# Patient Record
Sex: Female | Born: 1935
Health system: Southern US, Community
[De-identification: ages and names within clinical notes are randomized; demographics above are authoritative.]

## PROBLEM LIST (undated history)

## (undated) DIAGNOSIS — J449 Chronic obstructive pulmonary disease, unspecified: Secondary | ICD-10-CM

## (undated) DIAGNOSIS — F32A Depression, unspecified: Secondary | ICD-10-CM

## (undated) DIAGNOSIS — F329 Major depressive disorder, single episode, unspecified: Secondary | ICD-10-CM

## (undated) DIAGNOSIS — I509 Heart failure, unspecified: Secondary | ICD-10-CM

## (undated) DIAGNOSIS — T4145XA Adverse effect of unspecified anesthetic, initial encounter: Secondary | ICD-10-CM

## (undated) DIAGNOSIS — K5730 Diverticulosis of large intestine without perforation or abscess without bleeding: Secondary | ICD-10-CM

## (undated) DIAGNOSIS — F0390 Unspecified dementia without behavioral disturbance: Secondary | ICD-10-CM

## (undated) DIAGNOSIS — T8859XA Other complications of anesthesia, initial encounter: Secondary | ICD-10-CM

## (undated) DIAGNOSIS — N189 Chronic kidney disease, unspecified: Secondary | ICD-10-CM

## (undated) DIAGNOSIS — I10 Essential (primary) hypertension: Secondary | ICD-10-CM

## (undated) DIAGNOSIS — E785 Hyperlipidemia, unspecified: Secondary | ICD-10-CM

## (undated) DIAGNOSIS — Z8489 Family history of other specified conditions: Secondary | ICD-10-CM

## (undated) DIAGNOSIS — H269 Unspecified cataract: Secondary | ICD-10-CM

## (undated) DIAGNOSIS — T7840XA Allergy, unspecified, initial encounter: Secondary | ICD-10-CM

## (undated) DIAGNOSIS — R55 Syncope and collapse: Secondary | ICD-10-CM

## (undated) DIAGNOSIS — M1990 Unspecified osteoarthritis, unspecified site: Secondary | ICD-10-CM

## (undated) HISTORY — DX: Diverticulosis of large intestine without perforation or abscess without bleeding: K57.30

## (undated) HISTORY — DX: Major depressive disorder, single episode, unspecified: F32.9

## (undated) HISTORY — DX: Essential (primary) hypertension: I10

## (undated) HISTORY — PX: EYE SURGERY: SHX253

## (undated) HISTORY — DX: Chronic kidney disease, unspecified: N18.9

## (undated) HISTORY — DX: Allergy, unspecified, initial encounter: T78.40XA

## (undated) HISTORY — DX: Syncope and collapse: R55

## (undated) HISTORY — DX: Chronic obstructive pulmonary disease, unspecified: J44.9

## (undated) HISTORY — DX: Hyperlipidemia, unspecified: E78.5

## (undated) HISTORY — DX: Heart failure, unspecified: I50.9

## (undated) HISTORY — DX: Depression, unspecified: F32.A

## (undated) HISTORY — DX: Unspecified cataract: H26.9

---

## 1975-08-23 HISTORY — PX: ABDOMINAL HYSTERECTOMY: SHX81

## 1996-02-20 ENCOUNTER — Encounter (INDEPENDENT_AMBULATORY_CARE_PROVIDER_SITE_OTHER): Payer: Self-pay | Admitting: *Deleted

## 1996-02-20 LAB — CONVERTED CEMR LAB

## 1998-01-28 ENCOUNTER — Encounter: Admission: RE | Admit: 1998-01-28 | Discharge: 1998-01-28 | Payer: Self-pay | Admitting: Family Medicine

## 1998-01-28 ENCOUNTER — Other Ambulatory Visit: Admission: RE | Admit: 1998-01-28 | Discharge: 1998-01-28 | Payer: Self-pay | Admitting: Family Medicine

## 1998-02-06 ENCOUNTER — Ambulatory Visit: Admission: RE | Admit: 1998-02-06 | Discharge: 1998-02-06 | Payer: Self-pay | Admitting: Family Medicine

## 1999-06-08 ENCOUNTER — Encounter: Admission: RE | Admit: 1999-06-08 | Discharge: 1999-06-08 | Payer: Self-pay | Admitting: Family Medicine

## 1999-07-14 ENCOUNTER — Encounter: Payer: Self-pay | Admitting: *Deleted

## 1999-07-14 ENCOUNTER — Encounter: Admission: RE | Admit: 1999-07-14 | Discharge: 1999-07-14 | Payer: Self-pay | Admitting: *Deleted

## 2001-05-17 ENCOUNTER — Encounter: Admission: RE | Admit: 2001-05-17 | Discharge: 2001-05-17 | Payer: Self-pay | Admitting: Sports Medicine

## 2001-05-23 ENCOUNTER — Encounter: Admission: RE | Admit: 2001-05-23 | Discharge: 2001-05-23 | Payer: Self-pay | Admitting: *Deleted

## 2001-05-23 ENCOUNTER — Encounter: Payer: Self-pay | Admitting: *Deleted

## 2001-05-31 ENCOUNTER — Encounter: Admission: RE | Admit: 2001-05-31 | Discharge: 2001-05-31 | Payer: Self-pay | Admitting: Family Medicine

## 2002-08-06 ENCOUNTER — Encounter: Admission: RE | Admit: 2002-08-06 | Discharge: 2002-08-06 | Payer: Self-pay | Admitting: Family Medicine

## 2002-08-07 ENCOUNTER — Encounter: Admission: RE | Admit: 2002-08-07 | Discharge: 2002-08-07 | Payer: Self-pay | Admitting: Family Medicine

## 2002-08-27 ENCOUNTER — Encounter: Admission: RE | Admit: 2002-08-27 | Discharge: 2002-08-27 | Payer: Self-pay | Admitting: *Deleted

## 2002-08-27 ENCOUNTER — Encounter: Payer: Self-pay | Admitting: *Deleted

## 2002-09-04 ENCOUNTER — Encounter: Admission: RE | Admit: 2002-09-04 | Discharge: 2002-09-04 | Payer: Self-pay | Admitting: Family Medicine

## 2002-09-10 ENCOUNTER — Encounter: Admission: RE | Admit: 2002-09-10 | Discharge: 2002-09-10 | Payer: Self-pay | Admitting: Family Medicine

## 2002-10-23 ENCOUNTER — Ambulatory Visit (HOSPITAL_COMMUNITY): Admission: RE | Admit: 2002-10-23 | Discharge: 2002-10-23 | Payer: Self-pay | Admitting: Gastroenterology

## 2002-10-23 ENCOUNTER — Encounter (INDEPENDENT_AMBULATORY_CARE_PROVIDER_SITE_OTHER): Payer: Self-pay | Admitting: *Deleted

## 2002-11-18 ENCOUNTER — Encounter: Admission: RE | Admit: 2002-11-18 | Discharge: 2002-11-18 | Payer: Self-pay | Admitting: Family Medicine

## 2003-03-07 ENCOUNTER — Encounter: Admission: RE | Admit: 2003-03-07 | Discharge: 2003-03-07 | Payer: Self-pay | Admitting: Family Medicine

## 2003-06-27 ENCOUNTER — Encounter: Admission: RE | Admit: 2003-06-27 | Discharge: 2003-06-27 | Payer: Self-pay | Admitting: Family Medicine

## 2003-11-28 ENCOUNTER — Encounter: Admission: RE | Admit: 2003-11-28 | Discharge: 2003-11-28 | Payer: Self-pay | Admitting: Family Medicine

## 2003-12-02 ENCOUNTER — Encounter: Admission: RE | Admit: 2003-12-02 | Discharge: 2003-12-02 | Payer: Self-pay | Admitting: Family Medicine

## 2003-12-08 ENCOUNTER — Encounter: Admission: RE | Admit: 2003-12-08 | Discharge: 2003-12-08 | Payer: Self-pay | Admitting: Sports Medicine

## 2003-12-25 ENCOUNTER — Encounter: Admission: RE | Admit: 2003-12-25 | Discharge: 2003-12-25 | Payer: Self-pay | Admitting: Family Medicine

## 2004-02-09 ENCOUNTER — Encounter: Admission: RE | Admit: 2004-02-09 | Discharge: 2004-02-09 | Payer: Self-pay | Admitting: Family Medicine

## 2004-02-20 ENCOUNTER — Encounter: Admission: RE | Admit: 2004-02-20 | Discharge: 2004-02-20 | Payer: Self-pay | Admitting: Family Medicine

## 2004-03-15 ENCOUNTER — Ambulatory Visit (HOSPITAL_COMMUNITY): Admission: RE | Admit: 2004-03-15 | Discharge: 2004-03-15 | Payer: Self-pay | Admitting: General Surgery

## 2004-03-15 ENCOUNTER — Ambulatory Visit (HOSPITAL_BASED_OUTPATIENT_CLINIC_OR_DEPARTMENT_OTHER): Admission: RE | Admit: 2004-03-15 | Discharge: 2004-03-15 | Payer: Self-pay | Admitting: General Surgery

## 2004-03-15 ENCOUNTER — Encounter (INDEPENDENT_AMBULATORY_CARE_PROVIDER_SITE_OTHER): Payer: Self-pay | Admitting: *Deleted

## 2004-08-11 ENCOUNTER — Ambulatory Visit: Payer: Self-pay | Admitting: Sports Medicine

## 2004-08-12 ENCOUNTER — Ambulatory Visit: Payer: Self-pay | Admitting: Sports Medicine

## 2004-08-22 DIAGNOSIS — R55 Syncope and collapse: Secondary | ICD-10-CM

## 2004-08-22 HISTORY — DX: Syncope and collapse: R55

## 2005-06-30 ENCOUNTER — Ambulatory Visit: Payer: Self-pay | Admitting: Family Medicine

## 2005-06-30 ENCOUNTER — Ambulatory Visit (HOSPITAL_COMMUNITY): Admission: RE | Admit: 2005-06-30 | Discharge: 2005-06-30 | Payer: Self-pay | Admitting: Family Medicine

## 2005-07-01 ENCOUNTER — Encounter: Admission: RE | Admit: 2005-07-01 | Discharge: 2005-07-01 | Payer: Self-pay | Admitting: Sports Medicine

## 2005-07-13 ENCOUNTER — Ambulatory Visit: Payer: Self-pay | Admitting: Family Medicine

## 2005-07-21 ENCOUNTER — Ambulatory Visit: Payer: Self-pay | Admitting: Sports Medicine

## 2006-08-16 ENCOUNTER — Encounter: Admission: RE | Admit: 2006-08-16 | Discharge: 2006-08-16 | Payer: Self-pay

## 2006-08-30 ENCOUNTER — Ambulatory Visit: Payer: Self-pay | Admitting: Family Medicine

## 2006-09-04 ENCOUNTER — Ambulatory Visit: Payer: Self-pay | Admitting: Family Medicine

## 2006-09-04 ENCOUNTER — Encounter (INDEPENDENT_AMBULATORY_CARE_PROVIDER_SITE_OTHER): Payer: Self-pay | Admitting: Family Medicine

## 2006-09-04 LAB — CONVERTED CEMR LAB
AST: 11 units/L (ref 0–37)
Albumin: 3.7 g/dL (ref 3.5–5.2)
Alkaline Phosphatase: 87 units/L (ref 39–117)
BUN: 22 mg/dL (ref 6–23)
Calcium: 9.2 mg/dL (ref 8.4–10.5)
Chloride: 104 meq/L (ref 96–112)
Glucose, Bld: 117 mg/dL — ABNORMAL HIGH (ref 70–99)
HDL: 57 mg/dL (ref >39)
LDL Cholesterol: 175 mg/dL — ABNORMAL HIGH (ref 0–99)
Potassium: 4.8 meq/L (ref 3.5–5.3)
Sodium: 141 meq/L (ref 135–145)
Total CHOL/HDL Ratio: 4.7
Total Protein: 6.5 g/dL (ref 6.0–8.3)
VLDL: 38 mg/dL (ref 0–40)

## 2006-09-29 ENCOUNTER — Ambulatory Visit: Payer: Self-pay | Admitting: Family Medicine

## 2006-10-19 DIAGNOSIS — D126 Benign neoplasm of colon, unspecified: Secondary | ICD-10-CM | POA: Insufficient documentation

## 2006-10-19 DIAGNOSIS — E1140 Type 2 diabetes mellitus with diabetic neuropathy, unspecified: Secondary | ICD-10-CM | POA: Insufficient documentation

## 2006-10-19 DIAGNOSIS — E1169 Type 2 diabetes mellitus with other specified complication: Secondary | ICD-10-CM | POA: Insufficient documentation

## 2006-10-19 DIAGNOSIS — E669 Obesity, unspecified: Secondary | ICD-10-CM

## 2006-10-19 DIAGNOSIS — E0843 Diabetes mellitus due to underlying condition with diabetic autonomic (poly)neuropathy: Secondary | ICD-10-CM

## 2006-10-19 DIAGNOSIS — L508 Other urticaria: Secondary | ICD-10-CM | POA: Insufficient documentation

## 2006-10-19 DIAGNOSIS — F339 Major depressive disorder, recurrent, unspecified: Secondary | ICD-10-CM

## 2006-10-19 DIAGNOSIS — I10 Essential (primary) hypertension: Secondary | ICD-10-CM

## 2006-10-19 DIAGNOSIS — E1159 Type 2 diabetes mellitus with other circulatory complications: Secondary | ICD-10-CM | POA: Insufficient documentation

## 2006-10-19 DIAGNOSIS — I152 Hypertension secondary to endocrine disorders: Secondary | ICD-10-CM | POA: Insufficient documentation

## 2006-10-19 DIAGNOSIS — E785 Hyperlipidemia, unspecified: Secondary | ICD-10-CM

## 2006-10-20 ENCOUNTER — Encounter (INDEPENDENT_AMBULATORY_CARE_PROVIDER_SITE_OTHER): Payer: Self-pay | Admitting: *Deleted

## 2006-12-04 ENCOUNTER — Ambulatory Visit: Payer: Self-pay | Admitting: Family Medicine

## 2007-03-19 ENCOUNTER — Telehealth (INDEPENDENT_AMBULATORY_CARE_PROVIDER_SITE_OTHER): Payer: Self-pay | Admitting: *Deleted

## 2007-04-16 ENCOUNTER — Ambulatory Visit: Payer: Self-pay | Admitting: Sports Medicine

## 2007-04-16 LAB — CONVERTED CEMR LAB: Hgb A1c MFr Bld: 6.4 %

## 2007-05-16 ENCOUNTER — Encounter: Admission: RE | Admit: 2007-05-16 | Discharge: 2007-05-16 | Payer: Self-pay | Admitting: Sports Medicine

## 2007-05-16 ENCOUNTER — Ambulatory Visit: Payer: Self-pay | Admitting: Family Medicine

## 2007-06-26 ENCOUNTER — Ambulatory Visit: Payer: Self-pay | Admitting: Family Medicine

## 2007-07-23 ENCOUNTER — Ambulatory Visit: Payer: Self-pay | Admitting: Family Medicine

## 2007-07-23 ENCOUNTER — Encounter (INDEPENDENT_AMBULATORY_CARE_PROVIDER_SITE_OTHER): Payer: Self-pay | Admitting: Family Medicine

## 2007-07-24 LAB — CONVERTED CEMR LAB
ALT: 10 units/L (ref 0–35)
AST: 13 units/L (ref 0–37)
Albumin: 3.9 g/dL (ref 3.5–5.2)
Alkaline Phosphatase: 67 units/L (ref 39–117)
BUN: 22 mg/dL (ref 6–23)
CO2: 22 meq/L (ref 19–32)
Calcium: 9.3 mg/dL (ref 8.4–10.5)
Chloride: 109 meq/L (ref 96–112)
Cholesterol: 195 mg/dL (ref 0–200)
Creatinine, Ser: 1.24 mg/dL — ABNORMAL HIGH (ref 0.40–1.20)
Glucose, Bld: 104 mg/dL — ABNORMAL HIGH (ref 70–99)
HDL: 46 mg/dL (ref >39)
LDL Cholesterol: 79 mg/dL (ref 0–99)
Potassium: 5 meq/L (ref 3.5–5.3)
Sodium: 139 meq/L (ref 135–145)
Total Bilirubin: 0.4 mg/dL (ref 0.3–1.2)
Total CHOL/HDL Ratio: 4.2
Total Protein: 6.9 g/dL (ref 6.0–8.3)
Triglycerides: 348 mg/dL — ABNORMAL HIGH (ref <150)
VLDL: 70 mg/dL — ABNORMAL HIGH (ref 0–40)

## 2007-07-27 ENCOUNTER — Ambulatory Visit: Payer: Self-pay | Admitting: Family Medicine

## 2007-08-23 HISTORY — PX: JOINT REPLACEMENT: SHX530

## 2007-10-29 ENCOUNTER — Encounter: Admission: RE | Admit: 2007-10-29 | Discharge: 2007-10-29 | Payer: Self-pay | Admitting: Family Medicine

## 2007-12-04 ENCOUNTER — Encounter (INDEPENDENT_AMBULATORY_CARE_PROVIDER_SITE_OTHER): Payer: Self-pay | Admitting: Family Medicine

## 2007-12-04 LAB — CONVERTED CEMR LAB
AST: 15 units/L
Albumin: 4.1 g/dL
Alkaline Phosphatase: 76 units/L
BUN: 25 mg/dL
CO2, serum: 21 mmol/L
Chloride, Serum: 108 mmol/L
Creatinine, Ser: 1.32 mg/dL
Glucose, Bld: 100 mg/dL
HCT: 39.6 %
MCV: 92.3 fL
Potassium, serum: 5.1 mmol/L
RDW: 13.7 %
Total Bilirubin: 0.3 mg/dL
Total Protein: 7.2 g/dL
WBC, blood: 10.6 10*3/uL
platelet count: 323 10*3/uL

## 2007-12-10 ENCOUNTER — Encounter (INDEPENDENT_AMBULATORY_CARE_PROVIDER_SITE_OTHER): Payer: Self-pay | Admitting: Family Medicine

## 2008-02-01 ENCOUNTER — Encounter (INDEPENDENT_AMBULATORY_CARE_PROVIDER_SITE_OTHER): Payer: Self-pay | Admitting: Family Medicine

## 2008-02-01 DIAGNOSIS — K5730 Diverticulosis of large intestine without perforation or abscess without bleeding: Secondary | ICD-10-CM | POA: Insufficient documentation

## 2008-02-11 ENCOUNTER — Ambulatory Visit: Payer: Self-pay | Admitting: Sports Medicine

## 2008-02-11 DIAGNOSIS — N184 Chronic kidney disease, stage 4 (severe): Secondary | ICD-10-CM

## 2008-02-11 LAB — CONVERTED CEMR LAB: Hgb A1c MFr Bld: 5.9 %

## 2008-02-12 ENCOUNTER — Ambulatory Visit: Payer: Self-pay | Admitting: Family Medicine

## 2008-02-12 ENCOUNTER — Encounter (INDEPENDENT_AMBULATORY_CARE_PROVIDER_SITE_OTHER): Payer: Self-pay | Admitting: Family Medicine

## 2008-02-13 ENCOUNTER — Encounter: Admission: RE | Admit: 2008-02-13 | Discharge: 2008-02-13 | Payer: Self-pay | Admitting: Family Medicine

## 2008-02-13 ENCOUNTER — Telehealth (INDEPENDENT_AMBULATORY_CARE_PROVIDER_SITE_OTHER): Payer: Self-pay | Admitting: Family Medicine

## 2008-02-13 ENCOUNTER — Ambulatory Visit: Payer: Self-pay | Admitting: Family Medicine

## 2008-02-13 ENCOUNTER — Ambulatory Visit (HOSPITAL_COMMUNITY): Admission: RE | Admit: 2008-02-13 | Discharge: 2008-02-13 | Payer: Self-pay | Admitting: Family Medicine

## 2008-02-13 ENCOUNTER — Encounter (INDEPENDENT_AMBULATORY_CARE_PROVIDER_SITE_OTHER): Payer: Self-pay | Admitting: *Deleted

## 2008-02-13 DIAGNOSIS — E875 Hyperkalemia: Secondary | ICD-10-CM

## 2008-02-14 LAB — CONVERTED CEMR LAB
BUN: 26 mg/dL — ABNORMAL HIGH (ref 6–23)
CO2: 20 meq/L (ref 19–32)
Calcium: 9.3 mg/dL (ref 8.4–10.5)
Chloride: 111 meq/L (ref 96–112)
Creatinine, Ser: 1.19 mg/dL (ref 0.40–1.20)
Potassium: 5.4 meq/L — ABNORMAL HIGH (ref 3.5–5.3)
Sodium: 141 meq/L (ref 135–145)

## 2008-02-18 LAB — CONVERTED CEMR LAB
ALT: <8 units/L (ref 0–35)
AST: 13 units/L (ref 0–37)
CO2: 19 meq/L (ref 19–32)
Calcium, Total (PTH): 9.1 mg/dL (ref 8.4–10.5)
Calcium: 9.1 mg/dL (ref 8.4–10.5)
Chloride: 114 meq/L — ABNORMAL HIGH (ref 96–112)
Cholesterol: 220 mg/dL — ABNORMAL HIGH (ref 0–200)
Magnesium: 1.6 mg/dL (ref 1.5–2.5)
Phosphorus: 3.6 mg/dL (ref 2.3–4.6)
Sodium: 141 meq/L (ref 135–145)
TSH: 1.929 microintl units/mL (ref 0.350–5.50)
Total Bilirubin: 0.3 mg/dL (ref 0.3–1.2)
Total Protein: 6.5 g/dL (ref 6.0–8.3)
VLDL: 42 mg/dL — ABNORMAL HIGH (ref 0–40)

## 2008-02-19 ENCOUNTER — Encounter (INDEPENDENT_AMBULATORY_CARE_PROVIDER_SITE_OTHER): Payer: Self-pay | Admitting: Family Medicine

## 2008-02-19 ENCOUNTER — Ambulatory Visit: Payer: Self-pay | Admitting: Family Medicine

## 2008-02-20 ENCOUNTER — Telehealth (INDEPENDENT_AMBULATORY_CARE_PROVIDER_SITE_OTHER): Payer: Self-pay | Admitting: *Deleted

## 2008-02-21 LAB — CONVERTED CEMR LAB
BUN: 21 mg/dL (ref 6–23)
CO2: 22 meq/L (ref 19–32)
Calcium: 9.5 mg/dL (ref 8.4–10.5)
Chloride: 110 meq/L (ref 96–112)
Creatinine, Ser: 1.24 mg/dL — ABNORMAL HIGH (ref 0.40–1.20)
Glucose, Bld: 131 mg/dL — ABNORMAL HIGH (ref 70–99)
Sodium: 141 meq/L (ref 135–145)
Vit D, 1,25-Dihydroxy: 19 — ABNORMAL LOW (ref 30–89)

## 2008-03-05 ENCOUNTER — Encounter (INDEPENDENT_AMBULATORY_CARE_PROVIDER_SITE_OTHER): Payer: Self-pay | Admitting: Family Medicine

## 2008-03-05 ENCOUNTER — Ambulatory Visit: Payer: Self-pay | Admitting: Family Medicine

## 2008-03-06 ENCOUNTER — Encounter (INDEPENDENT_AMBULATORY_CARE_PROVIDER_SITE_OTHER): Payer: Self-pay | Admitting: Family Medicine

## 2008-03-06 ENCOUNTER — Ambulatory Visit: Payer: Self-pay | Admitting: Family Medicine

## 2008-03-06 LAB — CONVERTED CEMR LAB
BUN: 16 mg/dL (ref 6–23)
CO2: 21 meq/L (ref 19–32)
Calcium: 9.4 mg/dL (ref 8.4–10.5)
Chloride: 108 meq/L (ref 96–112)
Creatinine, Ser: 1.06 mg/dL (ref 0.40–1.20)
Glucose, Bld: 91 mg/dL (ref 70–99)
Potassium: 5.7 meq/L — ABNORMAL HIGH (ref 3.5–5.3)
Sodium: 142 meq/L (ref 135–145)

## 2008-03-07 LAB — CONVERTED CEMR LAB
BUN: 16 mg/dL (ref 6–23)
CO2: 21 meq/L (ref 19–32)
Calcium: 9.4 mg/dL (ref 8.4–10.5)
Chloride: 103 meq/L (ref 96–112)
Creatinine, Ser: 1.09 mg/dL (ref 0.40–1.20)
Glucose, Bld: 100 mg/dL — ABNORMAL HIGH (ref 70–99)
Potassium: 5.2 meq/L (ref 3.5–5.3)
Sodium: 139 meq/L (ref 135–145)

## 2008-07-01 ENCOUNTER — Ambulatory Visit: Payer: Self-pay | Admitting: Family Medicine

## 2008-07-01 ENCOUNTER — Encounter (INDEPENDENT_AMBULATORY_CARE_PROVIDER_SITE_OTHER): Payer: Self-pay | Admitting: Family Medicine

## 2008-07-01 ENCOUNTER — Encounter: Payer: Self-pay | Admitting: Family Medicine

## 2008-07-01 ENCOUNTER — Encounter: Admission: RE | Admit: 2008-07-01 | Discharge: 2008-07-01 | Payer: Self-pay | Admitting: Family Medicine

## 2008-07-01 DIAGNOSIS — E559 Vitamin D deficiency, unspecified: Secondary | ICD-10-CM

## 2008-07-01 DIAGNOSIS — M25559 Pain in unspecified hip: Secondary | ICD-10-CM

## 2008-07-01 LAB — CONVERTED CEMR LAB: Hgb A1c MFr Bld: 6.1 %

## 2008-07-03 LAB — CONVERTED CEMR LAB
ALT: 8 units/L (ref 0–35)
AST: 14 units/L (ref 0–37)
Albumin: 4 g/dL (ref 3.5–5.2)
Alkaline Phosphatase: 79 units/L (ref 39–117)
BUN: 21 mg/dL (ref 6–23)
CO2: 19 meq/L (ref 19–32)
Calcium: 9.9 mg/dL (ref 8.4–10.5)
Chloride: 110 meq/L (ref 96–112)
Creatinine, Ser: 1.14 mg/dL (ref 0.40–1.20)
Glucose, Bld: 86 mg/dL (ref 70–99)
Potassium: 4.8 meq/L (ref 3.5–5.3)
Sodium: 140 meq/L (ref 135–145)
Total Bilirubin: 0.3 mg/dL (ref 0.3–1.2)
Total Protein: 7.4 g/dL (ref 6.0–8.3)

## 2008-07-04 ENCOUNTER — Telehealth (INDEPENDENT_AMBULATORY_CARE_PROVIDER_SITE_OTHER): Payer: Self-pay | Admitting: Family Medicine

## 2008-08-18 ENCOUNTER — Encounter (INDEPENDENT_AMBULATORY_CARE_PROVIDER_SITE_OTHER): Payer: Self-pay | Admitting: Family Medicine

## 2008-08-18 ENCOUNTER — Ambulatory Visit: Payer: Self-pay | Admitting: Family Medicine

## 2008-08-19 LAB — CONVERTED CEMR LAB: Direct LDL: 152 mg/dL — ABNORMAL HIGH

## 2008-10-23 ENCOUNTER — Ambulatory Visit (HOSPITAL_COMMUNITY): Admission: RE | Admit: 2008-10-23 | Discharge: 2008-10-23 | Payer: Self-pay | Admitting: Family Medicine

## 2008-10-23 ENCOUNTER — Encounter (INDEPENDENT_AMBULATORY_CARE_PROVIDER_SITE_OTHER): Payer: Self-pay | Admitting: Family Medicine

## 2008-10-23 ENCOUNTER — Ambulatory Visit: Payer: Self-pay | Admitting: Family Medicine

## 2008-10-23 DIAGNOSIS — R42 Dizziness and giddiness: Secondary | ICD-10-CM

## 2008-10-23 LAB — CONVERTED CEMR LAB
ALT: <8 units/L (ref 0–35)
AST: 12 units/L (ref 0–37)
Albumin: 4.3 g/dL (ref 3.5–5.2)
BUN: 25 mg/dL — ABNORMAL HIGH (ref 6–23)
CO2: 24 meq/L (ref 19–32)
Calcium: 9.9 mg/dL (ref 8.4–10.5)
Chloride: 105 meq/L (ref 96–112)
Creatinine, Ser: 1.35 mg/dL — ABNORMAL HIGH (ref 0.40–1.20)
Direct LDL: 140 mg/dL — ABNORMAL HIGH
Glucose, Bld: 96 mg/dL (ref 70–99)
Hgb A1c MFr Bld: 6 %
Potassium: 4.8 meq/L (ref 3.5–5.3)
Sodium: 142 meq/L (ref 135–145)
Total Bilirubin: 0.3 mg/dL (ref 0.3–1.2)
Total Protein: 7 g/dL (ref 6.0–8.3)

## 2008-10-24 ENCOUNTER — Encounter (INDEPENDENT_AMBULATORY_CARE_PROVIDER_SITE_OTHER): Payer: Self-pay | Admitting: Family Medicine

## 2008-10-27 ENCOUNTER — Ambulatory Visit: Payer: Self-pay | Admitting: Cardiology

## 2008-10-27 DIAGNOSIS — R55 Syncope and collapse: Secondary | ICD-10-CM

## 2008-10-27 DIAGNOSIS — R079 Chest pain, unspecified: Secondary | ICD-10-CM | POA: Insufficient documentation

## 2008-11-06 ENCOUNTER — Telehealth (INDEPENDENT_AMBULATORY_CARE_PROVIDER_SITE_OTHER): Payer: Self-pay

## 2008-11-10 ENCOUNTER — Ambulatory Visit: Payer: Self-pay

## 2008-11-10 ENCOUNTER — Ambulatory Visit: Payer: Self-pay | Admitting: Cardiology

## 2008-11-10 ENCOUNTER — Encounter: Payer: Self-pay | Admitting: Cardiology

## 2008-11-10 LAB — CONVERTED CEMR LAB
Cholesterol: 237 mg/dL — ABNORMAL HIGH (ref 0–200)
HDL: 60.2 mg/dL (ref >39.00)
TSH: 1.95 microintl units/mL (ref 0.35–5.50)
Total CHOL/HDL Ratio: 4
Triglycerides: 168 mg/dL — ABNORMAL HIGH (ref 0.0–149.0)

## 2008-11-18 ENCOUNTER — Encounter: Payer: Self-pay | Admitting: Cardiology

## 2008-11-18 ENCOUNTER — Ambulatory Visit: Payer: Self-pay | Admitting: Cardiology

## 2008-12-17 ENCOUNTER — Ambulatory Visit: Payer: Self-pay | Admitting: Family Medicine

## 2008-12-17 DIAGNOSIS — J309 Allergic rhinitis, unspecified: Secondary | ICD-10-CM | POA: Insufficient documentation

## 2009-01-01 ENCOUNTER — Telehealth: Payer: Self-pay | Admitting: Cardiology

## 2009-01-23 ENCOUNTER — Inpatient Hospital Stay (HOSPITAL_COMMUNITY): Admission: RE | Admit: 2009-01-23 | Discharge: 2009-01-26 | Payer: Self-pay | Admitting: Orthopedic Surgery

## 2009-06-05 ENCOUNTER — Encounter: Payer: Self-pay | Admitting: Family Medicine

## 2010-05-18 ENCOUNTER — Ambulatory Visit: Payer: Self-pay | Admitting: Family Medicine

## 2010-05-18 ENCOUNTER — Encounter: Payer: Self-pay | Admitting: Family Medicine

## 2010-05-18 DIAGNOSIS — R5383 Other fatigue: Secondary | ICD-10-CM

## 2010-05-18 DIAGNOSIS — R5381 Other malaise: Secondary | ICD-10-CM | POA: Insufficient documentation

## 2010-05-18 LAB — CONVERTED CEMR LAB: Hgb A1c MFr Bld: 6.4 %

## 2010-05-21 ENCOUNTER — Encounter: Payer: Self-pay | Admitting: Family Medicine

## 2010-05-21 LAB — CONVERTED CEMR LAB
ALT: 9 units/L (ref 0–35)
AST: 12 units/L (ref 0–37)
Albumin: 3.9 g/dL (ref 3.5–5.2)
BUN: 26 mg/dL — ABNORMAL HIGH (ref 6–23)
CO2: 26 meq/L (ref 19–32)
Calcium: 10 mg/dL (ref 8.4–10.5)
Chloride: 101 meq/L (ref 96–112)
Cholesterol: 289 mg/dL — ABNORMAL HIGH (ref 0–200)
Creatinine, Ser: 1.12 mg/dL (ref 0.40–1.20)
Glucose, Bld: 93 mg/dL (ref 70–99)
LDL Cholesterol: 171 mg/dL — ABNORMAL HIGH (ref 0–99)
MCHC: 32.7 g/dL (ref 30.0–36.0)
Platelets: 359 10*3/uL (ref 150–400)
RBC: 4.46 M/uL (ref 3.87–5.11)
Sodium: 140 meq/L (ref 135–145)
TSH: 2.563 microintl units/mL (ref 0.350–4.500)
Total Bilirubin: 0.2 mg/dL — ABNORMAL LOW (ref 0.3–1.2)
Total CHOL/HDL Ratio: 4.3
Total Protein: 6.4 g/dL (ref 6.0–8.3)
Triglycerides: 248 mg/dL — ABNORMAL HIGH (ref <150)
VLDL: 50 mg/dL — ABNORMAL HIGH (ref 0–40)
WBC: 10.4 10*3/uL (ref 4.0–10.5)

## 2010-05-31 ENCOUNTER — Telehealth: Payer: Self-pay | Admitting: Family Medicine

## 2010-06-07 ENCOUNTER — Encounter: Admission: RE | Admit: 2010-06-07 | Discharge: 2010-06-07 | Payer: Self-pay | Admitting: Orthopedic Surgery

## 2010-06-22 ENCOUNTER — Encounter (INDEPENDENT_AMBULATORY_CARE_PROVIDER_SITE_OTHER): Payer: Self-pay | Admitting: Pharmacist

## 2010-07-16 ENCOUNTER — Encounter: Admission: RE | Admit: 2010-07-16 | Discharge: 2010-07-16 | Payer: Self-pay | Admitting: Family Medicine

## 2010-07-26 ENCOUNTER — Encounter: Payer: Self-pay | Admitting: Family Medicine

## 2010-09-21 NOTE — Progress Notes (Signed)
  Medications Added CITALOPRAM HYDROBROMIDE 40 MG TABS (CITALOPRAM HYDROBROMIDE) 1 tablet by mouth daily       Phone Note Refill Request Call back at [phone] Message from:  Patient  Refills Requested: Medication #1:  CITALOPRAM HYDROBROMIDE 40 MG TABS 1 tablet by mouth daily - this is a new dose  Medication #2:  FENOFIBRATE 54 MG TABS 2 tablets by mouth with dinner daily - this is an increased dose  Medication #3:  METFORMIN HCL 1000 MG TABS Take 1 tablet by mouth twice a day  Medication #4:  TRAMADOL HCL 50 MG TABS 1/2 tablet by mouth at bedtime as needed for hip pain Please call to Pharmacy   Initial call taken by: Eusebio Friendly,  May 31, 2010 11:17 AM  Follow-up for Phone Call        will forward to MD. Follow-up by: Marcell Barlow RN,  May 31, 2010 11:51 AM    New/Updated Medications: CITALOPRAM HYDROBROMIDE 40 MG TABS (CITALOPRAM HYDROBROMIDE) 1 tablet by mouth daily Prescriptions: METFORMIN HCL 1000 MG TABS (METFORMIN HCL) Take 1 tablet by mouth twice a day  #180 x 4   Entered and Authorized by:   Suzanna Obey MD   Signed by:   Suzanna Obey MD on 06/01/2010   Method used:   Electronically to        [facility] (retail)       [place], [place]  [id number]       Ph: [phone]       Fax: [phone]   RxIDRF:[id number] CITALOPRAM HYDROBROMIDE 40 MG TABS (CITALOPRAM HYDROBROMIDE) 1 tablet by mouth daily  #90 x 4   Entered and Authorized by:   Suzanna Obey MD   Signed by:   Suzanna Obey MD on 06/01/2010   Method used:   Electronically to        [facility] (retail)       [place], [place]  [id number]       Ph: [phone]       Fax: [phone]   RxIDBP:[id number] TRAMADOL HCL 50 MG TABS (TRAMADOL HCL) 1/2 tablet by mouth at bedtime as needed for hip pain  #30 x 1   Entered and Authorized by:   Suzanna Obey MD   Signed by:   Suzanna Obey MD on  06/01/2010   Method used:   Electronically to        [facility] (retail)       [place], [place]  [id number]       Ph: [phone]       Fax: [phone]   RxIDMP:[id number] FENOFIBRATE 54 MG TABS (FENOFIBRATE) 2 tablets by mouth with dinner daily - this is an increased dose  #180 x 4   Entered and Authorized by:   Suzanna Obey MD   Signed by:   Suzanna Obey MD on 06/01/2010   Method used:   Electronically to        [facility] (retail)       [place], [place]  [id number]       Ph: [phone]       Fax: [phone]   RxIDPT:[id number]

## 2010-09-21 NOTE — Assessment & Plan Note (Signed)
Summary: f/u,tcb   Vital Signs:  Patient profile:   77 year old female Height:      63 inches Weight:      162.8 pounds Pulse rate:   88 / minute BP sitting:   139 / 74  (right arm) Cuff size:   regular  Vitals Entered By: Mauricia Area CMA, (May 18, 2010 9:02 AM) CC: f/up DM. PE and refill meds. Is Patient Diabetic? Yes Pain Assessment Patient in pain? yes     Location: right hip Intensity: 5   Primary Care Provider:  Suzanna Obey MD  CC:  f/up DM. PE and refill meds..  History of Present Illness: 77 yo here for f/u.  My first time meeting her.  Has not been to the office since 2010.  DIABETES Meds: Metformin 1000 BID Taking and tolerating? yes Blood sugars: highest 150 Hypoglycemic symptoms: no Visual problems: hx of detached retina 1 year ago and catarct removal. Monitoring feet: yes Numbness/Tingling:  no Last eye exam: regular eye care A1c: 6.4% Flu/PNA vaccines? Needs flu shot.  Depression:  feels it is at the "right level"  Has beenon the same dose for some time.    HYPERTENSION Meds: Taking and tolerating? yes Home BP's: no Chest Pain: no Dyspnea: no     Habits & Providers  Alcohol-Tobacco-Diet     Tobacco Status: quit > 6 months  Current Medications (verified): 1)  Bayer Childrens Aspirin 81 Mg Chew (Aspirin) .... Take 1 Tablet By Mouth Once A Day 2)  Citalopram Hydrobromide 40 Mg Tabs (Citalopram Hydrobromide) .Marland Kitchen.. 1 Tablet By Mouth Daily - This Is A New Dose 3)  Fenofibrate 54 Mg Tabs (Fenofibrate) .... 2 Tablets By Mouth With Dinner Daily - This Is An Increased Dose 4)  Metformin Hcl 1000 Mg Tabs (Metformin Hcl) .... Take 1 Tablet By Mouth Twice A Day 5)  Tramadol Hcl 50 Mg Tabs (Tramadol Hcl) .... 1/2 Tablet By Mouth At Bedtime As Needed For Hip Pain 6)  Vitamin D3 1000 Unit Caps (Cholecalciferol) .Marland Kitchen.. 1 Tablet By Mouth Daily 7)  One Touch Test Strips For Glucometer .... Use As Directed Two To Three Times A Day - Dispense 1  Box  Allergies: 1)  ! Mevacor 2)  ! Niacin 3)  * Statins PMH-FH-SH reviewed for relevance  Social History: Smoking Status:  quit > 6 months  Review of Systems      See HPI General:  Neg except as otherwise ntoed.Marland Kitchen  Physical Exam  General:  Well-developed, well-nourished, no acute distress.  Alert and oriented x 3.  Lungs:  Clear to auscultation bilaterally.  Normal work of breathing.  No wheezes, rales, or rhonchi.  Heart:  Regular rate and rhythm.  No murmur, rub, or gallop.  2+ Dorsalis Pedis pulses.  Abdomen:  Bowel sounds positive,abdomen soft and non-tender without masses, organomegaly or hernias noted. Extremities:  No clubbing, cyanosis, or edema.  Neurologic:  alert & oriented X3.     Impression & Recommendations:  Problem # 1:  DIABETES MELLITUS II, UNCOMPLICATED ([id number]) At goal.  Her updated medication list for this problem includes:    Bayer Childrens Aspirin 81 Mg Chew (Aspirin) .Marland Kitchen... Take 1 tablet by mouth once a day    Metformin Hcl 1000 Mg Tabs (Metformin hcl) .Marland Kitchen... Take 1 tablet by mouth twice a day  Orders: A1C-[facility] [id number]) [place]- Est  Level 4 [id number])  Problem # 2:  HYPERTENSION, BENIGN SYSTEMIC (ICD-401.1) Improved.  Minimally above goal.  Given patient info and  instruction to check ambulatroy BP and if continues to be above goal, to return to discuss  Orders: Comp Met-[facility] [phone]) CBC-[facility] [id number]) [place]- Est  Level 4 (99214)  BP today: 139/74 Prior BP: 161/83 (12/17/2008)  Prior 10 Yr Risk Heart Disease: 17 % (11/10/2008)  Labs Reviewed: K+: 4.8 (10/23/2008) Creat: : 1.35 (10/23/2008)   Chol: 237 (11/10/2008)   HDL: 60.20 (11/10/2008)   LDL: 126 (02/12/2008)   TG: 168.0 (11/10/2008)  Problem # 3:  VITAMIN D DEFICIENCY (ICD-268.9)  Will recheck  Orders: Vit D, 25 OH-[facility] [id number]) [place]- Est  Level 4 [id number])  Problem # 4:  HYPERLIPIDEMIA (ICD-272.4) statin intolerant.  Will rechekc lipids.  Her updated medication list for this  problem includes:    Fenofibrate 54 Mg Tabs (Fenofibrate) .Marland Kitchen... 2 tablets by mouth with dinner daily - this is an increased dose  Orders: Lipid-[facility] [id number]) [place]- Est  Level 4 [id number])  Complete Medication List: 1)  Bayer Childrens Aspirin 81 Mg Chew (Aspirin) .... Take 1 tablet by mouth once a day 2)  Citalopram Hydrobromide 40 Mg Tabs (Citalopram hydrobromide) .Marland Kitchen.. 1 tablet by mouth daily - this is a new dose 3)  Fenofibrate 54 Mg Tabs (Fenofibrate) .... 2 tablets by mouth with dinner daily - this is an increased dose 4)  Metformin Hcl 1000 Mg Tabs (Metformin hcl) .... Take 1 tablet by mouth twice a day 5)  Tramadol Hcl 50 Mg Tabs (Tramadol hcl) .... 1/2 tablet by mouth at bedtime as needed for hip pain 6)  Vitamin D3 1000 Unit Caps (Cholecalciferol) .Marland Kitchen.. 1 tablet by mouth daily 7)  One Touch Test Strips For Glucometer  .... Use as directed two to three times a day - dispense 1 box  Other Orders: TSH-[facility] [id number])  Patient Instructions: 1)  Your blood pressure goal is 130/80.  If you find your blood pressure is above this consistantly, please make follow-up to discuss. 2)  Remember to make your mammogram appt and flu shot. 3)  I will call you for unexpected results, or otherwise send you a letter in the mail.  if you do not hear anythign in 2 weeks, please give office a call. 4)  Follow-up in 1 year or sooner if needed.   Prevention & Chronic Care Immunizations   Influenza vaccine: Office supply out  (07/01/2008)   Influenza vaccine due: 07/01/2009    Tetanus booster: 02/20/2003: Done.   Tetanus booster due: 02/19/2013    Pneumococcal vaccine: Done.  (01/20/1997)   Pneumococcal vaccine due: None    H. zoster vaccine: 07/01/2008: Prescription Given  Colorectal Screening   Hemoccult: Done.  (08/22/2002)   Hemoccult due: Not Indicated    Colonoscopy: normal  (01/30/2008)   Colonoscopy due: 01/29/2018  Other Screening   Pap smear: Done.  (02/20/1996)   Pap smear  action/deferral: hysterectomy  (07/27/2007)   Pap smear due: Not Indicated    Mammogram: normal  (10/29/2007)   Mammogram due: 10/28/2008    DXA bone density scan: normal  (02/13/2008)   DXA scan due: None    Smoking status: quit > 6 months  (05/18/2010)  Diabetes Mellitus   HgbA1C: 6.4  (05/18/2010)   Hemoglobin A1C due: 01/23/2009    Eye exam: normal  (12/21/2007)   Eye exam due: 12/20/2008    Foot exam: yes  (07/01/2008)   High risk foot: Not documented   Foot care education: completed  (07/01/2008)   Foot exam due: 07/01/2009    Urine microalbumin/creatinine ratio: Not documented  Urine microalbumin/cr due: 02/10/2009    Diabetes flowsheet reviewed?: Yes   Progress toward A1C goal: At goal  Lipids   Total Cholesterol: 237  (11/10/2008)   LDL: 126  (02/12/2008)   LDL Direct: 137.6  (11/10/2008)   HDL: 60.20  (11/10/2008)   Triglycerides: 168.0  (11/10/2008)    SGOT (AST): 12  (10/23/2008)   SGPT (ALT): <8 U/L  (10/23/2008) CMP ordered    Alkaline phosphatase: 45  (10/23/2008)   Total bilirubin: 0.3  (10/23/2008)    Lipid flowsheet reviewed?: Yes   Progress toward LDL goal: Unchanged  Hypertension   Last Blood Pressure: 139 / 74  (05/18/2010)   Serum creatinine: 1.35  (10/23/2008)   Serum potassium 4.8  (10/23/2008) CMP ordered     Hypertension flowsheet reviewed?: Yes   Progress toward BP goal: Improved  Self-Management Support :   Personal Goals (by the next clinic visit) :     Personal A1C goal: 7  (05/18/2010)     Personal blood pressure goal: 130/80  (05/18/2010)   Patient will work on the following items until the next clinic visit to reach self-care goals:     Medications and monitoring: take my medicines every day  (05/18/2010)    Diabetes self-management support: Not documented    Hypertension self-management support: Education handout  (05/18/2010)   Hypertension education handout printed    Lipid self-management support: Education  handout  (05/18/2010)     Lipid education handout printed  Laboratory Results   Blood Tests   Date/Time Received: May 18, 2010 9:09 AM  Date/Time Reported: May 18, 2010 9:34 AM   HGBA1C: 6.4%   (Normal Range: Non-Diabetic - 3-6%   Control Diabetic - 6-8%)  Comments: ...........test performed by...........Marland KitchenHedy Camara, CMA

## 2010-09-21 NOTE — Miscellaneous (Signed)
Summary: Orders Update   Clinical Lists Changes  Problems: Added new problem of ENCOUNTER FOR LONG-TERM USE OF OTHER MEDICATIONS (ICD-V58.69) Orders: Added new Test order of B12-[facility] [id number]) - Signed Added new Test order of CBC-[facility] [id number]) - Signed  OK per Dr. Doreene Nest

## 2010-09-21 NOTE — Letter (Signed)
Summary: Lipid Letter  [facility]  [address]   [place], [place] [address]   Phone: [phone]  Fax: [phone]    05/21/2010  Jill [name] [address], [place]  [id number]  Dear Ms. Vonna Drafts:  We have carefully reviewed your last lipid profile from 05/18/2010 and the results are noted below with a summary of recommendations for lipid management.    Cholesterol:       289     Goal: <200   HDL "good" Cholesterol:   68     Goal: >50   LDL "bad" Cholesterol:   171     Goal: <130   Triglycerides:       248     Goal: <150    You are intolerant to many of the medicatiosn we use to treat cholesterol.  It is thus eve mroe important to take your fenofibrate and work on TLC as noted below.  I'd be glad to discuss your cholesterol management in further detail in the office at your convenience.    TLC Diet (Therapeutic Lifestyle Change): Saturated Fats & Transfatty acids should be kept < 7% of total calories ***Reduce Saturated Fats Polyunstaurated Fat can be up to 10% of total calories Monounsaturated Fat Fat can be up to 20% of total calories Total Fat should be no greater than 25-35% of total calories Carbohydrates should be 50-60% of total calories Protein should be approximately 15% of total calories Fiber should be at least 20-30 grams a day ***Increased fiber may help lower LDL Total Cholesterol should be < 200mg /day Consider adding plant stanol/sterols to diet (example: Benacol spread) ***A higher intake of unsaturated fat may reduce Triglycerides and Increase HDL    Adjunctive Measures (may lower LIPIDS and reduce risk of Heart Attack) include: Aerobic Exercise (20-30 minutes 3-4 times a week) Limit Alcohol Consumption Weight Reduction Aspirin 75-81 mg a day by mouth (if not allergic or contraindicated) Dietary Fiber 20-30 grams a day by mouth    Current Medications: 1)    Bayer Childrens Aspirin 81 Mg Chew (Aspirin) .... Take 1 tablet by  mouth once a day 2)    Citalopram Hydrobromide 40 Mg Tabs (Citalopram hydrobromide) .Marland Kitchen.. 1 tablet by mouth daily - this is a new dose 3)    Fenofibrate 54 Mg Tabs (Fenofibrate) .... 2 tablets by mouth with dinner daily - this is an increased dose 4)    Metformin Hcl 1000 Mg Tabs (Metformin hcl) .... Take 1 tablet by mouth twice a day 5)    Tramadol Hcl 50 Mg Tabs (Tramadol hcl) .... 1/2 tablet by mouth at bedtime as needed for hip pain 6)    Vitamin D3 1000 Unit Caps (Cholecalciferol) .Marland Kitchen.. 1 tablet by mouth daily 7)    One Touch Test Strips For Glucometer  .... Use as directed two to three times a day - dispense 1 box  If you have any questions, please call. We appreciate being able to work with you.   Sincerely,    [facility] Suzanna Obey MD  Appended Document: Lipid Letter mailed

## 2010-09-23 NOTE — Consult Note (Signed)
Summary: [facility]: no retinopathy, left cataract  [facility]   Imported By: Audie Clear 08/12/2010 16:31:06  _____________________________________________________________________  External Attachment:    Type:   Image     Comment:   External Document  Appended Document: [facility]: no retinopathy, left cataract     Clinical Lists Changes  Observations: Added new observation of PAST MED HX: - CKD stage 3 - iPTH normal, Phos, Mag normal, Vit D low 6/09 - Protein electrophoresis negative - 11/21/2003 - Depression major recurrent - HTN - Hyperlipidemia ( LDL 174 on 07/08!! , up to 220 in the past 2 to [place])  - DM - Dermatographism - hives (followed by [facility] 02/2003) - rhinitis allergic - Skin bx:  superficial and deep perivascular and interstitial dermatitis with flame fetures - 11/21/2003 ESR 35, ANA +, ANA titer 1:1280 , Anti-dsDNA neg, Smith ab neg, Sjogrens Ab neg - 11/21/2003 - Cardiolyte: low risk, EF 74% - 07/28/2005 - Colonoscopy: 3 sm sessile polyps snared - 3/1/200 -L side diverticulosis, int hem - 10/21/2002 - h/o plantar fasciitis, - hx of  nipple discharge sent to [facility] - hx of subareolar mass - Hx of Tobacco abuse (Quit smoking on 09/02)  - left cataract (08/16/2010 11:05) Added new observation of PRIMARY MD: Suzanna Obey MD (08/16/2010 11:05)       Prevention & Chronic Care Immunizations   Influenza vaccine: Office supply out  (07/01/2008)   Influenza vaccine due: 07/01/2009    Tetanus booster: 02/20/2003: Done.   Tetanus booster due: 02/19/2013    Pneumococcal vaccine: Done.  (01/20/1997)   Pneumococcal vaccine due: None    H. zoster vaccine: 07/01/2008: Prescription Given  Colorectal Screening   Hemoccult: Done.  (08/22/2002)   Hemoccult due: Not Indicated    Colonoscopy: normal  (01/30/2008)   Colonoscopy due: 01/29/2018  Other Screening   Pap smear: Done.  (02/20/1996)   Pap smear action/deferral:  hysterectomy  (07/27/2007)   Pap smear due: Not Indicated    Mammogram: ASSESSMENT: Negative - BI-RADS 1^MM DIGITAL SCREENING  (07/16/2010)   Mammogram due: 10/28/2008    DXA bone density scan: normal  (02/13/2008)   DXA scan due: None    Smoking status: quit > 6 months  (05/18/2010)  Diabetes Mellitus   HgbA1C: 6.4  (05/18/2010)   Hemoglobin A1C due: 01/23/2009    Eye exam: normal  (12/21/2007)   Eye exam due: 12/20/2008    Foot exam: yes  (07/01/2008)   High risk foot: Not documented   Foot care education: completed  (07/01/2008)   Foot exam due: 07/01/2009    Urine microalbumin/creatinine ratio: Not documented   Urine microalbumin/cr due: 02/10/2009  Lipids   Total Cholesterol: 289  (05/18/2010)   LDL: 171  (05/18/2010)   LDL Direct: 137.6  (11/10/2008)   HDL: 68  (05/18/2010)   Triglycerides: 248  (05/18/2010)    SGOT (AST): 12  (05/18/2010)   SGPT (ALT): 9  (05/18/2010)   Alkaline phosphatase: 54  (05/18/2010)   Total bilirubin: 0.2  (05/18/2010)  Hypertension   Last Blood Pressure: 139 / 74  (05/18/2010)   Serum creatinine: 1.12  (05/18/2010)   Serum potassium 4.6  (05/18/2010)  Self-Management Support :   Personal Goals (by the next clinic visit) :     Personal A1C goal: 7  (05/18/2010)     Personal blood pressure goal: 130/80  (05/18/2010)   Diabetes self-management support: Not documented    Hypertension self-management support: Education handout  (05/18/2010)  Lipid self-management support: Education handout  (05/18/2010)      Past History:  Past Medical History: - CKD stage 3 - iPTH normal, Phos, Mag normal, Vit D low 6/09 - Protein electrophoresis negative - 11/21/2003 - Depression major recurrent - HTN - Hyperlipidemia ( LDL 174 on 07/08!! , up to 220 in the past 2 to [place])  - DM - Dermatographism - hives (followed by [facility] 02/2003) - rhinitis allergic - Skin bx:  superficial and deep perivascular and interstitial  dermatitis with flame fetures - 11/21/2003 ESR 35, ANA +, ANA titer 1:1280 , Anti-dsDNA neg, Smith ab neg, Sjogrens Ab neg - 11/21/2003 - Cardiolyte: low risk, EF 74% - 07/28/2005 - Colonoscopy: 3 sm sessile polyps snared - 3/1/200 -L side diverticulosis, int hem - 10/21/2002 - h/o plantar fasciitis, - hx of  nipple discharge sent to [facility] - hx of subareolar mass - Hx of Tobacco abuse (Quit smoking on 09/02)  - left cataract

## 2010-11-29 LAB — URINE CULTURE
Colony Count: NO GROWTH
Culture: NO GROWTH

## 2010-11-29 LAB — GLUCOSE, CAPILLARY
Glucose-Capillary: 109 mg/dL — ABNORMAL HIGH (ref 70–99)
Glucose-Capillary: 117 mg/dL — ABNORMAL HIGH (ref 70–99)
Glucose-Capillary: 122 mg/dL — ABNORMAL HIGH (ref 70–99)
Glucose-Capillary: 122 mg/dL — ABNORMAL HIGH (ref 70–99)
Glucose-Capillary: 124 mg/dL — ABNORMAL HIGH (ref 70–99)
Glucose-Capillary: 131 mg/dL — ABNORMAL HIGH (ref 70–99)
Glucose-Capillary: 132 mg/dL — ABNORMAL HIGH (ref 70–99)
Glucose-Capillary: 133 mg/dL — ABNORMAL HIGH (ref 70–99)
Glucose-Capillary: 142 mg/dL — ABNORMAL HIGH (ref 70–99)
Glucose-Capillary: 144 mg/dL — ABNORMAL HIGH (ref 70–99)
Glucose-Capillary: 99 mg/dL (ref 70–99)

## 2010-11-29 LAB — CBC
HCT: 23.8 % — ABNORMAL LOW (ref 36.0–46.0)
HCT: 26.8 % — ABNORMAL LOW (ref 36.0–46.0)
Hemoglobin: 8.2 g/dL — ABNORMAL LOW (ref 12.0–15.0)
Hemoglobin: 8.7 g/dL — ABNORMAL LOW (ref 12.0–15.0)
MCHC: 33.7 g/dL (ref 30.0–36.0)
MCHC: 33.8 g/dL (ref 30.0–36.0)
MCHC: 34.2 g/dL (ref 30.0–36.0)
MCHC: 34.5 g/dL (ref 30.0–36.0)
MCV: 90.6 fL (ref 78.0–100.0)
Platelets: 206 10*3/uL (ref 150–400)
Platelets: 225 10*3/uL (ref 150–400)
Platelets: 344 10*3/uL (ref 150–400)
RBC: 2.61 MIL/uL — ABNORMAL LOW (ref 3.87–5.11)
RBC: 4.11 MIL/uL (ref 3.87–5.11)
RDW: 13 % (ref 11.5–15.5)
RDW: 13.5 % (ref 11.5–15.5)
RDW: 13.5 % (ref 11.5–15.5)
RDW: 13.6 % (ref 11.5–15.5)

## 2010-11-29 LAB — URINALYSIS, ROUTINE W REFLEX MICROSCOPIC
Hgb urine dipstick: NEGATIVE
Ketones, ur: 15 mg/dL — AB
Ketones, ur: NEGATIVE mg/dL
Protein, ur: NEGATIVE mg/dL
Protein, ur: NEGATIVE mg/dL
Urobilinogen, UA: 0.2 mg/dL (ref 0.0–1.0)
Urobilinogen, UA: 1 mg/dL (ref 0.0–1.0)

## 2010-11-29 LAB — BASIC METABOLIC PANEL
BUN: 17 mg/dL (ref 6–23)
BUN: 26 mg/dL — ABNORMAL HIGH (ref 6–23)
CO2: 26 mEq/L (ref 19–32)
CO2: 28 mEq/L (ref 19–32)
Chloride: 106 mEq/L (ref 96–112)
Creatinine, Ser: 1.45 mg/dL — ABNORMAL HIGH (ref 0.4–1.2)
GFR calc non Af Amer: 37 mL/min — ABNORMAL LOW (ref >60)
Glucose, Bld: 155 mg/dL — ABNORMAL HIGH (ref 70–99)
Glucose, Bld: 92 mg/dL (ref 70–99)
Potassium: 4.4 mEq/L (ref 3.5–5.1)

## 2010-11-29 LAB — PROTIME-INR
INR: 0.9 (ref 0.00–1.49)
INR: 1.7 — ABNORMAL HIGH (ref 0.00–1.49)
INR: 2.1 — ABNORMAL HIGH (ref 0.00–1.49)
Prothrombin Time: 12.7 seconds (ref 11.6–15.2)
Prothrombin Time: 21 seconds — ABNORMAL HIGH (ref 11.6–15.2)

## 2010-11-29 LAB — ABO/RH: ABO/RH(D): B POS

## 2010-11-29 LAB — DIFFERENTIAL
Basophils Absolute: 0.1 10*3/uL (ref 0.0–0.1)
Basophils Relative: 1 % (ref 0–1)
Eosinophils Absolute: 0.1 10*3/uL (ref 0.0–0.7)
Lymphs Abs: 3.6 10*3/uL (ref 0.7–4.0)
Neutrophils Relative %: 59 % (ref 43–77)

## 2010-11-29 LAB — TYPE AND SCREEN
ABO/RH(D): B POS
Antibody Screen: NEGATIVE

## 2011-01-04 NOTE — Op Note (Signed)
NAMETRECA, TALLMADGE NO.:  [id number]   MEDICAL RECORD NO.:  [id number]          PATIENT TYPE:  INP   LOCATION:  5031                         FACILITY:  [facility]   PHYSICIAN:  Kathalene Frames. Mayer Camel, M.D.   DATE OF BIRTH:  May 21, 1932   DATE OF PROCEDURE:  01/23/2009  DATE OF DISCHARGE:                               OPERATIVE REPORT   PREOPERATIVE DIAGNOSIS:  End-stage arthritis right hip.   POSTOPERATIVE DIAGNOSIS:  End-stage arthritis right hip.   PROCEDURE:  Right total hip arthroplasty using a 48-mm DePuy ASR cup NK  +0 43-mm ultimate head, 16 x 11 x 36 +6 offset stem, 16 D small cone.   SURGEON:  Kathalene Frames.  Mayer Camel, MD   FIRST ASSISTANT:  Leafy Kindle, PA-C   ANESTHETIC:  General endotracheal.   ESTIMATED BLOOD LOSS:  300 mL.   FLUID REPLACEMENT:  1500 mL of crystalloid.   DRAINS PLACED:  Foley catheter.   URINE OUTPUT:  300 mL.   INDICATIONS FOR PROCEDURE:  A 77 year old woman with end-stage arthritis  of the right hip, bone-on-bone arthritic changes by x-ray with  subchondral cystic changes to the acetabulum.  She has failed  conservative treatment, anti-inflammatory medicines, attempts at weight  loss, use of a cane and judicious use of narcotic.  She desires elective  right total hip arthroplasty to decrease pain and increase function.  Risks and benefits of surgery were discussed and questions answered.   DESCRIPTION OF PROCEDURE:  The patient was identified by armband and  taken to the holding area at [facility].  She received  preoperative IV antibiotics and then was taken to the operating room for  the appropriate anesthetic monitors were attached and general  endotracheal anesthesia induced with the patient in supine position.  Foley catheter was inserted.  She was rolled into a left lateral  decubitus position fixed there with a Stulberg Mark II pelvic clamp and  right lower extremity prepped and draped in usual sterile fashion from  the ankle to the hemipelvis.  The skin along the lateral hip and thigh  was infiltrated with 10 mL of 0.50% Marcaine and epinephrine solution  after performing a standard time-out procedure.  We then made a  posterolateral approach to the hip joint centered over the greater  trochanter incision about 15 cm in length through skin and subcutaneous  tissue down to the level of the IT band which was cut along with the  skin incision exposing the greater trochanter.  Cobra retractors were  placed between the gluteus minimus and the superior hip joint capsule  and quadratus femoris and inferior hip joint capsule isolating the  piriformis and short external rotators which were then cut off their  insertion on the intertrochanteric crest.  This exposed posterior aspect  of the hip joint capsule which was likewise developed into an acetabular  based flap going from posterior-superior off the acetabulum out over the  neck of the femur and then out posterior and inferior to the edge of the  acetabulum.  This flap was tagged to #2 Ethibond sutures as  well.  50%  of the rectus femoris was then released.  Hip was flexed and internally  rotated dislocating the femoral head and a standard neck cut performed  one fingerbreadth above the lesser trochanter.  The proximal femur was  then translated anteriorly levering off the anterior column with a  Hohmann retractor, a Cobra retractor was placed in the cotyloid notch  and a posterior inferior wing retractor placed at the junction of the  ischium and the acetabulum.  This allowed excision of the labrum with  the electrocautery and we then sequentially reamed the acetabulum up to  47-mm basket reamer and the edge with a 48-mm basket reamer obtaining  good coverage in all quadrants.  The acetabulum was then irrigated out  with normal saline solution and we hammered into place a 48-mm ASR cup  and 45 degrees of abduction and about 20 degrees of anteversion.   Excellent fit and fill was accomplished and the cup was firmly fixed.  It was then flexed internally, rotated exposing the proximal femur and  we sequentially reamed the proximal femur with axial reamers starting  out with an 8 and reaming up to a 11 where we obtained good chatter,  went to 11:05 and then part way down with the 12.  We then conically  reamed to a 16 D cone and milled the calcar to a 16 D small calcar  obtaining good fill of the cone.  At this point, the femur was irrigated  out with normal saline solution.  A trial 16 D small cone was hammered  into place.  We trimmed the neck of the femur to accommodate the edges  of the cone.  An 11 trial stem was then inserted with a 36 neck, +6  offset and an NK +0 trial head in the same version as the calcar which  was about 15 degrees of anteversion.  Hip was then reduced and taken  through range of motion.  It could flex to 90 with 60 of internal  rotation.  The hip could not be dislocated in extension and external  rotation and shuck test was negative.  The knee could be bent to 120  degrees with the hip in full extension.  At this point, the trial  components were removed and the wound irrigated out one more time with  normal saline solution.  We then selected a 16 D small ZTT1 cone which  was hammered into place followed by 16 x 11 x 36 with 6 offset stem, S-  ROM neck and this was hammered into place in the same version as the  calcar.  An NK +0 43-mm ultimate head was then hammered onto the stem  after drying the trunnion and the hip again reduced, stability checked  and found to be excellent.  The wound was then irrigated out with normal  saline solution.  The capsular flap and short external rotators were  repaired back to the intertrochanteric crest through drill holes.  The  IT band closed with running #1 Vicryl suture, the subcutaneous tissue  with 0 and 2-0 undyed Vicryl suture and the skin with running  interlocking 3-0  nylon suture.  A dressing of Xeroform and Mepilex was  then applied.  The patient was unclamped, rolled supine, awakened and  taken to recovery room without difficulty.      Kathalene Frames. Mayer Camel, M.D.  Electronically Signed     FJR/MEDQ  D:  01/23/2009  T:  01/24/2009  Job:  [id number] 

## 2011-01-07 NOTE — Op Note (Signed)
NAME:  Jill [name], Jill [name]                          ACCOUNT NO.:  [id number]   MEDICAL RECORD NO.:  [id number]                   PATIENT TYPE:  AMB   LOCATION:  ENDO                                 FACILITY:  [facility]   PHYSICIAN:  Nelwyn Salisbury, M.D.               DATE OF BIRTH:  October 05, 1931   DATE OF PROCEDURE:  10/23/2002  DATE OF DISCHARGE:                                 OPERATIVE REPORT   PROCEDURE:  Colonoscopy with snare polypectomy x3.   ENDOSCOPIST:  Nelwyn Salisbury, M.D.   INSTRUMENT USED:  Olympus video colonoscope changed to a pediatric  adjustable colonoscope.   INDICATIONS FOR PROCEDURE:  A 77 year old white female with a history of a  polyp at 10 cm seen on flexible sigmoidoscopy, rule out other colonic  polyps, polypectomy planned.   PREPROCEDURE PREPARATION:  Informed consent was procured from the patient.  The patient was fasted for eight hours prior to the procedure and prepped  with a bottle of MiraLax and Gatorade the night prior to the procedure.   PREPROCEDURE PHYSICAL:  VITAL SIGNS: Stable.  NECK:  Supple.  CHEST:  Clear to auscultation.  S1 and S2 regular.  ABDOMEN:  Soft with normal bowel sounds.   DESCRIPTION OF PROCEDURE:  The patient was placed in the left lateral  decubitus position and sedated with 100 mg of Demerol and 10 mg of Versed  intravenously.  Once the patient was adequately sedated and maintained on  low flow oxygen and continuous cardiac monitoring, the Olympus video  colonoscope was advanced from the rectum to about 20 cm.  Three sessile  polyps were removed from the rectum with snare polypectomy.  Small internal  hemorrhoids were seen on retroflexion in the rectum. The scope could not be  advanced beyond 20 cm and therefore, this was changed to an adjustable  pediatric scope which was advanced up to the cecum without difficulty.  The  patient had extensive left-sided diverticulosis of the transverse colon.  Right colon and cecum  appeared normal.   IMPRESSION:  1. Three small sessile polyps snared from the rectum.  2. Small nonbleeding internal hemorrhoids.  3. Extensive left-sided diverticulosis.  4. Normal appearing transverse colon, right colon, and cecum.  5. Some residual stool in the colon, very small lesions could have been     missed.    RECOMMENDATIONS:  Await pathology.  Avoid all nonsteroidals including  aspirin.  High fiber diet with liberal fluid intake.  Outpatient follow-up  in the next two weeks for further recommendations.                                               Nelwyn Salisbury, M.D.    JNM/MEDQ  D:  10/23/2002  T:  10/23/2002  Job:  [id number]   cc:   William A. Hensel, M.D.  [address] [place]  [place] [address]  Fax: [phone]

## 2011-01-07 NOTE — Discharge Summary (Signed)
Jill [name], Jill [name] NO.:  [id number]   MEDICAL RECORD NO.:  [id number]          [name] TYPE:  INP   LOCATION:  5031                         FACILITY:  [facility]   PHYSICIAN:  Kathalene Frames. Mayer Camel, M.D.   DATE OF BIRTH:  1931-11-24   DATE OF ADMISSION:  01/23/2009  DATE OF DISCHARGE:  01/26/2009                               DISCHARGE SUMMARY   CHIEF COMPLAINT:  Right hip pain.   HISTORY OF PRESENT ILLNESS:  This is a 77 year old lady who complains of  severe unremitting pain in her right hip despite conservative treatment  with narcotic, activity modifications, and weight loss.  She desires a  surgical intervention at this time.  All risks and benefits of surgery  were discussed with Jill [name].   PAST MEDICAL HISTORY:  Significant for hypertension, hyperlipidemia,  type 2 diabetes, and renal insufficiency.   PAST SURGICAL HISTORY:  Significant for hysterectomy.   ALLERGIES:  She as an allergy to CODEINE.   CURRENT MEDICATIONS:  1. Aspirin 81 mg 1 p.o. daily.  2. Citalopram 40 mg 1 p.o. daily.  3. Metformin 1000 mg 1 p.o. b.i.d.  4. Tramadol 50 mg 1 p.o. nightly.  5. Fenofibrate 15 mg 1 p.o. b.i.d.  6. Tylenol 650 mg 1 p.o. q.i.d.  7. Calcium 600 mg 1 p.o. daily.   SOCIAL HISTORY:  She quit smoking in 2002 and denies Jill use of alcohol.   FAMILY HISTORY:  Positive for diabetes, dementia, and pancreatic cancer.   PHYSICAL EXAMINATION:  Gross examination of Jill right hip demonstrates  Jill [name] has tenderness with internal rotation.  She is  neurovascularly intact.   X-rays demonstrate bone-on-bone degenerative joint disease of Jill right  hip.   PREOPERATIVE LABORATORY DATA:  White blood cells 10.6, red blood cells  4.11, hemoglobin 12.6, hematocrit 37.2, and platelet 344.  PT 12.7, INR  0.9, and PTT 26.  Sodium 140, potassium 5.3, chloride 106, glucose 92,  BUN 26, and creatinine 1.45.  Urinalysis was within normal limits.   HOSPITAL COURSE:  Jill [name] was admitted to [facility] on January 23, 2009, when she underwent right total hip arthroplasty.  Jill procedure  was performed by Dr. Frederik Pear and Jill [name] tolerated it well.  Perioperatively, a Foley catheter was placed.  Jill [name] was  transferred from Recovery to [facility] Orthopedic Floor and placed on Lovenox  and Coumadin for DVT prophylaxis.  On Jill first postoperative day, she  was awake and alert and tolerating p.o. intake well.  Her pain was well  controlled with her PCA.  Hemoglobin was 9.1, and her dressing was clean  and dry.  On Jill second postoperative day, Jill [name] reported minimal  pain in her hip at rest.  She was progressing well with physical  therapy.  Hemoglobin remained at 9.1, and her surgical dressing was  clean.  Her incision was benign.  On Jill third postoperative day, Jill  [name] was awake and alert.  She had passed all of her physical therapy  goals.  Hemoglobin was 8.2, but she denied any dizziness,  fatigue, or  shortness of breath and she was discharged home.   DISPOSITION:  Jill [name] was discharged home on January 26, 2009.  [facility] would manage her wound, Coumadin, and physical therapy.  She  would return to Jill clinic to see Dr. Mayer Camel in 7-10 days for x-rays and  suture removal.  She was weightbearing as tolerated.  Discharge  medicines were as per Jill HMR with Jill addition of Percocet 5 mg 1-2  tabs p.o. q.4 h. p.r.n. pain and Coumadin to take as directed with a  target INR of 1.522.   FINAL DIAGNOSIS:  End-stage degenerative joint disease of Jill right hip  with a secondary diagnosis of acute blood loss anemia.      Leafy Kindle, PA      Kathalene Frames. Mayer Camel, M.D.  Electronically Signed    JW/MEDQ  D:  03/04/2009  T:  03/05/2009  Job:  [id number]

## 2011-01-07 NOTE — Op Note (Signed)
NAME:  Jill [name], Jill [name]                          ACCOUNT NO.:  [id number]   MEDICAL RECORD NO.:  [id number]                   PATIENT TYPE:  AMB   LOCATION:  [place]                                  FACILITY:  [facility]   PHYSICIAN:  Merri Ray. Grandville Silos, M.D.             DATE OF BIRTH:  September 11, 1931   DATE OF PROCEDURE:  03/15/2004  DATE OF DISCHARGE:                                 OPERATIVE REPORT   PREOPERATIVE DIAGNOSES:  1. Left sub-areolar breast mass.  2. Left bloody nipple discharge.   POSTOPERATIVE DIAGNOSES:  1. Left sub-areolar breast mass.  2. Left bloody nipple discharge.   PROCEDURE:  Left breast biopsy.   SURGEON:  Merri Ray. Grandville Silos, M.D.   ANESTHESIA:  Local plus sedation.   INDICATIONS FOR PROCEDURE:  The patient is a 77 year old white female whom I  evaluated in the office for a left nipple discharge which was clear,  changing to bloody in character.  She had a vague mass in her sub-areolar  area on examination, and she now presents for a left breast biopsy.   DESCRIPTION OF PROCEDURE:  An informed consent was obtained.  The patient  received intravenous antibiotics. She was brought to the operating room.  Conscious sedation was administered.  Her left breast was prepped and draped  in a sterile fashion.  Lidocaine 1% plain mixed with 0.25% Marcaine with  epinephrine was injected for some local anesthetic.  A superior  circumareolar incision was made.  The subcutaneous tissues were dissected  down.  The nipple, skin and complex were dissected from the underlying  tissues with the Bovie.  There was a big mass palpable beneath the nipple.  This area was grasped with an Allis clamp and circumferentially dissected.  It appeared to be somewhat cystic in nature.  The mass was approximately 2.0  cm x 3.0 cm.  This was circumferentially dissected and excised in one piece  with the Bovie cautery.  Excellent hemostasis was obtained with the cautery  device.  The mass was sent to  pathology.  The wound was copiously irrigated.  Further hemostasis was obtained.  Once this was accomplished, the area was  again irrigated.  A small amount of additional local anesthetic was  injected.  The wound remained hemostatic.  The subcutaneous tissues were  then approximated with interrupted #3-0 Vicryl sutures.  The area was again  irrigated, and the skin was closed with a running #4-0 Monocryl subcuticular  stitch.  Benzoin, Steri-Strips and a sterile dressing were applied.  The  sponge, needle and instrument counts were correct.  The patient tolerated the procedure well without apparent complications.  She was taken to the recovery room in stable condition.  Merri Ray Grandville Silos, M.D.    BET/MEDQ  D:  03/15/2004  T:  03/15/2004  Job:  [id number]

## 2011-06-16 ENCOUNTER — Ambulatory Visit (INDEPENDENT_AMBULATORY_CARE_PROVIDER_SITE_OTHER): Payer: Medicare Other | Admitting: Family Medicine

## 2011-06-16 ENCOUNTER — Encounter: Payer: Self-pay | Admitting: Family Medicine

## 2011-06-16 VITALS — BP 131/78 | HR 88 | Temp 97.9°F | Ht 64.0 in | Wt 168.4 lb

## 2011-06-16 DIAGNOSIS — E785 Hyperlipidemia, unspecified: Secondary | ICD-10-CM

## 2011-06-16 DIAGNOSIS — F339 Major depressive disorder, recurrent, unspecified: Secondary | ICD-10-CM

## 2011-06-16 DIAGNOSIS — E119 Type 2 diabetes mellitus without complications: Secondary | ICD-10-CM

## 2011-06-16 LAB — POCT UA - MICROALBUMIN
Creatinine, POC: 50 mg/dL
Microalbumin Ur, POC: 30 mg/dL

## 2011-06-16 LAB — COMPREHENSIVE METABOLIC PANEL
Alkaline Phosphatase: 43 U/L (ref 39–117)
BUN: 21 mg/dL (ref 6–23)
CO2: 24 mEq/L (ref 19–32)
Creat: 1.47 mg/dL — ABNORMAL HIGH (ref 0.50–1.10)
Glucose, Bld: 91 mg/dL (ref 70–99)
Total Bilirubin: 0.3 mg/dL (ref 0.3–1.2)

## 2011-06-16 LAB — LIPID PANEL
Cholesterol: 231 mg/dL — ABNORMAL HIGH (ref 0–200)
Triglycerides: 141 mg/dL (ref <150)
VLDL: 28 mg/dL (ref 0–40)

## 2011-06-16 LAB — POCT GLYCOSYLATED HEMOGLOBIN (HGB A1C): Hemoglobin A1C: 6.6

## 2011-06-16 LAB — CBC
Hemoglobin: 12.3 g/dL (ref 12.0–15.0)
MCH: 28.5 pg (ref 26.0–34.0)
MCHC: 31.6 g/dL (ref 30.0–36.0)
MCV: 90.3 fL (ref 78.0–100.0)
RBC: 4.31 MIL/uL (ref 3.87–5.11)

## 2011-06-16 NOTE — Patient Instructions (Signed)
Nice to meet you! I will call you with results. Make an appointment for check up in 4 months. I will refill your medications today.  Diabetes and Exercise Regular exercise is important and can help:   Control blood glucose (sugar).   Decrease blood pressure.    Control blood lipids (cholesterol, triglycerides).   Improve overall health.  BENEFITS FROM EXERCISE  Improved fitness.   Improved flexibility.   Improved endurance.   Increased bone density.   Weight control.   Increased muscle strength.   Decreased body fat.   Improvement of the body's use of insulin, a hormone.   Increased insulin sensitivity.   Reduction of insulin needs.   Reduced stress and tension.   Helps you feel better.  People with diabetes who add exercise to their lifestyle gain additional benefits, including:  Weight loss.   Reduced appetite.   Improvement of the body's use of blood glucose.   Decreased risk factors for heart disease:   Lowering of cholesterol and triglycerides.   Raising the level of good cholesterol (high-density lipoproteins, HDL).   Lowering blood sugar.   Decreased blood pressure.  TYPE 1 DIABETES AND EXERCISE  Exercise will usually lower your blood glucose.   If blood glucose is greater than 240 mg/dl, check urine ketones. If ketones are present, do not exercise.   Location of the insulin injection sites may need to be adjusted with exercise. Avoid injecting insulin into areas of the body that will be exercised. For example, avoid injecting insulin into:   The arms when playing tennis.   The legs when jogging. For more information, discuss this with your caregiver.   Keep a record of:   Food intake.   Type and amount of exercise.   Expected peak times of insulin action.   Blood glucose levels.  Do this before, during, and after exercise. Review your records with your caregiver. This will help you to develop guidelines for adjusting food intake  and insulin amounts.  TYPE 2 DIABETES AND EXERCISE  Regular physical activity can help control blood glucose.   Exercise is important because it may:   Increase the body's sensitivity to insulin.   Improve blood glucose control.   Exercise reduces the risk of heart disease. It decreases serum cholesterol and triglycerides. It also lowers blood pressure.   Those who take insulin or oral hypoglycemic agents should watch for signs of hypoglycemia. These signs include dizziness, shaking, sweating, chills, and confusion.   Body water is lost during exercise. It must be replaced. This will help to avoid loss of body fluids (dehydration) or heat stroke.  Be sure to talk to your caregiver before starting an exercise program to make sure it is safe for you. Remember, any activity is better than none.  Document Released: 10/29/2003 Document Revised: 04/20/2011 Document Reviewed: 02/12/2009 [facility] Patient Information 2012 [place].

## 2011-06-17 ENCOUNTER — Encounter: Payer: Self-pay | Admitting: Family Medicine

## 2011-06-17 MED ORDER — LISINOPRIL 5 MG PO TABS: 2.5000 mg | ORAL_TABLET | Freq: Every day | ORAL | Status: DC

## 2011-06-17 MED ORDER — CITALOPRAM HYDROBROMIDE 40 MG PO TABS: 40.0000 mg | ORAL_TABLET | Freq: Every day | ORAL | Status: DC

## 2011-06-17 MED ORDER — METFORMIN HCL 1000 MG PO TABS: 1000.0000 mg | ORAL_TABLET | Freq: Two times a day (BID) | ORAL | Status: DC

## 2011-06-17 MED ORDER — FENOFIBRATE 54 MG PO TABS: 54.0000 mg | ORAL_TABLET | Freq: Every day | ORAL | Status: DC

## 2011-06-17 NOTE — Progress Notes (Signed)
  Subjective:    Patient ID: Jill [name], female    DOB: May 27, 1932, 77 y.o.   MRN: [id number]  HPI 1. DM. Feels like has been controlled relatively well. Only 2 values >200 in the past 2 weeks, but recently finished treatment for a UTI. Taking metformin as directed and no side effects. Denies symptoms of hyperglycemia, skin wounds, visual changes, neuropathy symptoms.  2. UTI. Finished 10 days treatment with cipro and now symptoms resolved. No longer having frequency and dysuria. Has some leg cramping and knee pain that she attributes to antibiotic, but now has improved.  3. HLD. Failed statin therapy due to side effects. Taking fibrate currently. Not fasting today for labs.  Review of Systems See HPI otherwise negative. Former smoker: Patient has 20 pack years smoking hx.     Objective:   Physical Exam  Vitals reviewed. Constitutional: She is oriented to person, place, and time. She appears well-developed and well-nourished. No distress.  HENT:  Head: Normocephalic and atraumatic.  Eyes: EOM are normal. Pupils are equal, round, and reactive to light.  Cardiovascular: Normal rate, regular rhythm, normal heart sounds and intact distal pulses.   No murmur heard. Pulmonary/Chest: Effort normal and breath sounds normal. No respiratory distress. She has no wheezes. She has no rales.  Musculoskeletal: She exhibits no edema and no tenderness.  Neurological: She is alert and oriented to person, place, and time. No cranial nerve deficit.       Gait mildly antalgic. Walks unassisted.   Skin: She is not diaphoretic.  Psychiatric: She has a normal mood and affect.          Assessment & Plan:

## 2011-06-17 NOTE — Assessment & Plan Note (Signed)
Failed statin therapy. Last LDL at goal, on fibrate therapy currently. Not fasting today, will continue current management.

## 2011-06-17 NOTE — Assessment & Plan Note (Signed)
Stable on celexa. Will continue to prevent relapse.

## 2011-06-17 NOTE — Assessment & Plan Note (Addendum)
Will check cr has ranged from 1.1-1.4 in recent past. Discuseds merit vs risk of ACEi pending results. BP and A1c at goal currently, but pt has family history of longevity and may benefit from ACEi renal protection in the long term. Would start low dose Lisinopril and f/u lab in one week.

## 2011-06-17 NOTE — Assessment & Plan Note (Signed)
A1c at goal on metformin. Will f/u labs and creatinine today. May benefit from low dose ACEi but blood pressure at goal and may also cause unacceptable side effects. Will check micro-albumin and discuss results and risks/benefits with patient.

## 2011-06-23 ENCOUNTER — Other Ambulatory Visit: Payer: Medicare Other

## 2011-06-23 DIAGNOSIS — N183 Chronic kidney disease, stage 3 unspecified: Secondary | ICD-10-CM

## 2011-06-23 LAB — BASIC METABOLIC PANEL
BUN: 27 mg/dL — ABNORMAL HIGH (ref 6–23)
Chloride: 104 mEq/L (ref 96–112)
Potassium: 4.3 mEq/L (ref 3.5–5.3)
Sodium: 140 mEq/L (ref 135–145)

## 2011-06-23 NOTE — Progress Notes (Signed)
BMP DONE TODAY Amazing Cowman 

## 2011-07-31 ENCOUNTER — Ambulatory Visit (INDEPENDENT_AMBULATORY_CARE_PROVIDER_SITE_OTHER): Payer: Medicare Other

## 2011-07-31 DIAGNOSIS — R300 Dysuria: Secondary | ICD-10-CM

## 2012-01-13 ENCOUNTER — Encounter: Payer: Self-pay | Admitting: Family Medicine

## 2012-01-13 ENCOUNTER — Ambulatory Visit (INDEPENDENT_AMBULATORY_CARE_PROVIDER_SITE_OTHER): Payer: Medicare Other | Admitting: Family Medicine

## 2012-01-13 VITALS — BP 138/81 | HR 89 | Temp 97.8°F | Ht 62.5 in | Wt 159.8 lb

## 2012-01-13 DIAGNOSIS — E785 Hyperlipidemia, unspecified: Secondary | ICD-10-CM

## 2012-01-13 DIAGNOSIS — F329 Major depressive disorder, single episode, unspecified: Secondary | ICD-10-CM

## 2012-01-13 DIAGNOSIS — E119 Type 2 diabetes mellitus without complications: Secondary | ICD-10-CM

## 2012-01-13 DIAGNOSIS — I10 Essential (primary) hypertension: Secondary | ICD-10-CM

## 2012-01-13 DIAGNOSIS — L508 Other urticaria: Secondary | ICD-10-CM

## 2012-01-13 DIAGNOSIS — F339 Major depressive disorder, recurrent, unspecified: Secondary | ICD-10-CM

## 2012-01-13 LAB — POCT GLYCOSYLATED HEMOGLOBIN (HGB A1C): Hemoglobin A1C: 6.4

## 2012-01-13 LAB — BASIC METABOLIC PANEL
CO2: 27 mEq/L (ref 19–32)
Calcium: 9.4 mg/dL (ref 8.4–10.5)
Chloride: 106 mEq/L (ref 96–112)
Potassium: 4.3 mEq/L (ref 3.5–5.3)
Sodium: 141 mEq/L (ref 135–145)

## 2012-01-13 LAB — CBC
HCT: 36.4 % (ref 36.0–46.0)
Hemoglobin: 12.1 g/dL (ref 12.0–15.0)
MCHC: 33.2 g/dL (ref 30.0–36.0)
MCV: 83.9 fL (ref 78.0–100.0)

## 2012-01-13 MED ORDER — METFORMIN HCL 1000 MG PO TABS: 1000.0000 mg | ORAL_TABLET | Freq: Two times a day (BID) | ORAL | Status: DC

## 2012-01-13 MED ORDER — FENOFIBRATE 54 MG PO TABS: 54.0000 mg | ORAL_TABLET | Freq: Every day | ORAL | Status: DC

## 2012-01-13 MED ORDER — CITALOPRAM HYDROBROMIDE 40 MG PO TABS: 40.0000 mg | ORAL_TABLET | Freq: Every day | ORAL | Status: DC

## 2012-01-13 MED ORDER — LISINOPRIL 5 MG PO TABS: 2.5000 mg | ORAL_TABLET | Freq: Every day | ORAL | Status: DC

## 2012-01-13 NOTE — Patient Instructions (Signed)
Nice to see you again. Your blood pressure and diabetes are great! Keep taking your medications as prescribed. I will call if any tests are abnormal.  Diabetes and Exercise Regular exercise is important and can help:   Control blood glucose (sugar).   Decrease blood pressure.    Control blood lipids (cholesterol, triglycerides).   Improve overall health.  BENEFITS FROM EXERCISE  Improved fitness.   Improved flexibility.   Improved endurance.   Increased bone density.   Weight control.   Increased muscle strength.   Decreased body fat.   Improvement of the body's use of insulin, a hormone.   Increased insulin sensitivity.   Reduction of insulin needs.   Reduced stress and tension.   Helps you feel better.  People with diabetes who add exercise to their lifestyle gain additional benefits, including:  Weight loss.   Reduced appetite.   Improvement of the body's use of blood glucose.   Decreased risk factors for heart disease:   Lowering of cholesterol and triglycerides.   Raising the level of good cholesterol (high-density lipoproteins, HDL).   Lowering blood sugar.   Decreased blood pressure.  TYPE 1 DIABETES AND EXERCISE  Exercise will usually lower your blood glucose.   If blood glucose is greater than 240 mg/dl, check urine ketones. If ketones are present, do not exercise.   Location of the insulin injection sites may need to be adjusted with exercise. Avoid injecting insulin into areas of the body that will be exercised. For example, avoid injecting insulin into:   The arms when playing tennis.   The legs when jogging. For more information, discuss this with your caregiver.   Keep a record of:   Food intake.   Type and amount of exercise.   Expected peak times of insulin action.   Blood glucose levels.  Do this before, during, and after exercise. Review your records with your caregiver. This will help you to develop guidelines for  adjusting food intake and insulin amounts.  TYPE 2 DIABETES AND EXERCISE  Regular physical activity can help control blood glucose.   Exercise is important because it may:   Increase the body's sensitivity to insulin.   Improve blood glucose control.   Exercise reduces the risk of heart disease. It decreases serum cholesterol and triglycerides. It also lowers blood pressure.   Those who take insulin or oral hypoglycemic agents should watch for signs of hypoglycemia. These signs include dizziness, shaking, sweating, chills, and confusion.   Body water is lost during exercise. It must be replaced. This will help to avoid loss of body fluids (dehydration) or heat stroke.  Be sure to talk to your caregiver before starting an exercise program to make sure it is safe for you. Remember, any activity is better than none.  Document Released: 10/29/2003 Document Revised: 07/28/2011 Document Reviewed: 02/12/2009 [facility] Patient Information 2012 [place].

## 2012-01-16 NOTE — Assessment & Plan Note (Signed)
At goal considering age and DM. Continue lisinopril 5 mg. Check labs today to assess renal function.

## 2012-01-16 NOTE — Assessment & Plan Note (Addendum)
Creatinine stable on low dose ACEi.

## 2012-01-16 NOTE — Progress Notes (Signed)
  Subjective:    Patient ID: Jill [name], female    DOB: 1931/10/08, 78 y.o.   MRN: [id number]  HPI  1. DM. Patient endorses good control. Fasting CBG was 117 this am. Has started walking daily with good weather. Works in garden also for physical activity. Denies neuropathy symptoms, polyuria, polydipsia.  2. Chronic hives. Being worked up by [employer] allergy specialist. Not exacerbated currently.  3. Depression. Under some amount of stress recently due to husband's diagnosis of Parkinson's disease. Is his primary caregiver. No medication side effects, has been stable on celexa and would rather not go off this medication.  4. HLD. Takes fibrate due to inability to tolerate statin-caused aches, malaise.   5. CKD. Started low dose ACEi at last visit.  No changes in urinary habits.   6. HTN. Taking meds daily. Denies side effects, presyncope, visual changes, edema, chest pain.  Review of Systems See HPI otherwise negative.  reports that she quit smoking about 12 years ago. She does not have any smokeless tobacco history on file.    Objective:   Physical Exam  Vitals reviewed. Constitutional: She is oriented to person, place, and time. She appears well-developed and well-nourished. No distress.  HENT:  Head: Normocephalic and atraumatic.  Eyes: EOM are normal. Pupils are equal, round, and reactive to light.  Neck: Neck supple.  Cardiovascular: Normal rate, regular rhythm and normal heart sounds.   No murmur heard. Pulmonary/Chest: Effort normal and breath sounds normal. No respiratory distress. She has no wheezes. She has no rales.  Musculoskeletal: She exhibits no edema and no tenderness.  Neurological: She is alert and oriented to person, place, and time. Coordination normal.  Psychiatric: She has a normal mood and affect.       Assessment & Plan:

## 2012-01-16 NOTE — Assessment & Plan Note (Signed)
Stable on celexa, patient not interested in taper or discontinuation.

## 2012-01-16 NOTE — Assessment & Plan Note (Signed)
LDL above goal, but patient is intolerant of statins. Continue fibrate and recheck at next visit.

## 2012-01-16 NOTE — Assessment & Plan Note (Signed)
Good control, continue metformin and exercise.

## 2012-04-16 ENCOUNTER — Other Ambulatory Visit: Payer: Self-pay | Admitting: Family Medicine

## 2012-07-25 ENCOUNTER — Ambulatory Visit (INDEPENDENT_AMBULATORY_CARE_PROVIDER_SITE_OTHER): Payer: Medicare Other | Admitting: Family Medicine

## 2012-07-25 ENCOUNTER — Encounter: Payer: Self-pay | Admitting: Family Medicine

## 2012-07-25 VITALS — BP 132/84 | HR 84 | Temp 98.4°F | Wt 165.1 lb

## 2012-07-25 DIAGNOSIS — E119 Type 2 diabetes mellitus without complications: Secondary | ICD-10-CM

## 2012-07-25 DIAGNOSIS — Z862 Personal history of diseases of the blood and blood-forming organs and certain disorders involving the immune mechanism: Secondary | ICD-10-CM

## 2012-07-25 DIAGNOSIS — F339 Major depressive disorder, recurrent, unspecified: Secondary | ICD-10-CM

## 2012-07-25 DIAGNOSIS — D539 Nutritional anemia, unspecified: Secondary | ICD-10-CM

## 2012-07-25 DIAGNOSIS — R5381 Other malaise: Secondary | ICD-10-CM

## 2012-07-25 DIAGNOSIS — F329 Major depressive disorder, single episode, unspecified: Secondary | ICD-10-CM

## 2012-07-25 DIAGNOSIS — R55 Syncope and collapse: Secondary | ICD-10-CM

## 2012-07-25 DIAGNOSIS — I10 Essential (primary) hypertension: Secondary | ICD-10-CM

## 2012-07-25 DIAGNOSIS — R5383 Other fatigue: Secondary | ICD-10-CM

## 2012-07-25 DIAGNOSIS — E559 Vitamin D deficiency, unspecified: Secondary | ICD-10-CM

## 2012-07-25 DIAGNOSIS — R079 Chest pain, unspecified: Secondary | ICD-10-CM

## 2012-07-25 DIAGNOSIS — D649 Anemia, unspecified: Secondary | ICD-10-CM

## 2012-07-25 DIAGNOSIS — M899 Disorder of bone, unspecified: Secondary | ICD-10-CM

## 2012-07-25 LAB — COMPREHENSIVE METABOLIC PANEL
BUN: 21 mg/dL (ref 6–23)
CO2: 26 mEq/L (ref 19–32)
Calcium: 9.2 mg/dL (ref 8.4–10.5)
Chloride: 105 mEq/L (ref 96–112)
Creat: 1.41 mg/dL — ABNORMAL HIGH (ref 0.50–1.10)

## 2012-07-25 LAB — CBC WITH DIFFERENTIAL/PLATELET
Eosinophils Relative: 2 % (ref 0–5)
HCT: 36.1 % (ref 36.0–46.0)
Lymphocytes Relative: 35 % (ref 12–46)
Lymphs Abs: 2.9 10*3/uL (ref 0.7–4.0)
MCH: 27.8 pg (ref 26.0–34.0)
MCV: 85.1 fL (ref 78.0–100.0)
Monocytes Absolute: 0.6 10*3/uL (ref 0.1–1.0)
RDW: 14.5 % (ref 11.5–15.5)
WBC: 8.3 10*3/uL (ref 4.0–10.5)

## 2012-07-25 LAB — POCT SEDIMENTATION RATE: POCT SED RATE: 20 mm/hr (ref 0–22)

## 2012-07-25 MED ORDER — CITALOPRAM HYDROBROMIDE 40 MG PO TABS: 20.0000 mg | ORAL_TABLET | Freq: Every day | ORAL | Status: DC

## 2012-07-25 MED ORDER — METFORMIN HCL 1000 MG PO TABS: 500.0000 mg | ORAL_TABLET | Freq: Two times a day (BID) | ORAL | Status: DC

## 2012-07-25 NOTE — Assessment & Plan Note (Signed)
Stable currently, prefers to continue celexa. Discussed trial to decrease dose to 20 mg to avoid side effects. If symptoms worsen, suggested she could restart 40 mg. F/u in 2-3 months or sooner if needed.

## 2012-07-25 NOTE — Assessment & Plan Note (Signed)
At goal for age and comorbidities. F/u in 2-3 months.

## 2012-07-25 NOTE — Patient Instructions (Signed)
Nice to see you. Lets check lab tests to make sure no reason for tiredness. Your A1c is too good today. Go down on your metformin to half tablet twice daily. Go down on your citalopram (celexa) at 20 mg daily. If you have any more symptoms of lightheadedness then come to the doctor.

## 2012-07-25 NOTE — Assessment & Plan Note (Signed)
Repeat vitamin D level

## 2012-07-25 NOTE — Progress Notes (Signed)
  Subjective:    Patient ID: Jill [name], female    DOB: 04-18-1932, 78 y.o.   MRN: [id number]  HPI  1. DM. Reports blood sugar range 68-198 recently. She is taking metformin only. Denies polyuria, polydipsia, fatigue, weight changes, foot problems/pain.  2. Fatigue. This is a chronic complaint, patient states maybe related to old age. She reports just chronic fatigue daily, but specifically an instance 3-4 weeks ago at grocery store she felt lightheaded and had to sit down while standing at [employer]. Denies LOC, fall, incontinence, chest pain, palpitations. Somehow the fire dept was called who checked her CBG, BP and reportedly was normal, she was not taken to the hospital. Patient thinks there is nothing wrong, that's why she didn't seek medical care. Denies dyspnea on exertion, chest pain, edema, syncope, blood in stool.   3. Depression. Has been stable on celexa. Once when she tried to go off medication, she was very tearful and prefers to stay on the medication. Denies worsened mood, behavior changes, confusion.  Review of Systems See HPI otherwise negative.  reports that she quit smoking about 13 years ago. She does not have any smokeless tobacco history on file.     Objective:   Physical Exam  Vitals reviewed. Constitutional: She is oriented to person, place, and time. She appears well-developed and well-nourished. No distress.  HENT:  Head: Normocephalic and atraumatic.  Right Ear: External ear normal.  Left Ear: External ear normal.  Nose: Nose normal.  Mouth/Throat: Oropharynx is clear and moist. No oropharyngeal exudate.  Eyes: EOM are normal. Pupils are equal, round, and reactive to light.  Neck: Normal range of motion. Neck supple.  Cardiovascular: Normal rate, regular rhythm and normal heart sounds.   No murmur heard. Pulmonary/Chest: Effort normal and breath sounds normal. No respiratory distress. She has no wheezes. She has no rales.  Abdominal: Soft. She exhibits  no distension. There is no tenderness. There is no rebound.  Musculoskeletal: She exhibits no edema and no tenderness.       Ascends to exam table unassisted, no orthostasis observed. Normal ambulation.  Lymphadenopathy:    She has no cervical adenopathy.  Neurological: She is alert and oriented to person, place, and time. No cranial nerve deficit. She exhibits normal muscle tone. Coordination normal.  Skin: No rash noted. She is not diaphoretic.       Feet wnl, no decreased sensation or lesion.  Psychiatric: She has a normal mood and affect. Her behavior is normal.       Assessment & Plan:

## 2012-07-25 NOTE — Assessment & Plan Note (Signed)
This is a chronic complaint, doesn't seem to bother the patient too much currently. No weakness or focal findings on exam. Will check labs for anemia, ESR, vitamin D, renal function to guide any further investigation.

## 2012-07-25 NOTE — Assessment & Plan Note (Signed)
A1c is well below goal of 7. Discussed decreasing metformin to 500 mg BID to avoid side effects of overtreatment. Foot exam wnl. UTD on flu vaccine. Plans to see her eye doctor this month. Continue lisinopril 2.5 mg daily.

## 2012-07-26 LAB — VITAMIN D 25 HYDROXY (VIT D DEFICIENCY, FRACTURES): Vit D, 25-Hydroxy: 17 ng/mL — ABNORMAL LOW (ref 30–89)

## 2012-07-26 LAB — FERRITIN: Ferritin: 13 ng/mL (ref 10–291)

## 2012-07-26 LAB — TSH: TSH: 2.768 u[IU]/mL (ref 0.350–4.500)

## 2012-07-27 ENCOUNTER — Telehealth: Payer: Self-pay | Admitting: Family Medicine

## 2012-07-27 NOTE — Telephone Encounter (Signed)
Patient informed that meds refilled to her pharmacy.  Nolene Ebbs, RN

## 2012-07-27 NOTE — Telephone Encounter (Signed)
Spoke with [facility] & they did not receive refills that were e-Rx's on 07/25/12.  Informed ok to refill Lisinopril, citalopram, fenofibrate, and metformin with one add'l refill.  Called patient to informed that meds were refilled but no answer at home phone number.   Nolene Ebbs, RN

## 2012-07-27 NOTE — Telephone Encounter (Signed)
The pharmacy did not receive the refills for Lisinopril, Citalopram, Fenofibrate and Metformin and she needs them to be resent.  She doesn't have any Metformin or Fenofibrate at this time so she would really appreciate that being done today.

## 2012-07-31 ENCOUNTER — Telehealth: Payer: Self-pay | Admitting: Family Medicine

## 2012-07-31 ENCOUNTER — Other Ambulatory Visit: Payer: Self-pay | Admitting: Family Medicine

## 2012-07-31 MED ORDER — ERGOCALCIFEROL 1.25 MG (50000 UT) PO CAPS: 50000.0000 [IU] | ORAL_CAPSULE | ORAL | Status: DC

## 2012-07-31 MED ORDER — FERROUS SULFATE 325 (65 FE) MG PO TBEC: 325.0000 mg | DELAYED_RELEASE_TABLET | Freq: Every day | ORAL | Status: DC

## 2012-07-31 NOTE — Telephone Encounter (Signed)
Lab results-low vit D and iron levels. i have sent rx to pharmacy. Advised f/u levels in 2 months and advised to make appt for recheck after holidays. She agrees.

## 2012-09-27 ENCOUNTER — Other Ambulatory Visit: Payer: Self-pay | Admitting: Family Medicine

## 2012-10-31 LAB — HM DIABETES EYE EXAM

## 2012-11-05 ENCOUNTER — Other Ambulatory Visit: Payer: Self-pay | Admitting: Family Medicine

## 2012-12-17 ENCOUNTER — Other Ambulatory Visit: Payer: Self-pay | Admitting: Family Medicine

## 2013-01-13 ENCOUNTER — Other Ambulatory Visit: Payer: Self-pay | Admitting: Family Medicine

## 2013-01-21 ENCOUNTER — Telehealth: Payer: Self-pay | Admitting: Family Medicine

## 2013-01-21 NOTE — Telephone Encounter (Signed)
Pt states her blood sugars are increasing to over 300. Today it was 175. Average is 100. She is taking 500 mg of metaformin per dr Verlee Rossetti.  Since this was recently reduced, should she go back to 1000 mg/ day? Please advise

## 2013-01-21 NOTE — Telephone Encounter (Signed)
Spoke with patient.  She had one reading of 300 last week. She reports no other complaints.  Last OV was 07/2012.  I instructed patient to call back and schedule appt with Dr. Verlee Rossetti and to continue her medications as prescribed.  Pt. Verbalized understanding.  Kameren Baade, [place]yn [place], [place]

## 2013-01-30 ENCOUNTER — Ambulatory Visit (INDEPENDENT_AMBULATORY_CARE_PROVIDER_SITE_OTHER): Payer: Medicare PPO | Admitting: Family Medicine

## 2013-01-30 ENCOUNTER — Encounter: Payer: Self-pay | Admitting: Family Medicine

## 2013-01-30 VITALS — BP 147/72 | HR 91 | Temp 97.7°F | Ht 62.5 in | Wt 166.6 lb

## 2013-01-30 DIAGNOSIS — E119 Type 2 diabetes mellitus without complications: Secondary | ICD-10-CM

## 2013-01-30 DIAGNOSIS — E559 Vitamin D deficiency, unspecified: Secondary | ICD-10-CM

## 2013-01-30 DIAGNOSIS — F329 Major depressive disorder, single episode, unspecified: Secondary | ICD-10-CM

## 2013-01-30 DIAGNOSIS — F339 Major depressive disorder, recurrent, unspecified: Secondary | ICD-10-CM

## 2013-01-30 DIAGNOSIS — F32A Depression, unspecified: Secondary | ICD-10-CM

## 2013-01-30 LAB — POCT URINALYSIS DIPSTICK
Bilirubin, UA: NEGATIVE
Nitrite, UA: NEGATIVE
Urobilinogen, UA: NEGATIVE
pH, UA: 5.5

## 2013-01-30 LAB — BASIC METABOLIC PANEL
BUN: 27 mg/dL — ABNORMAL HIGH (ref 6–23)
Calcium: 9.2 mg/dL (ref 8.4–10.5)
Potassium: 4.2 mEq/L (ref 3.5–5.3)
Sodium: 141 mEq/L (ref 135–145)

## 2013-01-30 MED ORDER — FENOFIBRATE 54 MG PO TABS: ORAL_TABLET | ORAL | Status: DC

## 2013-01-30 MED ORDER — METFORMIN HCL 1000 MG PO TABS
500.0000 mg | ORAL_TABLET | Freq: Two times a day (BID) | ORAL | Status: DC
Start: 2013-01-30 — End: 2013-10-31

## 2013-01-30 MED ORDER — CITALOPRAM HYDROBROMIDE 40 MG PO TABS: 40.0000 mg | ORAL_TABLET | Freq: Every day | ORAL | Status: DC

## 2013-01-30 MED ORDER — LISINOPRIL 5 MG PO TABS: ORAL_TABLET | ORAL | Status: DC

## 2013-01-30 NOTE — Addendum Note (Signed)
Addended by: Howard Pouch A on: 01/30/2013 03:22 PM   Modules accepted: Orders

## 2013-01-30 NOTE — Assessment & Plan Note (Signed)
Well controlled even on lower dose metformin. I discussed with patient she may not even need metformin at this point. She refers to continue the dose at 500 mg twice a day. Will check renal function today. Continue low-dose ACE inhibitor. Will followup in 4-6 months.

## 2013-01-30 NOTE — Progress Notes (Signed)
  Subjective:     Jill [name] is a 79 y.o. female and is here for a comprehensive physical exam. The patient reports problems -   1. DM2. she had noted one blood sugar value over 300 since she decreased her metformin dose. She denies any hypoglycemic events.   2. Patient also reports a left low back pain that is intermittent. She has not had consistent dysuria, fever, chills, abdominal pain. She does have frequent urination.  3. depression. Patient notes increased emotional lability, crying spells and stress related to her husband's decline with dementia. This also comes after we discussed decreasing her Celexa dose at last visit from 40 mg to 20 mg. She is interested in going back on the higher dose. Patient denies any hallucinations, SI.  History   Social History  . Marital Status: Married    Spouse Name: N/A    Number of Children: N/A  . Years of Education: N/A   Occupational History  . Not on file.   Social History Main Topics  . Smoking status: Former Smoker    Quit date: 06/16/1999  . Smokeless tobacco: Not on file  . Alcohol Use: No  . Drug Use: No  . Sexually Active: Not on file   Other Topics Concern  . 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 Urine Microalbumin  06/15/2012  . Tetanus/tdap  02/19/2013  . Influenza Vaccine  04/22/2013  . Colonoscopy  06/15/2013  . Foot Exam  07/25/2013  . Ophthalmology Exam  08/21/2013  . Pneumococcal Polysaccharide Vaccine Age 40 And Over  Addressed  . Zostavax  Addressed    The following portions of the patient's history were reviewed and updated as appropriate:  allergies, current medications, past family history, past medical history, past social history, past surgical history and problem list.  Review of Systems Pertinent items are noted in HPI.   Objective:    BP 147/72  Pulse 91  Temp(Src) 97.7 F (36.5 C) (Oral)  Ht 5' 2.5" (1.588 m)  Wt 166 lb 9.6 oz (75.569 kg)  BMI 29.97 kg/m2 General appearance: alert, cooperative, appears stated age and no distress Head: Normocephalic, without obvious abnormality, atraumatic Eyes: conjunctivae/corneas clear. PERRL, EOM's intact. Ears: normal TM's and external ear canals both ears Throat: lips, mucosa, and tongue normal; teeth and gums normal Neck: no adenopathy, supple, symmetrical, trachea midline and thyroid not enlarged, symmetric, no tenderness/mass/nodules Lungs: clear to auscultation bilaterally Heart: regular rate and rhythm, S1, S2 normal, no murmur, click, rub or gallop Abdomen: soft, non-tender; bowel sounds normal; no masses,  no organomegaly Extremities: extremities normal, atraumatic, no cyanosis or edema Pulses: 2+ and symmetric Skin: Skin color, texture, turgor normal. No rashes or lesions Neurologic: Alert and oriented X 3, normal strength and tone. Normal symmetric reflexes. Normal coordination and gait    Assessment:    Healthy female exam. Diabetes mellitus 2 well-controlled. Low back pain. Depression, worsened.       Plan:     See After Visit Summary for Counseling Recommendations

## 2013-01-30 NOTE — Assessment & Plan Note (Signed)
Patient did not do well on a reduced dose of Celexa. We discussed risk and benefits. She prefers to increase the dose back to 40 mg at this time. She agrees to notify me if any side effects or if the medication does not improve her symptoms.

## 2013-01-30 NOTE — Assessment & Plan Note (Signed)
Repeat vitamin D level. She is currently taking daily OTC supplement and finished the 50000 replacement course.

## 2013-01-30 NOTE — Patient Instructions (Addendum)
Your A1c is good.  You may need even less metformin.  You dont have to check blood sugars every day. Let me know if your citalopram causes side effects. I will send letter or call about results. Get your bone density and mammogram done again.

## 2013-01-31 ENCOUNTER — Telehealth: Payer: Self-pay | Admitting: Family Medicine

## 2013-01-31 LAB — VITAMIN D 25 HYDROXY (VIT D DEFICIENCY, FRACTURES): Vit D, 25-Hydroxy: 34 ng/mL (ref 30–89)

## 2013-01-31 MED ORDER — CEPHALEXIN 500 MG PO CAPS: 500.0000 mg | ORAL_CAPSULE | Freq: Three times a day (TID) | ORAL | Status: DC

## 2013-01-31 NOTE — Telephone Encounter (Signed)
No answer on home line. Tried to call patient with urine results showing a likely infection. Please advise her to pick up antibiotic keflex for 7 days and notify me if symptoms dont improve.

## 2013-01-31 NOTE — Telephone Encounter (Signed)
Pt notified.  [name] L, CMA  

## 2013-02-28 ENCOUNTER — Other Ambulatory Visit: Payer: Self-pay

## 2013-07-21 ENCOUNTER — Ambulatory Visit (INDEPENDENT_AMBULATORY_CARE_PROVIDER_SITE_OTHER): Payer: Medicare HMO | Admitting: Internal Medicine

## 2013-07-21 VITALS — BP 135/70 | HR 88 | Temp 98.2°F | Resp 24 | Ht 62.5 in | Wt 170.0 lb

## 2013-07-21 DIAGNOSIS — J019 Acute sinusitis, unspecified: Secondary | ICD-10-CM

## 2013-07-21 DIAGNOSIS — R05 Cough: Secondary | ICD-10-CM

## 2013-07-21 MED ORDER — HYDROCODONE-HOMATROPINE 5-1.5 MG/5ML PO SYRP: 5.0000 mL | ORAL_SOLUTION | Freq: Four times a day (QID) | ORAL | Status: DC | PRN

## 2013-07-21 MED ORDER — AMOXICILLIN 875 MG PO TABS: 875.0000 mg | ORAL_TABLET | Freq: Two times a day (BID) | ORAL | Status: DC

## 2013-07-21 NOTE — Progress Notes (Signed)
   Subjective:    Patient ID: Jill [name], female    DOB: [birth year], 79 y.o.   MRN: [id number]  HPI Nonprod hacking cough 2-3 weeks occas ST/Nose congestion Fatigue No fever Husband hosp w/ parkinson's compl--lots of caretaking    Patient Active Problem List   Diagnosis Date Noted  . FATIGUE 05/18/2010  . ALLERGIC RHINITIS 12/17/2008  . SYNCOPE 10/27/2008  . CHEST PAIN 10/27/2008  . VITAMIN D DEFICIENCY 07/01/2008  . HIP PAIN, RIGHT, CHRONIC 07/01/2008  . HYPERKALEMIA 02/13/2008  . CHRONIC KIDNEY DISEASE STAGE III (MODERATE) 02/11/2008  . DIVERTICULOSIS OF COLON 02/01/2008  . COLONIC POLYPS, ADENOMATOUS 10/19/2006  . DIABETES MELLITUS II, UNCOMPLICATED [id number]  . HYPERLIPIDEMIA 10/19/2006  . OBESITY, NOS 10/19/2006  . DEPRESSION, MAJOR, RECURRENT 10/19/2006  . HYPERTENSION, BENIGN SYSTEMIC 10/19/2006  . Recurrent Urticaria 10/19/2006  Current outpatient prescriptions:aspirin 81 MG chewable tablet, Chew 81 mg by mouth daily.  , Disp: , Rfl: ;  citalopram (CELEXA) 40 MG tablet, Take 1 tablet (40 mg total) by mouth daily., Disp: 90 tablet, Rfl: 1;  fenofibrate 54 MG tablet, take 2 tablets by mouth once daily, Disp: 180 tablet, Rfl: 1;  ferrous sulfate 325 (65 FE) MG EC tablet, Take 1 tablet (325 mg total) by mouth daily with breakfast., Disp: 90 tablet, Rfl: 1 glucose blood (BLOOD GLUCOSE TEST STRIPS) test strip, One Touch-Use as directed-2 to 3 times a day , Disp: , Rfl: ;  lisinopril (PRINIVIL,ZESTRIL) 5 MG tablet, take 1/2 tablet by mouth once daily, Disp: 90 tablet, Rfl: 1;  metFORMIN (GLUCOPHAGE) 1000 MG tablet, Take 0.5 tablets (500 mg total) by mouth 2 (two) times daily with a meal., Disp: 90 tablet, Rfl: 1 Cholecalciferol (VITAMIN D3) 1000 UNIT capsule, Take 1,000 Units by mouth daily.  , Disp: , Rfl: ;  ergocalciferol (VITAMIN D2) 50000 UNITS capsule, Take 1 capsule (50,000 Units total) by mouth once a week., Disp: 4 capsule, Rfl: 3    Review of Systems noncontr      Objective:   Physical Exam BP 135/70  Pulse 88  Temp(Src) 98.2 F (36.8 C) (Oral)  Resp 24  Ht 5' 2.5" (1.588 m)  Wt 170 lb (77.111 kg)  BMI 30.58 kg/m2  SpO2 91% Tms cl Nares pur d/c  Tender max sin thr cl No nodes  chest clear      Assessment & Plan:  Cough  Acute sinusitis, unspecified  Meds ordered this encounter  Medications  . amoxicillin (AMOXIL) 875 MG tablet    Sig: Take 1 tablet (875 mg total) by mouth 2 (two) times daily.    Dispense:  20 tablet    Refill:  0  . HYDROcodone-homatropine (HYCODAN) 5-1.5 MG/5ML syrup    Sig: Take 5 mLs by mouth every 6 (six) hours as needed for cough.    Dispense:  120 mL    Refill:  0   Reck 1 w

## 2013-10-17 ENCOUNTER — Ambulatory Visit: Payer: Medicare PPO | Admitting: Family Medicine

## 2013-10-31 ENCOUNTER — Ambulatory Visit (INDEPENDENT_AMBULATORY_CARE_PROVIDER_SITE_OTHER): Payer: Medicare PPO | Admitting: Family Medicine

## 2013-10-31 ENCOUNTER — Encounter: Payer: Self-pay | Admitting: Family Medicine

## 2013-10-31 VITALS — BP 120/72 | HR 80 | Temp 98.1°F | Ht 62.0 in | Wt 170.5 lb

## 2013-10-31 DIAGNOSIS — F3289 Other specified depressive episodes: Secondary | ICD-10-CM

## 2013-10-31 DIAGNOSIS — F32A Depression, unspecified: Secondary | ICD-10-CM

## 2013-10-31 DIAGNOSIS — I10 Essential (primary) hypertension: Secondary | ICD-10-CM

## 2013-10-31 DIAGNOSIS — F329 Major depressive disorder, single episode, unspecified: Secondary | ICD-10-CM

## 2013-10-31 DIAGNOSIS — E119 Type 2 diabetes mellitus without complications: Secondary | ICD-10-CM

## 2013-10-31 DIAGNOSIS — E785 Hyperlipidemia, unspecified: Secondary | ICD-10-CM

## 2013-10-31 LAB — HM DIABETES FOOT EXAM

## 2013-10-31 MED ORDER — CITALOPRAM HYDROBROMIDE 40 MG PO TABS: 40.0000 mg | ORAL_TABLET | Freq: Every day | ORAL | Status: DC

## 2013-10-31 MED ORDER — METFORMIN HCL 1000 MG PO TABS: 500.0000 mg | ORAL_TABLET | Freq: Two times a day (BID) | ORAL | Status: DC

## 2013-10-31 MED ORDER — FENOFIBRATE 54 MG PO TABS: ORAL_TABLET | ORAL | Status: DC

## 2013-10-31 MED ORDER — LISINOPRIL 5 MG PO TABS: ORAL_TABLET | ORAL | Status: DC

## 2013-10-31 NOTE — Patient Instructions (Signed)
Schedule medicare wellness in 01/2014 with fasting labs prior.

## 2013-10-31 NOTE — Assessment & Plan Note (Addendum)
Well controlled. Continue current medication. Encouraged exercise, weight loss, healthy eating habits.  

## 2013-10-31 NOTE — Progress Notes (Signed)
   Subjective:    Patient ID: Jill [name], female    DOB: 1931/09/07, 80 y.o.   MRN: [id number]  HPI  80 year old female presents to establish care. She has been managed by [facility]. Seen yearly.   Last seen in 01/2013 for DM.    Depression, stable on celexa. Tried last year to decrease to 20 mg but needed to increase back to 40 mg .   Diabetes:  Well controlled on  metformin. Lab Results  Component Value Date   HGBA1C 6.3 01/30/2013  Using medications without difficulties:None Hypoglycemic episodes:None Hyperglycemic episodes:None Feet problems:None Blood Sugars averaging: not checking regularly Eye exam within last year:due  He husband has Parkinson's and continues to decline. Very dependant on her.  Elevated Cholesterol: She is intolerant of  multiple statin. Not at goal on fenofibrate Lab Results  Component Value Date   CHOL 231* 06/16/2011   HDL 66 06/16/2011   LDLCALC 137* 06/16/2011   LDLDIRECT 137.6 11/10/2008   TRIG 141 06/16/2011   CHOLHDL 3.5 06/16/2011   HTN:  At goal on lisinopril BP Readings from Last 3 Encounters:  10/31/13 120/72  07/21/13 135/70  01/30/13 147/72   CKD stage 3 likely from HTN and DM. Cr baseline 1.28-1.62  Review of Systems  Constitutional: Negative for fever and fatigue.  HENT: Negative for ear pain.   Eyes: Negative for pain.  Respiratory: Negative for chest tightness and shortness of breath.   Cardiovascular: Negative for chest pain, palpitations and leg swelling.  Gastrointestinal: Negative for abdominal pain.  Genitourinary: Negative for dysuria.       Objective:   Physical Exam  Constitutional: Vital signs are normal. She appears well-developed and well-nourished. She is cooperative.  Non-toxic appearance. She does not appear ill. No distress.  HENT:  Head: Normocephalic.  Right Ear: Hearing, tympanic membrane, external ear and ear canal normal.  Left Ear: Hearing, tympanic membrane, external ear and  ear canal normal.  Nose: Nose normal.  Eyes: Conjunctivae, EOM and lids are normal. Pupils are equal, round, and reactive to light. Lids are everted and swept, no foreign bodies found.  Neck: Trachea normal and normal range of motion. Neck supple. Carotid bruit is not present. No mass and no thyromegaly present.  Cardiovascular: Normal rate, regular rhythm, S1 normal, S2 normal, normal heart sounds and intact distal pulses.  Exam reveals no gallop.   No murmur heard. Pulmonary/Chest: Effort normal and breath sounds normal. No respiratory distress. She has no wheezes. She has no rhonchi. She has no rales.  Abdominal: Soft. Normal appearance and bowel sounds are normal. She exhibits no distension, no fluid wave, no abdominal bruit and no mass. There is no hepatosplenomegaly. There is no tenderness. There is no rebound, no guarding and no CVA tenderness. No hernia.  Lymphadenopathy:    She has no cervical adenopathy.    She has no axillary adenopathy.  Neurological: She is alert. She has normal strength. No cranial nerve deficit or sensory deficit.  Skin: Skin is warm, dry and intact. No rash noted.  Psychiatric: Her speech is normal and behavior is normal. Judgment normal. Her mood appears not anxious. Cognition and memory are normal. She does not exhibit a depressed mood.  Diabetic foot exam: Normal inspection No skin breakdown No calluses  Normal DP pulses Normal sensation to light touch and monofilament Nails normal         Assessment & Plan:

## 2013-10-31 NOTE — Progress Notes (Signed)
Pre visit review using our clinic review tool, if applicable. No additional management support is needed unless otherwise documented below in the visit note. 

## 2013-10-31 NOTE — Assessment & Plan Note (Signed)
Not at goal at last check. Cannot tolerate statin.

## 2013-10-31 NOTE — Assessment & Plan Note (Signed)
Well controlled. Continue current medication.  

## 2013-11-06 ENCOUNTER — Telehealth: Payer: Self-pay

## 2013-11-06 NOTE — Telephone Encounter (Signed)
Relevant patient education mailed to patient.  

## 2014-01-27 LAB — HM DIABETES EYE EXAM

## 2014-02-05 ENCOUNTER — Encounter: Payer: Self-pay | Admitting: *Deleted

## 2014-02-05 ENCOUNTER — Encounter: Payer: Self-pay | Admitting: Family Medicine

## 2014-02-12 ENCOUNTER — Telehealth: Payer: Self-pay | Admitting: Family Medicine

## 2014-02-12 DIAGNOSIS — N183 Chronic kidney disease, stage 3 unspecified: Secondary | ICD-10-CM

## 2014-02-12 DIAGNOSIS — E875 Hyperkalemia: Secondary | ICD-10-CM

## 2014-02-12 DIAGNOSIS — E785 Hyperlipidemia, unspecified: Secondary | ICD-10-CM

## 2014-02-12 DIAGNOSIS — E119 Type 2 diabetes mellitus without complications: Secondary | ICD-10-CM

## 2014-02-12 DIAGNOSIS — E559 Vitamin D deficiency, unspecified: Secondary | ICD-10-CM

## 2014-02-12 NOTE — Telephone Encounter (Signed)
Message copied by Jinny Sanders on Wed Feb 12, 2014 10:18 PM ------      Message from: Ellamae Sia      Created: Tue Feb 04, 2014  3:48 PM      Regarding: Lab orders for Thursday, 6.25.15       Patient is scheduled for CPX labs, please order future labs, Thanks , Terri       ------

## 2014-02-13 ENCOUNTER — Other Ambulatory Visit (INDEPENDENT_AMBULATORY_CARE_PROVIDER_SITE_OTHER): Payer: Medicare PPO

## 2014-02-13 DIAGNOSIS — E559 Vitamin D deficiency, unspecified: Secondary | ICD-10-CM

## 2014-02-13 DIAGNOSIS — E119 Type 2 diabetes mellitus without complications: Secondary | ICD-10-CM

## 2014-02-13 DIAGNOSIS — N183 Chronic kidney disease, stage 3 unspecified: Secondary | ICD-10-CM

## 2014-02-13 DIAGNOSIS — E785 Hyperlipidemia, unspecified: Secondary | ICD-10-CM

## 2014-02-13 LAB — COMPREHENSIVE METABOLIC PANEL
ALT: 7 U/L (ref 0–35)
AST: 16 U/L (ref 0–37)
Albumin: 3.5 g/dL (ref 3.5–5.2)
Alkaline Phosphatase: 57 U/L (ref 39–117)
BUN: 31 mg/dL — ABNORMAL HIGH (ref 6–23)
CHLORIDE: 106 meq/L (ref 96–112)
CO2: 29 mEq/L (ref 19–32)
CREATININE: 1.7 mg/dL — AB (ref 0.4–1.2)
Calcium: 9.3 mg/dL (ref 8.4–10.5)
GFR: 31.37 mL/min — ABNORMAL LOW (ref >60.00)
Glucose, Bld: 117 mg/dL — ABNORMAL HIGH (ref 70–99)
Potassium: 4.3 mEq/L (ref 3.5–5.1)
Sodium: 141 mEq/L (ref 135–145)
Total Bilirubin: 0.4 mg/dL (ref 0.2–1.2)
Total Protein: 6.6 g/dL (ref 6.0–8.3)

## 2014-02-13 LAB — LIPID PANEL
CHOL/HDL RATIO: 3
Cholesterol: 209 mg/dL — ABNORMAL HIGH (ref 0–200)
HDL: 64.2 mg/dL (ref >39.00)
LDL Cholesterol: 123 mg/dL — ABNORMAL HIGH (ref 0–99)
NonHDL: 144.8
Triglycerides: 109 mg/dL (ref 0.0–149.0)
VLDL: 21.8 mg/dL (ref 0.0–40.0)

## 2014-02-13 LAB — HEMOGLOBIN A1C: Hgb A1c MFr Bld: 7.4 % — ABNORMAL HIGH (ref 4.6–6.5)

## 2014-02-13 LAB — VITAMIN D 25 HYDROXY (VIT D DEFICIENCY, FRACTURES): VITD: 35.18 ng/mL

## 2014-02-20 ENCOUNTER — Ambulatory Visit (INDEPENDENT_AMBULATORY_CARE_PROVIDER_SITE_OTHER): Payer: Medicare PPO | Admitting: Family Medicine

## 2014-02-20 ENCOUNTER — Encounter: Payer: Self-pay | Admitting: Family Medicine

## 2014-02-20 VITALS — BP 160/82 | HR 78 | Temp 98.4°F | Ht 62.0 in | Wt 175.5 lb

## 2014-02-20 DIAGNOSIS — Z23 Encounter for immunization: Secondary | ICD-10-CM

## 2014-02-20 DIAGNOSIS — N183 Chronic kidney disease, stage 3 unspecified: Secondary | ICD-10-CM

## 2014-02-20 DIAGNOSIS — E785 Hyperlipidemia, unspecified: Secondary | ICD-10-CM

## 2014-02-20 DIAGNOSIS — M76899 Other specified enthesopathies of unspecified lower limb, excluding foot: Secondary | ICD-10-CM

## 2014-02-20 DIAGNOSIS — E119 Type 2 diabetes mellitus without complications: Secondary | ICD-10-CM

## 2014-02-20 DIAGNOSIS — I10 Essential (primary) hypertension: Secondary | ICD-10-CM

## 2014-02-20 DIAGNOSIS — Z66 Do not resuscitate: Secondary | ICD-10-CM

## 2014-02-20 DIAGNOSIS — R350 Frequency of micturition: Secondary | ICD-10-CM

## 2014-02-20 DIAGNOSIS — F339 Major depressive disorder, recurrent, unspecified: Secondary | ICD-10-CM

## 2014-02-20 DIAGNOSIS — M7062 Trochanteric bursitis, left hip: Secondary | ICD-10-CM

## 2014-02-20 DIAGNOSIS — Z0000 Encounter for general adult medical examination without abnormal findings: Secondary | ICD-10-CM

## 2014-02-20 LAB — POCT URINALYSIS DIPSTICK
BILIRUBIN UA: NEGATIVE
GLUCOSE UA: NEGATIVE
KETONES UA: NEGATIVE
Nitrite, UA: NEGATIVE
PH UA: 6
Protein, UA: NEGATIVE
Urobilinogen, UA: 0.2

## 2014-02-20 LAB — POCT UA - MICROSCOPIC ONLY

## 2014-02-20 MED ORDER — LISINOPRIL 5 MG PO TABS: 2.5000 mg | ORAL_TABLET | Freq: Every day | ORAL | Status: DC

## 2014-02-20 NOTE — Assessment & Plan Note (Signed)
Return to Dr. Hal Morales for likely steroid injection in left hip.

## 2014-02-20 NOTE — Assessment & Plan Note (Signed)
Restart lisinopril 

## 2014-02-20 NOTE — Assessment & Plan Note (Signed)
INadequate control, will work on lifestyle changes.

## 2014-02-20 NOTE — Progress Notes (Signed)
I have personally reviewed the Medicare Annual Wellness questionnaire and have noted 1. The patient's medical and social history 2. Their use of alcohol, tobacco or illicit drugs 3. Their current medications and supplements 4. The patient's functional ability including ADL's, fall risks, home safety risks and hearing or visual             impairment. 5. Diet and physical activities 6. Evidence for depression or mood disorders The patients weight, height, BMI and visual acuity have been recorded in the chart I have made referrals, counseling and provided education to the patient based review of the above and I have provided the pt with a written personalized care plan for preventive services.  Depression, stable on celexa.  She is handling death of husband well. He died on parkinson's in 2014-01-09.  She has been having some pain in left hip.  Dr. Hal Morales was surgeon for right replacement.  She had 2 accidental falls.   Fell off commode in middle of night. She does have bedside commode but she did not use. No injury except minor bruises.   She did have episode this week after being outside in garden, got very hot, shaky not drinking much. Felt presyncopal, went inside ate and drank and felt better.  No proceeding palpitations, chest pain, no headache.  Urine frequency and urgency at night  noted in last few months. She does not drink much fluid. No fever, no abdominal pain, no dysuria  CKD stage 3 likely from HTN and DM. Cr baseline 1.28-1.62     Diabetes:previously well controlled on metformin.  In last few months she has been eating junk. Lab Results  Component Value Date   HGBA1C 7.4* 02/13/2014  Using medications without difficulties:None  Hypoglycemic episodes:None  Hyperglycemic episodes:None  Feet problems:None  Blood Sugars averaging: not checking regularly  Eye exam within last year:due    Elevated Cholesterol: She is intolerant of multiple statin. Not at goal on  fenofibrate.  Lab Results   Component  Value  Date    CHOL  231*  06/16/2011    HDL  66  06/16/2011    LDLCALC  137*  06/16/2011    LDLDIRECT  137.6  11/10/2008    TRIG  141  06/16/2011    CHOLHDL  3.5  06/16/2011    HTN: At goal previously, but has been out of  Lisinopril lately. BP Readings from Last 3 Encounters:  02/20/14 160/82  10/31/13 120/72  07/21/13 135/70     Review of Systems  Constitutional: Negative for fever and fatigue.  HENT: Negative for ear pain.  Eyes: Negative for pain.  Respiratory: Negative for chest tightness and shortness of breath.  Cardiovascular: Negative for chest pain, palpitations and leg swelling.  Gastrointestinal: Negative for abdominal pain.  Genitourinary: Negative for dysuria.  Objective:   Physical Exam  Constitutional: Vital signs are normal. She appears well-developed and well-nourished. She is cooperative. Non-toxic appearance. She does not appear ill. No distress.  HENT:  Head: Normocephalic.  Right Ear: Hearing, tympanic membrane, external ear and ear canal normal.  Left Ear: Hearing, tympanic membrane, external ear and ear canal normal.  Nose: Nose normal.  Eyes: Conjunctivae, EOM and lids are normal. Pupils are equal, round, and reactive to light. Lids are everted and swept, no foreign bodies found.  Neck: Trachea normal and normal range of motion. Neck supple. Carotid bruit is not present. No mass and no thyromegaly present.  Cardiovascular: Normal rate, regular rhythm, S1 normal, S2 normal,  normal heart sounds and intact distal pulses. Exam reveals no gallop.  No murmur heard.  Pulmonary/Chest: Effort normal and breath sounds normal. No respiratory distress. She has no wheezes. She has no rhonchi. She has no rales.  Abdominal: Soft. Normal appearance and bowel sounds are normal. She exhibits no distension, no fluid wave, no abdominal bruit and no mass. There is no hepatosplenomegaly. There is no tenderness. There is no rebound, no  guarding and no CVA tenderness. No hernia.  Lymphadenopathy:  She has no cervical adenopathy.  She has no axillary adenopathy.  Neurological: She is alert. She has normal strength. No cranial nerve deficit or sensory deficit.  Skin: Skin is warm, dry and intact. No rash noted.  Psychiatric: Her speech is normal and behavior is normal. Judgment normal. Her mood appears not anxious. Cognition and memory are normal. She does not exhibit a depressed mood.    MSK: ttp in left lateral hip over bursa  Diabetic foot exam:  Normal inspection  No skin breakdown  No calluses  Normal DP pulses  Normal sensation to light touch and monofilament  Nails normal    ASSESSMENT AND PLAN:  The patient's preventative maintenance and recommended screening tests for an annual wellness exam were reviewed in full today. Brought up to date unless services declined.  Counselled on the importance of diet, exercise, and its role in overall health and mortality. The patient's FH and SH was reviewed, including their home life, tobacco status, and drug and alcohol status.   Vaccines:Uptodate pneumovax, due for prevnar and TDAp PAP/DVE: not indicated  Mammo: not indicated  DEXA: last 2009, normal, she refuses repeat today  Colon: last one several years ago, nml per pt, Dr. Collene Mares. No family history of colon cancer

## 2014-02-20 NOTE — Assessment & Plan Note (Signed)
Gradually worsening over past year.

## 2014-02-20 NOTE — Assessment & Plan Note (Signed)
Worsened control, get back on track with diet.  HAs been poor since death of husband.

## 2014-02-20 NOTE — Patient Instructions (Addendum)
Get back on track with healthy eating ( low carb and low fat). Try to walk some for exercsie as tolerated. Increase water in take some.  Restart lisinopril.  Return in 1 week for lab only.  MAke appt with Dr. Hal Morales for likely trochanteric bursitis.

## 2014-02-20 NOTE — Assessment & Plan Note (Signed)
Stable

## 2014-02-22 LAB — URINE CULTURE: Colony Count: 25000

## 2014-02-24 ENCOUNTER — Telehealth: Payer: Self-pay | Admitting: Family Medicine

## 2014-02-24 NOTE — Telephone Encounter (Signed)
Relevant patient education mailed to patient.  

## 2014-02-27 ENCOUNTER — Other Ambulatory Visit (INDEPENDENT_AMBULATORY_CARE_PROVIDER_SITE_OTHER): Payer: Medicare PPO

## 2014-02-27 DIAGNOSIS — N183 Chronic kidney disease, stage 3 unspecified: Secondary | ICD-10-CM

## 2014-02-27 LAB — BASIC METABOLIC PANEL
BUN: 33 mg/dL — AB (ref 6–23)
CO2: 25 mEq/L (ref 19–32)
Calcium: 9.2 mg/dL (ref 8.4–10.5)
Chloride: 99 mEq/L (ref 96–112)
Creatinine, Ser: 1.9 mg/dL — ABNORMAL HIGH (ref 0.4–1.2)
GFR: 27.36 mL/min — AB (ref >60.00)
GLUCOSE: 275 mg/dL — AB (ref 70–99)
POTASSIUM: 4.8 meq/L (ref 3.5–5.1)
SODIUM: 134 meq/L — AB (ref 135–145)

## 2014-03-04 ENCOUNTER — Other Ambulatory Visit: Payer: Self-pay | Admitting: Family Medicine

## 2014-03-04 DIAGNOSIS — N183 Chronic kidney disease, stage 3 unspecified: Secondary | ICD-10-CM

## 2014-03-06 ENCOUNTER — Other Ambulatory Visit (INDEPENDENT_AMBULATORY_CARE_PROVIDER_SITE_OTHER): Payer: Medicare PPO

## 2014-03-06 DIAGNOSIS — N183 Chronic kidney disease, stage 3 unspecified: Secondary | ICD-10-CM

## 2014-03-06 LAB — BASIC METABOLIC PANEL
BUN: 28 mg/dL — ABNORMAL HIGH (ref 6–23)
CHLORIDE: 100 meq/L (ref 96–112)
CO2: 31 mEq/L (ref 19–32)
Calcium: 8.9 mg/dL (ref 8.4–10.5)
Creatinine, Ser: 1.8 mg/dL — ABNORMAL HIGH (ref 0.4–1.2)
GFR: 29.14 mL/min — ABNORMAL LOW (ref >60.00)
Glucose, Bld: 275 mg/dL — ABNORMAL HIGH (ref 70–99)
Potassium: 4.9 mEq/L (ref 3.5–5.1)
SODIUM: 135 meq/L (ref 135–145)

## 2014-04-11 ENCOUNTER — Other Ambulatory Visit: Payer: Self-pay | Admitting: Orthopedic Surgery

## 2014-04-14 ENCOUNTER — Encounter: Payer: Self-pay | Admitting: *Deleted

## 2014-04-15 ENCOUNTER — Encounter: Payer: Self-pay | Admitting: Cardiology

## 2014-04-15 ENCOUNTER — Ambulatory Visit (INDEPENDENT_AMBULATORY_CARE_PROVIDER_SITE_OTHER): Payer: Medicare PPO | Admitting: Cardiology

## 2014-04-15 VITALS — BP 124/76 | HR 84 | Ht 62.0 in | Wt 177.0 lb

## 2014-04-15 DIAGNOSIS — I10 Essential (primary) hypertension: Secondary | ICD-10-CM

## 2014-04-15 DIAGNOSIS — E785 Hyperlipidemia, unspecified: Secondary | ICD-10-CM

## 2014-04-15 DIAGNOSIS — Z01818 Encounter for other preprocedural examination: Secondary | ICD-10-CM | POA: Insufficient documentation

## 2014-04-15 DIAGNOSIS — N183 Chronic kidney disease, stage 3 unspecified: Secondary | ICD-10-CM

## 2014-04-15 DIAGNOSIS — E119 Type 2 diabetes mellitus without complications: Secondary | ICD-10-CM

## 2014-04-15 DIAGNOSIS — R079 Chest pain, unspecified: Secondary | ICD-10-CM

## 2014-04-15 DIAGNOSIS — R55 Syncope and collapse: Secondary | ICD-10-CM

## 2014-04-15 NOTE — Patient Instructions (Signed)
Your physician recommends that you continue on your current medications as directed. Please refer to the Current Medication list given to you today.  YOUR CARDIOLOGIST HAS CLEARED YOU TO GET YOUR HIP REPLACEMENT SURGERY   FOLLOW UP WITH DR Meda Coffee ON A AS NEEDED BASIS

## 2014-04-15 NOTE — Progress Notes (Signed)
Patient ID: ALYX MCGUIRK, female   DOB: Feb 14, 1932, 80 y.o.   MRN: [id number]    Patient Name: Jill [name] Date of Encounter: 04/15/2014  Primary Care Provider:  Eliezer Lofts, MD Primary Cardiologist:  Dorothy Spark  Problem List   Past Medical History  Diagnosis Date  . Diabetes mellitus   . Hyperlipidemia   . Chronic kidney disease   . Depression   . Allergy   . Hypertension   . Vasovagal syncope 2006    Negative cardiac workup-myoview, echo  . Diverticula, colon    Past Surgical History  Procedure Laterality Date  . Abdominal hysterectomy  1977    fibroid  . Joint replacement  2009    rt hip    Allergies  Allergies  Allergen Reactions  . Lovastatin     REACTION: leg pain  . Niacin   . Statins     REACTION: leg cramps, weakness  . Codeine Rash    HPI  A very pleasant 80 year old female with prior medical history of well controlled hypertension and non-insulin-dependent diabetes mellitus who is coming for preoperative consultation prior to her hip replacement. The patient has no history of coronary artery disease or heart failure. She was seen by Dr. Olevia Perches in 2010 prior to her other hip replacement. The patient is severe limited in her activities of daily secondary to hip pain however she is still very independent, able to get her own grocery shopping and taking care of her 48-year-old. She denies any shortness of breath, chest pain, palpitations or syncope. She admits to occasional dizziness especially on a hot day when she is dehydrated. She has no lower extremity edema, orthopnea or personal nocturnal dyspnea.  Home Medications  Prior to Admission medications   Medication Sig Start Date End Date Taking? Authorizing Provider  aspirin 81 MG chewable tablet Chew 81 mg by mouth daily.      Historical Provider, MD  citalopram (CELEXA) 40 MG tablet Take 1 tablet (40 mg total) by mouth daily. 10/31/13   Amy Cletis [place], MD  fenofibrate 54 MG tablet take 2 tablets  by mouth once daily 10/31/13   Amy E Diona Browner, MD  glucose blood (BLOOD GLUCOSE TEST STRIPS) test strip One Touch-Use as directed-2 to 3 times a day     Historical Provider, MD  metFORMIN (GLUCOPHAGE) 500 MG tablet Take 250 mg by mouth 2 (two) times daily with a meal.    Historical Provider, MD    Family History  Family History  Problem Relation Age of Onset  . COPD Brother   . Heart disease Brother   . Diabetes Brother   . Lymphoma Brother   . Alzheimer's disease Brother   . Heart disease Mother   . Pancreatic cancer Sister   . Alzheimer's disease Sister   . Breast cancer Sister   . Leukemia Other     Social History  History   Social History  . Marital Status: Married    Spouse Name: N/A    Number of Children: N/A  . Years of Education: N/A   Occupational History  . Not on file.   Social History Main Topics  . Smoking status: Former Smoker -- 1.00 packs/day for 45 years    Types: Cigarettes    Quit date: 06/16/1999  . Smokeless tobacco: Never Used  . Alcohol Use: No  . Drug Use: No  . Sexual Activity: Not Currently   Other Topics Concern  . 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 Diet: healthy diet      Son Antony Haste Lucas) [place] , has living will and DNR ( reviewed 2015)                 Review of Systems, as per HPI, otherwise negative General:  No chills, fever, night sweats or weight changes.  Cardiovascular:  No chest pain, dyspnea on exertion, edema, orthopnea, palpitations, paroxysmal nocturnal dyspnea. Dermatological: No rash, lesions/masses Respiratory: No cough, dyspnea Urologic: No hematuria, dysuria Abdominal:   No nausea, vomiting, diarrhea, bright red blood per rectum, melena, or  hematemesis Neurologic:  No visual changes, wkns, changes in mental status. All other systems reviewed and are otherwise negative except as noted above.  Physical Exam  Blood pressure 124/76, pulse 84, height _0  (1.575 m), weight 177 lb (80.287 kg).  General: Pleasant, NAD Psych: Normal affect. Neuro: Alert and oriented X 3. Moves all extremities spontaneously. HEENT: Normal  Neck: Supple without bruits or JVD. Lungs:  Resp regular and unlabored, CTA. Heart: RRR no s3, s4, or murmurs. Abdomen: Soft, non-tender, non-distended, BS + x 4.  Extremities: No clubbing, cyanosis or edema. DP/PT/Radials 2+ and equal bilaterally.  Labs:  No results found for this basename: CKTOTAL, CKMB, TROPONINI,  in the last 72 hours Lab Results  Component Value Date   WBC 8.3 07/25/2012   HGB 11.8* 07/25/2012   HCT 36.1 07/25/2012   MCV 85.1 07/25/2012   PLT 371 07/25/2012    No results found for this basename: DDIMER   No components found with this basename: POCBNP,     Component Value Date/Time   NA 135 03/06/2014 0907   K 4.9 03/06/2014 0907   CL 100 03/06/2014 0907   CO2 31 03/06/2014 0907   GLUCOSE 275* 03/06/2014 0907   BUN 28* 03/06/2014 0907   CREATININE 1.8* 03/06/2014 0907   CREATININE 1.62* 01/30/2013 0928   CALCIUM 8.9 03/06/2014 0907   CALCIUM 9.1 02/12/2008 2351   PROT 6.6 02/13/2014 0739   ALBUMIN 3.5 02/13/2014 0739   AST 16 02/13/2014 0739   ALT 7 02/13/2014 0739   ALKPHOS 57 02/13/2014 0739   BILITOT 0.4 02/13/2014 0739   GFRNONAA 37* 01/24/2009 0403   GFRAA  Value: 45        The eGFR has been calculated using the MDRD equation. This calculation has not been validated in all clinical situations. eGFR's persistently <60 mL/min signify possible Chronic Kidney Disease.* 01/24/2009 0403   Lab Results  Component Value Date   CHOL 209* 02/13/2014   HDL 64.20 02/13/2014   LDLCALC 123* 02/13/2014   TRIG 109.0 02/13/2014   Accessory Clinical Findings  Echocardiogram - 2010 LEFT VENTRICLE: -  Left ventricular size was normal. - Overall left ventricular systolic function was normal. - Left ventricular ejection fraction was estimated to be 60 %. - There was no diagnostic evidence of left ventricular regional wall motion abnormalities. - Left ventricular wall thickness was normal. AORTIC VALVE: - The aortic valve was grossly normal. AORTA: - The aortic root was normal in size. MITRAL VALVE: - The mitral valve was grossly normal. LEFT ATRIUM: - Left atrial size was normal. RIGHT VENTRICLE: - Right ventricular size was normal. -  Right ventricular systolic function was normal. PULMONIC VALVE: - The pulmonic valve was grossly normal. TRICUSPID VALVE: - The tricuspid valve was grossly normal. RIGHT ATRIUM: - Right atrial size was normal. PERICARDIUM: - There was no pericardial effusion.  ECG - sinus rhythm, 76 beats per minute normal EKG.    Assessment & Plan  A very pleasant 80 year old female with prior medical history of well controlled hypertension and borderline diabetes mellitus who is coming for preoperative risk assessment. Patient has no history of coronary artery disease and currently has no angina or shortness of breath. She also has no signs of congestive heart failure. She is able to achieve at least 4 METS.  Her EKG today is completely normal. She is considered a low risk for intermediate risk surgery. There is currently no contraindication from cardiac standpoint to proceed with schedule orthopedic surgery. Her blood pressure is well controlled there is no need to change her medication.  Follow up as needed.  Dorothy Spark, MD, [facility] 04/15/2014, 3:45 PM

## 2014-05-06 ENCOUNTER — Encounter (HOSPITAL_COMMUNITY): Payer: Self-pay | Admitting: Pharmacy Technician

## 2014-05-08 ENCOUNTER — Ambulatory Visit (HOSPITAL_COMMUNITY)
Admission: RE | Admit: 2014-05-08 | Discharge: 2014-05-08 | Disposition: A | Discharge status: Home / Self Care | Payer: Medicare PPO | Source: Ambulatory Visit | Attending: Orthopedic Surgery | Admitting: Orthopedic Surgery

## 2014-05-08 ENCOUNTER — Encounter (HOSPITAL_COMMUNITY)
Admission: RE | Admit: 2014-05-08 | Discharge: 2014-05-08 | Disposition: A | Discharge status: Home / Self Care | Payer: Medicare PPO | Source: Ambulatory Visit | Attending: Orthopedic Surgery | Admitting: Orthopedic Surgery

## 2014-05-08 ENCOUNTER — Encounter (HOSPITAL_COMMUNITY): Payer: Self-pay

## 2014-05-08 DIAGNOSIS — R0602 Shortness of breath: Secondary | ICD-10-CM | POA: Insufficient documentation

## 2014-05-08 LAB — CBC WITH DIFFERENTIAL/PLATELET
BASOS ABS: 0.1 10*3/uL (ref 0.0–0.1)
Basophils Relative: 1 % (ref 0–1)
Eosinophils Absolute: 0.2 10*3/uL (ref 0.0–0.7)
Eosinophils Relative: 2 % (ref 0–5)
HCT: 37 % (ref 36.0–46.0)
Hemoglobin: 11.8 g/dL — ABNORMAL LOW (ref 12.0–15.0)
LYMPHS ABS: 3.1 10*3/uL (ref 0.7–4.0)
LYMPHS PCT: 38 % (ref 12–46)
MCH: 28.5 pg (ref 26.0–34.0)
MCHC: 31.9 g/dL (ref 30.0–36.0)
MCV: 89.4 fL (ref 78.0–100.0)
Monocytes Absolute: 0.5 10*3/uL (ref 0.1–1.0)
Monocytes Relative: 6 % (ref 3–12)
NEUTROS PCT: 53 % (ref 43–77)
Neutro Abs: 4.3 10*3/uL (ref 1.7–7.7)
PLATELETS: 351 10*3/uL (ref 150–400)
RBC: 4.14 MIL/uL (ref 3.87–5.11)
RDW: 13.7 % (ref 11.5–15.5)
WBC: 8.2 10*3/uL (ref 4.0–10.5)

## 2014-05-08 LAB — PROTIME-INR
INR: 1.09 (ref 0.00–1.49)
PROTHROMBIN TIME: 14.1 s (ref 11.6–15.2)

## 2014-05-08 LAB — URINALYSIS, ROUTINE W REFLEX MICROSCOPIC
BILIRUBIN URINE: NEGATIVE
Glucose, UA: NEGATIVE mg/dL
KETONES UR: NEGATIVE mg/dL
NITRITE: NEGATIVE
Protein, ur: NEGATIVE mg/dL
SPECIFIC GRAVITY, URINE: 1.012 (ref 1.005–1.030)
Urobilinogen, UA: 0.2 mg/dL (ref 0.0–1.0)
pH: 5.5 (ref 5.0–8.0)

## 2014-05-08 LAB — BASIC METABOLIC PANEL
ANION GAP: 10 (ref 5–15)
BUN: 29 mg/dL — ABNORMAL HIGH (ref 6–23)
CO2: 28 mEq/L (ref 19–32)
Calcium: 9.2 mg/dL (ref 8.4–10.5)
Chloride: 101 mEq/L (ref 96–112)
Creatinine, Ser: 1.79 mg/dL — ABNORMAL HIGH (ref 0.50–1.10)
GFR, EST AFRICAN AMERICAN: 30 mL/min — AB (ref >90)
GFR, EST NON AFRICAN AMERICAN: 26 mL/min — AB (ref >90)
Glucose, Bld: 141 mg/dL — ABNORMAL HIGH (ref 70–99)
POTASSIUM: 5.1 meq/L (ref 3.7–5.3)
Sodium: 139 mEq/L (ref 137–147)

## 2014-05-08 LAB — SURGICAL PCR SCREEN
MRSA, PCR: NEGATIVE
STAPHYLOCOCCUS AUREUS: NEGATIVE

## 2014-05-08 LAB — URINE MICROSCOPIC-ADD ON

## 2014-05-08 LAB — APTT: APTT: 26 s (ref 24–37)

## 2014-05-08 NOTE — Pre-Procedure Instructions (Signed)
Jill [name]  05/08/2014   Your procedure is scheduled on:  Monday, September 21.  Report to [facility] Admitting at 8:10 AM.  Call this number if you have problems the morning of surgery: [phone]   Remember:   Do not eat food or drink liquids after midnight Sunday, September 20.   Take these medicines the morning of surgery with A SIP OF WATER: citalopram (CELEXA).              Do not take metFORMIN (GLUCOPHAGE) the morning of surgery.                 Stop taking Aspirin and Vitamin B 12.                    Do not wear jewelry, make-up or nail polish.  Do not wear lotions, powders, or perfumes.   Do not shave 48 hours prior to surgery.  Do not bring valuables to the hospital.              [facility] is not responsible for any belongings or valuables.               Contacts, dentures or bridgework may not be worn into surgery.  Leave suitcase in the car. After surgery it may be brought to your room.  For patients admitted to the hospital, discharge time is determined by your  treatment team.                Special Instructions: Review  [place] - Preparing For Surgery.   Please read over the following fact sheets that you were given: Pain Booklet, Coughing and Deep Breathing, Blood Transfusion, Surgical Site Infection and Incective Spirometry.

## 2014-05-08 NOTE — Pre-Procedure Instructions (Signed)
Jill [name]  05/08/2014   Your procedure is scheduled on:  04/11/14  Report to [facility] Admitting at 815 AM.  Call this number if you have problems the morning of surgery: [phone]   Remember:   Do not eat food or drink liquids after midnight.   Take these medicines the morning of surgery with A SIP OF WATER: celexa   Do not wear jewelry, make-up or nail polish.  Do not wear lotions, powders, or perfumes. You may wear deodorant.  Do not shave 48 hours prior to surgery. Men may shave face and neck.  Do not bring valuables to the hospital.  [facility] is not responsible                  for any belongings or valuables.               Contacts, dentures or bridgework may not be worn into surgery.  Leave suitcase in the car. After surgery it may be brought to your room.  For patients admitted to the hospital, discharge time is determined by your                treatment team.               Patients discharged the day of surgery will not be allowed to drive  home.  Name and phone number of your driver: family  Special Instructions: Shower using CHG 2 nights before surgery and the night before surgery.  If you shower the day of surgery use CHG.  Use special wash - you have one bottle of CHG for all showers.  You should use approximately 1/3 of the bottle for each shower.   Please read over the following fact sheets that you were given: Pain Booklet, Coughing and Deep Breathing, Blood Transfusion Information, MRSA Information and Surgical Site Infection Prevention

## 2014-05-08 NOTE — H&P (Signed)
TOTAL HIP ADMISSION H&P  Patient is admitted for left total hip arthroplasty.  Subjective:  Chief Complaint: left hip pain  HPI: Jill [name], 80 y.o. female, has a history of pain and functional disability in the left hip(s) due to arthritis and patient has failed non-surgical conservative treatments for greater than 12 weeks to include NSAID's and/or analgesics, flexibility and strengthening excercises, supervised PT with diminished ADL's post treatment, use of assistive devices and activity modification.  Onset of symptoms was gradual starting several years ago with gradually worsening course since that time.The patient noted no past surgery on the left hip(s).  Patient currently rates pain in the left hip at 10 out of 10 with activity. Patient has night pain, worsening of pain with activity and weight bearing, pain that interfers with activities of daily living and pain with passive range of motion. Patient has evidence of joint space narrowing and flattening of the femoral head by imaging studies. This condition presents safety issues increasing the risk of falls.  There is no current active infection.  Patient Active Problem List   Diagnosis Date Noted  . Preoperative evaluation of a medical condition to rule out surgical contraindications (TAR required) 04/15/2014  . DNR (do not resuscitate) 02/20/2014  . Trochanteric bursitis of left hip 02/20/2014  . ALLERGIC RHINITIS 12/17/2008  . SYNCOPE 10/27/2008  . CHEST PAIN 10/27/2008  . VITAMIN D DEFICIENCY 07/01/2008  . CHRONIC KIDNEY DISEASE STAGE III (MODERATE) 02/11/2008  . DIVERTICULOSIS OF COLON 02/01/2008  . COLONIC POLYPS, ADENOMATOUS 10/19/2006  . DIABETES MELLITUS II, UNCOMPLICATED [id number]  . HYPERLIPIDEMIA 10/19/2006  . OBESITY, NOS 10/19/2006  . DEPRESSION, MAJOR, RECURRENT 10/19/2006  . HYPERTENSION, BENIGN SYSTEMIC 10/19/2006  . Recurrent Urticaria 10/19/2006   Past Medical History  Diagnosis Date  . Diabetes  mellitus   . Hyperlipidemia   . Chronic kidney disease   . Depression   . Allergy   . Hypertension   . Vasovagal syncope 2006    Negative cardiac workup-myoview, echo  . Diverticula, colon     Past Surgical History  Procedure Laterality Date  . Abdominal hysterectomy  1977    fibroid  . Joint replacement  2009    rt hip    No prescriptions prior to admission   Allergies  Allergen Reactions  . Lovastatin     REACTION: leg pain  . Niacin   . Statins     REACTION: leg cramps, weakness  . Codeine Rash    History  Substance Use Topics  . Smoking status: Former Smoker -- 1.00 packs/day for 45 years    Types: Cigarettes    Quit date: 06/16/1999  . Smokeless tobacco: Never Used  . Alcohol Use: No    Family History  Problem Relation Age of Onset  . COPD Brother   . Heart disease Brother   . Diabetes Brother   . Lymphoma Brother   . Alzheimer's disease Brother   . Heart disease Mother   . Pancreatic cancer Sister   . Alzheimer's disease Sister   . Breast cancer Sister   . Leukemia Other      Review of Systems  Constitutional:       Fainting spells  HENT: Negative.   Eyes: Negative.   Respiratory: Positive for shortness of breath.   Cardiovascular: Negative.   Gastrointestinal: Negative.   Genitourinary: Positive for frequency.  Musculoskeletal: Positive for joint pain.  Skin: Negative.   Neurological:       Poor balance  Endo/Heme/Allergies:  Bruises/bleeds easily.  Psychiatric/Behavioral: Positive for depression.    Objective:  Physical Exam  Constitutional: She is oriented to person, place, and time. She appears well-developed and well-nourished.  HENT:  Head: Normocephalic and atraumatic.  Eyes: Pupils are equal, round, and reactive to light.  Neck: Normal range of motion. Neck supple.  Cardiovascular: Intact distal pulses.   Respiratory: Effort normal.  Musculoskeletal: She exhibits tenderness.  The ASR hip internally next try rotates 40 each  foot tap is negative surgical scar is well-healed.  The left hip internally rotates to 5 with severe pain external rotation elicits moderate pain.  Foot tap is one plus positive.  She is neurovascularly intact distally.  Neurological: She is alert and oriented to person, place, and time.  Skin: Skin is warm and dry.  Psychiatric: She has a normal mood and affect. Her behavior is normal. Judgment and thought content normal.    Vital signs in last 24 hours:    Labs:   Estimated body mass index is 32.09 kg/(m^2) as calculated from the following:   Height as of 02/20/14: 5\' 2"  (1.575 m).   Weight as of 02/20/14: 79.606 kg (175 lb 8 oz).   Imaging Review Plain radiographs demonstrate AP pelvis and crosstable lateral of the hips shows well-placed well fixed DePuy ASR on S-ROM hip on the right on the left she has bone-on-bone arthritis with early flattening of the femoral head from bony erosion.  Assessment/Plan:  End stage arthritis, left hip(s)  The patient history, physical examination, clinical judgement of the provider and imaging studies are consistent with end stage degenerative joint disease of the left hip(s) and total hip arthroplasty is deemed medically necessary. The treatment options including medical management, injection therapy, arthroscopy and arthroplasty were discussed at length. The risks and benefits of total hip arthroplasty were presented and reviewed. The risks due to aseptic loosening, infection, stiffness, dislocation/subluxation,  thromboembolic complications and other imponderables were discussed.  The patient acknowledged the explanation, agreed to proceed with the plan and consent was signed. Patient is being admitted for inpatient treatment for surgery, pain control, PT, OT, prophylactic antibiotics, VTE prophylaxis, progressive ambulation and ADL's and discharge planning.The patient is planning to be discharged to skilled nursing facility

## 2014-05-09 LAB — TYPE AND SCREEN
ABO/RH(D): B POS
Antibody Screen: NEGATIVE

## 2014-05-11 DIAGNOSIS — M1612 Unilateral primary osteoarthritis, left hip: Secondary | ICD-10-CM | POA: Diagnosis present

## 2014-05-11 MED ORDER — CEFAZOLIN SODIUM-DEXTROSE 2-3 GM-% IV SOLR
2.0000 g | INTRAVENOUS | Status: AC
Start: 2014-05-12 — End: 2014-05-12
  Administered 2014-05-12: 2 g via INTRAVENOUS
  Filled 2014-05-11: qty 50

## 2014-05-12 ENCOUNTER — Encounter (HOSPITAL_COMMUNITY): Payer: Medicare PPO | Admitting: Anesthesiology

## 2014-05-12 ENCOUNTER — Inpatient Hospital Stay (HOSPITAL_COMMUNITY): Payer: Medicare PPO

## 2014-05-12 ENCOUNTER — Encounter (HOSPITAL_COMMUNITY)
Admission: RE | Disposition: A | Discharge status: Home Health | Payer: Self-pay | Source: Ambulatory Visit | Attending: Orthopedic Surgery

## 2014-05-12 ENCOUNTER — Inpatient Hospital Stay (HOSPITAL_COMMUNITY): Payer: Medicare PPO | Admitting: Anesthesiology

## 2014-05-12 ENCOUNTER — Encounter (HOSPITAL_COMMUNITY): Payer: Self-pay | Admitting: Anesthesiology

## 2014-05-12 ENCOUNTER — Inpatient Hospital Stay (HOSPITAL_COMMUNITY)
Admission: RE | Admit: 2014-05-12 | Discharge: 2014-05-15 | DRG: 470 | Disposition: A | Discharge status: Home Health | Payer: Medicare PPO | Source: Ambulatory Visit | Attending: Orthopedic Surgery | Admitting: Orthopedic Surgery

## 2014-05-12 DIAGNOSIS — Z860100 Personal history of colon polyps, unspecified: Secondary | ICD-10-CM

## 2014-05-12 DIAGNOSIS — K5730 Diverticulosis of large intestine without perforation or abscess without bleeding: Secondary | ICD-10-CM | POA: Diagnosis present

## 2014-05-12 DIAGNOSIS — Z836 Family history of other diseases of the respiratory system: Secondary | ICD-10-CM

## 2014-05-12 DIAGNOSIS — Z7982 Long term (current) use of aspirin: Secondary | ICD-10-CM

## 2014-05-12 DIAGNOSIS — Z6832 Body mass index (BMI) 32.0-32.9, adult: Secondary | ICD-10-CM

## 2014-05-12 DIAGNOSIS — G8918 Other acute postprocedural pain: Secondary | ICD-10-CM | POA: Diagnosis not present

## 2014-05-12 DIAGNOSIS — Z8249 Family history of ischemic heart disease and other diseases of the circulatory system: Secondary | ICD-10-CM | POA: Diagnosis not present

## 2014-05-12 DIAGNOSIS — Z820 Family history of epilepsy and other diseases of the nervous system: Secondary | ICD-10-CM

## 2014-05-12 DIAGNOSIS — Z888 Allergy status to other drugs, medicaments and biological substances status: Secondary | ICD-10-CM

## 2014-05-12 DIAGNOSIS — F339 Major depressive disorder, recurrent, unspecified: Secondary | ICD-10-CM | POA: Diagnosis present

## 2014-05-12 DIAGNOSIS — E785 Hyperlipidemia, unspecified: Secondary | ICD-10-CM | POA: Diagnosis present

## 2014-05-12 DIAGNOSIS — M25559 Pain in unspecified hip: Secondary | ICD-10-CM | POA: Diagnosis present

## 2014-05-12 DIAGNOSIS — E119 Type 2 diabetes mellitus without complications: Secondary | ICD-10-CM | POA: Diagnosis present

## 2014-05-12 DIAGNOSIS — N183 Chronic kidney disease, stage 3 unspecified: Secondary | ICD-10-CM | POA: Diagnosis present

## 2014-05-12 DIAGNOSIS — Z833 Family history of diabetes mellitus: Secondary | ICD-10-CM

## 2014-05-12 DIAGNOSIS — Z800 Family history of malignant neoplasm of digestive organs: Secondary | ICD-10-CM | POA: Diagnosis not present

## 2014-05-12 DIAGNOSIS — Z87891 Personal history of nicotine dependence: Secondary | ICD-10-CM | POA: Diagnosis not present

## 2014-05-12 DIAGNOSIS — M1612 Unilateral primary osteoarthritis, left hip: Secondary | ICD-10-CM

## 2014-05-12 DIAGNOSIS — E669 Obesity, unspecified: Secondary | ICD-10-CM | POA: Diagnosis not present

## 2014-05-12 DIAGNOSIS — M1610 Unilateral primary osteoarthritis, unspecified hip: Secondary | ICD-10-CM | POA: Diagnosis not present

## 2014-05-12 DIAGNOSIS — Z96649 Presence of unspecified artificial hip joint: Secondary | ICD-10-CM

## 2014-05-12 DIAGNOSIS — D62 Acute posthemorrhagic anemia: Secondary | ICD-10-CM | POA: Diagnosis not present

## 2014-05-12 DIAGNOSIS — Z66 Do not resuscitate: Secondary | ICD-10-CM | POA: Diagnosis present

## 2014-05-12 DIAGNOSIS — Z803 Family history of malignant neoplasm of breast: Secondary | ICD-10-CM | POA: Diagnosis not present

## 2014-05-12 DIAGNOSIS — Z79899 Other long term (current) drug therapy: Secondary | ICD-10-CM | POA: Diagnosis not present

## 2014-05-12 DIAGNOSIS — M169 Osteoarthritis of hip, unspecified: Secondary | ICD-10-CM | POA: Diagnosis present

## 2014-05-12 DIAGNOSIS — Z807 Family history of other malignant neoplasms of lymphoid, hematopoietic and related tissues: Secondary | ICD-10-CM

## 2014-05-12 DIAGNOSIS — Z806 Family history of leukemia: Secondary | ICD-10-CM

## 2014-05-12 HISTORY — PX: TOTAL HIP ARTHROPLASTY: SHX124

## 2014-05-12 HISTORY — DX: Other complications of anesthesia, initial encounter: T88.59XA

## 2014-05-12 HISTORY — DX: Unspecified osteoarthritis, unspecified site: M19.90

## 2014-05-12 HISTORY — DX: Family history of other specified conditions: Z84.89

## 2014-05-12 HISTORY — DX: Adverse effect of unspecified anesthetic, initial encounter: T41.45XA

## 2014-05-12 LAB — GLUCOSE, CAPILLARY
GLUCOSE-CAPILLARY: 128 mg/dL — AB (ref 70–99)
GLUCOSE-CAPILLARY: 174 mg/dL — AB (ref 70–99)
Glucose-Capillary: 89 mg/dL (ref 70–99)
Glucose-Capillary: 103 mg/dL — ABNORMAL HIGH (ref 70–99)

## 2014-05-12 SURGERY — ARTHROPLASTY, HIP, TOTAL,POSTERIOR APPROACH
Anesthesia: General | Laterality: Left

## 2014-05-12 MED ORDER — TRANEXAMIC ACID 100 MG/ML IV SOLN: 1000.0000 mg | INTRAVENOUS | Status: DC

## 2014-05-12 MED ORDER — DIPHENHYDRAMINE HCL 12.5 MG/5ML PO ELIX
12.5000 mg | ORAL_SOLUTION | ORAL | Status: DC | PRN
  Administered 2014-05-12: 25 mg via ORAL
  Filled 2014-05-12: qty 10

## 2014-05-12 MED ORDER — HYDROMORPHONE HCL 1 MG/ML IJ SOLN
INTRAMUSCULAR | Status: AC
  Filled 2014-05-12: qty 1

## 2014-05-12 MED ORDER — FLEET ENEMA 7-19 GM/118ML RE ENEM: 1.0000 | ENEMA | Freq: Once | RECTAL | Status: AC | PRN

## 2014-05-12 MED ORDER — METOCLOPRAMIDE HCL 10 MG PO TABS: 5.0000 mg | ORAL_TABLET | Freq: Three times a day (TID) | ORAL | Status: DC | PRN

## 2014-05-12 MED ORDER — ACETAMINOPHEN 325 MG PO TABS
650.0000 mg | ORAL_TABLET | Freq: Four times a day (QID) | ORAL | Status: DC | PRN
  Filled 2014-05-12: qty 2

## 2014-05-12 MED ORDER — TRAMADOL HCL 50 MG PO TABS
50.0000 mg | ORAL_TABLET | Freq: Two times a day (BID) | ORAL | Status: DC
  Administered 2014-05-13 – 2014-05-15 (×6): 50 mg via ORAL
  Filled 2014-05-12 (×6): qty 1

## 2014-05-12 MED ORDER — EPHEDRINE SULFATE 50 MG/ML IJ SOLN
INTRAMUSCULAR | Status: DC | PRN
  Administered 2014-05-12: 5 mg via INTRAVENOUS
  Administered 2014-05-12 (×2): 10 mg via INTRAVENOUS

## 2014-05-12 MED ORDER — CITALOPRAM HYDROBROMIDE 40 MG PO TABS
40.0000 mg | ORAL_TABLET | Freq: Every day | ORAL | Status: DC
Start: 2014-05-12 — End: 2014-05-15
  Administered 2014-05-13 – 2014-05-14 (×2): 40 mg via ORAL
  Filled 2014-05-12 (×4): qty 1

## 2014-05-12 MED ORDER — BUPIVACAINE-EPINEPHRINE (PF) 0.5% -1:200000 IJ SOLN
INTRAMUSCULAR | Status: AC
  Filled 2014-05-12: qty 30

## 2014-05-12 MED ORDER — MENTHOL 3 MG MT LOZG: 1.0000 | LOZENGE | OROMUCOSAL | Status: DC | PRN

## 2014-05-12 MED ORDER — BISACODYL 5 MG PO TBEC: 5.0000 mg | DELAYED_RELEASE_TABLET | Freq: Every day | ORAL | Status: DC | PRN

## 2014-05-12 MED ORDER — ALUMINUM HYDROXIDE GEL 320 MG/5ML PO SUSP
15.0000 mL | ORAL | Status: DC | PRN
  Filled 2014-05-12: qty 30

## 2014-05-12 MED ORDER — OXYCODONE HCL 5 MG PO TABS
5.0000 mg | ORAL_TABLET | ORAL | Status: DC | PRN
  Administered 2014-05-13 – 2014-05-14 (×6): 10 mg via ORAL
  Administered 2014-05-14: 5 mg via ORAL
  Administered 2014-05-15 (×2): 10 mg via ORAL
  Filled 2014-05-12: qty 1
  Filled 2014-05-12 (×9): qty 2

## 2014-05-12 MED ORDER — CEFUROXIME SODIUM 1.5 G IJ SOLR
INTRAMUSCULAR | Status: DC | PRN
  Administered 2014-05-12: 1.5 g

## 2014-05-12 MED ORDER — METOCLOPRAMIDE HCL 5 MG/ML IJ SOLN
5.0000 mg | Freq: Three times a day (TID) | INTRAMUSCULAR | Status: DC | PRN
  Administered 2014-05-12: 10 mg via INTRAVENOUS

## 2014-05-12 MED ORDER — DOCUSATE SODIUM 100 MG PO CAPS
100.0000 mg | ORAL_CAPSULE | Freq: Two times a day (BID) | ORAL | Status: DC
  Administered 2014-05-13 – 2014-05-15 (×5): 100 mg via ORAL
  Filled 2014-05-12 (×7): qty 1

## 2014-05-12 MED ORDER — METFORMIN HCL 500 MG PO TABS
500.0000 mg | ORAL_TABLET | Freq: Every day | ORAL | Status: DC
  Administered 2014-05-13: 500 mg via ORAL

## 2014-05-12 MED ORDER — METHOCARBAMOL 1000 MG/10ML IJ SOLN
500.0000 mg | Freq: Four times a day (QID) | INTRAVENOUS | Status: DC | PRN
  Filled 2014-05-12: qty 5

## 2014-05-12 MED ORDER — MIDAZOLAM HCL 2 MG/2ML IJ SOLN
INTRAMUSCULAR | Status: AC
  Filled 2014-05-12: qty 2

## 2014-05-12 MED ORDER — GLYCOPYRROLATE 0.2 MG/ML IJ SOLN
INTRAMUSCULAR | Status: DC | PRN
  Administered 2014-05-12: 0.4 mg via INTRAVENOUS

## 2014-05-12 MED ORDER — INSULIN ASPART 100 UNIT/ML ~~LOC~~ SOLN
0.0000 [IU] | Freq: Three times a day (TID) | SUBCUTANEOUS | Status: DC
  Administered 2014-05-13 – 2014-05-14 (×3): 3 [IU] via SUBCUTANEOUS
  Administered 2014-05-14: 2 [IU] via SUBCUTANEOUS

## 2014-05-12 MED ORDER — GLYCOPYRROLATE 0.2 MG/ML IJ SOLN
INTRAMUSCULAR | Status: AC
  Filled 2014-05-12: qty 2

## 2014-05-12 MED ORDER — NEOSTIGMINE METHYLSULFATE 10 MG/10ML IV SOLN
INTRAVENOUS | Status: DC | PRN
  Administered 2014-05-12: 3 mg via INTRAVENOUS

## 2014-05-12 MED ORDER — PROPOFOL 10 MG/ML IV BOLUS
INTRAVENOUS | Status: DC | PRN
  Administered 2014-05-12 (×3): 50 mg via INTRAVENOUS
  Administered 2014-05-12: 150 mg via INTRAVENOUS

## 2014-05-12 MED ORDER — BUPIVACAINE-EPINEPHRINE 0.5% -1:200000 IJ SOLN
INTRAMUSCULAR | Status: DC | PRN
  Administered 2014-05-12: 20 mL

## 2014-05-12 MED ORDER — LIDOCAINE HCL (CARDIAC) 20 MG/ML IV SOLN
INTRAVENOUS | Status: DC | PRN
  Administered 2014-05-12: 60 mg via INTRAVENOUS

## 2014-05-12 MED ORDER — FENTANYL CITRATE 0.05 MG/ML IJ SOLN
INTRAMUSCULAR | Status: AC
  Filled 2014-05-12: qty 5

## 2014-05-12 MED ORDER — ASPIRIN EC 325 MG PO TBEC: 325.0000 mg | DELAYED_RELEASE_TABLET | Freq: Two times a day (BID) | ORAL | Status: DC

## 2014-05-12 MED ORDER — SODIUM CHLORIDE 0.9 % IJ SOLN
INTRAMUSCULAR | Status: AC
  Filled 2014-05-12: qty 20

## 2014-05-12 MED ORDER — LIDOCAINE HCL (CARDIAC) 20 MG/ML IV SOLN
INTRAVENOUS | Status: AC
  Filled 2014-05-12: qty 5

## 2014-05-12 MED ORDER — ONDANSETRON HCL 4 MG/2ML IJ SOLN
4.0000 mg | Freq: Four times a day (QID) | INTRAMUSCULAR | Status: DC | PRN
  Administered 2014-05-12: 4 mg via INTRAVENOUS
  Filled 2014-05-12: qty 2

## 2014-05-12 MED ORDER — KCL IN DEXTROSE-NACL 20-5-0.45 MEQ/L-%-% IV SOLN
INTRAVENOUS | Status: DC
  Administered 2014-05-12: 19:00:00 via INTRAVENOUS
  Filled 2014-05-12 (×11): qty 1000

## 2014-05-12 MED ORDER — ONDANSETRON HCL 4 MG/2ML IJ SOLN
INTRAMUSCULAR | Status: AC
  Filled 2014-05-12: qty 2

## 2014-05-12 MED ORDER — FENOFIBRATE 54 MG PO TABS
108.0000 mg | ORAL_TABLET | Freq: Every day | ORAL | Status: DC
  Administered 2014-05-13 – 2014-05-15 (×3): 108 mg via ORAL
  Filled 2014-05-12 (×4): qty 2

## 2014-05-12 MED ORDER — ONDANSETRON HCL 4 MG/2ML IJ SOLN: 4.0000 mg | Freq: Once | INTRAMUSCULAR | Status: DC | PRN

## 2014-05-12 MED ORDER — EPHEDRINE SULFATE 50 MG/ML IJ SOLN
INTRAMUSCULAR | Status: AC
  Filled 2014-05-12: qty 1

## 2014-05-12 MED ORDER — FENTANYL CITRATE 0.05 MG/ML IJ SOLN
INTRAMUSCULAR | Status: DC | PRN
  Administered 2014-05-12: 100 ug via INTRAVENOUS
  Administered 2014-05-12 (×3): 50 ug via INTRAVENOUS
  Administered 2014-05-12: 100 ug via INTRAVENOUS

## 2014-05-12 MED ORDER — ROCURONIUM BROMIDE 50 MG/5ML IV SOLN
INTRAVENOUS | Status: AC
  Filled 2014-05-12: qty 1

## 2014-05-12 MED ORDER — PROPOFOL 10 MG/ML IV BOLUS
INTRAVENOUS | Status: AC
  Filled 2014-05-12: qty 20

## 2014-05-12 MED ORDER — PHENOL 1.4 % MT LIQD: 1.0000 | OROMUCOSAL | Status: DC | PRN

## 2014-05-12 MED ORDER — ROCURONIUM BROMIDE 100 MG/10ML IV SOLN
INTRAVENOUS | Status: DC | PRN
  Administered 2014-05-12: 30 mg via INTRAVENOUS

## 2014-05-12 MED ORDER — CEFUROXIME SODIUM 1.5 G IJ SOLR
INTRAMUSCULAR | Status: AC
  Filled 2014-05-12: qty 1.5

## 2014-05-12 MED ORDER — LABETALOL HCL 5 MG/ML IV SOLN
INTRAVENOUS | Status: AC
  Filled 2014-05-12: qty 4

## 2014-05-12 MED ORDER — OXYCODONE-ACETAMINOPHEN 5-325 MG PO TABS: 1.0000 | ORAL_TABLET | ORAL | Status: DC | PRN

## 2014-05-12 MED ORDER — SENNOSIDES-DOCUSATE SODIUM 8.6-50 MG PO TABS: 1.0000 | ORAL_TABLET | Freq: Every evening | ORAL | Status: DC | PRN

## 2014-05-12 MED ORDER — TRANEXAMIC ACID 100 MG/ML IV SOLN
1000.0000 mg | INTRAVENOUS | Status: AC
  Administered 2014-05-12: 1000 mg via INTRAVENOUS
  Filled 2014-05-12: qty 10

## 2014-05-12 MED ORDER — HYDROMORPHONE HCL 1 MG/ML IJ SOLN: 0.2500 mg | INTRAMUSCULAR | Status: DC | PRN

## 2014-05-12 MED ORDER — HYDROMORPHONE HCL 1 MG/ML IJ SOLN
INTRAMUSCULAR | Status: AC
  Administered 2014-05-12: 0.25 mg via INTRAVENOUS
  Filled 2014-05-12: qty 1

## 2014-05-12 MED ORDER — HYDROMORPHONE HCL 1 MG/ML IJ SOLN
0.2500 mg | INTRAMUSCULAR | Status: DC | PRN
  Administered 2014-05-12 (×2): 0.25 mg via INTRAVENOUS
  Administered 2014-05-12 (×2): 0.5 mg via INTRAVENOUS

## 2014-05-12 MED ORDER — LACTATED RINGERS IV SOLN
INTRAVENOUS | Status: DC
  Administered 2014-05-12: 09:00:00 via INTRAVENOUS

## 2014-05-12 MED ORDER — 0.9 % SODIUM CHLORIDE (POUR BTL) OPTIME
TOPICAL | Status: DC | PRN
  Administered 2014-05-12: 1000 mL

## 2014-05-12 MED ORDER — LACTATED RINGERS IV SOLN
INTRAVENOUS | Status: DC | PRN
  Administered 2014-05-12 (×2): via INTRAVENOUS

## 2014-05-12 MED ORDER — METHOCARBAMOL 500 MG PO TABS
500.0000 mg | ORAL_TABLET | Freq: Four times a day (QID) | ORAL | Status: DC | PRN
  Administered 2014-05-13 – 2014-05-14 (×2): 500 mg via ORAL
  Filled 2014-05-12 (×2): qty 1

## 2014-05-12 MED ORDER — ONDANSETRON HCL 4 MG PO TABS: 4.0000 mg | ORAL_TABLET | Freq: Four times a day (QID) | ORAL | Status: DC | PRN

## 2014-05-12 MED ORDER — ASPIRIN EC 325 MG PO TBEC
325.0000 mg | DELAYED_RELEASE_TABLET | Freq: Every day | ORAL | Status: DC
  Administered 2014-05-13 – 2014-05-15 (×3): 325 mg via ORAL
  Filled 2014-05-12 (×4): qty 1

## 2014-05-12 MED ORDER — HYDROMORPHONE HCL 1 MG/ML IJ SOLN
0.5000 mg | INTRAMUSCULAR | Status: DC | PRN
  Administered 2014-05-12 – 2014-05-13 (×3): 0.5 mg via INTRAVENOUS
  Filled 2014-05-12 (×3): qty 1

## 2014-05-12 MED ORDER — LABETALOL HCL 5 MG/ML IV SOLN
INTRAVENOUS | Status: DC | PRN
  Administered 2014-05-12: 5 mg via INTRAVENOUS

## 2014-05-12 MED ORDER — ACETAMINOPHEN 650 MG RE SUPP
650.0000 mg | Freq: Four times a day (QID) | RECTAL | Status: DC | PRN
Start: 2014-05-12 — End: 2014-05-15

## 2014-05-12 MED ORDER — TIZANIDINE HCL 2 MG PO CAPS: 2.0000 mg | ORAL_CAPSULE | Freq: Three times a day (TID) | ORAL | Status: DC

## 2014-05-12 SURGICAL SUPPLY — 55 items
BLADE SAW SGTL 18X1.27X75 (BLADE) ×2 IMPLANT
BLADE SAW SGTL 18X1.27X75MM (BLADE) ×1
BRUSH FEMORAL CANAL (MISCELLANEOUS) ×2 IMPLANT
CAPT HIP MOP CEMENTED ×2 IMPLANT
CEMENT BONE DEPUY (Cement) ×4 IMPLANT
CEMENT RESTRICTOR DEPUY SZ 3 (Cement) ×2 IMPLANT
COVER BACK TABLE 24X17X13 BIG (DRAPES) ×2 IMPLANT
COVER SURGICAL LIGHT HANDLE (MISCELLANEOUS) ×4 IMPLANT
DRAPE ORTHO SPLIT 77X108 STRL (DRAPES) ×3
DRAPE PROXIMA HALF (DRAPES) ×3 IMPLANT
DRAPE SURG ORHT 6 SPLT 77X108 (DRAPES) ×1 IMPLANT
DRAPE U-SHAPE 47X51 STRL (DRAPES) ×3 IMPLANT
DRILL BIT 7/64X5 (BIT) ×3 IMPLANT
DRSG AQUACEL AG ADV 3.5X10 (GAUZE/BANDAGES/DRESSINGS) ×3 IMPLANT
DURAPREP 26ML APPLICATOR (WOUND CARE) ×3 IMPLANT
ELECT BLADE 4.0 EZ CLEAN MEGAD (MISCELLANEOUS) ×3
ELECT REM PT RETURN 9FT ADLT (ELECTROSURGICAL) ×3
ELECTRODE BLDE 4.0 EZ CLN MEGD (MISCELLANEOUS) IMPLANT
ELECTRODE REM PT RTRN 9FT ADLT (ELECTROSURGICAL) ×1 IMPLANT
GAUZE XEROFORM 1X8 LF (GAUZE/BANDAGES/DRESSINGS) ×1 IMPLANT
GLOVE BIO SURGEON STRL SZ7.5 (GLOVE) ×7 IMPLANT
GLOVE BIO SURGEON STRL SZ8.5 (GLOVE) ×6 IMPLANT
GLOVE BIOGEL PI IND STRL 8 (GLOVE) ×2 IMPLANT
GLOVE BIOGEL PI IND STRL 9 (GLOVE) ×1 IMPLANT
GLOVE BIOGEL PI INDICATOR 8 (GLOVE) ×4
GLOVE BIOGEL PI INDICATOR 9 (GLOVE) ×2
GOWN STRL REUS W/ TWL LRG LVL3 (GOWN DISPOSABLE) ×2 IMPLANT
GOWN STRL REUS W/ TWL XL LVL3 (GOWN DISPOSABLE) ×3 IMPLANT
GOWN STRL REUS W/TWL LRG LVL3 (GOWN DISPOSABLE) ×6
GOWN STRL REUS W/TWL XL LVL3 (GOWN DISPOSABLE) ×6
HANDPIECE INTERPULSE COAX TIP (DISPOSABLE) ×3
HOOD PEEL AWAY FACE SHEILD DIS (HOOD) ×6 IMPLANT
KIT BASIN OR (CUSTOM PROCEDURE TRAY) ×3 IMPLANT
KIT ROOM TURNOVER OR (KITS) ×3 IMPLANT
MANIFOLD NEPTUNE II (INSTRUMENTS) ×3 IMPLANT
NEEDLE 22X1 1/2 (OR ONLY) (NEEDLE) ×3 IMPLANT
NS IRRIG 1000ML POUR BTL (IV SOLUTION) ×3 IMPLANT
PACK TOTAL JOINT (CUSTOM PROCEDURE TRAY) ×3 IMPLANT
PAD ARMBOARD 7.5X6 YLW CONV (MISCELLANEOUS) ×8 IMPLANT
PASSER SUT SWANSON 36MM LOOP (INSTRUMENTS) ×3 IMPLANT
PRESSURIZER FEMORAL UNIV (MISCELLANEOUS) ×2 IMPLANT
SET HNDPC FAN SPRY TIP SCT (DISPOSABLE) IMPLANT
SUT ETHIBOND 2 V 37 (SUTURE) ×3 IMPLANT
SUT VIC AB 0 CTB1 27 (SUTURE) ×3 IMPLANT
SUT VIC AB 1 CTX 36 (SUTURE) ×3
SUT VIC AB 1 CTX36XBRD ANBCTR (SUTURE) ×1 IMPLANT
SUT VIC AB 2-0 CTB1 (SUTURE) ×3 IMPLANT
SUT VIC AB 3-0 SH 27 (SUTURE) ×3
SUT VIC AB 3-0 SH 27X BRD (SUTURE) ×1 IMPLANT
SYR CONTROL 10ML LL (SYRINGE) ×3 IMPLANT
TOWEL OR 17X24 6PK STRL BLUE (TOWEL DISPOSABLE) ×3 IMPLANT
TOWEL OR 17X26 10 PK STRL BLUE (TOWEL DISPOSABLE) ×3 IMPLANT
TOWER CARTRIDGE SMART MIX (DISPOSABLE) ×2 IMPLANT
TRAY FOLEY CATH 14FR (SET/KITS/TRAYS/PACK) IMPLANT
WATER STERILE IRR 1000ML POUR (IV SOLUTION) ×4 IMPLANT

## 2014-05-12 NOTE — Interval H&P Note (Signed)
History and Physical Interval Note:  05/12/2014 9:34 AM  Jill [name]  has presented today for surgery, with the diagnosis of OSTEOARTHRITIS LEFT HIP  The various methods of treatment have been discussed with the patient and family. After consideration of risks, benefits and other options for treatment, the patient has consented to  Procedure(s): TOTAL HIP ARTHROPLASTY (Left) as a surgical intervention .  The patient's history has been reviewed, patient examined, no change in status, stable for surgery.  I have reviewed the patient's chart and labs.  Questions were answered to the patient's satisfaction.     Kerin Salen

## 2014-05-12 NOTE — Progress Notes (Signed)
Utilization review completed.  

## 2014-05-12 NOTE — Op Note (Signed)
PATIENT ID:      SHAUNTAY ADI  MRN:     [id number] DOB/AGE:    [birth year] / 78 y.o.       OPERATIVE REPORT    DATE OF PROCEDURE:  05/12/2014       PREOPERATIVE DIAGNOSIS:  OSTEOARTHRITIS LEFT HIP                                                       Estimated body mass index is 32.91 kg/(m^2) as calculated from the following:   Height as of this encounter: 5\' 2"  (1.575 m).   Weight as of this encounter: 81.647 kg (180 lb).     POSTOPERATIVE DIAGNOSIS:  OSTEOARTHRITIS LEFT HIP                                                           PROCEDURE:  L total hip arthroplasty using a 52 mm DePuy Pinnacle  Cup, [employer], 10-degree polyethylene liner index superior  and posterior, a +1.5 36 mm metal head, a #3 Summit basic stem, cemented double batch of DePuy HV cement with 1500 mg of Zinacef, 11 mm centralizer and #3 central canal occluder   SURGEON: Shavonna Corella J    ASSISTANT:   Eric K. Barton Dubois  (present throughout entire procedure and necessary for timely completion of the procedure)  ANESTHESIA:  General  BLOOD LOSS: * No blood loss amount entered *  FLUID REPLACEMENT: 1500 crystalloid    INDICATIONS FOR PROCEDURE:Patient with end-stage arthritis of the L hip.  X-rays show bone-on-bone arthritic changes. Despite conservative measures with observation, anti-inflammatory medicine, narcotics, use of a cane, has severe unremitting pain and can ambulate only 1 blocks before resting.  Patient desires elective right total hip arthroplasty to decrease pain and increase function. The risks, benefits, and alternatives were discussed at length including but not limited to the risks of infection, bleeding, nerve injury, stiffness, blood clots, the need for revision surgery, cardiopulmonary complications, among others, and they were willing to proceed.Benefits have been discussed. Questions answered.     PROCEDURE IN DETAIL: The patient was identified by armband,  received  preoperative IV antibiotics in the holding area at [facility], taken to the operating room , appropriate anesthetic monitors  were attached and general endotracheal anesthesia induced. Foley catheter was inserted. Patient was rolled into the R lateral decubitus position and fixed there with a Stulberg Mark II pelvic clamp and the L lower extremity was then prepped and draped  in the usual sterile fashion from the ankle to the hemipelvis. A time-out  procedure was performed. The skin along the lateral hip and thigh  infiltrated with 10 mL of 0.5% Marcaine and epinephrine solution. We  then made a posterolateral approach to the hip. With a #10 blade, 15 cm  incision through skin and subcutaneous tissue down to the level of the  IT band. Small bleeders were identified and cauterized. IT band cut in  line with skin incision exposing the greater trochanter. A Cobra retractor was placed between the gluteus minimus and the superior hip joint capsule, and a spiked Cobra between the  quadratus femoris and the inferior hip joint capsule. This isolated the short  external rotators and piriformis tendons. These were tagged with a #2 Ethibond  suture and cut off their insertion on the intertrochanteric crest. The posterior  capsule was then developed into an acetabular-based flap from Posterior Superior off of the acetabulum out over the femoral neck and back posterior inferior to the acetabular rim. This flap was tagged with two #2 Ethibond sutures and retracted protecting the sciatic nerve. This exposed the arthritic femoral head and osteophytes. The hip was then flexed and internally rotated, dislocating the femoral head and a standard neck cut performed 1 fingerbreadth above the lesser trochanter.  A spiked Cobra was placed in the cotyloid notch and a Hohmann retractor was then used to lever the femur anteriorly off of the anterior pelvic column. A posterior-inferior wing retractor was placed at the  junction of the acetabulum and the ischium completing the acetabular exposure.We then removed the peripheral osteophytes and labrum from the acetabulum. We then reamed the acetabulum up to 51 mm with basket reamers obtaining good coverage in all quadrants, irrigated out with normal  saline solution and hammered into place a 52 mm pinnacle cup in 45  degrees of abduction and about 20 degrees of anteversion. More  peripheral osteophytes removed and a trial 10-degree liner placed with the  index superior-posterior. The hip was then flexed and internally rotated exposing the  proximal femur, which was entered with the initiating reamer followed by  the tapered reamers up to a 4-5 tapered reamer and broaching up to a #3 broach, which had good fit and fill. A trial reduction was then  performed with a +0  36-mm ball on the standard neck and  excellent stability was noted with at 90 of flexion with 75 of  internal rotation and then full extension withexternal rotation. The hip  could not be dislocated in full extension. The knee could easily flex  to about 140 degrees. We also stretched the abductors at this point,  because of the preexisting adductor contractures. All trial components  were then removed. The acetabulum was irrigated out with normal saline  solution. A titanium [facility] was then screwed into place  followed by a 10-degree polyethylene liner index superior-posterior. On  the femoral side, we then sized for a #3 cement restrictor and irrigated  out the femoral canal with normal saline solution and dried with suction  and sponges. The cement restrictor was placed to the appropriate depth. A  double batch of DePuy 1 cement with 1500 mg of Zinacef was mixed and  injected into the canal under pressure followed by #3 Summit basic stem  in 20 degrees of anteversion and as this was held in place, excess cement  was removed. At this point, a +0 36-mm metal head was  hammered on  the stem. The hip was reduced. We checked our stability  one more time and found to be excellent. The wound was once again  thoroughly irrigated out with normal saline solution pulse lavage. The  capsular flap and short external rotators were repaired back to the  intertrochanteric crest through drill holes with a #2 Ethibond suture.  The IT band was closed with running 1 Vicryl suture. The subcutaneous  tissue with 0 and 2-0 undyed Vicryl suture and the skin with running  interlocking 3-0 nylon suture. Dressing of Xeroform and Mepilex was  then applied. The patient was then unclamped, rolled supine, awaken extubated and taken  to recovery room without difficulty in stable condition.   Janathan Bribiesca J 05/12/2014, 11:26 AM

## 2014-05-12 NOTE — Anesthesia Postprocedure Evaluation (Signed)
  Anesthesia Post-op Note  Patient: Jill [name]  Procedure(s) Performed: Procedure(s): TOTAL HIP ARTHROPLASTY (Left)  Patient Location: PACU  Anesthesia Type:General  Level of Consciousness: awake, oriented, sedated and patient cooperative  Airway and Oxygen Therapy: Patient Spontanous Breathing  Post-op Pain: moderate  Post-op Assessment: Post-op Vital signs reviewed, Patient's Cardiovascular Status Stable, Respiratory Function Stable, Patent Airway and No signs of Nausea or vomiting  Post-op Vital Signs: stable  Last Vitals:  Filed Vitals:   05/12/14 1200  BP: 113/52  Pulse: 75  Temp:   Resp: 10    Complications: No apparent anesthesia complications

## 2014-05-12 NOTE — OR Nursing (Signed)
05-12-2014 1213  During induction/intubation, the patient's upper lip was cut by the laryngoscope blade.  EMT Student was looking at anatomy involved in intubation.  Anesthesiologist and Nurse Anesthetist were present during occurrence.  Slight amount of bleeding noted and stopped with pressure.

## 2014-05-12 NOTE — Anesthesia Preprocedure Evaluation (Signed)
Anesthesia Evaluation  Patient identified by MRN, date of birth, ID band Patient awake    Reviewed: Allergy & Precautions, H&P , NPO status , Patient's Chart, lab work & pertinent test results  Airway       Dental   Pulmonary former smoker,          Cardiovascular hypertension,     Neuro/Psych    GI/Hepatic   Endo/Other  diabetes, Type 2, Oral Hypoglycemic Agents  Renal/GU Renal InsufficiencyRenal disease     Musculoskeletal  (+) Arthritis -,   Abdominal   Peds  Hematology   Anesthesia Other Findings   Reproductive/Obstetrics                           Anesthesia Physical Anesthesia Plan  ASA: III  Anesthesia Plan: General   Post-op Pain Management:    Induction: Intravenous  Airway Management Planned: Oral ETT  Additional Equipment:   Intra-op Plan:   Post-operative Plan: Extubation in OR  Informed Consent: I have reviewed the patients History and Physical, chart, labs and discussed the procedure including the risks, benefits and alternatives for the proposed anesthesia with the patient or authorized representative who has indicated his/her understanding and acceptance.     Plan Discussed with: CRNA, Anesthesiologist and Surgeon  Anesthesia Plan Comments:         Anesthesia Quick Evaluation

## 2014-05-12 NOTE — Plan of Care (Signed)
Problem: Consults Goal: Diagnosis- Total Joint Replacement Primary Total Hip Left     

## 2014-05-12 NOTE — Transfer of Care (Signed)
Immediate Anesthesia Transfer of Care Note  Patient: Jill [name]  Procedure(s) Performed: Procedure(s): TOTAL HIP ARTHROPLASTY (Left)  Patient Location: PACU  Anesthesia Type:General  Level of Consciousness: awake  Airway & Oxygen Therapy: Patient Spontanous Breathing and Patient connected to nasal cannula oxygen  Post-op Assessment: Report given to PACU RN and Post -op Vital signs reviewed and stable  Post vital signs: Reviewed and stable  Complications: No apparent anesthesia complications

## 2014-05-13 ENCOUNTER — Encounter (HOSPITAL_COMMUNITY): Payer: Self-pay | Admitting: General Practice

## 2014-05-13 LAB — BASIC METABOLIC PANEL
ANION GAP: 10 (ref 5–15)
BUN: 25 mg/dL — ABNORMAL HIGH (ref 6–23)
CALCIUM: 7.9 mg/dL — AB (ref 8.4–10.5)
CO2: 24 mEq/L (ref 19–32)
Chloride: 101 mEq/L (ref 96–112)
Creatinine, Ser: 1.69 mg/dL — ABNORMAL HIGH (ref 0.50–1.10)
GFR, EST AFRICAN AMERICAN: 32 mL/min — AB (ref >90)
GFR, EST NON AFRICAN AMERICAN: 27 mL/min — AB (ref >90)
GLUCOSE: 150 mg/dL — AB (ref 70–99)
POTASSIUM: 4.7 meq/L (ref 3.7–5.3)
SODIUM: 135 meq/L — AB (ref 137–147)

## 2014-05-13 LAB — HEMOGLOBIN A1C
Hgb A1c MFr Bld: 7.7 % — ABNORMAL HIGH (ref <5.7)
Mean Plasma Glucose: 174 mg/dL — ABNORMAL HIGH (ref <117)

## 2014-05-13 LAB — CBC
HCT: 28.4 % — ABNORMAL LOW (ref 36.0–46.0)
HEMOGLOBIN: 9.2 g/dL — AB (ref 12.0–15.0)
MCH: 28.7 pg (ref 26.0–34.0)
MCHC: 32.4 g/dL (ref 30.0–36.0)
MCV: 88.5 fL (ref 78.0–100.0)
Platelets: 265 10*3/uL (ref 150–400)
RBC: 3.21 MIL/uL — ABNORMAL LOW (ref 3.87–5.11)
RDW: 13.9 % (ref 11.5–15.5)
WBC: 10.8 10*3/uL — ABNORMAL HIGH (ref 4.0–10.5)

## 2014-05-13 LAB — GLUCOSE, CAPILLARY
GLUCOSE-CAPILLARY: 162 mg/dL — AB (ref 70–99)
GLUCOSE-CAPILLARY: 132 mg/dL — AB (ref 70–99)
Glucose-Capillary: 156 mg/dL — ABNORMAL HIGH (ref 70–99)
Glucose-Capillary: 152 mg/dL — ABNORMAL HIGH (ref 70–99)

## 2014-05-13 MED ORDER — INFLUENZA VAC SPLIT QUAD 0.5 ML IM SUSY
0.5000 mL | PREFILLED_SYRINGE | INTRAMUSCULAR | Status: AC
  Administered 2014-05-14: 0.5 mL via INTRAMUSCULAR
  Filled 2014-05-13: qty 0.5

## 2014-05-13 NOTE — Progress Notes (Signed)
PT Cancellation Note  Patient Details Name: AIJALON GREENHILL MRN: [id number] DOB: Jun 17, 1932   Cancelled Treatment:    Reason Eval/Treat Not Completed: Other (comment) (RN asks to hold therapy) Per RN, pt was unable to sleep last night and is in considerable pain this AM. Requests PT evaluation be held until a later time. Will follow up as time allows.  [place], [place]   Ellouise Newer 05/13/2014, 9:23 AM

## 2014-05-13 NOTE — Evaluation (Signed)
Physical Therapy Evaluation Patient Details Name: Jill [name] MRN: [id number] DOB: 07-17-1932 Today's Date: 05/13/2014   History of Present Illness  80 y.o. female s/p left total hip arthroplasty. Hx of syncope, HTN, chest pain, DM and chronic kidney disease.  Clinical Impression  Pt is s/p left THA resulting in the deficits listed below (see PT Problem List). Ambulatory distance limited by rapid fatigue. Tolerated therapeutic exercises well and participated with transfer training from recliner. Pt required min guard to min assist today with mobility training. Pt will benefit from skilled PT to increase their independence and safety with mobility to allow discharge to the venue listed below.       Follow Up Recommendations Home health PT;Supervision for mobility/OOB    Equipment Recommendations  None recommended by PT    Recommendations for Other Services OT consult     Precautions / Restrictions Precautions Precautions: Posterior Hip Precaution Booklet Issued: Yes (comment) Precaution Comments: Reviewed posterior hip precautions Restrictions Weight Bearing Restrictions: Yes LLE Weight Bearing: Weight bearing as tolerated      Mobility  Bed Mobility                  Transfers Overall transfer level: Needs assistance Equipment used: Rolling walker (2 wheeled) Transfers: Sit to/from Stand Sit to Stand: Min assist         General transfer comment: Min assist for boost to stand from reclining chair. VC for hand placement. Poor control with descent into chair; unsafely rushes due to fatigue. Requires assist for guidance into chair.  Ambulation/Gait Ambulation/Gait assistance: Min guard Ambulation Distance (Feet): 8 Feet Assistive device: Rolling walker (2 wheeled) Gait Pattern/deviations: Step-to pattern;Decreased step length - right;Decreased stance time - left;Antalgic   Gait velocity interpretation: Below normal speed for age/gender General Gait Details:  Educated on safe DME use with VC for sequencing and walker placement. Close guard for safety. Fatigues rapidly and was unable to ambulate further distance.   Stairs            Wheelchair Mobility    Modified Rankin (Stroke Patients Only)       Balance Overall balance assessment: Needs assistance Sitting-balance support: No upper extremity supported;Feet supported Sitting balance-Leahy Scale: Fair     Standing balance support: Bilateral upper extremity supported Standing balance-Leahy Scale: Poor                               Pertinent Vitals/Pain Pain Assessment: 0-10 Pain Score: 0-No pain Pain Intervention(s): Monitored during session    Home Living Family/patient expects to be discharged to:: Private residence Living Arrangements: Children ("daughter-in-law" and son) Available Help at Discharge: Family;Available 24 hours/day Type of Home: House Home Access: Ramped entrance     Home Layout: One level Home Equipment: Walker - 2 wheels;Bedside commode;Shower seat;Other (comment) (3 in 1)      Prior Function Level of Independence: Independent               Hand Dominance   Dominant Hand: Right    Extremity/Trunk Assessment   Upper Extremity Assessment: Defer to OT evaluation           Lower Extremity Assessment: LLE deficits/detail   LLE Deficits / Details: decreased strength and ROM as expected post op     Communication   Communication: No difficulties  Cognition Arousal/Alertness: Lethargic;Suspect due to medications Behavior During Therapy: [facility] for tasks assessed/performed Overall Cognitive Status: Within Functional Limits for  tasks assessed                      General Comments      Exercises Total Joint Exercises Ankle Circles/Pumps: AROM;Both;10 reps;Seated Quad Sets: Strengthening;Both;10 reps;Seated Gluteal Sets: Strengthening;Both;5 reps;Seated      Assessment/Plan    PT Assessment Patient needs  continued PT services  PT Diagnosis Difficulty walking;Abnormality of gait;Acute pain   PT Problem List Decreased strength;Decreased range of motion;Decreased activity tolerance;Decreased balance;Decreased mobility;Decreased knowledge of use of DME;Decreased knowledge of precautions;Pain  PT Treatment Interventions DME instruction;Gait training;Functional mobility training;Therapeutic activities;Therapeutic exercise;Neuromuscular re-education;Balance training;Patient/family education;Modalities   PT Goals (Current goals can be found in the Care Plan section) Acute Rehab PT Goals Patient Stated Goal: Walk 2 miles PT Goal Formulation: With patient Time For Goal Achievement: 05/20/14 Potential to Achieve Goals: Good    Frequency 7X/week   Barriers to discharge        Co-evaluation               End of Session   Activity Tolerance: Patient limited by fatigue Patient left: in chair;with call bell/phone within reach;with family/visitor present Nurse Communication: Mobility status         Time: [id number] PT Time Calculation (min): 28 min   Charges:   PT Evaluation $Initial PT Evaluation Tier I: 1 Procedure PT Treatments $Therapeutic Activity: 8-22 mins   PT G Codes:         Elayne Snare, [place]   Ellouise Newer 05/13/2014, 1:15 PM

## 2014-05-13 NOTE — Progress Notes (Signed)
PHARMACIST - PHYSICIAN COMMUNICATION DR:  Mayer Camel CONCERNING:  METFORMIN SAFE ADMINISTRATION POLICY  RECOMMENDATION: Metformin has been placed on DISCONTINUE (rejected order) STATUS and should be reordered only after any of the conditions below are ruled out.  Current safety recommendations include avoiding metformin for a minimum of 48 hours after the patient's exposure to intravenous contrast media.  DESCRIPTION:  The Pharmacy Committee has adopted a policy that restricts the use of metformin in hospitalized patients until all the contraindications to administration have been ruled out. Specific contraindications are: []  Serum creatinine ? 1.5 for males [x]  Serum creatinine ? 1.4 for females []  Shock, acute MI, sepsis, hypoxemia, dehydration []  Planned administration of intravenous iodinated contrast media []  Heart Failure patients with low EF []  Acute or chronic metabolic acidosis (including DKA)     Salome Arnt, PharmD, BCPS Pager # [phone] 05/13/2014 11:29 AM

## 2014-05-13 NOTE — Progress Notes (Signed)
OT Cancellation Note  Patient Details Name: BELISA KASSAM MRN: [id number] DOB: [birth year]   Cancelled Treatment:    Reason Eval/Treat Not Completed: Pain limiting ability to participate;Fatigue/lethargy limiting ability to participate;Other (comment) Per RN pt is in pain and fatigued from lack of sleep. Request OT eval be held until later. Will reattempt OT eval.  Hortencia Pilar 05/13/2014, 9:42 AM

## 2014-05-13 NOTE — Progress Notes (Signed)
Physical Therapy Treatment Patient Details Name: Jill [name] MRN: [id number] DOB: [birth year] Today's Date: 05/13/2014    History of Present Illness 80 y.o. female s/p left total hip arthroplasty. Hx of syncope, HTN, chest pain, DM and chronic kidney disease.    PT Comments    Patient progressing slowly this afternoon. Patient appears very fatigued and tired. Will continue with POC tomorrow and work on progressive ambulation.   Follow Up Recommendations  Home health PT;Supervision for mobility/OOB     Equipment Recommendations  None recommended by PT    Recommendations for Other Services OT consult     Precautions / Restrictions Precautions Precautions: Posterior Hip Precaution Booklet Issued: Yes (comment) Precaution Comments: Reviewed posterior hip precautions Restrictions Weight Bearing Restrictions: Yes LLE Weight Bearing: Weight bearing as tolerated    Mobility  Bed Mobility               General bed mobility comments: Patient up in recliner before and after session  Transfers Overall transfer level: Needs assistance Equipment used: Rolling walker (2 wheeled) Transfers: Sit to/from Stand Sit to Stand: Mod assist         General transfer comment: Mod A from surface without armrest. Cues for hand placement and correct technique  Ambulation/Gait Ambulation/Gait assistance: Min guard Ambulation Distance (Feet): 15 Feet Assistive device: Rolling walker (2 wheeled) Gait Pattern/deviations: Decreased step length - right;Decreased stance time - left;Step-through pattern   Gait velocity interpretation: Below normal speed for age/gender General Gait Details: Educated on safe DME use with VC for sequencing and walker placement. Continues to fatigue quickly and requred seated rest break   Stairs            Wheelchair Mobility    Modified Rankin (Stroke Patients Only)       Balance Overall balance assessment: Needs assistance Sitting-balance  support: No upper extremity supported;Feet supported Sitting balance-Leahy Scale: Fair     Standing balance support: Bilateral upper extremity supported Standing balance-Leahy Scale: Poor                      Cognition Arousal/Alertness: Awake/alert Behavior During Therapy: WFL for tasks assessed/performed Overall Cognitive Status: Within Functional Limits for tasks assessed                      Exercises Total Joint Exercises Ankle Circles/Pumps: AROM;Both;10 reps;Seated Quad Sets: Strengthening;Both;10 reps;Seated Gluteal Sets: Strengthening;Both;5 reps;Seated    General Comments        Pertinent Vitals/Pain Pain Assessment: No/denies pain Pain Score: 0-No pain Pain Intervention(s): Monitored during session    Home Living Family/patient expects to be discharged to:: Private residence Living Arrangements: Alone Available Help at Discharge: Family;Available 24 hours/day Type of Home: House Home Access: Ramped entrance   Home Layout: One level Home Equipment: Walker - 2 wheels;Bedside commode;Shower seat;Other (comment) (3 in 1)      Prior Function Level of Independence: Independent          PT Goals (current goals can now be found in the care plan section) Acute Rehab PT Goals Patient Stated Goal: Walk 2 miles PT Goal Formulation: With patient Time For Goal Achievement: 05/20/14 Potential to Achieve Goals: Good Progress towards PT goals: Progressing toward goals    Frequency  7X/week    PT Plan Current plan remains appropriate    Co-evaluation             End of Session Equipment Utilized During Treatment: Gait belt Activity  Tolerance: Patient limited by fatigue Patient left: in chair;with call bell/phone within reach;with family/visitor present     Time: 1445-1501 PT Time Calculation (min): 16 min  Charges:  $Gait Training: 8-22 mins $Therapeutic Activity: 8-22 mins                    G Codes:      Jacqualyn Posey 05/13/2014, 3:24 PM  05/13/2014 Jacqualyn Posey PTA [phone] pager [phone] office

## 2014-05-13 NOTE — Progress Notes (Signed)
Patient ID: Jill [name], female   DOB: 06-20-1932, 80 y.o.   MRN: [id number] PATIENT ID: Jill [name]  MRN: [id number]  DOB/AGE:  [birth year] / 80 y.o.  1 Day Post-Op Procedure(s) (LRB): TOTAL HIP ARTHROPLASTY (Left)    PROGRESS NOTE Subjective: Patient is alert, oriented, no Nausea, no Vomiting, yes passing gas, no Bowel Movement. Taking PO well. Denies SOB, Chest or Calf Pain. Using Incentive Spirometer, PAS in place. Ambulate WBAT Patient reports pain as 3 on 0-10 scale  .    Objective: Vital signs in last 24 hours: Filed Vitals:   05/13/14 0000 05/13/14 0400 05/13/14 0540 05/13/14 0730  BP:   134/50   Pulse:   94 95  Temp:   99.2 F (37.3 C) 98.3 F (36.8 C)  TempSrc:   Oral Oral  Resp: 16 16 16 18   Height:      Weight:      SpO2: 93% 93% 99% 95%      Intake/Output from previous day: I/O last 3 completed shifts: In: 1240 [P.O.:240; I.V.:1000] Out: 400 [Blood:400]   Intake/Output this shift: Total I/O In: 120 [P.O.:120] Out: -    LABORATORY DATA:  Recent Labs  05/12/14 1728 05/12/14 2228 05/13/14 0537 05/13/14 0646  WBC  --   --  10.8*  --   HGB  --   --  9.2*  --   HCT  --   --  28.4*  --   PLT  --   --  265  --   NA  --   --  135*  --   K  --   --  4.7  --   CL  --   --  101  --   CO2  --   --  24  --   BUN  --   --  25*  --   CREATININE  --   --  1.69*  --   GLUCOSE  --   --  150*  --   GLUCAP 128* 174*  --  156*  CALCIUM  --   --  7.9*  --     Examination: Neurologically intact ABD soft Neurovascular intact Sensation intact distally Intact pulses distally Dorsiflexion/Plantar flexion intact Incision: dressing C/D/I No cellulitis present Compartment soft} XR AP&Lat of hip shows well placed\fixed THA  Assessment:   1 Day Post-Op Procedure(s) (LRB): TOTAL HIP ARTHROPLASTY (Left) ADDITIONAL DIAGNOSIS:  Expected Acute Blood Loss Anemia, Diabetes, Hypertension and Renal Insufficiency Chronic  Plan: PT/OT WBAT, THA  posterior  precautions  DVT Prophylaxis: SCDx72 hrs, ASA 325 mg BID x 2 weeks  DISCHARGE PLAN: Home  DISCHARGE NEEDS: HHPT, HHRN, CPM, Walker and 3-in-1 comode seat

## 2014-05-14 DIAGNOSIS — M1610 Unilateral primary osteoarthritis, unspecified hip: Secondary | ICD-10-CM | POA: Diagnosis not present

## 2014-05-14 DIAGNOSIS — M169 Osteoarthritis of hip, unspecified: Secondary | ICD-10-CM | POA: Diagnosis not present

## 2014-05-14 LAB — CBC
HEMATOCRIT: 28.8 % — AB (ref 36.0–46.0)
HEMOGLOBIN: 9.3 g/dL — AB (ref 12.0–15.0)
MCH: 29.3 pg (ref 26.0–34.0)
MCHC: 32.3 g/dL (ref 30.0–36.0)
MCV: 90.9 fL (ref 78.0–100.0)
Platelets: 266 10*3/uL (ref 150–400)
RBC: 3.17 MIL/uL — ABNORMAL LOW (ref 3.87–5.11)
RDW: 14 % (ref 11.5–15.5)
WBC: 11.7 10*3/uL — AB (ref 4.0–10.5)

## 2014-05-14 LAB — GLUCOSE, CAPILLARY
GLUCOSE-CAPILLARY: 153 mg/dL — AB (ref 70–99)
Glucose-Capillary: 148 mg/dL — ABNORMAL HIGH (ref 70–99)
Glucose-Capillary: 109 mg/dL — ABNORMAL HIGH (ref 70–99)
Glucose-Capillary: 153 mg/dL — ABNORMAL HIGH (ref 70–99)

## 2014-05-14 NOTE — Progress Notes (Signed)
PATIENT ID: Jill [name]  MRN: [id number]  DOB/AGE:  12/14/1931 / 80 y.o.  2 Days Post-Op Procedure(s) (LRB): TOTAL HIP ARTHROPLASTY (Left)    PROGRESS NOTE Subjective: Patient is alert, oriented, no Nausea, no Vomiting, yes passing gas, no Bowel Movement. Taking PO well. Denies SOB, Chest or Calf Pain. Using Incentive Spirometer, PAS in place. Ambulate WBAT with pt up with therapy yesterday. Patient reports pain as 0 on 0-10 scale and 5 on 0-10 scale  .    Objective: Vital signs in last 24 hours: Filed Vitals:   05/13/14 2200 05/14/14 0000 05/14/14 0400 05/14/14 0547  BP: 109/40   120/49  Pulse: 95   102  Temp: 98.2 F (36.8 C)   98.4 F (36.9 C)  TempSrc:      Resp: 18 18 18 18   Height:      Weight:      SpO2: 95% 94% 94% 94%      Intake/Output from previous day: I/O last 3 completed shifts: In: 720 [P.O.:720] Out: 50 [Urine:50]   Intake/Output this shift:     LABORATORY DATA:  Recent Labs  05/13/14 0537  05/13/14 1656 05/13/14 2127 05/14/14 0639  WBC 10.8*  --   --   --   --   HGB 9.2*  --   --   --   --   HCT 28.4*  --   --   --   --   PLT 265  --   --   --   --   NA 135*  --   --   --   --   K 4.7  --   --   --   --   CL 101  --   --   --   --   CO2 24  --   --   --   --   BUN 25*  --   --   --   --   CREATININE 1.69*  --   --   --   --   GLUCOSE 150*  --   --   --   --   GLUCAP  --   < > 162* 132* 153*  CALCIUM 7.9*  --   --   --   --   < > = values in this interval not displayed.  Examination: Neurologically intact Neurovascular intact Sensation intact distally Intact pulses distally Dorsiflexion/Plantar flexion intact Incision: dressing C/D/I No cellulitis present Compartment soft} XR AP&Lat of hip shows well placed\fixed THA  Assessment:   2 Days Post-Op Procedure(s) (LRB): TOTAL HIP ARTHROPLASTY (Left) ADDITIONAL DIAGNOSIS:  Expected Acute Blood Loss Anemia, Diabetes, Hypertension and Renal Insufficiency Chronic  Plan: PT/OT WBAT,  THA  posterior precautions  DVT Prophylaxis: SCDx72 hrs, ASA 325 mg BID x 2 weeks  DISCHARGE PLAN: Home, later today once she passes therapy.  DISCHARGE NEEDS: HHPT, HHRN, Walker and 3-in-1 comode seat

## 2014-05-14 NOTE — Discharge Summary (Signed)
Patient ID: AYNSLEE VENUS MRN: [id number] DOB/AGE: [birth year] 80 y.o.  Admit date: 05/12/2014 Discharge date: 05/14/2014  Admission Diagnoses:  Principal Problem:   Arthritis of left hip Active Problems:   Arthritis, hip   Discharge Diagnoses:  Same  Past Medical History  Diagnosis Date  . Diabetes mellitus   . Hyperlipidemia   . Chronic kidney disease   . Depression   . Allergy   . Hypertension   . Vasovagal syncope 2006    Negative cardiac workup-myoview, echo  . Diverticula, colon   . Family history of anesthesia complication     Son is difficult intubation  . Complication of anesthesia     difficult waking   . Arthritis     osteoarthritis     Surgeries: Procedure(s): TOTAL HIP ARTHROPLASTY on 05/12/2014   Consultants:    Discharged Condition: Improved  Hospital Course: CAMRYNN MACBRIDE is an 80 y.o. female who was admitted 05/12/2014 for operative treatment ofArthritis of left hip. Patient has severe unremitting pain that affects sleep, daily activities, and work/hobbies. After pre-op clearance the patient was taken to the operating room on 05/12/2014 and underwent  Procedure(s): TOTAL HIP ARTHROPLASTY.    Patient was given perioperative antibiotics: Anti-infectives   Start     Dose/Rate Route Frequency Ordered Stop   05/12/14 1041  cefUROXime (ZINACEF) injection  Status:  Discontinued       As needed 05/12/14 1041 05/12/14 1158   05/12/14 0600  ceFAZolin (ANCEF) IVPB 2 g/50 mL premix     2 g 100 mL/hr over 30 Minutes Intravenous On call to O.R. 05/11/14 1354 05/12/14 1021       Patient was given sequential compression devices, early ambulation, and chemoprophylaxis to prevent DVT.  Patient benefited maximally from hospital stay and there were no complications.    Recent vital signs: Patient Vitals for the past 24 hrs:  BP Temp Temp src Pulse Resp SpO2  05/14/14 0547 120/49 mmHg 98.4 F (36.9 C) - 102 18 94 %  05/14/14 0400 - - - - 18 94 %  05/14/14  0000 - - - - 18 94 %  05/13/14 2200 109/40 mmHg 98.2 F (36.8 C) - 95 18 95 %  05/13/14 2000 - - - - 18 95 %  05/13/14 1600 - - - - 18 -  05/13/14 1417 108/46 mmHg 98.9 F (37.2 C) Oral 91 18 95 %  05/13/14 1345 101/48 mmHg 98.2 F (36.8 C) - 92 17 95 %  05/13/14 1200 - - - - 18 -  05/13/14 0900 106/47 mmHg - - - - -     Recent laboratory studies:  Recent Labs  05/13/14 0537  WBC 10.8*  HGB 9.2*  HCT 28.4*  PLT 265  NA 135*  K 4.7  CL 101  CO2 24  BUN 25*  CREATININE 1.69*  GLUCOSE 150*  CALCIUM 7.9*     Discharge Medications:     Medication List    STOP taking these medications       aspirin 81 MG chewable tablet  Replaced by:  aspirin EC 325 MG tablet      TAKE these medications       aspirin EC 325 MG tablet  Take 1 tablet (325 mg total) by mouth 2 (two) times daily.     citalopram 40 MG tablet  Commonly known as:  CELEXA  Take 1 tablet (40 mg total) by mouth daily.     fenofibrate 54 MG tablet  Take  108 mg by mouth daily.     glucose blood test strip  One Touch-Use as directed-2 to 3 times a day     metFORMIN 500 MG tablet  Commonly known as:  GLUCOPHAGE  Take 500 mg by mouth daily.     oxyCODONE-acetaminophen 5-325 MG per tablet  Commonly known as:  ROXICET  Take 1 tablet by mouth every 4 (four) hours as needed.     tizanidine 2 MG capsule  Commonly known as:  ZANAFLEX  Take 1 capsule (2 mg total) by mouth 3 (three) times daily.     VITAMIN B 12 PO  Take 1 tablet by mouth daily.        Diagnostic Studies: Dg Chest 2 View  05/08/2014   CLINICAL DATA:  Short of breath  EXAM: CHEST  2 VIEW  COMPARISON:  01/20/2009  FINDINGS: Heart size appears normal. No pleural effusion or edema. No airspace consolidation identified. Mild peribronchial thickening is again noted and appears similar to previous exam.  IMPRESSION: 1. No acute findings. 2. Chronic bronchitic changes noted   Electronically Signed   By: Kerby Moors M.D.   On: 05/08/2014  15:57   Dg Pelvis Portable  05/12/2014   CLINICAL DATA:  Postop left hip replacement  EXAM: PORTABLE PELVIS 1-2 VIEWS  COMPARISON:  Radiograph 01/23/2009  FINDINGS: Interval left total hip arthroplasty. The femoral component and acetabular component well seated. No evidence fracture. Right hip arthroplasty again noted.  IMPRESSION: No complication following left hip arthroplasty.   Electronically Signed   By: Suzy Bouchard M.D.   On: 05/12/2014 12:57    Disposition:       Discharge Instructions   Call MD / Call 911    Complete by:  As directed   If you experience chest pain or shortness of breath, CALL 911 and be transported to the hospital emergency room.  If you develope a fever above 101 F, pus (white drainage) or increased drainage or redness at the wound, or calf pain, call your surgeon's office.     Change dressing    Complete by:  As directed   You may change your dressing on day 5, then change the dressing daily with sterile 4 x 4 inch gauze dressing and paper tape.  You may clean the incision with alcohol prior to redressing     Constipation Prevention    Complete by:  As directed   Drink plenty of fluids.  Prune juice may be helpful.  You may use a stool softener, such as Colace (over the counter) 100 mg twice a day.  Use MiraLax (over the counter) for constipation as needed.     Diet - low sodium heart healthy    Complete by:  As directed      Discharge instructions    Complete by:  As directed   Plant to D/C home later today once pt passes therapy goals.     Driving restrictions    Complete by:  As directed   No driving for 2 weeks     Follow the hip precautions as taught in Physical Therapy    Complete by:  As directed      Increase activity slowly as tolerated    Complete by:  As directed      Patient may shower    Complete by:  As directed   You may shower without a dressing once there is no drainage.  Do not wash over the wound.  If drainage  remains, cover wound  with plastic wrap and then shower.           Follow-up Information   Follow up with Kerin Salen, MD In 2 weeks.   Specialty:  Orthopedic Surgery   Contact information:   [place] [id number] [phone]        Signed: Hardin Negus Jahrel Borthwick R 05/14/2014, 8:05 AM

## 2014-05-14 NOTE — Evaluation (Signed)
Occupational Therapy Evaluation Patient Details Name: Jill [name] MRN: [id number] DOB: 20-Aug-1932 Today's Date: 05/14/2014    History of Present Illness 80 y.o. female s/p left total hip arthroplasty. Hx of syncope, HTN, chest pain, DM and chronic kidney disease.   Clinical Impression   Pt admitted with the above diagnoses and presents with below problem list. Pt will benefit from continued acute OT to address the below listed deficits and maximize independence with basic ADLs prior to d/c home with 24 hour assistance from family. PTA pt was independent with ADLs. Pt currently at min A level for LB ADLs. Discussed with pt and daughter type of assistance needed at home and educated both parties on techniques and AE for safe ADL completion including toilet/shower transfers. Pt and daughter indicated understanding and agreement with home d/c plan and assistance that family will need to provide. Acute OT to continue to follow.      Follow Up Recommendations  Supervision/Assistance - 24 hour;No OT follow up    Equipment Recommendations  None recommended by OT;Other (comment) (Pt has recommended equipment)    Recommendations for Other Services       Precautions / Restrictions Precautions Precautions: Posterior Hip Precaution Comments: pt able to state 2/3 precautions; Reviewed posterior hip precautions Restrictions Weight Bearing Restrictions: Yes LLE Weight Bearing: Weight bearing as tolerated      Mobility Bed Mobility               General bed mobility comments: pt in recliner   Transfers Overall transfer level: Needs assistance Equipment used: Rolling walker (2 wheeled) Transfers: Sit to/from Stand Sit to Stand: Min assist         General transfer comment: min A for balance, cues for hand placement, slightly increased speed during descent into recliner    Balance Overall balance assessment: Needs assistance Sitting-balance support: No upper extremity  supported;Feet supported Sitting balance-Leahy Scale: Fair     Standing balance support: Bilateral upper extremity supported;During functional activity Standing balance-Leahy Scale: Poor                              ADL Overall ADL's : Needs assistance/impaired Eating/Feeding: Set up;Sitting   Grooming: Set up;Sitting   Upper Body Bathing: Set up;Sitting   Lower Body Bathing: Minimal assistance;With adaptive equipment;Sit to/from stand Lower Body Bathing Details (indicate cue type and reason): min A for balance with use of AE and technique Upper Body Dressing : Set up;Sitting   Lower Body Dressing: Minimal assistance;Sit to/from stand;With adaptive equipment   Toilet Transfer: Min guard;Ambulation;RW;BSC   Toileting- Clothing Manipulation and Hygiene: Minimal assistance;Sit to/from stand Toileting - Clothing Manipulation Details (indicate cue type and reason): min A for balance Tub/ Shower Transfer: Min guard;Ambulation;3 in 1;Rolling walker   Functional mobility during ADLs: Min guard;Rolling walker ( in-room distance this session) General ADL Comments: Pt at min A level for LB ADLs due to balance in standing. Discussed with pt and daughter type of assistance needed at home and educated both parties on techniques and AE for ADL completion. Pt and daughter indicated understanding and agreement with home d/c plan and assistance that family will need to provide.     Vision                     Perception     Praxis      Pertinent Vitals/Pain Pain Assessment: 0-10 Pain Score: 5  Pain Location:  L hip Pain Descriptors / Indicators: Aching Pain Intervention(s): Limited activity within patient's tolerance;Monitored during session;Repositioned;Utilized relaxation techniques (breathing techniques, cued to not hold breath)     Hand Dominance Right   Extremity/Trunk Assessment Upper Extremity Assessment Upper Extremity Assessment: Overall WFL for tasks  assessed   Lower Extremity Assessment Lower Extremity Assessment: Defer to PT evaluation       Communication Communication Communication: No difficulties   Cognition Arousal/Alertness: Awake/alert Behavior During Therapy: WFL for tasks assessed/performed Overall Cognitive Status: Within Functional Limits for tasks assessed                     General Comments       Exercises       Shoulder Instructions      Home Living Family/patient expects to be discharged to:: Private residence Living Arrangements: Alone Available Help at Discharge: Family;Available 24 hours/day;Other (Comment) (daughter-in-law will provide 24 hour care) Type of Home: House Home Access: Ramped entrance     [place]: One level     Bathroom Shower/Tub: Teacher, early years/pre: Standard Bathroom Accessibility: Yes How Accessible: Accessible via walker Home Equipment: [place] - 2 wheels;Bedside commode;Shower seat;Other (comment);Adaptive equipment Adaptive Equipment: Reacher;Sock aid;Long-handled shoe horn Additional Comments: educated on AE       Prior Functioning/Environment Level of Independence: Independent             OT Diagnosis: Acute pain   OT Problem List: Decreased activity tolerance;Impaired balance (sitting and/or standing);Decreased knowledge of use of DME or AE;Decreased knowledge of precautions;Pain   OT Treatment/Interventions: Self-care/ADL training;Therapeutic exercise;Energy conservation;DME and/or AE instruction;Therapeutic activities;Patient/family education;Balance training    OT Goals(Current goals can be found in the care plan section) Acute Rehab OT Goals Patient Stated Goal: not stated OT Goal Formulation: With patient/family Time For Goal Achievement: 05/21/14 Potential to Achieve Goals: Good ADL Goals Pt Will Perform Lower Body Bathing: with supervision;with adaptive equipment;sit to/from stand Pt Will Perform Lower Body Dressing: with  supervision;with adaptive equipment;sit to/from stand Pt Will Transfer to Toilet: with supervision;ambulating (3n1 over toilet) Pt Will Perform Toileting - Clothing Manipulation and hygiene: with supervision;sit to/from stand;with adaptive equipment Pt Will Perform Tub/Shower Transfer: with supervision;ambulating;3 in 1;rolling walker  OT Frequency: Min 3X/week   Barriers to D/C:            Co-evaluation              End of Session Equipment Utilized During Treatment: Gait belt;Rolling walker  Activity Tolerance: Patient tolerated treatment well;Patient limited by pain;Patient limited by fatigue Patient left: in chair;with call bell/phone within reach;with family/visitor present   Time: 1127-1201 OT Time Calculation (min): 34 min Charges:  OT General Charges $OT Visit: 1 Procedure OT Evaluation $Initial OT Evaluation Tier I: 1 Procedure OT Treatments $Self Care/Home Management : 8-22 mins G-Codes:    Hortencia Pilar 06/02/14, 1:11 PM

## 2014-05-14 NOTE — Progress Notes (Signed)
Physical Therapy Treatment Patient Details Name: Jill [name] MRN: [id number] DOB: September 26, 1931 Today's Date: 05/14/2014    History of Present Illness 80 y.o. female s/p left total hip arthroplasty. Hx of syncope, HTN, chest pain, DM and chronic kidney disease.    PT Comments    Patient progressing with ambulation and therex this session. Unsure if patient is safe to DC today. Will have to see how she progresses this afternoon. Patient daughter in law will be assisting at home. Patient has ramp to enter house and has a WC if she did need to use it.   Follow Up Recommendations  Home health PT;Supervision for mobility/OOB     Equipment Recommendations  None recommended by PT    Recommendations for Other Services       Precautions / Restrictions Precautions Precautions: Posterior Hip Precaution Comments: Reviewed posterior hip precautions Restrictions Weight Bearing Restrictions: Yes LLE Weight Bearing: Weight bearing as tolerated    Mobility  Bed Mobility               General bed mobility comments: Patient up in recliner before and after session  Transfers Overall transfer level: Needs assistance Equipment used: Rolling walker (2 wheeled) Transfers: Sit to/from Stand Sit to Stand: Min assist            Ambulation/Gait Ambulation/Gait assistance: Min assist Ambulation Distance (Feet): 60 Feet Assistive device: Rolling walker (2 wheeled) Gait Pattern/deviations: Step-to pattern;Decreased stance time - left;Decreased step length - right   Gait velocity interpretation: Below normal speed for age/gender General Gait Details: A for managment of RW. Cued for sequency and posture. One seated rest break required   Stairs            Wheelchair Mobility    Modified Rankin (Stroke Patients Only)       Balance                                    Cognition Arousal/Alertness: Awake/alert Behavior During Therapy: WFL for tasks  assessed/performed Overall Cognitive Status: Within Functional Limits for tasks assessed                      Exercises Total Joint Exercises Quad Sets: Strengthening;Both;10 reps;Seated Gluteal Sets: AROM;Both;10 reps Short Arc QuadSinclair Ship;Left;10 reps Heel Slides: AAROM;Left;10 reps Hip ABduction/ADduction: AAROM;Left;10 reps    General Comments        Pertinent Vitals/Pain Pain Assessment: No/denies pain Pain Score: 0-No pain    Home Living                      Prior Function            PT Goals (current goals can now be found in the care plan section) Progress towards PT goals: Progressing toward goals    Frequency  7X/week    PT Plan Current plan remains appropriate    Co-evaluation             End of Session Equipment Utilized During Treatment: Gait belt Activity Tolerance: Patient tolerated treatment well Patient left: in chair;with call bell/phone within reach;with family/visitor present     Time: [id number] PT Time Calculation (min): 38 min  Charges:  $Gait Training: 23-37 mins $Therapeutic Exercise: 8-22 mins                    G Codes:  Jacqualyn Posey 05/14/2014, 10:27 AM 05/14/2014 Jacqualyn Posey PTA [phone] pager [phone] office

## 2014-05-14 NOTE — Progress Notes (Signed)
Physical Therapy Treatment Patient Details Name: Jill [name] MRN: [id number] DOB: Apr 03, 1932 Today's Date: 05/14/2014    History of Present Illness 80 y.o. female s/p left total hip arthroplasty. Hx of syncope, HTN, chest pain, DM and chronic kidney disease.    PT Comments    Patient continues to make progress but did fatigue quickly and fall asleep with exercising. Patient would benefit from another session of PT in AM to ensure safety and efficiency with mobility prior to return home. Family in agreement as well as OT  Follow Up Recommendations  Home health PT;Supervision for mobility/OOB     Equipment Recommendations  None recommended by PT    Recommendations for Other Services       Precautions / Restrictions Precautions Precautions: Posterior Hip Precaution Comments: Patient and family given handout for precautions Restrictions Weight Bearing Restrictions: Yes LLE Weight Bearing: Weight bearing as tolerated    Mobility  Bed Mobility               General bed mobility comments: pt in recliner   Transfers Overall transfer level: Needs assistance Equipment used: Rolling walker (2 wheeled) Transfers: Sit to/from Stand Sit to Stand: Min assist         General transfer comment: min A for balance, cues for hand placement  Ambulation/Gait Ambulation/Gait assistance: Min assist Ambulation Distance (Feet): 80 Feet Assistive device: Rolling walker (2 wheeled) Gait Pattern/deviations: Step-to pattern;Decreased stance time - left;Decreased step length - right   Gait velocity interpretation: Below normal speed for age/gender General Gait Details: A for managment of RW. Cued for sequency and posture. no rest break required this session   Stairs            Wheelchair Mobility    Modified Rankin (Stroke Patients Only)       Balance Overall balance assessment: Needs assistance Sitting-balance support: No upper extremity supported;Feet  supported Sitting balance-Leahy Scale: Fair     Standing balance support: Bilateral upper extremity supported;During functional activity Standing balance-Leahy Scale: Poor                      Cognition Arousal/Alertness: Awake/alert Behavior During Therapy: WFL for tasks assessed/performed Overall Cognitive Status: Within Functional Limits for tasks assessed                      Exercises Total Joint Exercises Ankle Circles/Pumps: AROM;Both;10 reps;Seated Quad Sets: Strengthening;Both;10 reps;Seated Gluteal Sets: AROM;Both;10 reps Heel Slides: AAROM;Left;10 reps Hip ABduction/ADduction: AAROM;Left;10 reps Long Arc Quad: AROM;Left;10 reps    General Comments        Pertinent Vitals/Pain Pain Assessment: 0-10 Pain Score: 5  Pain Location: L hip Pain Descriptors / Indicators: Aching;Sore Pain Intervention(s): Monitored during session    Home Living Family/patient expects to be discharged to:: Private residence Living Arrangements: Alone Available Help at Discharge: Family;Available 24 hours/day;Other (Comment) (daughter-in-law will provide 24 hour care) Type of Home: House Home Access: Ramped entrance   Home Layout: One level Home Equipment: [place] - 2 wheels;Bedside commode;Shower seat;Other (comment);Adaptive equipment Additional Comments: educated on AE     Prior Function Level of Independence: Independent          PT Goals (current goals can now be found in the care plan section) Acute Rehab PT Goals Patient Stated Goal: not stated Progress towards PT goals: Progressing toward goals    Frequency  7X/week    PT Plan Current plan remains appropriate    Co-evaluation  End of Session Equipment Utilized During Treatment: Gait belt Activity Tolerance: Patient tolerated treatment well Patient left: in chair;with call bell/phone within reach;with family/visitor present     Time: [id number] PT Time Calculation (min): 29  min  Charges:  $Gait Training: 8-22 mins $Therapeutic Exercise: 8-22 mins                    G Codes:      Jacqualyn Posey 05/14/2014, 2:29 PM 05/14/2014 Jacqualyn Posey PTA [phone] pager [phone] office

## 2014-05-14 NOTE — Care Management Note (Signed)
CARE MANAGEMENT NOTE 05/14/2014  Patient:  JADZIA, KREISEL   Account Number:  [id number]  Date Initiated:  05/14/2014  Documentation initiated by:  Ricki Miller  Subjective/Objective Assessment:   80 yr old female admitted with osteoarthritis of left hip, s/p left total hip arthroplasty.     Action/Plan:   Case manager spoke with patient and daughter concerning home health and DME needs. Choice offered. Patient states she wants [place] called to [facility].Has family support at D/C.   Anticipated DC Date:  05/15/2014   Anticipated DC Plan:  [place]  CM consult      PAC Choice  [place]   Choice offered to / List presented to:  C-1 Patient   DME arranged  [place]      DME agency  [employer]     [place] arranged  HH-2 PT      [place]   Status of service:  Completed, signed off Medicare Important Message given?  NA - LOS <3 / Initial given by admissions (If response is "NO", the following Medicare IM given date fields will be blank) Date Medicare IM given:   Medicare IM given by:   Date Additional Medicare IM given:   Additional Medicare IM given by:    Discharge Disposition:  [place]  Per UR Regulation:  Reviewed for med. necessity/level of care/duration of stay

## 2014-05-15 ENCOUNTER — Emergency Department (HOSPITAL_COMMUNITY): Payer: Medicare PPO

## 2014-05-15 ENCOUNTER — Encounter (HOSPITAL_COMMUNITY): Payer: Self-pay | Admitting: Emergency Medicine

## 2014-05-15 ENCOUNTER — Inpatient Hospital Stay (HOSPITAL_COMMUNITY)
Admission: EM | Admit: 2014-05-15 | Discharge: 2014-05-19 | DRG: 069 | Disposition: A | Discharge status: Home Health | Payer: Medicare PPO | Attending: Internal Medicine | Admitting: Internal Medicine

## 2014-05-15 DIAGNOSIS — Z8249 Family history of ischemic heart disease and other diseases of the circulatory system: Secondary | ICD-10-CM

## 2014-05-15 DIAGNOSIS — E1169 Type 2 diabetes mellitus with other specified complication: Secondary | ICD-10-CM | POA: Diagnosis present

## 2014-05-15 DIAGNOSIS — E669 Obesity, unspecified: Secondary | ICD-10-CM | POA: Diagnosis present

## 2014-05-15 DIAGNOSIS — G9340 Encephalopathy, unspecified: Secondary | ICD-10-CM | POA: Diagnosis present

## 2014-05-15 DIAGNOSIS — I739 Peripheral vascular disease, unspecified: Secondary | ICD-10-CM

## 2014-05-15 DIAGNOSIS — G459 Transient cerebral ischemic attack, unspecified: Secondary | ICD-10-CM | POA: Diagnosis not present

## 2014-05-15 DIAGNOSIS — R0902 Hypoxemia: Secondary | ICD-10-CM

## 2014-05-15 DIAGNOSIS — F339 Major depressive disorder, recurrent, unspecified: Secondary | ICD-10-CM | POA: Diagnosis present

## 2014-05-15 DIAGNOSIS — E860 Dehydration: Secondary | ICD-10-CM | POA: Diagnosis present

## 2014-05-15 DIAGNOSIS — N3000 Acute cystitis without hematuria: Secondary | ICD-10-CM

## 2014-05-15 DIAGNOSIS — N183 Chronic kidney disease, stage 3 unspecified: Secondary | ICD-10-CM | POA: Diagnosis present

## 2014-05-15 DIAGNOSIS — R4182 Altered mental status, unspecified: Secondary | ICD-10-CM

## 2014-05-15 DIAGNOSIS — E0843 Diabetes mellitus due to underlying condition with diabetic autonomic (poly)neuropathy: Secondary | ICD-10-CM | POA: Diagnosis present

## 2014-05-15 DIAGNOSIS — N289 Disorder of kidney and ureter, unspecified: Secondary | ICD-10-CM

## 2014-05-15 DIAGNOSIS — Z7982 Long term (current) use of aspirin: Secondary | ICD-10-CM

## 2014-05-15 DIAGNOSIS — I10 Essential (primary) hypertension: Secondary | ICD-10-CM | POA: Diagnosis present

## 2014-05-15 DIAGNOSIS — Z820 Family history of epilepsy and other diseases of the nervous system: Secondary | ICD-10-CM

## 2014-05-15 DIAGNOSIS — F1190 Opioid use, unspecified, uncomplicated: Secondary | ICD-10-CM

## 2014-05-15 DIAGNOSIS — N390 Urinary tract infection, site not specified: Secondary | ICD-10-CM | POA: Diagnosis present

## 2014-05-15 DIAGNOSIS — R4789 Other speech disturbances: Secondary | ICD-10-CM | POA: Diagnosis present

## 2014-05-15 DIAGNOSIS — E785 Hyperlipidemia, unspecified: Secondary | ICD-10-CM | POA: Diagnosis present

## 2014-05-15 DIAGNOSIS — M1990 Unspecified osteoarthritis, unspecified site: Secondary | ICD-10-CM | POA: Diagnosis present

## 2014-05-15 DIAGNOSIS — E119 Type 2 diabetes mellitus without complications: Secondary | ICD-10-CM | POA: Diagnosis present

## 2014-05-15 DIAGNOSIS — Z803 Family history of malignant neoplasm of breast: Secondary | ICD-10-CM

## 2014-05-15 DIAGNOSIS — Z807 Family history of other malignant neoplasms of lymphoid, hematopoietic and related tissues: Secondary | ICD-10-CM

## 2014-05-15 DIAGNOSIS — Z6834 Body mass index (BMI) 34.0-34.9, adult: Secondary | ICD-10-CM

## 2014-05-15 DIAGNOSIS — Z833 Family history of diabetes mellitus: Secondary | ICD-10-CM

## 2014-05-15 DIAGNOSIS — N184 Chronic kidney disease, stage 4 (severe): Secondary | ICD-10-CM | POA: Diagnosis present

## 2014-05-15 DIAGNOSIS — E875 Hyperkalemia: Secondary | ICD-10-CM | POA: Diagnosis present

## 2014-05-15 DIAGNOSIS — Z806 Family history of leukemia: Secondary | ICD-10-CM

## 2014-05-15 DIAGNOSIS — R079 Chest pain, unspecified: Secondary | ICD-10-CM

## 2014-05-15 DIAGNOSIS — Z96649 Presence of unspecified artificial hip joint: Secondary | ICD-10-CM

## 2014-05-15 DIAGNOSIS — Z800 Family history of malignant neoplasm of digestive organs: Secondary | ICD-10-CM

## 2014-05-15 DIAGNOSIS — E1159 Type 2 diabetes mellitus with other circulatory complications: Secondary | ICD-10-CM | POA: Diagnosis present

## 2014-05-15 DIAGNOSIS — D62 Acute posthemorrhagic anemia: Secondary | ICD-10-CM | POA: Diagnosis not present

## 2014-05-15 DIAGNOSIS — N179 Acute kidney failure, unspecified: Secondary | ICD-10-CM | POA: Diagnosis present

## 2014-05-15 DIAGNOSIS — Z87891 Personal history of nicotine dependence: Secondary | ICD-10-CM

## 2014-05-15 DIAGNOSIS — R29810 Facial weakness: Secondary | ICD-10-CM | POA: Diagnosis present

## 2014-05-15 DIAGNOSIS — D649 Anemia, unspecified: Secondary | ICD-10-CM

## 2014-05-15 DIAGNOSIS — I129 Hypertensive chronic kidney disease with stage 1 through stage 4 chronic kidney disease, or unspecified chronic kidney disease: Secondary | ICD-10-CM | POA: Diagnosis present

## 2014-05-15 DIAGNOSIS — I779 Disorder of arteries and arterioles, unspecified: Secondary | ICD-10-CM

## 2014-05-15 DIAGNOSIS — E1140 Type 2 diabetes mellitus with diabetic neuropathy, unspecified: Secondary | ICD-10-CM | POA: Diagnosis present

## 2014-05-15 DIAGNOSIS — Z888 Allergy status to other drugs, medicaments and biological substances status: Secondary | ICD-10-CM

## 2014-05-15 DIAGNOSIS — Z885 Allergy status to narcotic agent status: Secondary | ICD-10-CM

## 2014-05-15 LAB — CBC
HCT: 27.4 % — ABNORMAL LOW (ref 36.0–46.0)
HEMATOCRIT: 25.1 % — AB (ref 36.0–46.0)
Hemoglobin: 8.1 g/dL — ABNORMAL LOW (ref 12.0–15.0)
Hemoglobin: 9 g/dL — ABNORMAL LOW (ref 12.0–15.0)
MCH: 28.2 pg (ref 26.0–34.0)
MCH: 28.6 pg (ref 26.0–34.0)
MCHC: 32.3 g/dL (ref 30.0–36.0)
MCHC: 32.8 g/dL (ref 30.0–36.0)
MCV: 87.5 fL (ref 78.0–100.0)
MCV: 87 fL (ref 78.0–100.0)
PLATELETS: 308 10*3/uL (ref 150–400)
Platelets: 266 10*3/uL (ref 150–400)
RBC: 2.87 MIL/uL — ABNORMAL LOW (ref 3.87–5.11)
RBC: 3.15 MIL/uL — AB (ref 3.87–5.11)
RDW: 13.8 % (ref 11.5–15.5)
RDW: 13.8 % (ref 11.5–15.5)
WBC: 8.9 10*3/uL (ref 4.0–10.5)
WBC: 8.5 10*3/uL (ref 4.0–10.5)

## 2014-05-15 LAB — BASIC METABOLIC PANEL
ANION GAP: 13 (ref 5–15)
BUN: 40 mg/dL — ABNORMAL HIGH (ref 6–23)
CHLORIDE: 95 meq/L — AB (ref 96–112)
CO2: 24 mEq/L (ref 19–32)
Calcium: 8.6 mg/dL (ref 8.4–10.5)
Creatinine, Ser: 2.38 mg/dL — ABNORMAL HIGH (ref 0.50–1.10)
GFR calc Af Amer: 21 mL/min — ABNORMAL LOW (ref >90)
GFR calc non Af Amer: 18 mL/min — ABNORMAL LOW (ref >90)
Glucose, Bld: 196 mg/dL — ABNORMAL HIGH (ref 70–99)
Potassium: 5.5 mEq/L — ABNORMAL HIGH (ref 3.7–5.3)
SODIUM: 132 meq/L — AB (ref 137–147)

## 2014-05-15 LAB — I-STAT TROPONIN, ED: Troponin i, poc: 0.02 ng/mL (ref 0.00–0.08)

## 2014-05-15 LAB — URINALYSIS, ROUTINE W REFLEX MICROSCOPIC
Bilirubin Urine: NEGATIVE
Glucose, UA: NEGATIVE mg/dL
Hgb urine dipstick: NEGATIVE
Ketones, ur: NEGATIVE mg/dL
NITRITE: NEGATIVE
PH: 5 (ref 5.0–8.0)
Protein, ur: NEGATIVE mg/dL
SPECIFIC GRAVITY, URINE: 1.014 (ref 1.005–1.030)
Urobilinogen, UA: 0.2 mg/dL (ref 0.0–1.0)

## 2014-05-15 LAB — URINE MICROSCOPIC-ADD ON

## 2014-05-15 LAB — GLUCOSE, CAPILLARY: Glucose-Capillary: 103 mg/dL — ABNORMAL HIGH (ref 70–99)

## 2014-05-15 MED ORDER — CITALOPRAM HYDROBROMIDE 20 MG PO TABS
20.0000 mg | ORAL_TABLET | Freq: Every day | ORAL | Status: DC
  Administered 2014-05-15: 20 mg via ORAL
  Filled 2014-05-15 (×2): qty 1

## 2014-05-15 NOTE — ED Provider Notes (Signed)
CSN: [id number]     Arrival date & time 05/15/14  2123 History   First MD Initiated Contact with Patient 05/15/14 2138     Chief Complaint  Patient presents with  . Altered Mental Status     (Consider location/radiation/quality/duration/timing/severity/associated sxs/prior Treatment) HPI A LEVEL 5 CAVEAT PERTAINS DUE TO ALTERED MENTAL STATUS Pt presenting with c/o altered mental status.  Daughter states that she was discharged from hospital today after hip replacement 3 days ago.  Daughter states that while she was in the hospital she had fatigue and lethargy but she assumed this was due to the anesthesia and the pain medications that she was on.  Daughter describes her as seeming groggy and fatigued.  Her last dose of oxycodone was in the hospital earlier today prior to discharge, after going home her mental status did not improve. As the day went on, faamily decided to call 911- on their arrival her O2 sats were 90%- after oxygen her O2 sat increased to 98%.  Daughter states that she seems improved since starting on the oxygen.  tmax at home was 99.  No vomiting, no chest pain, no leg swelling.  No dysuria.  She has had decreased po intake since the surgery.  Daughter denies focal weakness, no changes in speech.  States she has seemed more tired than her usual ever since the surgery earlier in the week.    Past Medical History  Diagnosis Date  . Diabetes mellitus   . Hyperlipidemia   . Chronic kidney disease   . Depression   . Allergy   . Hypertension   . Vasovagal syncope 2006    Negative cardiac workup-myoview, echo  . Diverticula, colon   . Family history of anesthesia complication     Son is difficult intubation  . Complication of anesthesia     difficult waking   . Arthritis     osteoarthritis    Past Surgical History  Procedure Laterality Date  . Abdominal hysterectomy  1977    fibroid  . Joint replacement  2009    rt hip  . Total hip arthroplasty Left 05/12/2014    dr  Mayer Camel  . Total hip arthroplasty Left 05/12/2014    Procedure: TOTAL HIP ARTHROPLASTY;  Surgeon: Kerin Salen, MD;  Location: [facility];  Service: Orthopedics;  Laterality: Left;   Family History  Problem Relation Age of Onset  . COPD Brother   . Heart disease Brother   . Diabetes Brother   . Lymphoma Brother   . Alzheimer's disease Brother   . Heart disease Mother   . Pancreatic cancer Sister   . Alzheimer's disease Sister   . Breast cancer Sister   . Leukemia Other    History  Substance Use Topics  . Smoking status: Former Smoker -- 1.00 packs/day for 45 years    Types: Cigarettes    Quit date: 06/16/1999  . Smokeless tobacco: Never Used  . Alcohol Use: No   OB History   Grav Para Term Preterm Abortions TAB SAB Ect Mult Living                 Review of Systems ROS reviewed and all otherwise negative except for mentioned in HPI    Allergies  Lovastatin; Niacin; Statins; and Codeine  Home Medications   Prior to Admission medications   Medication Sig Start Date End Date Taking? Authorizing Provider  aspirin EC 325 MG tablet Take 1 tablet (325 mg total) by mouth 2 (two)  times daily. 05/12/14   Leighton Parody, PA-C  citalopram (CELEXA) 40 MG tablet Take 1 tablet (40 mg total) by mouth daily. 10/31/13   Amy Cletis [place], MD  Cyanocobalamin (VITAMIN B 12 PO) Take 1 tablet by mouth daily.    Historical Provider, MD  fenofibrate 54 MG tablet Take 108 mg by mouth daily.    Historical Provider, MD  glucose blood (BLOOD GLUCOSE TEST STRIPS) test strip One Touch-Use as directed-2 to 3 times a day     Historical Provider, MD  metFORMIN (GLUCOPHAGE) 500 MG tablet Take 500 mg by mouth daily.     Historical Provider, MD  oxyCODONE-acetaminophen (ROXICET) 5-325 MG per tablet Take 1 tablet by mouth every 4 (four) hours as needed. 05/12/14   Leighton Parody, PA-C  tizanidine (ZANAFLEX) 2 MG capsule Take 1 capsule (2 mg total) by mouth 3 (three) times daily. 05/12/14   Leighton Parody, PA-C   BP  121/61  Pulse 93  Temp(Src) 99.1 F (37.3 C) (Oral)  Resp 20  SpO2 100% Vitals reviewed Physical Exam Physical Examination: General appearance - alert, tired appearing, and in no distress Mental status - alert, oriented to person, place, not to time Eyes - pupils equal and reactive, extraocular eye movements intact  Mouth - mucous membranes moist, pharynx normal without lesions Chest - clear to auscultation, no wheezes, rales or rhonchi, symmetric air entry Heart - normal rate, regular rhythm, normal S1, S2, no murmurs, rubs, clicks or gallops Abdomen - soft, nontender, nondistended, no masses or organomegaly Musculoskeletal - ttp over left hip and some pain with ROM, comfortable while lying still, otherwise no joint tenderness, deformity or swelling Neuro- awake, alert, oriented x 2, strength 5/5 in extremities x 4, sensation intact, no cranial nerve deficit Extremities - peripheral pulses normal, no pedal edema, no clubbing or cyanosis Skin - normal coloration and turgor, no rashes  ED Course  Procedures (including critical care time)  12:46 AM d/w Dr. Blaine Hamper, triad hospitalist, pt to be admitted to stepdown bed.  Will start on heparin drip while awaiting VQ scan.  D/w radiologist as well for VQ scan to be performed.    CRITICAL CARE Performed by: Threasa Beards Total critical care time: 35 Critical care time was exclusive of separately billable procedures and treating other patients. Critical care was necessary to treat or prevent imminent or life-threatening deterioration. Critical care was time spent personally by me on the following activities: development of treatment plan with patient and/or surrogate as well as nursing, discussions with consultants, evaluation of patient's response to treatment, examination of patient, obtaining history from patient or surrogate, ordering and performing treatments and interventions, ordering and review of laboratory studies, ordering and review  of radiographic studies, pulse oximetry and re-evaluation of patient's condition. Labs Review Labs Reviewed  CBC - Abnormal; Notable for the following:    RBC 3.15 (*)    Hemoglobin 9.0 (*)    HCT 27.4 (*)    All other components within normal limits  BASIC METABOLIC PANEL - Abnormal; Notable for the following:    Sodium 132 (*)    Potassium 5.5 (*)    Chloride 95 (*)    Glucose, Bld 196 (*)    BUN 40 (*)    Creatinine, Ser 2.38 (*)    GFR calc non Af Amer 18 (*)    GFR calc Af Amer 21 (*)    All other components within normal limits  URINALYSIS, ROUTINE W REFLEX MICROSCOPIC - Abnormal;  Notable for the following:    Leukocytes, UA SMALL (*)    All other components within normal limits  URINE MICROSCOPIC-ADD ON - Abnormal; Notable for the following:    Casts HYALINE CASTS (*)    All other components within normal limits  I-STAT TROPOININ, ED    Imaging Review Dg Chest 2 View  05/15/2014   CLINICAL DATA:  Altered mental status and fever for 1 day  EXAM: CHEST  2 VIEW  COMPARISON:  05/08/2014  FINDINGS: The heart size and mediastinal contours are within normal limits. Both lungs are clear. The visualized skeletal structures are unremarkable.  IMPRESSION: No active cardiopulmonary disease.   Electronically Signed   By: Skipper Cliche M.D.   On: 05/15/2014 23:17     EKG Interpretation None      Date: 05/15/2014  Rate: 95  Rhythm: normal sinus rhythm  QRS Axis: normal  Intervals: normal  ST/T Wave abnormalities: normal  Conduction Disutrbances: none  Narrative Interpretation: unremarkable     MDM   Final diagnoses:  Altered mental status, unspecified altered mental status type  Renal insufficiency  Hypoxia  Hyperkalemia  Anemia, unspecified anemia type   Pt presenting with altered mental status, she is pod #3 after hip replacement.  Hypoxic per EMS now stable on 2L O2.  No signs of pneumonia on CXR.  Xray images reviewed and interpreted by me as well.  No UTI.   Will start on heparin and obtain VQ scan. Her renal insufficiency is mildly increased from prior, mild hyperkalemia- treated with kayexalate.  Pt admitted to triad, discussed VQ scan with radiologist so that tech will be called in.       Threasa Beards, MD 05/16/14 [phone]

## 2014-05-15 NOTE — Progress Notes (Signed)
Occupational Therapy Treatment and Discharge Patient Details Name: Jill [name] MRN: [id number] DOB: 12-09-1931 Today's Date: 05/15/2014    History of present illness 80 y.o. female s/p left total hip arthroplasty. Hx of syncope, HTN, chest pain, DM and chronic kidney disease.   OT comments  All acute OT education completed, have changed recommendation to [place] (v. No follow up which was originally recommended) due to slow progression (CM made aware). Acute OT will sign off.  Follow Up Recommendations  Home health OT;Supervision/Assistance - 24 hour    Equipment Recommendations  None recommended by OT       Precautions / Restrictions Precautions Precautions: Posterior Hip;Fall Restrictions Weight Bearing Restrictions: No LLE Weight Bearing: Weight bearing as tolerated       Mobility Bed Mobility               General bed mobility comments: Pt up in recliner upon my arrival  Transfers Overall transfer level: Needs assistance Equipment used: Rolling walker (2 wheeled) Transfers: Sit to/from Stand Sit to Stand: Min guard (due to a tendency for posterior lean intermittently)              Balance Overall balance assessment: Needs assistance Sitting-balance support: No upper extremity supported;Feet supported Sitting balance-Leahy Scale: Fair   Postural control: Posterior lean Standing balance support: Single extremity supported Standing balance-Leahy Scale: Poor                     ADL Overall ADL's : Needs assistance/impaired                 Upper Body Dressing : Minimal assistance;Sitting (to clasp bra)   Lower Body Dressing: Min guard;With adaptive equipment;Sit to/from stand (cuing for hip precautions)   Toilet Transfer: Minimal assistance;Ambulation;RW;BSC (over [employer])   Toileting- Water quality scientist and Hygiene: Min guard;Sit to/from stand                          Cognition   Behavior During Therapy: [facility] for tasks  assessed/performed Overall Cognitive Status: Within Functional Limits for tasks assessed                                    Pertinent Vitals/ Pain       Pain Assessment: 0-10 Pain Score: 3  Pain Location: left hip Pain Descriptors / Indicators: Sore Pain Intervention(s): Monitored during session         Frequency Min 3X/week     Progress Toward Goals  OT Goals(current goals can now be found in the care plan section)  Progress towards OT goals:  (All acute OT education completed, follow up [place] recommended)     Plan Discharge plan needs to be updated       End of Session Equipment Utilized During Treatment: Gait belt;Rolling walker (AE)   Activity Tolerance Patient limited by fatigue (I am worn out)   Patient Left in chair;with call bell/phone within reach;with family/visitor present   Nurse Communication  (Pt ready to go from therapy standpoint)        Time: [id number] OT Time Calculation (min): 31 min  Charges: OT General Charges $OT Visit: 1 Procedure OT Treatments $Self Care/Home Management : 23-37 mins  Almon Register [id number] 05/15/2014, 12:23 PM

## 2014-05-15 NOTE — Progress Notes (Signed)
Physical Therapy Treatment Patient Details Name: Jill [name] MRN: [id number] DOB: 1932-03-18 Today's Date: 05/15/2014    History of Present Illness 80 y.o. female s/p left total hip arthroplasty. Hx of syncope, HTN, chest pain, DM and chronic kidney disease.    PT Comments    Patient progressing well and very eager to DC home. Patient safe to D/C from a mobility standpoint based on progression towards goals set on PT eval.    Follow Up Recommendations  Home health PT;Supervision for mobility/OOB     Equipment Recommendations  None recommended by PT    Recommendations for Other Services       Precautions / Restrictions Precautions Precautions: Posterior Hip;Fall Restrictions Weight Bearing Restrictions: No LLE Weight Bearing: Weight bearing as tolerated    Mobility  Bed Mobility               General bed mobility comments: Pt up in recliner upon my arrival  Transfers Overall transfer level: Needs assistance Equipment used: Rolling walker (2 wheeled) Transfers: Sit to/from Stand Sit to Stand: Min guard;Min assist         General transfer comment: min A for balance, cues for hand placement  Ambulation/Gait Ambulation/Gait assistance: Min guard Ambulation Distance (Feet): 50 Feet Assistive device: Rolling walker (2 wheeled) Gait Pattern/deviations: Decreased step length - right;Decreased stance time - left;Step-to pattern     General Gait Details:  Cued for sequency and posture. no rest break required this session   Stairs            Wheelchair Mobility    Modified Rankin (Stroke Patients Only)       Balance Overall balance assessment: Needs assistance Sitting-balance support: No upper extremity supported;Feet supported Sitting balance-Leahy Scale: Fair   Postural control: Posterior lean Standing balance support: Single extremity supported Standing balance-Leahy Scale: Poor                      Cognition Arousal/Alertness:  Awake/alert Behavior During Therapy: WFL for tasks assessed/performed Overall Cognitive Status: Within Functional Limits for tasks assessed                      Exercises Total Joint Exercises Quad Sets: Strengthening;Both;10 reps;Seated Gluteal Sets: AROM;Both;10 reps Heel Slides: AAROM;Left;10 reps Hip ABduction/ADduction: AAROM;Left;10 reps Long Arc Quad: AROM;Left;10 reps    General Comments        Pertinent Vitals/Pain Pain Assessment: 0-10 Pain Score: 3  Pain Location: left hip Pain Descriptors / Indicators: Sore Pain Intervention(s): Monitored during session    Home Living                      Prior Function            PT Goals (current goals can now be found in the care plan section) Progress towards PT goals: Progressing toward goals    Frequency  7X/week    PT Plan Current plan remains appropriate    Co-evaluation             End of Session Equipment Utilized During Treatment: Gait belt Activity Tolerance: Patient tolerated treatment well Patient left: in chair;with call bell/phone within reach;with family/visitor present     Time: 1000-1027 PT Time Calculation (min): 27 min  Charges:  $Gait Training: 8-22 mins $Therapeutic Exercise: 8-22 mins                    G Codes:  Jacqualyn Posey 05/15/2014, 12:44 PM 05/15/2014 Jacqualyn Posey PTA [phone] pager [phone] office

## 2014-05-15 NOTE — ED Notes (Signed)
[facility] presents with a 80 yo female from home with Altered Mental Status just discharged from this facility today from hip surgery on Monday.  O2 Saturation was at 90% upon fire department arrival; pt was given 4 liters of O2 [place] and rose to 98%.  Lungs sounds clear.  CBG 252.  Pt was SOB but got better with oxygen administration via [place]. NSR on monitor.

## 2014-05-15 NOTE — Progress Notes (Signed)
Patient ID: Jill [name], female   DOB: [birth year], 80 y.o.   MRN: [id number] PATIENT ID: Jill [name]  MRN: [id number]  DOB/AGE:  23-Nov-1931 / 80 y.o.  3 Days Post-Op Procedure(s) (LRB): TOTAL HIP ARTHROPLASTY (Left)    PROGRESS NOTE Subjective: Patient is alert, oriented, no Nausea, no Vomiting, yes passing gas, 1 Bowel Movement. Taking PO well. Denies SOB, Chest or Calf Pain. Using Incentive Spirometer, PAS in place. Ambulate 80 feet Patient reports pain as 3 on 0-10 scale  .    Objective: Vital signs in last 24 hours: Filed Vitals:   05/14/14 0800 05/14/14 1350 05/14/14 2154 05/15/14 0556  BP: 106/44 112/58 112/61 121/46  Pulse: 88 94 89 90  Temp: 98.5 F (36.9 C) 98.2 F (36.8 C) 98.4 F (36.9 C) 98 F (36.7 C)  TempSrc:      Resp: 16 19 18 18   Height:      Weight:      SpO2: 91% 99% 98% 92%      Intake/Output from previous day: I/O last 3 completed shifts: In: 1160 [P.O.:1160] Out: -    Intake/Output this shift:     LABORATORY DATA:  Recent Labs  05/13/14 0537  05/14/14 0645  05/14/14 1708 05/14/14 2130 05/15/14 0616 05/15/14 0625  WBC 10.8*  --  11.7*  --   --   --   --  8.9  HGB 9.2*  --  9.3*  --   --   --   --  8.1*  HCT 28.4*  --  28.8*  --   --   --   --  25.1*  PLT 265  --  266  --   --   --   --  266  NA 135*  --   --   --   --   --   --   --   K 4.7  --   --   --   --   --   --   --   CL 101  --   --   --   --   --   --   --   CO2 24  --   --   --   --   --   --   --   BUN 25*  --   --   --   --   --   --   --   CREATININE 1.69*  --   --   --   --   --   --   --   GLUCOSE 150*  --   --   --   --   --   --   --   GLUCAP  --   < >  --   < > 109* 153* 103*  --   CALCIUM 7.9*  --   --   --   --   --   --   --   < > = values in this interval not displayed.  Examination: Neurologically intact ABD soft Neurovascular intact Sensation intact distally Intact pulses distally Dorsiflexion/Plantar flexion intact Incision: dressing C/D/I No  cellulitis present Compartment soft} XR AP&Lat of hip shows well placed\fixed THA  Assessment:   3 Days Post-Op Procedure(s) (LRB): TOTAL HIP ARTHROPLASTY (Left) ADDITIONAL DIAGNOSIS:  Expected Acute Blood Loss Anemia, Diabetes, Hypertension and depression, chronic stage III, kidney disease  Plan: PT/OT WBAT, THA  posterior precautions  DVT Prophylaxis: SCDx72 hrs, ASA  325 mg BID x 2 weeks  DISCHARGE PLAN: Home, After physical therapy today  DISCHARGE NEEDS: HHPT, HHRN, CPM, [name] and 3-in-1 comode seat

## 2014-05-16 ENCOUNTER — Inpatient Hospital Stay (HOSPITAL_COMMUNITY): Payer: Medicare PPO

## 2014-05-16 DIAGNOSIS — Z803 Family history of malignant neoplasm of breast: Secondary | ICD-10-CM | POA: Diagnosis not present

## 2014-05-16 DIAGNOSIS — Z96649 Presence of unspecified artificial hip joint: Secondary | ICD-10-CM | POA: Diagnosis not present

## 2014-05-16 DIAGNOSIS — N179 Acute kidney failure, unspecified: Secondary | ICD-10-CM | POA: Diagnosis present

## 2014-05-16 DIAGNOSIS — N390 Urinary tract infection, site not specified: Secondary | ICD-10-CM | POA: Diagnosis present

## 2014-05-16 DIAGNOSIS — E875 Hyperkalemia: Secondary | ICD-10-CM | POA: Diagnosis present

## 2014-05-16 DIAGNOSIS — G459 Transient cerebral ischemic attack, unspecified: Secondary | ICD-10-CM | POA: Diagnosis present

## 2014-05-16 DIAGNOSIS — E860 Dehydration: Secondary | ICD-10-CM | POA: Diagnosis present

## 2014-05-16 DIAGNOSIS — I129 Hypertensive chronic kidney disease with stage 1 through stage 4 chronic kidney disease, or unspecified chronic kidney disease: Secondary | ICD-10-CM | POA: Diagnosis present

## 2014-05-16 DIAGNOSIS — Z833 Family history of diabetes mellitus: Secondary | ICD-10-CM | POA: Diagnosis not present

## 2014-05-16 DIAGNOSIS — R29810 Facial weakness: Secondary | ICD-10-CM | POA: Diagnosis present

## 2014-05-16 DIAGNOSIS — Z806 Family history of leukemia: Secondary | ICD-10-CM | POA: Diagnosis not present

## 2014-05-16 DIAGNOSIS — D62 Acute posthemorrhagic anemia: Secondary | ICD-10-CM | POA: Diagnosis not present

## 2014-05-16 DIAGNOSIS — Z888 Allergy status to other drugs, medicaments and biological substances status: Secondary | ICD-10-CM | POA: Diagnosis not present

## 2014-05-16 DIAGNOSIS — N183 Chronic kidney disease, stage 3 unspecified: Secondary | ICD-10-CM | POA: Diagnosis present

## 2014-05-16 DIAGNOSIS — G9340 Encephalopathy, unspecified: Secondary | ICD-10-CM | POA: Diagnosis present

## 2014-05-16 DIAGNOSIS — Z807 Family history of other malignant neoplasms of lymphoid, hematopoietic and related tissues: Secondary | ICD-10-CM | POA: Diagnosis not present

## 2014-05-16 DIAGNOSIS — Z7982 Long term (current) use of aspirin: Secondary | ICD-10-CM | POA: Diagnosis not present

## 2014-05-16 DIAGNOSIS — Z885 Allergy status to narcotic agent status: Secondary | ICD-10-CM | POA: Diagnosis not present

## 2014-05-16 DIAGNOSIS — E785 Hyperlipidemia, unspecified: Secondary | ICD-10-CM | POA: Diagnosis present

## 2014-05-16 DIAGNOSIS — Z820 Family history of epilepsy and other diseases of the nervous system: Secondary | ICD-10-CM | POA: Diagnosis not present

## 2014-05-16 DIAGNOSIS — Z87891 Personal history of nicotine dependence: Secondary | ICD-10-CM | POA: Diagnosis not present

## 2014-05-16 DIAGNOSIS — E669 Obesity, unspecified: Secondary | ICD-10-CM | POA: Diagnosis present

## 2014-05-16 DIAGNOSIS — R4182 Altered mental status, unspecified: Secondary | ICD-10-CM | POA: Insufficient documentation

## 2014-05-16 DIAGNOSIS — Z800 Family history of malignant neoplasm of digestive organs: Secondary | ICD-10-CM | POA: Diagnosis not present

## 2014-05-16 DIAGNOSIS — E119 Type 2 diabetes mellitus without complications: Secondary | ICD-10-CM | POA: Diagnosis present

## 2014-05-16 DIAGNOSIS — F339 Major depressive disorder, recurrent, unspecified: Secondary | ICD-10-CM | POA: Diagnosis present

## 2014-05-16 DIAGNOSIS — M1990 Unspecified osteoarthritis, unspecified site: Secondary | ICD-10-CM | POA: Diagnosis present

## 2014-05-16 DIAGNOSIS — Z8249 Family history of ischemic heart disease and other diseases of the circulatory system: Secondary | ICD-10-CM | POA: Diagnosis not present

## 2014-05-16 DIAGNOSIS — Z6834 Body mass index (BMI) 34.0-34.9, adult: Secondary | ICD-10-CM | POA: Diagnosis not present

## 2014-05-16 DIAGNOSIS — R4789 Other speech disturbances: Secondary | ICD-10-CM | POA: Diagnosis present

## 2014-05-16 LAB — CLOSTRIDIUM DIFFICILE BY PCR: Toxigenic C. Difficile by PCR: NEGATIVE

## 2014-05-16 LAB — LIPID PANEL
CHOLESTEROL: 137 mg/dL (ref 0–200)
HDL: 41 mg/dL (ref >39)
LDL Cholesterol: 72 mg/dL (ref 0–99)
Total CHOL/HDL Ratio: 3.3 RATIO
Triglycerides: 120 mg/dL (ref <150)
VLDL: 24 mg/dL (ref 0–40)

## 2014-05-16 LAB — TROPONIN I
Troponin I: <0.3 ng/mL (ref <0.30)
Troponin I: <0.3 ng/mL (ref <0.30)

## 2014-05-16 LAB — GLUCOSE, CAPILLARY
GLUCOSE-CAPILLARY: 113 mg/dL — AB (ref 70–99)
GLUCOSE-CAPILLARY: 147 mg/dL — AB (ref 70–99)
Glucose-Capillary: 113 mg/dL — ABNORMAL HIGH (ref 70–99)
Glucose-Capillary: 120 mg/dL — ABNORMAL HIGH (ref 70–99)

## 2014-05-16 MED ORDER — HEPARIN (PORCINE) IN NACL 100-0.45 UNIT/ML-% IJ SOLN
1000.0000 [IU]/h | INTRAMUSCULAR | Status: DC
  Administered 2014-05-16: 1000 [IU]/h via INTRAVENOUS
  Filled 2014-05-16: qty 250

## 2014-05-16 MED ORDER — SODIUM POLYSTYRENE SULFONATE 15 GM/60ML PO SUSP
30.0000 g | Freq: Once | ORAL | Status: AC
  Administered 2014-05-16: 30 g via RECTAL
  Filled 2014-05-16: qty 120

## 2014-05-16 MED ORDER — FENOFIBRATE 54 MG PO TABS
108.0000 mg | ORAL_TABLET | Freq: Every day | ORAL | Status: DC
  Filled 2014-05-16: qty 2

## 2014-05-16 MED ORDER — SODIUM CHLORIDE 0.9 % IV BOLUS (SEPSIS)
500.0000 mL | Freq: Once | INTRAVENOUS | Status: AC
  Administered 2014-05-16: 500 mL via INTRAVENOUS

## 2014-05-16 MED ORDER — ASPIRIN 325 MG PO TABS
325.0000 mg | ORAL_TABLET | Freq: Once | ORAL | Status: AC
  Administered 2014-05-16: 325 mg via ORAL
  Filled 2014-05-16: qty 1

## 2014-05-16 MED ORDER — HEPARIN SODIUM (PORCINE) 5000 UNIT/ML IJ SOLN
5000.0000 [IU] | Freq: Three times a day (TID) | INTRAMUSCULAR | Status: DC
  Administered 2014-05-16 – 2014-05-19 (×9): 5000 [IU] via SUBCUTANEOUS
  Filled 2014-05-16 (×13): qty 1

## 2014-05-16 MED ORDER — ASPIRIN 300 MG RE SUPP
300.0000 mg | Freq: Once | RECTAL | Status: DC
  Filled 2014-05-16: qty 1

## 2014-05-16 MED ORDER — SODIUM CHLORIDE 0.9 % IV SOLN
INTRAVENOUS | Status: DC
  Administered 2014-05-16: 05:00:00 via INTRAVENOUS

## 2014-05-16 MED ORDER — INSULIN ASPART 100 UNIT/ML ~~LOC~~ SOLN
0.0000 [IU] | Freq: Three times a day (TID) | SUBCUTANEOUS | Status: DC
  Administered 2014-05-17: 2 [IU] via SUBCUTANEOUS
  Administered 2014-05-18 (×2): 1 [IU] via SUBCUTANEOUS
  Administered 2014-05-19: 2 [IU] via SUBCUTANEOUS
  Administered 2014-05-19: 1 [IU] via SUBCUTANEOUS

## 2014-05-16 MED ORDER — CITALOPRAM HYDROBROMIDE 40 MG PO TABS
40.0000 mg | ORAL_TABLET | Freq: Every day | ORAL | Status: DC
  Administered 2014-05-16 – 2014-05-19 (×4): 40 mg via ORAL
  Filled 2014-05-16 (×4): qty 1

## 2014-05-16 MED ORDER — HEPARIN BOLUS VIA INFUSION
4000.0000 [IU] | Freq: Once | INTRAVENOUS | Status: AC
  Administered 2014-05-16: 4000 [IU] via INTRAVENOUS
  Filled 2014-05-16: qty 4000

## 2014-05-16 MED ORDER — SENNOSIDES-DOCUSATE SODIUM 8.6-50 MG PO TABS: 1.0000 | ORAL_TABLET | Freq: Every evening | ORAL | Status: DC | PRN

## 2014-05-16 MED ORDER — STROKE: EARLY STAGES OF RECOVERY BOOK
Freq: Once | Status: AC
  Administered 2014-05-16: 05:00:00
  Filled 2014-05-16: qty 1

## 2014-05-16 MED ORDER — TECHNETIUM TO 99M ALBUMIN AGGREGATED
6.0000 | Freq: Once | INTRAVENOUS | Status: AC | PRN
  Administered 2014-05-16: 6 via INTRAVENOUS

## 2014-05-16 MED ORDER — TECHNETIUM TC 99M DIETHYLENETRIAME-PENTAACETIC ACID: 40.0000 | Freq: Once | INTRAVENOUS | Status: AC | PRN

## 2014-05-16 MED ORDER — DIPHENHYDRAMINE HCL 25 MG PO CAPS: 25.0000 mg | ORAL_CAPSULE | Freq: Four times a day (QID) | ORAL | Status: DC | PRN

## 2014-05-16 MED ORDER — ASPIRIN 325 MG PO TABS
325.0000 mg | ORAL_TABLET | Freq: Every day | ORAL | Status: DC
  Administered 2014-05-17 – 2014-05-18 (×2): 325 mg via ORAL
  Filled 2014-05-16 (×3): qty 1

## 2014-05-16 MED ORDER — ASPIRIN 325 MG PO TABS
325.0000 mg | ORAL_TABLET | Freq: Every day | ORAL | Status: DC
  Filled 2014-05-16: qty 1

## 2014-05-16 NOTE — ED Notes (Signed)
Admitting MD in to assess pt at this time 

## 2014-05-16 NOTE — Progress Notes (Signed)
cdiff stool collected and sent to th lab.

## 2014-05-16 NOTE — Consult Note (Signed)
Referring Physician: Dr. Blaine Hamper    Chief Complaint: transient confusion, language impairment and left face weakness  HPI:                                                                                                                                         Jill [name] is an 80 y.o. female, right handed, with a past medical history significant for HTN, hyperlipidemia, DM type II, CKD, hip replacement 4 days ago, admitted to [facility] for further evaluation and management of the above stated symptoms. Patient is still somewhat confused and said that she has not recollection of experiencing trouble with her language/speech or having face weakness. As per chart review, " she was noticed to be very lethargic, and looked very tired. Her daughter assumed this was possibly due to the anesthesia and the pain medications".Per her daughter-in-law, " patient was noticed to have left facial droop at about 7:00 PM. She also had difficulty in expressing herself. She had difficulty finding the right words to communicate with her daughter-in-law. The patient did not have weakness, numbness or tingling sensations in her extremities. Patient did not have vision or hearing change". Symptoms had resolved at this time. Denies HA, vertigo, double vision, focal weakness or numbness, visual disturbances, CP, SOB, or palpitations. CT brain revealed no acute abnormality. Takes aspirin 81 mg daily. Date last known well: 05/15/14 Time last known well: 7 pm tPA Given: no, symptoms resolved    Past Medical History  Diagnosis Date  . Diabetes mellitus   . Hyperlipidemia   . Chronic kidney disease   . Depression   . Allergy   . Hypertension   . Vasovagal syncope 2006    Negative cardiac workup-myoview, echo  . Diverticula, colon   . Family history of anesthesia complication     Son is difficult intubation  . Complication of anesthesia     difficult waking   . Arthritis     osteoarthritis     Past Surgical History   Procedure Laterality Date  . Abdominal hysterectomy  1977    fibroid  . Joint replacement  2009    rt hip  . Total hip arthroplasty Left 05/12/2014    dr Mayer Camel  . Total hip arthroplasty Left 05/12/2014    Procedure: TOTAL HIP ARTHROPLASTY;  Surgeon: Kerin Salen, MD;  Location: [facility];  Service: Orthopedics;  Laterality: Left;    Family History  Problem Relation Age of Onset  . COPD Brother   . Heart disease Brother   . Diabetes Brother   . Lymphoma Brother   . Alzheimer's disease Brother   . Heart disease Mother   . Pancreatic cancer Sister   . Alzheimer's disease Sister   . Breast cancer Sister   . Leukemia Other    Social History:  reports that she quit smoking about 14 years ago. Her smoking use included Cigarettes.  She has a 45 pack-year smoking history. She has never used smokeless tobacco. She reports that she does not drink alcohol or use illicit drugs.  Allergies:  Allergies  Allergen Reactions  . Lovastatin     REACTION: leg pain  . Niacin   . Statins     REACTION: leg cramps, weakness  . Codeine Rash    Medications:                                                                                                                           I have reviewed the patient's current medications.  ROS:                                                                                                                                       History obtained from chart review and patient  General ROS: negative for - chills, fatigue, fever, night sweats, weight gain or weight loss Psychological ROS: negative for - behavioral disorder, hallucinations, memory difficulties, mood swings or suicidal ideation Ophthalmic ROS: negative for - blurry vision, double vision, eye pain or loss of vision ENT ROS: negative for - epistaxis, nasal discharge, oral lesions, sore throat, tinnitus or vertigo Allergy and Immunology ROS: negative for - hives or itchy/watery eyes Hematological and  Lymphatic ROS: negative for - bleeding problems, bruising or swollen lymph nodes Endocrine ROS: negative for - galactorrhea, hair pattern changes, polydipsia/polyuria or temperature intolerance Respiratory ROS: negative for - cough, hemoptysis, shortness of breath or wheezing Cardiovascular ROS: negative for - chest pain, dyspnea on exertion, edema or irregular heartbeat Gastrointestinal ROS: negative for - abdominal pain, diarrhea, hematemesis, nausea/vomiting or stool incontinence Genito-Urinary ROS: negative for - dysuria, hematuria, incontinence or urinary frequency/urgency Musculoskeletal ROS: negative for - joint swelling or muscular weakness Neurological ROS: as noted in HPI Dermatological ROS: negative for rash and skin lesion changes   Physical exam: pleasant female in no apparent distress.Blood pressure 112/72, pulse 86, temperature 98 F (36.7 C), temperature source Oral, resp. rate 18, height _0  (1.575 m), weight 86.7 kg (191 lb 2.2 oz), SpO2 100.00%. Head: normocephalic. Neck: supple, no bruits, no JVD. Cardiac: no murmurs. Lungs: clear. Abdomen: soft, no tender, no mass. Extremities: no edema. Neurologic Examination:  General: Mental Status: Alert, oriented only to person. Speech fluent without evidence of aphasia.  Able to follow 3 step commands without difficulty. Cranial Nerves: II: Discs flat bilaterally; Visual fields grossly normal, pupils equal, round, reactive to light and accommodation III,IV, VI: ptosis not present, extra-ocular motions intact bilaterally V,VII: smile symmetric, facial light touch sensation normal bilaterally VIII: hearing normal bilaterally IX,X: gag reflex present XI: bilateral shoulder shrug XII: midline tongue extension without atrophy or fasciculations Motor: Moves all extremities symmetrically Tone and bulk:normal tone throughout;  no atrophy noted Sensory: Pinprick and light touch intact throughout, bilaterally Deep Tendon Reflexes:  1+ all over Plantars: Right: downgoing   Left: downgoing Cerebellar: Normal finger to nose. HKS was not tested. Gait:  No tested    Results for orders placed during the hospital encounter of 05/15/14 (from the past 48 hour(s))  CBC     Status: Abnormal   Collection Time    05/15/14 10:40 PM      Result Value Ref Range   WBC 8.5  4.0 - 10.5 K/uL   RBC 3.15 (*) 3.87 - 5.11 MIL/uL   Hemoglobin 9.0 (*) 12.0 - 15.0 g/dL   HCT 27.4 (*) 36.0 - 46.0 %   MCV 87.0  78.0 - 100.0 fL   MCH 28.6  26.0 - 34.0 pg   MCHC 32.8  30.0 - 36.0 g/dL   RDW 13.8  11.5 - 15.5 %   Platelets 308  150 - 400 K/uL  BASIC METABOLIC PANEL     Status: Abnormal   Collection Time    05/15/14 10:40 PM      Result Value Ref Range   Sodium 132 (*) 137 - 147 mEq/L   Potassium 5.5 (*) 3.7 - 5.3 mEq/L   Chloride 95 (*) 96 - 112 mEq/L   CO2 24  19 - 32 mEq/L   Glucose, Bld 196 (*) 70 - 99 mg/dL   BUN 40 (*) 6 - 23 mg/dL   Creatinine, Ser 2.38 (*) 0.50 - 1.10 mg/dL   Calcium 8.6  8.4 - 10.5 mg/dL   GFR calc non Af Amer 18 (*) >90 mL/min   GFR calc Af Amer 21 (*) >90 mL/min   Comment: (NOTE)     The eGFR has been calculated using the CKD EPI equation.     This calculation has not been validated in all clinical situations.     eGFR's persistently <90 mL/min signify possible Chronic Kidney     Disease.   Anion gap 13  5 - 15  I-STAT TROPOININ, ED     Status: None   Collection Time    05/15/14 10:43 PM      Result Value Ref Range   Troponin i, poc 0.02  0.00 - 0.08 ng/mL   Comment 3            Comment: Due to the release kinetics of cTnI,     a negative result within the first hours     of the onset of symptoms does not rule out     myocardial infarction with certainty.     If myocardial infarction is still suspected,     repeat the test at appropriate intervals.  URINALYSIS, ROUTINE W REFLEX  MICROSCOPIC     Status: Abnormal   Collection Time    05/15/14 11:40 PM      Result Value Ref Range   Color, Urine YELLOW  YELLOW   APPearance CLEAR  CLEAR   Specific Gravity,  Urine 1.014  1.005 - 1.030   pH 5.0  5.0 - 8.0   Glucose, UA NEGATIVE  NEGATIVE mg/dL   Hgb urine dipstick NEGATIVE  NEGATIVE   Bilirubin Urine NEGATIVE  NEGATIVE   Ketones, ur NEGATIVE  NEGATIVE mg/dL   Protein, ur NEGATIVE  NEGATIVE mg/dL   Urobilinogen, UA 0.2  0.0 - 1.0 mg/dL   Nitrite NEGATIVE  NEGATIVE   Leukocytes, UA SMALL (*) NEGATIVE  URINE MICROSCOPIC-ADD ON     Status: Abnormal   Collection Time    05/15/14 11:40 PM      Result Value Ref Range   Squamous Epithelial / LPF RARE  RARE   WBC, UA 11-20  <3 WBC/hpf   RBC / HPF 0-2  <3 RBC/hpf   Bacteria, UA RARE  RARE   Casts HYALINE CASTS (*) NEGATIVE   Dg Chest 2 View  05/15/2014   CLINICAL DATA:  Altered mental status and fever for 1 day  EXAM: CHEST  2 VIEW  COMPARISON:  05/08/2014  FINDINGS: The heart size and mediastinal contours are within normal limits. Both lungs are clear. The visualized skeletal structures are unremarkable.  IMPRESSION: No active cardiopulmonary disease.   Electronically Signed   By: Skipper Cliche M.D.   On: 05/15/2014 23:17   Ct Head Wo Contrast  05/16/2014   CLINICAL DATA:  Altered mental status with expressive a aphasia and left facial droop, reportedly resolved.  EXAM: CT HEAD WITHOUT CONTRAST  TECHNIQUE: Contiguous axial images were obtained from the base of the skull through the vertex without intravenous contrast.  COMPARISON:  None.  FINDINGS: Skull and Sinuses:No fracture or destructive process. Left sphenoid sinus effusion which may reflect retained secretions as there is no associated mucosal thickening.  Orbits: Bilateral cataract resection.  Brain: No evidence of acute abnormality, such as acute infarction, hemorrhage, hydrocephalus, or mass lesion/mass effect. There is brain atrophy which has a bifrontal  predominance. Moderate chronic small-vessel disease with patchy ischemic gliosis throughout the bilateral deep cerebral white matter.  IMPRESSION: 1. No acute intracranial findings. 2. Moderate atrophy and chronic small vessel disease. 3. Left sphenoid sinus effusion.   Electronically Signed   By: Jorje Guild M.D.   On: 05/16/2014 03:34   Nm Pulmonary Perf And Vent  05/16/2014   CLINICAL DATA:  Hypoxia 3 days after hip replacement  EXAM: NUCLEAR MEDICINE VENTILATION - PERFUSION LUNG SCAN  TECHNIQUE: Ventilation images were obtained in multiple projections using inhaled aerosol technetium 99 M DTPA. Perfusion images were obtained in multiple projections after intravenous injection of Tc-85mMAA.  RADIOPHARMACEUTICALS:  Forty mCi Tc-926mTPA aerosol and 6 mCi Tc-9966mA  COMPARISON:  None.  FINDINGS: Abnormal lung perfusion with a wedge-shaped area of decreased flow, segmental in shape, at the right peripheral base. Even when accounting for poor lung ventilation due to turbulent flow, the defect is matched in both the anterior and LAO projections. Small area of photopenia at the right base is likely related to the diaphragm (mildly elevated on the right). An apparent posterior right lung photopenic area on posterior profusion is likely hilar. No lung opacity on recent chest x-ray. Single large matched defect is low probability (10-19%) by PIOPED II.  IMPRESSION: 1. Low probability (10-19%) for pulmonary embolism, as above. 2. Turbulent air flow, as seen with COPD.   Electronically Signed   By: JonJorje GuildD.   On: 05/16/2014 03:01     Assessment: [age]o. female with probable right brain TIA. Complete TIA  work up. Aspirin. Stroke team will follow up in am.  Dorian Pod, MD [facility] [phone]  05/16/2014, 5:15 AM

## 2014-05-16 NOTE — Progress Notes (Signed)
05/16/2014 2:52 PM  Pt transfer from [facility] via bed.  Pt fully alert and oriented upon assessment, answers questions appropriately.  Heels reddened but blancheable, skin intact.  Full assessment to EPIC.  No c/o pain.  Pt placed on tele #25.  Vitals stable.  Pt oriented to room/unit, and was instructed on how to utilize the call bell, to which she verbalized understanding.  Will continue to monitor patient. Princella Pellegrini

## 2014-05-16 NOTE — Progress Notes (Signed)
PATIENT ID: Jill [name]  MRN: [id number]  DOB/AGE:  1932-06-29 / 80 y.o.     PROGRESS NOTE Subjective: Pt was admitted last night after an abrupt change in constitutional symptoms including lethargy, confusion, slurred speech and left facial drooping.  Recall, this pt is status post left total hip arthroplasty on 05/12/14 with Dr. Mayer Camel.  Who had progressed through therapy and passed her therapy goals and was discharged home yesterday morning.  Through out the day, she became less responsive and was eventually taken to the ER and admitted for possible TIA.  Unfortunately, there was no family at the bedside this morning.  Patient is confused and not sure why she is back in the hospital, no Nausea, no Vomiting. Denies SOB, Chest or Calf Pain. Using Incentive Spirometer, PAS in place. Ambulate WBAT with therapy assist. Patient reports pain as mild.    Objective: Vital signs in last 24 hours: Filed Vitals:   05/16/14 0145 05/16/14 0335 05/16/14 0630 05/16/14 0742  BP: 135/47 112/72 125/49 133/30  Pulse: 92 86 89 89  Temp:  98 F (36.7 C)  97.6 F (36.4 C)  TempSrc:  Oral  Oral  Resp: 19 18 16 16   Height:  5\' 2"  (1.575 m)    Weight:  86.7 kg (191 lb 2.2 oz)    SpO2: 98% 100% 94% 100%      Intake/Output from previous day: I/O last 3 completed shifts: In: 175 [I.V.:175] Out: -    Intake/Output this shift: Total I/O In: 75 [I.V.:75] Out: -    LABORATORY DATA:  Recent Labs  05/14/14 1708 05/14/14 2130 05/15/14 0616 05/15/14 0625 05/15/14 2240  WBC  --   --   --  8.9 8.5  HGB  --   --   --  8.1* 9.0*  HCT  --   --   --  25.1* 27.4*  PLT  --   --   --  266 308  NA  --   --   --   --  132*  K  --   --   --   --  5.5*  CL  --   --   --   --  95*  CO2  --   --   --   --  24  BUN  --   --   --   --  40*  CREATININE  --   --   --   --  2.38*  GLUCOSE  --   --   --   --  196*  GLUCAP 109* 153* 103*  --   --   CALCIUM  --   --   --   --  8.6    Examination: Neurovascular  intact Sensation intact distally Intact pulses distally Dorsiflexion/Plantar flexion intact Incision: dressing C/D/I No cellulitis present Compartment soft} XR AP&Lat of hip shows well placed\fixed THA  Assessment:   S/P left THA on 05/12/14 New onset TIA - followed by stroke team  ADDITIONAL DIAGNOSIS:  Expected Acute Blood Loss Anemia, Diabetes, Hypertension and Chronic stage III kidney disease.  New onset of symptoms consistent with TIA  Plan: PT/OT WBAT, THA  posterior precautions  DVT Prophylaxis: Continue with recommendations of primary team which is heparin.  Pt is to follow up in clinic in 2 weeks for wound check.  The pt does not need suture or stable removal as the sutures are buried.

## 2014-05-16 NOTE — H&P (Addendum)
[facility] History and Physical  Jill [name] [id number] DOB: [birth year] DOA: 05/15/2014  Referring physician: ED physician PCP: Eliezer Lofts, MD  Specialists:   Chief Complaint: confusion, slurred speech and left facial droopy, SOB.   HPI: Jill [name] is a 80 y.o. female with PMH of hypertension, hyperlipidemia, diabetes type 2, CKD-III, and recent L hip replacement 4 days ago, who present with confusion, slurred speech and left facial droopy.   Patient is confused. The history was obtained from her daughter and daughter-in-law. Her daughter states that she was discharged from hospital 05/14/14 after L hip replacement. After she went home, she was noticed to be very lethargic, and looked very tired. Her daughter assumed this was posttibially due to the anesthesia and the pain medications. Her last dose of oxycodone was in the hospital earlier on 9/23 prior to discharge. Patient also had a mild shortness of breath, but no chest pain, cough, fever or chills. Family called EMS. On their arrival to hospital, her O2 sats were 90% which increased to 98% after giving oxygen. Patient was also noticed to have mild left leg edema, but no pain over the calf areas bilaterally. Of note, patient did not have anticoagulation for DVT prophylaxis at home. Based on this history, Dr. Canary Brim in ED thought pulmonary embolism is the most important differential diagnoses. Because patient has CKD-III, stat CTA cannot be down. VQ scan cannot be done quickly. Therefore, empiric treatment with heparin drip was started in the ED.   When I came to ED to evaluate the patient, her daughter in-law came to the examination room. She told me a different story. Per her daughter-in-law, patient was noticed to have left facial droop at about 7:00 PM. She also had difficulty in expressing herself. She had difficulty finding the right words to communicate with her daughter-in-law. The patient did not have weakness, numbness or  tingling sensations in her extremities. Patient did not have vision or hearing change. The symptoms have completely resolved when I evaluated patient in ED at 2:00 AM (7 hours since her symptoms started). No focal neurological findings from my examination.  Based on this history and my examination on patient, I discontinued heparin after discussion with ED on call. Stat CT scan was ordered. Patient is admitted to step down for further evaluation and treatment. The patient has potassium of 5.5, but without EKG T wave peaking.   Review of Systems: As presented in the history of presenting illness, rest negative.  Where does patient live?  Lives alone before recent admission Can patient participate in ADLs? barely  Allergy:  Allergies  Allergen Reactions  . Lovastatin     REACTION: leg pain  . Niacin   . Statins     REACTION: leg cramps, weakness  . Codeine Rash    Past Medical History  Diagnosis Date  . Diabetes mellitus   . Hyperlipidemia   . Chronic kidney disease   . Depression   . Allergy   . Hypertension   . Vasovagal syncope 2006    Negative cardiac workup-myoview, echo  . Diverticula, colon   . Family history of anesthesia complication     Son is difficult intubation  . Complication of anesthesia     difficult waking   . Arthritis     osteoarthritis     Past Surgical History  Procedure Laterality Date  . Abdominal hysterectomy  1977    fibroid  . Joint replacement  2009    rt hip  .  Total hip arthroplasty Left 05/12/2014    dr Mayer Camel  . Total hip arthroplasty Left 05/12/2014    Procedure: TOTAL HIP ARTHROPLASTY;  Surgeon: Kerin Salen, MD;  Location: [place];  Service: Orthopedics;  Laterality: Left;    Social History:  reports that she quit smoking about 14 years ago. Her smoking use included Cigarettes. She has a 45 pack-year smoking history. She has never used smokeless tobacco. She reports that she does not drink alcohol or use illicit drugs.  Family History:   Family History  Problem Relation Age of Onset  . COPD Brother   . Heart disease Brother   . Diabetes Brother   . Lymphoma Brother   . Alzheimer's disease Brother   . Heart disease Mother   . Pancreatic cancer Sister   . Alzheimer's disease Sister   . Breast cancer Sister   . Leukemia Other      Prior to Admission medications   Medication Sig Start Date End Date Taking? Authorizing Provider  aspirin EC 325 MG tablet Take 1 tablet (325 mg total) by mouth 2 (two) times daily. 05/12/14   Leighton Parody, PA-C  citalopram (CELEXA) 40 MG tablet Take 1 tablet (40 mg total) by mouth daily. 10/31/13   Amy Cletis Athens, MD  Cyanocobalamin (VITAMIN B 12 PO) Take 1 tablet by mouth daily.    Historical Provider, MD  fenofibrate 54 MG tablet Take 108 mg by mouth daily.    Historical Provider, MD  glucose blood (BLOOD GLUCOSE TEST STRIPS) test strip One Touch-Use as directed-2 to 3 times a day     Historical Provider, MD  metFORMIN (GLUCOPHAGE) 500 MG tablet Take 500 mg by mouth daily.     Historical Provider, MD  oxyCODONE-acetaminophen (ROXICET) 5-325 MG per tablet Take 1 tablet by mouth every 4 (four) hours as needed. 05/12/14   Leighton Parody, PA-C  tizanidine (ZANAFLEX) 2 MG capsule Take 1 capsule (2 mg total) by mouth 3 (three) times daily. 05/12/14   Leighton Parody, PA-C    Physical Exam: Filed Vitals:   05/16/14 0100 05/16/14 0115 05/16/14 0130 05/16/14 0145  BP: 121/40 127/50 122/48 135/47  Pulse: 86 89 89 92  Temp:      TempSrc:      Resp:  18 20 19   SpO2: 100% 100% 99% 98%   General: Not in acute distress. Confused and drowsy.  HEENT:       Eyes: PERRL, EOMI, no scleral icterus       ENT: No discharge from the ears and nose, no pharynx injection, no tonsillar enlargement.        Neck: No JVD, no bruit, no mass felt. Cardiac: S1/S2, RRR, No murmurs, gallops or rubs Pulm: Good air movement bilaterally. Clear to auscultation bilaterally. No rales, wheezing, rhonchi or rubs. Abd:  Soft, nondistended, nontender, no rebound pain, no organomegaly, BS present Ext: trace amount of nonpitting edema in left lower leg. 2+DP/PT pulse bilaterally Musculoskeletal: No joint deformities, erythema, or stiffness, ROM full Skin: No rashes.  Neuro: confused, oriented only to person, cranial nerves II-XII grossly intact, muscle strength 5/5 in all extremeties, sensation to light touch intact. Brachial reflex 1+ bilaterally. Knee reflex 1+ bilaterally. Negative Babinski's sign.  finger to nose test could not be performed.  Psych: Patient is not psychotic, no suicidal or hemocidal ideation.  Labs on Admission:  Basic Metabolic Panel:  Recent Labs Lab 05/13/14 0537 05/15/14 2240  NA 135* 132*  K 4.7 5.5*  CL 101 95*  CO2 24 24  GLUCOSE 150* 196*  BUN 25* 40*  CREATININE 1.69* 2.38*  CALCIUM 7.9* 8.6   Liver Function Tests: No results found for this basename: AST, ALT, ALKPHOS, BILITOT, PROT, ALBUMIN,  in the last 168 hours No results found for this basename: LIPASE, AMYLASE,  in the last 168 hours No results found for this basename: AMMONIA,  in the last 168 hours CBC:  Recent Labs Lab 05/13/14 0537 05/14/14 0645 05/15/14 0625 05/15/14 2240  WBC 10.8* 11.7* 8.9 8.5  HGB 9.2* 9.3* 8.1* 9.0*  HCT 28.4* 28.8* 25.1* 27.4*  MCV 88.5 90.9 87.5 87.0  PLT 265 266 266 308   Cardiac Enzymes: No results found for this basename: CKTOTAL, CKMB, CKMBINDEX, TROPONINI,  in the last 168 hours  BNP (last 3 results) No results found for this basename: PROBNP,  in the last 8760 hours CBG:  Recent Labs Lab 05/14/14 0639 05/14/14 1208 05/14/14 1708 05/14/14 2130 05/15/14 0616  GLUCAP 153* 148* 109* 153* 103*    Radiological Exams on Admission: Dg Chest 2 View  05/15/2014   CLINICAL DATA:  Altered mental status and fever for 1 day  EXAM: CHEST  2 VIEW  COMPARISON:  05/08/2014  FINDINGS: The heart size and mediastinal contours are within normal limits. Both lungs are clear. The  visualized skeletal structures are unremarkable.  IMPRESSION: No active cardiopulmonary disease.   Electronically Signed   By: Skipper Cliche M.D.   On: 05/15/2014 23:17    EKG: Independently reviewed.   Assessment/Plan Principal Problem:   TIA (transient ischemic attack) Active Problems:   DIABETES MELLITUS II, UNCOMPLICATED   HYPERLIPIDEMIA   DEPRESSION, MAJOR, RECURRENT   HYPERTENSION, BENIGN SYSTEMIC   CHRONIC KIDNEY DISEASE STAGE III (MODERATE)   Acute encephalopathy   S/P hip replacement   Hyperkalemia  1. TIA: Patient's symptoms are consistent with TIA. Her risk factors including diabetes type 2, hypertension, CKD-III. She does not have signs of infection. No leukocytosis. Temperature is normal. It is out of the window for tPA treatment.  - will admit to SDU  - stat CT-head. If no intracranial bleeding, will give an aspirin 325 mg  - Risk factor modification: fasting lipid panel. A1C was recently done on 05/12/14 which was 7.7 - PT consult, OT consult, Speech consult  - 2 d Echocardiogram  - MRA/MRI-brain - Ekg  - Carotid dopplers  - No statin since she is allergic to it - Neurology was consulted, will see in AM since patient's symptoms resolved completely, and also she is out of the window for tPA treatment.   2. DM-II: A1c was 7.7 on 05/12/14. On metformin at home  -Hold metformin -SSI   3. CKD-III: Baseline creatinine is 1.2-1.9. Her creatinine is 2.38 on admission. It is most likely due to dehydration. - IVF: ns 75cc/h - follow BMP  4. hyperkalemia: Potassium 5.5 on admission. No T wave peaking on EKG. - one dose of Kayexalate 30 g per rectum - follow up BMP in AM  5. HTN: Not on medications. Blood pressure is 121/61 -Monitor blood pressure closely. -Allow permissive hypertension in the setting of possible TIA.  6. L hip replacement: 4 days post the surgery. No acute issue - heparin for DVT ppx - hold pain meds and muscle relaxer in the setting of acute  encephalopathy. Her pain seems to be tolerable.  7.  acute encephalopathy: It is most likely due to #1. Patient does not have chest pain or tachycardia.  Oxygen saturation is normal on 2 L of oxygen. When I evaluated the patient, she did not have shortness of breath. It is very unlikely for patient to have pulmonary embolism. Her left lower leg edema is likely secondary to recent L hip replacement surgery, will need to r/o DVT - will cancel V/Q scan  - LE doppler to r/o DVT  Addendum: At time I was about to cancel the VQ scan, it was already started on the half way, so not cancelled. The results showed Low probability (10-19%) for pulmonary embolism.   DVT ppx: SQ Heparin    Code Status: Full code Family Communication:  Yes, patient's daughter and a daughter-in-law at bed side Disposition Plan: Admit to inpatient  Ivor Costa [facility] Pager [phone]  If 7PM-7AM, please contact night-coverage [email or web address] Password [email or web address] 05/16/2014, 2:42 AM

## 2014-05-16 NOTE — Progress Notes (Signed)
STROKE TEAM PROGRESS NOTE   HISTORY Jill [name] is an 80 y.o. female, right handed, with a past medical history significant for HTN, hyperlipidemia, DM type II, CKD, hip replacement 4 days ago, admitted to [facility] for further evaluation and management of transient confusion, language impairment and left face weakness. Patient is still somewhat confused and said that she has not recollection of experiencing trouble with her language/speech or having face weakness. As per chart review, " she was noticed to be very lethargic, and looked very tired. Her daughter assumed this was possibly due to the anesthesia and the pain medications".Per her daughter-in-law, " patient was noticed to have left facial droop at about 7:00 PM on 05/15/2014. She also had difficulty in expressing herself. She had difficulty finding the right words to communicate with her daughter-in-law. The patient did not have weakness, numbness or tingling sensations in her extremities. Patient did not have vision or hearing change". Symptoms had resolved at this time. Denies HA, vertigo, double vision, focal weakness or numbness, visual disturbances, CP, SOB, or palpitations. CT brain revealed no acute abnormality. Takes aspirin 81 mg daily. Patient was not administered TPA secondary to symptoms resolved. She was admitted to step down for further evaluation and treatment.   SUBJECTIVE (INTERVAL HISTORY) Her  Family  Is not  at the bedside.  Overall she feels her condition is completely resolved.  She denies any prior history of strokes or TIAs.   OBJECTIVE Temp:  [97.6 F (36.4 C)-99.1 F (37.3 C)] 97.6 F (36.4 C) (09/25 0742) Pulse Rate:  [86-93] 89 (09/25 0742) Cardiac Rhythm:  [-] Normal sinus rhythm (09/25 0400) Resp:  [14-20] 16 (09/25 0742) BP: (111-135)/(30-72) 133/30 mmHg (09/25 0742) SpO2:  [89 %-100 %] 100 % (09/25 0742) Weight:  [86.7 kg (191 lb 2.2 oz)] 86.7 kg (191 lb 2.2 oz) (09/25 0335)   Recent Labs Lab  05/14/14 0639 05/14/14 1208 05/14/14 1708 05/14/14 2130 05/15/14 0616  GLUCAP 153* 148* 109* 153* 103*    Recent Labs Lab 05/13/14 0537 05/15/14 2240  NA 135* 132*  K 4.7 5.5*  CL 101 95*  CO2 24 24  GLUCOSE 150* 196*  BUN 25* 40*  CREATININE 1.69* 2.38*  CALCIUM 7.9* 8.6   No results found for this basename: AST, ALT, ALKPHOS, BILITOT, PROT, ALBUMIN,  in the last 168 hours  Recent Labs Lab 05/13/14 0537 05/14/14 0645 05/15/14 0625 05/15/14 2240  WBC 10.8* 11.7* 8.9 8.5  HGB 9.2* 9.3* 8.1* 9.0*  HCT 28.4* 28.8* 25.1* 27.4*  MCV 88.5 90.9 87.5 87.0  PLT 265 266 266 308    Recent Labs Lab 05/16/14 0430  TROPONINI <0.30   No results found for this basename: LABPROT, INR,  in the last 72 hours  Recent Labs  05/15/14 2340  COLORURINE YELLOW  LABSPEC 1.014  PHURINE 5.0  GLUCOSEU NEGATIVE  HGBUR NEGATIVE  BILIRUBINUR NEGATIVE  KETONESUR NEGATIVE  PROTEINUR NEGATIVE  UROBILINOGEN 0.2  NITRITE NEGATIVE  LEUKOCYTESUR SMALL*       Component Value Date/Time   CHOL 137 05/16/2014 0430   TRIG 120 05/16/2014 0430   HDL 41 05/16/2014 0430   CHOLHDL 3.3 05/16/2014 0430   VLDL 24 05/16/2014 0430   LDLCALC 72 05/16/2014 0430   Lab Results  Component Value Date   HGBA1C 7.7* 05/12/2014   No results found for this basename: labopia, cocainscrnur, labbenz, amphetmu, thcu, labbarb    No results found for this basename: ETH,  in the last 168 hours  Dg Chest 2 View  05/15/2014   CLINICAL DATA:  Altered mental status and fever for 1 day  EXAM: CHEST  2 VIEW  COMPARISON:  05/08/2014  FINDINGS: The heart size and mediastinal contours are within normal limits. Both lungs are clear. The visualized skeletal structures are unremarkable.  IMPRESSION: No active cardiopulmonary disease.   Electronically Signed   By: Skipper Cliche M.D.   On: 05/15/2014 23:17   Ct Head Wo Contrast  05/16/2014   CLINICAL DATA:  Altered mental status with expressive a aphasia and left facial  droop, reportedly resolved.  EXAM: CT HEAD WITHOUT CONTRAST  TECHNIQUE: Contiguous axial images were obtained from the base of the skull through the vertex without intravenous contrast.  COMPARISON:  None.  FINDINGS: Skull and Sinuses:No fracture or destructive process. Left sphenoid sinus effusion which may reflect retained secretions as there is no associated mucosal thickening.  Orbits: Bilateral cataract resection.  Brain: No evidence of acute abnormality, such as acute infarction, hemorrhage, hydrocephalus, or mass lesion/mass effect. There is brain atrophy which has a bifrontal predominance. Moderate chronic small-vessel disease with patchy ischemic gliosis throughout the bilateral deep cerebral white matter.  IMPRESSION: 1. No acute intracranial findings. 2. Moderate atrophy and chronic small vessel disease. 3. Left sphenoid sinus effusion.   Electronically Signed   By: Jorje Guild M.D.   On: 05/16/2014 03:34   Nm Pulmonary Perf And Vent  05/16/2014   CLINICAL DATA:  Hypoxia 3 days after hip replacement  EXAM: NUCLEAR MEDICINE VENTILATION - PERFUSION LUNG SCAN  TECHNIQUE: Ventilation images were obtained in multiple projections using inhaled aerosol technetium 99 M DTPA. Perfusion images were obtained in multiple projections after intravenous injection of Tc-15m MAA.  RADIOPHARMACEUTICALS:  Forty mCi Tc-76m DTPA aerosol and 6 mCi Tc-66m MAA  COMPARISON:  None.  FINDINGS: Abnormal lung perfusion with a wedge-shaped area of decreased flow, segmental in shape, at the right peripheral base. Even when accounting for poor lung ventilation due to turbulent flow, the defect is matched in both the anterior and LAO projections. Small area of photopenia at the right base is likely related to the diaphragm (mildly elevated on the right). An apparent posterior right lung photopenic area on posterior profusion is likely hilar. No lung opacity on recent chest x-ray. Single large matched defect is low probability  (10-19%) by PIOPED II.  IMPRESSION: 1. Low probability (10-19%) for pulmonary embolism, as above. 2. Turbulent air flow, as seen with COPD.   Electronically Signed   By: Jorje Guild M.D.   On: 05/16/2014 03:01    PHYSICAL EXAM Pleasant elderly lady not in distress.Awake alert. Afebrile. Head is nontraumatic. Neck is supple without bruit. Hearing is normal. Cardiac exam no murmur or gallop. Lungs are clear to auscultation. Distal pulses are well felt. Neurological Exam :  Awake  Alert oriented x 3. Normal speech and language.eye movements full without nystagmus.fundi were not visualized. Vision acuity and fields appear normal. Hearing is normal. Palatal movements are normal. Face symmetric. Tongue midline. Normal strength, tone, reflexes and coordination. Normal sensation. Gait deferred.  ASSESSMENT/PLAN  Jill [name] is a 80 y.o. female with history of HTN, hyperlipidemia, DM type II, CKD, hip replacement 4 days prior to admission presenting with transient confusion, language impairment and left face weakness. She did not receive IV t-PA due to symptom resolution. Initial CT imaging unremarkable infarct.   Stroke vs TIA:  right brain       aspirin 325 mg orally every day  prior to admission, now on aspirin 300 mg suppository  MRI  pending  MRA  pending  Carotid Dopppes pending   LLE venous duplex - pending  Heparin 5000 units sq tid for VTE prophylaxis  NPO    Bedrest  No resultant neuro symptoms  Therapy recommendations:  HHPT, HHOT  Ongoing aggressive risk factor management  Risk factor education  Patient counseled to be compliant with her antithrombotic medications  Disposition:  pending  Hypertension   Permissive hypertension <220/120 for 24-48 hours and then gradually normalize within 5-7 days  BP goal long term normotensive  Not on home meds BP 111-135/30-47 past 24h (05/16/2014 @ 8:30 AM)  Stable  Patient counseled to be compliant with her blood  pressure medications  Hyperlipidemia  Home meds:  Fenofibrate 108 mg daily. H/o Statin allergies.   LDL 72   LDL goal < 100 (<70 for diabetics)   continue statin at discharge  Diabetes  HgbA1c 7.7, Goal < 7.0  Uncontrolled  Home metformin on hold  Other Stroke Risk Factors Advanced age Former smoker    Obesity, Body mass index is 34.95 kg/(m^2).   Other Active Problems  CKD stage III, baseline creatinint 1.2-1.9. Elevated on admission due to dehydration.  Left total hip arthroscopy, WBAT, HH PT, RN, CPM, walker & 3N1  Hyperkalemia 5.5 on admission, got kayexalate  Other Pertinent History     Hospital day # 1   I have personally examined this patient, reviewed notes, independently viewed imaging studies, participated in medical decision making and plan of care. I have made any additions or clarifications directly to the above note. Agree with note above.    Antony Contras, MD Medical Director [facility] Pager: [phone] 05/16/2014 5:21 PM     To contact Stroke Continuity provider, please refer to [email or web address] After hours, contact General Neurology

## 2014-05-16 NOTE — Progress Notes (Signed)
Speech Language Pathology   Patient Details Name: Jill [name] MRN: [id number] DOB: 04-17-1932 Today's Date: 05/16/2014 Time:  -      MD, orders are for speech-language-cognitive assessment only dated tomorrow.  Pt. Is NPO and should have had RN swallow screen due to diagnosis.  Does pt. Need RN screen initially and ST if needed for swallow? Please advise. Thanks   Orbie Pyo Amardeep Beckers M.Ed [employer] [phone]

## 2014-05-16 NOTE — Progress Notes (Signed)
Patient admitted after midnight. Chart reviewed. Patient examined. Discussed with Dr. Leonie Man. Neurologically, back to baseline. RN swallow screen pending. Workup underway. Transfer out of SDU.  Continue PT, OT. Home if workup negative.  Doree Barthel, MD [facility] [phone]

## 2014-05-16 NOTE — Progress Notes (Signed)
Utilization review completed. Arlicia Paquette, RN, BSN. 

## 2014-05-17 DIAGNOSIS — N183 Chronic kidney disease, stage 3 unspecified: Secondary | ICD-10-CM

## 2014-05-17 DIAGNOSIS — I059 Rheumatic mitral valve disease, unspecified: Secondary | ICD-10-CM

## 2014-05-17 DIAGNOSIS — D649 Anemia, unspecified: Secondary | ICD-10-CM

## 2014-05-17 LAB — GLUCOSE, CAPILLARY
GLUCOSE-CAPILLARY: 109 mg/dL — AB (ref 70–99)
GLUCOSE-CAPILLARY: 97 mg/dL (ref 70–99)
GLUCOSE-CAPILLARY: 189 mg/dL — AB (ref 70–99)

## 2014-05-17 LAB — BLOOD GAS, ARTERIAL
Acid-base deficit: 0.1 mmol/L (ref 0.0–2.0)
BICARBONATE: 23.9 meq/L (ref 20.0–24.0)
Drawn by: 225631
FIO2: 0.21 %
O2 Saturation: 94.7 %
PH ART: 7.417 (ref 7.350–7.450)
PO2 ART: 73.4 mmHg — AB (ref 80.0–100.0)
Patient temperature: 98.6
TCO2: 25.1 mmol/L (ref 0–100)
pCO2 arterial: 37.8 mmHg (ref 35.0–45.0)

## 2014-05-17 LAB — CBC
HCT: 24.7 % — ABNORMAL LOW (ref 36.0–46.0)
Hemoglobin: 8.2 g/dL — ABNORMAL LOW (ref 12.0–15.0)
MCH: 28.7 pg (ref 26.0–34.0)
MCHC: 33.2 g/dL (ref 30.0–36.0)
MCV: 86.4 fL (ref 78.0–100.0)
PLATELETS: 334 10*3/uL (ref 150–400)
RBC: 2.86 MIL/uL — AB (ref 3.87–5.11)
RDW: 13.6 % (ref 11.5–15.5)
WBC: 5.3 10*3/uL (ref 4.0–10.5)

## 2014-05-17 LAB — COMPREHENSIVE METABOLIC PANEL
ALBUMIN: 2.3 g/dL — AB (ref 3.5–5.2)
ALT: 10 U/L (ref 0–35)
AST: 28 U/L (ref 0–37)
Alkaline Phosphatase: 48 U/L (ref 39–117)
Anion gap: 13 (ref 5–15)
BUN: 27 mg/dL — ABNORMAL HIGH (ref 6–23)
CALCIUM: 8.6 mg/dL (ref 8.4–10.5)
CO2: 24 meq/L (ref 19–32)
CREATININE: 1.36 mg/dL — AB (ref 0.50–1.10)
Chloride: 103 mEq/L (ref 96–112)
GFR calc Af Amer: 41 mL/min — ABNORMAL LOW (ref >90)
GFR, EST NON AFRICAN AMERICAN: 36 mL/min — AB (ref >90)
Glucose, Bld: 96 mg/dL (ref 70–99)
Potassium: 4 mEq/L (ref 3.7–5.3)
SODIUM: 140 meq/L (ref 137–147)
Total Bilirubin: 0.3 mg/dL (ref 0.3–1.2)
Total Protein: 5.8 g/dL — ABNORMAL LOW (ref 6.0–8.3)

## 2014-05-17 LAB — VITAMIN B12: Vitamin B-12: 1507 pg/mL — ABNORMAL HIGH (ref 211–911)

## 2014-05-17 LAB — TSH: TSH: 1.13 u[IU]/mL (ref 0.350–4.500)

## 2014-05-17 MED ORDER — DEXTROSE 5 % IV SOLN
1.0000 g | INTRAVENOUS | Status: DC
  Administered 2014-05-17 – 2014-05-19 (×3): 1 g via INTRAVENOUS
  Filled 2014-05-17 (×4): qty 10

## 2014-05-17 MED ORDER — ACETAMINOPHEN 325 MG PO TABS
650.0000 mg | ORAL_TABLET | Freq: Once | ORAL | Status: AC
  Administered 2014-05-17: 650 mg via ORAL
  Filled 2014-05-17: qty 2

## 2014-05-17 NOTE — Progress Notes (Signed)
  Echocardiogram 2D Echocardiogram has been performed.  Jill [name] 05/17/2014, 10:12 AM

## 2014-05-17 NOTE — Progress Notes (Signed)
[facility] PROGRESS NOTE  Jill [name] [id number] DOB: 1932-03-31 DOA: 05/15/2014 PCP: Eliezer Lofts, MD  Assessment/Plan: Principal Problem:   TIA (transient ischemic attack) Active Problems:   DIABETES MELLITUS II, UNCOMPLICATED   HYPERLIPIDEMIA   DEPRESSION, MAJOR, RECURRENT   HYPERTENSION, BENIGN SYSTEMIC   CHRONIC KIDNEY DISEASE STAGE III (MODERATE)   Acute encephalopathy   S/P hip replacement   Hyperkalemia     Possible TIA MRI of the brain is negative Moderate chronic small vessel ischemic disease and cerebral  atrophy 2-D echo done results pending PT/OT-will need home health Carotid Doppler pending ABG within normal limits Mild urinary tract infection Chest x-ray negative-ruled out for infection   Status post TOTAL HIP ARTHROPLASTY (Left ASA 325 mg BID x 2 weeks HHPT, HHRN, CPM, Walker and 3-in-1 comode seat  Pt is to follow up in clinic in 2 weeks for wound check. The pt does not need suture or stable removal as the sutures are buried   acute blood loss anemia Will recheck hemoglobin  UTI Start the patient on Rocephin  Diabetes Holding metformin, start the patient on SSI  Dyslipidemia Patient on fenofibrate  Code Status: full Family Communication: family updated about patient's clinical progress Disposition Plan:  As above    Brief narrative: Jill [name] is an 80 y.o. female, right handed, with a past medical history significant for HTN, hyperlipidemia, DM type II, CKD, hip replacement 4 days ago, admitted to [facility] for further evaluation and management of the above stated symptoms.  Patient is still somewhat confused and said that she has not recollection of experiencing trouble with her language/speech or having face weakness.  As per chart review, " she was noticed to be very lethargic, and looked very tired. Her daughter assumed this was possibly due to the anesthesia and the pain medications".Per her daughter-in-law, " patient was  noticed to have left facial droop at about 7:00 PM. She also had difficulty in expressing herself. She had difficulty finding the right words to communicate with her daughter-in-law. The patient did not have weakness, numbness or tingling sensations in her extremities. Patient did not have vision or hearing change".  Symptoms had resolved at this time.  Denies HA, vertigo, double vision, focal weakness or numbness, visual disturbances, CP, SOB, or palpitations.  CT brain revealed no acute abnormality. . Recall, this pt is status post left total hip arthroplasty on 05/12/14 with Dr. Mayer Camel. Who had progressed through therapy and passed her therapy goals and was discharged home yesterday morning. Through out the day, she became less responsive and was eventually taken to the ER and admitted for possible TIA. Unfortunately, there was no family at the bedside this morning.      Consultants:  NEUROLOGY  Procedures:  NONE   Antibiotics:  STARTED ROCPHINE   HPI/Subjective: ALERT answering questions appropriately, family concerned she is not being appropriate   Objective: Filed Vitals:   05/16/14 1754 05/16/14 2103 05/17/14 0444 05/17/14 0950  BP:  125/58 133/55 153/70  Pulse:  95 93 98  Temp: 98.8 F (37.1 C) 98.7 F (37.1 C) 98.3 F (36.8 C) 98.2 F (36.8 C)  TempSrc:  Oral Oral Oral  Resp:  18 18 18   Height:      Weight:  86.699 kg (191 lb 2.2 oz)    SpO2:  95% 95% 96%   No intake or output data in the 24 hours ending 05/17/14 1202  Exam:  General: alert & oriented x 3 In NAD  Cardiovascular: RRR, nl S1 s2  Respiratory: Decreased breath sounds at the bases, scattered rhonchi, no crackles  Abdomen: soft +BS NT/ND, no masses palpable  Extremities: No cyanosis and no edema      Data Reviewed: Basic Metabolic Panel:  Recent Labs Lab 05/13/14 0537 05/15/14 2240  NA 135* 132*  K 4.7 5.5*  CL 101 95*  CO2 24 24  GLUCOSE 150* 196*  BUN 25* 40*  CREATININE 1.69*  2.38*  CALCIUM 7.9* 8.6    Liver Function Tests: No results found for this basename: AST, ALT, ALKPHOS, BILITOT, PROT, ALBUMIN,  in the last 168 hours No results found for this basename: LIPASE, AMYLASE,  in the last 168 hours No results found for this basename: AMMONIA,  in the last 168 hours  CBC:  Recent Labs Lab 05/13/14 0537 05/14/14 0645 05/15/14 0625 05/15/14 2240  WBC 10.8* 11.7* 8.9 8.5  HGB 9.2* 9.3* 8.1* 9.0*  HCT 28.4* 28.8* 25.1* 27.4*  MCV 88.5 90.9 87.5 87.0  PLT 265 266 266 308    Cardiac Enzymes:  Recent Labs Lab 05/16/14 0430 05/16/14 0950  TROPONINI <0.30 <0.30   BNP (last 3 results) No results found for this basename: PROBNP,  in the last 8760 hours   CBG:  Recent Labs Lab 05/16/14 0739 05/16/14 1241 05/16/14 1751 05/16/14 2108 05/17/14 0810  GLUCAP 113* 113* 120* 147* 109*    Recent Results (from the past 240 hour(s))  SURGICAL PCR SCREEN     Status: None   Collection Time    05/08/14  2:18 PM      Result Value Ref Range Status   MRSA, PCR NEGATIVE  NEGATIVE Final   Staphylococcus aureus NEGATIVE  NEGATIVE Final   Comment:            The Xpert SA Assay (FDA     approved for NASAL specimens     in patients over 79 years of age),     is one component of     a comprehensive surveillance     program.  Test performance has     been validated by [employer] for patients greater     than or equal to 55 year old.     It is not intended     to diagnose infection nor to     guide or monitor treatment.  CLOSTRIDIUM DIFFICILE BY PCR     Status: None   Collection Time    05/16/14  1:46 PM      Result Value Ref Range Status   C difficile by pcr NEGATIVE  NEGATIVE Final     Studies: Dg Chest 2 View  05/15/2014   CLINICAL DATA:  Altered mental status and fever for 1 day  EXAM: CHEST  2 VIEW  COMPARISON:  05/08/2014  FINDINGS: The heart size and mediastinal contours are within normal limits. Both lungs are clear. The visualized  skeletal structures are unremarkable.  IMPRESSION: No active cardiopulmonary disease.   Electronically Signed   By: Skipper Cliche M.D.   On: 05/15/2014 23:17   Dg Chest 2 View  05/08/2014   CLINICAL DATA:  Short of breath  EXAM: CHEST  2 VIEW  COMPARISON:  01/20/2009  FINDINGS: Heart size appears normal. No pleural effusion or edema. No airspace consolidation identified. Mild peribronchial thickening is again noted and appears similar to previous exam.  IMPRESSION: 1. No acute findings. 2. Chronic bronchitic changes noted   Electronically Signed  By: Kerby Moors M.D.   On: 05/08/2014 15:57   Ct Head Wo Contrast  05/16/2014   CLINICAL DATA:  Altered mental status with expressive a aphasia and left facial droop, reportedly resolved.  EXAM: CT HEAD WITHOUT CONTRAST  TECHNIQUE: Contiguous axial images were obtained from the base of the skull through the vertex without intravenous contrast.  COMPARISON:  None.  FINDINGS: Skull and Sinuses:No fracture or destructive process. Left sphenoid sinus effusion which may reflect retained secretions as there is no associated mucosal thickening.  Orbits: Bilateral cataract resection.  Brain: No evidence of acute abnormality, such as acute infarction, hemorrhage, hydrocephalus, or mass lesion/mass effect. There is brain atrophy which has a bifrontal predominance. Moderate chronic small-vessel disease with patchy ischemic gliosis throughout the bilateral deep cerebral white matter.  IMPRESSION: 1. No acute intracranial findings. 2. Moderate atrophy and chronic small vessel disease. 3. Left sphenoid sinus effusion.   Electronically Signed   By: Jorje Guild M.D.   On: 05/16/2014 03:34   Mr Jodene Nam Head Wo Contrast  05/16/2014   CLINICAL DATA:  Confusion, slurred speech, and left facial droop. TIA.  EXAM: MRI HEAD WITHOUT CONTRAST  MRA HEAD WITHOUT CONTRAST  TECHNIQUE: Multiplanar, multiecho pulse sequences of the brain and surrounding structures were obtained without  intravenous contrast. Angiographic images of the head were obtained using MRA technique without contrast.  COMPARISON:  Head CT 05/16/2014  FINDINGS: MRI HEAD FINDINGS  Images are mildly degraded by motion artifact. There is no evidence of acute infarct, intracranial hemorrhage, mass, midline shift, or extra-axial fluid collection. There is moderate cerebral atrophy with mild frontal lobe predominant. Patchy and confluent T2 hyperintensities in the subcortical and deep cerebral white matter are nonspecific but compatible with moderate chronic small vessel ischemic disease.  Prior bilateral cataract extraction is noted. Mastoid air cells are clear. Small amount of fluid is again seen in the left sphenoid sinus. Major intracranial vascular flow voids are preserved.  MRA HEAD FINDINGS  Images are mildly degraded by motion artifact. Visualized distal vertebral arteries are patent with the left being mildly dominant. PICA origins are patent. SCA origins are grossly patent but are small in caliber and not well evaluated. Basilar artery is patent without stenosis. Posterior communicating arteries are not identified. P1 segments are patent without stenosis. Mild right and moderate left PCA branch vessel irregular narrowing is present.  Internal carotid arteries are patent from skullbase to carotid termini without stenosis. ACAs are unremarkable. Proximal MCAs are unremarkable without stenosis. There is mild-to-moderate bilateral MCA branch vessel irregularity. No intracranial aneurysm is identified.  IMPRESSION: 1. No acute intracranial abnormality. 2. Moderate chronic small vessel ischemic disease and cerebral atrophy. 3. No major intracranial arterial occlusion or significant proximal stenosis. Mild-to-moderate branch vessel irregularity involving the anterior and posterior circulations likely reflecting atherosclerosis and accentuated by motion artifact.   Electronically Signed   By: Logan Bores   On: 05/16/2014 15:21    Mr Brain Wo Contrast  05/16/2014   CLINICAL DATA:  Confusion, slurred speech, and left facial droop. TIA.  EXAM: MRI HEAD WITHOUT CONTRAST  MRA HEAD WITHOUT CONTRAST  TECHNIQUE: Multiplanar, multiecho pulse sequences of the brain and surrounding structures were obtained without intravenous contrast. Angiographic images of the head were obtained using MRA technique without contrast.  COMPARISON:  Head CT 05/16/2014  FINDINGS: MRI HEAD FINDINGS  Images are mildly degraded by motion artifact. There is no evidence of acute infarct, intracranial hemorrhage, mass, midline shift, or extra-axial fluid collection. There  is moderate cerebral atrophy with mild frontal lobe predominant. Patchy and confluent T2 hyperintensities in the subcortical and deep cerebral white matter are nonspecific but compatible with moderate chronic small vessel ischemic disease.  Prior bilateral cataract extraction is noted. Mastoid air cells are clear. Small amount of fluid is again seen in the left sphenoid sinus. Major intracranial vascular flow voids are preserved.  MRA HEAD FINDINGS  Images are mildly degraded by motion artifact. Visualized distal vertebral arteries are patent with the left being mildly dominant. PICA origins are patent. SCA origins are grossly patent but are small in caliber and not well evaluated. Basilar artery is patent without stenosis. Posterior communicating arteries are not identified. P1 segments are patent without stenosis. Mild right and moderate left PCA branch vessel irregular narrowing is present.  Internal carotid arteries are patent from skullbase to carotid termini without stenosis. ACAs are unremarkable. Proximal MCAs are unremarkable without stenosis. There is mild-to-moderate bilateral MCA branch vessel irregularity. No intracranial aneurysm is identified.  IMPRESSION: 1. No acute intracranial abnormality. 2. Moderate chronic small vessel ischemic disease and cerebral atrophy. 3. No major intracranial  arterial occlusion or significant proximal stenosis. Mild-to-moderate branch vessel irregularity involving the anterior and posterior circulations likely reflecting atherosclerosis and accentuated by motion artifact.   Electronically Signed   By: Logan Bores   On: 05/16/2014 15:21   Dg Pelvis Portable  05/12/2014   CLINICAL DATA:  Postop left hip replacement  EXAM: PORTABLE PELVIS 1-2 VIEWS  COMPARISON:  Radiograph 01/23/2009  FINDINGS: Interval left total hip arthroplasty. The femoral component and acetabular component well seated. No evidence fracture. Right hip arthroplasty again noted.  IMPRESSION: No complication following left hip arthroplasty.   Electronically Signed   By: Suzy Bouchard M.D.   On: 05/12/2014 12:57   Nm Pulmonary Perf And Vent  05/16/2014   CLINICAL DATA:  Hypoxia 3 days after hip replacement  EXAM: NUCLEAR MEDICINE VENTILATION - PERFUSION LUNG SCAN  TECHNIQUE: Ventilation images were obtained in multiple projections using inhaled aerosol technetium 99 M DTPA. Perfusion images were obtained in multiple projections after intravenous injection of Tc-14m MAA.  RADIOPHARMACEUTICALS:  Forty mCi Tc-74m DTPA aerosol and 6 mCi Tc-61m MAA  COMPARISON:  None.  FINDINGS: Abnormal lung perfusion with a wedge-shaped area of decreased flow, segmental in shape, at the right peripheral base. Even when accounting for poor lung ventilation due to turbulent flow, the defect is matched in both the anterior and LAO projections. Small area of photopenia at the right base is likely related to the diaphragm (mildly elevated on the right). An apparent posterior right lung photopenic area on posterior profusion is likely hilar. No lung opacity on recent chest x-ray. Single large matched defect is low probability (10-19%) by PIOPED II.  IMPRESSION: 1. Low probability (10-19%) for pulmonary embolism, as above. 2. Turbulent air flow, as seen with COPD.   Electronically Signed   By: Jorje Guild M.D.   On:  05/16/2014 03:01    Scheduled Meds: . aspirin  300 mg Rectal Once  . aspirin  325 mg Oral Daily  . citalopram  40 mg Oral Daily  . heparin  5,000 Units Subcutaneous 3 times per day  . insulin aspart  0-9 Units Subcutaneous TID WC   Continuous Infusions: . sodium chloride 75 mL/hr at 05/16/14 0440    Principal Problem:   TIA (transient ischemic attack) Active Problems:   DIABETES MELLITUS II, UNCOMPLICATED   HYPERLIPIDEMIA   DEPRESSION, MAJOR, RECURRENT   HYPERTENSION,  BENIGN SYSTEMIC   CHRONIC KIDNEY DISEASE STAGE III (MODERATE)   Acute encephalopathy   S/P hip replacement   Hyperkalemia    Time spent: 40 minutes   [facility] Pager [phone]. If 7PM-7AM, please contact night-coverage at [email or web address], password [facility] 05/17/2014, 12:02 PM  LOS: 2 days

## 2014-05-17 NOTE — Progress Notes (Signed)
STROKE TEAM PROGRESS NOTE   HISTORY Jill [name] is an 80 y.o. female, right handed, with a past medical history significant for HTN, hyperlipidemia, DM type II, CKD, hip replacement 4 days ago, admitted to [facility] for further evaluation and management of transient confusion, language impairment and left face weakness. Patient is still somewhat confused and said that she has not recollection of experiencing trouble with her language/speech or having face weakness. As per chart review, " she was noticed to be very lethargic, and looked very tired. Her daughter assumed this was possibly due to the anesthesia and the pain medications".Per her daughter-in-law, " patient was noticed to have left facial droop at about 7:00 PM on 05/15/2014. She also had difficulty in expressing herself. She had difficulty finding the right words to communicate with her daughter-in-law. The patient did not have weakness, numbness or tingling sensations in her extremities. Patient did not have vision or hearing change". Symptoms had resolved at this time. Denies HA, vertigo, double vision, focal weakness or numbness, visual disturbances, CP, SOB, or palpitations. CT brain revealed no acute abnormality. Takes aspirin 81 mg daily. Patient was not administered TPA secondary to symptoms resolved. She was admitted to step down for further evaluation and treatment.   SUBJECTIVE (INTERVAL HISTORY) Episodic confusion last night and this AM. Better this afternoon. Family at bedside. Dr Merlene Laughter lengthy discussion with family.    OBJECTIVE Temp:  [98.2 F (36.8 C)-98.8 F (37.1 C)] 98.2 F (36.8 C) (09/26 0950) Pulse Rate:  [89-98] 98 (09/26 0950) Cardiac Rhythm:  [-] Normal sinus rhythm (09/25 2030) Resp:  [16-18] 18 (09/26 0950) BP: (125-153)/(44-70) 153/70 mmHg (09/26 0950) SpO2:  [95 %-96 %] 96 % (09/26 0950) Weight:  [191 lb 2.2 oz (86.699 kg)] 191 lb 2.2 oz (86.699 kg) (09/25 2103)   Recent Labs Lab 05/16/14 1241  05/16/14 1751 05/16/14 2108 05/17/14 0810 05/17/14 1147  GLUCAP 113* 120* 147* 109* 97    Recent Labs Lab 05/13/14 0537 05/15/14 2240 05/17/14 1220  NA 135* 132* 140  K 4.7 5.5* 4.0  CL 101 95* 103  CO2 24 24 24   GLUCOSE 150* 196* 96  BUN 25* 40* 27*  CREATININE 1.69* 2.38* 1.36*  CALCIUM 7.9* 8.6 8.6    Recent Labs Lab 05/17/14 1220  AST 28  ALT 10  ALKPHOS 48  BILITOT 0.3  PROT 5.8*  ALBUMIN 2.3*    Recent Labs Lab 05/13/14 0537 05/14/14 0645 05/15/14 0625 05/15/14 2240 05/17/14 1220  WBC 10.8* 11.7* 8.9 8.5 5.3  HGB 9.2* 9.3* 8.1* 9.0* 8.2*  HCT 28.4* 28.8* 25.1* 27.4* 24.7*  MCV 88.5 90.9 87.5 87.0 86.4  PLT 265 266 266 308 334    Recent Labs Lab 05/16/14 0430 05/16/14 0950  TROPONINI <0.30 <0.30   No results found for this basename: LABPROT, INR,  in the last 72 hours  Recent Labs  05/15/14 2340  COLORURINE YELLOW  LABSPEC 1.014  PHURINE 5.0  GLUCOSEU NEGATIVE  HGBUR NEGATIVE  BILIRUBINUR NEGATIVE  KETONESUR NEGATIVE  PROTEINUR NEGATIVE  UROBILINOGEN 0.2  NITRITE NEGATIVE  LEUKOCYTESUR SMALL*       Component Value Date/Time   CHOL 137 05/16/2014 0430   TRIG 120 05/16/2014 0430   HDL 41 05/16/2014 0430   CHOLHDL 3.3 05/16/2014 0430   VLDL 24 05/16/2014 0430   LDLCALC 72 05/16/2014 0430   Lab Results  Component Value Date   HGBA1C 7.7* 05/12/2014   No results found for this basename: labopia,  cocainscrnur,  labbenz,  amphetmu,  thcu,  labbarb    No results found for this basename: ETH,  in the last 168 hours  Dg Chest 2 View 05/15/2014    No active cardiopulmonary disease.     Ct Head Wo Contrast   05/16/2014    1. No acute intracranial findings.  2. Moderate atrophy and chronic small vessel disease.  3. Left sphenoid sinus effusion.      MRI / Mra Head Wo Contrast 05/16/2014    1. No acute intracranial abnormality.  2. Moderate chronic small vessel ischemic disease and cerebral atrophy.  3. No major intracranial  arterial occlusion or significant proximal stenosis. Mild-to-moderate branch vessel irregularity involving the anterior and posterior circulations likely reflecting atherosclerosis and accentuated by motion artifact.       Nm Pulmonary Perf And Vent 05/16/2014    1. Low probability (10-19%) for pulmonary embolism, as above.  2. Turbulent air flow, as seen with COPD.       PHYSICAL EXAM Pleasant elderly lady not in distress.Awake alert. Afebrile. Head is nontraumatic. Neck is supple without bruit. Hearing is normal. Cardiac exam no murmur or gallop. Lungs are clear to auscultation. Distal pulses are well felt. Neurological Exam :  Awake  Alert oriented x 3. Normal speech and language.eye movements full without nystagmus. Vision fields normal. Hearing is normal. Palatal movements are normal. Face symmetric. Tongue midline. Normal strength, tone, reflexes and coordination. Normal sensation. Gait deferred.  ASSESSMENT/PLAN  [name] is a 80 y.o. female with history of HTN, hyperlipidemia, DM type II, CKD, hip replacement 4 days prior to admission presenting with transient confusion, language impairment and left face weakness. She did not receive IV t-PA due to symptom resolution. Initial CT imaging unremarkable infarct.   TIA:  right brain       aspirin 325 mg orally every day prior to admission, now on aspirin 300 mg suppository  MRI  as above  MRA  as above  Carotid Dopppes - pending   LLE venous duplex - pending  2-D echo - pending  Heparin 5000 units sq tid for VTE prophylaxis  Cardiac    Bedrest  No resultant neuro symptoms  Therapy recommendations:  HHPT, HHOT  Ongoing aggressive risk factor management  Risk factor education  Patient counseled to be compliant with her antithrombotic medications  Disposition:  pending  Hypertension   Permissive hypertension <220/120 for 24-48 hours and then gradually normalize within 5-7 days  BP goal long term  normotensive  Not on home meds BP 111-135/30-47 past 24h (05/17/2014 @ 2:20 PM)  Stable  Patient counseled to be compliant with her blood pressure medications  Hyperlipidemia  Home meds:  Fenofibrate 108 mg daily. H/o Statin allergies.   LDL 72   LDL goal < 100 (<70 for diabetics)  Continue fenofibrate at discharge  Diabetes  HgbA1c 7.7, Goal < 7.0  Uncontrolled  Home metformin on hold  Other Stroke Risk Factors Advanced age Former smoker    Obesity, Body mass index is 34.95 kg/(m^2).   Other Active Problems  CKD stage III, baseline creatinint 1.2-1.9. Elevated on admission due to dehydration.  Left total hip arthroscopy, WBAT, HH PT, RN, CPM, walker & 3N1  Hyperkalemia 5.5 on admission, got kayexalate  Other Pertinent History Episodic confusion S/P surgery medication effect versus TIA versus SZ.????-- get EEG    Hospital day # [place] PA-C [facility] Pager [phone] 05/17/2014, 2:20 PM  To contact Stroke Continuity provider, please refer to [email or web address] After hours, contact General Neurology

## 2014-05-17 NOTE — Evaluation (Signed)
Physical Therapy Evaluation Patient Details Name: Jill [name] MRN: [id number] DOB: 07/03/1932 Today's Date: 05/17/2014   History of Present Illness  80 y.o. female s/p left total hip arthroplasty on 05/12/14. Upon return home family noted stroke-like symptoms and EMS was called. Upon arrival at ED symptoms had resolved. PMH significant for syncope, HTN, chest pain, DM and chronic kidney disease.  Clinical Impression  Pt admitted with the above. Pt currently with functional limitations due to the deficits listed below (see PT Problem List). At the time of PT eval pt was able to perform transfers and ambulation with min guard. Pt will benefit from skilled PT to increase their independence and safety with mobility to allow discharge to the venue listed below.      Follow Up Recommendations Home health PT;Supervision for mobility/OOB    Equipment Recommendations  None recommended by PT    Recommendations for Other Services       Precautions / Restrictions Precautions Precautions: Posterior Hip;Fall Precaution Comments: Reviewed precautions Restrictions Weight Bearing Restrictions: Yes LLE Weight Bearing: Weight bearing as tolerated      Mobility  Bed Mobility Overal bed mobility: Needs Assistance Bed Mobility: Supine to Sit     Supine to sit: Supervision     General bed mobility comments: VC's for sequencing and technique to maintain hip precautions.   Transfers Overall transfer level: Needs assistance Equipment used: Rolling walker (2 wheeled) Transfers: Sit to/from Stand Sit to Stand: Min guard         General transfer comment: VC's for hand placement on seated surface for safety.   Ambulation/Gait Ambulation/Gait assistance: Min guard Ambulation Distance (Feet): 75 Feet Assistive device: Rolling walker (2 wheeled) Gait Pattern/deviations: Step-through pattern;Decreased stride length;Narrow base of support Gait velocity: Decreased Gait velocity interpretation:  Below normal speed for age/gender General Gait Details: VC's for improved posture, and technique during turns to maintain hip precautions.   Stairs            Wheelchair Mobility    Modified Rankin (Stroke Patients Only)       Balance Overall balance assessment: Needs assistance Sitting-balance support: Feet supported;No upper extremity supported Sitting balance-Leahy Scale: Good     Standing balance support: Bilateral upper extremity supported;During functional activity Standing balance-Leahy Scale: Poor Standing balance comment: Feel pt requires UE support to maintain standing balance at this time.                              Pertinent Vitals/Pain Pain Assessment: No/denies pain    Home Living Family/patient expects to be discharged to:: Private residence Living Arrangements: Alone Available Help at Discharge: Family;Available 24 hours/day Type of Home: House Home Access: Ramped entrance     Home Layout: One level Home Equipment: Walker - 2 wheels;Bedside commode;Grab bars - toilet;Grab bars - tub/shower;Shower seat;Wheelchair - manual      Prior Function Level of Independence: Needs assistance   Gait / Transfers Assistance Needed: Required assist from family members as well as with the walker for ambulation.   ADL's / Homemaking Assistance Needed: Requires assist for bathing/dressing        Hand Dominance   Dominant Hand: Right    Extremity/Trunk Assessment   Upper Extremity Assessment: Defer to OT evaluation           Lower Extremity Assessment: LLE deficits/detail   LLE Deficits / Details: L THA on 05/12/14  Cervical / Trunk Assessment: Normal  Communication  Communication: HOH  Cognition Arousal/Alertness: Awake/alert Behavior During Therapy: WFL for tasks assessed/performed Overall Cognitive Status: Within Functional Limits for tasks assessed                      General Comments      Exercises Total Joint  Exercises Ankle Circles/Pumps: 20 reps Quad Sets: 10 reps Hip ABduction/ADduction: 10 reps Long Arc Quad: 10 reps      Assessment/Plan    PT Assessment Patient needs continued PT services  PT Diagnosis Difficulty walking;Abnormality of gait   PT Problem List Decreased strength;Decreased range of motion;Decreased activity tolerance;Decreased balance;Decreased mobility;Decreased knowledge of use of DME;Decreased safety awareness;Decreased knowledge of precautions  PT Treatment Interventions DME instruction;Gait training;Therapeutic activities;Therapeutic exercise;Neuromuscular re-education;Patient/family education;Stair training;Functional mobility training   PT Goals (Current goals can be found in the Care Plan section) Acute Rehab PT Goals Patient Stated Goal: not stated PT Goal Formulation: With patient Time For Goal Achievement: 05/24/14 Potential to Achieve Goals: Good    Frequency Min 3X/week   Barriers to discharge        Co-evaluation               End of Session Equipment Utilized During Treatment: Gait belt Activity Tolerance: Patient tolerated treatment well Patient left: in chair;with call bell/phone within reach;with family/visitor present Nurse Communication: Mobility status         Time: [id number] PT Time Calculation (min): 28 min   Charges:   PT Evaluation $Initial PT Evaluation Tier I: 1 Procedure PT Treatments $Gait Training: 8-22 mins $Therapeutic Exercise: 8-22 mins   PT G Codes:          Jolyn Lent 05/17/2014, 5:53 PM  Jolyn Lent, PT, DPT Acute Rehabilitation Services Pager: [phone]

## 2014-05-18 DIAGNOSIS — M7989 Other specified soft tissue disorders: Secondary | ICD-10-CM

## 2014-05-18 DIAGNOSIS — M79609 Pain in unspecified limb: Secondary | ICD-10-CM

## 2014-05-18 LAB — COMPREHENSIVE METABOLIC PANEL
ALT: 12 U/L (ref 0–35)
AST: 28 U/L (ref 0–37)
Albumin: 2.3 g/dL — ABNORMAL LOW (ref 3.5–5.2)
Alkaline Phosphatase: 47 U/L (ref 39–117)
Anion gap: 12 (ref 5–15)
BUN: 25 mg/dL — ABNORMAL HIGH (ref 6–23)
CALCIUM: 8.5 mg/dL (ref 8.4–10.5)
CO2: 27 mEq/L (ref 19–32)
Chloride: 103 mEq/L (ref 96–112)
Creatinine, Ser: 1.28 mg/dL — ABNORMAL HIGH (ref 0.50–1.10)
GFR, EST AFRICAN AMERICAN: 45 mL/min — AB (ref >90)
GFR, EST NON AFRICAN AMERICAN: 38 mL/min — AB (ref >90)
Glucose, Bld: 117 mg/dL — ABNORMAL HIGH (ref 70–99)
Potassium: 3.8 mEq/L (ref 3.7–5.3)
SODIUM: 142 meq/L (ref 137–147)
TOTAL PROTEIN: 5.6 g/dL — AB (ref 6.0–8.3)
Total Bilirubin: 0.3 mg/dL (ref 0.3–1.2)

## 2014-05-18 LAB — CBC
HCT: 26 % — ABNORMAL LOW (ref 36.0–46.0)
Hemoglobin: 8.7 g/dL — ABNORMAL LOW (ref 12.0–15.0)
MCH: 28.9 pg (ref 26.0–34.0)
MCHC: 33.5 g/dL (ref 30.0–36.0)
MCV: 86.4 fL (ref 78.0–100.0)
PLATELETS: 364 10*3/uL (ref 150–400)
RBC: 3.01 MIL/uL — ABNORMAL LOW (ref 3.87–5.11)
RDW: 13.8 % (ref 11.5–15.5)
WBC: 6.5 10*3/uL (ref 4.0–10.5)

## 2014-05-18 LAB — GLUCOSE, CAPILLARY
GLUCOSE-CAPILLARY: 53 mg/dL — AB (ref 70–99)
GLUCOSE-CAPILLARY: 123 mg/dL — AB (ref 70–99)
Glucose-Capillary: 192 mg/dL — ABNORMAL HIGH (ref 70–99)
Glucose-Capillary: 70 mg/dL (ref 70–99)
Glucose-Capillary: 138 mg/dL — ABNORMAL HIGH (ref 70–99)

## 2014-05-18 MED ORDER — ACETAMINOPHEN 325 MG PO TABS
650.0000 mg | ORAL_TABLET | Freq: Once | ORAL | Status: AC
  Administered 2014-05-18: 650 mg via ORAL
  Filled 2014-05-18: qty 2

## 2014-05-18 NOTE — Progress Notes (Addendum)
[facility] PROGRESS NOTE  Jill [name] [id number] DOB: Feb 12, 1932 DOA: 05/15/2014 PCP: Eliezer Lofts, MD  Assessment/Plan: Principal Problem:   TIA (transient ischemic attack) Active Problems:   DIABETES MELLITUS II, UNCOMPLICATED   HYPERLIPIDEMIA   DEPRESSION, MAJOR, RECURRENT   HYPERTENSION, BENIGN SYSTEMIC   CHRONIC KIDNEY DISEASE STAGE III (MODERATE)   Acute encephalopathy   S/P hip replacement   Hyperkalemia   Possible TIA  MRI of the brain is negative  Moderate chronic small vessel ischemic disease and cerebral  atrophy  2-D echo done r EF of 55-60%, mild mitral regurgitation PT/OT-will need home health  Carotid Doppler pending  ABG within normal limits  Mild urinary tract infection started treatment 9/26 Chest x-ray negative-ruled out for infection  EEG pending Venous Doppler negative Carotid Doppler negative Continue aspirin  CKD stage III, baseline creatinint 1.2-1.9. Elevated on admission due to dehydration, improved  Status post TOTAL HIP ARTHROPLASTY (Left  ASA 325 mg BID x 2 weeks  HHPT, HHRN, CPM, Walker and 3-in-1 comode seat  Pt is to follow up in clinic in 2 weeks for wound check. The pt does not need suture or stable removal as the sutures are buried   acute blood loss anemia  Hemoglobin stable  UTI  Start the patient on Rocephin   Diabetes  Holding metformin, start the patient on SSI   Dyslipidemia  Patient on fenofibrate   Hyperkalemia 5.5 on admission, got kayexalate, resolved  Code Status: full  Family Communication: family updated about patient's clinical progress  Disposition Plan: Possible discharge tomorrow with home health physical therapy  Brief narrative:  Jill [name] is an 80 y.o. female, right handed, with a past medical history significant for HTN, hyperlipidemia, DM type II, CKD, hip replacement 4 days ago, admitted to [facility] for further evaluation and management of the above stated symptoms.  Patient is still  somewhat confused and said that she has not recollection of experiencing trouble with her language/speech or having face weakness.  As per chart review, " she was noticed to be very lethargic, and looked very tired. Her daughter assumed this was possibly due to the anesthesia and the pain medications".Per her daughter-in-law, " patient was noticed to have left facial droop at about 7:00 PM. She also had difficulty in expressing herself. She had difficulty finding the right words to communicate with her daughter-in-law. The patient did not have weakness, numbness or tingling sensations in her extremities. Patient did not have vision or hearing change".  Symptoms had resolved at this time.  Denies HA, vertigo, double vision, focal weakness or numbness, visual disturbances, CP, SOB, or palpitations.  CT brain revealed no acute abnormality.  . Recall, this pt is status post left total hip arthroplasty on 05/12/14 with Dr. Mayer Camel. Who had progressed through therapy and passed her therapy goals and was discharged home yesterday morning. Through out the day, she became less responsive and was eventually taken to the ER and admitted for possible TIA. Unfortunately, there was no family at the bedside this morning.    Consultants:  NEUROLOGY Procedures:  NONE  Antibiotics:  STARTED ROCPHINE  HPI/Subjective:  ALERT answering questions appropriately, family concerned she is not being appropriate   Objective: Filed Vitals:   05/17/14 1827 05/17/14 2047 05/18/14 0555 05/18/14 0954  BP: 142/49 128/82 134/63 130/80  Pulse: 86 83 84 79  Temp: 98.4 F (36.9 C) 97.9 F (36.6 C) 98 F (36.7 C) 98 F (36.7 C)  TempSrc: Oral Oral Oral Oral  Resp: 18 18 18 18   Height:  5\' 2"  (1.575 m)    Weight:  84.324 kg (185 lb 14.4 oz)    SpO2: 96% 97% 97% 100%    Intake/Output Summary (Last 24 hours) at 05/18/14 1045 Last data filed at 05/18/14 0900  Gross per 24 hour  Intake    480 ml  Output      0 ml  Net     480 ml    Exam:  General: alert & oriented x 3 In NAD  Cardiovascular: RRR, nl S1 s2  Respiratory: Decreased breath sounds at the bases, scattered rhonchi, no crackles  Abdomen: soft +BS NT/ND, no masses palpable  Extremities: No cyanosis and no edema      Data Reviewed: Basic Metabolic Panel:  Recent Labs Lab 05/13/14 0537 05/15/14 2240 05/17/14 1220 05/18/14 0600  NA 135* 132* 140 142  K 4.7 5.5* 4.0 3.8  CL 101 95* 103 103  CO2 24 24 24 27   GLUCOSE 150* 196* 96 117*  BUN 25* 40* 27* 25*  CREATININE 1.69* 2.38* 1.36* 1.28*  CALCIUM 7.9* 8.6 8.6 8.5    Liver Function Tests:  Recent Labs Lab 05/17/14 1220 05/18/14 0600  AST 28 28  ALT 10 12  ALKPHOS 48 47  BILITOT 0.3 0.3  PROT 5.8* 5.6*  ALBUMIN 2.3* 2.3*   No results found for this basename: LIPASE, AMYLASE,  in the last 168 hours No results found for this basename: AMMONIA,  in the last 168 hours  CBC:  Recent Labs Lab 05/14/14 0645 05/15/14 0625 05/15/14 2240 05/17/14 1220 05/18/14 0600  WBC 11.7* 8.9 8.5 5.3 6.5  HGB 9.3* 8.1* 9.0* 8.2* 8.7*  HCT 28.8* 25.1* 27.4* 24.7* 26.0*  MCV 90.9 87.5 87.0 86.4 86.4  PLT 266 266 308 334 364    Cardiac Enzymes:  Recent Labs Lab 05/16/14 0430 05/16/14 0950  TROPONINI <0.30 <0.30   BNP (last 3 results) No results found for this basename: PROBNP,  in the last 8760 hours   CBG:  Recent Labs Lab 05/17/14 1634 05/17/14 2043 05/17/14 2101 05/18/14 0015 05/18/14 0730  GLUCAP 189* 53* 70 192* 123*    Recent Results (from the past 240 hour(s))  SURGICAL PCR SCREEN     Status: None   Collection Time    05/08/14  2:18 PM      Result Value Ref Range Status   MRSA, PCR NEGATIVE  NEGATIVE Final   Staphylococcus aureus NEGATIVE  NEGATIVE Final   Comment:            The Xpert SA Assay (FDA     approved for NASAL specimens     in patients over 78 years of age),     is one component of     a comprehensive surveillance     program.  Test  performance has     been validated by [employer] for patients greater     than or equal to 58 year old.     It is not intended     to diagnose infection nor to     guide or monitor treatment.  CLOSTRIDIUM DIFFICILE BY PCR     Status: None   Collection Time    05/16/14  1:46 PM      Result Value Ref Range Status   C difficile by pcr NEGATIVE  NEGATIVE Final     Studies: Dg Chest 2 View  05/15/2014   CLINICAL DATA:  Altered mental status and fever for 1 day  EXAM: CHEST  2 VIEW  COMPARISON:  05/08/2014  FINDINGS: The heart size and mediastinal contours are within normal limits. Both lungs are clear. The visualized skeletal structures are unremarkable.  IMPRESSION: No active cardiopulmonary disease.   Electronically Signed   By: Skipper Cliche M.D.   On: 05/15/2014 23:17   Dg Chest 2 View  05/08/2014   CLINICAL DATA:  Short of breath  EXAM: CHEST  2 VIEW  COMPARISON:  01/20/2009  FINDINGS: Heart size appears normal. No pleural effusion or edema. No airspace consolidation identified. Mild peribronchial thickening is again noted and appears similar to previous exam.  IMPRESSION: 1. No acute findings. 2. Chronic bronchitic changes noted   Electronically Signed   By: Kerby Moors M.D.   On: 05/08/2014 15:57   Ct Head Wo Contrast  05/16/2014   CLINICAL DATA:  Altered mental status with expressive a aphasia and left facial droop, reportedly resolved.  EXAM: CT HEAD WITHOUT CONTRAST  TECHNIQUE: Contiguous axial images were obtained from the base of the skull through the vertex without intravenous contrast.  COMPARISON:  None.  FINDINGS: Skull and Sinuses:No fracture or destructive process. Left sphenoid sinus effusion which may reflect retained secretions as there is no associated mucosal thickening.  Orbits: Bilateral cataract resection.  Brain: No evidence of acute abnormality, such as acute infarction, hemorrhage, hydrocephalus, or mass lesion/mass effect. There is brain atrophy which has a  bifrontal predominance. Moderate chronic small-vessel disease with patchy ischemic gliosis throughout the bilateral deep cerebral white matter.  IMPRESSION: 1. No acute intracranial findings. 2. Moderate atrophy and chronic small vessel disease. 3. Left sphenoid sinus effusion.   Electronically Signed   By: Jorje Guild M.D.   On: 05/16/2014 03:34   Mr Jodene Nam Head Wo Contrast  05/16/2014   CLINICAL DATA:  Confusion, slurred speech, and left facial droop. TIA.  EXAM: MRI HEAD WITHOUT CONTRAST  MRA HEAD WITHOUT CONTRAST  TECHNIQUE: Multiplanar, multiecho pulse sequences of the brain and surrounding structures were obtained without intravenous contrast. Angiographic images of the head were obtained using MRA technique without contrast.  COMPARISON:  Head CT 05/16/2014  FINDINGS: MRI HEAD FINDINGS  Images are mildly degraded by motion artifact. There is no evidence of acute infarct, intracranial hemorrhage, mass, midline shift, or extra-axial fluid collection. There is moderate cerebral atrophy with mild frontal lobe predominant. Patchy and confluent T2 hyperintensities in the subcortical and deep cerebral white matter are nonspecific but compatible with moderate chronic small vessel ischemic disease.  Prior bilateral cataract extraction is noted. Mastoid air cells are clear. Small amount of fluid is again seen in the left sphenoid sinus. Major intracranial vascular flow voids are preserved.  MRA HEAD FINDINGS  Images are mildly degraded by motion artifact. Visualized distal vertebral arteries are patent with the left being mildly dominant. PICA origins are patent. SCA origins are grossly patent but are small in caliber and not well evaluated. Basilar artery is patent without stenosis. Posterior communicating arteries are not identified. P1 segments are patent without stenosis. Mild right and moderate left PCA branch vessel irregular narrowing is present.  Internal carotid arteries are patent from skullbase to  carotid termini without stenosis. ACAs are unremarkable. Proximal MCAs are unremarkable without stenosis. There is mild-to-moderate bilateral MCA branch vessel irregularity. No intracranial aneurysm is identified.  IMPRESSION: 1. No acute intracranial abnormality. 2. Moderate chronic small vessel ischemic disease and cerebral atrophy. 3. No major intracranial arterial occlusion or significant  proximal stenosis. Mild-to-moderate branch vessel irregularity involving the anterior and posterior circulations likely reflecting atherosclerosis and accentuated by motion artifact.   Electronically Signed   By: Logan Bores   On: 05/16/2014 15:21   Mr Brain Wo Contrast  05/16/2014   CLINICAL DATA:  Confusion, slurred speech, and left facial droop. TIA.  EXAM: MRI HEAD WITHOUT CONTRAST  MRA HEAD WITHOUT CONTRAST  TECHNIQUE: Multiplanar, multiecho pulse sequences of the brain and surrounding structures were obtained without intravenous contrast. Angiographic images of the head were obtained using MRA technique without contrast.  COMPARISON:  Head CT 05/16/2014  FINDINGS: MRI HEAD FINDINGS  Images are mildly degraded by motion artifact. There is no evidence of acute infarct, intracranial hemorrhage, mass, midline shift, or extra-axial fluid collection. There is moderate cerebral atrophy with mild frontal lobe predominant. Patchy and confluent T2 hyperintensities in the subcortical and deep cerebral white matter are nonspecific but compatible with moderate chronic small vessel ischemic disease.  Prior bilateral cataract extraction is noted. Mastoid air cells are clear. Small amount of fluid is again seen in the left sphenoid sinus. Major intracranial vascular flow voids are preserved.  MRA HEAD FINDINGS  Images are mildly degraded by motion artifact. Visualized distal vertebral arteries are patent with the left being mildly dominant. PICA origins are patent. SCA origins are grossly patent but are small in caliber and not well  evaluated. Basilar artery is patent without stenosis. Posterior communicating arteries are not identified. P1 segments are patent without stenosis. Mild right and moderate left PCA branch vessel irregular narrowing is present.  Internal carotid arteries are patent from skullbase to carotid termini without stenosis. ACAs are unremarkable. Proximal MCAs are unremarkable without stenosis. There is mild-to-moderate bilateral MCA branch vessel irregularity. No intracranial aneurysm is identified.  IMPRESSION: 1. No acute intracranial abnormality. 2. Moderate chronic small vessel ischemic disease and cerebral atrophy. 3. No major intracranial arterial occlusion or significant proximal stenosis. Mild-to-moderate branch vessel irregularity involving the anterior and posterior circulations likely reflecting atherosclerosis and accentuated by motion artifact.   Electronically Signed   By: Logan Bores   On: 05/16/2014 15:21   Dg Pelvis Portable  05/12/2014   CLINICAL DATA:  Postop left hip replacement  EXAM: PORTABLE PELVIS 1-2 VIEWS  COMPARISON:  Radiograph 01/23/2009  FINDINGS: Interval left total hip arthroplasty. The femoral component and acetabular component well seated. No evidence fracture. Right hip arthroplasty again noted.  IMPRESSION: No complication following left hip arthroplasty.   Electronically Signed   By: Suzy Bouchard M.D.   On: 05/12/2014 12:57   Nm Pulmonary Perf And Vent  05/16/2014   CLINICAL DATA:  Hypoxia 3 days after hip replacement  EXAM: NUCLEAR MEDICINE VENTILATION - PERFUSION LUNG SCAN  TECHNIQUE: Ventilation images were obtained in multiple projections using inhaled aerosol technetium 99 M DTPA. Perfusion images were obtained in multiple projections after intravenous injection of Tc-53m MAA.  RADIOPHARMACEUTICALS:  Forty mCi Tc-63m DTPA aerosol and 6 mCi Tc-29m MAA  COMPARISON:  None.  FINDINGS: Abnormal lung perfusion with a wedge-shaped area of decreased flow, segmental in shape, at  the right peripheral base. Even when accounting for poor lung ventilation due to turbulent flow, the defect is matched in both the anterior and LAO projections. Small area of photopenia at the right base is likely related to the diaphragm (mildly elevated on the right). An apparent posterior right lung photopenic area on posterior profusion is likely hilar. No lung opacity on recent chest x-ray. Single large matched defect is  low probability (10-19%) by PIOPED II.  IMPRESSION: 1. Low probability (10-19%) for pulmonary embolism, as above. 2. Turbulent air flow, as seen with COPD.   Electronically Signed   By: Jorje Guild M.D.   On: 05/16/2014 03:01    Scheduled Meds: . aspirin  300 mg Rectal Once  . aspirin  325 mg Oral Daily  . cefTRIAXone (ROCEPHIN)  IV  1 g Intravenous Q24H  . citalopram  40 mg Oral Daily  . heparin  5,000 Units Subcutaneous 3 times per day  . insulin aspart  0-9 Units Subcutaneous TID WC   Continuous Infusions: . sodium chloride 75 mL/hr at 05/16/14 0440    Principal Problem:   TIA (transient ischemic attack) Active Problems:   DIABETES MELLITUS II, UNCOMPLICATED   HYPERLIPIDEMIA   DEPRESSION, MAJOR, RECURRENT   HYPERTENSION, BENIGN SYSTEMIC   CHRONIC KIDNEY DISEASE STAGE III (MODERATE)   Acute encephalopathy   S/P hip replacement   Hyperkalemia    Time spent: 40 minutes   [facility] Pager [phone]. If 7PM-7AM, please contact night-coverage at [email or web address], password [facility] 05/18/2014, 10:45 AM  LOS: 3 days

## 2014-05-18 NOTE — Progress Notes (Signed)
STROKE TEAM PROGRESS NOTE   HISTORY Jill [name] is an 80 y.o. female, right handed, with a past medical history significant for HTN, hyperlipidemia, DM type II, CKD, hip replacement 4 days ago, admitted to [facility] for further evaluation and management of transient confusion, language impairment and left face weakness. Patient is still somewhat confused and said that she has not recollection of experiencing trouble with her language/speech or having face weakness. As per chart review, " she was noticed to be very lethargic, and looked very tired. Her daughter assumed this was possibly due to the anesthesia and the pain medications".Per her daughter-in-law, " patient was noticed to have left facial droop at about 7:00 PM on 05/15/2014. She also had difficulty in expressing herself. She had difficulty finding the right words to communicate with her daughter-in-law. The patient did not have weakness, numbness or tingling sensations in her extremities. Patient did not have vision or hearing change". Symptoms had resolved at this time. Denies HA, vertigo, double vision, focal weakness or numbness, visual disturbances, CP, SOB, or palpitations. CT brain revealed no acute abnormality. Takes aspirin 81 mg daily. Patient was not administered TPA secondary to symptoms resolved. She was admitted to step down for further evaluation and treatment.   SUBJECTIVE (INTERVAL HISTORY) Doing well. No complaints. Family at bedside. Dr Merlene Laughter updated family.    OBJECTIVE Temp:  [97.9 F (36.6 C)-98.4 F (36.9 C)] 98 F (36.7 C) (09/27 0555) Pulse Rate:  [83-98] 84 (09/27 0555) Cardiac Rhythm:  [-] Normal sinus rhythm (09/26 2116) Resp:  [18] 18 (09/27 0555) BP: (128-153)/(49-82) 134/63 mmHg (09/27 0555) SpO2:  [96 %-97 %] 97 % (09/27 0555) Weight:  [185 lb 14.4 oz (84.324 kg)] 185 lb 14.4 oz (84.324 kg) (09/26 2047)   Recent Labs Lab 05/17/14 1147 05/17/14 1634 05/17/14 2043 05/17/14 2101 05/18/14 0015   GLUCAP 97 189* 53* 70 192*    Recent Labs Lab 05/13/14 0537 05/15/14 2240 05/17/14 1220 05/18/14 0600  NA 135* 132* 140 142  K 4.7 5.5* 4.0 3.8  CL 101 95* 103 103  CO2 24 24 24 27   GLUCOSE 150* 196* 96 117*  BUN 25* 40* 27* 25*  CREATININE 1.69* 2.38* 1.36* 1.28*  CALCIUM 7.9* 8.6 8.6 8.5    Recent Labs Lab 05/17/14 1220 05/18/14 0600  AST 28 28  ALT 10 12  ALKPHOS 48 47  BILITOT 0.3 0.3  PROT 5.8* 5.6*  ALBUMIN 2.3* 2.3*    Recent Labs Lab 05/14/14 0645 05/15/14 0625 05/15/14 2240 05/17/14 1220 05/18/14 0600  WBC 11.7* 8.9 8.5 5.3 6.5  HGB 9.3* 8.1* 9.0* 8.2* 8.7*  HCT 28.8* 25.1* 27.4* 24.7* 26.0*  MCV 90.9 87.5 87.0 86.4 86.4  PLT 266 266 308 334 364    Recent Labs Lab 05/16/14 0430 05/16/14 0950  TROPONINI <0.30 <0.30   No results found for this basename: LABPROT, INR,  in the last 72 hours  Recent Labs  05/15/14 2340  COLORURINE YELLOW  LABSPEC 1.014  PHURINE 5.0  GLUCOSEU NEGATIVE  HGBUR NEGATIVE  BILIRUBINUR NEGATIVE  KETONESUR NEGATIVE  PROTEINUR NEGATIVE  UROBILINOGEN 0.2  NITRITE NEGATIVE  LEUKOCYTESUR SMALL*       Component Value Date/Time   CHOL 137 05/16/2014 0430   TRIG 120 05/16/2014 0430   HDL 41 05/16/2014 0430   CHOLHDL 3.3 05/16/2014 0430   VLDL 24 05/16/2014 0430   LDLCALC 72 05/16/2014 0430   Lab Results  Component Value Date   HGBA1C 7.7* 05/12/2014  No results found for this basename: labopia,  cocainscrnur,  labbenz,  amphetmu,  thcu,  labbarb    No results found for this basename: ETH,  in the last 168 hours  Dg Chest 2 View 05/15/2014    No active cardiopulmonary disease.     Ct Head Wo Contrast   05/16/2014    1. No acute intracranial findings.  2. Moderate atrophy and chronic small vessel disease.  3. Left sphenoid sinus effusion.      MRI / Mra Head Wo Contrast 05/16/2014    1. No acute intracranial abnormality.  2. Moderate chronic small vessel ischemic disease and cerebral atrophy.  3. No  major intracranial arterial occlusion or significant proximal stenosis. Mild-to-moderate branch vessel irregularity involving the anterior and posterior circulations likely reflecting atherosclerosis and accentuated by motion artifact.       Nm Pulmonary Perf And Vent 05/16/2014    1. Low probability (10-19%) for pulmonary embolism, as above.  2. Turbulent air flow, as seen with COPD.      EEG - Pending    PHYSICAL EXAM Pleasant elderly lady not in distress.Awake alert. Afebrile. Head is nontraumatic. Neck is supple without bruit. Hearing is normal. Cardiac exam no murmur or gallop. Lungs are clear to auscultation. Distal pulses are well felt. Neurological Exam :  Awake  Alert oriented x 3. Normal speech and language.eye movements full without nystagmus. Vision fields normal. Hearing is normal. Palatal movements are normal. Face symmetric. Tongue midline. Normal strength, tone, reflexes and coordination. Normal sensation. Gait deferred.  ASSESSMENT/PLAN  Jill [name] is a 80 y.o. female with history of HTN, hyperlipidemia, DM type II, CKD, hip replacement 4 days prior to admission presenting with transient confusion, language impairment and left face weakness. She did not receive IV t-PA due to symptom resolution. Initial CT imaging unremarkable infarct.   TIA:  right brain       aspirin 325 mg orally every day prior to admission, now on aspirin 300 mg suppository  MRI - No acute intracranial abnormality.   MRA - No major intracranial arterial occlusion   Carotid Dopppes - Preliminary report: 1-39% ICA stenosis. Vertebral artery flow is antegrade.   LLE venous duplex - Preliminary report: There is no DVT or SVT noted in the left lower extremity.  2-D echo - ejection fraction 55-60%. No cardiac source of emboli identified.  Heparin 5000 units sq tid for VTE prophylaxis  Activity - up with assistance - WBAT  No resultant neuro symptoms  Therapy recommendations:  HHPT,  HHOT  Ongoing aggressive risk factor management  Risk factor education  Patient counseled to be compliant with her antithrombotic medications  Disposition:  pending  Hypertension   Permissive hypertension <220/120 for 24-48 hours and then gradually normalize within 5-7 days  BP goal long term normotensive  Not on home meds BP 111-135/30-47 past 24h (05/18/2014 @ 8:15 AM)  Stable  Patient counseled to be compliant with her blood pressure medications  Hyperlipidemia  Home meds:  Fenofibrate 108 mg daily. H/o Statin allergies.   LDL 72   LDL goal < 100 (<70 for diabetics)  Continue fenofibrate at discharge  Diabetes  HgbA1c 7.7, Goal < 7.0  Uncontrolled  Home metformin on hold  Other Stroke Risk Factors Advanced age Former smoker    Obesity, Body mass index is 33.99 kg/(m^2).   Other Active Problems  CKD stage III, baseline creatinint 1.2-1.9. Elevated on admission due to dehydration.  Left total hip arthroscopy, 05/12/2014 WBAT,  HH PT, RN, CPM, walker & 3N1  Hyperkalemia 5.5 on admission, got kayexalate - 3.8 on 05/18/2014   Anemia - H/H 8.7 / 26  Hypoxia - VQ scan 05/16/2014 low probability for pulmonary embolism  UTI - IV Rocephin day number 2  Other Pertinent History  Episodic confusion S/P surgery medication effect versus TIA versus SZ.????-- get EEG    Hospital day # [place] PA-C [facility] Pager [phone] 05/18/2014, 8:15 AM       To contact Stroke Continuity provider, please refer to [email or web address] After hours, contact General Neurology

## 2014-05-18 NOTE — Progress Notes (Signed)
VASCULAR LAB PRELIMINARY  PRELIMINARY  PRELIMINARY  PRELIMINARY  Left lower extremity venous Dopplers completed.    Preliminary report:  There is no DVT or SVT noted in the left lower extremity.  Jazilyn Siegenthaler, RVT 05/18/2014, 9:50 AM

## 2014-05-18 NOTE — Progress Notes (Signed)
SLP Cancellation Note  Patient Details Name: LENNI HNAT MRN: [id number] DOB: [birth year]   Cancelled treatment:       Reason Eval/Treat Not Completed: SLP screened, no needs identified, will sign off   Juan Quam Laurice 05/18/2014, 9:41 AM

## 2014-05-18 NOTE — Evaluation (Addendum)
Occupational Therapy Evaluation Patient Details Name: Jill [name] MRN: [id number] DOB: 1931-08-29 Today's Date: 05/18/2014    History of Present Illness 80 y.o. female s/p left total hip arthroplasty on 05/12/14. Upon return home family noted stroke-like symptoms and EMS was called. Upon arrival at ED symptoms had resolved. PMH significant for syncope, HTN, chest pain, DM and chronic kidney disease.   Clinical Impression   Pt s/p above. Pt independent with ADLs, PTA. Feel pt will benefit from acute OT to increase independence prior to d/c.     Follow Up Recommendations  No OT follow up;Supervision/Assistance - 24 hour    Equipment Recommendations  None recommended by OT    Recommendations for Other Services       Precautions / Restrictions Precautions Precautions: Posterior Hip;Fall Precaution Comments: Reviewed precautions Restrictions Weight Bearing Restrictions: Yes LLE Weight Bearing: Weight bearing as tolerated      Mobility Bed Mobility Overal bed mobility: Needs Assistance Bed Mobility: Supine to Sit     Supine to sit: Min assist     General bed mobility comments: Min A to sit upright. Cues for precautions.  Transfers Overall transfer level: Needs assistance Equipment used: Rolling walker (2 wheeled) Transfers: Sit to/from Stand Sit to Stand: Min guard         General transfer comment: cues for technique.    Balance                                            ADL Overall ADL's : Needs assistance/impaired                     Lower Body Dressing: Minimal assistance;With adaptive equipment;Sit to/from stand   Toilet Transfer: Min guard;Ambulation;RW (chair)           Functional mobility during ADLs: Min guard;Rolling walker General ADL Comments: Educated on AE for LB ADLs and pt practiced. Educated on dressing technique. Educated on safety tips for home-safe shoewear, use of bag on walker, rugs.      Vision                     Perception     Praxis      Pertinent Vitals/Pain Pain Assessment: 0-10 Pain Score: 2  (No pain at end of session) Pain Location: LLE Pain Descriptors / Indicators: Aching Pain Intervention(s): Repositioned     Hand Dominance Right   Extremity/Trunk Assessment Upper Extremity Assessment Upper Extremity Assessment: Overall WFL for tasks assessed   Lower Extremity Assessment Lower Extremity Assessment: Defer to PT evaluation       Communication Communication Communication: HOH   Cognition Arousal/Alertness: Awake/alert Behavior During Therapy: WFL for tasks assessed/performed Overall Cognitive Status: Within Functional Limits for tasks assessed (decreased memory)                     General Comments       Exercises       Shoulder Instructions      Home Living Family/patient expects to be discharged to:: Private residence Living Arrangements: Alone Available Help at Discharge: Family;Available 24 hours/day Type of Home: House Home Access: Ramped entrance     Home Layout: One level     Bathroom Shower/Tub: Teacher, early years/pre: Standard   How Accessible: Accessible via walker Home Equipment: [place] - 2 wheels;Bedside commode;Grab  bars - toilet;Grab bars - tub/shower;Tub bench;Wheelchair - Oncologist (thinks she has sockaid)-unsure where AE is at         Prior Functioning/Environment Level of Independence: Needs assistance  Gait / Transfers Assistance Needed: Required assist from family members as well as with the walker for ambulation.  ADL's / Homemaking Assistance Needed: Requires assist for bathing/dressing        OT Diagnosis: Acute pain   OT Problem List: Decreased activity tolerance;Decreased knowledge of use of DME or AE;Decreased knowledge of precautions;Pain;Decreased strength   OT Treatment/Interventions: Self-care/ADL training;DME and/or AE  instruction;Therapeutic activities;Patient/family education;Balance training    OT Goals(Current goals can be found in the care plan section) Acute Rehab OT Goals Patient Stated Goal: not stated OT Goal Formulation: With patient Time For Goal Achievement: 05/25/14 Potential to Achieve Goals: Good ADL Goals Pt Will Perform Lower Body Dressing: with modified independence;sit to/from stand;with adaptive equipment Pt Will Transfer to Toilet: with modified independence;ambulating (3 in 1 over commode)  OT Frequency: Min 2X/week   Barriers to D/C:            Co-evaluation              End of Session Equipment Utilized During Treatment: Gait belt;Rolling walker  Activity Tolerance: Patient tolerated treatment well Patient left: in chair;with call bell/phone within reach;with family/visitor present   Time: 1207-1232 OT Time Calculation (min): 25 min Charges:  OT General Charges $OT Visit: 1 Procedure OT Evaluation $Initial OT Evaluation Tier I: 1 Procedure OT Treatments $Self Care/Home Management : 8-22 mins G-CodesBenito Mccreedy OTR/L [id number] 05/18/2014, 12:56 PM

## 2014-05-18 NOTE — Progress Notes (Signed)
VASCULAR LAB PRELIMINARY  PRELIMINARY  PRELIMINARY  PRELIMINARY  Carotid Dopplers completed.    Preliminary report:  1-39% ICA stenosis.  Vertebral artery flow is antegrade.   Jill [name], RVT 05/18/2014, 9:50 AM

## 2014-05-19 ENCOUNTER — Inpatient Hospital Stay (HOSPITAL_COMMUNITY): Payer: Medicare PPO

## 2014-05-19 DIAGNOSIS — F339 Major depressive disorder, recurrent, unspecified: Secondary | ICD-10-CM

## 2014-05-19 DIAGNOSIS — I779 Disorder of arteries and arterioles, unspecified: Secondary | ICD-10-CM

## 2014-05-19 DIAGNOSIS — G9340 Encephalopathy, unspecified: Secondary | ICD-10-CM

## 2014-05-19 DIAGNOSIS — G459 Transient cerebral ischemic attack, unspecified: Principal | ICD-10-CM

## 2014-05-19 DIAGNOSIS — N3000 Acute cystitis without hematuria: Secondary | ICD-10-CM

## 2014-05-19 DIAGNOSIS — E119 Type 2 diabetes mellitus without complications: Secondary | ICD-10-CM

## 2014-05-19 DIAGNOSIS — N179 Acute kidney failure, unspecified: Secondary | ICD-10-CM

## 2014-05-19 DIAGNOSIS — I10 Essential (primary) hypertension: Secondary | ICD-10-CM

## 2014-05-19 DIAGNOSIS — F1110 Opioid abuse, uncomplicated: Secondary | ICD-10-CM

## 2014-05-19 DIAGNOSIS — N289 Disorder of kidney and ureter, unspecified: Secondary | ICD-10-CM

## 2014-05-19 LAB — GLUCOSE, CAPILLARY
Glucose-Capillary: 74 mg/dL (ref 70–99)
Glucose-Capillary: 185 mg/dL — ABNORMAL HIGH (ref 70–99)
Glucose-Capillary: 144 mg/dL — ABNORMAL HIGH (ref 70–99)
Glucose-Capillary: 157 mg/dL — ABNORMAL HIGH (ref 70–99)

## 2014-05-19 MED ORDER — TIZANIDINE HCL 2 MG PO CAPS: 2.0000 mg | ORAL_CAPSULE | Freq: Three times a day (TID) | ORAL | Status: DC | PRN

## 2014-05-19 MED ORDER — OMEGA-3-ACID ETHYL ESTERS 1 G PO CAPS: 1.0000 g | ORAL_CAPSULE | Freq: Two times a day (BID) | ORAL | Status: DC

## 2014-05-19 MED ORDER — ASPIRIN EC 325 MG PO TBEC
325.0000 mg | DELAYED_RELEASE_TABLET | Freq: Two times a day (BID) | ORAL | Status: DC
  Administered 2014-05-19: 325 mg via ORAL
  Filled 2014-05-19 (×3): qty 1

## 2014-05-19 MED ORDER — CEFUROXIME AXETIL 250 MG PO TABS: 250.0000 mg | ORAL_TABLET | Freq: Two times a day (BID) | ORAL | Status: DC

## 2014-05-19 MED ORDER — OXYCODONE-ACETAMINOPHEN 5-325 MG PO TABS: 1.0000 | ORAL_TABLET | Freq: Four times a day (QID) | ORAL | Status: DC | PRN

## 2014-05-19 NOTE — Progress Notes (Signed)
STROKE TEAM PROGRESS NOTE   HISTORY Jill [name] is an 80 y.o. female, right handed, with a past medical history significant for HTN, hyperlipidemia, DM type II, CKD, hip replacement 4 days ago, admitted to [facility] for further evaluation and management of transient confusion, language impairment and left face weakness. Patient is still somewhat confused and said that she has not recollection of experiencing trouble with her language/speech or having face weakness. As per chart review, " she was noticed to be very lethargic, and looked very tired. Her daughter assumed this was possibly due to the anesthesia and the pain medications".Per her daughter-in-law, " patient was noticed to have left facial droop at about 7:00 PM on 05/15/2014. She also had difficulty in expressing herself. She had difficulty finding the right words to communicate with her daughter-in-law. The patient did not have weakness, numbness or tingling sensations in her extremities. Patient did not have vision or hearing change". Symptoms had resolved at this time. Denies HA, vertigo, double vision, focal weakness or numbness, visual disturbances, CP, SOB, or palpitations. CT brain revealed no acute abnormality. Takes aspirin 81 mg daily. Patient was not administered TPA secondary to symptoms resolved. She was admitted to step down for further evaluation and treatment.   SUBJECTIVE (INTERVAL HISTORY) Her daughter is at the bedside. Shared history of event with Dr. Erlinda Hong. Patient has no memory of events on Thurs. Patient anxious to go home.   OBJECTIVE Temp:  [97.6 F (36.4 C)-98.1 F (36.7 C)] 97.6 F (36.4 C) (09/28 0859) Pulse Rate:  [81-95] 84 (09/28 0859) Cardiac Rhythm:  [-] Normal sinus rhythm (09/28 0851) Resp:  [18] 18 (09/28 0859) BP: (136-165)/(62-68) 136/68 mmHg (09/28 0859) SpO2:  [95 %-98 %] 96 % (09/28 0859) Weight:  [85.1 kg (187 lb 9.8 oz)] 85.1 kg (187 lb 9.8 oz) (09/27 2100)   Recent Labs Lab 05/18/14 0730  05/18/14 1118 05/18/14 1652 05/18/14 2149 05/19/14 0740  GLUCAP 123* 138* 74 185* 144*    Recent Labs Lab 05/13/14 0537 05/15/14 2240 05/17/14 1220 05/18/14 0600  NA 135* 132* 140 142  K 4.7 5.5* 4.0 3.8  CL 101 95* 103 103  CO2 24 24 24 27   GLUCOSE 150* 196* 96 117*  BUN 25* 40* 27* 25*  CREATININE 1.69* 2.38* 1.36* 1.28*  CALCIUM 7.9* 8.6 8.6 8.5    Recent Labs Lab 05/17/14 1220 05/18/14 0600  AST 28 28  ALT 10 12  ALKPHOS 48 47  BILITOT 0.3 0.3  PROT 5.8* 5.6*  ALBUMIN 2.3* 2.3*    Recent Labs Lab 05/14/14 0645 05/15/14 0625 05/15/14 2240 05/17/14 1220 05/18/14 0600  WBC 11.7* 8.9 8.5 5.3 6.5  HGB 9.3* 8.1* 9.0* 8.2* 8.7*  HCT 28.8* 25.1* 27.4* 24.7* 26.0*  MCV 90.9 87.5 87.0 86.4 86.4  PLT 266 266 308 334 364    Recent Labs Lab 05/16/14 0430 05/16/14 0950  TROPONINI <0.30 <0.30   No results found for this basename: LABPROT, INR,  in the last 72 hours No results found for this basename: COLORURINE, APPERANCEUR, LABSPEC, PHURINE, GLUCOSEU, HGBUR, BILIRUBINUR, KETONESUR, PROTEINUR, UROBILINOGEN, NITRITE, LEUKOCYTESUR,  in the last 72 hours     Component Value Date/Time   CHOL 137 05/16/2014 0430   TRIG 120 05/16/2014 0430   HDL 41 05/16/2014 0430   CHOLHDL 3.3 05/16/2014 0430   VLDL 24 05/16/2014 0430   LDLCALC 72 05/16/2014 0430   Lab Results  Component Value Date   HGBA1C 7.7* 05/12/2014   No results found  for this basename: labopia,  cocainscrnur,  labbenz,  amphetmu,  thcu,  labbarb    No results found for this basename: ETH,  in the last 168 hours  Dg Chest 2 View 05/15/2014    No active cardiopulmonary disease.     Ct Head Wo Contrast   05/16/2014    1. No acute intracranial findings.  2. Moderate atrophy and chronic small vessel disease.  3. Left sphenoid sinus effusion.     MRI / MRA Head Wo Contrast 05/16/2014    1. No acute intracranial abnormality.  2. Moderate chronic small vessel ischemic disease and cerebral atrophy.  3. No  major intracranial arterial occlusion or significant proximal stenosis. Mild-to-moderate branch vessel irregularity involving the anterior and posterior circulations likely reflecting atherosclerosis and accentuated by motion artifact.     Nm Pulmonary Perf And Vent 05/16/2014    1. Low probability (10-19%) for pulmonary embolism, as above.  2. Turbulent air flow, as seen with COPD.     Carotid Doppler  No evidence of hemodynamically significant internal carotid artery stenosis. Vertebral artery flow is antegrade.   2D Echocardiogram  EF 55-60% with no source of embolus. Mild basal septal hypertrophy with LVEF 0000000, normal diastolic function. Mild mitral regurgitation. Trivial tricuspid regurgitation with RV-RA gradient 27 mmHg, could not estimate CVP.   LLE venous duplex - There is no DVT or SVT noted in the left lower extremity.  EEG This awake and asleep EEG is within normal limits for age.   PHYSICAL EXAM Pleasant elderly lady not in distress.Awake alert. Afebrile. Head is nontraumatic. Neck is supple without bruit. Hearing is normal. Cardiac exam no murmur or gallop. Lungs are clear to auscultation. Distal pulses are well felt. Neurological Exam :  Awake  Alert oriented x 3. Normal speech and language.eye movements full without nystagmus. Vision fields normal. Hearing is normal. Palatal movements are normal. Face symmetric. Tongue midline. Normal strength, tone, reflexes and coordination. Normal sensation. Gait deferred.  ASSESSMENT/PLAN [name] is a 81 y.o. female with history of HTN, hyperlipidemia, DM type II, CKD, hip replacement 4 days prior to admission presenting with transient confusion, language impairment and left face weakness. She did not receive IV t-PA due to symptom resolution and recent surgery. Initial CT imaging negative for infarct.   Confusion, most likely secondary to narcotics/anesthetics in lady with CKD and UTI.  TIA less likely  aspirin 325 mg  orally every day prior to admission, now on aspirin 325 mg orally every day  MRI - No acute intracranial abnormality.   MRA - No major intracranial arterial occlusion   Carotid Dopppes - no significant stenosis  LLE venous duplex - no VTE or SVT noted   2-D echo - No cardiac source of emboli identified.  Heparin 5000 units sq tid for VTE prophylaxis  Activity - up with assistance - WBAT  No resultant neuro symptoms  Therapy recommendations:  HHPT, HHOT  Ongoing aggressive risk factor management  Risk factor education  Patient counseled to be compliant with her antithrombotic medications  Disposition:  Home with HH PT and OT No further stroke workup indicated. Stroke Service will sign off. Please call should any needs arise. No stroke Follow up indicated  Hypertension   BP goal long term normotensive  Not on home meds  Stable  Hyperlipidemia  Home meds:  Fenofibrate 108 mg daily. No statin d/t H/o Statin allergies.   LDL 72   LDL goal <70 for diabetics  Continue fenofibrate  at discharge  Diabetes  HgbA1c 7.7, Goal < 7.0  Uncontrolled  Resume Home metformin at discharge  Other Stroke Risk Factors Advanced age Former smoker    Obesity, Body mass index is 34.31 kg/(m^2).   Other Active Problems  CKD stage III, baseline creatinint 1.2-1.9. Elevated on admission due to dehydration.  Left total hip arthroscopy, 05/12/2014 WBAT, HH PT, RN, CPM, walker & 3N1  Hyperkalemia 5.5 on admission, got kayexalate, resolved - 3.8 on 05/18/2014   Anemia - H/H 8.7 / 26  Hypoxia - VQ scan 05/16/2014 low probability for pulmonary embolism  UTI - IV Rocephin day number 3  Hospital day # 4  SHARON BIBY, MSN, RN, ANVP-BC, ANP-BC, [facility] Pager: [phone] 05/19/2014 2:21 PM   I, the attending vascular neurologist, have personally obtained a history, examined the patient, evaluated laboratory data, individually viewed imaging studies,  and formulated the assessment and plan of care.  I have made any additions or clarifications directly to the above note and agree with the findings and plan as currently documented.   Rosalin Hawking, MD PhD Stroke Neurology 05/20/2014 10:01 PM  To contact Stroke Continuity provider, please refer to [email or web address] After hours, contact General Neurology

## 2014-05-19 NOTE — Progress Notes (Signed)
EEG Completed; Results Pending  

## 2014-05-19 NOTE — Progress Notes (Signed)
Patient Discharge: Disposition: Patient discharged home with the family members.   Education: Patient educated about the Transient ischemic stroke and handed out a easy to read copy. IV: Removed peripheral IV before discharge. Telemetry: Removed Telemetry before discharge and informed CCMD. Follow-up appointments:  Informed about the follow-up appointment. Prescriptions: Reinforced prescriptions and medications, patient and family understood and acknowledged. Transportation: Patient was transported via w/c with the staff member accompanying till car. Belongings: Patient took all her belongings with her.

## 2014-05-19 NOTE — Progress Notes (Signed)
CARE MANAGEMENT NOTE 05/19/2014  Patient:  Jill [name], Jill [name]   Account Number:  [id number]  Date Initiated:  05/19/2014  Documentation initiated by:  Lizabeth Leyden  Subjective/Objective Assessment:   admitted with AMS, facial droop  medical hx: 9/23 recent left hip replacement     Action/Plan:   progression of care and discharge planning   Anticipated DC Date:  05/21/2014   Anticipated DC Plan:  [facility]  In-house referral  Clinical Social Worker         Choice offered to / List presented to:             Status of service:  In process, will continue to follow Medicare Important Message given?  YES (If response is "NO", the following Medicare IM given date fields will be blank) Date Medicare IM given:  05/19/2014 Medicare IM given by:  Lizabeth Leyden Date Additional Medicare IM given:   Additional Medicare IM given by:    Discharge Disposition:    Per UR Regulation:    If discussed at Long Length of Stay Meetings, dates discussed:    Comments:

## 2014-05-19 NOTE — Procedures (Signed)
ELECTROENCEPHALOGRAM REPORT  Date of Study: 05/19/2014  Patient's Name: Jill [name] MRN: [id number] Date of Birth: 02/17/1932  Referring Provider: Mikey Bussing  Clinical History:  This is an 80 year old woman with transient confusion, language impairment and left face weakness.  Medications: aspirin EC tablet 325 mg  aspirin suppository 300 mg  cefTRIAXone (ROCEPHIN) 1 g in dextrose 5 % 50 mL IVPB  citalopram (CELEXA) tablet 40 mg  diphenhydrAMINE (BENADRYL) capsule 25 mg   Technical Summary: A multichannel digital EEG recording measured by the international 10-20 system with electrodes applied with paste and impedances below 5000 ohms performed in our laboratory with EKG monitoring in an awake and asleep patient.  Hyperventilation and photic stimulation were not performed.  The digital EEG was referentially recorded, reformatted, and digitally filtered in a variety of bipolar and referential montages for optimal display.    Description: The patient is awake and asleep during the recording.  During maximal wakefulness, there is a symmetric, medium voltage 9 Hz posterior dominant rhythm that attenuates with eye opening.  The record is symmetric.  During drowsiness and sleep, there is an increase in theta slowing of the background, with shifting asymmetry seen over the bilateral temporal regions, right greater than left.  Vertex waves and symmetric sleep spindles were seen. There were no epileptiform discharges or electrographic seizures seen.    EKG lead was unremarkable.  Impression: This awake and asleep EEG is within normal limits for age.  Clinical Correlation: A normal EEG does not exclude a clinical diagnosis of epilepsy.  Clinical correlation is advised.   Ellouise Newer, M.D.

## 2014-05-19 NOTE — Progress Notes (Signed)
Physical Therapy Treatment Patient Details Name: Jill [name] MRN: [id number] DOB: March 24, 1932 Today's Date: 05/19/2014    History of Present Illness 80 y.o. female s/p left total hip arthroplasty on 05/12/14. Upon return home family noted stroke-like symptoms and EMS was called. Upon arrival at ED symptoms had resolved. PMH significant for syncope, HTN, chest pain, DM and chronic kidney disease.    PT Comments    Session focused on assessment to discern safety for manageing at daughter's home with HHPT follow-up; Pt progressing well; walking household distances, and will have adequate assist at home  OK for dc home from PT standpoint    Follow Up Recommendations  Home health PT;Supervision for mobility/OOB     Equipment Recommendations  None recommended by PT    Recommendations for Other Services       Precautions / Restrictions Precautions Precautions: Posterior Hip;Fall Precaution Comments: Reviewed precautions Restrictions LLE Weight Bearing: Weight bearing as tolerated    Mobility  Bed Mobility                  Transfers Overall transfer level: Needs assistance Equipment used: Rolling walker (2 wheeled) Transfers: Sit to/from Stand Sit to Stand: Min guard         General transfer comment: cues for technique.Good control of descent to chair  Ambulation/Gait Ambulation/Gait assistance: Min guard Ambulation Distance (Feet): 110 Feet Assistive device: Rolling walker (2 wheeled) Gait Pattern/deviations: Step-to pattern;Trunk flexed Gait velocity: Decreased   General Gait Details: Cues for posture, gait sequence; minguard with and without phsyical contact for safety and steadiness   Stairs            Wheelchair Mobility    Modified Rankin (Stroke Patients Only)       Balance           Standing balance support: Bilateral upper extremity supported Standing balance-Leahy Scale: Poor Standing balance comment: Uses RW for support and  balance in standing                    Cognition Arousal/Alertness: Awake/alert Behavior During Therapy: WFL for tasks assessed/performed Overall Cognitive Status: Within Functional Limits for tasks assessed                      Exercises      General Comments        Pertinent Vitals/Pain Pain Assessment: No/denies pain    Home Living                      Prior Function            PT Goals (current goals can now be found in the care plan section) Acute Rehab PT Goals Patient Stated Goal: REALLY wanting [place] home PT Goal Formulation: With patient Time For Goal Achievement: 05/24/14 Potential to Achieve Goals: Good Progress towards PT goals: Progressing toward goals    Frequency  Min 3X/week    PT Plan Current plan remains appropriate    Co-evaluation             End of Session Equipment Utilized During Treatment: Gait belt Activity Tolerance: Patient tolerated treatment well Patient left: in chair;with call bell/phone within reach;with family/visitor present     Time: 1227-1246 PT Time Calculation (min): 19 min  Charges:  $Gait Training: 8-22 mins                    G Codes:  Roney Marion Hamff 05/19/2014, 1:33 PM  Roney Marion, [place]  Acute Rehabilitation Services Pager [phone] Office [phone]

## 2014-05-19 NOTE — Discharge Summary (Signed)
Physician Discharge Summary  Jill [name] [id number] DOB: 12/24/1931 DOA: 05/15/2014  PCP: Jill Lofts, MD  Admit date: 05/15/2014 Discharge date: 05/19/2014  Time spent: >30 minutes  Recommendations for Outpatient Follow-up:  1. Follow resolution of UTI 2. Check BMET to follow electrolytes and renal function 3. Please consider changing celexa to 20mg  daily if appropriate due to risk of side effects given patient age  Discharge Diagnoses:  TIA (transient ischemic attack) Acute encephalopathy due to UTI UTI DIABETES MELLITUS II, UNCOMPLICATED HYPERLIPIDEMIA DEPRESSION, MAJOR, RECURRENT HYPERTENSION, BENIGN SYSTEMIC Acute on CHRONIC KIDNEY DISEASE STAGE III (MODERATE) S/P hip replacement Hyperkalemia   Discharge Condition: stable and improved. Will discharge home with family care and [facility] services.  Diet recommendation: heart healthy and low fat diet  Filed Weights   05/16/14 2103 05/17/14 2047 05/18/14 2100  Weight: 86.699 kg (191 lb 2.2 oz) 84.324 kg (185 lb 14.4 oz) 85.1 kg (187 lb 9.8 oz)    History of present illness:  80 y.o. female with PMH of hypertension, hyperlipidemia, diabetes type 2, CKD-III, and recent L hip replacement 4 days ago, who present with confusion, slurred speech and left facial droop.  Patient confused on admission and unable to apport much on HPI. The history was obtained from her daughter and daughter-in-law. Her daughter states that she was discharged from hospital 05/14/14 after L hip replacement. After she went home, she was noticed to be very lethargic, and looked very tired. Her daughter assumed this was posttibially due to the anesthesia and the pain medications. Her last dose of oxycodone was in the hospital earlier on 9/23 prior to discharge. Patient also had a left facial droop and slurred speech. They has noticed increase frequency, but no complaints of dysuria, CP, SOB, nausea, vomiting or fever/chills.    Hospital Course:  1-presumed TIA  vs encephalopathy from UTI -patient symptoms completely resolved and has been asymptomatic since -neurologic work up negative for stroke -no signs of seizure activities on abnormalities on EEG -will discharge on ASA on daily basis and patient advise to follow low fat diet -see below for treatment of UTI  2-UTI: no specific microorganism isolated as cx taken after patient was on antibiotics -after treatment initiated no further urgency or dysuria reported -patient will complete a total of 7 days of antibiotics (will discharge on ceftin BID) -patient advise to maintain good hydration -no fever or WBC's at discharge  3-acute on chronic renal failure: stage 3 at baseline -due to dehydration and UTI most likely -improved and back to baseline after IVF's and antibiotics given -close monitoring in outpatient setting  4-bilateral mild ICA stenosis: 1-39% -daily ASA and lovaza -patient intolerant to statins  5-HLD: started on Lovaza -advise to follow low fat diet -continue fenofibrate   6-Status post TOTAL HIP ARTHROPLASTY (Left ) -ASA 325 mg BID x 2 weeks, and then continue full dose ASA daily -HHPT, HHRN, CPM, Walker and 3-in-1 comode seat  -Pt is to follow up in clinic in 2 weeks for wound check. The pt does not need suture or stable removal as the sutures are buried   7-Diabetes: continue metformin  8-hyperkalemia: resolved. -patient received one dose of kayexalate   9-depression: continue celexa  Procedures:  2-D Echo: 05/17/14 - Left ventricle: The cavity size was normal. There was mild focal basal hypertrophy of the septum. Systolic function was normal. The estimated ejection fraction was in the range of 55% to 60%. Although no diagnostic regional wall motion abnormality was identified, this possibility cannot  be completely excluded on the basis of this study. Left ventricular diastolic function parameters were normal. - Mitral valve: There was mild regurgitation. -  Tricuspid valve: There was trivial regurgitation. Peak RV-RA gradient (S): 27 mm Hg. - Inferior vena cava: Unable to estimate CVP. - Pericardium, extracardiac: There was no pericardial effusion.  Impressions: - Mild basal septal hypertrophy with LVEF 0000000, normal diastolic function. Mild mitral regurgitation. Trivial tricuspid regurgitation with RV-RA gradient 27 mmHg, could not estimate CVP.   Carotid dopplers: mild ICA stenosis bilaterally 1-39%   Lower extremity Duplex: negative for DVT, SVT or baker's cyst   EEG: WNL.   V/Q Scan: negative for PE    Consultations:  Neurology   Discharge Exam: Filed Vitals:   05/19/14 0859  BP: 136/68  Pulse: 84  Temp: 97.6 F (36.4 C)  Resp: 18    General: AAOX3, no acute distress, afebrile. Wants to go home. Denies CP, SOB and dysuria. Cardiovascular: S1 and S2, no rubs or gallops Respiratory: CTA bilaterally Abdomen: soft, NT, ND, positive BS Extremities: no cyanosis or clubbing   Discharge Instructions You were cared for by a hospitalist during your hospital stay. If you have any questions about your discharge medications or the care you received while you were in the hospital after you are discharged, you can call the unit and asked to speak with the hospitalist on call if the hospitalist that took care of you is not available. Once you are discharged, your primary care physician will handle any further medical issues. Please note that NO REFILLS for any discharge medications will be authorized once you are discharged, as it is imperative that you return to your primary care physician (or establish a relationship with a primary care physician if you do not have one) for your aftercare needs so that they can reassess your need for medications and monitor your lab values.  Discharge Instructions   Diet - low sodium heart healthy    Complete by:  As directed      Discharge instructions    Complete by:  As directed   Maintain  yourself well hydrated Take medications as prescribed Arrange follow up with PCP in 10 days          Current Discharge Medication List    START taking these medications   Details  cefUROXime (CEFTIN) 250 MG tablet Take 1 tablet (250 mg total) by mouth 2 (two) times daily with a meal. Qty: 10 tablet, Refills: 0    omega-3 acid ethyl esters (LOVAZA) 1 G capsule Take 1 capsule (1 g total) by mouth 2 (two) times daily. Qty: 60 capsule, Refills: 1      CONTINUE these medications which have CHANGED   Details  oxyCODONE-acetaminophen (ROXICET) 5-325 MG per tablet Take 1 tablet by mouth every 6 (six) hours as needed.    tizanidine (ZANAFLEX) 2 MG capsule Take 1 capsule (2 mg total) by mouth 3 (three) times daily as needed for muscle spasms.      CONTINUE these medications which have NOT CHANGED   Details  aspirin EC 325 MG tablet Take 1 tablet (325 mg total) by mouth 2 (two) times daily. Qty: 30 tablet, Refills: 0    citalopram (CELEXA) 40 MG tablet Take 1 tablet (40 mg total) by mouth daily. Qty: 90 tablet, Refills: 3   Associated Diagnoses: Depression    Cyanocobalamin (VITAMIN B 12 PO) Take 1 tablet by mouth daily.    fenofibrate 54 MG tablet Take 108 mg  by mouth daily.    glucose blood (BLOOD GLUCOSE TEST STRIPS) test strip One Touch-Use as directed-2 to 3 times a day     metFORMIN (GLUCOPHAGE) 500 MG tablet Take 500 mg by mouth daily.        Allergies  Allergen Reactions  . Lovastatin     REACTION: leg pain  . Niacin   . Statins     REACTION: leg cramps, weakness  . Codeine Rash   Follow-up Information   Follow up with Jill Lofts, MD. Schedule an appointment as soon as possible for a visit in 1 week.   Specialty:  Family Medicine   Contact information:   [place] [address] [phone]       The results of significant diagnostics from this hospitalization (including imaging, microbiology, ancillary and  laboratory) are listed below for reference.    Significant Diagnostic Studies: Dg Chest 2 View  05/15/2014   CLINICAL DATA:  Altered mental status and fever for 1 day  EXAM: CHEST  2 VIEW  COMPARISON:  05/08/2014  FINDINGS: The heart size and mediastinal contours are within normal limits. Both lungs are clear. The visualized skeletal structures are unremarkable.  IMPRESSION: No active cardiopulmonary disease.   Electronically Signed   By: Skipper Cliche M.D.   On: 05/15/2014 23:17   Dg Chest 2 View  05/08/2014   CLINICAL DATA:  Short of breath  EXAM: CHEST  2 VIEW  COMPARISON:  01/20/2009  FINDINGS: Heart size appears normal. No pleural effusion or edema. No airspace consolidation identified. Mild peribronchial thickening is again noted and appears similar to previous exam.  IMPRESSION: 1. No acute findings. 2. Chronic bronchitic changes noted   Electronically Signed   By: Kerby Moors M.D.   On: 05/08/2014 15:57   Ct Head Wo Contrast  05/16/2014   CLINICAL DATA:  Altered mental status with expressive a aphasia and left facial droop, reportedly resolved.  EXAM: CT HEAD WITHOUT CONTRAST  TECHNIQUE: Contiguous axial images were obtained from the base of the skull through the vertex without intravenous contrast.  COMPARISON:  None.  FINDINGS: Skull and Sinuses:No fracture or destructive process. Left sphenoid sinus effusion which may reflect retained secretions as there is no associated mucosal thickening.  Orbits: Bilateral cataract resection.  Brain: No evidence of acute abnormality, such as acute infarction, hemorrhage, hydrocephalus, or mass lesion/mass effect. There is brain atrophy which has a bifrontal predominance. Moderate chronic small-vessel disease with patchy ischemic gliosis throughout the bilateral deep cerebral white matter.  IMPRESSION: 1. No acute intracranial findings. 2. Moderate atrophy and chronic small vessel disease. 3. Left sphenoid sinus effusion.   Electronically Signed   By:  Jorje Guild M.D.   On: 05/16/2014 03:34   Mr Jodene Nam Head Wo Contrast  05/16/2014   CLINICAL DATA:  Confusion, slurred speech, and left facial droop. TIA.  EXAM: MRI HEAD WITHOUT CONTRAST  MRA HEAD WITHOUT CONTRAST  TECHNIQUE: Multiplanar, multiecho pulse sequences of the brain and surrounding structures were obtained without intravenous contrast. Angiographic images of the head were obtained using MRA technique without contrast.  COMPARISON:  Head CT 05/16/2014  FINDINGS: MRI HEAD FINDINGS  Images are mildly degraded by motion artifact. There is no evidence of acute infarct, intracranial hemorrhage, mass, midline shift, or extra-axial fluid collection. There is moderate cerebral atrophy with mild frontal lobe predominant. Patchy and confluent T2 hyperintensities in the subcortical and deep cerebral white matter are nonspecific but  compatible with moderate chronic small vessel ischemic disease.  Prior bilateral cataract extraction is noted. Mastoid air cells are clear. Small amount of fluid is again seen in the left sphenoid sinus. Major intracranial vascular flow voids are preserved.  MRA HEAD FINDINGS  Images are mildly degraded by motion artifact. Visualized distal vertebral arteries are patent with the left being mildly dominant. PICA origins are patent. SCA origins are grossly patent but are small in caliber and not well evaluated. Basilar artery is patent without stenosis. Posterior communicating arteries are not identified. P1 segments are patent without stenosis. Mild right and moderate left PCA branch vessel irregular narrowing is present.  Internal carotid arteries are patent from skullbase to carotid termini without stenosis. ACAs are unremarkable. Proximal MCAs are unremarkable without stenosis. There is mild-to-moderate bilateral MCA branch vessel irregularity. No intracranial aneurysm is identified.  IMPRESSION: 1. No acute intracranial abnormality. 2. Moderate chronic small vessel ischemic disease  and cerebral atrophy. 3. No major intracranial arterial occlusion or significant proximal stenosis. Mild-to-moderate branch vessel irregularity involving the anterior and posterior circulations likely reflecting atherosclerosis and accentuated by motion artifact.   Electronically Signed   By: Logan Bores   On: 05/16/2014 15:21   Mr Brain Wo Contrast  05/16/2014   CLINICAL DATA:  Confusion, slurred speech, and left facial droop. TIA.  EXAM: MRI HEAD WITHOUT CONTRAST  MRA HEAD WITHOUT CONTRAST  TECHNIQUE: Multiplanar, multiecho pulse sequences of the brain and surrounding structures were obtained without intravenous contrast. Angiographic images of the head were obtained using MRA technique without contrast.  COMPARISON:  Head CT 05/16/2014  FINDINGS: MRI HEAD FINDINGS  Images are mildly degraded by motion artifact. There is no evidence of acute infarct, intracranial hemorrhage, mass, midline shift, or extra-axial fluid collection. There is moderate cerebral atrophy with mild frontal lobe predominant. Patchy and confluent T2 hyperintensities in the subcortical and deep cerebral white matter are nonspecific but compatible with moderate chronic small vessel ischemic disease.  Prior bilateral cataract extraction is noted. Mastoid air cells are clear. Small amount of fluid is again seen in the left sphenoid sinus. Major intracranial vascular flow voids are preserved.  MRA HEAD FINDINGS  Images are mildly degraded by motion artifact. Visualized distal vertebral arteries are patent with the left being mildly dominant. PICA origins are patent. SCA origins are grossly patent but are small in caliber and not well evaluated. Basilar artery is patent without stenosis. Posterior communicating arteries are not identified. P1 segments are patent without stenosis. Mild right and moderate left PCA branch vessel irregular narrowing is present.  Internal carotid arteries are patent from skullbase to carotid termini without  stenosis. ACAs are unremarkable. Proximal MCAs are unremarkable without stenosis. There is mild-to-moderate bilateral MCA branch vessel irregularity. No intracranial aneurysm is identified.  IMPRESSION: 1. No acute intracranial abnormality. 2. Moderate chronic small vessel ischemic disease and cerebral atrophy. 3. No major intracranial arterial occlusion or significant proximal stenosis. Mild-to-moderate branch vessel irregularity involving the anterior and posterior circulations likely reflecting atherosclerosis and accentuated by motion artifact.   Electronically Signed   By: Logan Bores   On: 05/16/2014 15:21   Dg Pelvis Portable  05/12/2014   CLINICAL DATA:  Postop left hip replacement  EXAM: PORTABLE PELVIS 1-2 VIEWS  COMPARISON:  Radiograph 01/23/2009  FINDINGS: Interval left total hip arthroplasty. The femoral component and acetabular component well seated. No evidence fracture. Right hip arthroplasty again noted.  IMPRESSION: No complication following left hip arthroplasty.   Electronically Signed  By: Suzy Bouchard M.D.   On: 05/12/2014 12:57   Nm Pulmonary Perf And Vent  05/16/2014   CLINICAL DATA:  Hypoxia 3 days after hip replacement  EXAM: NUCLEAR MEDICINE VENTILATION - PERFUSION LUNG SCAN  TECHNIQUE: Ventilation images were obtained in multiple projections using inhaled aerosol technetium 99 M DTPA. Perfusion images were obtained in multiple projections after intravenous injection of Tc-1m MAA.  RADIOPHARMACEUTICALS:  Forty mCi Tc-48m DTPA aerosol and 6 mCi Tc-71m MAA  COMPARISON:  None.  FINDINGS: Abnormal lung perfusion with a wedge-shaped area of decreased flow, segmental in shape, at the right peripheral base. Even when accounting for poor lung ventilation due to turbulent flow, the defect is matched in both the anterior and LAO projections. Small area of photopenia at the right base is likely related to the diaphragm (mildly elevated on the right). An apparent posterior right lung  photopenic area on posterior profusion is likely hilar. No lung opacity on recent chest x-ray. Single large matched defect is low probability (10-19%) by PIOPED II.  IMPRESSION: 1. Low probability (10-19%) for pulmonary embolism, as above. 2. Turbulent air flow, as seen with COPD.   Electronically Signed   By: Jorje Guild M.D.   On: 05/16/2014 03:01    Microbiology: Recent Results (from the past 240 hour(s))  CLOSTRIDIUM DIFFICILE BY PCR     Status: None   Collection Time    05/16/14  1:46 PM      Result Value Ref Range Status   C difficile by pcr NEGATIVE  NEGATIVE Final     Labs: Basic Metabolic Panel:  Recent Labs Lab 05/13/14 0537 05/15/14 2240 05/17/14 1220 05/18/14 0600  NA 135* 132* 140 142  K 4.7 5.5* 4.0 3.8  CL 101 95* 103 103  CO2 24 24 24 27   GLUCOSE 150* 196* 96 117*  BUN 25* 40* 27* 25*  CREATININE 1.69* 2.38* 1.36* 1.28*  CALCIUM 7.9* 8.6 8.6 8.5   Liver Function Tests:  Recent Labs Lab 05/17/14 1220 05/18/14 0600  AST 28 28  ALT 10 12  ALKPHOS 48 47  BILITOT 0.3 0.3  PROT 5.8* 5.6*  ALBUMIN 2.3* 2.3*   CBC:  Recent Labs Lab 05/14/14 0645 05/15/14 0625 05/15/14 2240 05/17/14 1220 05/18/14 0600  WBC 11.7* 8.9 8.5 5.3 6.5  HGB 9.3* 8.1* 9.0* 8.2* 8.7*  HCT 28.8* 25.1* 27.4* 24.7* 26.0*  MCV 90.9 87.5 87.0 86.4 86.4  PLT 266 266 308 334 364   Cardiac Enzymes:  Recent Labs Lab 05/16/14 0430 05/16/14 0950  TROPONINI <0.30 <0.30   CBG:  Recent Labs Lab 05/18/14 0730 05/18/14 1118 05/18/14 1652 05/18/14 2149 05/19/14 0740  GLUCAP 123* 138* 74 185* 144*    Signed:  Barton Dubois  [facility] 05/19/2014, 2:16 PM

## 2014-05-19 NOTE — Clinical Social Work Note (Signed)
CSW received consult for SNF placement. Visit made to room earlier today and talked with patient and son and was informed that patient will d/c to son's home and will have 24 hour care.   Novie Maggio Givens, MSW, LCSW [phone]

## 2014-05-19 NOTE — Clinical Documentation Improvement (Signed)
"  Stage 3 CKD"  And "Dehydration" documented in current medical record.    CHL Review Component     Latest Ref Rng 05/15/2014 05/17/2014 05/18/2014  BUN     6 - 23 mg/dL 40 (H) 27 (H) 25 (H)  Creatinine     0.50 - 1.10 mg/dL 2.38 (H) 1.36 (H) 1.28 (H)  GFR calc non Af Amer     >90 mL/min 18 (L) 36 (L) 38 (L)    Please document if a condition below provides greater sepcificity regarding the patient's renal function this admission:   - AKI on S3CKD 2/2 Dehydration   - Other Condition   - Unable to Clinically Determine  Thank you, Vilinda Flake RN [facility] CCDS [phone] [facility] Clinical Documentation Improvement

## 2014-05-23 ENCOUNTER — Ambulatory Visit (INDEPENDENT_AMBULATORY_CARE_PROVIDER_SITE_OTHER): Payer: Medicare PPO | Admitting: Family Medicine

## 2014-05-23 ENCOUNTER — Encounter: Payer: Self-pay | Admitting: Family Medicine

## 2014-05-23 VITALS — BP 163/77 | HR 89 | Temp 98.1°F | Ht 62.0 in | Wt 179.8 lb

## 2014-05-23 DIAGNOSIS — R410 Disorientation, unspecified: Secondary | ICD-10-CM

## 2014-05-23 DIAGNOSIS — N390 Urinary tract infection, site not specified: Secondary | ICD-10-CM | POA: Insufficient documentation

## 2014-05-23 DIAGNOSIS — E875 Hyperkalemia: Secondary | ICD-10-CM

## 2014-05-23 DIAGNOSIS — E119 Type 2 diabetes mellitus without complications: Secondary | ICD-10-CM

## 2014-05-23 DIAGNOSIS — G9340 Encephalopathy, unspecified: Secondary | ICD-10-CM

## 2014-05-23 DIAGNOSIS — I10 Essential (primary) hypertension: Secondary | ICD-10-CM

## 2014-05-23 DIAGNOSIS — F331 Major depressive disorder, recurrent, moderate: Secondary | ICD-10-CM

## 2014-05-23 DIAGNOSIS — N183 Chronic kidney disease, stage 3 unspecified: Secondary | ICD-10-CM

## 2014-05-23 LAB — BASIC METABOLIC PANEL
BUN: 19 mg/dL (ref 6–23)
CALCIUM: 9.4 mg/dL (ref 8.4–10.5)
CO2: 25 meq/L (ref 19–32)
Chloride: 104 mEq/L (ref 96–112)
Creatinine, Ser: 1.5 mg/dL — ABNORMAL HIGH (ref 0.4–1.2)
GFR: 36.04 mL/min — ABNORMAL LOW (ref >60.00)
GLUCOSE: 101 mg/dL — AB (ref 70–99)
Potassium: 5.2 mEq/L — ABNORMAL HIGH (ref 3.5–5.1)
SODIUM: 139 meq/L (ref 135–145)

## 2014-05-23 NOTE — Assessment & Plan Note (Signed)
Elevated today  And last 2 days, well controlled prior and in hospital. Follow BP at home. If continued high off lisinopril consider restarting this versus other meds ( not clearly cause of elevated Cr in past).

## 2014-05-23 NOTE — Assessment & Plan Note (Signed)
Due for re-eval. 

## 2014-05-23 NOTE — Assessment & Plan Note (Signed)
Stable control. 

## 2014-05-23 NOTE — Assessment & Plan Note (Signed)
Complete antibiotics. Symptoms resolved.

## 2014-05-23 NOTE — Progress Notes (Signed)
Pre visit review using our clinic review tool, if applicable. No additional management support is needed unless otherwise documented below in the visit note. 

## 2014-05-23 NOTE — Progress Notes (Signed)
Subjective:    Patient ID: Jill [name], female    DOB: 12-27-1931, 80 y.o.   MRN: [id number]  HPI  80 year old female presents for hospital follow up.    Admit date: 05/15/2014  Discharge date: 05/19/2014   Presented to ER with confusion, slurred speech and left facial droop.  Patient confused on admission and unable to report much on HPI. The history was obtained from her daughter and daughter-in-law. Her daughter states that she was discharged from hospital 05/14/14 after L hip replacement. After she went home, she was noticed to be very lethargic, and looked very tired. Her daughter assumed this was due to the anesthesia and the pain medications. Her last dose of oxycodone was in the hospital earlier on 9/23 prior to discharge. Patient also had a left facial droop and slurred speech. They has noticed increase frequency, but no complaints of dysuria, CP, SOB, nausea, vomiting or fever/chills.   Hospital Course:  1-presumed TIA vs encephalopathy from UTI  -neurologic work up negative for stroke  -no signs of seizure activities on abnormalities on EEG  -will discharge on increase  ASA  and patient advise to follow low fat diet   2-UTI: no specific microorganism isolated as cx taken after patient was on antibiotics  -after treatment initiated no further urgency or dysuria reported  -patient will complete a total of 7 days of antibiotics (will discharge on ceftin BID)  -no fever or WBC's at discharge   3-acute on chronic renal failure: stage 3 at baseline  -due to dehydration and UTI most likely  -improved and back to baseline after IVF's and antibiotics given   4-bilateral mild ICA stenosis: 1-39%  -daily ASA and lovaza  -patient intolerant to statins   5-HLD: started on Lovaza  -advise to follow low fat diet  -continue fenofibrate   6-Status post TOTAL HIP ARTHROPLASTY (Left )  -ASA 325 mg BID x 2 weeks, and then continue full dose ASA daily  -HHPT, HHRN, CPM, Walker and  3-in-1 comode seat  -Pt is to follow up with ortho in 2 weeks for wound check. The pt does not need suture or stable removal as the sutures are buried   7-Diabetes: continue metformin   8-hyperkalemia: resolved.  -patient received one dose of kayexalate   9-depression: continue celexa   Procedures:  2-D Echo: 05/17/14 - Mild basal septal hypertrophy with LVEF 0000000, normal diastolic function. Mild mitral regurgitation. Trivial tricuspid regurgitation with RV-RA gradient 27 mmHg, could not estimate CVP. Carotid dopplers: mild ICA stenosis bilaterally 1-39% Lower extremity Duplex: negative for DVT, SVT or baker's cyst EEG: WNL. V/Q Scan: negative for PE    Today she reports she feels well. No dysuria, no urinary frequency. She is back at baseline. No fever starting yesterday, prior to that 99.0.  Little pain from hip surgery. Has HH coming for PT, OT. She is staying with her son's family.  She is not interested in decreasing dose of celexa.  HTN, elevated today and yesterday. Prior has been well controlled off lisinopril.  She is drinking a lot of fluids now.  Review of Systems  Constitutional: Negative for fever and fatigue.  HENT: Negative for ear pain.   Eyes: Negative for pain.  Respiratory: Negative for chest tightness and shortness of breath.   Cardiovascular: Negative for chest pain, palpitations and leg swelling.  Gastrointestinal: Negative for abdominal pain.  Genitourinary: Negative for dysuria.       Objective:   Physical Exam  Constitutional: She is oriented to person, place, and time. Vital signs are normal. She appears well-developed and well-nourished. She is cooperative.  Non-toxic appearance. She does not appear ill. No distress.  In wheelchair  HENT:  Head: Normocephalic.  Right Ear: Hearing, tympanic membrane, external ear and ear canal normal. Tympanic membrane is not erythematous, not retracted and not bulging.  Left Ear: Hearing, tympanic membrane,  external ear and ear canal normal. Tympanic membrane is not erythematous, not retracted and not bulging.  Nose: No mucosal edema or rhinorrhea. Right sinus exhibits no maxillary sinus tenderness and no frontal sinus tenderness. Left sinus exhibits no maxillary sinus tenderness and no frontal sinus tenderness.  Mouth/Throat: Uvula is midline, oropharynx is clear and moist and mucous membranes are normal.  Eyes: Conjunctivae, EOM and lids are normal. Pupils are equal, round, and reactive to light. Lids are everted and swept, no foreign bodies found.  Neck: Trachea normal and normal range of motion. Neck supple. Carotid bruit is not present. No mass and no thyromegaly present.  Cardiovascular: Normal rate, regular rhythm, S1 normal, S2 normal, normal heart sounds, intact distal pulses and normal pulses.  Exam reveals no gallop and no friction rub.   No murmur heard. Pulmonary/Chest: Effort normal and breath sounds normal. Not tachypneic. No respiratory distress. She has no decreased breath sounds. She has no wheezes. She has no rhonchi. She has no rales.  Abdominal: Soft. Normal appearance and bowel sounds are normal. There is no tenderness.  Neurological: She is alert and oriented to person, place, and time. No cranial nerve deficit.  Skin: Skin is warm, dry and intact. No rash noted.  Psychiatric: She has a normal mood and affect. Her speech is normal and behavior is normal. Judgment and thought content normal. Her mood appears not anxious. Cognition and memory are normal. She does not exhibit a depressed mood.          Assessment & Plan:

## 2014-05-23 NOTE — Assessment & Plan Note (Signed)
Stable on celexa 40 mg daily, pt does not wish to decrease dose.

## 2014-05-23 NOTE — Patient Instructions (Addendum)
Stop at lab on way out. Follow BP at home. MyChart or call the BP in 1-2 weeks, call sooner if BP consistently high to consider restarting BP meds.

## 2014-05-23 NOTE — Assessment & Plan Note (Signed)
Resolved off pain med and after treatment of UTI.

## 2014-05-24 ENCOUNTER — Encounter: Payer: Self-pay | Admitting: Family Medicine

## 2014-05-26 ENCOUNTER — Other Ambulatory Visit (HOSPITAL_COMMUNITY): Payer: Medicare PPO

## 2014-05-26 ENCOUNTER — Telehealth: Payer: Self-pay | Admitting: Family Medicine

## 2014-05-26 NOTE — Telephone Encounter (Signed)
emmi emailed °

## 2014-05-27 ENCOUNTER — Encounter: Payer: Self-pay | Admitting: Family Medicine

## 2014-05-27 ENCOUNTER — Telehealth: Payer: Self-pay

## 2014-05-27 NOTE — Telephone Encounter (Signed)
Lab orders faxed to [facility].  AttnArman Bogus.

## 2014-05-27 NOTE — Telephone Encounter (Signed)
Jill [name] with [facility] notified no need to repeat UA per Dr. Diona Browner.

## 2014-05-27 NOTE — Telephone Encounter (Signed)
Jill [name] with [facility] left v/m; pt has a nurse visit with [place] scheduled at end of week and Jill [name] wants to know if Dr Diona Browner wants repeat urine on 05/29/14 or 05/30/14.Jill [name] request cb.

## 2014-05-27 NOTE — Telephone Encounter (Signed)
No repeat needed

## 2014-05-29 ENCOUNTER — Other Ambulatory Visit: Payer: Medicare PPO

## 2014-05-30 ENCOUNTER — Encounter: Payer: Self-pay | Admitting: Family Medicine

## 2014-06-03 ENCOUNTER — Encounter: Payer: Self-pay | Admitting: Family Medicine

## 2014-06-09 ENCOUNTER — Emergency Department (HOSPITAL_COMMUNITY): Payer: Medicare PPO

## 2014-06-09 ENCOUNTER — Encounter (HOSPITAL_COMMUNITY): Payer: Self-pay | Admitting: Emergency Medicine

## 2014-06-09 ENCOUNTER — Emergency Department (HOSPITAL_COMMUNITY)
Admission: EM | Admit: 2014-06-09 | Discharge: 2014-06-09 | Disposition: A | Discharge status: Home / Self Care | Payer: Medicare PPO | Attending: Emergency Medicine | Admitting: Emergency Medicine

## 2014-06-09 DIAGNOSIS — I129 Hypertensive chronic kidney disease with stage 1 through stage 4 chronic kidney disease, or unspecified chronic kidney disease: Secondary | ICD-10-CM | POA: Insufficient documentation

## 2014-06-09 DIAGNOSIS — Z7982 Long term (current) use of aspirin: Secondary | ICD-10-CM | POA: Diagnosis not present

## 2014-06-09 DIAGNOSIS — Z8719 Personal history of other diseases of the digestive system: Secondary | ICD-10-CM | POA: Diagnosis not present

## 2014-06-09 DIAGNOSIS — E785 Hyperlipidemia, unspecified: Secondary | ICD-10-CM | POA: Diagnosis not present

## 2014-06-09 DIAGNOSIS — F329 Major depressive disorder, single episode, unspecified: Secondary | ICD-10-CM | POA: Diagnosis not present

## 2014-06-09 DIAGNOSIS — Z79899 Other long term (current) drug therapy: Secondary | ICD-10-CM | POA: Insufficient documentation

## 2014-06-09 DIAGNOSIS — X58XXXA Exposure to other specified factors, initial encounter: Secondary | ICD-10-CM | POA: Diagnosis not present

## 2014-06-09 DIAGNOSIS — Z96642 Presence of left artificial hip joint: Secondary | ICD-10-CM | POA: Insufficient documentation

## 2014-06-09 DIAGNOSIS — Z87891 Personal history of nicotine dependence: Secondary | ICD-10-CM | POA: Diagnosis not present

## 2014-06-09 DIAGNOSIS — S79912A Unspecified injury of left hip, initial encounter: Secondary | ICD-10-CM | POA: Insufficient documentation

## 2014-06-09 DIAGNOSIS — N189 Chronic kidney disease, unspecified: Secondary | ICD-10-CM | POA: Insufficient documentation

## 2014-06-09 DIAGNOSIS — E119 Type 2 diabetes mellitus without complications: Secondary | ICD-10-CM | POA: Diagnosis not present

## 2014-06-09 DIAGNOSIS — Y9289 Other specified places as the place of occurrence of the external cause: Secondary | ICD-10-CM | POA: Insufficient documentation

## 2014-06-09 DIAGNOSIS — Y9389 Activity, other specified: Secondary | ICD-10-CM | POA: Insufficient documentation

## 2014-06-09 DIAGNOSIS — M25552 Pain in left hip: Secondary | ICD-10-CM

## 2014-06-09 DIAGNOSIS — M1990 Unspecified osteoarthritis, unspecified site: Secondary | ICD-10-CM | POA: Insufficient documentation

## 2014-06-09 MED ORDER — HYDROCODONE-ACETAMINOPHEN 5-325 MG PO TABS
2.0000 | ORAL_TABLET | Freq: Once | ORAL | Status: AC
  Administered 2014-06-09: 2 via ORAL
  Filled 2014-06-09: qty 2

## 2014-06-09 MED ORDER — HYDROCODONE-ACETAMINOPHEN 5-325 MG PO TABS: 1.0000 | ORAL_TABLET | ORAL | Status: DC | PRN

## 2014-06-09 NOTE — ED Notes (Addendum)
Pt in c/o pain to left hip, pt had a left hip replacement on 9/21, today she bent over to pick something up and heard a pop, since that time has been unable to bear weight on that leg without extreme pain, this pain is new and acute since this incident. CMS intact. Pt only c/o pain with movement.

## 2014-06-09 NOTE — ED Notes (Signed)
Pt placed in gown and in bed. Pt monitored by pulse ox and 5-lead. MD in room talking to family.

## 2014-06-09 NOTE — ED Notes (Signed)
Family in room helping pt dress.

## 2014-06-09 NOTE — ED Provider Notes (Signed)
CSN: [id number]     Arrival date & time 06/09/14  1454 History   First MD Initiated Contact with Patient 06/09/14 1831     Chief Complaint  Patient presents with  . Hip Pain     (Consider location/radiation/quality/duration/timing/severity/associated sxs/prior Treatment) HPI 80 year old female with bilateral prior hip replacements with her left hip replaced about one month ago and was doing well taking no medicines and walking with a cane not having to use her walker anymore and today bent over and felt a pop and pain in her left hip which is now improving but she is now pain-free yet. She was not able to bear weight on her left hip when she felt a pop and came to the ED for evaluation. Her pain is improving she is able to move her left hip actively and passively now. She did not fall and she had no syncope no lightheadedness no head or neck injury no neck pain no chest pain no shortness of breath no abdominal pain no weakness no numbness no other concerns. She is no pain to her back or her left knee ankle or foot. She is isolated moderately severe left hip pain only worse with attempted weightbearing without radiation or associated symptoms. Her pain is constant but gradually improving since starting just prior to arrival. Past Medical History  Diagnosis Date  . Diabetes mellitus   . Hyperlipidemia   . Chronic kidney disease   . Depression   . Allergy   . Hypertension   . Vasovagal syncope 2006    Negative cardiac workup-myoview, echo  . Diverticula, colon   . Family history of anesthesia complication     Son is difficult intubation  . Complication of anesthesia     difficult waking   . Arthritis     osteoarthritis    Past Surgical History  Procedure Laterality Date  . Abdominal hysterectomy  1977    fibroid  . Joint replacement  2009    rt hip  . Total hip arthroplasty Left 05/12/2014    dr Mayer Camel  . Total hip arthroplasty Left 05/12/2014    Procedure: TOTAL HIP ARTHROPLASTY;   Surgeon: Kerin Salen, MD;  Location: [facility];  Service: Orthopedics;  Laterality: Left;   Family History  Problem Relation Age of Onset  . COPD Brother   . Heart disease Brother   . Diabetes Brother   . Lymphoma Brother   . Alzheimer's disease Brother   . Heart disease Mother   . Pancreatic cancer Sister   . Alzheimer's disease Sister   . Breast cancer Sister   . Leukemia Other    History  Substance Use Topics  . Smoking status: Former Smoker -- 1.00 packs/day for 45 years    Types: Cigarettes    Quit date: 06/16/1999  . Smokeless tobacco: Never Used  . Alcohol Use: No   OB History    No data available     Review of Systems 10 Systems reviewed and are negative for acute change except as noted in the HPI.   Allergies  Lovastatin; Niacin; Statins; and Codeine  Home Medications   Prior to Admission medications   Medication Sig Start Date End Date Taking? Authorizing Provider  aspirin EC 325 MG tablet Take 1 tablet (325 mg total) by mouth 2 (two) times daily. 05/12/14   Leighton Parody, PA-C  cefUROXime (CEFTIN) 250 MG tablet Take 1 tablet (250 mg total) by mouth 2 (two) times daily with a meal. 05/19/14  Barton Dubois, MD  citalopram (CELEXA) 40 MG tablet Take 1 tablet (40 mg total) by mouth daily. 10/31/13   Amy Cletis Athens, MD  Cyanocobalamin (VITAMIN B 12 PO) Take 1 tablet by mouth daily.    Historical Provider, MD  fenofibrate 54 MG tablet Take 108 mg by mouth daily.    Historical Provider, MD  glucose blood (BLOOD GLUCOSE TEST STRIPS) test strip One Touch-Use as directed-2 to 3 times a day     Historical Provider, MD  HYDROcodone-acetaminophen (NORCO) 5-325 MG per tablet Take 1 tablet by mouth every 4 (four) hours as needed for severe pain. 06/09/14   Babette Relic, MD  metFORMIN (GLUCOPHAGE) 500 MG tablet Take 500 mg by mouth daily.     Historical Provider, MD  omega-3 acid ethyl esters (LOVAZA) 1 G capsule Take 1 capsule (1 g total) by mouth 2 (two) times daily. 05/19/14    Barton Dubois, MD   BP 123/47 mmHg  Pulse 83  Temp(Src) 98.1 F (36.7 C) (Oral)  Resp 20  SpO2 96% Physical Exam  Nursing note and vitals reviewed. Constitutional:  Awake, alert, nontoxic appearance.  HENT:  Head: Atraumatic.  Eyes: Right eye exhibits no discharge. Left eye exhibits no discharge.  Neck: Neck supple.  Cervical spine nontender  Cardiovascular: Normal rate and regular rhythm.   No murmur heard. Pulmonary/Chest: Effort normal and breath sounds normal. No respiratory distress. She has no wheezes. She has no rales. She exhibits no tenderness.  Pulse oximetry normal on room air 98%  Abdominal: Soft. There is no tenderness. There is no rebound.  Musculoskeletal: She exhibits tenderness.  Baseline ROM, no obvious new focal weakness. Left leg nontender at the foot ankle and knee with dorsalis pedis pulse intact normal light touch to her foot with capillary refill less than 2 seconds good movement to her foot ankle and knee. Her left hip is mildly tender laterally with mildly decreased range of motion limited by pain. Her left thigh and left calf are nontender without edema.  Neurological: She is alert.  Mental status and motor strength appears baseline for patient and situation.  Skin: No rash noted.  Psychiatric: She has a normal mood and affect.    ED Course  Procedures (including critical care time) Labs Review Labs Reviewed - No data to display  Imaging Review No results found. Dg Hip Complete Left  06/09/2014   CLINICAL DATA:  Acute left hip pain.  EXAM: LEFT HIP - COMPLETE 2+ VIEW  COMPARISON:  None.  FINDINGS: Status post bilateral total hip arthroplasties. No fracture or dislocation is noted.  IMPRESSION: Status post bilateral total hip arthroplasties. No fracture or dislocation is noted.   Electronically Signed   By: Sabino Dick M.D.   On: 06/09/2014 18:08    EKG Interpretation None      MDM   Final diagnoses:  Left hip pain    Patient / Family /  Caregiver informed of clinical course, understand medical decision-making process, and agree with plan. I doubt any other EMC precluding discharge at this time including, but not necessarily limited to the following:hip dislocation.    Babette Relic, MD 06/22/14 2019

## 2014-06-09 NOTE — ED Notes (Signed)
MD at bedside. 

## 2014-06-09 NOTE — Discharge Instructions (Signed)
Use your walker for safety and ambulate with assistance from your family with weightbearing as tolerated on left leg.  Return sooner to the emergency department if you develop uncontrolled pain, unable to bear any weight, new weakness or numbness, new swelling or color change to your leg, or other concerns.

## 2014-09-05 ENCOUNTER — Other Ambulatory Visit (HOSPITAL_COMMUNITY): Payer: Self-pay | Admitting: Orthopedic Surgery

## 2014-09-05 DIAGNOSIS — M25551 Pain in right hip: Secondary | ICD-10-CM

## 2014-09-24 ENCOUNTER — Other Ambulatory Visit (HOSPITAL_COMMUNITY): Payer: Self-pay | Admitting: Orthopedic Surgery

## 2014-09-24 ENCOUNTER — Ambulatory Visit (HOSPITAL_COMMUNITY)
Admission: RE | Admit: 2014-09-24 | Discharge: 2014-09-24 | Disposition: A | Discharge status: Home / Self Care | Payer: Medicare PPO | Source: Ambulatory Visit | Attending: Orthopedic Surgery | Admitting: Orthopedic Surgery

## 2014-09-24 DIAGNOSIS — Z96641 Presence of right artificial hip joint: Secondary | ICD-10-CM | POA: Diagnosis not present

## 2014-09-24 DIAGNOSIS — R531 Weakness: Secondary | ICD-10-CM | POA: Diagnosis not present

## 2014-09-24 DIAGNOSIS — M25551 Pain in right hip: Secondary | ICD-10-CM | POA: Diagnosis present

## 2014-12-08 ENCOUNTER — Other Ambulatory Visit: Payer: Self-pay | Admitting: Family Medicine

## 2015-01-05 ENCOUNTER — Ambulatory Visit: Payer: Medicare PPO | Admitting: Family Medicine

## 2015-01-17 ENCOUNTER — Ambulatory Visit (INDEPENDENT_AMBULATORY_CARE_PROVIDER_SITE_OTHER): Payer: Medicare HMO | Admitting: Emergency Medicine

## 2015-01-17 VITALS — BP 140/60 | HR 96 | Temp 98.3°F | Ht 62.5 in | Wt 170.0 lb

## 2015-01-17 DIAGNOSIS — L501 Idiopathic urticaria: Secondary | ICD-10-CM | POA: Diagnosis not present

## 2015-01-17 MED ORDER — PREDNISONE 10 MG (21) PO TBPK: ORAL_TABLET | ORAL | Status: DC

## 2015-01-17 NOTE — Progress Notes (Signed)
Subjective:  Patient ID: Jill [name], female    DOB: October 15, 1931  Age: 81 y.o. MRN: [id number]  CC: Urticaria   HPI CANDASE FRUM presents for evaluation treatment of her chronic idiopathic urticaria. She has been in remission for a good period time and now today has noted that she has hives on her hands and legs and lower abdomen. She has no new allergy exposure No fever chills cough coryza or other complaints. She denies any new personal care products or medication.  She's obtained no improvement with over-the-counter medication. Past she's been treated with prednisone. She experienced good relief with. History Verenise has a past medical history of Diabetes mellitus; Hyperlipidemia; Chronic kidney disease; Depression; Allergy; Hypertension; Vasovagal syncope (2006); Diverticula, colon; Family history of anesthesia complication; Complication of anesthesia; and Arthritis.   She has past surgical history that includes Abdominal hysterectomy (1977); Joint replacement (2009); Total hip arthroplasty (Left, 05/12/2014); and Total hip arthroplasty (Left, 05/12/2014).   Her  family history includes Alzheimer's disease in her brother and sister; Breast cancer in her sister; COPD in her brother; Diabetes in her brother; Heart disease in her brother and mother; Leukemia in her other; Lymphoma in her brother; Pancreatic cancer in her sister.  She   reports that she quit smoking about 15 years ago. Her smoking use included Cigarettes. She has a 45 pack-year smoking history. She has never used smokeless tobacco. She reports that she does not drink alcohol or use illicit drugs.  Outpatient Prescriptions Prior to Visit  Medication Sig Dispense Refill  . aspirin EC 325 MG tablet Take 1 tablet (325 mg total) by mouth 2 (two) times daily. 30 tablet 0  . citalopram (CELEXA) 40 MG tablet Take 1 tablet (40 mg total) by mouth daily. 90 tablet 3  . fenofibrate 54 MG tablet TAKE TWO (2) TABLETS BY MOUTH DAILY  180 tablet 1  . glucose blood (BLOOD GLUCOSE TEST STRIPS) test strip One Touch-Use as directed-2 to 3 times a day     . metFORMIN (GLUCOPHAGE) 500 MG tablet Take 500 mg by mouth daily.     . cefUROXime (CEFTIN) 250 MG tablet Take 1 tablet (250 mg total) by mouth 2 (two) times daily with a meal. (Patient not taking: Reported on 01/17/2015) 10 tablet 0  . Cyanocobalamin (VITAMIN B 12 PO) Take 1 tablet by mouth daily.    Marland Kitchen HYDROcodone-acetaminophen (NORCO) 5-325 MG per tablet Take 1 tablet by mouth every 4 (four) hours as needed for severe pain. (Patient not taking: Reported on 01/17/2015) 12 tablet 0  . omega-3 acid ethyl esters (LOVAZA) 1 G capsule Take 1 capsule (1 g total) by mouth 2 (two) times daily. (Patient not taking: Reported on 01/17/2015) 60 capsule 1   No facility-administered medications prior to visit.    History   Social History  . Marital Status: Widowed    Spouse Name: N/A  . Number of Children: N/A  . Years of Education: N/A   Social History Main Topics  . Smoking status: Former Smoker -- 1.00 packs/day for 45 years    Types: Cigarettes    Quit date: 06/16/1999  . Smokeless tobacco: Never Used  . Alcohol Use: No  . Drug Use: No  . Sexual Activity: Not Currently   Other Topics Concern  . 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 Diet: healthy diet      Son Antony H[place]) [place] , has living will and DNR ( reviewed 2015)                 Review of Systems  Constitutional: Negative for fever, chills and appetite change.  HENT: Negative for congestion, drooling, ear pain, postnasal drip, rhinorrhea, sinus pressure, sore throat, trouble swallowing and voice change.   Eyes: Negative for pain and redness.    Respiratory: Negative for cough, chest tightness, shortness of breath and wheezing.   Cardiovascular: Negative for leg swelling.  Gastrointestinal: Negative for nausea, vomiting, abdominal pain, diarrhea, constipation and blood in stool.  Endocrine: Negative for polyuria.  Genitourinary: Negative for dysuria, urgency, frequency and flank pain.  Musculoskeletal: Negative for gait problem.  Skin: Positive for rash.  Neurological: Negative for weakness and headaches.  Psychiatric/Behavioral: Negative for confusion and decreased concentration. The patient is not nervous/anxious.     Objective:  BP 140/60 mmHg  Pulse 96  Temp(Src) 98.3 F (36.8 C) (Oral)  Ht 5' 2.5" (1.588 m)  Wt 170 lb (77.111 kg)  BMI 30.58 kg/m2  SpO2 95%  Physical Exam  Constitutional: She is oriented to person, place, and time. She appears well-developed and well-nourished. No distress.  HENT:  Head: Normocephalic and atraumatic.  Right Ear: External ear normal.  Left Ear: External ear normal.  Nose: Nose normal.  Eyes: Conjunctivae and EOM are normal. Pupils are equal, round, and reactive to light. No scleral icterus.  Neck: Normal range of motion. Neck supple. No tracheal deviation present.  Cardiovascular: Normal rate, regular rhythm and normal heart sounds.   Pulmonary/Chest: Effort normal. No respiratory distress. She has no wheezes. She has no rales.  Abdominal: She exhibits no mass. There is no tenderness. There is no rebound and no guarding.  Musculoskeletal: She exhibits no edema.  Lymphadenopathy:    She has no cervical adenopathy.  Neurological: She is alert and oriented to person, place, and time. Coordination normal.  Skin: Skin is warm and dry. Rash noted.  Psychiatric: She has a normal mood and affect. Her behavior is normal.      Assessment & Plan:   Dellia was seen today for urticaria.  Diagnoses and all orders for this visit:  Chronic idiopathic urticaria  Other orders -      predniSONE (STERAPRED UNI-PAK 21 TAB) 10 MG (21) TBPK tablet; As directed on package   I am having Ms. Foulkes start on predniSONE. I am also having her maintain her glucose blood, citalopram, metFORMIN, Cyanocobalamin (VITAMIN B 12 PO), aspirin EC, cefUROXime, omega-3 acid ethyl esters, HYDROcodone-acetaminophen, and fenofibrate.  Meds ordered this encounter  Medications  . predniSONE (STERAPRED UNI-PAK 21 TAB) 10 MG (21) TBPK tablet    Sig: As directed on package    Dispense:  21 tablet    Refill:  0   She was advised to pay careful attention to her blood sugar while taking the prednisone. Her dermatologist.  Follow-up: Return if symptoms worsen or fail to improve.  Roselee Culver, MD

## 2015-01-17 NOTE — Patient Instructions (Signed)
Hives Hives are itchy, red, swollen areas of the skin. They can vary in size and location on your body. Hives can come and go for hours or several days (acute hives) or for several weeks (chronic hives). Hives do not spread from person to person (noncontagious). They may get worse with scratching, exercise, and emotional stress. CAUSES   Allergic reaction to food, additives, or drugs.  Infections, including the common cold.  Illness, such as vasculitis, lupus, or thyroid disease.  Exposure to sunlight, heat, or cold.  Exercise.  Stress.  Contact with chemicals. SYMPTOMS   Red or white swollen patches on the skin. The patches may change size, shape, and location quickly and repeatedly.  Itching.  Swelling of the hands, feet, and face. This may occur if hives develop deeper in the skin. DIAGNOSIS  Your caregiver can usually tell what is wrong by performing a physical exam. Skin or blood tests may also be done to determine the cause of your hives. In some cases, the cause cannot be determined. TREATMENT  Mild cases usually get better with medicines such as antihistamines. Severe cases may require an emergency epinephrine injection. If the cause of your hives is known, treatment includes avoiding that trigger.  HOME CARE INSTRUCTIONS   Avoid causes that trigger your hives.  Take antihistamines as directed by your caregiver to reduce the severity of your hives. Non-sedating or low-sedating antihistamines are usually recommended. Do not drive while taking an antihistamine.  Take any other medicines prescribed for itching as directed by your caregiver.  Wear loose-fitting clothing.  Keep all follow-up appointments as directed by your caregiver. SEEK MEDICAL CARE IF:   You have persistent or severe itching that is not relieved with medicine.  You have painful or swollen joints. SEEK IMMEDIATE MEDICAL CARE IF:   You have a fever.  Your tongue or lips are swollen.  You have  trouble breathing or swallowing.  You feel tightness in the throat or chest.  You have abdominal pain. These problems may be the first sign of a life-threatening allergic reaction. Call your local emergency services (911 in [place] MAKE SURE YOU:   Understand these instructions.  Will watch your condition.  Will get help right away if you are not doing well or get worse. Document Released: 08/08/2005 Document Revised: 08/13/2013 Document Reviewed: 11/01/2011 Exit[employer] Patient Information 2015 [employer]. This information is not intended to replace advice given to you by your health care provider. Make sure you discuss any questions you have with your health care provider.  

## 2015-01-20 ENCOUNTER — Encounter: Payer: Self-pay | Admitting: Family Medicine

## 2015-01-22 ENCOUNTER — Encounter: Payer: Self-pay | Admitting: Family Medicine

## 2015-01-22 ENCOUNTER — Ambulatory Visit (INDEPENDENT_AMBULATORY_CARE_PROVIDER_SITE_OTHER): Payer: Medicare PPO | Admitting: Family Medicine

## 2015-01-22 VITALS — BP 128/80 | HR 77 | Temp 98.4°F | Ht 62.5 in | Wt 164.5 lb

## 2015-01-22 DIAGNOSIS — F331 Major depressive disorder, recurrent, moderate: Secondary | ICD-10-CM

## 2015-01-22 DIAGNOSIS — R5382 Chronic fatigue, unspecified: Secondary | ICD-10-CM | POA: Insufficient documentation

## 2015-01-22 DIAGNOSIS — R5383 Other fatigue: Secondary | ICD-10-CM | POA: Diagnosis not present

## 2015-01-22 DIAGNOSIS — L508 Other urticaria: Secondary | ICD-10-CM | POA: Diagnosis not present

## 2015-01-22 DIAGNOSIS — F32A Depression, unspecified: Secondary | ICD-10-CM

## 2015-01-22 DIAGNOSIS — F329 Major depressive disorder, single episode, unspecified: Secondary | ICD-10-CM | POA: Diagnosis not present

## 2015-01-22 MED ORDER — ALPRAZOLAM 0.25 MG PO TABS: 0.2500 mg | ORAL_TABLET | Freq: Two times a day (BID) | ORAL | Status: DC | PRN

## 2015-01-22 MED ORDER — ESCITALOPRAM OXALATE 20 MG PO TABS: 20.0000 mg | ORAL_TABLET | Freq: Every day | ORAL | Status: DC

## 2015-01-22 NOTE — Assessment & Plan Note (Signed)
She has had extensive work up in past. Seems to be related to heat and possibly anxiety. Improved with steroid as well as alprazolam low dose.  Will treat mood, if not improving consider repeat course of prednisone longer and slower.

## 2015-01-22 NOTE — Patient Instructions (Addendum)
Stop celexa. Start escitalopram 20 mg  at bedtime. Can use very low does alprazolam for hive/anxiety every few days as needed.

## 2015-01-22 NOTE — Progress Notes (Signed)
Pre visit review using our clinic review tool, if applicable. No additional management support is needed unless otherwise documented below in the visit note. 

## 2015-01-22 NOTE — Progress Notes (Signed)
Subjective:    Patient ID: Jill [name], female    DOB: 1932-02-04, 81 y.o.   MRN: [id number]  HPI  81 year old female with history of depression, recurrent chronic idiopathic urticaria, DM, hx of   Hospitalization for TIA vs encephalopathy from UTI in 04/2014 after hip surgerypresents with  Several concerns.   1.Fatigue/weakness for several months, associated with good and hives. Improved with prednisone. No vertigo. No blood in stool, in urine.  2. Urticaria:  Seen on 01/17/2015 at urgent care for 1-2  months of hives. She was treated with  Prednisone taper. Previously seen by [facility], last OV several years a go.. Tried several antihistamines, 3 at one time.. No benefit.  Only new exposure was going to gym, getting hot a sweaty. Also seems to be related to stress.   She has had recurrence on hand on right inner thigh once off prednisone.  Daughter also gave her a 1/2 dose of alprazolam.. Seems to make them go away.  3. Depression, recurrent major worsening given loss of several family members including spouse in last year. April is the anniversary. On celexa for years. Was very helpful at start. She has been more anxious and depressed in last few months. She endorses anhedonia and decreased motivation. Trouble falling asleep.  Review of Systems  Constitutional: Positive for fatigue. Negative for fever.  HENT: Negative for ear pain.   Eyes: Negative for pain.  Respiratory: Negative for cough and shortness of breath.   Cardiovascular: Positive for leg swelling. Negative for chest pain and palpitations.  Gastrointestinal: Negative for abdominal pain.  Genitourinary: Negative for dysuria.  Psychiatric/Behavioral: Negative for suicidal ideas and sleep disturbance. The patient is nervous/anxious.        Objective:   Physical Exam  Constitutional: Vital signs are normal. She appears well-developed and well-nourished. She is cooperative.  Non-toxic appearance. She does not appear ill.  No distress.  HENT:  Head: Normocephalic.  Right Ear: Hearing, tympanic membrane, external ear and ear canal normal. Tympanic membrane is not erythematous, not retracted and not bulging.  Left Ear: Hearing, tympanic membrane, external ear and ear canal normal. Tympanic membrane is not erythematous, not retracted and not bulging.  Nose: No mucosal edema or rhinorrhea. Right sinus exhibits no maxillary sinus tenderness and no frontal sinus tenderness. Left sinus exhibits no maxillary sinus tenderness and no frontal sinus tenderness.  Mouth/Throat: Uvula is midline, oropharynx is clear and moist and mucous membranes are normal.  Eyes: Conjunctivae, EOM and lids are normal. Pupils are equal, round, and reactive to light. Lids are everted and swept, no foreign bodies found.  Neck: Trachea normal and normal range of motion. Neck supple. Carotid bruit is not present. No thyroid mass and no thyromegaly present.  Cardiovascular: Normal rate, regular rhythm, S1 normal, S2 normal, normal heart sounds, intact distal pulses and normal pulses.  Exam reveals no gallop and no friction rub.   No murmur heard. Pulmonary/Chest: Effort normal and breath sounds normal. No tachypnea. No respiratory distress. She has no decreased breath sounds. She has no wheezes. She has no rhonchi. She has no rales.  Abdominal: Soft. Normal appearance and bowel sounds are normal. There is no tenderness.  Neurological: She is alert.  Skin: Skin is warm, dry and intact. No rash noted.  Wheals red on left palm and right inner thigh.  Psychiatric: Her speech is normal and behavior is normal. Judgment and thought content normal. Her mood appears not anxious. Cognition and memory are  normal. She does not exhibit a depressed mood.          Assessment & Plan:

## 2015-01-22 NOTE — Assessment & Plan Note (Signed)
If not improving with treatment of hives and depression/anxiety, consider lab eval.

## 2015-01-22 NOTE — Assessment & Plan Note (Signed)
Poor control. Change clexa to lexapro at bedtime. Close follow up in 1 month.  Will temporarily use occ low dose alprazolam for anxiety.

## 2015-01-26 ENCOUNTER — Encounter (HOSPITAL_COMMUNITY): Payer: Self-pay | Admitting: Emergency Medicine

## 2015-01-26 ENCOUNTER — Inpatient Hospital Stay (HOSPITAL_COMMUNITY)
Admission: EM | Admit: 2015-01-26 | Discharge: 2015-02-01 | DRG: 871 | Disposition: A | Discharge status: Home / Self Care | Payer: Medicare PPO | Attending: Internal Medicine | Admitting: Internal Medicine

## 2015-01-26 DIAGNOSIS — Z882 Allergy status to sulfonamides status: Secondary | ICD-10-CM

## 2015-01-26 DIAGNOSIS — I129 Hypertensive chronic kidney disease with stage 1 through stage 4 chronic kidney disease, or unspecified chronic kidney disease: Secondary | ICD-10-CM | POA: Diagnosis present

## 2015-01-26 DIAGNOSIS — N390 Urinary tract infection, site not specified: Secondary | ICD-10-CM | POA: Diagnosis present

## 2015-01-26 DIAGNOSIS — K449 Diaphragmatic hernia without obstruction or gangrene: Secondary | ICD-10-CM | POA: Diagnosis not present

## 2015-01-26 DIAGNOSIS — K5730 Diverticulosis of large intestine without perforation or abscess without bleeding: Secondary | ICD-10-CM | POA: Diagnosis present

## 2015-01-26 DIAGNOSIS — Z87891 Personal history of nicotine dependence: Secondary | ICD-10-CM | POA: Diagnosis not present

## 2015-01-26 DIAGNOSIS — N12 Tubulo-interstitial nephritis, not specified as acute or chronic: Secondary | ICD-10-CM | POA: Diagnosis not present

## 2015-01-26 DIAGNOSIS — Z90710 Acquired absence of both cervix and uterus: Secondary | ICD-10-CM | POA: Diagnosis not present

## 2015-01-26 DIAGNOSIS — E785 Hyperlipidemia, unspecified: Secondary | ICD-10-CM | POA: Diagnosis present

## 2015-01-26 DIAGNOSIS — N179 Acute kidney failure, unspecified: Secondary | ICD-10-CM | POA: Diagnosis present

## 2015-01-26 DIAGNOSIS — N151 Renal and perinephric abscess: Secondary | ICD-10-CM | POA: Diagnosis present

## 2015-01-26 DIAGNOSIS — A4151 Sepsis due to Escherichia coli [E. coli]: Principal | ICD-10-CM | POA: Diagnosis present

## 2015-01-26 DIAGNOSIS — Z8249 Family history of ischemic heart disease and other diseases of the circulatory system: Secondary | ICD-10-CM

## 2015-01-26 DIAGNOSIS — E119 Type 2 diabetes mellitus without complications: Secondary | ICD-10-CM | POA: Diagnosis not present

## 2015-01-26 DIAGNOSIS — Z885 Allergy status to narcotic agent status: Secondary | ICD-10-CM | POA: Diagnosis not present

## 2015-01-26 DIAGNOSIS — Z96643 Presence of artificial hip joint, bilateral: Secondary | ICD-10-CM | POA: Diagnosis present

## 2015-01-26 DIAGNOSIS — N2889 Other specified disorders of kidney and ureter: Secondary | ICD-10-CM | POA: Diagnosis not present

## 2015-01-26 DIAGNOSIS — Z7982 Long term (current) use of aspirin: Secondary | ICD-10-CM | POA: Diagnosis not present

## 2015-01-26 DIAGNOSIS — E1140 Type 2 diabetes mellitus with diabetic neuropathy, unspecified: Secondary | ICD-10-CM

## 2015-01-26 DIAGNOSIS — L501 Idiopathic urticaria: Secondary | ICD-10-CM | POA: Diagnosis present

## 2015-01-26 DIAGNOSIS — R109 Unspecified abdominal pain: Secondary | ICD-10-CM

## 2015-01-26 DIAGNOSIS — E0843 Diabetes mellitus due to underlying condition with diabetic autonomic (poly)neuropathy: Secondary | ICD-10-CM

## 2015-01-26 DIAGNOSIS — N16 Renal tubulo-interstitial disorders in diseases classified elsewhere: Secondary | ICD-10-CM | POA: Diagnosis present

## 2015-01-26 DIAGNOSIS — Z888 Allergy status to other drugs, medicaments and biological substances status: Secondary | ICD-10-CM

## 2015-01-26 DIAGNOSIS — Z79899 Other long term (current) drug therapy: Secondary | ICD-10-CM

## 2015-01-26 DIAGNOSIS — N189 Chronic kidney disease, unspecified: Secondary | ICD-10-CM

## 2015-01-26 DIAGNOSIS — Z833 Family history of diabetes mellitus: Secondary | ICD-10-CM | POA: Diagnosis not present

## 2015-01-26 DIAGNOSIS — M1990 Unspecified osteoarthritis, unspecified site: Secondary | ICD-10-CM | POA: Diagnosis present

## 2015-01-26 DIAGNOSIS — F329 Major depressive disorder, single episode, unspecified: Secondary | ICD-10-CM | POA: Diagnosis present

## 2015-01-26 DIAGNOSIS — N289 Disorder of kidney and ureter, unspecified: Secondary | ICD-10-CM

## 2015-01-26 DIAGNOSIS — N183 Chronic kidney disease, stage 3 (moderate): Secondary | ICD-10-CM | POA: Diagnosis present

## 2015-01-26 DIAGNOSIS — E1122 Type 2 diabetes mellitus with diabetic chronic kidney disease: Secondary | ICD-10-CM | POA: Diagnosis present

## 2015-01-26 LAB — I-STAT CHEM 8, ED
BUN: 33 mg/dL — AB (ref 6–20)
CALCIUM ION: 1.14 mmol/L (ref 1.13–1.30)
Chloride: 104 mmol/L (ref 101–111)
Creatinine, Ser: 1.9 mg/dL — ABNORMAL HIGH (ref 0.44–1.00)
Glucose, Bld: 208 mg/dL — ABNORMAL HIGH (ref 65–99)
HCT: 35 % — ABNORMAL LOW (ref 36.0–46.0)
Hemoglobin: 11.9 g/dL — ABNORMAL LOW (ref 12.0–15.0)
POTASSIUM: 4.1 mmol/L (ref 3.5–5.1)
Sodium: 137 mmol/L (ref 135–145)
TCO2: 20 mmol/L (ref 0–100)

## 2015-01-26 LAB — CBC WITH DIFFERENTIAL/PLATELET
Basophils Absolute: 0 10*3/uL (ref 0.0–0.1)
Basophils Relative: 0 % (ref 0–1)
EOS ABS: 0.1 10*3/uL (ref 0.0–0.7)
Eosinophils Relative: 1 % (ref 0–5)
HEMATOCRIT: 34.2 % — AB (ref 36.0–46.0)
Hemoglobin: 10.8 g/dL — ABNORMAL LOW (ref 12.0–15.0)
LYMPHS PCT: 16 % (ref 12–46)
Lymphs Abs: 3 10*3/uL (ref 0.7–4.0)
MCH: 27.5 pg (ref 26.0–34.0)
MCHC: 31.6 g/dL (ref 30.0–36.0)
MCV: 87 fL (ref 78.0–100.0)
MONOS PCT: 7 % (ref 3–12)
Monocytes Absolute: 1.2 10*3/uL — ABNORMAL HIGH (ref 0.1–1.0)
NEUTROS PCT: 76 % (ref 43–77)
Neutro Abs: 14.1 10*3/uL — ABNORMAL HIGH (ref 1.7–7.7)
Platelets: 400 10*3/uL (ref 150–400)
RBC: 3.93 MIL/uL (ref 3.87–5.11)
RDW: 14.6 % (ref 11.5–15.5)
WBC: 18.5 10*3/uL — ABNORMAL HIGH (ref 4.0–10.5)

## 2015-01-26 MED ORDER — ACETAMINOPHEN 325 MG PO TABS
650.0000 mg | ORAL_TABLET | Freq: Once | ORAL | Status: AC
  Administered 2015-01-26: 650 mg via ORAL
  Filled 2015-01-26: qty 2

## 2015-01-26 NOTE — ED Notes (Signed)
Pt to ED with c/o vomiting and elevated temp,  Pt denies any pain, c/o feeling weak

## 2015-01-26 NOTE — ED Provider Notes (Signed)
CSN: [id number]     Arrival date & time 01/26/15  2147 History  This chart was scribed for Jill Hacker, MD by Rayfield Citizen, ED Scribe. This patient was seen in room A12C/A12C and the patient's care was started at 11:43 PM.    Chief Complaint  Patient presents with  . Emesis   The history is provided by the patient. No language interpreter was used.     HPI Comments: Jill [name] is a 81 y.o. female with past medical history of DM, HLD, HTN, chronic kidney disease, diverticula who presents to the Emergency Department complaining of sudden onset fever (TMAX PTA 104), chills, and vomiting beginning this evening around 20:30. Patient's family members report coughing beginning tonight; they also note two weeks of generalized weakness and fatigue. Patient denies abdominal pain, diarrhea, dysuria, hematuria. She reports that her symptoms have improved after tylenol in the ED. She denies any history of heart failure.  Family states medications were changed from citalopram to levapro recently. They also note removing a tick from the patient's right buttock several weeks ago. Patient recently had generalized hives; she states she has chronic hives and denies any recent new or unusual rashes.   Past Medical History  Diagnosis Date  . Diabetes mellitus   . Hyperlipidemia   . Chronic kidney disease   . Depression   . Allergy   . Hypertension   . Vasovagal syncope 2006    Negative cardiac workup-myoview, echo  . Diverticula, colon   . Family history of anesthesia complication     Son is difficult intubation  . Complication of anesthesia     difficult waking   . Arthritis     osteoarthritis    Past Surgical History  Procedure Laterality Date  . Abdominal hysterectomy  1977    fibroid  . Joint replacement  2009    rt hip  . Total hip arthroplasty Left 05/12/2014    dr Mayer Camel  . Total hip arthroplasty Left 05/12/2014    Procedure: TOTAL HIP ARTHROPLASTY;  Surgeon: Kerin Salen, MD;   Location: [place];  Service: Orthopedics;  Laterality: Left;   Family History  Problem Relation Age of Onset  . COPD Brother   . Heart disease Brother   . Diabetes Brother   . Lymphoma Brother   . Alzheimer's disease Brother   . Heart disease Mother   . Pancreatic cancer Sister   . Alzheimer's disease Sister   . Breast cancer Sister   . Leukemia Other    History  Substance Use Topics  . Smoking status: Former Smoker -- 1.00 packs/day for 45 years    Types: Cigarettes    Quit date: 06/16/1999  . Smokeless tobacco: Never Used  . Alcohol Use: No   OB History    No data available     Review of Systems  Constitutional: Positive for fever and chills.  Respiratory: Positive for cough. Negative for chest tightness and shortness of breath.   Cardiovascular: Negative for chest pain.  Gastrointestinal: Positive for nausea and vomiting. Negative for abdominal pain and diarrhea.  Genitourinary: Negative for dysuria.  Musculoskeletal: Negative for back pain.  Skin: Negative for rash and wound.  Neurological: Negative for headaches.  Psychiatric/Behavioral: Negative for confusion.  All other systems reviewed and are negative.     Allergies  Lovastatin; Niacin; Statins; and Codeine  Home Medications   Prior to Admission medications   Medication Sig Start Date End Date Taking? Authorizing Provider  ALPRAZolam (  XANAX) 0.25 MG tablet Take 1 tablet (0.25 mg total) by mouth 2 (two) times daily as needed for anxiety. 01/22/15   Amy Cletis Athens, MD  aspirin EC 325 MG tablet Take 1 tablet (325 mg total) by mouth 2 (two) times daily. 05/12/14   Leighton Parody, PA-C  Cyanocobalamin (VITAMIN B 12 PO) Take 1 tablet by mouth daily.    Historical Provider, MD  escitalopram (LEXAPRO) 20 MG tablet Take 1 tablet (20 mg total) by mouth daily. 01/22/15   Amy Cletis Athens, MD  fenofibrate 54 MG tablet TAKE TWO (2) TABLETS BY MOUTH DAILY 12/09/14   Amy Cletis Athens, MD  metFORMIN (GLUCOPHAGE) 500 MG tablet Take  500 mg by mouth daily.     Historical Provider, MD   BP 135/51 mmHg  Pulse 83  Temp(Src) 101.4 F (38.6 C) (Oral)  Resp 20  SpO2 96% Physical Exam  Constitutional: She is oriented to person, place, and time. No distress.  HENT:  Head: Normocephalic and atraumatic.  Eyes: Pupils are equal, round, and reactive to light.  Neck: Neck supple.  Cardiovascular: Normal rate, regular rhythm and normal heart sounds.   No murmur heard. Pulmonary/Chest: Effort normal and breath sounds normal. No respiratory distress. She has no wheezes.  Abdominal: Soft. Bowel sounds are normal. There is no tenderness. There is no rebound.  Neurological: She is alert and oriented to person, place, and time.  Skin: Skin is warm and dry. No rash noted.  Psychiatric: She has a normal mood and affect.  Nursing note and vitals reviewed.   ED Course  Procedures   DIAGNOSTIC STUDIES: Oxygen Saturation is 94% on RA, low by my interpretation.    COORDINATION OF CARE: 11:48 PM Discussed treatment plan with pt at bedside and pt agreed to plan.   Labs Review Labs Reviewed  CBC WITH DIFFERENTIAL/PLATELET - Abnormal; Notable for the following:    WBC 18.5 (*)    Hemoglobin 10.8 (*)    HCT 34.2 (*)    Neutro Abs 14.1 (*)    Monocytes Absolute 1.2 (*)    All other components within normal limits  URINALYSIS, ROUTINE W REFLEX MICROSCOPIC (NOT AT [facility]) - Abnormal; Notable for the following:    Nitrite POSITIVE (*)    Leukocytes, UA MODERATE (*)    All other components within normal limits  URINE MICROSCOPIC-ADD ON - Abnormal; Notable for the following:    Bacteria, UA MANY (*)    All other components within normal limits  I-STAT CHEM 8, ED - Abnormal; Notable for the following:    BUN 33 (*)    Creatinine, Ser 1.90 (*)    Glucose, Bld 208 (*)    Hemoglobin 11.9 (*)    HCT 35.0 (*)    All other components within normal limits  URINE CULTURE  CULTURE, BLOOD (ROUTINE X 2)  CULTURE, BLOOD (ROUTINE X 2)   I-STAT CG4 LACTIC ACID, ED  I-STAT CG4 LACTIC ACID, ED    Imaging Review Dg Abd Acute W/chest  01/27/2015   CLINICAL DATA:  Emesis today.  Fever of 104.  EXAM: DG ABDOMEN ACUTE W/ 1V CHEST  COMPARISON:  None.  FINDINGS: There is no evidence of dilated bowel loops or free intraperitoneal air. Mildly prominent mid abdominal small bowel loops are nondistended. No radiopaque calculi or other significant radiographic abnormality is seen. Heart size and mediastinal contours are within normal limits. Both lungs are clear. Calcified tortuous aorta. Moderate stool burden. BILATERAL hip replacements. Lumbar spine degenerative  disc disease.  IMPRESSION: Mildly prominent mid abdominal small bowel loops without obstruction. No visible free air.  No active cardiopulmonary disease.   Electronically Signed   By: Rolla Flatten M.D.   On: 01/27/2015 01:15     EKG Interpretation None      MDM   Final diagnoses:  UTI (lower urinary tract infection)   Patient presents with chills, fever, and emesis. Reports that she feels better after receiving Tylenol in the waiting room. Vital signs notable for tachycardia and fever to 101.4. Physical exam is largely unremarkable. Workup notable for nitrite-positive urine with 3-6 white cells and many bacteria. Patient given IV Rocephin. Urine and blood cultures pending.  Patient given normal saline bolus. White blood count of 18.5.  Acute abdominal spirits series largely reassuring with only mild prominent small bowel loops. Patient does not have any diarrhea. Suspect vomiting likely secondary to acute urinary tract infection. Given age and evidence of early urosepsis, will admit for IV antibiotics.   I personally performed the services described in this documentation, which was scribed in my presence. The recorded information has been reviewed and is accurate.      Jill Hacker, MD 01/27/15 [phone]

## 2015-01-27 ENCOUNTER — Encounter (HOSPITAL_COMMUNITY): Payer: Self-pay

## 2015-01-27 ENCOUNTER — Emergency Department (HOSPITAL_COMMUNITY): Payer: Medicare PPO

## 2015-01-27 ENCOUNTER — Inpatient Hospital Stay (HOSPITAL_COMMUNITY): Payer: Medicare PPO

## 2015-01-27 DIAGNOSIS — N189 Chronic kidney disease, unspecified: Secondary | ICD-10-CM

## 2015-01-27 DIAGNOSIS — Z7982 Long term (current) use of aspirin: Secondary | ICD-10-CM | POA: Diagnosis not present

## 2015-01-27 DIAGNOSIS — A4151 Sepsis due to Escherichia coli [E. coli]: Secondary | ICD-10-CM | POA: Diagnosis present

## 2015-01-27 DIAGNOSIS — N12 Tubulo-interstitial nephritis, not specified as acute or chronic: Secondary | ICD-10-CM | POA: Diagnosis not present

## 2015-01-27 DIAGNOSIS — Z87891 Personal history of nicotine dependence: Secondary | ICD-10-CM | POA: Diagnosis not present

## 2015-01-27 DIAGNOSIS — E785 Hyperlipidemia, unspecified: Secondary | ICD-10-CM | POA: Diagnosis present

## 2015-01-27 DIAGNOSIS — Z96643 Presence of artificial hip joint, bilateral: Secondary | ICD-10-CM | POA: Diagnosis present

## 2015-01-27 DIAGNOSIS — Z90710 Acquired absence of both cervix and uterus: Secondary | ICD-10-CM | POA: Diagnosis not present

## 2015-01-27 DIAGNOSIS — Z833 Family history of diabetes mellitus: Secondary | ICD-10-CM | POA: Diagnosis not present

## 2015-01-27 DIAGNOSIS — F329 Major depressive disorder, single episode, unspecified: Secondary | ICD-10-CM | POA: Diagnosis present

## 2015-01-27 DIAGNOSIS — K5730 Diverticulosis of large intestine without perforation or abscess without bleeding: Secondary | ICD-10-CM | POA: Diagnosis present

## 2015-01-27 DIAGNOSIS — Z885 Allergy status to narcotic agent status: Secondary | ICD-10-CM | POA: Diagnosis not present

## 2015-01-27 DIAGNOSIS — M1990 Unspecified osteoarthritis, unspecified site: Secondary | ICD-10-CM | POA: Diagnosis present

## 2015-01-27 DIAGNOSIS — Z79899 Other long term (current) drug therapy: Secondary | ICD-10-CM | POA: Diagnosis not present

## 2015-01-27 DIAGNOSIS — L501 Idiopathic urticaria: Secondary | ICD-10-CM | POA: Diagnosis present

## 2015-01-27 DIAGNOSIS — Z882 Allergy status to sulfonamides status: Secondary | ICD-10-CM | POA: Diagnosis not present

## 2015-01-27 DIAGNOSIS — N179 Acute kidney failure, unspecified: Secondary | ICD-10-CM | POA: Diagnosis present

## 2015-01-27 DIAGNOSIS — N390 Urinary tract infection, site not specified: Secondary | ICD-10-CM

## 2015-01-27 DIAGNOSIS — Z8249 Family history of ischemic heart disease and other diseases of the circulatory system: Secondary | ICD-10-CM | POA: Diagnosis not present

## 2015-01-27 DIAGNOSIS — I129 Hypertensive chronic kidney disease with stage 1 through stage 4 chronic kidney disease, or unspecified chronic kidney disease: Secondary | ICD-10-CM | POA: Diagnosis present

## 2015-01-27 DIAGNOSIS — N16 Renal tubulo-interstitial disorders in diseases classified elsewhere: Secondary | ICD-10-CM | POA: Diagnosis present

## 2015-01-27 DIAGNOSIS — N151 Renal and perinephric abscess: Secondary | ICD-10-CM | POA: Diagnosis present

## 2015-01-27 DIAGNOSIS — E119 Type 2 diabetes mellitus without complications: Secondary | ICD-10-CM | POA: Diagnosis not present

## 2015-01-27 DIAGNOSIS — Z888 Allergy status to other drugs, medicaments and biological substances status: Secondary | ICD-10-CM | POA: Diagnosis not present

## 2015-01-27 DIAGNOSIS — N183 Chronic kidney disease, stage 3 (moderate): Secondary | ICD-10-CM | POA: Diagnosis present

## 2015-01-27 DIAGNOSIS — E1122 Type 2 diabetes mellitus with diabetic chronic kidney disease: Secondary | ICD-10-CM | POA: Diagnosis present

## 2015-01-27 LAB — CBC
HCT: 29.6 % — ABNORMAL LOW (ref 36.0–46.0)
Hemoglobin: 9.5 g/dL — ABNORMAL LOW (ref 12.0–15.0)
MCH: 27.8 pg (ref 26.0–34.0)
MCHC: 32.1 g/dL (ref 30.0–36.0)
MCV: 86.5 fL (ref 78.0–100.0)
Platelets: 329 10*3/uL (ref 150–400)
RBC: 3.42 MIL/uL — AB (ref 3.87–5.11)
RDW: 14.8 % (ref 11.5–15.5)
WBC: 15.2 10*3/uL — ABNORMAL HIGH (ref 4.0–10.5)

## 2015-01-27 LAB — COMPREHENSIVE METABOLIC PANEL
ALT: 10 U/L — AB (ref 14–54)
AST: 15 U/L (ref 15–41)
Albumin: 2.7 g/dL — ABNORMAL LOW (ref 3.5–5.0)
Alkaline Phosphatase: 37 U/L — ABNORMAL LOW (ref 38–126)
Anion gap: 8 (ref 5–15)
BUN: 28 mg/dL — ABNORMAL HIGH (ref 6–20)
CALCIUM: 8.2 mg/dL — AB (ref 8.9–10.3)
CO2: 25 mmol/L (ref 22–32)
CREATININE: 1.97 mg/dL — AB (ref 0.44–1.00)
Chloride: 106 mmol/L (ref 101–111)
GFR calc Af Amer: 26 mL/min — ABNORMAL LOW (ref >60)
GFR calc non Af Amer: 23 mL/min — ABNORMAL LOW (ref >60)
Glucose, Bld: 147 mg/dL — ABNORMAL HIGH (ref 65–99)
Potassium: 4.6 mmol/L (ref 3.5–5.1)
Sodium: 139 mmol/L (ref 135–145)
Total Bilirubin: 0.5 mg/dL (ref 0.3–1.2)
Total Protein: 5.4 g/dL — ABNORMAL LOW (ref 6.5–8.1)

## 2015-01-27 LAB — URINALYSIS, ROUTINE W REFLEX MICROSCOPIC
Bilirubin Urine: NEGATIVE
Glucose, UA: NEGATIVE mg/dL
Hgb urine dipstick: NEGATIVE
KETONES UR: NEGATIVE mg/dL
Nitrite: POSITIVE — AB
PH: 7 (ref 5.0–8.0)
Protein, ur: NEGATIVE mg/dL
Specific Gravity, Urine: 1.013 (ref 1.005–1.030)
Urobilinogen, UA: 1 mg/dL (ref 0.0–1.0)

## 2015-01-27 LAB — GLUCOSE, CAPILLARY
GLUCOSE-CAPILLARY: 143 mg/dL — AB (ref 65–99)
Glucose-Capillary: 132 mg/dL — ABNORMAL HIGH (ref 65–99)
Glucose-Capillary: 100 mg/dL — ABNORMAL HIGH (ref 65–99)
Glucose-Capillary: 89 mg/dL (ref 65–99)

## 2015-01-27 LAB — URINE MICROSCOPIC-ADD ON

## 2015-01-27 LAB — I-STAT CG4 LACTIC ACID, ED
LACTIC ACID, VENOUS: 0.59 mmol/L (ref 0.5–2.0)
Lactic Acid, Venous: 1.53 mmol/L (ref 0.5–2.0)

## 2015-01-27 MED ORDER — DEXTROSE 5 % IV SOLN
1.0000 g | Freq: Once | INTRAVENOUS | Status: AC
  Administered 2015-01-27: 1 g via INTRAVENOUS
  Filled 2015-01-27: qty 10

## 2015-01-27 MED ORDER — MORPHINE SULFATE 2 MG/ML IJ SOLN
1.0000 mg | INTRAMUSCULAR | Status: DC | PRN
  Administered 2015-01-27: 1 mg via INTRAVENOUS
  Filled 2015-01-27: qty 1

## 2015-01-27 MED ORDER — INSULIN ASPART 100 UNIT/ML ~~LOC~~ SOLN
0.0000 [IU] | Freq: Three times a day (TID) | SUBCUTANEOUS | Status: DC
  Administered 2015-01-29: 2 [IU] via SUBCUTANEOUS

## 2015-01-27 MED ORDER — SODIUM CHLORIDE 0.9 % IV SOLN
INTRAVENOUS | Status: DC
  Administered 2015-01-27 – 2015-01-28 (×4): via INTRAVENOUS

## 2015-01-27 MED ORDER — ACETAMINOPHEN 325 MG PO TABS
650.0000 mg | ORAL_TABLET | Freq: Four times a day (QID) | ORAL | Status: DC | PRN
  Administered 2015-01-27 – 2015-01-28 (×2): 650 mg via ORAL
  Filled 2015-01-27 (×2): qty 2

## 2015-01-27 MED ORDER — HEPARIN SODIUM (PORCINE) 5000 UNIT/ML IJ SOLN
5000.0000 [IU] | Freq: Three times a day (TID) | INTRAMUSCULAR | Status: DC
  Administered 2015-01-27: 5000 [IU] via SUBCUTANEOUS
  Filled 2015-01-27: qty 1

## 2015-01-27 MED ORDER — FENTANYL CITRATE (PF) 100 MCG/2ML IJ SOLN
25.0000 ug | INTRAMUSCULAR | Status: DC | PRN
  Administered 2015-01-27: 50 ug via INTRAVENOUS
  Filled 2015-01-27 (×2): qty 2

## 2015-01-27 MED ORDER — OXYCODONE-ACETAMINOPHEN 5-325 MG PO TABS: 1.0000 | ORAL_TABLET | ORAL | Status: DC | PRN

## 2015-01-27 MED ORDER — CEFTRIAXONE SODIUM IN DEXTROSE 20 MG/ML IV SOLN: 1.0000 g | INTRAVENOUS | Status: DC

## 2015-01-27 MED ORDER — CEFTRIAXONE SODIUM IN DEXTROSE 20 MG/ML IV SOLN
1.0000 g | INTRAVENOUS | Status: DC
  Administered 2015-01-28: 1 g via INTRAVENOUS
  Filled 2015-01-27 (×3): qty 50

## 2015-01-27 MED ORDER — INSULIN ASPART 100 UNIT/ML ~~LOC~~ SOLN: 0.0000 [IU] | Freq: Every day | SUBCUTANEOUS | Status: DC

## 2015-01-27 MED ORDER — SODIUM CHLORIDE 0.9 % IV BOLUS (SEPSIS)
1000.0000 mL | Freq: Once | INTRAVENOUS | Status: AC
  Administered 2015-01-27: 1000 mL via INTRAVENOUS

## 2015-01-27 MED ORDER — ACETAMINOPHEN 650 MG RE SUPP: 650.0000 mg | Freq: Four times a day (QID) | RECTAL | Status: DC | PRN

## 2015-01-27 MED ORDER — ONDANSETRON HCL 4 MG PO TABS: 4.0000 mg | ORAL_TABLET | Freq: Four times a day (QID) | ORAL | Status: DC | PRN

## 2015-01-27 MED ORDER — ONDANSETRON HCL 4 MG/2ML IJ SOLN
4.0000 mg | Freq: Four times a day (QID) | INTRAMUSCULAR | Status: DC | PRN
  Administered 2015-01-27: 4 mg via INTRAVENOUS
  Filled 2015-01-27: qty 2

## 2015-01-27 MED ORDER — DIPHENHYDRAMINE HCL 25 MG PO CAPS
25.0000 mg | ORAL_CAPSULE | Freq: Four times a day (QID) | ORAL | Status: DC | PRN
  Administered 2015-01-27 – 2015-01-28 (×2): 25 mg via ORAL
  Filled 2015-01-27 (×2): qty 1

## 2015-01-27 NOTE — Progress Notes (Addendum)
81yo female c/o weakness, N/V, fever, UA abnormal, to begin IV ABX.  Will start Rocephin 1g IV Q24H and monitor CBC and Cx.  Wynona Neat, PharmD, BCPS 01/27/2015 3:54 AM    ===============   Addendum: - pharmacy will sign off as dosage adjustment is unnecessary.  Thank you for the consult!   Abdelaziz Westenberger D. Mina Marble, PharmD, BCPS Pager:  [phone] 01/27/2015, 7:08 AM

## 2015-01-27 NOTE — Progress Notes (Signed)
Please note that patient has been admitted after midnight, see earlier admission note by Dr. Posey Pronto. This is the addendum to the admission note.  Patient is 81 year old female with diabetes type 2 on metformin, chronic kidney disease stage II - III, HTN, presented with sudden onset of nausea and nonbloody vomiting, subjective fevers and chills, right flank pain that has been constant and 10/10 in severity, radiating to lower right abdominal quadrant, no specific alleviating factors, worse with eating and with movement.  CT abdomen and pelvis without contrast notable for right renal collection 2 cm in size, moderate adjacent perinephric stranding, question  subcapsular hematoma versus ruptured cyst. Recommendation was to proceed with contrast enhanced exam or MRI however, acute on chronic renal failure precludes use of contrast material at this time.  Please note patient meets criteria for sepsis with T101.95F, HR 114, WBC 18.5 K, source likely urinary as suggested by urinalysis. Patient started on Rocephin. Continue same medical regimen for now, also continue IV fluids, repeat CBC and BMP in the morning.  Faye Ramsay, MD  [facility] Pager [phone]  If 7PM-7AM, please contact night-coverage [email or web address] Password [email or web address]

## 2015-01-27 NOTE — ED Notes (Addendum)
phlebotomy at bedside.  

## 2015-01-27 NOTE — H&P (Signed)
[facility] History and Physical  Patient: Jill [name]  MRN: [id number]  DOB: 1932/01/04  DOS: the patient was seen and examined on 01/27/2015 PCP: Eliezer Lofts, MD  Referring physician: Dr. Dina Rich Chief Complaint: Nausea vomiting chills and fever  HPI: Jill [name] is a 81 y.o. female with Past medical history of diabetes mellitus, chronic kidney disease, hypertension. The patient is presenting with complaints of nausea vomiting started tonight associated with fever and chills. She also has some cough which was nonproductive. The patient denies any complaints of back pain burning urination but she feels she has been urinating more than her usual. She denies any diarrhea or constipation denies any chest pain, or abdominal pain or flank pain. She denies any similar symptoms in the past. Recently she was placed on Lexapro instead of citalopram.  The patient is coming from home. And at her baseline independent for most of her ADL.  Review of Systems: as mentioned in the history of present illness.  A comprehensive review of the other systems is negative.  Past Medical History  Diagnosis Date  . Diabetes mellitus   . Hyperlipidemia   . Chronic kidney disease   . Depression   . Allergy   . Hypertension   . Vasovagal syncope 2006    Negative cardiac workup-myoview, echo  . Diverticula, colon   . Family history of anesthesia complication     Son is difficult intubation  . Complication of anesthesia     difficult waking   . Arthritis     osteoarthritis    Past Surgical History  Procedure Laterality Date  . Abdominal hysterectomy  1977    fibroid  . Joint replacement  2009    rt hip  . Total hip arthroplasty Left 05/12/2014    dr Mayer Camel  . Total hip arthroplasty Left 05/12/2014    Procedure: TOTAL HIP ARTHROPLASTY;  Surgeon: Kerin Salen, MD;  Location: [place];  Service: Orthopedics;  Laterality: Left;   Social History:  reports that she quit smoking about 15 years  ago. Her smoking use included Cigarettes. She has a 45 pack-year smoking history. She has never used smokeless tobacco. She reports that she does not drink alcohol or use illicit drugs.  Allergies  Allergen Reactions  . Lovastatin     REACTION: leg pain  . Niacin   . Statins     REACTION: leg cramps, weakness  . Sulfa Antibiotics Hives and Itching  . Codeine Rash    Family History  Problem Relation Age of Onset  . COPD Brother   . Heart disease Brother   . Diabetes Brother   . Lymphoma Brother   . Alzheimer's disease Brother   . Heart disease Mother   . Pancreatic cancer Sister   . Alzheimer's disease Sister   . Breast cancer Sister   . Leukemia Other     Prior to Admission medications   Medication Sig Start Date End Date Taking? Authorizing Provider  ALPRAZolam (XANAX) 0.25 MG tablet Take 1 tablet (0.25 mg total) by mouth 2 (two) times daily as needed for anxiety. 01/22/15  Yes Amy Cletis Athens, MD  aspirin EC 325 MG tablet Take 1 tablet (325 mg total) by mouth 2 (two) times daily. Patient taking differently: Take 325 mg by mouth daily.  05/12/14  Yes Leighton Parody, PA-C  escitalopram (LEXAPRO) 20 MG tablet Take 1 tablet (20 mg total) by mouth daily. 01/22/15  Yes Amy Cletis Athens, MD  fenofibrate 54 MG  tablet TAKE TWO (2) TABLETS BY MOUTH DAILY 12/09/14  Yes Amy Cletis Athens, MD  metFORMIN (GLUCOPHAGE) 500 MG tablet Take 500 mg by mouth 2 (two) times daily with a meal.    Yes Historical Provider, MD    Physical Exam: Filed Vitals:   01/27/15 0245 01/27/15 0300 01/27/15 0330 01/27/15 0550  BP: 126/50 128/56 138/48 122/90  Pulse: 80 79 77 69  Temp:   98.2 F (36.8 C) 98.1 F (36.7 C)  TempSrc:   Oral Oral  Resp: 20 19 18 18   Height:   5\' 2"  (1.575 m)   Weight:   78.744 kg (173 lb 9.6 oz)   SpO2: 96% 95% 95% 96%    General: Alert, Awake and Oriented to Time, Place and Person. Appear in mild distress Eyes: PERRL ENT: Oral Mucosa clear moist. Neck: no JVD Cardiovascular: S1  and S2 Present, no Murmur, Peripheral Pulses Present Respiratory: Bilateral Air entry equal and Decreased,  Clear to Auscultation, no Crackles, no wheezes Abdomen: Bowel Sound present, Soft and non tender Skin: no Rash Extremities: no Pedal edema, no calf tenderness Neurologic: Grossly no focal neuro deficit.  Labs on Admission:  CBC:  Recent Labs Lab 01/26/15 2219 01/26/15 2232  WBC 18.5*  --   NEUTROABS 14.1*  --   HGB 10.8* 11.9*  HCT 34.2* 35.0*  MCV 87.0  --   PLT 400  --     CMP     Component Value Date/Time   NA 137 01/26/2015 2232   K 4.1 01/26/2015 2232   CL 104 01/26/2015 2232   CO2 25 05/23/2014 1044   GLUCOSE 208* 01/26/2015 2232   BUN 33* 01/26/2015 2232   CREATININE 1.90* 01/26/2015 2232   CREATININE 1.62* 01/30/2013 0928   CALCIUM 9.4 05/23/2014 1044   CALCIUM 9.1 02/12/2008 2351   PROT 5.6* 05/18/2014 0600   ALBUMIN 2.3* 05/18/2014 0600   AST 28 05/18/2014 0600   ALT 12 05/18/2014 0600   ALKPHOS 47 05/18/2014 0600   BILITOT 0.3 05/18/2014 0600   GFRNONAA 38* 05/18/2014 0600   GFRAA 45* 05/18/2014 0600    No results for input(s): LIPASE, AMYLASE in the last 168 hours.  No results for input(s): CKTOTAL, CKMB, CKMBINDEX, TROPONINI in the last 168 hours. BNP (last 3 results) No results for input(s): BNP in the last 8760 hours.  ProBNP (last 3 results) No results for input(s): PROBNP in the last 8760 hours.   Radiological Exams on Admission: Dg Abd Acute W/chest  01/27/2015   CLINICAL DATA:  Emesis today.  Fever of 104.  EXAM: DG ABDOMEN ACUTE W/ 1V CHEST  COMPARISON:  None.  FINDINGS: There is no evidence of dilated bowel loops or free intraperitoneal air. Mildly prominent mid abdominal small bowel loops are nondistended. No radiopaque calculi or other significant radiographic abnormality is seen. Heart size and mediastinal contours are within normal limits. Both lungs are clear. Calcified tortuous aorta. Moderate stool burden. BILATERAL hip  replacements. Lumbar spine degenerative disc disease.  IMPRESSION: Mildly prominent mid abdominal small bowel loops without obstruction. No visible free air.  No active cardiopulmonary disease.   Electronically Signed   By: Rolla Flatten M.D.   On: 01/27/2015 01:15   Assessment/Plan Principal Problem:   UTI (lower urinary tract infection) Active Problems:   Diabetes mellitus without complication   Chronic idiopathic urticaria   Acute-on-chronic kidney injury   1. UTI (lower urinary tract infection) The patient is presenting with complaints of nausea vomiting fevers or chills.  Her x-ray abdomen and chest does not show any acute abnormality. Urine shows evidence of pyuria. She has significant leukocytosis but this remained nauseated therefore she will be admitted in the hospital. Continue with IV ceftriaxone IV fluids. At present she does not have any CVA tenderness. Monitor closely.  2. diabetes mellitus. Holding metformin and placing her on sliding scale.  3. acute on chronic kidney disease. Serum creatinine mildly elevated. Continue IV hydration and recheck BMP.  4. recurrent urticaria. Patient mentions her symptoms are progressively worsening in the last 1 week. At present does not appear to be having significant skin lesions. Continue close monitoring.  Advance goals of care discussion: DNR/DNI as per my discussion with patient   DVT Prophylaxis: subcutaneous Heparin Nutrition: Regular diet  Family Communication: family was present at bedside, opportunity was given to ask question and all questions were answered satisfactorily at the time of interview. Disposition: Admitted as inpatient, telemetry unit.  Author: Berle Mull, MD [facility] Pager: [phone] 01/27/2015  If 7PM-7AM, please contact night-coverage [email or web address] Password [email or web address]

## 2015-01-28 DIAGNOSIS — N151 Renal and perinephric abscess: Secondary | ICD-10-CM

## 2015-01-28 DIAGNOSIS — N179 Acute kidney failure, unspecified: Secondary | ICD-10-CM

## 2015-01-28 DIAGNOSIS — N189 Chronic kidney disease, unspecified: Secondary | ICD-10-CM

## 2015-01-28 DIAGNOSIS — N12 Tubulo-interstitial nephritis, not specified as acute or chronic: Secondary | ICD-10-CM

## 2015-01-28 DIAGNOSIS — N289 Disorder of kidney and ureter, unspecified: Secondary | ICD-10-CM

## 2015-01-28 DIAGNOSIS — E119 Type 2 diabetes mellitus without complications: Secondary | ICD-10-CM

## 2015-01-28 LAB — CBC
HCT: 27.4 % — ABNORMAL LOW (ref 36.0–46.0)
HEMOGLOBIN: 8.6 g/dL — AB (ref 12.0–15.0)
MCH: 27.4 pg (ref 26.0–34.0)
MCHC: 31.4 g/dL (ref 30.0–36.0)
MCV: 87.3 fL (ref 78.0–100.0)
PLATELETS: 312 10*3/uL (ref 150–400)
RBC: 3.14 MIL/uL — ABNORMAL LOW (ref 3.87–5.11)
RDW: 15 % (ref 11.5–15.5)
WBC: 18.4 10*3/uL — ABNORMAL HIGH (ref 4.0–10.5)

## 2015-01-28 LAB — BASIC METABOLIC PANEL
Anion gap: 11 (ref 5–15)
BUN: 28 mg/dL — ABNORMAL HIGH (ref 6–20)
CALCIUM: 7.5 mg/dL — AB (ref 8.9–10.3)
CO2: 19 mmol/L — ABNORMAL LOW (ref 22–32)
Chloride: 104 mmol/L (ref 101–111)
Creatinine, Ser: 2.75 mg/dL — ABNORMAL HIGH (ref 0.44–1.00)
GFR calc non Af Amer: 15 mL/min — ABNORMAL LOW (ref >60)
GFR, EST AFRICAN AMERICAN: 18 mL/min — AB (ref >60)
Glucose, Bld: 101 mg/dL — ABNORMAL HIGH (ref 65–99)
Potassium: 4.2 mmol/L (ref 3.5–5.1)
SODIUM: 134 mmol/L — AB (ref 135–145)

## 2015-01-28 LAB — GLUCOSE, CAPILLARY
GLUCOSE-CAPILLARY: 70 mg/dL (ref 65–99)
Glucose-Capillary: 105 mg/dL — ABNORMAL HIGH (ref 65–99)
Glucose-Capillary: 62 mg/dL — ABNORMAL LOW (ref 65–99)
Glucose-Capillary: 72 mg/dL (ref 65–99)
Glucose-Capillary: 101 mg/dL — ABNORMAL HIGH (ref 65–99)

## 2015-01-28 MED ORDER — CEFTAZIDIME 1 G IJ SOLR
1.0000 g | Freq: Three times a day (TID) | INTRAMUSCULAR | Status: DC
  Administered 2015-01-28 – 2015-01-30 (×6): 1 g via INTRAVENOUS
  Filled 2015-01-28 (×8): qty 1

## 2015-01-28 MED ORDER — HYDROXYZINE HCL 10 MG/5ML PO SYRP
10.0000 mg | ORAL_SOLUTION | Freq: Three times a day (TID) | ORAL | Status: DC | PRN
  Administered 2015-01-28: 10 mg via ORAL
  Filled 2015-01-28 (×2): qty 5

## 2015-01-28 NOTE — Progress Notes (Addendum)
[facility] PROGRESS NOTE  Assessment/Plan: Pyelonephritis/nre renal abscess: - started on empiric rocephin on 6.7.2016. - U.C. pending, has defervesce, worsening leukocytosis. - changed ABx to fortaz.  Acute-on-chronic kidney injury - [place] due to Infectious etiology. - cont IV fluid recheck b-met in am.    Diabetes mellitus without complication - d/c metformin start SSI. - resume diet.    Code Status: full Family Communication: none  Disposition Plan: inpatient > 4 days   Consultants:  none  Procedures:  CT abd  Antibiotics:  Fortaz  HPI/Subjective: Relates her pain is better.  Objective: Filed Vitals:   01/27/15 1342 01/27/15 2130 01/28/15 0616 01/28/15 1255  BP: 142/58 121/91 137/54 133/54  Pulse: 82 91 90 88  Temp: 98.2 F (36.8 C) 98.5 F (36.9 C) 99.2 F (37.3 C) 98.9 F (37.2 C)  TempSrc: Oral Oral Oral Oral  Resp: 18 18 18 18   Height:      Weight:   80.6 kg (177 lb 11.1 oz)   SpO2: 97% 94% 94% 94%    Intake/Output Summary (Last 24 hours) at 01/28/15 1525 Last data filed at 01/28/15 1458  Gross per 24 hour  Intake     60 ml  Output    300 ml  Net   -240 ml   Filed Weights   01/27/15 0330 01/28/15 0616  Weight: 78.744 kg (173 lb 9.6 oz) 80.6 kg (177 lb 11.1 oz)    Exam:  General: Alert, awake, oriented x3, in no acute distress.  HEENT: No bruits, no goiter.  Heart: Regular rate and rhythm, without murmurs, rubs, gallops.  Lungs: Good air movement, bilateral air movement.  Abdomen: Soft, flank tenderness. Neuro: Grossly intact, nonfocal.   Data Reviewed: Basic Metabolic Panel:  Recent Labs Lab 01/26/15 2232 01/27/15 0850 01/28/15 0540  NA 137 139 134*  K 4.1 4.6 4.2  CL 104 106 104  CO2  --  25 19*  GLUCOSE 208* 147* 101*  BUN 33* 28* 28*  CREATININE 1.90* 1.97* 2.75*  CALCIUM  --  8.2* 7.5*   Liver Function Tests:  Recent Labs Lab 01/27/15 0850  AST 15  ALT 10*  ALKPHOS 37*  BILITOT 0.5  PROT  5.4*  ALBUMIN 2.7*   No results for input(s): LIPASE, AMYLASE in the last 168 hours. No results for input(s): AMMONIA in the last 168 hours. CBC:  Recent Labs Lab 01/26/15 2219 01/26/15 2232 01/27/15 0850 01/28/15 0540  WBC 18.5*  --  15.2* 18.4*  NEUTROABS 14.1*  --   --   --   HGB 10.8* 11.9* 9.5* 8.6*  HCT 34.2* 35.0* 29.6* 27.4*  MCV 87.0  --  86.5 87.3  PLT 400  --  329 312   Cardiac Enzymes: No results for input(s): CKTOTAL, CKMB, CKMBINDEX, TROPONINI in the last 168 hours. BNP (last 3 results) No results for input(s): BNP in the last 8760 hours.  ProBNP (last 3 results) No results for input(s): PROBNP in the last 8760 hours.  CBG:  Recent Labs Lab 01/27/15 1202 01/27/15 1717 01/27/15 2126 01/28/15 0741 01/28/15 1152  GLUCAP 132* 100* 89 105* 70    Recent Results (from the past 240 hour(s))  Blood culture (routine x 2)     Status: None (Preliminary result)   Collection Time: 01/27/15 12:08 AM  Result Value Ref Range Status   Specimen Description BLOOD RIGHT ANTECUBITAL  Final   Special Requests BOTTLES DRAWN AEROBIC AND ANAEROBIC 10CC EA  Final   Culture  Final           BLOOD CULTURE RECEIVED NO GROWTH TO DATE CULTURE WILL BE HELD FOR 5 DAYS BEFORE ISSUING A FINAL NEGATIVE REPORT Performed at [facility]    Report Status PENDING  Incomplete  Blood culture (routine x 2)     Status: None (Preliminary result)   Collection Time: 01/27/15 12:18 AM  Result Value Ref Range Status   Specimen Description BLOOD LEFT ANTECUBITAL  Final   Special Requests BOTTLES DRAWN AEROBIC AND ANAEROBIC 5CC EA  Final   Culture   Final           BLOOD CULTURE RECEIVED NO GROWTH TO DATE CULTURE WILL BE HELD FOR 5 DAYS BEFORE ISSUING A FINAL NEGATIVE REPORT Performed at [facility]    Report Status PENDING  Incomplete  Urine culture     Status: None (Preliminary result)   Collection Time: 01/27/15 12:45 AM  Result Value Ref Range Status   Specimen  Description URINE, CLEAN CATCH  Final   Special Requests NONE  Final   Colony Count   Final    >=100,000 COLONIES/ML Performed at [facility]    Culture   Final    ESCHERICHIA COLI Performed at [facility]    Report Status PENDING  Incomplete     Studies: Dg Abd Acute W/chest  01/27/2015   CLINICAL DATA:  Emesis today.  Fever of 104.  EXAM: DG ABDOMEN ACUTE W/ 1V CHEST  COMPARISON:  None.  FINDINGS: There is no evidence of dilated bowel loops or free intraperitoneal air. Mildly prominent mid abdominal small bowel loops are nondistended. No radiopaque calculi or other significant radiographic abnormality is seen. Heart size and mediastinal contours are within normal limits. Both lungs are clear. Calcified tortuous aorta. Moderate stool burden. BILATERAL hip replacements. Lumbar spine degenerative disc disease.  IMPRESSION: Mildly prominent mid abdominal small bowel loops without obstruction. No visible free air.  No active cardiopulmonary disease.   Electronically Signed   By: Rolla Flatten M.D.   On: 01/27/2015 01:15   Ct Renal Stone Study  01/27/2015   CLINICAL DATA:  Sudden onset of right flank pain.  EXAM: CT ABDOMEN AND PELVIS WITHOUT CONTRAST  TECHNIQUE: Multidetector CT imaging of the abdomen and pelvis was performed following the standard protocol without IV contrast.  COMPARISON:  None.  FINDINGS: There is a 3 mm subpleural nodule in the right lower lobe. Minimal subpleural fibrosis in the right middle lobe and lingula.  There is moderate perinephric stranding about the right kidney. Subcapsular renal collection measuring 2.3 cm causing compression of the renal parenchyma. The right renal collecting system is poorly defined. There is a 1.3 cm hypodensity in the lower right kidney. No definite stone. The right ureter were visible is decompressed without stone. There is thinning of the left renal parenchyma without hydronephrosis or stone evaluation of the distal ureters is  obscured by streak artifact from hip prostheses.  Evaluation of the remaining solid and hollow viscera is limited given lack of contrast. The unenhanced liver, gallbladder, spleen, and left adrenal gland are normal. Pancreas is atrophic without ductal dilatation. Right adrenal gland is obscured.  Small hiatal hernia. Stomach is decompressed. There are no dilated or thickened bowel loops. Diverticulosis of the distal colon without diverticulitis. The appendix is normal. No colonic wall thickening.  No retroperitoneal adenopathy. Abdominal aorta is normal in caliber. Moderate to dense atherosclerosis of the abdominal aorta without aneurysm.  Pelvic not any is  obscured by streak artifact and hip prosthesis. The uterus appears surgically absent.  Diffuse degenerative change throughout the spine with degenerative disc disease and facet arthropathy.  IMPRESSION: 1. Heterogeneous of capsule right renal collection measuring up to 2.3 cm causing compression of the renal parenchyma and distorting normal corticomedullary anatomy. There is moderate adjacent perinephric stranding. Findings may reflect a subcapsular hematoma, however additional subcapsular collections such as ruptured cyst or infected fluid collection are not excluded. Contrast-enhanced exam or MRI recommended if patient is able tolerate. There is no urolithiasis. 2. Incidental findings of the small hiatal hernia. Diverticulosis without diverticulitis.  These results were called by telephone at the time of interpretation on 01/27/2015 at 6:51 am to Dr. Berle Mull , who verbally acknowledged these results.   Electronically Signed   By: Jeb Levering M.D.   On: 01/27/2015 06:53    Scheduled Meds: . cefTRIAXone (ROCEPHIN)  IV  1 g Intravenous Q24H  . insulin aspart  0-9 Units Subcutaneous TID WC   Continuous Infusions: . sodium chloride 75 mL/hr at 01/28/15 0826    Time Spent: 25 min   Charlynne Cousins  [facility] Pager [phone]. If  7PM-7AM, please contact night-coverage at [email or web address], password [facility] 01/28/2015, 3:25 PM  LOS: 1 day

## 2015-01-29 LAB — BASIC METABOLIC PANEL
Anion gap: 11 (ref 5–15)
BUN: 29 mg/dL — AB (ref 6–20)
CALCIUM: 7.6 mg/dL — AB (ref 8.9–10.3)
CO2: 19 mmol/L — AB (ref 22–32)
Chloride: 105 mmol/L (ref 101–111)
Creatinine, Ser: 2.82 mg/dL — ABNORMAL HIGH (ref 0.44–1.00)
GFR calc Af Amer: 17 mL/min — ABNORMAL LOW (ref >60)
GFR calc non Af Amer: 15 mL/min — ABNORMAL LOW (ref >60)
GLUCOSE: 90 mg/dL (ref 65–99)
Potassium: 4.6 mmol/L (ref 3.5–5.1)
Sodium: 135 mmol/L (ref 135–145)

## 2015-01-29 LAB — GLUCOSE, CAPILLARY
GLUCOSE-CAPILLARY: 170 mg/dL — AB (ref 65–99)
Glucose-Capillary: 84 mg/dL (ref 65–99)
Glucose-Capillary: 132 mg/dL — ABNORMAL HIGH (ref 65–99)
Glucose-Capillary: 84 mg/dL (ref 65–99)

## 2015-01-29 LAB — CBC
HCT: 27.8 % — ABNORMAL LOW (ref 36.0–46.0)
Hemoglobin: 8.8 g/dL — ABNORMAL LOW (ref 12.0–15.0)
MCH: 27.2 pg (ref 26.0–34.0)
MCHC: 31.7 g/dL (ref 30.0–36.0)
MCV: 85.8 fL (ref 78.0–100.0)
Platelets: 288 10*3/uL (ref 150–400)
RBC: 3.24 MIL/uL — AB (ref 3.87–5.11)
RDW: 14.8 % (ref 11.5–15.5)
WBC: 17.6 10*3/uL — ABNORMAL HIGH (ref 4.0–10.5)

## 2015-01-29 LAB — URINE CULTURE: Colony Count: >=100000

## 2015-01-29 LAB — HEMOGLOBIN A1C
Hgb A1c MFr Bld: 7.3 % — ABNORMAL HIGH (ref 4.8–5.6)
Mean Plasma Glucose: 163 mg/dL

## 2015-01-29 MED ORDER — SODIUM BICARBONATE 8.4 % IV SOLN
INTRAVENOUS | Status: DC
Start: 2015-01-29 — End: 2015-01-30
  Administered 2015-01-29 – 2015-01-30 (×2): via INTRAVENOUS
  Filled 2015-01-29 (×4): qty 1000

## 2015-01-29 MED ORDER — INSULIN ASPART 100 UNIT/ML ~~LOC~~ SOLN
3.0000 [IU] | Freq: Three times a day (TID) | SUBCUTANEOUS | Status: DC
  Administered 2015-01-29 – 2015-02-01 (×6): 3 [IU] via SUBCUTANEOUS

## 2015-01-29 MED ORDER — INSULIN ASPART 100 UNIT/ML ~~LOC~~ SOLN: 0.0000 [IU] | Freq: Every day | SUBCUTANEOUS | Status: DC

## 2015-01-29 MED ORDER — INSULIN ASPART 100 UNIT/ML ~~LOC~~ SOLN
0.0000 [IU] | Freq: Three times a day (TID) | SUBCUTANEOUS | Status: DC
  Administered 2015-01-29 – 2015-01-30 (×2): 1 [IU] via SUBCUTANEOUS

## 2015-01-29 NOTE — Progress Notes (Signed)
[facility] PROGRESS NOTE  Assessment/Plan: Pyelonephritis/nre renal abscess: - started on empiric rocephin on 6.7.2016 transition to [place] on 6.8.2016. - U.C. Grew e.coli pan sensitive. has defervesce, leukocytosis is improving. - cont IV fortaz.  Acute-on-chronic kidney injury - has stabilize at 2.7 due to Infectious etiology. - Change IV D5w with HCO.    Diabetes mellitus without complication - d/c metformin start SSI. - resume diet.    Code Status: full Family Communication: none  Disposition Plan: inpatient > 4 days   Consultants:  none  Procedures:  CT abd  Antibiotics:  Fortaz  HPI/Subjective: Relates her pain is resolved.  Objective: Filed Vitals:   01/28/15 0616 01/28/15 1255 01/28/15 2132 01/29/15 0556  BP: 137/54 133/54 137/53 149/56  Pulse: 90 88 85 89  Temp: 99.2 F (37.3 C) 98.9 F (37.2 C) 98.5 F (36.9 C) 98.7 F (37.1 C)  TempSrc: Oral Oral  Oral  Resp: 18 18 18 18   Height:      Weight: 80.6 kg (177 lb 11.1 oz)   81.4 kg (179 lb 7.3 oz)  SpO2: 94% 94% 96% 95%    Intake/Output Summary (Last 24 hours) at 01/29/15 1212 Last data filed at 01/29/15 0906  Gross per 24 hour  Intake   2995 ml  Output      0 ml  Net   2995 ml   Filed Weights   01/27/15 0330 01/28/15 0616 01/29/15 0556  Weight: 78.744 kg (173 lb 9.6 oz) 80.6 kg (177 lb 11.1 oz) 81.4 kg (179 lb 7.3 oz)    Exam:  General: Alert, awake, oriented x3, in no acute distress.  HEENT: No bruits, no goiter.  Heart: Regular rate and rhythm, without murmurs, rubs, gallops.  Lungs: Good air movement, bilateral air movement.  Abdomen: Soft, flank tenderness. Neuro: Grossly intact, nonfocal.   Data Reviewed: Basic Metabolic Panel:  Recent Labs Lab 01/26/15 2232 01/27/15 0850 01/28/15 0540 01/29/15 0350  NA 137 139 134* 135  K 4.1 4.6 4.2 4.6  CL 104 106 104 105  CO2  --  25 19* 19*  GLUCOSE 208* 147* 101* 90  BUN 33* 28* 28* 29*  CREATININE 1.90* 1.97*  2.75* 2.82*  CALCIUM  --  8.2* 7.5* 7.6*   Liver Function Tests:  Recent Labs Lab 01/27/15 0850  AST 15  ALT 10*  ALKPHOS 37*  BILITOT 0.5  PROT 5.4*  ALBUMIN 2.7*   No results for input(s): LIPASE, AMYLASE in the last 168 hours. No results for input(s): AMMONIA in the last 168 hours. CBC:  Recent Labs Lab 01/26/15 2219 01/26/15 2232 01/27/15 0850 01/28/15 0540 01/29/15 0350  WBC 18.5*  --  15.2* 18.4* 17.6*  NEUTROABS 14.1*  --   --   --   --   HGB 10.8* 11.9* 9.5* 8.6* 8.8*  HCT 34.2* 35.0* 29.6* 27.4* 27.8*  MCV 87.0  --  86.5 87.3 85.8  PLT 400  --  329 312 288   Cardiac Enzymes: No results for input(s): CKTOTAL, CKMB, CKMBINDEX, TROPONINI in the last 168 hours. BNP (last 3 results) No results for input(s): BNP in the last 8760 hours.  ProBNP (last 3 results) No results for input(s): PROBNP in the last 8760 hours.  CBG:  Recent Labs Lab 01/28/15 1733 01/28/15 1734 01/28/15 2127 01/29/15 0709 01/29/15 1140  GLUCAP 62* 72 101* 84 170*    Recent Results (from the past 240 hour(s))  Blood culture (routine x 2)     Status: None (  Preliminary result)   Collection Time: 01/27/15 12:08 AM  Result Value Ref Range Status   Specimen Description BLOOD RIGHT ANTECUBITAL  Final   Special Requests BOTTLES DRAWN AEROBIC AND ANAEROBIC 10CC EA  Final   Culture   Final           BLOOD CULTURE RECEIVED NO GROWTH TO DATE CULTURE WILL BE HELD FOR 5 DAYS BEFORE ISSUING A FINAL NEGATIVE REPORT Performed at [facility]    Report Status PENDING  Incomplete  Blood culture (routine x 2)     Status: None (Preliminary result)   Collection Time: 01/27/15 12:18 AM  Result Value Ref Range Status   Specimen Description BLOOD LEFT ANTECUBITAL  Final   Special Requests BOTTLES DRAWN AEROBIC AND ANAEROBIC 5CC EA  Final   Culture   Final           BLOOD CULTURE RECEIVED NO GROWTH TO DATE CULTURE WILL BE HELD FOR 5 DAYS BEFORE ISSUING A FINAL NEGATIVE REPORT Performed at  [facility]    Report Status PENDING  Incomplete  Urine culture     Status: None   Collection Time: 01/27/15 12:45 AM  Result Value Ref Range Status   Specimen Description URINE, CLEAN CATCH  Final   Special Requests NONE  Final   Colony Count   Final    >=100,000 COLONIES/ML Performed at [facility]    Culture   Final    ESCHERICHIA COLI Performed at [facility]    Report Status 01/29/2015 FINAL  Final   Organism ID, Bacteria ESCHERICHIA COLI  Final      Susceptibility   Escherichia coli - MIC*    AMPICILLIN 8 SENSITIVE Sensitive     CEFAZOLIN <=4 SENSITIVE Sensitive     CEFTRIAXONE <=1 SENSITIVE Sensitive     CIPROFLOXACIN <=0.25 SENSITIVE Sensitive     GENTAMICIN <=1 SENSITIVE Sensitive     LEVOFLOXACIN <=0.12 SENSITIVE Sensitive     NITROFURANTOIN <=16 SENSITIVE Sensitive     TOBRAMYCIN <=1 SENSITIVE Sensitive     TRIMETH/SULFA <=20 SENSITIVE Sensitive     PIP/TAZO <=4 SENSITIVE Sensitive     * ESCHERICHIA COLI     Studies: No results found.  Scheduled Meds: . cefTAZidime (FORTAZ)  IV  1 g Intravenous Q8H  . insulin aspart  0-9 Units Subcutaneous TID WC   Continuous Infusions: . dextrose 5 % 1,000 mL with sodium bicarbonate 150 mEq infusion      Time Spent: 25 min   Charlynne Cousins  [facility] Pager [phone]. If 7PM-7AM, please contact night-coverage at [email or web address], password [facility] 01/29/2015, 12:12 PM  LOS: 2 days

## 2015-01-30 LAB — CBC
HEMATOCRIT: 25.3 % — AB (ref 36.0–46.0)
HEMOGLOBIN: 8.2 g/dL — AB (ref 12.0–15.0)
MCH: 27.2 pg (ref 26.0–34.0)
MCHC: 32.4 g/dL (ref 30.0–36.0)
MCV: 83.8 fL (ref 78.0–100.0)
PLATELETS: 283 10*3/uL (ref 150–400)
RBC: 3.02 MIL/uL — AB (ref 3.87–5.11)
RDW: 14.6 % (ref 11.5–15.5)
WBC: 12.8 10*3/uL — AB (ref 4.0–10.5)

## 2015-01-30 LAB — GLUCOSE, CAPILLARY
GLUCOSE-CAPILLARY: 126 mg/dL — AB (ref 65–99)
GLUCOSE-CAPILLARY: 74 mg/dL (ref 65–99)
Glucose-Capillary: 104 mg/dL — ABNORMAL HIGH (ref 65–99)
Glucose-Capillary: 69 mg/dL (ref 65–99)
Glucose-Capillary: 125 mg/dL — ABNORMAL HIGH (ref 65–99)

## 2015-01-30 LAB — BASIC METABOLIC PANEL
Anion gap: 7 (ref 5–15)
BUN: 23 mg/dL — AB (ref 6–20)
CO2: 25 mmol/L (ref 22–32)
CREATININE: 2.58 mg/dL — AB (ref 0.44–1.00)
Calcium: 8 mg/dL — ABNORMAL LOW (ref 8.9–10.3)
Chloride: 107 mmol/L (ref 101–111)
GFR, EST AFRICAN AMERICAN: 19 mL/min — AB (ref >60)
GFR, EST NON AFRICAN AMERICAN: 16 mL/min — AB (ref >60)
Glucose, Bld: 119 mg/dL — ABNORMAL HIGH (ref 65–99)
POTASSIUM: 4.3 mmol/L (ref 3.5–5.1)
SODIUM: 139 mmol/L (ref 135–145)

## 2015-01-30 MED ORDER — DEXTROSE 5 % IV SOLN
1.0000 g | INTRAVENOUS | Status: DC
  Administered 2015-01-31: 1 g via INTRAVENOUS
  Filled 2015-01-30: qty 1

## 2015-01-30 MED ORDER — SODIUM CHLORIDE 0.9 % IV SOLN
INTRAVENOUS | Status: AC
  Administered 2015-01-30: 75 mL/h via INTRAVENOUS

## 2015-01-30 NOTE — Progress Notes (Signed)
[facility] PROGRESS NOTE  Assessment/Plan: Pyelonephritis/nre renal abscess: - started on empiric rocephin on 6.7.2016 transition to [place] on 6.8.2016. - U.C. Grew e.coli pan sensitive. has defervesce, leukocytosis is improving. - cont IV fortaz.  Acute-on-chronic kidney injury - Cr has improved. - Change IV D5w with HCO.    Diabetes mellitus without complication - d/c metformin start SSI. - resume diet.    Code Status: full Family Communication: none  Disposition Plan: inpatient > 4 days   Consultants:  none  Procedures:  CT abd  Antibiotics:  Fortaz  HPI/Subjective: No complains  Objective: Filed Vitals:   01/29/15 0556 01/29/15 1323 01/29/15 2048 01/30/15 0636  BP: 149/56 161/64 171/68 160/64  Pulse: 89 90 91 85  Temp: 98.7 F (37.1 C) 98.3 F (36.8 C) 98.6 F (37 C) 98.4 F (36.9 C)  TempSrc: Oral Oral Oral Oral  Resp: 18 18 18 18   Height:      Weight: 81.4 kg (179 lb 7.3 oz)   81.2 kg (179 lb 0.2 oz)  SpO2: 95% 98% 96% 97%    Intake/Output Summary (Last 24 hours) at 01/30/15 1300 Last data filed at 01/30/15 0900  Gross per 24 hour  Intake 2214.83 ml  Output      0 ml  Net 2214.83 ml   Filed Weights   01/28/15 0616 01/29/15 0556 01/30/15 0636  Weight: 80.6 kg (177 lb 11.1 oz) 81.4 kg (179 lb 7.3 oz) 81.2 kg (179 lb 0.2 oz)    Exam:  General: Alert, awake, oriented x3, in no acute distress.  HEENT: No bruits, no goiter.  Heart: Regular rate and rhythm. Lungs: Good air movement, clear Abdomen: Soft, no tenderness. Neuro: Grossly intact, nonfocal.   Data Reviewed: Basic Metabolic Panel:  Recent Labs Lab 01/26/15 2232 01/27/15 0850 01/28/15 0540 01/29/15 0350 01/30/15 0502  NA 137 139 134* 135 139  K 4.1 4.6 4.2 4.6 4.3  CL 104 106 104 105 107  CO2  --  25 19* 19* 25  GLUCOSE 208* 147* 101* 90 119*  BUN 33* 28* 28* 29* 23*  CREATININE 1.90* 1.97* 2.75* 2.82* 2.58*  CALCIUM  --  8.2* 7.5* 7.6* 8.0*   Liver  Function Tests:  Recent Labs Lab 01/27/15 0850  AST 15  ALT 10*  ALKPHOS 37*  BILITOT 0.5  PROT 5.4*  ALBUMIN 2.7*   No results for input(s): LIPASE, AMYLASE in the last 168 hours. No results for input(s): AMMONIA in the last 168 hours. CBC:  Recent Labs Lab 01/26/15 2219 01/26/15 2232 01/27/15 0850 01/28/15 0540 01/29/15 0350 01/30/15 0502  WBC 18.5*  --  15.2* 18.4* 17.6* 12.8*  NEUTROABS 14.1*  --   --   --   --   --   HGB 10.8* 11.9* 9.5* 8.6* 8.8* 8.2*  HCT 34.2* 35.0* 29.6* 27.4* 27.8* 25.3*  MCV 87.0  --  86.5 87.3 85.8 83.8  PLT 400  --  329 312 288 283   Cardiac Enzymes: No results for input(s): CKTOTAL, CKMB, CKMBINDEX, TROPONINI in the last 168 hours. BNP (last 3 results) No results for input(s): BNP in the last 8760 hours.  ProBNP (last 3 results) No results for input(s): PROBNP in the last 8760 hours.  CBG:  Recent Labs Lab 01/29/15 0709 01/29/15 1140 01/29/15 1729 01/29/15 2046 01/30/15 0752  GLUCAP 84 170* 132* 84 104*    Recent Results (from the past 240 hour(s))  Blood culture (routine x 2)     Status: None (  Preliminary result)   Collection Time: 01/27/15 12:08 AM  Result Value Ref Range Status   Specimen Description BLOOD RIGHT ANTECUBITAL  Final   Special Requests BOTTLES DRAWN AEROBIC AND ANAEROBIC 10CC EA  Final   Culture   Final           BLOOD CULTURE RECEIVED NO GROWTH TO DATE CULTURE WILL BE HELD FOR 5 DAYS BEFORE ISSUING A FINAL NEGATIVE REPORT Performed at [facility]    Report Status PENDING  Incomplete  Blood culture (routine x 2)     Status: None (Preliminary result)   Collection Time: 01/27/15 12:18 AM  Result Value Ref Range Status   Specimen Description BLOOD LEFT ANTECUBITAL  Final   Special Requests BOTTLES DRAWN AEROBIC AND ANAEROBIC 5CC EA  Final   Culture   Final           BLOOD CULTURE RECEIVED NO GROWTH TO DATE CULTURE WILL BE HELD FOR 5 DAYS BEFORE ISSUING A FINAL NEGATIVE REPORT Performed at  [facility]    Report Status PENDING  Incomplete  Urine culture     Status: None   Collection Time: 01/27/15 12:45 AM  Result Value Ref Range Status   Specimen Description URINE, CLEAN CATCH  Final   Special Requests NONE  Final   Colony Count   Final    >=100,000 COLONIES/ML Performed at [facility]    Culture   Final    ESCHERICHIA COLI Performed at [facility]    Report Status 01/29/2015 FINAL  Final   Organism ID, Bacteria ESCHERICHIA COLI  Final      Susceptibility   Escherichia coli - MIC*    AMPICILLIN 8 SENSITIVE Sensitive     CEFAZOLIN <=4 SENSITIVE Sensitive     CEFTRIAXONE <=1 SENSITIVE Sensitive     CIPROFLOXACIN <=0.25 SENSITIVE Sensitive     GENTAMICIN <=1 SENSITIVE Sensitive     LEVOFLOXACIN <=0.12 SENSITIVE Sensitive     NITROFURANTOIN <=16 SENSITIVE Sensitive     TOBRAMYCIN <=1 SENSITIVE Sensitive     TRIMETH/SULFA <=20 SENSITIVE Sensitive     PIP/TAZO <=4 SENSITIVE Sensitive     * ESCHERICHIA COLI     Studies: No results found.  Scheduled Meds: . [START ON 01/31/2015] cefTAZidime (FORTAZ)  IV  1 g Intravenous Q24H  . insulin aspart  0-5 Units Subcutaneous QHS  . insulin aspart  0-9 Units Subcutaneous TID WC  . insulin aspart  3 Units Subcutaneous TID WC   Continuous Infusions:    Time Spent: 25 min   Charlynne Cousins  [facility] Pager [phone]. If 7PM-7AM, please contact night-coverage at [email or web address], password [facility] 01/30/2015, 1:00 PM  LOS: 3 days

## 2015-01-31 ENCOUNTER — Inpatient Hospital Stay (HOSPITAL_COMMUNITY): Payer: Medicare PPO

## 2015-01-31 LAB — CBC
HEMATOCRIT: 26.4 % — AB (ref 36.0–46.0)
Hemoglobin: 8.3 g/dL — ABNORMAL LOW (ref 12.0–15.0)
MCH: 26.5 pg (ref 26.0–34.0)
MCHC: 31.4 g/dL (ref 30.0–36.0)
MCV: 84.3 fL (ref 78.0–100.0)
Platelets: 330 10*3/uL (ref 150–400)
RBC: 3.13 MIL/uL — ABNORMAL LOW (ref 3.87–5.11)
RDW: 14.8 % (ref 11.5–15.5)
WBC: 9.7 10*3/uL (ref 4.0–10.5)

## 2015-01-31 LAB — GLUCOSE, CAPILLARY
GLUCOSE-CAPILLARY: 97 mg/dL (ref 65–99)
GLUCOSE-CAPILLARY: 106 mg/dL — AB (ref 65–99)
Glucose-Capillary: 112 mg/dL — ABNORMAL HIGH (ref 65–99)
Glucose-Capillary: 119 mg/dL — ABNORMAL HIGH (ref 65–99)

## 2015-01-31 LAB — BASIC METABOLIC PANEL
Anion gap: 7 (ref 5–15)
BUN: 21 mg/dL — AB (ref 6–20)
CHLORIDE: 105 mmol/L (ref 101–111)
CO2: 29 mmol/L (ref 22–32)
Calcium: 7.9 mg/dL — ABNORMAL LOW (ref 8.9–10.3)
Creatinine, Ser: 2.17 mg/dL — ABNORMAL HIGH (ref 0.44–1.00)
GFR calc non Af Amer: 20 mL/min — ABNORMAL LOW (ref >60)
GFR, EST AFRICAN AMERICAN: 24 mL/min — AB (ref >60)
Glucose, Bld: 117 mg/dL — ABNORMAL HIGH (ref 65–99)
Potassium: 4 mmol/L (ref 3.5–5.1)
SODIUM: 141 mmol/L (ref 135–145)

## 2015-01-31 MED ORDER — CIPROFLOXACIN HCL 500 MG PO TABS
500.0000 mg | ORAL_TABLET | Freq: Two times a day (BID) | ORAL | Status: DC
  Administered 2015-01-31 – 2015-02-01 (×3): 500 mg via ORAL
  Filled 2015-01-31 (×3): qty 1

## 2015-01-31 MED ORDER — FUROSEMIDE 10 MG/ML IJ SOLN
20.0000 mg | Freq: Once | INTRAMUSCULAR | Status: AC
  Administered 2015-01-31: 20 mg via INTRAVENOUS
  Filled 2015-01-31: qty 2

## 2015-01-31 NOTE — Progress Notes (Signed)
[facility] PROGRESS NOTE  Assessment/Plan: Pyelonephritis/nre renal abscess: - started on empiric rocephin on 6.7.2016 transition to [place] on 6.8.2016. - U.C. Grew e.coli pan sensitive. De-esc late antibiotics to cipro.  Acute-on-chronic kidney injury - Cr has improved. - KVO IV fluids    Diabetes mellitus without complication - d/c metformin start SSI. - resume diet.    Code Status: full Family Communication: none  Disposition Plan: inpatient home in am.   Consultants:  none  Procedures:  CT abd  Antibiotics:  Fortaz  HPI/Subjective: No complains  Objective: Filed Vitals:   01/30/15 0636 01/30/15 1412 01/30/15 2209 01/31/15 0547  BP: 160/64 163/68 165/67 172/68  Pulse: 85 92 85 90  Temp: 98.4 F (36.9 C) 98.8 F (37.1 C) 98.8 F (37.1 C) 98.3 F (36.8 C)  TempSrc: Oral Oral Oral Oral  Resp: 18 18 17 18   Height:      Weight: 81.2 kg (179 lb 0.2 oz)   84.732 kg (186 lb 12.8 oz)  SpO2: 97% 98% 99% 98%    Intake/Output Summary (Last 24 hours) at 01/31/15 1009 Last data filed at 01/31/15 0549  Gross per 24 hour  Intake    660 ml  Output      0 ml  Net    660 ml   Filed Weights   01/29/15 0556 01/30/15 0636 01/31/15 0547  Weight: 81.4 kg (179 lb 7.3 oz) 81.2 kg (179 lb 0.2 oz) 84.732 kg (186 lb 12.8 oz)    Exam:  General: Alert, awake, oriented x3, in no acute distress.  HEENT: No bruits, no goiter.  Heart: Regular rate and rhythm. Lungs: Good air movement, clear Abdomen: Soft, no tenderness. Neuro: Grossly intact, nonfocal.   Data Reviewed: Basic Metabolic Panel:  Recent Labs Lab 01/27/15 0850 01/28/15 0540 01/29/15 0350 01/30/15 0502 01/31/15 0443  NA 139 134* 135 139 141  K 4.6 4.2 4.6 4.3 4.0  CL 106 104 105 107 105  CO2 25 19* 19* 25 29  GLUCOSE 147* 101* 90 119* 117*  BUN 28* 28* 29* 23* 21*  CREATININE 1.97* 2.75* 2.82* 2.58* 2.17*  CALCIUM 8.2* 7.5* 7.6* 8.0* 7.9*   Liver Function Tests:  Recent Labs Lab  01/27/15 0850  AST 15  ALT 10*  ALKPHOS 37*  BILITOT 0.5  PROT 5.4*  ALBUMIN 2.7*   No results for input(s): LIPASE, AMYLASE in the last 168 hours. No results for input(s): AMMONIA in the last 168 hours. CBC:  Recent Labs Lab 01/26/15 2219 01/26/15 2232 01/27/15 0850 01/28/15 0540 01/29/15 0350 01/30/15 0502  WBC 18.5*  --  15.2* 18.4* 17.6* 12.8*  NEUTROABS 14.1*  --   --   --   --   --   HGB 10.8* 11.9* 9.5* 8.6* 8.8* 8.2*  HCT 34.2* 35.0* 29.6* 27.4* 27.8* 25.3*  MCV 87.0  --  86.5 87.3 85.8 83.8  PLT 400  --  329 312 288 283   Cardiac Enzymes: No results for input(s): CKTOTAL, CKMB, CKMBINDEX, TROPONINI in the last 168 hours. BNP (last 3 results) No results for input(s): BNP in the last 8760 hours.  ProBNP (last 3 results) No results for input(s): PROBNP in the last 8760 hours.  CBG:  Recent Labs Lab 01/30/15 1321 01/30/15 1648 01/30/15 1705 01/30/15 2207 01/31/15 0752  GLUCAP 126* 69 74 125* 112*    Recent Results (from the past 240 hour(s))  Blood culture (routine x 2)     Status: None (Preliminary result)  Collection Time: 01/27/15 12:08 AM  Result Value Ref Range Status   Specimen Description BLOOD RIGHT ANTECUBITAL  Final   Special Requests BOTTLES DRAWN AEROBIC AND ANAEROBIC 10CC EA  Final   Culture   Final           BLOOD CULTURE RECEIVED NO GROWTH TO DATE CULTURE WILL BE HELD FOR 5 DAYS BEFORE ISSUING A FINAL NEGATIVE REPORT Performed at [facility]    Report Status PENDING  Incomplete  Blood culture (routine x 2)     Status: None (Preliminary result)   Collection Time: 01/27/15 12:18 AM  Result Value Ref Range Status   Specimen Description BLOOD LEFT ANTECUBITAL  Final   Special Requests BOTTLES DRAWN AEROBIC AND ANAEROBIC 5CC EA  Final   Culture   Final           BLOOD CULTURE RECEIVED NO GROWTH TO DATE CULTURE WILL BE HELD FOR 5 DAYS BEFORE ISSUING A FINAL NEGATIVE REPORT Performed at [facility]    Report Status  PENDING  Incomplete  Urine culture     Status: None   Collection Time: 01/27/15 12:45 AM  Result Value Ref Range Status   Specimen Description URINE, CLEAN CATCH  Final   Special Requests NONE  Final   Colony Count   Final    >=100,000 COLONIES/ML Performed at [facility]    Culture   Final    ESCHERICHIA COLI Performed at [facility]    Report Status 01/29/2015 FINAL  Final   Organism ID, Bacteria ESCHERICHIA COLI  Final      Susceptibility   Escherichia coli - MIC*    AMPICILLIN 8 SENSITIVE Sensitive     CEFAZOLIN <=4 SENSITIVE Sensitive     CEFTRIAXONE <=1 SENSITIVE Sensitive     CIPROFLOXACIN <=0.25 SENSITIVE Sensitive     GENTAMICIN <=1 SENSITIVE Sensitive     LEVOFLOXACIN <=0.12 SENSITIVE Sensitive     NITROFURANTOIN <=16 SENSITIVE Sensitive     TOBRAMYCIN <=1 SENSITIVE Sensitive     TRIMETH/SULFA <=20 SENSITIVE Sensitive     PIP/TAZO <=4 SENSITIVE Sensitive     * ESCHERICHIA COLI     Studies: US Renal  01/31/2015   CLINICAL DATA:  Renal abscess.  EXAM: RENAL / URINARY TRACT ULTRASOUND COMPLETE  COMPARISON:  CT abdomen and pelvis 01/27/2015  FINDINGS: Right Kidney:  Length: 11.2 cm. No hydronephrosis. A subcapsular collection is again seen, resulting in distortion of the renal parenchyma. The central portion of the collection appears to represent hypo to anechoic fluid measuring 4.8 x 0.9 x 4.2 cm. Surrounding this central hypoechoic fluid is thick hyperechoic material, with the short axis of the collection measuring at least 2.3 cm at the level of the upper pole to interpolar region, similar to that measured on the prior CT, and likely measuring closer to 3.5 cm at the level of the lower pole. The overall size and configuration is similar to that on the prior CT allowing for differences in modality.  Left Kidney:  Length: 10.2 cm. Echogenic parenchyma a suggestive of medical renal disease. No hydronephrosis. 1.6 cm hypoechoic lesion in the interpolar kidney  corresponds to a low-density lesion on CT, most likely a cyst.  Bladder:  Appears normal for degree of bladder distention.  A right pleural effusion is partially visualized.  IMPRESSION: 1. Subcapsular right renal collection, overall grossly similar in size to recent CT. 2. Increased echogenicity of the left kidney suggesting medical renal disease. 3. Right pleural effusion.  Electronically Signed   By: Logan Bores   On: 01/31/2015 07:40    Scheduled Meds: . ciprofloxacin  500 mg Oral BID  . insulin aspart  0-5 Units Subcutaneous QHS  . insulin aspart  0-9 Units Subcutaneous TID WC  . insulin aspart  3 Units Subcutaneous TID WC   Continuous Infusions:    Time Spent: 25 min   Charlynne Cousins  [facility] Pager [phone]. If 7PM-7AM, please contact night-coverage at [email or web address], password [facility] 01/31/2015, 10:09 AM  LOS: 4 days

## 2015-02-01 LAB — BASIC METABOLIC PANEL
Anion gap: 7 (ref 5–15)
BUN: 20 mg/dL (ref 6–20)
CHLORIDE: 106 mmol/L (ref 101–111)
CO2: 31 mmol/L (ref 22–32)
Calcium: 8.5 mg/dL — ABNORMAL LOW (ref 8.9–10.3)
Creatinine, Ser: 2.12 mg/dL — ABNORMAL HIGH (ref 0.44–1.00)
GFR, EST AFRICAN AMERICAN: 24 mL/min — AB (ref >60)
GFR, EST NON AFRICAN AMERICAN: 21 mL/min — AB (ref >60)
Glucose, Bld: 103 mg/dL — ABNORMAL HIGH (ref 65–99)
Potassium: 5.3 mmol/L — ABNORMAL HIGH (ref 3.5–5.1)
Sodium: 144 mmol/L (ref 135–145)

## 2015-02-01 LAB — GLUCOSE, CAPILLARY: GLUCOSE-CAPILLARY: 99 mg/dL (ref 65–99)

## 2015-02-01 MED ORDER — CIPROFLOXACIN HCL 500 MG PO TABS: 500.0000 mg | ORAL_TABLET | Freq: Two times a day (BID) | ORAL | Status: DC

## 2015-02-01 MED ORDER — SODIUM POLYSTYRENE SULFONATE 15 GM/60ML PO SUSP
15.0000 g | Freq: Once | ORAL | Status: AC
  Administered 2015-02-01: 15 g via ORAL
  Filled 2015-02-01: qty 60

## 2015-02-01 MED ORDER — SODIUM CHLORIDE 0.9 % IV BOLUS (SEPSIS)
500.0000 mL | Freq: Once | INTRAVENOUS | Status: AC
  Administered 2015-02-01: 500 mL via INTRAVENOUS

## 2015-02-01 MED ORDER — METFORMIN HCL 500 MG PO TABS: 500.0000 mg | ORAL_TABLET | Freq: Two times a day (BID) | ORAL | Status: DC

## 2015-02-01 NOTE — Discharge Summary (Signed)
Physician Discharge Summary  Jill [name] QHU:[id number] DOB: 09-27-1931 DOA: 01/26/2015  PCP: Eliezer Lofts, MD  Admit date: 01/26/2015 Discharge date: 02/01/2015  Time spent: 35  minutes  Recommendations for Outpatient Follow-up:  1. Follow up with PCP in 1 week check a b-met to monitor Cr and K.   Discharge Diagnoses:  Principal Problem:   Pyelonephritis Active Problems:   Diabetes mellitus without complication   Chronic idiopathic urticaria   Acute-on-chronic kidney injury   Renal abscess   Discharge Condition: stable  Diet recommendation: ADA  Filed Weights   01/30/15 0636 01/31/15 0547 02/01/15 0620  Weight: 81.2 kg (179 lb 0.2 oz) 84.732 kg (186 lb 12.8 oz) 82.056 kg (180 lb 14.4 oz)    History of present illness:  81 y.o. female with Past medical history of diabetes mellitus, chronic kidney disease, hypertension. The patient is presenting with complaints of nausea vomiting started tonight associated with fever and chills. She also has some cough which was nonproductive. The patient denies any complaints of back pain burning urination but she feels she has been urinating more than her usual. She denies any diarrhea or constipation denies any chest pain, or abdominal pain or flank pain. She denies any similar symptoms in the past. Recently she was placed on Lexapro instead of citalopram.  Hospital Course:  Pyelonephritis/nre renal abscess: - started on empiric rocephin on 6.7.2016 transition to [place] on 6.8.2016. - U.C. Grew e.coli pan sensitive. has defervesce, leukocytosis is improving. - transition to oral cipro monitor overnight with no fever.  Acute-on-chronic kidney injury - Cr has improved. - Due to infectious etiology - b-met PCP in 1 week.   Diabetes mellitus without complication - d/c metformin start SSI. - resume diet. - restart metformin once cr return to baseline.  Procedures:  CT renal: showed abscess  Consultations:  none  Discharge  Exam: Filed Vitals:   02/01/15 0620  BP: 167/65  Pulse: 85  Temp: 98.7 F (37.1 C)  Resp: 16    General: A&O x3 Cardiovascular: RRR Respiratory: good air movement CTA B/L  Discharge Instructions   Discharge Instructions    Diet - low sodium heart healthy    Complete by:  As directed      Increase activity slowly    Complete by:  As directed           Current Discharge Medication List    START taking these medications   Details  ciprofloxacin (CIPRO) 500 MG tablet Take 1 tablet (500 mg total) by mouth 2 (two) times daily. Qty: 18 tablet, Refills: 0      CONTINUE these medications which have CHANGED   Details  metFORMIN (GLUCOPHAGE) 500 MG tablet Take 1 tablet (500 mg total) by mouth 2 (two) times daily with a meal.      CONTINUE these medications which have NOT CHANGED   Details  ALPRAZolam (XANAX) 0.25 MG tablet Take 1 tablet (0.25 mg total) by mouth 2 (two) times daily as needed for anxiety. Qty: 20 tablet, Refills: 0    aspirin EC 325 MG tablet Take 1 tablet (325 mg total) by mouth 2 (two) times daily. Qty: 30 tablet, Refills: 0    escitalopram (LEXAPRO) 20 MG tablet Take 1 tablet (20 mg total) by mouth daily. Qty: 30 tablet, Refills: 11    fenofibrate 54 MG tablet TAKE TWO (2) TABLETS BY MOUTH DAILY Qty: 180 tablet, Refills: 1       Allergies  Allergen Reactions  . Lovastatin  REACTION: leg pain  . Niacin   . Statins     REACTION: leg cramps, weakness  . Sulfa Antibiotics Hives and Itching  . Codeine Rash   Follow-up Information    Follow up with Eliezer Lofts, MD In 1 week.   Specialty:  Family Medicine   Why:  hospital follow up check b-met   Contact information:   [place] [address] [phone]        The results of significant diagnostics from this hospitalization (including imaging, microbiology, ancillary and laboratory) are listed below for reference.    Significant Diagnostic Studies: US  Renal  01/31/2015   CLINICAL DATA:  Renal abscess.  EXAM: RENAL / URINARY TRACT ULTRASOUND COMPLETE  COMPARISON:  CT abdomen and pelvis 01/27/2015  FINDINGS: Right Kidney:  Length: 11.2 cm. No hydronephrosis. A subcapsular collection is again seen, resulting in distortion of the renal parenchyma. The central portion of the collection appears to represent hypo to anechoic fluid measuring 4.8 x 0.9 x 4.2 cm. Surrounding this central hypoechoic fluid is thick hyperechoic material, with the short axis of the collection measuring at least 2.3 cm at the level of the upper pole to interpolar region, similar to that measured on the prior CT, and likely measuring closer to 3.5 cm at the level of the lower pole. The overall size and configuration is similar to that on the prior CT allowing for differences in modality.  Left Kidney:  Length: 10.2 cm. Echogenic parenchyma a suggestive of medical renal disease. No hydronephrosis. 1.6 cm hypoechoic lesion in the interpolar kidney corresponds to a low-density lesion on CT, most likely a cyst.  Bladder:  Appears normal for degree of bladder distention.  A right pleural effusion is partially visualized.  IMPRESSION: 1. Subcapsular right renal collection, overall grossly similar in size to recent CT. 2. Increased echogenicity of the left kidney suggesting medical renal disease. 3. Right pleural effusion.   Electronically Signed   By: Logan Bores   On: 01/31/2015 07:40   Dg Abd Acute W/chest  01/27/2015   CLINICAL DATA:  Emesis today.  Fever of 104.  EXAM: DG ABDOMEN ACUTE W/ 1V CHEST  COMPARISON:  None.  FINDINGS: There is no evidence of dilated bowel loops or free intraperitoneal air. Mildly prominent mid abdominal small bowel loops are nondistended. No radiopaque calculi or other significant radiographic abnormality is seen. Heart size and mediastinal contours are within normal limits. Both lungs are clear. Calcified tortuous aorta. Moderate stool burden. BILATERAL hip  replacements. Lumbar spine degenerative disc disease.  IMPRESSION: Mildly prominent mid abdominal small bowel loops without obstruction. No visible free air.  No active cardiopulmonary disease.   Electronically Signed   By: Rolla Flatten M.D.   On: 01/27/2015 01:15   Ct Renal Stone Study  01/27/2015   CLINICAL DATA:  Sudden onset of right flank pain.  EXAM: CT ABDOMEN AND PELVIS WITHOUT CONTRAST  TECHNIQUE: Multidetector CT imaging of the abdomen and pelvis was performed following the standard protocol without IV contrast.  COMPARISON:  None.  FINDINGS: There is a 3 mm subpleural nodule in the right lower lobe. Minimal subpleural fibrosis in the right middle lobe and lingula.  There is moderate perinephric stranding about the right kidney. Subcapsular renal collection measuring 2.3 cm causing compression of the renal parenchyma. The right renal collecting system is poorly defined. There is a 1.3 cm hypodensity in the lower right kidney. No definite stone. The right ureter were visible is  decompressed without stone. There is thinning of the left renal parenchyma without hydronephrosis or stone evaluation of the distal ureters is obscured by streak artifact from hip prostheses.  Evaluation of the remaining solid and hollow viscera is limited given lack of contrast. The unenhanced liver, gallbladder, spleen, and left adrenal gland are normal. Pancreas is atrophic without ductal dilatation. Right adrenal gland is obscured.  Small hiatal hernia. Stomach is decompressed. There are no dilated or thickened bowel loops. Diverticulosis of the distal colon without diverticulitis. The appendix is normal. No colonic wall thickening.  No retroperitoneal adenopathy. Abdominal aorta is normal in caliber. Moderate to dense atherosclerosis of the abdominal aorta without aneurysm.  Pelvic not any is obscured by streak artifact and hip prosthesis. The uterus appears surgically absent.  Diffuse degenerative change throughout the spine  with degenerative disc disease and facet arthropathy.  IMPRESSION: 1. Heterogeneous of capsule right renal collection measuring up to 2.3 cm causing compression of the renal parenchyma and distorting normal corticomedullary anatomy. There is moderate adjacent perinephric stranding. Findings may reflect a subcapsular hematoma, however additional subcapsular collections such as ruptured cyst or infected fluid collection are not excluded. Contrast-enhanced exam or MRI recommended if patient is able tolerate. There is no urolithiasis. 2. Incidental findings of the small hiatal hernia. Diverticulosis without diverticulitis.  These results were called by telephone at the time of interpretation on 01/27/2015 at 6:51 am to Dr. Berle Mull , who verbally acknowledged these results.   Electronically Signed   By: Jeb Levering M.D.   On: 01/27/2015 06:53    Microbiology: Recent Results (from the past 240 hour(s))  Blood culture (routine x 2)     Status: None (Preliminary result)   Collection Time: 01/27/15 12:08 AM  Result Value Ref Range Status   Specimen Description BLOOD RIGHT ANTECUBITAL  Final   Special Requests BOTTLES DRAWN AEROBIC AND ANAEROBIC 10CC EA  Final   Culture   Final           BLOOD CULTURE RECEIVED NO GROWTH TO DATE CULTURE WILL BE HELD FOR 5 DAYS BEFORE ISSUING A FINAL NEGATIVE REPORT Performed at [facility]    Report Status PENDING  Incomplete  Blood culture (routine x 2)     Status: None (Preliminary result)   Collection Time: 01/27/15 12:18 AM  Result Value Ref Range Status   Specimen Description BLOOD LEFT ANTECUBITAL  Final   Special Requests BOTTLES DRAWN AEROBIC AND ANAEROBIC 5CC EA  Final   Culture   Final           BLOOD CULTURE RECEIVED NO GROWTH TO DATE CULTURE WILL BE HELD FOR 5 DAYS BEFORE ISSUING A FINAL NEGATIVE REPORT Performed at [facility]    Report Status PENDING  Incomplete  Urine culture     Status: None   Collection Time: 01/27/15 12:45  AM  Result Value Ref Range Status   Specimen Description URINE, CLEAN CATCH  Final   Special Requests NONE  Final   Colony Count   Final    >=100,000 COLONIES/ML Performed at [facility]    Culture   Final    ESCHERICHIA COLI Performed at [facility]    Report Status 01/29/2015 FINAL  Final   Organism ID, Bacteria ESCHERICHIA COLI  Final      Susceptibility   Escherichia coli - MIC*    AMPICILLIN 8 SENSITIVE Sensitive     CEFAZOLIN <=4 SENSITIVE Sensitive     CEFTRIAXONE <=1 SENSITIVE Sensitive  CIPROFLOXACIN <=0.25 SENSITIVE Sensitive     GENTAMICIN <=1 SENSITIVE Sensitive     LEVOFLOXACIN <=0.12 SENSITIVE Sensitive     NITROFURANTOIN <=16 SENSITIVE Sensitive     TOBRAMYCIN <=1 SENSITIVE Sensitive     TRIMETH/SULFA <=20 SENSITIVE Sensitive     PIP/TAZO <=4 SENSITIVE Sensitive     * ESCHERICHIA COLI     Labs: Basic Metabolic Panel:  Recent Labs Lab 01/28/15 0540 01/29/15 0350 01/30/15 0502 01/31/15 0443 02/01/15 0425  NA 134* 135 139 141 144  K 4.2 4.6 4.3 4.0 5.3*  CL 104 105 107 105 106  CO2 19* 19* _0 GLUCOSE 101* 90 119* 117* 103*  BUN 28* 29* 23* 21* 20  CREATININE 2.75* 2.82* 2.58* 2.17* 2.12*  CALCIUM 7.5* 7.6* 8.0* 7.9* 8.5*   Liver Function Tests:  Recent Labs Lab 01/27/15 0850  AST 15  ALT 10*  ALKPHOS 37*  BILITOT 0.5  PROT 5.4*  ALBUMIN 2.7*   No results for input(s): LIPASE, AMYLASE in the last 168 hours. No results for input(s): AMMONIA in the last 168 hours. CBC:  Recent Labs Lab 01/26/15 2219  01/27/15 0850 01/28/15 0540 01/29/15 0350 01/30/15 0502 01/31/15 1158  WBC 18.5*  --  15.2* 18.4* 17.6* 12.8* 9.7  NEUTROABS 14.1*  --   --   --   --   --   --   HGB 10.8*  < > 9.5* 8.6* 8.8* 8.2* 8.3*  HCT 34.2*  < > 29.6* 27.4* 27.8* 25.3* 26.4*  MCV 87.0  --  86.5 87.3 85.8 83.8 84.3  PLT 400  --  329 312 288 283 330  < > = values in this interval not displayed. Cardiac Enzymes: No results for  input(s): CKTOTAL, CKMB, CKMBINDEX, TROPONINI in the last 168 hours. BNP: BNP (last 3 results) No results for input(s): BNP in the last 8760 hours.  ProBNP (last 3 results) No results for input(s): PROBNP in the last 8760 hours.  CBG:  Recent Labs Lab 01/30/15 2207 01/31/15 0752 01/31/15 1147 01/31/15 1659 01/31/15 2223  GLUCAP 125* 112* 97 106* 119*       Signed:  FELIZ ORTIZ, ABRAHAM  [facility] 02/01/2015, 7:01 AM

## 2015-02-02 LAB — CULTURE, BLOOD (ROUTINE X 2)
CULTURE: NO GROWTH
CULTURE: NO GROWTH

## 2015-02-03 ENCOUNTER — Emergency Department (HOSPITAL_COMMUNITY): Payer: Medicare PPO

## 2015-02-03 ENCOUNTER — Encounter (HOSPITAL_COMMUNITY): Payer: Self-pay | Admitting: Emergency Medicine

## 2015-02-03 ENCOUNTER — Emergency Department (HOSPITAL_COMMUNITY)
Admission: EM | Admit: 2015-02-03 | Discharge: 2015-02-03 | Disposition: A | Discharge status: Home / Self Care | Payer: Medicare PPO | Attending: Emergency Medicine | Admitting: Emergency Medicine

## 2015-02-03 ENCOUNTER — Telehealth: Payer: Self-pay | Admitting: Family Medicine

## 2015-02-03 DIAGNOSIS — Z87440 Personal history of urinary (tract) infections: Secondary | ICD-10-CM | POA: Diagnosis not present

## 2015-02-03 DIAGNOSIS — N189 Chronic kidney disease, unspecified: Secondary | ICD-10-CM | POA: Insufficient documentation

## 2015-02-03 DIAGNOSIS — R600 Localized edema: Secondary | ICD-10-CM | POA: Insufficient documentation

## 2015-02-03 DIAGNOSIS — I129 Hypertensive chronic kidney disease with stage 1 through stage 4 chronic kidney disease, or unspecified chronic kidney disease: Secondary | ICD-10-CM | POA: Diagnosis not present

## 2015-02-03 DIAGNOSIS — E119 Type 2 diabetes mellitus without complications: Secondary | ICD-10-CM | POA: Insufficient documentation

## 2015-02-03 DIAGNOSIS — R0602 Shortness of breath: Secondary | ICD-10-CM | POA: Diagnosis not present

## 2015-02-03 DIAGNOSIS — Z7982 Long term (current) use of aspirin: Secondary | ICD-10-CM | POA: Insufficient documentation

## 2015-02-03 DIAGNOSIS — Z87891 Personal history of nicotine dependence: Secondary | ICD-10-CM | POA: Insufficient documentation

## 2015-02-03 DIAGNOSIS — M1990 Unspecified osteoarthritis, unspecified site: Secondary | ICD-10-CM | POA: Insufficient documentation

## 2015-02-03 DIAGNOSIS — Z8719 Personal history of other diseases of the digestive system: Secondary | ICD-10-CM | POA: Diagnosis not present

## 2015-02-03 DIAGNOSIS — F329 Major depressive disorder, single episode, unspecified: Secondary | ICD-10-CM | POA: Diagnosis not present

## 2015-02-03 LAB — URINALYSIS, ROUTINE W REFLEX MICROSCOPIC
BILIRUBIN URINE: NEGATIVE
Glucose, UA: NEGATIVE mg/dL
Ketones, ur: NEGATIVE mg/dL
Leukocytes, UA: NEGATIVE
NITRITE: NEGATIVE
PROTEIN: 100 mg/dL — AB
Specific Gravity, Urine: 1.012 (ref 1.005–1.030)
Urobilinogen, UA: 0.2 mg/dL (ref 0.0–1.0)
pH: 7 (ref 5.0–8.0)

## 2015-02-03 LAB — BASIC METABOLIC PANEL
Anion gap: 8 (ref 5–15)
BUN: 18 mg/dL (ref 6–20)
CHLORIDE: 105 mmol/L (ref 101–111)
CO2: 28 mmol/L (ref 22–32)
CREATININE: 1.97 mg/dL — AB (ref 0.44–1.00)
Calcium: 8.8 mg/dL — ABNORMAL LOW (ref 8.9–10.3)
GFR calc Af Amer: 26 mL/min — ABNORMAL LOW (ref >60)
GFR calc non Af Amer: 23 mL/min — ABNORMAL LOW (ref >60)
Glucose, Bld: 130 mg/dL — ABNORMAL HIGH (ref 65–99)
Potassium: 4.3 mmol/L (ref 3.5–5.1)
Sodium: 141 mmol/L (ref 135–145)

## 2015-02-03 LAB — CBC WITH DIFFERENTIAL/PLATELET
BASOS PCT: 1 % (ref 0–1)
Basophils Absolute: 0 10*3/uL (ref 0.0–0.1)
EOS PCT: 2 % (ref 0–5)
Eosinophils Absolute: 0.2 10*3/uL (ref 0.0–0.7)
HCT: 26.9 % — ABNORMAL LOW (ref 36.0–46.0)
Hemoglobin: 8.3 g/dL — ABNORMAL LOW (ref 12.0–15.0)
Lymphocytes Relative: 24 % (ref 12–46)
Lymphs Abs: 1.8 10*3/uL (ref 0.7–4.0)
MCH: 26.6 pg (ref 26.0–34.0)
MCHC: 30.9 g/dL (ref 30.0–36.0)
MCV: 86.2 fL (ref 78.0–100.0)
MONO ABS: 0.7 10*3/uL (ref 0.1–1.0)
Monocytes Relative: 9 % (ref 3–12)
NEUTROS PCT: 64 % (ref 43–77)
Neutro Abs: 4.7 10*3/uL (ref 1.7–7.7)
PLATELETS: 356 10*3/uL (ref 150–400)
RBC: 3.12 MIL/uL — ABNORMAL LOW (ref 3.87–5.11)
RDW: 14.9 % (ref 11.5–15.5)
WBC: 7.4 10*3/uL (ref 4.0–10.5)

## 2015-02-03 LAB — I-STAT TROPONIN, ED: Troponin i, poc: 0.03 ng/mL (ref 0.00–0.08)

## 2015-02-03 LAB — TROPONIN I: TROPONIN I: 0.06 ng/mL — AB (ref <0.031)

## 2015-02-03 LAB — D-DIMER, QUANTITATIVE (NOT AT ARMC): D DIMER QUANT: 5.8 ug{FEU}/mL — AB (ref 0.00–0.48)

## 2015-02-03 LAB — URINE MICROSCOPIC-ADD ON

## 2015-02-03 LAB — BRAIN NATRIURETIC PEPTIDE: B Natriuretic Peptide: 575.9 pg/mL — ABNORMAL HIGH (ref 0.0–100.0)

## 2015-02-03 MED ORDER — FUROSEMIDE 20 MG PO TABS: 20.0000 mg | ORAL_TABLET | Freq: Every day | ORAL | Status: DC

## 2015-02-03 MED ORDER — TECHNETIUM TC 99M DIETHYLENETRIAME-PENTAACETIC ACID: 40.0000 | Freq: Once | INTRAVENOUS | Status: DC | PRN

## 2015-02-03 MED ORDER — PREDNISONE 20 MG PO TABS
60.0000 mg | ORAL_TABLET | Freq: Once | ORAL | Status: AC
  Administered 2015-02-03: 60 mg via ORAL
  Filled 2015-02-03: qty 3

## 2015-02-03 MED ORDER — IPRATROPIUM-ALBUTEROL 0.5-2.5 (3) MG/3ML IN SOLN
3.0000 mL | Freq: Once | RESPIRATORY_TRACT | Status: AC
  Administered 2015-02-03: 3 mL via RESPIRATORY_TRACT
  Filled 2015-02-03: qty 3

## 2015-02-03 MED ORDER — TECHNETIUM TO 99M ALBUMIN AGGREGATED
6.0000 | Freq: Once | INTRAVENOUS | Status: AC | PRN
  Administered 2015-02-03: 6 via INTRAVENOUS

## 2015-02-03 NOTE — Telephone Encounter (Signed)
Please call pt at home ASAP get more info about shortness of breath. Any new swelling in legs, cough, fever chest pain?

## 2015-02-03 NOTE — ED Notes (Signed)
Ambulated patient in hallway with the O2 reading between 90-92 Pulse rate was  Between 109-121.

## 2015-02-03 NOTE — Discharge Instructions (Signed)
Take lasix 20 mg daily x 3 days.   See your doctor this week.   Return to ER if you have worse shortness of breath, chest pain, fever.

## 2015-02-03 NOTE — Telephone Encounter (Signed)
PLEASE NOTE: All timestamps contained within this report are represented as [place] Standard Time. CONFIDENTIALTY NOTICE: This fax transmission is intended only for the addressee. It contains information that is legally privileged, confidential or otherwise protected from use or disclosure. If you are not the intended recipient, you are strictly prohibited from reviewing, disclosing, copying using or disseminating any of this information or taking any action in reliance on or regarding this information. If you have received this fax in error, please notify us immediately by telephone so that we can arrange for its return to [place]. Phone: [phone], Toll-Free: [phone], Fax: [phone] Page: 1 of 2 Call Id: [id number] [place] Patient Name: Jill [name] Gender: Female DOB: 1932/03/17 Age: 81 Y 11 M 27 D Return Phone Number: [phone] (Primary) Address: City/State/Zip: [place] Client [place] [facility] - Client Client Site [facility] - Day Physician Diona Browner, [place] Contact Type Call Call Type Triage / Clinical Caller Name Brayanna Riddle Relationship To Patient Daughter Appointment Disposition EMR Appointment Not Necessary Info pasted into Epic Yes Return Phone Number [phone] (Primary) Chief Complaint BREATHING - shortness of breath or sounds breathless Initial Comment caller states her mother just came home from the hospital - had a infection - today is having a problem breathing PreDisposition Did not know what to do Nurse Assessment Nurse: Markus Daft, RN, Sherre Poot Date/Time (Eastern Time): 02/03/2015 9:36:53 AM Confirm and document reason for call. If symptomatic, describe symptoms. ---Caller states her mother just came home from the hospital Sunday afternoon d/t E.Coli infection in her kidneys and an abscess in her kidney also. They believe it  started out as a UTI. Currently taking Cipro BID. C/O dyspnea on exertion and weakness requiring help to ambulate. A little worse today than yesterday. -- Labs during hospitalization: Hgb 8.0 at hospital. U/S does not show improvement like they were told there was. Has the patient traveled out of the country within the last 30 days? ---Not Applicable Does the patient require triage? ---Yes Related visit to physician within the last 2 weeks? ---Yes Does the PT have any chronic conditions? (i.e. diabetes, asthma, etc.) ---Yes List chronic conditions. ---NIDDM - off the Metformin currently, Stage 3 renal failure, HTN Guidelines Guideline Title Affirmed Question Affirmed Notes Nurse Date/Time (Eastern Time) Urinary Tract Infection on Antibiotic Follow-up Call - Female Patient sounds very sick or weak to the triager short of breath on exertion, and takes 10 - 15 minutes to resolve at rest; a little improved now, but still some. Markus Daft, RN, Sherre Poot 02/03/2015 9:39:55 AM PLEASE NOTE: All timestamps contained within this report are represented as [place] Standard Time. CONFIDENTIALTY NOTICE: This fax transmission is intended only for the addressee. It contains information that is legally privileged, confidential or otherwise protected from use or disclosure. If you are not the intended recipient, you are strictly prohibited from reviewing, disclosing, copying using or disseminating any of this information or taking any action in reliance on or regarding this information. If you have received this fax in error, please notify us immediately by telephone so that we can arrange for its return to [place]. Phone: [phone], Toll-Free: [phone], Fax: [phone] Page: 2 of 2 Call Id: [id number] Guidelines Guideline Title Affirmed Question Affirmed Notes Nurse Date/Time [name] Time) Weakness is all over. No CP/Pressure. Disp. Time [name] Time) Disposition Final User 02/03/2015 9:32:56 AM  Send to Urgent Queue Byrd Hesselbach 02/03/2015 9:44:42 AM Go  to ED Now (or PCP triage) Yes Markus Daft, RN, Kenton Kingfisher Understands: Yes Disagree/Comply: Comply Care Advice Given Per Guideline GO TO ED NOW (OR PCP TRIAGE): * IF NO PCP TRIAGE: You need to be seen. Go to the [facility] at _____________ Hospital within the next hour. Leave as soon as you can. BRING MEDICINES: * Please bring a list of your current medicines when you go to the Emergency Department (ER). CARE ADVICE given per Urinary Tract Infection Follow-Up Call, Female (Adult) guideline. After Care Instructions Given Call Event Type User Date / Time Description Referrals REFERRED TO PCP OFFICE [facility] - ED

## 2015-02-03 NOTE — ED Notes (Signed)
Pt. Stated, I've been having SOB more than the usual a lot this morning. Granddaughter was with her and she was really SOB this morning.

## 2015-02-03 NOTE — ED Provider Notes (Signed)
CSN: [id number]     Arrival date & time 02/03/15  1043 History   First MD Initiated Contact with Patient 02/03/15 1101     Chief Complaint  Patient presents with  . Shortness of Breath  . Urinary Tract Infection     (Consider location/radiation/quality/duration/timing/severity/associated sxs/prior Treatment) HPI   Jill [name] is a 81 y.o. female who is here for evaluation of shortness of breath, which is progressive since hospital discharge, and worsened in the last 3 days. She has a cough which is nonproductive. She is having increasing swelling in her legs. She has never had congestive heart failure, but does have renal insufficiency. She is being treated for a abscess in her kidney. She is not currently have fever, chills, nausea or vomiting. No chest pain or back pain. She is taking her usual medications as directed. There are no other known modifying factors.   Past Medical History  Diagnosis Date  . Diabetes mellitus   . Hyperlipidemia   . Chronic kidney disease   . Depression   . Allergy   . Hypertension   . Vasovagal syncope 2006    Negative cardiac workup-myoview, echo  . Diverticula, colon   . Family history of anesthesia complication     Son is difficult intubation  . Complication of anesthesia     difficult waking   . Arthritis     osteoarthritis    Past Surgical History  Procedure Laterality Date  . Abdominal hysterectomy  1977    fibroid  . Joint replacement  2009    rt hip  . Total hip arthroplasty Left 05/12/2014    dr Mayer Camel  . Total hip arthroplasty Left 05/12/2014    Procedure: TOTAL HIP ARTHROPLASTY;  Surgeon: Kerin Salen, MD;  Location: [facility];  Service: Orthopedics;  Laterality: Left;   Family History  Problem Relation Age of Onset  . COPD Brother   . Heart disease Brother   . Diabetes Brother   . Lymphoma Brother   . Alzheimer's disease Brother   . Heart disease Mother   . Pancreatic cancer Sister   . Alzheimer's disease Sister   .  Breast cancer Sister   . Leukemia Other    History  Substance Use Topics  . Smoking status: Former Smoker -- 1.00 packs/day for 45 years    Types: Cigarettes    Quit date: 06/16/1999  . Smokeless tobacco: Never Used  . Alcohol Use: No   OB History    No data available     Review of Systems  All other systems reviewed and are negative.     Allergies  Lovastatin; Statins; Sulfa antibiotics; Codeine; and Niacin  Home Medications   Prior to Admission medications   Medication Sig Start Date End Date Taking? Authorizing Provider  ALPRAZolam (XANAX) 0.25 MG tablet Take 1 tablet (0.25 mg total) by mouth 2 (two) times daily as needed for anxiety. 01/22/15  Yes Amy Cletis Athens, MD  aspirin EC 325 MG tablet Take 1 tablet (325 mg total) by mouth 2 (two) times daily. Patient taking differently: Take 325 mg by mouth daily.  05/12/14  Yes Leighton Parody, PA-C  Cholecalciferol 1000 UNITS capsule Take 1,000 Units by mouth daily.   Yes Historical Provider, MD  ciprofloxacin (CIPRO) 500 MG tablet Take 1 tablet (500 mg total) by mouth 2 (two) times daily. 02/01/15  Yes Charlynne Cousins, MD  escitalopram (LEXAPRO) 20 MG tablet Take 1 tablet (20 mg total) by mouth daily.  01/22/15  Yes Amy Cletis Athens, MD  fenofibrate 54 MG tablet TAKE TWO (2) TABLETS BY MOUTH DAILY Patient taking differently: TAKE TWO (2) TABLETS BY MOUTH DAILY  AT BEDTIME 12/09/14  Yes Amy E Bedsole, MD  furosemide (LASIX) 20 MG tablet Take 1 tablet (20 mg total) by mouth daily. 02/03/15   Wandra Arthurs, MD  metFORMIN (GLUCOPHAGE) 500 MG tablet Take 1 tablet (500 mg total) by mouth 2 (two) times daily with a meal. Patient not taking: Reported on 02/03/2015 02/09/15   Charlynne Cousins, MD   BP 156/77 mmHg  Pulse 87  Temp(Src) 98.1 F (36.7 C) (Oral)  Resp 16  Ht 5' 2.5" (1.588 m)  Wt 178 lb 11.2 oz (81.058 kg)  BMI 32.14 kg/m2  SpO2 96% Physical Exam  Constitutional: She is oriented to person, place, and time. She appears  well-developed and well-nourished.  HENT:  Head: Normocephalic and atraumatic.  Right Ear: External ear normal.  Left Ear: External ear normal.  Eyes: Conjunctivae and EOM are normal. Pupils are equal, round, and reactive to light.  Neck: Normal range of motion and phonation normal. Neck supple.  Cardiovascular: Normal rate, regular rhythm and normal heart sounds.   Pulmonary/Chest: Effort normal and breath sounds normal. No respiratory distress. She exhibits no bony tenderness.  Decreased air movement with bilateral Rales at the bases. No rhonchi or wheezing.  Abdominal: Soft. She exhibits no distension. There is no tenderness. There is no guarding.  Musculoskeletal: Normal range of motion. She exhibits edema (2+ bilateral lower extremity).  Neurological: She is alert and oriented to person, place, and time. No cranial nerve deficit or sensory deficit. She exhibits normal muscle tone. Coordination normal.  Skin: Skin is warm, dry and intact.  Psychiatric: She has a normal mood and affect. Her behavior is normal. Judgment and thought content normal.  Nursing note and vitals reviewed.   ED Course  Procedures (including critical care time)  Medications  ipratropium-albuterol (DUONEB) 0.5-2.5 (3) MG/3ML nebulizer solution 3 mL (3 mLs Nebulization Given 02/03/15 1546)  predniSONE (DELTASONE) tablet 60 mg (60 mg Oral Given 02/03/15 1546)  technetium albumin aggregated (MAA) injection solution 6 milli Curie (6 milli Curies Intravenous Contrast Given 02/03/15 1614)    Patient Vitals for the past 24 hrs:  BP Temp Temp src Pulse Resp SpO2 Height Weight  02/03/15 1754 156/77 mmHg - - 87 16 96 % - -  02/03/15 1702 156/77 mmHg - - 94 24 96 % - -  02/03/15 1701 156/77 mmHg - - 92 20 95 % - -  02/03/15 1600 165/79 mmHg - - 83 21 100 % - -  02/03/15 1546 182/78 mmHg - - 84 (!) 29 97 % - -  02/03/15 1500 182/78 mmHg - - 82 10 98 % - -  02/03/15 1427 194/91 mmHg - - 99 25 97 % - -  02/03/15 1415  179/68 mmHg - - 79 18 95 % - -  02/03/15 1400 175/76 mmHg - - - 18 - - -  02/03/15 1345 182/81 mmHg - - 82 22 94 % - -  02/03/15 1330 186/81 mmHg - - 78 16 97 % - -  02/03/15 1315 185/71 mmHg - - 78 21 96 % - -  02/03/15 1300 174/69 mmHg - - 76 14 96 % - -  02/03/15 1253 187/77 mmHg - - 83 18 96 % - -  02/03/15 1200 182/80 mmHg - - 79 12 99 % - -  02/03/15  1145 178/83 mmHg - - 80 22 96 % - -  02/03/15 1130 175/79 mmHg - - 80 18 97 % - -  02/03/15 1121 181/73 mmHg - - 84 17 97 % - -  02/03/15 1109 - - - - - - - 178 lb 11.2 oz (81.058 kg)  02/03/15 1058 165/77 mmHg 98.1 F (36.7 C) Oral 92 20 94 % 5' 2.5" (1.588 m) -    3:30 PM Reevaluation with update and discussion. After initial assessment and treatment, an updated evaluation reveals no change in clinical status. Lungs continue to have poor air movement at the bases bilaterally. VQ scan ordered to evaluate for PE, since d-dimer is elevated.Daleen Bo L        Labs Review Labs Reviewed  URINALYSIS, ROUTINE W REFLEX MICROSCOPIC (NOT AT [facility]) - Abnormal; Notable for the following:    Hgb urine dipstick TRACE (*)    Protein, ur 100 (*)    All other components within normal limits  CBC WITH DIFFERENTIAL/PLATELET - Abnormal; Notable for the following:    RBC 3.12 (*)    Hemoglobin 8.3 (*)    HCT 26.9 (*)    All other components within normal limits  BASIC METABOLIC PANEL - Abnormal; Notable for the following:    Glucose, Bld 130 (*)    Creatinine, Ser 1.97 (*)    Calcium 8.8 (*)    GFR calc non Af Amer 23 (*)    GFR calc Af Amer 26 (*)    All other components within normal limits  TROPONIN I - Abnormal; Notable for the following:    Troponin I 0.06 (*)    All other components within normal limits  BRAIN NATRIURETIC PEPTIDE - Abnormal; Notable for the following:    B Natriuretic Peptide 575.9 (*)    All other components within normal limits  D-DIMER, QUANTITATIVE (NOT AT [facility]) - Abnormal; Notable for the following:     D-Dimer, Quant 5.80 (*)    All other components within normal limits  URINE MICROSCOPIC-ADD ON - Abnormal; Notable for the following:    Squamous Epithelial / LPF FEW (*)    Casts HYALINE CASTS (*)    All other components within normal limits  I-STAT TROPOININ, ED    Imaging Review Dg Chest 2 View  02/03/2015   CLINICAL DATA:  Shortness of breath, urinary tract infection for 2 days  EXAM: CHEST  2 VIEW  COMPARISON:  01/27/2015  FINDINGS: Cardiomediastinal silhouette is stable. No acute infiltrate or pleural effusion. No pulmonary edema. Mild thoracic spine osteopenia.  IMPRESSION: No active cardiopulmonary disease.   Electronically Signed   By: Lahoma Crocker M.D.   On: 02/03/2015 12:20   Nm Pulmonary Perf And Vent  02/03/2015   CLINICAL DATA:  Shortness of breath.  EXAM: NUCLEAR MEDICINE VENTILATION - PERFUSION LUNG SCAN  TECHNIQUE: Ventilation images were obtained in multiple projections using inhaled aerosol Tc-36m DTPA. Perfusion images were obtained in multiple projections after intravenous injection of Tc-55m MAA.  RADIOPHARMACEUTICALS:  40 mCi Technetium-53m DTPA aerosol inhalation and 6 mCi Technetium-54m MAA IV  COMPARISON:  05/16/2014  FINDINGS: There are no unmatched segment perfusion defects or lower lobe triple matched defects. Small patchy matched defects present in the right lower lobe. The largest apparent defect in the right lower lobe (superior segment) on oblique imaging is likely the hilum, more centrally seen on the previous study due to differences in positioning.  Heterogeneous ventilation compatible with COPD.  IMPRESSION: Low probability for pulmonary  embolism by PIOPED II.   Electronically Signed   By: Monte Fantasia M.D.   On: 02/03/2015 17:09     EKG Interpretation   Date/Time:  Tuesday February 03 2015 10:48:16 EDT Ventricular Rate:  93 PR Interval:  116 QRS Duration: 68 QT Interval:  332 QTC Calculation: 412 R Axis:   66 Text Interpretation:  Normal sinus rhythm  Nonspecific T wave abnormality  Abnormal ECG since last tracing no significant change Confirmed by Salvador Coupe   MD, Mike Berntsen [id number]) on 02/03/2015 11:02:23 AM      MDM   Final diagnoses:  SOB (shortness of breath)  Pedal edema    Nonspecific shortness of breath with peripheral edema. Mildly elevated BNP, without baseline lab for comparison. Markedly elevated d-dimer in the absence of other obvious reason, for SOB, will require screening for PE.  Nursing Notes Reviewed/ Care Coordinated, and agree without changes. Applicable Imaging Reviewed.  Interpretation of Laboratory Data incorporated into ED treatment  Plan- D/C home with treatment for Bronchitis if VQ scan is negative.   Daleen Bo, MD 02/04/15 [phone]

## 2015-02-03 NOTE — ED Provider Notes (Signed)
Physical Exam  BP 156/77 mmHg  Pulse 87  Temp(Src) 98.1 F (36.7 C) (Oral)  Resp 16  Ht 5' 2.5" (1.588 m)  Wt 178 lb 11.2 oz (81.058 kg)  BMI 32.14 kg/m2  SpO2 96%  Physical Exam  ED Course  Procedures  MDM Care assumed at sign out. Patient recently admitted for UTI/renal abscess. Dc 2 days ago. Today, has worsening shortness of breath. Dr. Eulis Foster saw patient and obtained D-dimer that was elevated. Since Cr 1.9 (baseline), unable to get CT. Sign out pending VQ scan. VQ scan showed low probability for PE. Patient was never hypoxic. Able to ambulate with O2 90-91%. Lungs clear. Some pedal edema. Received IVF in the hospital. BNP 500 no baseline. Clinically, slightly volume overloaded. Had echo last year that showed nl EF and has no hx of CHF. Will give lasix 20 mg daily x 3 doses. Also initial troponin mildly positive but has no chest pain. Repeat troponin about 6 hr later was neg. Stable for dc.   Results for orders placed or performed during the hospital encounter of 02/03/15  Urinalysis, Routine w reflex microscopic (not at [facility])  Result Value Ref Range   Color, Urine YELLOW YELLOW   APPearance CLEAR CLEAR   Specific Gravity, Urine 1.012 1.005 - 1.030   pH 7.0 5.0 - 8.0   Glucose, UA NEGATIVE NEGATIVE mg/dL   Hgb urine dipstick TRACE (A) NEGATIVE   Bilirubin Urine NEGATIVE NEGATIVE   Ketones, ur NEGATIVE NEGATIVE mg/dL   Protein, ur 100 (A) NEGATIVE mg/dL   Urobilinogen, UA 0.2 0.0 - 1.0 mg/dL   Nitrite NEGATIVE NEGATIVE   Leukocytes, UA NEGATIVE NEGATIVE  CBC with Differential  Result Value Ref Range   WBC 7.4 4.0 - 10.5 K/uL   RBC 3.12 (L) 3.87 - 5.11 MIL/uL   Hemoglobin 8.3 (L) 12.0 - 15.0 g/dL   HCT 26.9 (L) 36.0 - 46.0 %   MCV 86.2 78.0 - 100.0 fL   MCH 26.6 26.0 - 34.0 pg   MCHC 30.9 30.0 - 36.0 g/dL   RDW 14.9 11.5 - 15.5 %   Platelets 356 150 - 400 K/uL   Neutrophils Relative % 64 43 - 77 %   Neutro Abs 4.7 1.7 - 7.7 K/uL   Lymphocytes Relative 24 12 - 46 %    Lymphs Abs 1.8 0.7 - 4.0 K/uL   Monocytes Relative 9 3 - 12 %   Monocytes Absolute 0.7 0.1 - 1.0 K/uL   Eosinophils Relative 2 0 - 5 %   Eosinophils Absolute 0.2 0.0 - 0.7 K/uL   Basophils Relative 1 0 - 1 %   Basophils Absolute 0.0 0.0 - 0.1 K/uL  Basic metabolic panel  Result Value Ref Range   Sodium 141 135 - 145 mmol/L   Potassium 4.3 3.5 - 5.1 mmol/L   Chloride 105 101 - 111 mmol/L   CO2 28 22 - 32 mmol/L   Glucose, Bld 130 (H) 65 - 99 mg/dL   BUN 18 6 - 20 mg/dL   Creatinine, Ser 1.97 (H) 0.44 - 1.00 mg/dL   Calcium 8.8 (L) 8.9 - 10.3 mg/dL   GFR calc non Af Amer 23 (L) >60 mL/min   GFR calc Af Amer 26 (L) >60 mL/min   Anion gap 8 5 - 15  Troponin I  Result Value Ref Range   Troponin I 0.06 (H) <0.031 ng/mL  Brain natriuretic peptide  Result Value Ref Range   B Natriuretic Peptide 575.9 (H) 0.0 -  100.0 pg/mL  D-dimer, quantitative (not at [facility])  Result Value Ref Range   D-Dimer, Quant 5.80 (H) 0.00 - 0.48 ug/mL-FEU  Urine microscopic-add on  Result Value Ref Range   Squamous Epithelial / LPF FEW (A) RARE   WBC, UA 0-2 <3 WBC/hpf   RBC / HPF 3-6 <3 RBC/hpf   Casts HYALINE CASTS (A) NEGATIVE   Urine-Other MUCOUS PRESENT   I-stat troponin, ED  Result Value Ref Range   Troponin i, poc 0.03 0.00 - 0.08 ng/mL   Comment 3           Dg Chest 2 View  02/03/2015   CLINICAL DATA:  Shortness of breath, urinary tract infection for 2 days  EXAM: CHEST  2 VIEW  COMPARISON:  01/27/2015  FINDINGS: Cardiomediastinal silhouette is stable. No acute infiltrate or pleural effusion. No pulmonary edema. Mild thoracic spine osteopenia.  IMPRESSION: No active cardiopulmonary disease.   Electronically Signed   By: Lahoma Crocker M.D.   On: 02/03/2015 12:20   US Renal  01/31/2015   CLINICAL DATA:  Renal abscess.  EXAM: RENAL / URINARY TRACT ULTRASOUND COMPLETE  COMPARISON:  CT abdomen and pelvis 01/27/2015  FINDINGS: Right Kidney:  Length: 11.2 cm. No hydronephrosis. A subcapsular collection is  again seen, resulting in distortion of the renal parenchyma. The central portion of the collection appears to represent hypo to anechoic fluid measuring 4.8 x 0.9 x 4.2 cm. Surrounding this central hypoechoic fluid is thick hyperechoic material, with the short axis of the collection measuring at least 2.3 cm at the level of the upper pole to interpolar region, similar to that measured on the prior CT, and likely measuring closer to 3.5 cm at the level of the lower pole. The overall size and configuration is similar to that on the prior CT allowing for differences in modality.  Left Kidney:  Length: 10.2 cm. Echogenic parenchyma a suggestive of medical renal disease. No hydronephrosis. 1.6 cm hypoechoic lesion in the interpolar kidney corresponds to a low-density lesion on CT, most likely a cyst.  Bladder:  Appears normal for degree of bladder distention.  A right pleural effusion is partially visualized.  IMPRESSION: 1. Subcapsular right renal collection, overall grossly similar in size to recent CT. 2. Increased echogenicity of the left kidney suggesting medical renal disease. 3. Right pleural effusion.   Electronically Signed   By: Logan Bores   On: 01/31/2015 07:40   Nm Pulmonary Perf And Vent  02/03/2015   CLINICAL DATA:  Shortness of breath.  EXAM: NUCLEAR MEDICINE VENTILATION - PERFUSION LUNG SCAN  TECHNIQUE: Ventilation images were obtained in multiple projections using inhaled aerosol Tc-47m DTPA. Perfusion images were obtained in multiple projections after intravenous injection of Tc-82m MAA.  RADIOPHARMACEUTICALS:  40 mCi Technetium-39m DTPA aerosol inhalation and 6 mCi Technetium-24m MAA IV  COMPARISON:  05/16/2014  FINDINGS: There are no unmatched segment perfusion defects or lower lobe triple matched defects. Small patchy matched defects present in the right lower lobe. The largest apparent defect in the right lower lobe (superior segment) on oblique imaging is likely the hilum, more centrally seen  on the previous study due to differences in positioning.  Heterogeneous ventilation compatible with COPD.  IMPRESSION: Low probability for pulmonary embolism by PIOPED II.   Electronically Signed   By: Monte Fantasia M.D.   On: 02/03/2015 17:09   Dg Abd Acute W/chest  01/27/2015   CLINICAL DATA:  Emesis today.  Fever of 104.  EXAM: DG ABDOMEN ACUTE  W/ 1V CHEST  COMPARISON:  None.  FINDINGS: There is no evidence of dilated bowel loops or free intraperitoneal air. Mildly prominent mid abdominal small bowel loops are nondistended. No radiopaque calculi or other significant radiographic abnormality is seen. Heart size and mediastinal contours are within normal limits. Both lungs are clear. Calcified tortuous aorta. Moderate stool burden. BILATERAL hip replacements. Lumbar spine degenerative disc disease.  IMPRESSION: Mildly prominent mid abdominal small bowel loops without obstruction. No visible free air.  No active cardiopulmonary disease.   Electronically Signed   By: Rolla Flatten M.D.   On: 01/27/2015 01:15   Ct Renal Stone Study  01/27/2015   CLINICAL DATA:  Sudden onset of right flank pain.  EXAM: CT ABDOMEN AND PELVIS WITHOUT CONTRAST  TECHNIQUE: Multidetector CT imaging of the abdomen and pelvis was performed following the standard protocol without IV contrast.  COMPARISON:  None.  FINDINGS: There is a 3 mm subpleural nodule in the right lower lobe. Minimal subpleural fibrosis in the right middle lobe and lingula.  There is moderate perinephric stranding about the right kidney. Subcapsular renal collection measuring 2.3 cm causing compression of the renal parenchyma. The right renal collecting system is poorly defined. There is a 1.3 cm hypodensity in the lower right kidney. No definite stone. The right ureter were visible is decompressed without stone. There is thinning of the left renal parenchyma without hydronephrosis or stone evaluation of the distal ureters is obscured by streak artifact from hip  prostheses.  Evaluation of the remaining solid and hollow viscera is limited given lack of contrast. The unenhanced liver, gallbladder, spleen, and left adrenal gland are normal. Pancreas is atrophic without ductal dilatation. Right adrenal gland is obscured.  Small hiatal hernia. Stomach is decompressed. There are no dilated or thickened bowel loops. Diverticulosis of the distal colon without diverticulitis. The appendix is normal. No colonic wall thickening.  No retroperitoneal adenopathy. Abdominal aorta is normal in caliber. Moderate to dense atherosclerosis of the abdominal aorta without aneurysm.  Pelvic not any is obscured by streak artifact and hip prosthesis. The uterus appears surgically absent.  Diffuse degenerative change throughout the spine with degenerative disc disease and facet arthropathy.  IMPRESSION: 1. Heterogeneous of capsule right renal collection measuring up to 2.3 cm causing compression of the renal parenchyma and distorting normal corticomedullary anatomy. There is moderate adjacent perinephric stranding. Findings may reflect a subcapsular hematoma, however additional subcapsular collections such as ruptured cyst or infected fluid collection are not excluded. Contrast-enhanced exam or MRI recommended if patient is able tolerate. There is no urolithiasis. 2. Incidental findings of the small hiatal hernia. Diverticulosis without diverticulitis.  These results were called by telephone at the time of interpretation on 01/27/2015 at 6:51 am to Dr. Berle Mull , who verbally acknowledged these results.   Electronically Signed   By: Jeb Levering M.D.   On: 01/27/2015 06:53      Wandra Arthurs, MD 02/03/15 [phone]

## 2015-02-03 NOTE — Telephone Encounter (Signed)
[facility] Patient Name: Jill [name] DOB: Oct 06, 1931 Initial Comment caller states her mother just came home from the hospital - had a infection - today is having a problem breathing Nurse Assessment Nurse: Markus Daft, RN, Sherre Poot Date/Time (Eastern Time): 02/03/2015 9:36:53 AM Confirm and document reason for call. If symptomatic, describe symptoms. ---Caller states her mother just came home from the hospital Sunday afternoon d/t E.Coli infection in her kidneys and an abscess in her kidney also. They believe it started out as a UTI. Currently taking Cipro BID. C/O dyspnea on exertion and weakness requiring help to ambulate. A little worse today than yesterday. -- Labs during hospitalization: Hgb 8.0 at hospital. U/S does not show improvement like they were told there was. Has the patient traveled out of the country within the last 30 days? ---Not Applicable Does the patient require triage? ---Yes Related visit to physician within the last 2 weeks? ---Yes Does the PT have any chronic conditions? (i.e. diabetes, asthma, etc.) ---Yes List chronic conditions. ---NIDDM - off the Metformin currently, Stage 3 renal failure, HTN Guidelines Guideline Title Affirmed Question Affirmed Notes Urinary Tract Infection on Antibiotic Follow-up Call - Female Patient sounds very sick or weak to the triager -->short of breath on exertion, and takes 10 - 15 minutes to resolve at rest; a little improved now, but still some. Weakness is all over. No CP/Pressure. Final Disposition User Go to ED Now (or PCP triage) Markus Daft, RN, Sherre Poot -Returning to [facility].

## 2015-02-03 NOTE — Telephone Encounter (Signed)
Noted  

## 2015-02-03 NOTE — Telephone Encounter (Signed)
Called and spoke to [place] (patient's daughter) and was advised that they are at [facility] ER now. Was advised by Estill Bamberg that her mom is having difficulty catching her breathe, extremely weak and legs are swollen.

## 2015-02-05 ENCOUNTER — Ambulatory Visit (INDEPENDENT_AMBULATORY_CARE_PROVIDER_SITE_OTHER): Payer: Medicare PPO | Admitting: Family Medicine

## 2015-02-05 ENCOUNTER — Encounter: Payer: Self-pay | Admitting: Family Medicine

## 2015-02-05 VITALS — BP 128/70 | HR 94 | Temp 98.4°F | Ht 62.5 in | Wt 177.5 lb

## 2015-02-05 DIAGNOSIS — R0609 Other forms of dyspnea: Secondary | ICD-10-CM

## 2015-02-05 DIAGNOSIS — N183 Chronic kidney disease, stage 3 unspecified: Secondary | ICD-10-CM

## 2015-02-05 DIAGNOSIS — N179 Acute kidney failure, unspecified: Secondary | ICD-10-CM

## 2015-02-05 DIAGNOSIS — R799 Abnormal finding of blood chemistry, unspecified: Secondary | ICD-10-CM

## 2015-02-05 DIAGNOSIS — N189 Chronic kidney disease, unspecified: Principal | ICD-10-CM

## 2015-02-05 DIAGNOSIS — R609 Edema, unspecified: Secondary | ICD-10-CM | POA: Diagnosis not present

## 2015-02-05 DIAGNOSIS — N10 Acute tubulo-interstitial nephritis: Secondary | ICD-10-CM

## 2015-02-05 DIAGNOSIS — D649 Anemia, unspecified: Secondary | ICD-10-CM | POA: Diagnosis not present

## 2015-02-05 DIAGNOSIS — R7989 Other specified abnormal findings of blood chemistry: Secondary | ICD-10-CM | POA: Insufficient documentation

## 2015-02-05 DIAGNOSIS — N289 Disorder of kidney and ureter, unspecified: Secondary | ICD-10-CM | POA: Insufficient documentation

## 2015-02-05 NOTE — Assessment & Plan Note (Addendum)
Improving at hospital discharge and improved at ER visit on 6/14. Will check BMET today to re-eval as she has now been on lasix 20 mg x 2 days.

## 2015-02-05 NOTE — Assessment & Plan Note (Addendum)
Felt  by hospital MD to be a renal abscess but this is not definitively stated with reading of CT/ [place].  No clear improvement/ no change in size with antibiotics in hospital or outpt. Pt remains feeling poorly, but no fever on antibiotics. Continue 10 day course of cipro.  Will refer to nephrology urgently for further recommendations on further work up, ? Renal MRI, to characterize lesion better. Also if felt to be abscess may need to consider drainage even though < 5 cm if continuing to not decrease in size as expected with antibiotics   Close follow up.

## 2015-02-05 NOTE — Assessment & Plan Note (Signed)
On cipro 10 day course.

## 2015-02-05 NOTE — Progress Notes (Signed)
Subjective:    Patient ID: Jill [name], female    DOB: Sep 14, 1931, 81 y.o.   MRN: [id number]  HPI   81 year old previously healthy female with history of  CKD stage 3 ( baseline Cr 1.2 to 1.6), diabetes,  HTN  presents for 2 recent hospital ad ER visits.   She was admitted to  [facility]  from 01/26/2015 to 02/01/2015 for acute pyelonephritis and new renal lesion, possible abcess. She was diagnosed after having symptoms of N/V/ fever and fatigue.  Hospital Course:  Pyelonephritis/ renal abscess: - started on empiric rocephin on 6.7.2016 transition to [place] on 6.8.2016. - U.C. Grew e.coli pan sensitive.  - transitioned  to oral cipro 500 mg twice daily, plans 10 days.  Acute-on-chronic kidney injury, initial Cr on admission 2.75 - Cr  Improved at discharge. - Due to infectious etiology -Needs  B-met 1 week after discharge   Diabetes mellitus without complication - d/c metformin start SSI. - resume diet. - restart metformin once cr return to baseline.  Procedures: 01/27/2015: CT renal    Findings may reflect a subcapsular hematoma, however additional subcapsular collections such as ruptured cyst or infected fluid collection are not excluded. Contrast-enhanced exam or MRI recommended if patient is able tolerate.  01/31/2015: US renal   Subcapsular right renal collection, overall grossly similar in size to recent CT.  THEN: 3 days following discharge she began having progressive  SOB, nonproductive cough. She had also noted swelling in her legs bilaterally.  She returned to the ER on  02/03/2015  CXR: clear on 6/14 D dimer was elevated. VQ Scan was neg for PE. Cr on recheck was 1.97, improved. Glucose 130 off metformin.  BNP 575 , but this was not felt to be elevated. Started on lasix 20 mg daily.   Hemoglobin on first admission  10.8, now stable at 8.3 Trace of blood in urine. No other bleeding.  Initially hemoglobin may be due to falsely elevated HG with dehydraion.  Today she  remains weak, but some improvement in swelling, but no  large UOP with the lasix  20 mg daily. She has lost 3 lbs since lasix.  Wt Readings from Last 3 Encounters:  02/05/15 177 lb 8 oz (80.513 kg)  02/03/15 178 lb 11.2 oz (81.058 kg)  02/01/15 180 lb 14.4 oz (82.056 kg)  Not eating much so, blood sugar remaining well controlled off metformin.  Social History /Family History/Past Medical History reviewed and updated if needed.   Review of Systems  Constitutional: Positive for fatigue. Negative for fever.  HENT: Positive for congestion.   Eyes: Negative for pain.  Respiratory: Positive for cough and shortness of breath. Negative for wheezing.   Cardiovascular: Positive for leg swelling. Negative for chest pain and palpitations.  Gastrointestinal: Negative for abdominal pain.  Genitourinary: Positive for hematuria. Negative for dysuria and urgency.  Musculoskeletal: Negative for back pain.  Neurological: Positive for weakness.  Psychiatric/Behavioral: Negative for behavioral problems.       Objective:   Physical Exam  Constitutional: She is oriented to person, place, and time. Vital signs are normal. She appears well-developed and well-nourished. She is cooperative.  Non-toxic appearance. She does not appear ill. No distress.  Weak appearing female in NAD  HENT:  Head: Normocephalic.  Right Ear: Hearing, tympanic membrane, external ear and ear canal normal. Tympanic membrane is not erythematous, not retracted and not bulging.  Left Ear: Hearing, tympanic membrane, external ear and ear canal normal. Tympanic membrane is  not erythematous, not retracted and not bulging.  Nose: No mucosal edema or rhinorrhea. Right sinus exhibits no maxillary sinus tenderness and no frontal sinus tenderness. Left sinus exhibits no maxillary sinus tenderness and no frontal sinus tenderness.  Mouth/Throat: Uvula is midline, oropharynx is clear and moist and mucous membranes are normal.  Eyes: Conjunctivae,  EOM and lids are normal. Pupils are equal, round, and reactive to light. Lids are everted and swept, no foreign bodies found.  Neck: Trachea normal and normal range of motion. Neck supple. Carotid bruit is not present. No thyroid mass and no thyromegaly present.  Cardiovascular: Regular rhythm, S1 normal, S2 normal, normal heart sounds, intact distal pulses and normal pulses.  Tachycardia present.  Exam reveals no gallop and no friction rub.   No murmur heard. mild increase in HR   < 1 plus peripheral edema  Pulmonary/Chest: Effort normal and breath sounds normal. No tachypnea. No respiratory distress. She has no decreased breath sounds. She has no wheezes. She has no rhonchi. She has no rales.  Abdominal: Soft. Normal appearance and bowel sounds are normal. There is no tenderness.  Neurological: She is alert and oriented to person, place, and time.  Skin: Skin is warm, dry and intact. No rash noted.  Psychiatric: Her speech is normal and behavior is normal. Judgment and thought content normal. Her mood appears not anxious. Cognition and memory are normal. She does not exhibit a depressed mood.          Assessment & Plan:  Total visit time 40 minutes, > 50% spent  Reviewing hospital chart, labs, imaging, answering questions of family members,counseling and cordinating patients care.

## 2015-02-05 NOTE — Patient Instructions (Signed)
Stop at lab on way out.  Stop at front desk for referral to nephrologist and for ECHO.  Schedule follow up 30 min OV with Dr. Jacinto Reap next Tuesday.  Call if fever returns or cannot keep down antibiotics.

## 2015-02-05 NOTE — Assessment & Plan Note (Signed)
Unclear if due to recent volume overload from acute injury to kidneys.  BNP was elevated in ER.  Previous ECHO in 2015 showed no CHF. Nml lung exam today.   Will re-eval BNP today for rise and will eval heart with repeat ECHO.

## 2015-02-05 NOTE — Assessment & Plan Note (Signed)
Unclear origin. Initial Hg likely high due to dehydration. Small amount of blood in urine with infection per pt but minimal.  no other bleeding.  ? If fluid accumulation/lesion in right kidney is blood. Pt does have chronic renal disease that is at least in part contributing to anemia.  Will eval further with repeat cbc, iron panel and erythropoetin.

## 2015-02-05 NOTE — Progress Notes (Signed)
Pre visit review using our clinic review tool, if applicable. No additional management support is needed unless otherwise documented below in the visit note. 

## 2015-02-06 ENCOUNTER — Other Ambulatory Visit: Payer: Self-pay | Admitting: *Deleted

## 2015-02-06 LAB — CBC WITH DIFFERENTIAL/PLATELET
Basophils Absolute: 0.3 10*3/uL — ABNORMAL HIGH (ref 0.0–0.1)
Basophils Relative: 1.9 % (ref 0.0–3.0)
EOS ABS: 0.3 10*3/uL (ref 0.0–0.7)
Eosinophils Relative: 2.4 % (ref 0.0–5.0)
HEMATOCRIT: 28.4 % — AB (ref 36.0–46.0)
HEMOGLOBIN: 9.1 g/dL — AB (ref 12.0–15.0)
LYMPHS ABS: 4.6 10*3/uL — AB (ref 0.7–4.0)
Lymphocytes Relative: 34.3 % (ref 12.0–46.0)
MCHC: 31.9 g/dL (ref 30.0–36.0)
MCV: 85.4 fl (ref 78.0–100.0)
Monocytes Absolute: 0.9 10*3/uL (ref 0.1–1.0)
Monocytes Relative: 6.7 % (ref 3.0–12.0)
NEUTROS ABS: 7.4 10*3/uL (ref 1.4–7.7)
Neutrophils Relative %: 54.7 % (ref 43.0–77.0)
Platelets: 423 10*3/uL — ABNORMAL HIGH (ref 150.0–400.0)
RBC: 3.32 Mil/uL — ABNORMAL LOW (ref 3.87–5.11)
RDW: 15.4 % (ref 11.5–15.5)
WBC: 13.5 10*3/uL — ABNORMAL HIGH (ref 4.0–10.5)

## 2015-02-06 LAB — IBC PANEL
Iron: 54 ug/dL (ref 42–145)
Saturation Ratios: 13.2 % — ABNORMAL LOW (ref 20.0–50.0)
TRANSFERRIN: 292 mg/dL (ref 212.0–360.0)

## 2015-02-06 LAB — BASIC METABOLIC PANEL
BUN: 30 mg/dL — AB (ref 6–23)
CHLORIDE: 101 meq/L (ref 96–112)
CO2: 29 meq/L (ref 19–32)
CREATININE: 2.13 mg/dL — AB (ref 0.40–1.20)
Calcium: 9.4 mg/dL (ref 8.4–10.5)
GFR: 23.63 mL/min — ABNORMAL LOW (ref >60.00)
GLUCOSE: 89 mg/dL (ref 70–99)
POTASSIUM: 5 meq/L (ref 3.5–5.1)
Sodium: 138 mEq/L (ref 135–145)

## 2015-02-06 LAB — BRAIN NATRIURETIC PEPTIDE: PRO B NATRI PEPTIDE: 599 pg/mL — AB (ref 0.0–100.0)

## 2015-02-06 LAB — FERRITIN: Ferritin: 77.4 ng/mL (ref 10.0–291.0)

## 2015-02-06 MED ORDER — FUROSEMIDE 20 MG PO TABS: 20.0000 mg | ORAL_TABLET | Freq: Every day | ORAL | Status: DC

## 2015-02-07 ENCOUNTER — Other Ambulatory Visit: Payer: Self-pay | Admitting: Family Medicine

## 2015-02-09 DIAGNOSIS — N184 Chronic kidney disease, stage 4 (severe): Secondary | ICD-10-CM | POA: Diagnosis not present

## 2015-02-09 DIAGNOSIS — N390 Urinary tract infection, site not specified: Secondary | ICD-10-CM | POA: Diagnosis not present

## 2015-02-09 DIAGNOSIS — N179 Acute kidney failure, unspecified: Secondary | ICD-10-CM | POA: Diagnosis not present

## 2015-02-09 LAB — ERYTHROPOIETIN: Erythropoietin: 37.6 m[IU]/mL — ABNORMAL HIGH (ref 2.6–18.5)

## 2015-02-09 NOTE — Telephone Encounter (Signed)
Received refill request electronically from pharmacy See last office note and lab results. Last refill on Lasix #3, is it okay to refill?

## 2015-02-10 ENCOUNTER — Ambulatory Visit: Payer: Medicare PPO | Admitting: Family Medicine

## 2015-02-13 ENCOUNTER — Other Ambulatory Visit: Payer: Self-pay | Admitting: Nephrology

## 2015-02-13 DIAGNOSIS — N179 Acute kidney failure, unspecified: Secondary | ICD-10-CM | POA: Diagnosis not present

## 2015-02-13 DIAGNOSIS — N390 Urinary tract infection, site not specified: Secondary | ICD-10-CM | POA: Diagnosis not present

## 2015-02-13 DIAGNOSIS — R0609 Other forms of dyspnea: Secondary | ICD-10-CM | POA: Diagnosis not present

## 2015-02-13 DIAGNOSIS — N184 Chronic kidney disease, stage 4 (severe): Secondary | ICD-10-CM | POA: Diagnosis not present

## 2015-02-13 DIAGNOSIS — N151 Renal and perinephric abscess: Secondary | ICD-10-CM

## 2015-02-16 ENCOUNTER — Emergency Department
Admission: EM | Admit: 2015-02-16 | Discharge: 2015-02-16 | Disposition: A | Discharge status: Home / Self Care | Payer: Medicare PPO | Attending: Emergency Medicine | Admitting: Emergency Medicine

## 2015-02-16 ENCOUNTER — Encounter: Payer: Self-pay | Admitting: General Practice

## 2015-02-16 ENCOUNTER — Other Ambulatory Visit: Payer: Self-pay

## 2015-02-16 ENCOUNTER — Ambulatory Visit
Admission: RE | Admit: 2015-02-16 | Discharge: 2015-02-16 | Disposition: A | Discharge status: Home / Self Care | Payer: Medicare PPO | Source: Ambulatory Visit | Attending: Nephrology | Admitting: Nephrology

## 2015-02-16 ENCOUNTER — Ambulatory Visit (INDEPENDENT_AMBULATORY_CARE_PROVIDER_SITE_OTHER): Payer: Medicare PPO

## 2015-02-16 DIAGNOSIS — R0609 Other forms of dyspnea: Secondary | ICD-10-CM | POA: Diagnosis not present

## 2015-02-16 DIAGNOSIS — I129 Hypertensive chronic kidney disease with stage 1 through stage 4 chronic kidney disease, or unspecified chronic kidney disease: Secondary | ICD-10-CM | POA: Insufficient documentation

## 2015-02-16 DIAGNOSIS — R339 Retention of urine, unspecified: Secondary | ICD-10-CM | POA: Diagnosis not present

## 2015-02-16 DIAGNOSIS — Z87891 Personal history of nicotine dependence: Secondary | ICD-10-CM | POA: Insufficient documentation

## 2015-02-16 DIAGNOSIS — G8929 Other chronic pain: Secondary | ICD-10-CM | POA: Insufficient documentation

## 2015-02-16 DIAGNOSIS — N151 Renal and perinephric abscess: Secondary | ICD-10-CM | POA: Insufficient documentation

## 2015-02-16 DIAGNOSIS — E119 Type 2 diabetes mellitus without complications: Secondary | ICD-10-CM | POA: Diagnosis not present

## 2015-02-16 DIAGNOSIS — M549 Dorsalgia, unspecified: Secondary | ICD-10-CM | POA: Diagnosis not present

## 2015-02-16 DIAGNOSIS — R799 Abnormal finding of blood chemistry, unspecified: Secondary | ICD-10-CM | POA: Diagnosis not present

## 2015-02-16 DIAGNOSIS — N183 Chronic kidney disease, stage 3 (moderate): Secondary | ICD-10-CM | POA: Diagnosis not present

## 2015-02-16 DIAGNOSIS — R7989 Other specified abnormal findings of blood chemistry: Secondary | ICD-10-CM

## 2015-02-16 LAB — CBC
HCT: 29.1 % — ABNORMAL LOW (ref 35.0–47.0)
Hemoglobin: 9.4 g/dL — ABNORMAL LOW (ref 12.0–16.0)
MCH: 27.3 pg (ref 26.0–34.0)
MCHC: 32.2 g/dL (ref 32.0–36.0)
MCV: 84.9 fL (ref 80.0–100.0)
Platelets: 528 10*3/uL — ABNORMAL HIGH (ref 150–440)
RBC: 3.43 MIL/uL — ABNORMAL LOW (ref 3.80–5.20)
RDW: 15.3 % — ABNORMAL HIGH (ref 11.5–14.5)
WBC: 10.5 10*3/uL (ref 3.6–11.0)

## 2015-02-16 LAB — COMPREHENSIVE METABOLIC PANEL
ALK PHOS: 53 U/L (ref 38–126)
ALT: 8 U/L — ABNORMAL LOW (ref 14–54)
ANION GAP: 9 (ref 5–15)
AST: 18 U/L (ref 15–41)
Albumin: 3.2 g/dL — ABNORMAL LOW (ref 3.5–5.0)
BILIRUBIN TOTAL: 0.4 mg/dL (ref 0.3–1.2)
BUN: 34 mg/dL — AB (ref 6–20)
CHLORIDE: 102 mmol/L (ref 101–111)
CO2: 24 mmol/L (ref 22–32)
Calcium: 8.5 mg/dL — ABNORMAL LOW (ref 8.9–10.3)
Creatinine, Ser: 2.33 mg/dL — ABNORMAL HIGH (ref 0.44–1.00)
GFR calc non Af Amer: 18 mL/min — ABNORMAL LOW (ref >60)
GFR, EST AFRICAN AMERICAN: 21 mL/min — AB (ref >60)
GLUCOSE: 185 mg/dL — AB (ref 65–99)
Potassium: 4.1 mmol/L (ref 3.5–5.1)
Sodium: 135 mmol/L (ref 135–145)
TOTAL PROTEIN: 6.4 g/dL — AB (ref 6.5–8.1)

## 2015-02-16 LAB — URINALYSIS COMPLETE WITH MICROSCOPIC (ARMC ONLY)
BACTERIA UA: NONE SEEN
Bilirubin Urine: NEGATIVE
GLUCOSE, UA: NEGATIVE mg/dL
HGB URINE DIPSTICK: NEGATIVE
KETONES UR: NEGATIVE mg/dL
Leukocytes, UA: NEGATIVE
Nitrite: NEGATIVE
Protein, ur: NEGATIVE mg/dL
SPECIFIC GRAVITY, URINE: 1.01 (ref 1.005–1.030)
Squamous Epithelial / LPF: NONE SEEN
pH: 5 (ref 5.0–8.0)

## 2015-02-16 NOTE — ED Notes (Signed)
241 mL noted during bladder scan.

## 2015-02-16 NOTE — ED Provider Notes (Signed)
[facility] Emergency Department Provider Note     Time seen: ----------------------------------------- 5:36 PM on 02/16/2015 -----------------------------------------    I have reviewed the triage vital signs and the nursing notes.   HISTORY  Chief Complaint Urinary Retention    HPI Jill [name] is a 81 y.o. female who presents to ER with reports of urinary retention since around lunchtime. Patient states she was out to eat and has not urinated since that period of time. Daughter in law reports patient said increased tearing and her decreased urine output was sent by*Dr. Candiss Norse for possible Foley catheter. Patient recently diagnosed the hospital with pyelonephritis or perinephric abscess. She did have renal percent performed today.She denies fevers chills chest pain or shortness of breath. Patient states she has chronic back pain but nothing new. She states she can't urinate   Past Medical History  Diagnosis Date  . Diabetes mellitus   . Hyperlipidemia   . Chronic kidney disease   . Depression   . Allergy   . Hypertension   . Vasovagal syncope 2006    Negative cardiac workup-myoview, echo  . Diverticula, colon   . Family history of anesthesia complication     Son is difficult intubation  . Complication of anesthesia     difficult waking   . Arthritis     osteoarthritis     Patient Active Problem List   Diagnosis Date Noted  . Peripheral edema 02/05/2015  . DOE (dyspnea on exertion) 02/05/2015  . Elevated brain natriuretic peptide (BNP) level 02/05/2015  . Renal lesion 02/05/2015  . Anemia 02/05/2015  . Acute pyelonephritis 02/05/2015  . Renal abscess 01/28/2015  . Acute-on-chronic kidney injury 01/27/2015  . Fatigue due to depression 01/22/2015  . Chronic idiopathic urticaria 01/17/2015  . S/P hip replacement 05/16/2014  . Hyperkalemia 05/16/2014  . Arthritis, hip 05/12/2014  . Arthritis of left hip 05/11/2014  . DNR (do not  resuscitate) 02/20/2014  . ALLERGIC RHINITIS 12/17/2008  . SYNCOPE 10/27/2008  . CHEST PAIN 10/27/2008  . VITAMIN D DEFICIENCY 07/01/2008  . CHRONIC KIDNEY DISEASE STAGE III (MODERATE) 02/11/2008  . DIVERTICULOSIS OF COLON 02/01/2008  . COLONIC POLYPS, ADENOMATOUS 10/19/2006  . Diabetes mellitus without complication [id number]  . HYPERLIPIDEMIA 10/19/2006  . OBESITY, NOS 10/19/2006  . Major depression, recurrent 10/19/2006  . HYPERTENSION, BENIGN SYSTEMIC 10/19/2006  . Recurrent urticaria 10/19/2006    Past Surgical History  Procedure Laterality Date  . Abdominal hysterectomy  1977    fibroid  . Joint replacement  2009    rt hip  . Total hip arthroplasty Left 05/12/2014    dr Mayer Camel  . Total hip arthroplasty Left 05/12/2014    Procedure: TOTAL HIP ARTHROPLASTY;  Surgeon: Kerin Salen, MD;  Location: [place];  Service: Orthopedics;  Laterality: Left;    Allergies Lovastatin; Statins; Sulfa antibiotics; Codeine; and Niacin  Social History History  Substance Use Topics  . Smoking status: Former Smoker -- 1.00 packs/day for 45 years    Types: Cigarettes    Quit date: 06/16/1999  . Smokeless tobacco: Never Used  . Alcohol Use: No    Review of Systems Constitutional: Negative for fever. Eyes: Negative for visual changes. ENT: Negative for sore throat. Cardiovascular: Negative for chest pain. Respiratory: Negative for shortness of breath. Gastrointestinal: Negative for abdominal pain, vomiting and diarrhea. Genitourinary: Positive for inability to urinate Musculoskeletal: Positive for chronic back pain Skin: Negative for rash. Neurological: Negative for headaches, focal weakness or numbness.  10-point ROS otherwise  negative.  ____________________________________________   PHYSICAL EXAM:  VITAL SIGNS: ED Triage Vitals  Enc Vitals Group     BP 02/16/15 1628 137/57 mmHg     Pulse Rate 02/16/15 1628 83     Resp 02/16/15 1628 18     Temp 02/16/15 1628 98.3 F (36.8  C)     Temp Source 02/16/15 1628 Oral     SpO2 02/16/15 1628 96 %     Weight 02/16/15 1628 170 lb 4.8 oz (77.248 kg)     Height 02/16/15 1628 5\' 2"  (1.575 m)     Head Cir --      Peak Flow --      Pain Score 02/16/15 1629 0     Pain Loc --      Pain Edu? --      Excl. in [place]? --     Constitutional: Alert and oriented. Well appearing and in no distress. Eyes: Conjunctivae are normal. PERRL. Normal extraocular movements. ENT   Head: Normocephalic and atraumatic.   Nose: No congestion/rhinnorhea.   Mouth/Throat: Mucous membranes are moist.   Neck: No stridor. Hematological/Lymphatic/Immunilogical: No cervical lymphadenopathy. Cardiovascular: Normal rate, regular rhythm. Normal and symmetric distal pulses are present in all extremities. No murmurs, rubs, or gallops. Respiratory: Normal respiratory effort without tachypnea nor retractions. Breath sounds are clear and equal bilaterally. No wheezes/rales/rhonchi. Gastrointestinal: Soft and nontender. No distention. No abdominal bruits. There is no CVA tenderness. Musculoskeletal: Nontender with normal range of motion in all extremities. No joint effusions.  No lower extremity tenderness nor edema. Neurologic:  Normal speech and language. No gross focal neurologic deficits are appreciated. Speech is normal. No gait instability. Skin:  Skin is warm, dry and intact. No rash noted. Psychiatric: Mood and affect are normal. Speech and behavior are normal. Patient exhibits appropriate insight and judgment. ____________________________________________  ED COURSE:  Pertinent labs & imaging results that were available during my care of the patient were reviewed by me and considered in my medical decision making (see chart for details). Patient will need basic labs, bladder scan and reevaluation ____________________________________________    LABS (pertinent positives/negatives)  Labs Reviewed  URINALYSIS COMPLETEWITH MICROSCOPIC  ([facility] ONLY) - Abnormal; Notable for the following:    Color, Urine YELLOW (*)    APPearance CLEAR (*)    All other components within normal limits  CBC - Abnormal; Notable for the following:    RBC 3.43 (*)    Hemoglobin 9.4 (*)    HCT 29.1 (*)    RDW 15.3 (*)    Platelets 528 (*)    All other components within normal limits  COMPREHENSIVE METABOLIC PANEL - Abnormal; Notable for the following:    Glucose, Bld 185 (*)    BUN 34 (*)    Creatinine, Ser 2.33 (*)    Calcium 8.5 (*)    Total Protein 6.4 (*)    Albumin 3.2 (*)    ALT 8 (*)    GFR calc non Af Amer 18 (*)    GFR calc Af Amer 21 (*)    All other components within normal limits    RADIOLOGY Images were viewed by me  Renal ultrasound IMPRESSION: Enlarging hypoechoic complex fluid collection adjacent to the right Kidney. This could represent enlarging perinephric hematoma or abscess.  Increased echotexture within the kidneys bilaterally.  No hydronephrosis. ____________________________________________  FINAL ASSESSMENT AND PLAN  Urinary retention  Plan: Patient with labs and ultrasound findings as above. Discussed this case with Dr. Candiss Norse nephrology who  states he will follow up with her as an outpatient. Her urine her labs are unremarkable. She'll continue home with a Foley catheter and leg bag. She will also be referred to urology for follow-up.   Earleen Newport, MD   Earleen Newport, MD 02/16/15 1928

## 2015-02-16 NOTE — ED Notes (Signed)
Pt. Arrived to ed from home with reports of urine retention.  Pt. And pt's daughter-in-law reports that pt has experienced decrease urine output and was sent by Dr. Candiss Norse for a possible foley catheter. Pt. Was recently hospitlized in June for pyelonephritis. PT received a renal ultrasound today.  Pt alert and oriented. Pt denies Pain./

## 2015-02-16 NOTE — Discharge Instructions (Signed)
Acute Urinary Retention °Acute urinary retention is the temporary inability to urinate. This is an uncommon problem in women. It can be caused by: °· Infection. °· A side effect of a medicine. °· A problem in a nearby organ that presses or squeezes on the bladder or the urethra (the tube that drains the bladder). °· Psychological problems. °·  Surgery on your bladder, urethra, or pelvic organs that causes obstruction to the outflow of urine from your bladder. °HOME CARE INSTRUCTIONS  °If you are sent home with a Foley catheter and a drainage system, you will need to discuss the best course of action with your health care provider. While the catheter is in, maintain a good intake of fluids. Keep the drainage bag emptied and lower than your catheter. This is so that contaminated urine will not flow back into your bladder, which could lead to a urinary tract infection. °There are two main types of drainage bags. One is a large bag that usually is used at night. It has a good capacity that will allow you to sleep through the night without having to empty it. The second type is called a leg bag. It has a smaller capacity so it needs to be emptied more frequently. However, the main advantage is that it can be attached by a leg strap and goes underneath your clothing, allowing you the freedom to move about or leave your home. °Only take over-the-counter or prescription medicines for pain, discomfort, or fever as directed by your health care provider.  °SEEK MEDICAL CARE IF: °· You develop a low-grade fever. °· You experience spasms or leakage of urine with the spasms. °SEEK IMMEDIATE MEDICAL CARE IF:  °· You develop chills or fever. °· Your catheter stops draining urine. °· Your catheter falls out. °· You start to develop increased bleeding that does not respond to rest and increased fluid intake. °MAKE SURE YOU: °· Understand these instructions. °· Will watch your condition. °· Will get help right away if you are not  doing well or get worse. °Document Released: 08/07/2006 Document Revised: 05/29/2013 Document Reviewed: 01/17/2013 °Exit[employer]® Patient Information ©2015 [employer]. This information is not intended to replace advice given to you by your health care provider. Make sure you discuss any questions you have with your health care provider. ° °

## 2015-02-16 NOTE — ED Notes (Signed)
Pt's drainage bag switched out for leg bag, pt tolerated well. Verbalized and demonstrated correct usage and drainage.

## 2015-02-19 ENCOUNTER — Encounter: Payer: Self-pay | Admitting: Family Medicine

## 2015-02-19 ENCOUNTER — Ambulatory Visit (INDEPENDENT_AMBULATORY_CARE_PROVIDER_SITE_OTHER): Payer: Medicare PPO | Admitting: Family Medicine

## 2015-02-19 VITALS — BP 122/52 | HR 80 | Temp 98.1°F | Ht 62.5 in | Wt 167.8 lb

## 2015-02-19 DIAGNOSIS — N151 Renal and perinephric abscess: Secondary | ICD-10-CM

## 2015-02-19 DIAGNOSIS — I10 Essential (primary) hypertension: Secondary | ICD-10-CM | POA: Diagnosis not present

## 2015-02-19 DIAGNOSIS — N183 Chronic kidney disease, stage 3 unspecified: Secondary | ICD-10-CM

## 2015-02-19 DIAGNOSIS — E119 Type 2 diabetes mellitus without complications: Secondary | ICD-10-CM | POA: Diagnosis not present

## 2015-02-19 DIAGNOSIS — R339 Retention of urine, unspecified: Secondary | ICD-10-CM

## 2015-02-19 LAB — HM DIABETES FOOT EXAM

## 2015-02-19 MED ORDER — SITAGLIPTIN PHOSPHATE 25 MG PO TABS: 25.0000 mg | ORAL_TABLET | Freq: Every day | ORAL | Status: DC

## 2015-02-19 NOTE — Progress Notes (Signed)
Pre visit review using our clinic review tool, if applicable. No additional management support is needed unless otherwise documented below in the visit note. 

## 2015-02-19 NOTE — Patient Instructions (Addendum)
Start januvia low dose. Decrease carbohydrates in diet.  As able work on increasing exercise, every day do a little more. Cancel medicare wellness and labs in July and move back to 3 months from now.  Follow CBGs at home, fasting.. Call in in a few weeks with blood sugars.

## 2015-02-19 NOTE — Progress Notes (Signed)
Subjective:    Patient ID: Jill [name], female    DOB: [birth year], 81 y.o.   MRN: [id number]  HPI   81 year old previously healthy female with history of CKD stage 3 ( baseline Cr 1.2 to 1.6), diabetes, HTN presents for follow up.  She was admitted to [facility] from 01/26/2015 to 02/01/2015 for acute pyelonephritis and new renal lesion, possible abcess. She was diagnosed after having symptoms of N/V/ fever and fatigue.  She has seen nephrologist, Dr. Candiss Norse on 02/13/2015 Recommended following renal US.  Renal US on 6/27 showed: IMPRESSION: Enlarging hypoechoic complex fluid collection adjacent to the right Kidney. This could represent enlarging perinephric hematoma or abscess. Increased echotexture within the kidneys bilaterally. No hydronephrosis.   She had a return to ER on 6/27 for urinary retension.  Foley catheter placed.  Pt to follow up with Dr. Candiss Norse.  CKD;Cr 1.97 to 2.13 now at 2.33  Off antibiotics. She has also stopped metformin and lasix.  Also referred to urology.Marland Kitchen appt next Wed.  She reports she feels well otherwise. Still weak, eating well. Getting stronger but not able to walk long distance. No fever.  She has had improved shortness of breath.  No edema in ankles.  ECHO: nml.   Dm poor control on A1C at hospitalization, no checking regally. Lab Results  Component Value Date   HGBA1C 7.3* 01/28/2015     Wt Readings from Last 3 Encounters:  02/19/15 167 lb 12.8 oz (76.114 kg)  02/16/15 170 lb 4.8 oz (77.248 kg)  02/05/15 177 lb 8 oz (80.513 kg)   DM: not checking CBGs at home. CBG 185 02/16/2015 Now off metformin.  Anemia improving slightly, nml iron nml last check.  Erythropoietin nml. Looks like anemia of chronic disease.      Review of Systems  Constitutional: Negative for fever and fatigue.  HENT: Negative for ear pain.   Eyes: Negative for pain.  Respiratory: Negative for chest tightness and shortness of breath.   Cardiovascular:  Negative for chest pain, palpitations and leg swelling.  Gastrointestinal: Negative for abdominal pain.  Genitourinary: Negative for dysuria.       Objective:   Physical Exam  Constitutional: Vital signs are normal. She appears well-developed and well-nourished. She is cooperative.  Non-toxic appearance. She does not appear ill. No distress.  Elderly female in NAD  HENT:  Head: Normocephalic.  Right Ear: Hearing, tympanic membrane, external ear and ear canal normal. Tympanic membrane is not erythematous, not retracted and not bulging.  Left Ear: Hearing, tympanic membrane, external ear and ear canal normal. Tympanic membrane is not erythematous, not retracted and not bulging.  Nose: No mucosal edema or rhinorrhea. Right sinus exhibits no maxillary sinus tenderness and no frontal sinus tenderness. Left sinus exhibits no maxillary sinus tenderness and no frontal sinus tenderness.  Mouth/Throat: Uvula is midline, oropharynx is clear and moist and mucous membranes are normal.  Eyes: Conjunctivae, EOM and lids are normal. Pupils are equal, round, and reactive to light. Lids are everted and swept, no foreign bodies found.  Neck: Trachea normal and normal range of motion. Neck supple. Carotid bruit is not present. No thyroid mass and no thyromegaly present.  Cardiovascular: Normal rate, regular rhythm, S1 normal, S2 normal, normal heart sounds, intact distal pulses and normal pulses.  Exam reveals no gallop and no friction rub.   No murmur heard. Pulmonary/Chest: Effort normal and breath sounds normal. No tachypnea. No respiratory distress. She has no decreased breath sounds. She has  no wheezes. She has no rhonchi. She has no rales.  Abdominal: Soft. Normal appearance and bowel sounds are normal. There is no tenderness.  Neurological: She is alert.  Skin: Skin is warm, dry and intact. No rash noted.  Psychiatric: Her speech is normal and behavior is normal. Judgment and thought content normal. Her  mood appears not anxious. Cognition and memory are normal. She does not exhibit a depressed mood.  Foley cath in place.  Diabetic foot exam: Normal inspection No skin breakdown No calluses  Normal DP pulses Normal sensation to light touch and monofilament Nails normal       Assessment & Plan:

## 2015-02-25 ENCOUNTER — Ambulatory Visit (INDEPENDENT_AMBULATORY_CARE_PROVIDER_SITE_OTHER): Payer: Self-pay | Admitting: Urology

## 2015-02-25 ENCOUNTER — Telehealth: Payer: Self-pay | Admitting: Urology

## 2015-02-25 ENCOUNTER — Encounter: Payer: Self-pay | Admitting: Urology

## 2015-02-25 VITALS — BP 125/74 | HR 109 | Ht 62.0 in | Wt 166.5 lb

## 2015-02-25 DIAGNOSIS — N151 Renal and perinephric abscess: Secondary | ICD-10-CM

## 2015-02-25 DIAGNOSIS — S37011A Minor contusion of right kidney, initial encounter: Secondary | ICD-10-CM | POA: Insufficient documentation

## 2015-02-25 DIAGNOSIS — R339 Retention of urine, unspecified: Secondary | ICD-10-CM | POA: Diagnosis not present

## 2015-02-25 LAB — URINALYSIS, COMPLETE
Bilirubin, UA: NEGATIVE
GLUCOSE, UA: NEGATIVE
KETONES UA: NEGATIVE
Nitrite, UA: NEGATIVE
Protein, UA: NEGATIVE
SPEC GRAV UA: 1.015 (ref 1.005–1.030)
Urobilinogen, Ur: 0.2 mg/dL (ref 0.2–1.0)
pH, UA: 6 (ref 5.0–7.5)

## 2015-02-25 LAB — MICROSCOPIC EXAMINATION

## 2015-02-25 LAB — BLADDER SCAN AMB NON-IMAGING: SCAN RESULT: 0

## 2015-02-25 NOTE — Progress Notes (Signed)
02/25/2015 8:28 AM   Jill [name] [birth year] [id number]  Referring provider: Jinny Sanders, MD [address] [place], [place] [address]  Chief Complaint  Patient presents with  . Urinary Retention    Follow up from er    HPI: Jill [name] is an 81 year old white female who is referred to [place] from her PCP, Dr. Diona Browner, for urinary retention and a renal abscess.    Patient presented to the ED on 01/26/2015 c/o fevers, chills, nausea, vomiting and right flank pain.  Urine dip was nitrate positive with 36 white blood cells and many bacteria. She was given IV Rocephin in the emergency room. A noncontrast CT was performed and findings were suspicious for renal abscess  in the right kidney.  She was then admitted to the hospital and IV Rocephin was  continued and then transitioned to [place]. Her urine culture grew out pansensitive Escherichia coli and she was discharged on oral Cipro.  She then returned back to the emergency room on June 14 with a complaint of shortness of breath.  She was treated for bronchitis and discharged to home.    She then reported back to the emergency room on 02/16/2015 with a complaint of urinary retention. Patient's daughter-in-law stated that she was only dribbling urine 2 days prior to her visit to the emergency room. The daughter-in-law also stated the patient did not consume a lot of liquids in the days prior to the emergency room visit. The patient did not have suprapubic pain or discomfort with her urine dribbling. She did have a Foley catheter placement 241 mL of urine returned.  Today, she is not complaining of fevers, chills, nausea, vomiting, gross hematuria or flank pain. She does admit that she does not drink a lot of fluids. She is not currently taking antibiotics. Most recent renal ultrasound completed on 02/16/2015 notes the abscess has increased in size.   PMH: Past Medical History  Diagnosis Date  . Diabetes mellitus   . Hyperlipidemia     . Chronic kidney disease   . Depression   . Allergy   . Hypertension   . Vasovagal syncope 2006    Negative cardiac workup-myoview, echo  . Diverticula, colon   . Family history of anesthesia complication     Son is difficult intubation  . Complication of anesthesia     difficult waking   . Arthritis     osteoarthritis     Surgical History: Past Surgical History  Procedure Laterality Date  . Abdominal hysterectomy  1977    fibroid  . Joint replacement  2009    rt hip  . Total hip arthroplasty Left 05/12/2014    dr Mayer Camel  . Total hip arthroplasty Left 05/12/2014    Procedure: TOTAL HIP ARTHROPLASTY;  Surgeon: Kerin Salen, MD;  Location: [facility];  Service: Orthopedics;  Laterality: Left;    Home Medications:    Medication List       This list is accurate as of: 02/25/15  8:28 AM.  Always use your most recent med list.               ALPRAZolam 0.25 MG tablet  Commonly known as:  XANAX  Take 1 tablet (0.25 mg total) by mouth 2 (two) times daily as needed for anxiety.     aspirin EC 325 MG tablet  Take 1 tablet (325 mg total) by mouth 2 (two) times daily.     Cholecalciferol 1000 UNITS capsule  Take 1,000 Units by mouth daily.     escitalopram 20 MG tablet  Commonly known as:  LEXAPRO  Take 1 tablet (20 mg total) by mouth daily.     fenofibrate 54 MG tablet  TAKE TWO (2) TABLETS BY MOUTH DAILY     sitaGLIPtin 25 MG tablet  Commonly known as:  JANUVIA  Take 1 tablet (25 mg total) by mouth daily.        Allergies:  Allergies  Allergen Reactions  . Lovastatin Other (See Comments)    REACTION: leg pain  . Statins Other (See Comments)    REACTION: leg cramps, weakness  . Sulfa Antibiotics Hives and Itching  . Codeine Rash  . Niacin Rash    Flushing also    Family History: Family History  Problem Relation Age of Onset  . COPD Brother   . Heart disease Brother   . Diabetes Brother   . Lymphoma Brother   . Alzheimer's disease Brother   . Heart  disease Mother   . Pancreatic cancer Sister   . Alzheimer's disease Sister   . Breast cancer Sister   . Leukemia Other     Social History:  reports that she quit smoking about 15 years ago. Her smoking use included Cigarettes. She has a 45 pack-year smoking history. She has never used smokeless tobacco. She reports that she does not drink alcohol or use illicit drugs.  ROS: Urological Symptom Review  Patient is experiencing the following symptoms: Burning/pain with urination Trouble starting stream   Review of Systems  Gastrointestinal (upper)  : Nausea Vomiting  Gastrointestinal (lower) : Negative for lower GI symptoms  Constitutional : Fever Fatigue  Skin: Skin rash/lesion Itching  Eyes: Negative for eye symptoms  Ear/Nose/Throat : Sinus problems  Hematologic/Lymphatic: Easy bruising  Cardiovascular : Negative for cardiovascular symptoms  Respiratory : Shortness of breath  Endocrine: Negative for endocrine symptoms  Musculoskeletal: Back pain  Neurological: Negative for neurological symptoms  Psychologic: Depression   Physical Exam: There were no vitals taken for this visit.  Constitutional:  Alert and oriented, No acute distress. HEENT: [place] AT, moist mucus membranes.  Trachea midline, no masses. Cardiovascular: No clubbing, cyanosis, or edema. Respiratory: Normal respiratory effort, no increased work of breathing. GI: Abdomen is soft, nontender, nondistended, no abdominal masses GU: No CVA tenderness. Atrophic external genitalia.  Urethral caruncle. Foley catheter in place.  No urethral masses and/or tenderness. No bladder fullness or masses. No vaginal lesions or discharge. Normal rectal tone, no masses. Normal anus and perineum.  Skin: No rashes, bruises or suspicious lesions. Lymph: No cervical or inguinal adenopathy. Neurologic: Grossly intact, no focal deficits, moving all 4 extremities. Psychiatric: Normal mood and affect.  Laboratory  Data: Results for orders placed or performed in visit on 02/25/15  Microscopic Examination  Result Value Ref Range   WBC, UA 11-30 (A) 0 -  5 /hpf   RBC, UA 3-10 (A) 0 -  2 /hpf   Epithelial Cells (non renal) 0-10 0 - 10 /hpf   Renal Epithel, UA 0-10 (A) None seen /hpf   Bacteria, UA Moderate (A) None seen/Few  Urinalysis, Complete  Result Value Ref Range   Specific Gravity, UA 1.015 1.005 - 1.030   pH, UA 6.0 5.0 - 7.5   Color, UA Yellow Yellow   Appearance Ur Clear Clear   Leukocytes, UA 3+ (A) Negative   Protein, UA Negative Negative/Trace   Glucose, UA Negative Negative   Ketones, UA Negative Negative  RBC, UA 1+ (A) Negative   Bilirubin, UA Negative Negative   Urobilinogen, Ur 0.2 0.2 - 1.0 mg/dL   Nitrite, UA Negative Negative   Microscopic Examination See below:   BLADDER SCAN AMB NON-IMAGING  Result Value Ref Range   Scan Result 0    Lab Results  Component Value Date   WBC 10.5 02/16/2015   HGB 9.4* 02/16/2015   HCT 29.1* 02/16/2015   MCV 84.9 02/16/2015   PLT 528* 02/16/2015    Lab Results  Component Value Date   CREATININE 2.33* 02/16/2015    No results found for: PSA  No results found for: TESTOSTERONE  Lab Results  Component Value Date   HGBA1C 7.3* 01/28/2015    Urinalysis    Component Value Date/Time   COLORURINE YELLOW* 02/16/2015 1807   APPEARANCEUR CLEAR* 02/16/2015 1807   LABSPEC 1.010 02/16/2015 1807   PHURINE 5.0 02/16/2015 1807   GLUCOSEU NEGATIVE 02/16/2015 1807   HGBUR NEGATIVE 02/16/2015 1807   BILIRUBINUR NEGATIVE 02/16/2015 1807   BILIRUBINUR negative 02/20/2014 1129   KETONESUR NEGATIVE 02/16/2015 1807   PROTEINUR NEGATIVE 02/16/2015 1807   PROTEINUR negative 02/20/2014 1129   UROBILINOGEN 0.2 02/03/2015 1427   UROBILINOGEN 0.2 02/20/2014 1129   NITRITE NEGATIVE 02/16/2015 1807   NITRITE negative 02/20/2014 1129   LEUKOCYTESUR NEGATIVE 02/16/2015 1807    Pertinent Imaging:  CLINICAL DATA: Sudden onset of right flank  pain.  EXAM: CT ABDOMEN AND PELVIS WITHOUT CONTRAST  TECHNIQUE: Multidetector CT imaging of the abdomen and pelvis was performed following the standard protocol without IV contrast.  COMPARISON: None.  FINDINGS: There is a 3 mm subpleural nodule in the right lower lobe. Minimal subpleural fibrosis in the right middle lobe and lingula.  There is moderate perinephric stranding about the right kidney. Subcapsular renal collection measuring 2.3 cm causing compression of the renal parenchyma. The right renal collecting system is poorly defined. There is a 1.3 cm hypodensity in the lower right kidney. No definite stone. The right ureter were visible is decompressed without stone. There is thinning of the left renal parenchyma without hydronephrosis or stone evaluation of the distal ureters is obscured by streak artifact from hip prostheses.  Evaluation of the remaining solid and hollow viscera is limited given lack of contrast. The unenhanced liver, gallbladder, spleen, and left adrenal gland are normal. Pancreas is atrophic without ductal dilatation. Right adrenal gland is obscured.  Small hiatal hernia. Stomach is decompressed. There are no dilated or thickened bowel loops. Diverticulosis of the distal colon without diverticulitis. The appendix is normal. No colonic wall thickening.  No retroperitoneal adenopathy. Abdominal aorta is normal in caliber. Moderate to dense atherosclerosis of the abdominal aorta without aneurysm.  Pelvic not any is obscured by streak artifact and hip prosthesis. The uterus appears surgically absent.  Diffuse degenerative change throughout the spine with degenerative disc disease and facet arthropathy.  IMPRESSION: 1. Heterogeneous of capsule right renal collection measuring up to 2.3 cm causing compression of the renal parenchyma and distorting normal corticomedullary anatomy. There is moderate adjacent perinephric stranding.  Findings may reflect a subcapsular hematoma, however additional subcapsular collections such as ruptured cyst or infected fluid collection are not excluded. Contrast-enhanced exam or MRI recommended if patient is able tolerate. There is no urolithiasis. 2. Incidental findings of the small hiatal hernia. Diverticulosis without diverticulitis.  These results were called by telephone at the time of interpretation on 01/27/2015 at 6:51 am to Dr. Berle Mull , who verbally acknowledged these results.  Electronically Signed  By: Jeb Levering M.D.  On: 01/27/2015 06:53      CLINICAL DATA: Renal abscess.  EXAM: RENAL / URINARY TRACT ULTRASOUND COMPLETE  COMPARISON: CT abdomen and pelvis 01/27/2015  FINDINGS: Right Kidney:  Length: 11.2 cm. No hydronephrosis. A subcapsular collection is again seen, resulting in distortion of the renal parenchyma. The central portion of the collection appears to represent hypo to anechoic fluid measuring 4.8 x 0.9 x 4.2 cm. Surrounding this central hypoechoic fluid is thick hyperechoic material, with the short axis of the collection measuring at least 2.3 cm at the level of the upper pole to interpolar region, similar to that measured on the prior CT, and likely measuring closer to 3.5 cm at the level of the lower pole. The overall size and configuration is similar to that on the prior CT allowing for differences in modality.  Left Kidney:  Length: 10.2 cm. Echogenic parenchyma a suggestive of medical renal disease. No hydronephrosis. 1.6 cm hypoechoic lesion in the interpolar kidney corresponds to a low-density lesion on CT, most likely a cyst.  Bladder:  Appears normal for degree of bladder distention.  A right pleural effusion is partially visualized.  IMPRESSION: 1. Subcapsular right renal collection, overall grossly similar in size to recent CT. 2. Increased echogenicity of the left kidney suggesting  medical renal disease. 3. Right pleural effusion.   Electronically Signed  By: Logan Bores  On: 01/31/2015 07:40         CLINICAL DATA: Right renal abscess  EXAM: RENAL / URINARY TRACT ULTRASOUND COMPLETE  COMPARISON: CT 01/27/2015 and ultrasound 01/31/2015  FINDINGS: Right Kidney:  Length: 9.4 cm. Small cyst in the lower pole measuring 1 cm. Hypoechoic perinephric fluid collection measures 7.8 x 3.1 x. This appears larger than prior ultrasound 4.6 cm. No hydronephrosis. Mildly increased echotexture within the right kidney.  Left Kidney:  Length: 9.5 cm. No hydronephrosis. Mildly increased echotexture with cortical thinning.  Bladder:  Appears normal for degree of bladder distention.  IMPRESSION: Enlarging hypoechoic complex fluid collection adjacent to the right Kidney. This could represent enlarging perinephric hematoma or abscess.  Increased echotexture within the kidneys bilaterally.  No hydronephrosis.   Electronically Signed  By: Rolm Baptise M.D.  On: 02/16/2015 16:06  Assessment & Plan:    1. Right renal abscess/hematoma:   I have reviewed the images with Dr. Erlene Quan.  The area in the right kidney seems to favor a hematoma vs an abscess.  There may also be an underlying RCC.  She is clinically stable at today's visit.  We will obtain a BMP, CBC, UA and UCx.  If labs are stable, we will monitor the area with RUS in 2 weeks.    2. Urinary retention:   Patient admits to limited fluid intake. Her daughter-in-law states that she was only dribbling urine 2 days prior to her emergency room visit. The patient had no suprapubic pain. The return of urine was 241 mL which is somewhat low after not being able to void significantly for 2 days. I believe her urinary symptoms are more related to her lack of fluid intake versus a true urinary retention. Patient is encouraged to increase her fluid intake.  She returned this afternoon after giving  an urine sample and her PVR was 0 mL.   There are no diagnoses linked to this encounter.  No Follow-up on file.  Zara Council, [facility] [address], [place] [address] [phone]

## 2015-02-25 NOTE — Progress Notes (Signed)
Catheter Removal  Patient is present today for a catheter removal.  55ml of water was drained from the balloon. A 16FR foley cath was removed from the bladder(am guessing at size could not read cath size it was rubbed off and I looked back at ER note from placement of cath and size was not mentioned) no complications were noted . Patient tolerated well.  Preformed by: Lyndee Hensen CMA

## 2015-02-25 NOTE — Telephone Encounter (Signed)
Please call the patient and have her come back for the office for a CBC, BMP, UA and UCx.

## 2015-02-26 ENCOUNTER — Other Ambulatory Visit: Payer: Self-pay | Admitting: Urology

## 2015-02-26 DIAGNOSIS — N184 Chronic kidney disease, stage 4 (severe): Secondary | ICD-10-CM | POA: Diagnosis not present

## 2015-02-26 DIAGNOSIS — N2889 Other specified disorders of kidney and ureter: Secondary | ICD-10-CM

## 2015-02-26 LAB — CBC WITH DIFFERENTIAL/PLATELET
BASOS: 0 %
Basophils Absolute: 0 10*3/uL (ref 0.0–0.2)
EOS (ABSOLUTE): 0.1 10*3/uL (ref 0.0–0.4)
EOS: 1 %
HEMATOCRIT: 31.2 % — AB (ref 34.0–46.6)
HEMOGLOBIN: 9.9 g/dL — AB (ref 11.1–15.9)
IMMATURE GRANS (ABS): 0 10*3/uL (ref 0.0–0.1)
IMMATURE GRANULOCYTES: 0 %
Lymphocytes Absolute: 1.3 10*3/uL (ref 0.7–3.1)
Lymphs: 18 %
MCH: 27.7 pg (ref 26.6–33.0)
MCHC: 31.7 g/dL (ref 31.5–35.7)
MCV: 87 fL (ref 79–97)
MONOS ABS: 0.2 10*3/uL (ref 0.1–0.9)
Monocytes: 3 %
NEUTROS ABS: 5.7 10*3/uL (ref 1.4–7.0)
Neutrophils: 78 %
Platelets: 365 10*3/uL (ref 150–379)
RBC: 3.57 x10E6/uL — AB (ref 3.77–5.28)
RDW: 15.4 % (ref 12.3–15.4)
WBC: 7.4 10*3/uL (ref 3.4–10.8)

## 2015-02-26 LAB — BASIC METABOLIC PANEL
BUN / CREAT RATIO: 15 (ref 11–26)
BUN: 33 mg/dL — ABNORMAL HIGH (ref 8–27)
CHLORIDE: 100 mmol/L (ref 97–108)
CO2: 22 mmol/L (ref 18–29)
Calcium: 9 mg/dL (ref 8.7–10.3)
Creatinine, Ser: 2.24 mg/dL — ABNORMAL HIGH (ref 0.57–1.00)
GFR calc Af Amer: 23 mL/min/{1.73_m2} — ABNORMAL LOW (ref >59)
GFR calc non Af Amer: 20 mL/min/{1.73_m2} — ABNORMAL LOW (ref >59)
Glucose: 232 mg/dL — ABNORMAL HIGH (ref 65–99)
POTASSIUM: 4.9 mmol/L (ref 3.5–5.2)
SODIUM: 138 mmol/L (ref 134–144)

## 2015-02-26 NOTE — Telephone Encounter (Signed)
-----   Message from Nori Riis, PA-C sent at 02/26/2015  8:16 AM EDT ----- Her blood work is stable.  We are still waiting for the UCx results.  She will need a RUS in 2 weeks.

## 2015-02-26 NOTE — Telephone Encounter (Signed)
Spoke with pt in reference to lab results and RUS. Pt voiced understanding. Can you please schedule this? Cw,lpn

## 2015-02-27 ENCOUNTER — Encounter: Payer: Self-pay | Admitting: Urology

## 2015-02-27 ENCOUNTER — Telehealth: Payer: Self-pay

## 2015-02-27 DIAGNOSIS — N390 Urinary tract infection, site not specified: Secondary | ICD-10-CM

## 2015-02-27 LAB — CULTURE, URINE COMPREHENSIVE

## 2015-02-27 MED ORDER — CIPROFLOXACIN HCL 250 MG PO TABS: 250.0000 mg | ORAL_TABLET | Freq: Two times a day (BID) | ORAL | Status: AC

## 2015-02-27 NOTE — Telephone Encounter (Signed)
-----   Message from Nori Riis, PA-C sent at 02/27/2015  3:19 PM EDT ----- Patient has a +UCx. She needs to start Cipro 250 mg one tablet twice daily for seven days and then we will check a cath UA when she returns for her RUS in two weeks.

## 2015-02-27 NOTE — Telephone Encounter (Signed)
Yes, this order is in the referral workque.

## 2015-02-27 NOTE — Telephone Encounter (Signed)
Left message on daughter, Estill Bamberg, voicemail in reference to infection. Medication called into pharmacy. Cw,lpn

## 2015-03-02 ENCOUNTER — Telehealth: Payer: Self-pay

## 2015-03-02 NOTE — Telephone Encounter (Signed)
-----   Message from Nori Riis, PA-C sent at 02/27/2015  9:15 AM EDT ----- Her UCx is + but still preliminary.  I will check the labs this afternoon and evening to see if a final is available.  If not, someone will have to look at it on Monday and get her on antibiotics if it is appropriate.

## 2015-03-02 NOTE — Telephone Encounter (Signed)
Spoke with pt in reference to urine cx. Pt stated she had not gotten abt. Advised pt to pick that up and made aware at next visit a cath specimen would be obtained. Pt voiced understanding. Cw,lpn

## 2015-03-09 ENCOUNTER — Ambulatory Visit: Payer: Medicare PPO | Admitting: Internal Medicine

## 2015-03-10 ENCOUNTER — Other Ambulatory Visit: Payer: Medicare PPO

## 2015-03-10 ENCOUNTER — Ambulatory Visit
Admission: RE | Admit: 2015-03-10 | Discharge: 2015-03-10 | Disposition: A | Discharge status: Home / Self Care | Payer: Medicare PPO | Source: Ambulatory Visit | Attending: Urology | Admitting: Urology

## 2015-03-10 DIAGNOSIS — N281 Cyst of kidney, acquired: Secondary | ICD-10-CM | POA: Insufficient documentation

## 2015-03-10 DIAGNOSIS — N2889 Other specified disorders of kidney and ureter: Secondary | ICD-10-CM | POA: Diagnosis present

## 2015-03-10 DIAGNOSIS — N151 Renal and perinephric abscess: Secondary | ICD-10-CM | POA: Diagnosis not present

## 2015-03-11 ENCOUNTER — Telehealth: Payer: Self-pay

## 2015-03-11 NOTE — Telephone Encounter (Signed)
Spoke with pt in reference to RUS results. Pt was transferred to the front to make f/u appt.

## 2015-03-11 NOTE — Telephone Encounter (Signed)
-----   Message from Nori Riis, PA-C sent at 03/10/2015  6:47 PM EDT ----- Patient needs an office visit to discuss renal ultrasound results.

## 2015-03-16 ENCOUNTER — Ambulatory Visit (INDEPENDENT_AMBULATORY_CARE_PROVIDER_SITE_OTHER): Payer: Self-pay | Admitting: Urology

## 2015-03-16 ENCOUNTER — Encounter: Payer: Self-pay | Admitting: Urology

## 2015-03-16 VITALS — BP 152/78 | HR 75 | Resp 18 | Ht 62.0 in | Wt 175.1 lb

## 2015-03-16 DIAGNOSIS — R339 Retention of urine, unspecified: Secondary | ICD-10-CM | POA: Insufficient documentation

## 2015-03-16 DIAGNOSIS — N151 Renal and perinephric abscess: Secondary | ICD-10-CM

## 2015-03-16 DIAGNOSIS — S37011D Minor contusion of right kidney, subsequent encounter: Secondary | ICD-10-CM

## 2015-03-16 LAB — BLADDER SCAN AMB NON-IMAGING

## 2015-03-16 LAB — URINALYSIS, COMPLETE
BILIRUBIN UA: NEGATIVE
Glucose, UA: NEGATIVE
Ketones, UA: NEGATIVE
NITRITE UA: NEGATIVE
Protein, UA: NEGATIVE
RBC, UA: NEGATIVE
Specific Gravity, UA: 1.015 (ref 1.005–1.030)
Urobilinogen, Ur: 0.2 mg/dL (ref 0.2–1.0)
pH, UA: 5 (ref 5.0–7.5)

## 2015-03-16 LAB — MICROSCOPIC EXAMINATION
BACTERIA UA: NONE SEEN
RBC, UA: NONE SEEN /hpf (ref 0–?)

## 2015-03-16 NOTE — Progress Notes (Signed)
03/16/2015 2:24 PM   Jill [name] 02/25/1932 [id number]  Referring provider: Jinny Sanders, MD [address] [place], [place] [address]  Chief Complaint  Patient presents with  . Results  . Urinary Retention    HPI: Jill [name] is an 81 year old white female who is referred to [place] from her PCP, Dr. Diona Browner, for urinary retention and a renal abscess.   Patient presented to the ED on 01/26/2015 c/o fevers, chills, nausea, vomiting and right flank pain. Urine dip was nitrate positive with 36 white blood cells and many bacteria. She was given IV Rocephin in the emergency room. A non contrast CT was performed and findings were suspicious for renal abscess in the right kidney. She was then admitted to the hospital and IV Rocephin was continued and then transitioned to [place]. Her urine culture grew out pan sensitive Escherichia coli and she was discharged on oral Cipro.  She then returned back to the emergency room on June 14 with a complaint of shortness of breath. She was treated for bronchitis and discharged to home.   She then reported back to the emergency room on 02/16/2015 with a complaint of urinary retention. Patient's daughter-in-law stated that she was only dribbling urine 2 days prior to her visit to the emergency room. The daughter-in-law also stated the patient did not consume a lot of liquids in the days prior to the emergency room visit. The patient did not have suprapubic pain or discomfort with her urine dribbling. She did have a Foley catheter placement 241 mL of urine returned.  Her previous renal ultrasound completed on 02/16/2015 noted the abscess has increased in size.  She presents today for a recheck and discuss her renal ultrasound results for the ultrasound that was completed on 03/10/2015 noticed a decrease in the abscess.   She is also feeling well today and has good color about her.  She is urinating without difficulty and is not having flank pain  or hematuria.  Her PVR today is 11 mL.  She also denies any fevers, chills, nausea or vomiting.   PMH: Past Medical History  Diagnosis Date  . Diabetes mellitus   . Hyperlipidemia   . Chronic kidney disease   . Depression   . Allergy   . Hypertension   . Vasovagal syncope 2006    Negative cardiac workup-myoview, echo  . Diverticula, colon   . Family history of anesthesia complication     Son is difficult intubation  . Complication of anesthesia     difficult waking   . Arthritis     osteoarthritis     Surgical History: Past Surgical History  Procedure Laterality Date  . Abdominal hysterectomy  1977    fibroid  . Joint replacement  2009    rt hip  . Total hip arthroplasty Left 05/12/2014    dr Mayer Camel  . Total hip arthroplasty Left 05/12/2014    Procedure: TOTAL HIP ARTHROPLASTY;  Surgeon: Kerin Salen, MD;  Location: [facility];  Service: Orthopedics;  Laterality: Left;    Home Medications:    Medication List       This list is accurate as of: 03/16/15  2:24 PM.  Always use your most recent med list.               ALPRAZolam 0.25 MG tablet  Commonly known as:  XANAX  Take 1 tablet (0.25 mg total) by mouth 2 (two) times daily as needed for anxiety.  aspirin EC 325 MG tablet  Take 1 tablet (325 mg total) by mouth 2 (two) times daily.     Cholecalciferol 1000 UNITS capsule  Take 1,000 Units by mouth daily.     escitalopram 20 MG tablet  Commonly known as:  LEXAPRO  Take 1 tablet (20 mg total) by mouth daily.     fenofibrate 54 MG tablet  TAKE TWO (2) TABLETS BY MOUTH DAILY     sitaGLIPtin 25 MG tablet  Commonly known as:  JANUVIA  Take 1 tablet (25 mg total) by mouth daily.        Allergies:  Allergies  Allergen Reactions  . Lovastatin Other (See Comments)    REACTION: leg pain  . Statins Other (See Comments)    REACTION: leg cramps, weakness  . Sulfa Antibiotics Hives and Itching  . Codeine Rash  . Niacin Rash    Flushing also    Family  History: Family History  Problem Relation Age of Onset  . COPD Brother   . Heart disease Brother   . Diabetes Brother   . Lymphoma Brother   . Alzheimer's disease Brother   . Heart disease Mother   . Pancreatic cancer Sister   . Alzheimer's disease Sister   . Breast cancer Sister   . Leukemia Other   . Kidney disease Neg Hx     Social History:  reports that she quit smoking about 15 years ago. Her smoking use included Cigarettes. She has a 45 pack-year smoking history. She has never used smokeless tobacco. She reports that she does not drink alcohol or use illicit drugs.  ROS: UROLOGY Frequent Urination?: No Hard to postpone urination?: No Burning/pain with urination?: No Get up at night to urinate?: Yes Leakage of urine?: No Urine stream starts and stops?: No Trouble starting stream?: Yes Do you have to strain to urinate?: Yes Blood in urine?: No Urinary tract infection?: No Sexually transmitted disease?: No Injury to kidneys or bladder?: No Painful intercourse?: No Weak stream?: Yes Currently pregnant?: No Vaginal bleeding?: No Last menstrual period?: n  Gastrointestinal Nausea?: No Vomiting?: No Indigestion/heartburn?: No Diarrhea?: No Constipation?: No  Constitutional Fever: No Night sweats?: No Weight loss?: No Fatigue?: No  Skin Skin rash/lesions?: No Itching?: Yes  Eyes Blurred vision?: No Double vision?: No  Ears/Nose/Throat Sore throat?: No Sinus problems?: No  Hematologic/Lymphatic Swollen glands?: No Easy bruising?: Yes  Cardiovascular Leg swelling?: No Chest pain?: No  Respiratory Cough?: Yes Shortness of breath?: Yes  Endocrine Excessive thirst?: No  Musculoskeletal Back pain?: No Joint pain?: No  Neurological Headaches?: No Dizziness?: No  Psychologic Depression?: No Anxiety?: No  Physical Exam: BP 152/78 mmHg  Pulse 75  Resp 18  Ht 5\' 2"  (1.575 m)  Wt 175 lb 1.6 oz (79.425 kg)  BMI 32.02 kg/m2    Laboratory Data: Results for orders placed or performed in visit on 03/16/15  Microscopic Examination  Result Value Ref Range   WBC, UA 0-5 0 -  5 /hpf   RBC, UA None seen 0 -  2 /hpf   Epithelial Cells (non renal) 0-10 0 - 10 /hpf   Casts Present (A) None seen /lpf   Cast Type Hyaline casts N/A   Bacteria, UA None seen None seen/Few  Urinalysis, Complete  Result Value Ref Range   Specific Gravity, UA 1.015 1.005 - 1.030   pH, UA 5.0 5.0 - 7.5   Color, UA Yellow Yellow   Appearance Ur Clear Clear   Leukocytes, UA  Trace (A) Negative   Protein, UA Negative Negative/Trace   Glucose, UA Negative Negative   Ketones, UA Negative Negative   RBC, UA Negative Negative   Bilirubin, UA Negative Negative   Urobilinogen, Ur 0.2 0.2 - 1.0 mg/dL   Nitrite, UA Negative Negative   Microscopic Examination See below:   BLADDER SCAN AMB NON-IMAGING  Result Value Ref Range   Scan Result 78ml     Lab Results  Component Value Date   WBC 7.4 02/25/2015   HGB 9.4* 02/16/2015   HCT 31.2* 02/25/2015   MCV 84.9 02/16/2015   PLT 528* 02/16/2015    Lab Results  Component Value Date   CREATININE 2.24* 02/25/2015    No results found for: PSA  No results found for: TESTOSTERONE  Lab Results  Component Value Date   HGBA1C 7.3* 01/28/2015    Urinalysis    Component Value Date/Time   COLORURINE YELLOW* 02/16/2015 1807   APPEARANCEUR CLEAR* 02/16/2015 1807   LABSPEC 1.010 02/16/2015 1807   PHURINE 5.0 02/16/2015 1807   GLUCOSEU Negative 02/25/2015 1336   HGBUR NEGATIVE 02/16/2015 1807   BILIRUBINUR Negative 02/25/2015 1336   BILIRUBINUR NEGATIVE 02/16/2015 1807   BILIRUBINUR negative 02/20/2014 1129   KETONESUR NEGATIVE 02/16/2015 1807   PROTEINUR NEGATIVE 02/16/2015 1807   PROTEINUR negative 02/20/2014 1129   UROBILINOGEN 0.2 02/03/2015 1427   UROBILINOGEN 0.2 02/20/2014 1129   NITRITE Negative 02/25/2015 1336   NITRITE NEGATIVE 02/16/2015 1807   NITRITE negative 02/20/2014  1129   LEUKOCYTESUR 3+* 02/25/2015 1336   LEUKOCYTESUR NEGATIVE 02/16/2015 1807    Pertinent Imaging: CLINICAL DATA: Right perinephric fluid collection  EXAM: RENAL / URINARY TRACT ULTRASOUND COMPLETE  COMPARISON: 02/16/2015, 02/05/2015  FINDINGS: Right Kidney:  Length: 9.5 cm. A curvilinear hypoechoic Structure is noted along the margin of the right kidney. 6.0 x 4.0 cm. It measures approximately 2.3 cm in thickness. This is an interval decrease in appearance when compared with the prior exam. A few small cysts are noted within the right kidney. The largest of these measures 1 cm.  Left Kidney:  Length: 2.6 cm. Few small cysts are noted. Some mild cortical thinning is seen.  Bladder:  Decompressed partially.  IMPRESSION: Decreasing right perinephric fluid collection consistent with a resolving hematoma.  Bilateral renal cysts.   Electronically Signed  By: Inez Catalina M.D.  On: 03/10/2015 14:19  Assessment & Plan:    1. Right renal abscess/hematoma: Renal ultrasound has demonstrated the renal abscess is decreasing in size. The patient is also feeling well. Her urinalysis today is unremarkable. We will repeat the renal ultrasound in 3 weeks. I will also check a serum creatinine.  Once the abscess has resolved, we will pursue CT scan with and without contrast for further evaluation of the right kidney.  2. Urinary retention: Patient is urinating without difficulty. Her UA is unremarkable. Her PVR is minimal. We will continue to monitor.  - Urinalysis, Complete - BLADDER SCAN AMB NON-IMAGING   No Follow-up on file.  Zara Council, [facility] [address] [place], [place] [address] [phone]

## 2015-03-17 ENCOUNTER — Telehealth: Payer: Self-pay

## 2015-03-17 ENCOUNTER — Encounter: Payer: Medicare PPO | Admitting: Family Medicine

## 2015-03-17 LAB — CREATININE, SERUM
CREATININE: 1.96 mg/dL — AB (ref 0.57–1.00)
GFR calc non Af Amer: 23 mL/min/{1.73_m2} — ABNORMAL LOW (ref >59)
GFR, EST AFRICAN AMERICAN: 27 mL/min/{1.73_m2} — AB (ref >59)

## 2015-03-17 NOTE — Telephone Encounter (Signed)
-----   Message from Nori Riis, PA-C sent at 03/17/2015  7:59 AM EDT ----- Patient's creatinine is improving.

## 2015-03-17 NOTE — Telephone Encounter (Signed)
Spoke with pt in reference to lab results. Pt voiced understanding.  

## 2015-03-18 ENCOUNTER — Other Ambulatory Visit: Payer: Self-pay | Admitting: Family Medicine

## 2015-03-19 NOTE — Telephone Encounter (Signed)
Called in to [facility]. 

## 2015-03-19 NOTE — Telephone Encounter (Signed)
Last office visit 02/05/2015.  Last refilled 01/22/2015 for #20 with no refills.  Ok to refill?

## 2015-04-03 DIAGNOSIS — R339 Retention of urine, unspecified: Secondary | ICD-10-CM | POA: Insufficient documentation

## 2015-04-03 NOTE — Assessment & Plan Note (Addendum)
Inadequate control as now of metfomrin given CKD. Start januvia low dose. Decrease carbohydrates in diet.  As able work on increasing exercise, every day do a little more.

## 2015-04-03 NOTE — Assessment & Plan Note (Signed)
Follow with urology as scheduled

## 2015-04-03 NOTE — Assessment & Plan Note (Signed)
Well controlled. Continue current medication.  

## 2015-04-03 NOTE — Assessment & Plan Note (Signed)
Followed by Dr. Candiss Norse at [facility]

## 2015-04-03 NOTE — Assessment & Plan Note (Signed)
Has catheter in place. Follow up with uri scheduled.

## 2015-04-06 ENCOUNTER — Ambulatory Visit
Admission: RE | Admit: 2015-04-06 | Discharge: 2015-04-06 | Disposition: A | Discharge status: Home / Self Care | Payer: Medicare PPO | Source: Ambulatory Visit | Attending: Urology | Admitting: Urology

## 2015-04-06 DIAGNOSIS — N151 Renal and perinephric abscess: Secondary | ICD-10-CM | POA: Insufficient documentation

## 2015-04-06 DIAGNOSIS — N281 Cyst of kidney, acquired: Secondary | ICD-10-CM | POA: Diagnosis not present

## 2015-05-05 ENCOUNTER — Encounter: Payer: Self-pay | Admitting: Family Medicine

## 2015-05-05 ENCOUNTER — Other Ambulatory Visit: Payer: Self-pay

## 2015-05-05 ENCOUNTER — Telehealth: Payer: Self-pay | Admitting: Urology

## 2015-05-05 ENCOUNTER — Ambulatory Visit (INDEPENDENT_AMBULATORY_CARE_PROVIDER_SITE_OTHER): Payer: Medicare PPO | Admitting: Family Medicine

## 2015-05-05 VITALS — BP 140/80 | HR 83 | Temp 98.4°F | Ht 62.0 in | Wt 174.5 lb

## 2015-05-05 DIAGNOSIS — N179 Acute kidney failure, unspecified: Secondary | ICD-10-CM | POA: Diagnosis not present

## 2015-05-05 DIAGNOSIS — Z23 Encounter for immunization: Secondary | ICD-10-CM | POA: Diagnosis not present

## 2015-05-05 DIAGNOSIS — N281 Cyst of kidney, acquired: Secondary | ICD-10-CM

## 2015-05-05 DIAGNOSIS — N151 Renal and perinephric abscess: Secondary | ICD-10-CM

## 2015-05-05 DIAGNOSIS — M5417 Radiculopathy, lumbosacral region: Secondary | ICD-10-CM | POA: Diagnosis not present

## 2015-05-05 DIAGNOSIS — N189 Chronic kidney disease, unspecified: Secondary | ICD-10-CM

## 2015-05-05 DIAGNOSIS — M5416 Radiculopathy, lumbar region: Secondary | ICD-10-CM | POA: Insufficient documentation

## 2015-05-05 NOTE — Patient Instructions (Signed)
Start tylenol extra strength per the instruction for pain as needed.  Heat, start gentle stretching and home PT.  Call if not improving in 2 weeks.

## 2015-05-05 NOTE — Progress Notes (Signed)
Subjective:    Patient ID: Jill [name], female    DOB: [birth year], 81 y.o.   MRN: [id number]  HPI  81 year old female with history of arthritis in left hip, S/P bilateral  hip replacement ( right 2005, left 2015)  by Dr. Mayer Camel presents with new onset left hip pain.  She reports  1 week ago, sudden onset pain in left buttock. Intermittant  With movement. Some low back pain as well. Radiates down left leg.  No numbness, some mild weakness.   Pain worst with walking. Has tried tizanidine for muscle relaxant.  Hx sciatica... Many years ago.  No known fall, no change in activity.  Has appt for DM with labs prior in  05/2015   She has current renal abscess.. Decreasing in size over time. Due for repeat US renal every 4 weeks.  CRF: Cr 1.96 down from 2.33 to 2.24.next day. She is having renal US tommorow  Or      Review of Systems  Constitutional: Negative for fever and fatigue.  HENT: Negative for ear pain.   Eyes: Negative for pain.  Respiratory: Negative for shortness of breath.   Cardiovascular: Negative for chest pain, palpitations and leg swelling.  Gastrointestinal: Negative for abdominal pain.       Objective:   Physical Exam  Constitutional: Vital signs are normal. She appears well-developed and well-nourished. She is cooperative.  Non-toxic appearance. She does not appear ill. No distress.  HENT:  Head: Normocephalic.  Right Ear: Hearing, tympanic membrane, external ear and ear canal normal. Tympanic membrane is not erythematous, not retracted and not bulging.  Left Ear: Hearing, tympanic membrane, external ear and ear canal normal. Tympanic membrane is not erythematous, not retracted and not bulging.  Nose: No mucosal edema or rhinorrhea. Right sinus exhibits no maxillary sinus tenderness and no frontal sinus tenderness. Left sinus exhibits no maxillary sinus tenderness and no frontal sinus tenderness.  Mouth/Throat: Uvula is midline, oropharynx is clear and moist  and mucous membranes are normal.  Eyes: Conjunctivae, EOM and lids are normal. Pupils are equal, round, and reactive to light. Lids are everted and swept, no foreign bodies found.  Neck: Trachea normal and normal range of motion. Neck supple. Carotid bruit is not present. No thyroid mass and no thyromegaly present.  Cardiovascular: Normal rate, regular rhythm, S1 normal, S2 normal, normal heart sounds, intact distal pulses and normal pulses.  Exam reveals no gallop and no friction rub.   No murmur heard. Pulmonary/Chest: Effort normal and breath sounds normal. No tachypnea. No respiratory distress. She has no decreased breath sounds. She has no wheezes. She has no rhonchi. She has no rales.  Abdominal: Soft. Normal appearance and bowel sounds are normal. There is no hepatosplenomegaly. There is no tenderness. There is no CVA tenderness.  Musculoskeletal:       Lumbar back: She exhibits decreased range of motion and tenderness. She exhibits no bony tenderness and no swelling.       Back:  ttp over left lower back and left sciatic notch, positive SLR on left mildly, neg Faber's  NO anterior or lateral hip pain  Neurological: She is alert.  Skin: Skin is warm, dry and intact. No rash noted.  Psychiatric: Her speech is normal and behavior is normal. Judgment and thought content normal. Her mood appears not anxious. Cognition and memory are normal. She does not exhibit a depressed mood.          Assessment & Plan:

## 2015-05-05 NOTE — Telephone Encounter (Signed)
I spoke with the patient's daughter-in-law who is upset that her mother-in-law did not have a follow up appointment with [place] after her RUS in August.  I apologized and stated I would look into why this happened.  She is currently due for her next RUS and we will schedule that STAT.  Estill Bamberg (daughter-in-law) is carrying her to her PCP's office (Dr. Josefa Half) at this time.  Estill Bamberg states that Jill [name] is not feeling well and is complaining of back pain.   I reviewed the results of the RUS performed in August with Estill Bamberg.  It demonstrated that the renal abscess is decreasing.  Estill Bamberg or Dr. Josefa Half will contact my if any further questions are needed during or after her office visit.

## 2015-05-05 NOTE — Progress Notes (Signed)
Pre visit review using our clinic review tool, if applicable. No additional management support is needed unless otherwise documented below in the visit note. 

## 2015-05-05 NOTE — Assessment & Plan Note (Signed)
Heat, massage, treat with tylenol ( NSAIDs contraindicated). Start home PT, gentle stretching info given. Pt will do what she can.

## 2015-05-05 NOTE — Assessment & Plan Note (Signed)
Followed by urology. Has renal US tommorow.

## 2015-05-05 NOTE — Assessment & Plan Note (Signed)
Improving kidney function with improvement in size of renal mass/abcess.  Will avoid nephrotoxic or irritating meds.

## 2015-05-12 ENCOUNTER — Telehealth: Payer: Self-pay | Admitting: Radiology

## 2015-05-12 ENCOUNTER — Ambulatory Visit
Admission: RE | Admit: 2015-05-12 | Discharge: 2015-05-12 | Disposition: A | Discharge status: Home / Self Care | Payer: Medicare PPO | Source: Ambulatory Visit | Attending: Urology | Admitting: Urology

## 2015-05-12 ENCOUNTER — Other Ambulatory Visit: Payer: Self-pay

## 2015-05-12 DIAGNOSIS — N151 Renal and perinephric abscess: Secondary | ICD-10-CM | POA: Insufficient documentation

## 2015-05-12 NOTE — Telephone Encounter (Signed)
Pt's daughter notified of Renal US appt on 05/12/15 and f/u appt 05/13/15. Verbalizes understanding.

## 2015-05-13 ENCOUNTER — Ambulatory Visit: Payer: Medicare PPO

## 2015-05-13 ENCOUNTER — Encounter: Payer: Self-pay | Admitting: Urology

## 2015-05-13 ENCOUNTER — Ambulatory Visit (INDEPENDENT_AMBULATORY_CARE_PROVIDER_SITE_OTHER): Payer: Medicare PPO | Admitting: Urology

## 2015-05-13 VITALS — BP 147/75 | HR 88 | Ht 62.0 in | Wt 174.8 lb

## 2015-05-13 DIAGNOSIS — N151 Renal and perinephric abscess: Secondary | ICD-10-CM

## 2015-05-13 NOTE — Progress Notes (Addendum)
3:58 PM   COURTENEY INTERRANTE September 05, 1931 [id number]  Referring provider: Jinny Sanders, MD [address] [place], [place] [address]  Chief Complaint  Patient presents with  . Follow-up    RUS    HPI: Patient is a 81 year old white female with a history of a right renal abscess that we have been following with serial RUS on a monthly basis.  She presents today to discuss the results of the most current ultrasound.     Past History: Patient presented to the ED on 01/26/2015 c/o fevers, chills, nausea, vomiting and right flank pain. Urine dip was nitrate positive with 36 white blood cells and many bacteria. She was given IV Rocephin in the emergency room. A non contrast CT was performed and findings were suspicious for renal abscess in the right kidney. Upon further study, it appeared that there may be a RCC hiding behind the abscess.  She was then admitted to the hospital and IV Rocephin was continued and then transitioned to [place]. Her urine culture grew out pan sensitive Escherichia coli and she was discharged on oral Cipro.  She then returned back to the emergency room on June 14 with a complaint of shortness of breath. She was treated for bronchitis and discharged to home. She then reported back to the emergency room on 02/16/2015 with a complaint of urinary retention. Patient's daughter-in-law stated that she was only dribbling urine 2 days prior to her visit to the emergency room. The daughter-in-law also stated the patient did not consume a lot of liquids in the days prior to the emergency room visit. The patient did not have suprapubic pain or discomfort with her urine dribbling. She did have a Foley catheter placement 241 mL of urine returned.  Today, she is feeling well and without complaints. She is having no difficulty when urinating. She denies dysuria, hematuria or suprapubic pain. She also denies any flank pain, fevers, chills, nausea or vomiting.  Renal ultrasound  completed on 05/12/2015 noted the right lower renal pole hypoechoic area has stabilized since the ultrasound in August 2016.  I have reviewed the films.   PMH: Past Medical History  Diagnosis Date  . Diabetes mellitus   . Hyperlipidemia   . Chronic kidney disease   . Depression   . Allergy   . Hypertension   . Vasovagal syncope 2006    Negative cardiac workup-myoview, echo  . Diverticula, colon   . Family history of anesthesia complication     Son is difficult intubation  . Complication of anesthesia     difficult waking   . Arthritis     osteoarthritis     Surgical History: Past Surgical History  Procedure Laterality Date  . Abdominal hysterectomy  1977    fibroid  . Joint replacement  2009    rt hip  . Total hip arthroplasty Left 05/12/2014    dr Mayer Camel  . Total hip arthroplasty Left 05/12/2014    Procedure: TOTAL HIP ARTHROPLASTY;  Surgeon: Kerin Salen, MD;  Location: [facility];  Service: Orthopedics;  Laterality: Left;    Home Medications:    Medication List       This list is accurate as of: 05/13/15  3:58 PM.  Always use your most recent med list.               ALPRAZolam 0.25 MG tablet  Commonly known as:  XANAX  TAKE 1 TABLET BY MOUTH TWICE A DAY AS  NEEDED FOR ALLERGIES AS DIRECTED.     aspirin EC 325 MG tablet  Take 1 tablet (325 mg total) by mouth 2 (two) times daily.     Cholecalciferol 1000 UNITS capsule  Take 1,000 Units by mouth daily.     escitalopram 20 MG tablet  Commonly known as:  LEXAPRO  Take 1 tablet (20 mg total) by mouth daily.     fenofibrate 54 MG tablet  TAKE TWO (2) TABLETS BY MOUTH DAILY     sitaGLIPtin 25 MG tablet  Commonly known as:  JANUVIA  Take 1 tablet (25 mg total) by mouth daily.     TIZANIDINE HCL PO  Take by mouth as needed.        Allergies:  Allergies  Allergen Reactions  . Lovastatin Other (See Comments)    REACTION: leg pain  . Statins Other (See Comments)    REACTION: leg cramps, weakness  . Sulfa  Antibiotics Hives and Itching  . Codeine Rash  . Niacin Rash    Flushing also    Family History: Family History  Problem Relation Age of Onset  . COPD Brother   . Heart disease Brother   . Diabetes Brother   . Lymphoma Brother   . Alzheimer's disease Brother   . Heart disease Mother   . Pancreatic cancer Sister   . Alzheimer's disease Sister   . Breast cancer Sister   . Leukemia Other   . Kidney disease Neg Hx     Social History:  reports that she quit smoking about 15 years ago. Her smoking use included Cigarettes. She has a 45 pack-year smoking history. She has never used smokeless tobacco. She reports that she does not drink alcohol or use illicit drugs.  ROS: UROLOGY Frequent Urination?: No Hard to postpone urination?: Yes Burning/pain with urination?: No Get up at night to urinate?: Yes Leakage of urine?: No Urine stream starts and stops?: No Trouble starting stream?: Yes Do you have to strain to urinate?: No Blood in urine?: No Urinary tract infection?: No Sexually transmitted disease?: No Injury to kidneys or bladder?: No Painful intercourse?: No Weak stream?: No Currently pregnant?: No Vaginal bleeding?: No Last menstrual period?: hyst  Gastrointestinal Nausea?: No Vomiting?: No Indigestion/heartburn?: No Diarrhea?: No Constipation?: No  Constitutional Fever: No Night sweats?: No Weight loss?: No Fatigue?: Yes  Skin Skin rash/lesions?: No Itching?: No  Eyes Blurred vision?: No Double vision?: No  Ears/Nose/Throat Sore throat?: No Sinus problems?: Yes  Hematologic/Lymphatic Swollen glands?: No Easy bruising?: Yes  Cardiovascular Leg swelling?: No Chest pain?: No  Respiratory Cough?: No Shortness of breath?: No  Endocrine Excessive thirst?: No  Musculoskeletal Back pain?: Yes Joint pain?: No  Neurological Headaches?: No Dizziness?: No  Psychologic Depression?: No Anxiety?: No  Physical Exam: Blood pressure 147/75,  pulse 88, height 5\' 2"  (1.575 m), weight 174 lb 12.8 oz (79.289 kg).  Laboratory Data:  Lab Results  Component Value Date   WBC 7.4 02/25/2015   HGB 9.4* 02/16/2015   HCT 31.2* 02/25/2015   MCV 84.9 02/16/2015   PLT 528* 02/16/2015   Lab Results  Component Value Date   CREATININE 1.96* 03/16/2015   Lab Results  Component Value Date   HGBA1C 7.3* 01/28/2015   Pertinent Imaging:  CLINICAL DATA: Follow-up of right renal abscess.  EXAM: RENAL / URINARY TRACT ULTRASOUND COMPLETE  COMPARISON: Renal ultrasound dated 04/06/2015  FINDINGS: Right Kidney:  Length: 8.9 cm. Diffusely increased echogenicity. There is a hypoechoic area within the lower  pole of the kidney which measures 1.2 x 1.4 x 1.2 cm. , not significantly changed from the most recent exam dated 04/06/2015, however decreased from the study dated 03/10/2015.  Left Kidney:  Length: 9.2 cm. There is diffusely increased echogenicity. Three left renal cysts are noted, the largest within the superior pole measuring 1.8 x 1.2 x 1.7.  Bladder:  Appears normal for degree of bladder distention.  IMPRESSION: Right lower renal pole hypoechoic area, presumed to represent decreasing in size perirenal collection, stable from the most recent exam.  Increased echotexture bilateral kidneys, suggestive of chronic medical renal disease.   Electronically Signed  By: Fidela Salisbury M.D.  On: 05/12/2015 17:02      Assessment & Plan:    1. Right renal abscess/hematoma: Renal ultrasound has demonstrated the renal abscess has stabilized. The patient is also feeling well.  We will pursue CT scan with and without contrast for further evaluation of the right kidney to see if the abscess was associated with a RCC.    BUN and serum creatinine are drawn today.  It is explained to the patient that they will be scheduled for a CT abdomen w/wo and that in rare instances, an allergic reaction can be serious  and even life threatening with the injection of contrast material.   The patient denies any allergies to contrast, iodine and/or seafood and is taking metformin.  Return for CT report.  Zara Council, [facility] [address] [place], [place] [address] [phone]

## 2015-05-14 ENCOUNTER — Telehealth: Payer: Self-pay

## 2015-05-14 LAB — BUN+CREAT
BUN / CREAT RATIO: 15 (ref 11–26)
BUN: 25 mg/dL (ref 8–27)
Creatinine, Ser: 1.7 mg/dL — ABNORMAL HIGH (ref 0.57–1.00)
GFR calc Af Amer: 32 mL/min/{1.73_m2} — ABNORMAL LOW (ref >59)
GFR, EST NON AFRICAN AMERICAN: 28 mL/min/{1.73_m2} — AB (ref >59)

## 2015-05-14 NOTE — Telephone Encounter (Signed)
She would need to see a nephrologist for her kidneys and see what they would suggest.  I can make a referral if they would like.

## 2015-05-14 NOTE — Telephone Encounter (Signed)
Spoke with Estill Bamberg, pt daughter in-law, in reference to kidney function and contrast. Estill Bamberg asked is there anything we can do to get the GFR in appropriate range to have the contrast? Estill Bamberg also stated she will relay the information to the pt and see what she would like to do. Estill Bamberg voiced understanding of information provided today.

## 2015-05-14 NOTE — Telephone Encounter (Signed)
-----   Message from Nori Riis, PA-C sent at 05/14/2015  8:51 AM EDT ----- Patient's GFR is diminished.  We cannot safely administer the contrast for the CT scan.  We can proceed with a CT scan w/o contrast with the understanding that this is a limited study.  We could still potentially miss a kidney cancer.  Please cancel the CT w/wo and order a CT w/o if the patient wants to proceed with a CT w/o.

## 2015-05-15 NOTE — Telephone Encounter (Signed)
Estill Bamberg, pt's daughter in law, called stating pt would like to cancel CT for now.  She will discuss further with pt on what they would like to do next.

## 2015-05-15 NOTE — Telephone Encounter (Signed)
Spoke with Estill Bamberg, pt daughter in-law, in reference to CT. Estill Bamberg stated pt wanted to think about CT for now. Nurse reassured pt that would be ok. Nurse also made [facility] aware she would need to speak to a nephrologist in reference to the GFR. Estill Bamberg stated she would speak with pt in reference to nephrologist and see how she feels about it. Nurse reassured Estill Bamberg that too would be ok.

## 2015-05-19 ENCOUNTER — Telehealth: Payer: Self-pay | Admitting: Urology

## 2015-05-19 NOTE — Telephone Encounter (Signed)
Pt's daughter-in-law, Estill Bamberg, called.  They have made an appt to see a nephrologist, Dr. Murlean Iba, on 05/28/15 @ 2:30 p.m.  Records will be sent

## 2015-05-19 NOTE — Telephone Encounter (Signed)
Great!

## 2015-05-26 ENCOUNTER — Telehealth: Payer: Self-pay | Admitting: Family Medicine

## 2015-05-26 ENCOUNTER — Other Ambulatory Visit (INDEPENDENT_AMBULATORY_CARE_PROVIDER_SITE_OTHER): Payer: Medicare PPO

## 2015-05-26 DIAGNOSIS — E785 Hyperlipidemia, unspecified: Secondary | ICD-10-CM

## 2015-05-26 DIAGNOSIS — E119 Type 2 diabetes mellitus without complications: Secondary | ICD-10-CM

## 2015-05-26 LAB — HEMOGLOBIN A1C: Hgb A1c MFr Bld: 7.1 % — ABNORMAL HIGH (ref 4.6–6.5)

## 2015-05-26 LAB — COMPREHENSIVE METABOLIC PANEL
ALT: 10 U/L (ref 0–35)
AST: 16 U/L (ref 0–37)
Albumin: 3.5 g/dL (ref 3.5–5.2)
Alkaline Phosphatase: 52 U/L (ref 39–117)
BUN: 33 mg/dL — ABNORMAL HIGH (ref 6–23)
CALCIUM: 9.6 mg/dL (ref 8.4–10.5)
CHLORIDE: 108 meq/L (ref 96–112)
CO2: 30 meq/L (ref 19–32)
Creatinine, Ser: 1.67 mg/dL — ABNORMAL HIGH (ref 0.40–1.20)
GFR: 31.27 mL/min — AB (ref >60.00)
Glucose, Bld: 132 mg/dL — ABNORMAL HIGH (ref 70–99)
POTASSIUM: 4.6 meq/L (ref 3.5–5.1)
Sodium: 143 mEq/L (ref 135–145)
Total Bilirubin: 0.3 mg/dL (ref 0.2–1.2)
Total Protein: 6.6 g/dL (ref 6.0–8.3)

## 2015-05-26 LAB — LIPID PANEL
CHOL/HDL RATIO: 3
Cholesterol: 213 mg/dL — ABNORMAL HIGH (ref 0–200)
HDL: 75.2 mg/dL (ref >39.00)
LDL CALC: 120 mg/dL — AB (ref 0–99)
NonHDL: 137.65
Triglycerides: 90 mg/dL (ref 0.0–149.0)
VLDL: 18 mg/dL (ref 0.0–40.0)

## 2015-05-26 NOTE — Telephone Encounter (Signed)
-----   Message from Ellamae Sia sent at 05/18/2015  9:49 AM EDT ----- Regarding: Lab orders for Tuesday, 10.4.16 Patient is scheduled for CPX labs, please order future labs, Thanks , Karna Christmas

## 2015-05-28 ENCOUNTER — Encounter: Payer: Self-pay | Admitting: Urology

## 2015-05-28 DIAGNOSIS — N184 Chronic kidney disease, stage 4 (severe): Secondary | ICD-10-CM | POA: Diagnosis not present

## 2015-05-28 DIAGNOSIS — N2889 Other specified disorders of kidney and ureter: Secondary | ICD-10-CM | POA: Diagnosis not present

## 2015-06-02 ENCOUNTER — Ambulatory Visit (INDEPENDENT_AMBULATORY_CARE_PROVIDER_SITE_OTHER): Payer: Medicare PPO | Admitting: Family Medicine

## 2015-06-02 ENCOUNTER — Encounter: Payer: Self-pay | Admitting: Family Medicine

## 2015-06-02 VITALS — BP 166/86 | HR 83 | Temp 98.6°F | Ht 62.25 in | Wt 173.2 lb

## 2015-06-02 DIAGNOSIS — E785 Hyperlipidemia, unspecified: Secondary | ICD-10-CM

## 2015-06-02 DIAGNOSIS — Z66 Do not resuscitate: Secondary | ICD-10-CM

## 2015-06-02 DIAGNOSIS — Z0000 Encounter for general adult medical examination without abnormal findings: Secondary | ICD-10-CM

## 2015-06-02 DIAGNOSIS — N151 Renal and perinephric abscess: Secondary | ICD-10-CM

## 2015-06-02 DIAGNOSIS — R531 Weakness: Secondary | ICD-10-CM | POA: Insufficient documentation

## 2015-06-02 DIAGNOSIS — N183 Chronic kidney disease, stage 3 unspecified: Secondary | ICD-10-CM

## 2015-06-02 DIAGNOSIS — J209 Acute bronchitis, unspecified: Secondary | ICD-10-CM

## 2015-06-02 DIAGNOSIS — E119 Type 2 diabetes mellitus without complications: Secondary | ICD-10-CM

## 2015-06-02 DIAGNOSIS — J40 Bronchitis, not specified as acute or chronic: Secondary | ICD-10-CM | POA: Insufficient documentation

## 2015-06-02 DIAGNOSIS — I10 Essential (primary) hypertension: Secondary | ICD-10-CM

## 2015-06-02 LAB — HM DIABETES FOOT EXAM

## 2015-06-02 MED ORDER — AZITHROMYCIN 250 MG PO TABS: ORAL_TABLET | ORAL | Status: DC

## 2015-06-02 MED ORDER — BENZONATATE 200 MG PO CAPS: 200.0000 mg | ORAL_CAPSULE | Freq: Two times a day (BID) | ORAL | Status: DC | PRN

## 2015-06-02 NOTE — Progress Notes (Signed)
I have personally reviewed the Medicare Annual Wellness questionnaire and have noted 1. The patient's medical and social history 2. Their use of alcohol, tobacco or illicit drugs 3. Their current medications and supplements 4. The patient's functional ability including ADL's, fall risks, home safety risks and hearing or visual             impairment. 5. Diet and physical activities 6. Evidence for depression or mood disorders 7.         Updated provider list Cognitive evaluation was performed and recorded on pt medicare questionnaire form. The patients weight, height, BMI and visual acuity have been recorded in the chart  I have made referrals, counseling and provided education to the patient based review of the above and I have provided the pt with a written personalized care plan for preventive services.   She has been having cough x 2 weeks. Moist cough. Gradually improving. Some SOB, some wheeze, some fatigue. She feels she may have PNA again. No fever.  In last year she has been hospitalized for acute pyelo and renal abcess. Followed by uro, decreasing in size on renal US. Followed by Dr, Candiss Norse and Dr. Ernestine Conrad. Cannot rule out malignancy given cannot do a IV contrast CT.. May plan MRI or renal US. She is feeling weak overall.   Depression, stable on celexa. She is handling death of husband well. He died on parkinson's in 2014-01-05.  CKD stage 3 likely from HTN and DM. Cr baseline 1.62 -1.67  Diabetes: Moderately well controlled on januvia 25 mg daily... trending in the right direction. She has used prednisone 2-3 times in last month for hives.. Followed by [facility].. May be related to pressure and heat. Lab Results  Component Value Date   HGBA1C 7.1* 05/26/2015  Using medications without difficulties:None  Hypoglycemic episodes:None  Hyperglycemic episodes:None  Feet problems:None  Blood Sugars averaging: fbs 100-150,  Eye exam within last year: due  Wt Readings from Last 3  Encounters:  06/02/15 173 lb 4 oz (78.586 kg)  05/13/15 174 lb 12.8 oz (79.289 kg)  05/05/15 174 lb 8 oz (79.153 kg)   Elevated Cholesterol: She is intolerant of multiple statin. Not at goal < 100 on fenofibrate.  Lab Results  Component Value Date   CHOL 213* 05/26/2015   HDL 75.20 05/26/2015   LDLCALC 120* 05/26/2015   LDLDIRECT 137.6 11/10/2008   TRIG 90.0 05/26/2015   CHOLHDL 3 05/26/2015   HTN: Inadequate control no longer on lisinopril given renal issues. She had been better controlled in last few month. BP Readings from Last 3 Encounters:  06/02/15 166/86  05/13/15 147/75  05/05/15 140/80   Social History /Family History/Past Medical History reviewed and updated if needed.  Review of Systems  Constitutional: Negative for fever and fatigue.  HENT: Negative for ear pain.  Eyes: Negative for pain.  Respiratory: Negative for chest tightness and shortness of breath.  Cardiovascular: Negative for chest pain, palpitations and leg swelling.  Gastrointestinal: Negative for abdominal pain.  Genitourinary: Negative for dysuria.  Objective:   Physical Exam  Constitutional: Vital signs are normal. She appears well-developed and well-nourished. She is cooperative. Non-toxic appearance. She does not appear ill. No distress.  HENT:  Head: Normocephalic.  Right Ear: Hearing, tympanic membrane, external ear and ear canal normal.  Left Ear: Hearing, tympanic membrane, external ear and ear canal normal.  Nose: Nose normal.  Eyes: Conjunctivae, EOM and lids are normal. Pupils are equal, round, and reactive to light. Lids are everted  and swept, no foreign bodies found.  Neck: Trachea normal and normal range of motion. Neck supple. Carotid bruit is not present. No mass and no thyromegaly present.  Cardiovascular: Normal rate, regular rhythm, S1 normal, S2 normal, normal heart sounds and intact distal pulses. Exam reveals no gallop.  No murmur heard.  Pulmonary/Chest:  Effort normal and breath sounds normal. No respiratory distress. She has no wheezes. She has no rhonchi. She has no rales.  Abdominal: Soft. Normal appearance and bowel sounds are normal. She exhibits no distension, no fluid wave, no abdominal bruit and no mass. There is no hepatosplenomegaly. There is no tenderness. There is no rebound, no guarding and no CVA tenderness. No hernia.  Lymphadenopathy:  She has no cervical adenopathy.  She has no axillary adenopathy.  Neurological: She is alert. She has normal strength. No cranial nerve deficit or sensory deficit.  Skin: Skin is warm, dry and intact. No rash noted.  Psychiatric: Her speech is normal and behavior is normal. Judgment normal. Her mood appears not anxious. Cognition and memory are normal. She does not exhibit a depressed mood.    Diabetic foot exam:  Normal inspection  No skin breakdown  No calluses  Normal DP pulses  Normal sensation to light touch and monofilament  Nails normal    ASSESSMENT AND PLAN:  The patient's preventative maintenance and recommended screening tests for an annual wellness exam were reviewed in full today. Brought up to date unless services declined.  Counselled on the importance of diet, exercise, and its role in overall health and mortality. The patient's FH and SH was reviewed, including their home life, tobacco status, and drug and alcohol status.   Vaccines:Uptodate pneumovax, prevnar, flu and TDAp PAP/DVE: not indicated Mammo: not indicated DEXA: last 2009, normal, she refuses repeat today, will reconsider next year. Colon: last one several years ago, nml per pt, Dr. Collene Mares. No family history of colon cancer. None indicated.

## 2015-06-02 NOTE — Assessment & Plan Note (Signed)
Inadequate control.. discussed adding zetia as pt intolerant to statins. Continue fibrate. Pt wishes to wait until feeling better overall before adding a chol med.

## 2015-06-02 NOTE — Addendum Note (Signed)
Addended by: Eliezer Lofts E on: 06/02/2015 12:36 PM   Modules accepted: SmartSet

## 2015-06-02 NOTE — Progress Notes (Signed)
Pre visit review using our clinic review tool, if applicable. No additional management support is needed unless otherwise documented below in the visit note. 

## 2015-06-02 NOTE — Patient Instructions (Addendum)
Work on low carb, low cholesterol diet, increase water. Make sure to have eye exam. Follow BP at home.. Call or email BP result in 1-2 weeks. Goal < 140/90.

## 2015-06-02 NOTE — Assessment & Plan Note (Signed)
>   2 weeks of cough, LCTAB... Will cover with antibiotics and treat with cough supressant.

## 2015-06-02 NOTE — Assessment & Plan Note (Signed)
Borderline control off meds. Pt will follow at home if > 140/90 will call Dr. Candiss Norse about reinitiating lisinopril or other med to treat.

## 2015-06-02 NOTE — Assessment & Plan Note (Signed)
Followed by uro and nephrology.

## 2015-06-02 NOTE — Assessment & Plan Note (Signed)
Improving control with januvia over the last few months despite prednisone . Continue to work on healthy eating habits and walk as tolerated.

## 2015-06-02 NOTE — Assessment & Plan Note (Signed)
Likely multifactorial .. Recent renal illness, upper respiratory track illness, chronic disease including anemia.  If not improving re-eval cbc and TSH.

## 2015-06-10 NOTE — Telephone Encounter (Signed)
Did Estill Bamberg make a decision regarding seen a nephrologist?

## 2015-06-11 NOTE — Telephone Encounter (Signed)
Yes, they had an appt and went while you were gone.

## 2015-06-11 NOTE — Telephone Encounter (Signed)
LMOM-Shannon speaking with MD.

## 2015-06-11 NOTE — Telephone Encounter (Signed)
The nephrologist recommended a noncontrast MRI versus serial imaging versus a partial nephrectomy. I will need to speak with one of the physicians regarding which option to pursue and will get back with [facility]

## 2015-06-16 ENCOUNTER — Other Ambulatory Visit: Payer: Self-pay | Admitting: Family Medicine

## 2015-06-17 ENCOUNTER — Other Ambulatory Visit: Payer: Self-pay | Admitting: Family Medicine

## 2015-06-17 NOTE — Telephone Encounter (Signed)
Last office visit 06/02/2015.  Last refilled 03/19/2015 for #20 with no refills.  Ok to refill?

## 2015-06-18 NOTE — Telephone Encounter (Signed)
Alprazolam called into [facility]. 

## 2015-06-23 ENCOUNTER — Telehealth: Payer: Self-pay | Admitting: Urology

## 2015-06-23 NOTE — Telephone Encounter (Signed)
Pt's daughter-in-law, Estill Bamberg, spoke with Ramona about 3 weeks ago.  Ramona told her that Larene Beach would need to meet with a group of doctors re her mother-in-laws case and the suggestions from Dr. Candiss Norse.  She told Estill Bamberg that Larene Beach would get back in touch with her regarding the next steps or plan.  Estill Bamberg has not heard from anyone.  Can you please give Estill Bamberg a call at [phone].

## 2015-06-23 NOTE — Telephone Encounter (Signed)
If it is possible, I would like to discuss this case with Dr. Erlene Quan.  Dr. Erlene Quan is the doctor I spoke with originally.  She will be back on Nov. 10th.  If they want an answer sooner, I could discuss it with another doctor.

## 2015-06-23 NOTE — Telephone Encounter (Signed)
Spoke to Mat[place]y in reference to pt. Jill [name] stated she was more than ok with Jill [name] speaking with Dr. Erlene Quan. Jill [name] just wanted to f/u and not get lost in the mix. Nurse reassured Jill [name] we had not forgotten her or pt just want to do the best we can.

## 2015-06-24 ENCOUNTER — Encounter: Payer: Self-pay | Admitting: Primary Care

## 2015-06-24 ENCOUNTER — Ambulatory Visit (INDEPENDENT_AMBULATORY_CARE_PROVIDER_SITE_OTHER): Payer: Medicare PPO | Admitting: Primary Care

## 2015-06-24 VITALS — BP 144/84 | HR 74 | Temp 97.5°F | Ht 62.25 in | Wt 175.8 lb

## 2015-06-24 DIAGNOSIS — R339 Retention of urine, unspecified: Secondary | ICD-10-CM | POA: Diagnosis not present

## 2015-06-24 DIAGNOSIS — D649 Anemia, unspecified: Secondary | ICD-10-CM

## 2015-06-24 DIAGNOSIS — R5383 Other fatigue: Secondary | ICD-10-CM | POA: Diagnosis not present

## 2015-06-24 DIAGNOSIS — N390 Urinary tract infection, site not specified: Secondary | ICD-10-CM | POA: Diagnosis not present

## 2015-06-24 LAB — CBC
HEMATOCRIT: 35.3 % — AB (ref 36.0–46.0)
HEMOGLOBIN: 11.2 g/dL — AB (ref 12.0–15.0)
MCHC: 31.8 g/dL (ref 30.0–36.0)
MCV: 84.8 fl (ref 78.0–100.0)
PLATELETS: 362 10*3/uL (ref 150.0–400.0)
RBC: 4.17 Mil/uL (ref 3.87–5.11)
RDW: 16.6 % — ABNORMAL HIGH (ref 11.5–15.5)
WBC: 12.2 10*3/uL — ABNORMAL HIGH (ref 4.0–10.5)

## 2015-06-24 LAB — POCT URINALYSIS DIPSTICK
Bilirubin, UA: NEGATIVE
Blood, UA: NEGATIVE
GLUCOSE UA: NEGATIVE
KETONES UA: NEGATIVE
NITRITE UA: NEGATIVE
PH UA: 6
Protein, UA: NEGATIVE
Spec Grav, UA: 1.025
Urobilinogen, UA: NEGATIVE

## 2015-06-24 MED ORDER — CEPHALEXIN 500 MG PO CAPS: 500.0000 mg | ORAL_CAPSULE | Freq: Two times a day (BID) | ORAL | Status: DC

## 2015-06-24 NOTE — Progress Notes (Signed)
Pre visit review using our clinic review tool, if applicable. No additional management support is needed unless otherwise documented below in the visit note. 

## 2015-06-24 NOTE — Progress Notes (Signed)
Subjective:    Patient ID: Jill [name], female    DOB: 13-Nov-1931, 81 y.o.   MRN: [id number]  HPI  Jill [name] is an 81 year old female who presents today with a chief complaint of urinary retention and incontinence. Her symptoms have been present for the past 1-2 weeks. She also has symptoms of suprapubic pressure with difficulty initiating urination. Denies fevers, hematuria, abdominal pain, and vaginal discharge.  She has a history of CKD stage III and is followed by urology and nephrology. She has increased her intake of water but has not taken anything OTC.  Review of Systems  Constitutional: Positive for fatigue. Negative for fever and chills.  Respiratory: Negative for shortness of breath.   Cardiovascular: Negative for chest pain.  Genitourinary: Positive for difficulty urinating. Negative for dysuria and flank pain.       Past Medical History  Diagnosis Date  . Diabetes mellitus   . Hyperlipidemia   . Chronic kidney disease   . Depression   . Allergy   . Hypertension   . Vasovagal syncope 2006    Negative cardiac workup-myoview, echo  . Diverticula, colon   . Family history of anesthesia complication     Son is difficult intubation  . Complication of anesthesia     difficult waking   . Arthritis     osteoarthritis     Social History   Social History  . Marital Status: Widowed    Spouse Name: N/A  . Number of Children: N/A  . Years of Education: N/A   Occupational History  . Not on file.   Social History Main Topics  . Smoking status: Former Smoker -- 1.00 packs/day for 45 years    Types: Cigarettes    Quit date: 06/16/1999  . Smokeless tobacco: Never Used     Comment: quit 15 years ago  . Alcohol Use: No  . Drug Use: No  . Sexual Activity: Not Currently   Other Topics Concern  . 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 Diet: healthy diet      Son Jill [name]) [place] , has living will and DNR ( reviewed 2015)                Past Surgical History  Procedure Laterality Date  . Abdominal hysterectomy  1977    fibroid  . Joint replacement  2009    rt hip  . Total hip arthroplasty Left 05/12/2014    dr Mayer Camel  . Total hip arthroplasty Left 05/12/2014    Procedure: TOTAL HIP ARTHROPLASTY;  Surgeon: Kerin Salen, MD;  Location: [place];  Service: Orthopedics;  Laterality: Left;    Family History  Problem Relation Age of Onset  . COPD Brother   . Heart disease Brother   . Diabetes Brother   . Lymphoma Brother   . Alzheimer's disease Brother   . Heart disease Mother   . Pancreatic cancer Sister   . Alzheimer's disease Sister   . Breast cancer Sister   . Leukemia Other   . Kidney disease Neg Hx     Allergies  Allergen Reactions  . Lovastatin Other (See Comments)    REACTION: leg  pain  . Statins Other (See Comments)    REACTION: leg cramps, weakness  . Sulfa Antibiotics Hives and Itching  . Codeine Rash  . Niacin Rash    Flushing also    Current Outpatient Prescriptions on File Prior to Visit  Medication Sig Dispense Refill  . ALPRAZolam (XANAX) 0.25 MG tablet TAKE ONE TABLET BY MOUTH TWICE DAILY AS NEEDED 20 tablet 0  . aspirin EC 325 MG tablet Take 1 tablet (325 mg total) by mouth 2 (two) times daily. (Patient taking differently: Take 325 mg by mouth daily. ) 30 tablet 0  . Cholecalciferol 1000 UNITS capsule Take 1,000 Units by mouth daily.    Marland Kitchen escitalopram (LEXAPRO) 20 MG tablet Take 1 tablet (20 mg total) by mouth daily. 30 tablet 11  . fenofibrate 54 MG tablet TAKE TWO TABLETS BY MOUTH DAILY 180 tablet 3  . sitaGLIPtin (JANUVIA) 25 MG tablet Take 1 tablet (25 mg total) by mouth daily. 30 tablet 11  . TIZANIDINE HCL PO Take by mouth as needed.      No current facility-administered medications on file prior to visit.    BP 144/84 mmHg  Pulse 74  Temp(Src) 97.5 F (36.4 C) (Oral)  Ht 5' 2.25" (1.581 m)  Wt 175 lb 12.8 oz (79.742 kg)  BMI 31.90 kg/m2  SpO2 98%    Objective:   Physical Exam  Constitutional: She appears well-nourished.  Cardiovascular: Normal rate and regular rhythm.   Pulmonary/Chest: Effort normal and breath sounds normal.  Abdominal: Soft. Bowel sounds are normal. There is no tenderness. There is CVA tenderness.  Skin: Skin is warm and dry.          Assessment & Plan:  Urinary retention:   Present with incontinence as well for the past 1-2 weeks, worse over the last several days. UA: Trace leuks, negative for nitrites, glucose. Culture sent. Given symptoms and history will cover with Keflex for a 7 day course. CBC also completed today as patient requested for evaluation of anemia. Results show that WBC elevated at 12. Encouraged her to follow up PRN if no improvement.

## 2015-06-24 NOTE — Assessment & Plan Note (Signed)
Improvement in CBC since hospitalization. Patient requested CBC today for re-evaluation due to [place]. Believe fatigue is related more to UTI today. Patient notified of results.

## 2015-06-24 NOTE — Patient Instructions (Signed)
Start Cephalexin antibiotics. Take 1 capsule by mouth twice daily for 7 days.  Complete lab work prior to leaving today. I will notify you of your results.  Continue to stay hydrated with water and notify me if your symptoms do no improve in the next 3-4 days.  It was a pleasure meeting you!

## 2015-06-26 LAB — URINE CULTURE: Colony Count: 50000

## 2015-07-03 ENCOUNTER — Other Ambulatory Visit: Payer: Self-pay | Admitting: Urology

## 2015-07-03 DIAGNOSIS — N2889 Other specified disorders of kidney and ureter: Secondary | ICD-10-CM

## 2015-07-03 NOTE — Telephone Encounter (Signed)
Spoke with Estill Bamberg and made aware of MRI. Estill Bamberg voiced understanding.

## 2015-07-03 NOTE — Telephone Encounter (Signed)
I spoke with Dr. Erlene Quan and she suggested the non contrast MRI.  So I will put the order in and we will get that scheduled.

## 2015-07-10 ENCOUNTER — Ambulatory Visit (HOSPITAL_COMMUNITY)
Admission: RE | Admit: 2015-07-10 | Discharge: 2015-07-10 | Disposition: A | Discharge status: Home / Self Care | Payer: Medicare PPO | Source: Ambulatory Visit | Attending: Urology | Admitting: Urology

## 2015-07-10 DIAGNOSIS — N281 Cyst of kidney, acquired: Secondary | ICD-10-CM | POA: Diagnosis not present

## 2015-07-10 DIAGNOSIS — N2889 Other specified disorders of kidney and ureter: Secondary | ICD-10-CM | POA: Diagnosis not present

## 2015-07-10 DIAGNOSIS — N289 Disorder of kidney and ureter, unspecified: Secondary | ICD-10-CM | POA: Diagnosis not present

## 2015-07-20 ENCOUNTER — Ambulatory Visit (INDEPENDENT_AMBULATORY_CARE_PROVIDER_SITE_OTHER): Payer: Medicare PPO | Admitting: Urology

## 2015-07-20 ENCOUNTER — Ambulatory Visit: Payer: Medicare PPO | Admitting: Urology

## 2015-07-20 ENCOUNTER — Encounter: Payer: Self-pay | Admitting: Urology

## 2015-07-20 VITALS — BP 144/79 | HR 87 | Ht 62.0 in | Wt 180.8 lb

## 2015-07-20 DIAGNOSIS — N390 Urinary tract infection, site not specified: Secondary | ICD-10-CM | POA: Diagnosis not present

## 2015-07-20 DIAGNOSIS — S37011D Minor contusion of right kidney, subsequent encounter: Secondary | ICD-10-CM

## 2015-07-20 DIAGNOSIS — R339 Retention of urine, unspecified: Secondary | ICD-10-CM | POA: Diagnosis not present

## 2015-07-20 LAB — MICROSCOPIC EXAMINATION: RBC MICROSCOPIC, UA: NONE SEEN /HPF (ref 0–?)

## 2015-07-20 LAB — URINALYSIS, COMPLETE
BILIRUBIN UA: NEGATIVE
Glucose, UA: NEGATIVE
Ketones, UA: NEGATIVE
NITRITE UA: NEGATIVE
PH UA: 5.5 (ref 5.0–7.5)
PROTEIN UA: NEGATIVE
Specific Gravity, UA: 1.02 (ref 1.005–1.030)
UUROB: 0.2 mg/dL (ref 0.2–1.0)

## 2015-07-20 LAB — BLADDER SCAN AMB NON-IMAGING

## 2015-07-20 MED ORDER — ESTRADIOL 0.1 MG/GM VA CREA: 1.0000 g | TOPICAL_CREAM | VAGINAL | Status: DC

## 2015-07-20 NOTE — Progress Notes (Signed)
07/20/2015 5:43 PM   Jill [name] Aye [birth year] [id number]  Referring provider: Jinny Sanders, MD [address] [place], [place] [address]  Chief Complaint  Patient presents with  . Results    HPI: 81 yo F with recurrent UTI/ history of renal abscess/ fluid collection/ hematoma.  MRI results reviewed today with patient, no underlying mass.  Fluid collection completely resolved.  She does have a history of recurrent urinary tract infections and has been treated multiple times over the past year. She does try to drink cranberry juice.  She reports today that she does at times have difficulty emptying her bladder. She has difficulty starting her stream and sometimes thinks that she may not be emptying all of the way.  She does not void very frequently throughout the day or at night and occasionally doesn't get up to use the bathroom even when she knows that she probably should. She does admit that she takes Benadryl on a fairly frequent basis for recurrent hives  She also occasionally takes Xanax..     No urinary frequency, urgency, or incontinence.   PMH: Past Medical History  Diagnosis Date  . Diabetes mellitus   . Hyperlipidemia   . Chronic kidney disease   . Depression   . Allergy   . Hypertension   . Vasovagal syncope 2006    Negative cardiac workup-myoview, echo  . Diverticula, colon   . Family history of anesthesia complication     Son is difficult intubation  . Complication of anesthesia     difficult waking   . Arthritis     osteoarthritis     Surgical History: Past Surgical History  Procedure Laterality Date  . Abdominal hysterectomy  1977    fibroid  . Joint replacement  2009    rt hip  . Total hip arthroplasty Left 05/12/2014    dr Mayer Camel  . Total hip arthroplasty Left 05/12/2014    Procedure: TOTAL HIP ARTHROPLASTY;  Surgeon: Kerin Salen, MD;  Location: [facility];  Service: Orthopedics;  Laterality: Left;    Home Medications:    Medication  List       This list is accurate as of: 07/20/15  5:43 PM.  Always use your most recent med list.               ALPRAZolam 0.25 MG tablet  Commonly known as:  XANAX  TAKE ONE TABLET BY MOUTH TWICE DAILY AS NEEDED     aspirin EC 325 MG tablet  Take 1 tablet (325 mg total) by mouth 2 (two) times daily.     Cholecalciferol 1000 UNITS capsule  Take 1,000 Units by mouth daily.     escitalopram 20 MG tablet  Commonly known as:  LEXAPRO  Take 1 tablet (20 mg total) by mouth daily.     estradiol 0.1 MG/GM vaginal cream  Commonly known as:  ESTRACE  Place [id number] Applicatorfuls vaginally 3 (three) times a week. Please pea size amount per urethra before bed M-W-Fr     fenofibrate 54 MG tablet  TAKE TWO TABLETS BY MOUTH DAILY     sitaGLIPtin 25 MG tablet  Commonly known as:  JANUVIA  Take 1 tablet (25 mg total) by mouth daily.     TIZANIDINE HCL PO  Take by mouth as needed.        Allergies:  Allergies  Allergen Reactions  . Lovastatin Other (See Comments)    REACTION: leg pain  . Statins Other (See  Comments)    REACTION: leg cramps, weakness  . Sulfa Antibiotics Hives and Itching  . Codeine Rash  . Niacin Rash    Flushing also    Family History: Family History  Problem Relation Age of Onset  . COPD Brother   . Heart disease Brother   . Diabetes Brother   . Lymphoma Brother   . Alzheimer's disease Brother   . Heart disease Mother   . Pancreatic cancer Sister   . Alzheimer's disease Sister   . Breast cancer Sister   . Leukemia Other   . Kidney disease Neg Hx     Social History:  reports that she quit smoking about 16 years ago. Her smoking use included Cigarettes. She has a 45 pack-year smoking history. She has never used smokeless tobacco. She reports that she does not drink alcohol or use illicit drugs.  ROS: UROLOGY Frequent Urination?: No Hard to postpone urination?: Yes Burning/pain with urination?: No Get up at night to urinate?: Yes Leakage of  urine?: No Urine stream starts and stops?: No Trouble starting stream?: Yes Do you have to strain to urinate?: No Blood in urine?: No Urinary tract infection?: No Sexually transmitted disease?: No Injury to kidneys or bladder?: No Painful intercourse?: No Weak stream?: No Currently pregnant?: No Vaginal bleeding?: No Last menstrual period?: n  Gastrointestinal Nausea?: No Vomiting?: No Indigestion/heartburn?: No Diarrhea?: No Constipation?: No  Constitutional Fever: No Night sweats?: No Weight loss?: No Fatigue?: Yes  Skin Skin rash/lesions?: No Itching?: No  Eyes Blurred vision?: No Double vision?: No  Ears/Nose/Throat Sore throat?: No Sinus problems?: No  Hematologic/Lymphatic Swollen glands?: No Easy bruising?: No  Cardiovascular Leg swelling?: No Chest pain?: No  Respiratory Cough?: No Shortness of breath?: No  Endocrine Excessive thirst?: No  Musculoskeletal Back pain?: No Joint pain?: No  Neurological Headaches?: No Dizziness?: No  Psychologic Depression?: No Anxiety?: No  Physical Exam: BP 144/79 mmHg  Pulse 87  Ht 5\' 2"  (1.575 m)  Wt 180 lb 12.8 oz (82.01 kg)  BMI 33.06 kg/m2  Constitutional:  Alert and oriented, No acute distress. Presents with daughter today. HEENT: [place] AT, moist mucus membranes.  Trachea midline, no masses. Cardiovascular: No clubbing, cyanosis, or edema. Respiratory: Normal respiratory effort, no increased work of breathing. GI: Abdomen is soft, nontender, nondistended, no abdominal masses GU: No CVA tenderness.  Skin: No rashes, bruises or suspicious lesions. Neurologic: Grossly intact, no focal deficits, moving all 4 extremities. Psychiatric: Normal mood and affect.  Laboratory Data: Lab Results  Component Value Date   WBC 12.2* 06/24/2015   HGB 11.2* 06/24/2015   HCT 35.3* 06/24/2015   MCV 84.8 06/24/2015   PLT 362.0 06/24/2015    Lab Results  Component Value Date   CREATININE 1.67* 05/26/2015     Lab Results  Component Value Date   HGBA1C 7.1* 05/26/2015    Urinalysis Results for orders placed or performed in visit on 07/20/15  Microscopic Examination  Result Value Ref Range   WBC, UA 11-30 (A) 0 -  5 /hpf   RBC, UA None seen 0 -  2 /hpf   Epithelial Cells (non renal) 0-10 0 - 10 /hpf   Casts Present (A) None seen /lpf   Cast Type Hyaline casts N/A   Bacteria, UA Few None seen/Few  Urinalysis, Complete  Result Value Ref Range   Specific Gravity, UA 1.020 1.005 - 1.030   pH, UA 5.5 5.0 - 7.5   Color, UA Yellow Yellow   Appearance  Ur Clear Clear   Leukocytes, UA 1+ (A) Negative   Protein, UA Negative Negative/Trace   Glucose, UA Negative Negative   Ketones, UA Negative Negative   RBC, UA Trace (A) Negative   Bilirubin, UA Negative Negative   Urobilinogen, Ur 0.2 0.2 - 1.0 mg/dL   Nitrite, UA Negative Negative   Microscopic Examination See below:   BLADDER SCAN AMB NON-IMAGING  Result Value Ref Range   Scan Result 12ml     Pertinent Imaging: CLINICAL DATA: 81 year old with right renal lesion. History of renal abscess and renal insufficiency. Subsequent encounter.  EXAM: MRI ABDOMEN WITHOUT CONTRAST  TECHNIQUE: Multiplanar multisequence MR imaging was performed without the administration of intravenous contrast. Contrast was not administered due to renal insufficiency.  COMPARISON: Ultrasound examinations ranging from 01/31/2015 through 05/12/2015. CT 01/27/2015.  FINDINGS: Lower chest: Unremarkable.  Hepatobiliary: There are probable tiny cysts within the posterior segment of the right hepatic lobe. No suspicious hepatic findings. No significant biliary dilatation. The gallbladder appears normal without wall thickening or stones.  Pancreas: Mildly atrophied without focal abnormality or surrounding inflammation.  Spleen: Normal in size without focal abnormality.  Adrenals/Urinary Tract: Both adrenal glands appear normal.  Both kidneys demonstrate cortical scarring, cortical lobularity and small cysts. There is no residual subcapsular or perinephric fluid collection on the right. There is no suspicious parenchymal lesion on noncontrast imaging. There is no hydronephrosis.  Stomach/Bowel: No evidence of bowel wall thickening, distention or surrounding inflammatory change.Distal colonic diverticular changes noted.  Vascular/Lymphatic: There are no enlarged abdominal lymph nodes. Aortoiliac atherosclerosis better demonstrated on CT.  Other: None.  Musculoskeletal: No acute or significant osseous findings. Multilevel lumbar spondylosis noted.  IMPRESSION: 1. No significant residual perinephric/subcapsular fluid collection on the right. 2. Both kidneys demonstrate cortical thinning and small cysts, but no suspicious lesions. 3. No acute findings.   Electronically Signed  By: Richardean Sale M.D.  On: 07/10/2015 16:49  Assessment & Plan:    1. Renal hematoma, right, subsequent encounter Resolved, no underlying mass.  MRI reviewed with patient.  2. Incomplete bladder emptying Postvoid residual today minimal. I have asked her to avoid Benadryl as much as possible as this could be contributing to her symptoms of occasional difficulty voiding. We also discussed behavioral modification today and recommended double voiding/time voiding every 4 hours. - BLADDER SCAN AMB NON-IMAGING - Urinalysis, Complete  3. Recurrent UTI Behavioral modification as above. Also discussed trial of estrogen cream. We discussed risks of this medication and how to apply (Pea size amount per urethra Monday/Wednesday/Friday)   Return in about 6 months (around 01/17/2016) for [facility] recurrent UTI f/u.  Hollice Espy, MD  [facility] [address] [place], [place] [address] [phone]

## 2015-08-06 ENCOUNTER — Encounter: Payer: Self-pay | Admitting: Family Medicine

## 2015-08-06 NOTE — Telephone Encounter (Signed)
She is on the max dose of lexapro

## 2015-08-19 DIAGNOSIS — N2889 Other specified disorders of kidney and ureter: Secondary | ICD-10-CM | POA: Diagnosis not present

## 2015-08-19 DIAGNOSIS — E1122 Type 2 diabetes mellitus with diabetic chronic kidney disease: Secondary | ICD-10-CM | POA: Diagnosis not present

## 2015-08-19 DIAGNOSIS — N183 Chronic kidney disease, stage 3 (moderate): Secondary | ICD-10-CM | POA: Diagnosis not present

## 2015-08-19 DIAGNOSIS — E1129 Type 2 diabetes mellitus with other diabetic kidney complication: Secondary | ICD-10-CM | POA: Diagnosis not present

## 2015-08-21 ENCOUNTER — Ambulatory Visit (INDEPENDENT_AMBULATORY_CARE_PROVIDER_SITE_OTHER): Payer: Medicare PPO | Admitting: Family Medicine

## 2015-08-21 ENCOUNTER — Ambulatory Visit: Payer: Self-pay | Admitting: Family Medicine

## 2015-08-21 ENCOUNTER — Encounter: Payer: Self-pay | Admitting: Family Medicine

## 2015-08-21 VITALS — BP 120/69 | HR 75 | Temp 97.6°F | Ht 62.0 in | Wt 182.5 lb

## 2015-08-21 DIAGNOSIS — R5382 Chronic fatigue, unspecified: Secondary | ICD-10-CM | POA: Diagnosis not present

## 2015-08-21 DIAGNOSIS — F3341 Major depressive disorder, recurrent, in partial remission: Secondary | ICD-10-CM | POA: Diagnosis not present

## 2015-08-21 DIAGNOSIS — R351 Nocturia: Secondary | ICD-10-CM

## 2015-08-21 DIAGNOSIS — R531 Weakness: Secondary | ICD-10-CM

## 2015-08-21 DIAGNOSIS — E785 Hyperlipidemia, unspecified: Secondary | ICD-10-CM | POA: Diagnosis not present

## 2015-08-21 DIAGNOSIS — R5383 Other fatigue: Secondary | ICD-10-CM

## 2015-08-21 MED ORDER — OXYBUTYNIN CHLORIDE 5 MG PO TABS: 5.0000 mg | ORAL_TABLET | Freq: Every day | ORAL | Status: DC

## 2015-08-21 NOTE — Assessment & Plan Note (Signed)
Likely multifactorial.. Deconditioning age, poor sleep, ,mood , med SE.  pt was given home strengthening exercise info packet to start home PT. She refused referral to formal PT. Will re-eval with labs at upcoming OV given history of vit Def and eval thyroid. Will treat urge incontinence waking her at night as below.

## 2015-08-21 NOTE — Patient Instructions (Addendum)
Start home strengthening exercise. Start nighttime urinary frequency medication at bedtime.

## 2015-08-21 NOTE — Assessment & Plan Note (Signed)
Will treat urge symptoms with oxybutynin at bedtime ( no daytime symptoms so will just use once daily to avoid any SE.  Avoid drinking fluids late in day.

## 2015-08-21 NOTE — Progress Notes (Signed)
Subjective:    Patient ID: Jill [name], female    DOB: 03/22/1932, 81 y.o.   MRN: [id number]  HPI  81 year old female presenst to discuss depression and fatigue.  She has been very tired lately. No " get up and go anymore" Ongoing for 1 year, not worse just not getting better. She feels she has felt weak and fatigued in last 6 months. Feels like muscle tired and weak.. Got to sit down. No SOB. She sleeps well at night except she does get up 3 times a night. Some trouble falling a sleep. Does not feel rested at night. No snoring at night, no apnea spells. Always feel cold. No blood loss.   Depression, major, single episode on  lexapro 20 mg daily, no improvement on this dose. She lives by herself.  She does get lonely at times.   Her husband  died on parkinson's in 12-30-2013.  BP Readings from Last 3 Encounters:  08/21/15 120/69  07/20/15 144/79  06/24/15 144/84    She did see Allie Bossier on 11/1: Hg 11.2  Social History /Family History/Past Medical History reviewed and updated if needed. Hx of    Review of Systems  Constitutional: Positive for fatigue. Negative for fever.  HENT: Negative for ear pain.   Eyes: Negative for pain.  Respiratory: Negative for chest tightness and shortness of breath.   Cardiovascular: Negative for chest pain, palpitations and leg swelling.  Gastrointestinal: Negative for abdominal pain.  Genitourinary: Negative for dysuria.  Neurological: Positive for weakness.       Objective:   Physical Exam  Constitutional: She is oriented to person, place, and time. Vital signs are normal. She appears well-developed and well-nourished. She is cooperative.  Non-toxic appearance. She does not appear ill. No distress.  HENT:  Head: Normocephalic.  Right Ear: Hearing, tympanic membrane, external ear and ear canal normal. Tympanic membrane is not erythematous, not retracted and not bulging.  Left Ear: Hearing, tympanic membrane, external ear and ear canal  normal. Tympanic membrane is not erythematous, not retracted and not bulging.  Nose: No mucosal edema or rhinorrhea. Right sinus exhibits no maxillary sinus tenderness and no frontal sinus tenderness. Left sinus exhibits no maxillary sinus tenderness and no frontal sinus tenderness.  Mouth/Throat: Uvula is midline, oropharynx is clear and moist and mucous membranes are normal.  Eyes: Conjunctivae, EOM and lids are normal. Pupils are equal, round, and reactive to light. Lids are everted and swept, no foreign bodies found.  Neck: Trachea normal and normal range of motion. Neck supple. Carotid bruit is not present. No thyroid mass and no thyromegaly present.  Cardiovascular: Normal rate, regular rhythm, S1 normal, S2 normal, normal heart sounds, intact distal pulses and normal pulses.  Exam reveals no gallop and no friction rub.   No murmur heard. Pulmonary/Chest: Effort normal and breath sounds normal. No tachypnea. No respiratory distress. She has no decreased breath sounds. She has no wheezes. She has no rhonchi. She has no rales.  Abdominal: Soft. Normal appearance and bowel sounds are normal. There is no tenderness.  Neurological: She is alert and oriented to person, place, and time. She has normal strength and normal reflexes. No cranial nerve deficit or sensory deficit. She exhibits normal muscle tone. She displays a negative Romberg sign. Coordination and gait normal. GCS eye subscore is 4. GCS verbal subscore is 5. GCS motor subscore is 6.  Nml cerebellar exam   No papilledema  Skin: Skin is warm, dry and intact.  No rash noted.  Psychiatric: She has a normal mood and affect. Her speech is normal and behavior is normal. Judgment and thought content normal. Her mood appears not anxious. Cognition and memory are normal. Cognition and memory are not impaired. She does not exhibit a depressed mood. She exhibits normal recent memory and normal remote memory.          Assessment & Plan:

## 2015-08-21 NOTE — Assessment & Plan Note (Signed)
PHQ9 Showed a score of 8. Pt reports that she feels it is fairly well controlled on a medication. Not much different with the change to lexapro. She does not feel that this is the cause of her fatigue and weakness.

## 2015-08-21 NOTE — Progress Notes (Signed)
Pre visit review using our clinic review tool, if applicable. No additional management support is needed unless otherwise documented below in the visit note. 

## 2015-08-21 NOTE — Assessment & Plan Note (Signed)
No sign of specific muscle weakness or neuro change.

## 2015-08-25 ENCOUNTER — Telehealth: Payer: Self-pay | Admitting: Family Medicine

## 2015-08-25 NOTE — Telephone Encounter (Signed)
Labs already entered.

## 2015-08-25 NOTE — Telephone Encounter (Signed)
-----   Message from Ellamae Sia sent at 08/19/2015  3:49 PM EST ----- Regarding: Lab orders for Wednesday, 1.4.17 Patient is scheduled for CPX labs, please order future labs, Thanks , Karna Christmas

## 2015-08-26 ENCOUNTER — Other Ambulatory Visit (INDEPENDENT_AMBULATORY_CARE_PROVIDER_SITE_OTHER): Payer: Medicare Other

## 2015-08-26 DIAGNOSIS — E559 Vitamin D deficiency, unspecified: Secondary | ICD-10-CM | POA: Diagnosis not present

## 2015-08-26 DIAGNOSIS — R5383 Other fatigue: Secondary | ICD-10-CM

## 2015-08-26 DIAGNOSIS — Z79899 Other long term (current) drug therapy: Secondary | ICD-10-CM | POA: Diagnosis not present

## 2015-08-26 DIAGNOSIS — E785 Hyperlipidemia, unspecified: Secondary | ICD-10-CM | POA: Diagnosis not present

## 2015-08-26 LAB — T3, FREE: T3, Free: 3 pg/mL (ref 2.3–4.2)

## 2015-08-26 LAB — LIPID PANEL
CHOLESTEROL: 209 mg/dL — AB (ref 0–200)
HDL: 58.8 mg/dL (ref >39.00)
LDL CALC: 127 mg/dL — AB (ref 0–99)
NonHDL: 149.94
TRIGLYCERIDES: 116 mg/dL (ref 0.0–149.0)
Total CHOL/HDL Ratio: 4
VLDL: 23.2 mg/dL (ref 0.0–40.0)

## 2015-08-26 LAB — COMPREHENSIVE METABOLIC PANEL
ALBUMIN: 3.5 g/dL (ref 3.5–5.2)
ALT: 8 U/L (ref 0–35)
AST: 15 U/L (ref 0–37)
Alkaline Phosphatase: 52 U/L (ref 39–117)
BUN: 32 mg/dL — ABNORMAL HIGH (ref 6–23)
CALCIUM: 9.4 mg/dL (ref 8.4–10.5)
CHLORIDE: 106 meq/L (ref 96–112)
CO2: 28 mEq/L (ref 19–32)
Creatinine, Ser: 1.81 mg/dL — ABNORMAL HIGH (ref 0.40–1.20)
GFR: 28.48 mL/min — ABNORMAL LOW (ref >60.00)
Glucose, Bld: 131 mg/dL — ABNORMAL HIGH (ref 70–99)
POTASSIUM: 4.7 meq/L (ref 3.5–5.1)
SODIUM: 141 meq/L (ref 135–145)
Total Bilirubin: 0.4 mg/dL (ref 0.2–1.2)
Total Protein: 6.4 g/dL (ref 6.0–8.3)

## 2015-08-26 LAB — TSH: TSH: 2.56 u[IU]/mL (ref 0.35–4.50)

## 2015-08-26 LAB — VITAMIN D 25 HYDROXY (VIT D DEFICIENCY, FRACTURES): VITD: 20.68 ng/mL — ABNORMAL LOW (ref 30.00–100.00)

## 2015-08-26 LAB — T4, FREE: FREE T4: 0.87 ng/dL (ref 0.60–1.60)

## 2015-08-26 LAB — VITAMIN B12: Vitamin B-12: 886 pg/mL (ref 211–911)

## 2015-09-03 ENCOUNTER — Ambulatory Visit (INDEPENDENT_AMBULATORY_CARE_PROVIDER_SITE_OTHER): Payer: Medicare Other | Admitting: Family Medicine

## 2015-09-03 ENCOUNTER — Encounter: Payer: Self-pay | Admitting: Family Medicine

## 2015-09-03 VITALS — BP 130/70 | HR 72 | Temp 97.4°F | Ht 62.25 in | Wt 181.0 lb

## 2015-09-03 DIAGNOSIS — R351 Nocturia: Secondary | ICD-10-CM | POA: Diagnosis not present

## 2015-09-03 DIAGNOSIS — E119 Type 2 diabetes mellitus without complications: Secondary | ICD-10-CM | POA: Diagnosis not present

## 2015-09-03 DIAGNOSIS — R531 Weakness: Secondary | ICD-10-CM

## 2015-09-03 DIAGNOSIS — E559 Vitamin D deficiency, unspecified: Secondary | ICD-10-CM

## 2015-09-03 DIAGNOSIS — E785 Hyperlipidemia, unspecified: Secondary | ICD-10-CM

## 2015-09-03 NOTE — Assessment & Plan Note (Signed)
Inadequate control. Now feeing better and more motivated to move more and work on [employer].

## 2015-09-03 NOTE — Progress Notes (Signed)
Subjective:    Patient ID: Jill [name], female    DOB: 16-Nov-1931, 82 y.o.   MRN: [id number]  HPI  82 year old female presents for follow up  on multiple issues  1. Nocturia: oxybutynin at bedtime. She reports that she has had major improvement in  Nopcturia, only once a night down from 3-4 a night. NO SE the next day other dry mouth.  2.Chronic fatigue, lab eval showed  nml thyroid, nml vit b12, low vit D She has significantly more energy now during the day.  She more like herself again.  3. CKD: GFR 28 Followed by kidney doctor.  4. Elevated Cholesterol:  Not at goal. LDL goal < 100 Lab Results  Component Value Date   CHOL 209* 08/26/2015   HDL 58.80 08/26/2015   LDLCALC 127* 08/26/2015   LDLDIRECT 137.6 11/10/2008   TRIG 116.0 08/26/2015   CHOLHDL 4 08/26/2015  Using medications without problems: Muscle aches:  Diet compliance: moderate Exercise:  Walking more now. Other complaints:   5. Diabetes:  A1C was left out of labs. She is taking [place] daily low dose. Using medications without difficulties: Hypoglycemic episodes:none Hyperglycemic episodes:none Feet problems: none Blood Sugars averaging: FBS 130-150 eye exam within last year:   Review of Systems  Constitutional: Negative for fever and fatigue.  HENT: Negative for ear pain.   Eyes: Negative for pain.  Respiratory: Negative for chest tightness and shortness of breath.   Cardiovascular: Negative for chest pain, palpitations and leg swelling.  Gastrointestinal: Negative for abdominal pain.  Genitourinary: Negative for dysuria.       Objective:   Physical Exam  Constitutional: Vital signs are normal. She appears well-developed and well-nourished. She is cooperative.  Non-toxic appearance. She does not appear ill. No distress.  HENT:  Head: Normocephalic.  Right Ear: Hearing, tympanic membrane, external ear and ear canal normal. Tympanic membrane is not erythematous, not retracted and not bulging.   Left Ear: Hearing, tympanic membrane, external ear and ear canal normal. Tympanic membrane is not erythematous, not retracted and not bulging.  Nose: No mucosal edema or rhinorrhea. Right sinus exhibits no maxillary sinus tenderness and no frontal sinus tenderness. Left sinus exhibits no maxillary sinus tenderness and no frontal sinus tenderness.  Mouth/Throat: Uvula is midline, oropharynx is clear and moist and mucous membranes are normal.  Eyes: Conjunctivae, EOM and lids are normal. Pupils are equal, round, and reactive to light. Lids are everted and swept, no foreign bodies found.  Neck: Trachea normal and normal range of motion. Neck supple. Carotid bruit is not present. No thyroid mass and no thyromegaly present.  Cardiovascular: Normal rate, regular rhythm, S1 normal, S2 normal, normal heart sounds, intact distal pulses and normal pulses.  Exam reveals no gallop and no friction rub.   No murmur heard. Pulmonary/Chest: Effort normal and breath sounds normal. No tachypnea. No respiratory distress. She has no decreased breath sounds. She has no wheezes. She has no rhonchi. She has no rales.  Abdominal: Soft. Normal appearance and bowel sounds are normal. There is no tenderness.  Neurological: She is alert.  Skin: Skin is warm, dry and intact. No rash noted.  Psychiatric: Her speech is normal and behavior is normal. Judgment and thought content normal. Her mood appears not anxious. Cognition and memory are normal. She does not exhibit a depressed mood.   Diabetic foot exam: Normal inspection No skin breakdown No calluses  Normal DP pulses Normal sensation to light touch and monofilament Nails thickened  and discolored.       Assessment & Plan:

## 2015-09-03 NOTE — Assessment & Plan Note (Signed)
Resolved with better sleep

## 2015-09-03 NOTE — Progress Notes (Signed)
Pre visit review using our clinic review tool, if applicable. No additional management support is needed unless otherwise documented below in the visit note. 

## 2015-09-03 NOTE — Assessment & Plan Note (Signed)
Inadequate control on no med. Encouraged exercise, weight loss, healthy eating habits.

## 2015-09-03 NOTE — Assessment & Plan Note (Signed)
Improved dramatically with oxybutynin.

## 2015-09-03 NOTE — Assessment & Plan Note (Signed)
Replete

## 2015-09-03 NOTE — Patient Instructions (Addendum)
Start vit D3 supplement 400 IU twice daily to increase vit D. Work on low cholesterol diet and lower sugar diet.  Incease exercise as able.

## 2015-09-21 ENCOUNTER — Encounter: Payer: Self-pay | Admitting: Family Medicine

## 2015-09-22 ENCOUNTER — Other Ambulatory Visit: Payer: Self-pay | Admitting: Family Medicine

## 2015-09-22 MED ORDER — PIOGLITAZONE HCL 30 MG PO TABS: 30.0000 mg | ORAL_TABLET | Freq: Every day | ORAL | Status: DC

## 2015-10-01 ENCOUNTER — Other Ambulatory Visit: Payer: Self-pay | Admitting: Family Medicine

## 2015-10-12 ENCOUNTER — Ambulatory Visit: Payer: Medicare PPO | Admitting: Urology

## 2015-10-13 ENCOUNTER — Encounter: Payer: Self-pay | Admitting: Urology

## 2015-10-13 ENCOUNTER — Ambulatory Visit (INDEPENDENT_AMBULATORY_CARE_PROVIDER_SITE_OTHER): Payer: Medicare Other | Admitting: Urology

## 2015-10-13 ENCOUNTER — Ambulatory Visit: Payer: Self-pay | Admitting: Family Medicine

## 2015-10-13 VITALS — BP 166/82 | HR 80 | Ht 62.0 in | Wt 188.4 lb

## 2015-10-13 DIAGNOSIS — N390 Urinary tract infection, site not specified: Secondary | ICD-10-CM

## 2015-10-13 DIAGNOSIS — N151 Renal and perinephric abscess: Secondary | ICD-10-CM

## 2015-10-13 DIAGNOSIS — R339 Retention of urine, unspecified: Secondary | ICD-10-CM

## 2015-10-13 LAB — MICROSCOPIC EXAMINATION: Bacteria, UA: NONE SEEN

## 2015-10-13 LAB — URINALYSIS, COMPLETE
Bilirubin, UA: NEGATIVE
GLUCOSE, UA: NEGATIVE
Ketones, UA: NEGATIVE
NITRITE UA: NEGATIVE
PH UA: 5.5 (ref 5.0–7.5)
PROTEIN UA: NEGATIVE
Specific Gravity, UA: 1.015 (ref 1.005–1.030)
Urobilinogen, Ur: 0.2 mg/dL (ref 0.2–1.0)

## 2015-10-13 LAB — BLADDER SCAN AMB NON-IMAGING: SCAN RESULT: 123

## 2015-10-13 NOTE — Progress Notes (Signed)
12:25 PM   Jill [name] Apr 27, 1932 [id number]  Referring provider: Jinny Sanders, MD [address] [place], [place] [address]  Chief Complaint  Patient presents with  . Urinary Tract Infection    patient thinks she has an uti    HPI: Patient is an 82 year old Caucasian female who has a history of a renal abscess who is been experiencing left hip pain for the last 2 weeks and is fearful the renal abscess has returned.  Previous history Patient presented to the ED on 01/26/2015 c/o fevers, chills, nausea, vomiting and right flank pain. Urine dip was nitrate positive with 36 white blood cells and many bacteria. She was given IV Rocephin in the emergency room. A noncontrast CT was performed and findings were suspicious for renal abscess in the right kidney. She was then admitted to the hospital and IV Rocephin was continued and then transitioned to [place]. Her urine culture grew out pansensitive Escherichia coli and she was discharged on oral Cipro. She then returned back to the emergency room on June 14 with a complaint of shortness of breath. She was treated for bronchitis and discharged to home. She then reported back to the emergency room on 02/16/2015 with a complaint of urinary retention. Patient's daughter-in-law stated that she was only dribbling urine 2 days prior to her visit to the emergency room. The daughter-in-law also stated the patient did not consume a lot of liquids in the days prior to the emergency room visit. The patient did not have suprapubic pain or discomfort with her urine dribbling. She did have a Foley catheter placement 241 mL of urine returned.  She does not recall specific event that would've caused left-sided hip pain.  The left-sided hip pain is dependent upon movement.  She has not experienced fevers, chills, nausea or vomiting.  She is not experiencing dysuria, gross hematuria or suprapubic pain.  Her UA today is unremarkable.  Her PVR is 123 mL.      An MRI done in 06/2015 noted that the renal abscess resolved.    She does have a history of recurrent urinary tract infections and has been treated multiple times over the past year. She does try to drink cranberry juice.  She reports  that she does at times have difficulty emptying her bladder. She has difficulty starting her stream and sometimes thinks that she may not be emptying all of the way.  Her daughter states that she has been having frequent urination during the night and very little urination during the day.     PMH: Past Medical History  Diagnosis Date  . Diabetes mellitus   . Hyperlipidemia   . Chronic kidney disease   . Depression   . Allergy   . Hypertension   . Vasovagal syncope 2006    Negative cardiac workup-myoview, echo  . Diverticula, colon   . Family history of anesthesia complication     Son is difficult intubation  . Complication of anesthesia     difficult waking   . Arthritis     osteoarthritis     Surgical History: Past Surgical History  Procedure Laterality Date  . Abdominal hysterectomy  1977    fibroid  . Joint replacement  2009    rt hip  . Total hip arthroplasty Left 05/12/2014    dr Mayer Camel  . Total hip arthroplasty Left 05/12/2014    Procedure: TOTAL HIP ARTHROPLASTY;  Surgeon: Kerin Salen, MD;  Location: [facility];  Service:  Orthopedics;  Laterality: Left;    Home Medications:    Medication List       This list is accurate as of: 10/13/15 12:25 PM.  Always use your most recent med list.               ALPRAZolam 0.25 MG tablet  Commonly known as:  XANAX  TAKE ONE TABLET BY MOUTH TWICE DAILY AS NEEDED     aspirin EC 325 MG tablet  Take 1 tablet (325 mg total) by mouth 2 (two) times daily.     Cholecalciferol 1000 units capsule  Take 1,000 Units by mouth daily.     escitalopram 20 MG tablet  Commonly known as:  LEXAPRO  Take 1 tablet (20 mg total) by mouth daily.     estradiol 0.1 MG/GM vaginal cream  Commonly known as:   ESTRACE  Place [id number] Applicatorfuls vaginally 3 (three) times a week. Please pea size amount per urethra before bed M-W-Fr     fenofibrate 54 MG tablet  TAKE TWO TABLETS BY MOUTH DAILY     oxybutynin 5 MG tablet  Commonly known as:  DITROPAN  TAKE ONE TABLET BY MOUTH EVERY NIGHT AT BEDTIME     pioglitazone 30 MG tablet  Commonly known as:  ACTOS  Take 1 tablet (30 mg total) by mouth daily.     TIZANIDINE HCL PO  Take by mouth as needed. Reported on 10/13/2015        Allergies:  Allergies  Allergen Reactions  . Lovastatin Other (See Comments)    REACTION: leg pain  . Statins Other (See Comments)    REACTION: leg cramps, weakness  . Sulfa Antibiotics Hives and Itching  . Codeine Rash  . Niacin Rash    Flushing also    Family History: Family History  Problem Relation Age of Onset  . COPD Brother   . Heart disease Brother   . Diabetes Brother   . Lymphoma Brother   . Alzheimer's disease Brother   . Heart disease Mother   . Pancreatic cancer Sister   . Alzheimer's disease Sister   . Breast cancer Sister   . Leukemia Other   . Kidney disease Neg Hx     Social History:  reports that she quit smoking about 16 years ago. Her smoking use included Cigarettes. She has a 45 pack-year smoking history. She has never used smokeless tobacco. She reports that she does not drink alcohol or use illicit drugs.  ROS: UROLOGY Frequent Urination?: No Hard to postpone urination?: Yes Burning/pain with urination?: No Get up at night to urinate?: Yes Leakage of urine?: Yes Urine stream starts and stops?: No Trouble starting stream?: No Do you have to strain to urinate?: No Blood in urine?: No Urinary tract infection?: Yes Sexually transmitted disease?: No Injury to kidneys or bladder?: No Painful intercourse?: No Weak stream?: Yes Currently pregnant?: No Vaginal bleeding?: No Last menstrual period?: n  Gastrointestinal Nausea?: No Vomiting?: No Indigestion/heartburn?:  No Diarrhea?: No Constipation?: No  Constitutional Fever: No Night sweats?: No Weight loss?: No Fatigue?: Yes  Skin Skin rash/lesions?: No Itching?: No  Eyes Blurred vision?: No Double vision?: No  Ears/Nose/Throat Sore throat?: No Sinus problems?: Yes  Hematologic/Lymphatic Swollen glands?: No Easy bruising?: Yes  Cardiovascular Leg swelling?: Yes Chest pain?: No  Respiratory Cough?: No Shortness of breath?: No  Endocrine Excessive thirst?: Yes  Musculoskeletal Back pain?: Yes Joint pain?: Yes  Neurological Headaches?: No Dizziness?: No  Psychologic Depression?: No Anxiety?: No  Physical Exam: BP 166/82 mmHg  Pulse 80  Ht 5\' 2"  (1.575 m)  Wt 188 lb 6.4 oz (85.458 kg)  BMI 34.45 kg/m2  Constitutional:  Alert and oriented, No acute distress. Presents with daughter today. HEENT: [place] AT, moist mucus membranes.  Trachea midline, no masses. Cardiovascular: No clubbing, cyanosis, or edema. Respiratory: Normal respiratory effort, no increased work of breathing. GI: Abdomen is soft, nontender, nondistended, no abdominal masses GU: No CVA tenderness.  Skin: No rashes, bruises or suspicious lesions. Neurologic: Grossly intact, no focal deficits, moving all 4 extremities. Psychiatric: Normal mood and affect.  Laboratory Data: Lab Results  Component Value Date   WBC 12.2* 06/24/2015   HGB 11.2* 06/24/2015   HCT 35.3* 06/24/2015   MCV 84.8 06/24/2015   PLT 362.0 06/24/2015    Lab Results  Component Value Date   CREATININE 1.81* 08/26/2015    Lab Results  Component Value Date   HGBA1C 7.1* 05/26/2015    Urinalysis Microscopic Examination  Result Value Ref Range   WBC, UA 11-30 (A) 0 -  5 /hpf   RBC, UA 0-2 0 -  2 /hpf   Epithelial Cells (non renal) 0-10 0 - 10 /hpf   Bacteria, UA None seen None seen/Few  Urinalysis, Complete  Result Value Ref Range   Specific Gravity, UA 1.015 1.005 - 1.030   pH, UA 5.5 5.0 - 7.5   Color, UA Yellow  Yellow   Appearance Ur Clear Clear   Leukocytes, UA 1+ (A) Negative   Protein, UA Negative Negative/Trace   Glucose, UA Negative Negative   Ketones, UA Negative Negative   RBC, UA Trace (A) Negative   Bilirubin, UA Negative Negative   Urobilinogen, Ur 0.2 0.2 - 1.0 mg/dL   Nitrite, UA Negative Negative   Microscopic Examination See below:      Pertinent Imaging: CLINICAL DATA: 82 year old with right renal lesion. History of renal abscess and renal insufficiency. Subsequent encounter.  EXAM: MRI ABDOMEN WITHOUT CONTRAST  TECHNIQUE: Multiplanar multisequence MR imaging was performed without the administration of intravenous contrast. Contrast was not administered due to renal insufficiency.  COMPARISON: Ultrasound examinations ranging from 01/31/2015 through 05/12/2015. CT 01/27/2015.  FINDINGS: Lower chest: Unremarkable.  Hepatobiliary: There are probable tiny cysts within the posterior segment of the right hepatic lobe. No suspicious hepatic findings. No significant biliary dilatation. The gallbladder appears normal without wall thickening or stones.  Pancreas: Mildly atrophied without focal abnormality or surrounding inflammation.  Spleen: Normal in size without focal abnormality.  Adrenals/Urinary Tract: Both adrenal glands appear normal. Both kidneys demonstrate cortical scarring, cortical lobularity and small cysts. There is no residual subcapsular or perinephric fluid collection on the right. There is no suspicious parenchymal lesion on noncontrast imaging. There is no hydronephrosis.  Stomach/Bowel: No evidence of bowel wall thickening, distention or surrounding inflammatory change.Distal colonic diverticular changes noted.  Vascular/Lymphatic: There are no enlarged abdominal lymph nodes. Aortoiliac atherosclerosis better demonstrated on CT.  Other: None.  Musculoskeletal: No acute or significant osseous findings. Multilevel lumbar  spondylosis noted.  IMPRESSION: 1. No significant residual perinephric/subcapsular fluid collection on the right. 2. Both kidneys demonstrate cortical thinning and small cysts, but no suspicious lesions. 3. No acute findings.   Electronically Signed  By: Richardean Sale M.D.  On: 07/10/2015 16:49  Assessment & Plan:    1. Renal hematoma:   Resolved, no underlying mass.  MRI confirmed resolution on 07/10/2015.  I reassurred the patient that she does not have another abcess.  Her abscess was  in the right kidney and her pain is in the left hip.    2. Incomplete bladder emptying:   Her PVR is somewhat elevated today.  Her daughter reports that she has not been urinating large amounts of urine and her legs are swelling.  I will send her home with a hat to measure urine output and the daughter will call with the volumes.  - BLADDER SCAN AMB NON-IMAGING - Urinalysis, Complete  3. Recurrent UTI:   UA had pus in it  today.   I will send it for culture in case she develops symptoms in the furture.  Behavioral modification as above. Also discussed trial of estrogen cream. We discussed risks of this medication and how to apply (Pea size amount per urethra Monday/Wednesday/Friday)   Return for pending labs.  Zara Council, [facility] [address] [place], [place] [address] [phone]

## 2015-10-14 ENCOUNTER — Telehealth: Payer: Self-pay | Admitting: Urology

## 2015-10-14 NOTE — Telephone Encounter (Signed)
Patient's daughter just called back and she said she has been producing 4oz at the most since she got up this morning.   michelle

## 2015-10-14 NOTE — Telephone Encounter (Signed)
Patient's called and said that she has been producing 57oz and that was done mostly during the night last night.  She has not produced a lot during the day today and they said this is a total for the day and the night. Does she need to keep doing it?  Jill [name]

## 2015-10-14 NOTE — Telephone Encounter (Signed)
It looks like patient's urinary output is mostly at night. This is usually a sign of sleep apnea. I suggest she speak with her primary care physician to arrange for a sleep study to rule out apnea.

## 2015-10-15 DIAGNOSIS — N151 Renal and perinephric abscess: Secondary | ICD-10-CM | POA: Insufficient documentation

## 2015-10-15 DIAGNOSIS — R339 Retention of urine, unspecified: Secondary | ICD-10-CM | POA: Insufficient documentation

## 2015-10-15 DIAGNOSIS — N390 Urinary tract infection, site not specified: Secondary | ICD-10-CM | POA: Insufficient documentation

## 2015-10-16 NOTE — Telephone Encounter (Signed)
Spoke with pt daughter in law, Estill Bamberg, in reference to urine out. Made aware Larene Beach suggest seeing PCP for possible sleep study. Estill Bamberg voiced understanding stating she would call PCP today.

## 2015-10-18 LAB — CULTURE, URINE COMPREHENSIVE

## 2015-10-20 ENCOUNTER — Telehealth: Payer: Self-pay

## 2015-10-20 NOTE — Telephone Encounter (Signed)
-----   Message from Nori Riis, PA-C sent at 10/19/2015  8:16 AM EST ----- Please tell the patient that she does not have an UTI.

## 2015-10-20 NOTE — Telephone Encounter (Signed)
Spoke with pt daughter in law, Estill Bamberg, in reference to -ucx. Estill Bamberg voiced understanding.

## 2015-11-29 ENCOUNTER — Other Ambulatory Visit: Payer: Self-pay | Admitting: Family Medicine

## 2015-11-30 NOTE — Telephone Encounter (Signed)
Last office visit 09/03/2015.  Last refilled 06/18/2015 for #20 with no refills. Ok to refill?

## 2015-12-01 NOTE — Telephone Encounter (Signed)
Alprazolam called into [facility]. 

## 2015-12-08 ENCOUNTER — Ambulatory Visit (INDEPENDENT_AMBULATORY_CARE_PROVIDER_SITE_OTHER): Payer: Medicare Other | Admitting: Family Medicine

## 2015-12-08 ENCOUNTER — Telehealth: Payer: Self-pay

## 2015-12-08 ENCOUNTER — Encounter: Payer: Self-pay | Admitting: Family Medicine

## 2015-12-08 VITALS — BP 139/66 | HR 78 | Temp 98.4°F | Ht 62.0 in | Wt 189.0 lb

## 2015-12-08 DIAGNOSIS — N390 Urinary tract infection, site not specified: Secondary | ICD-10-CM

## 2015-12-08 DIAGNOSIS — E119 Type 2 diabetes mellitus without complications: Secondary | ICD-10-CM

## 2015-12-08 DIAGNOSIS — R39198 Other difficulties with micturition: Secondary | ICD-10-CM | POA: Diagnosis not present

## 2015-12-08 DIAGNOSIS — R0609 Other forms of dyspnea: Secondary | ICD-10-CM

## 2015-12-08 DIAGNOSIS — R609 Edema, unspecified: Secondary | ICD-10-CM | POA: Diagnosis not present

## 2015-12-08 DIAGNOSIS — N183 Chronic kidney disease, stage 3 unspecified: Secondary | ICD-10-CM

## 2015-12-08 DIAGNOSIS — R600 Localized edema: Secondary | ICD-10-CM

## 2015-12-08 DIAGNOSIS — R339 Retention of urine, unspecified: Secondary | ICD-10-CM

## 2015-12-08 LAB — POC URINALSYSI DIPSTICK (AUTOMATED)
Bilirubin, UA: NEGATIVE
GLUCOSE UA: NEGATIVE
Ketones, UA: NEGATIVE
Nitrite, UA: NEGATIVE
PROTEIN UA: NEGATIVE
Spec Grav, UA: >=1.03
UROBILINOGEN UA: 0.2
pH, UA: 6

## 2015-12-08 NOTE — Telephone Encounter (Signed)
Can she come today.

## 2015-12-08 NOTE — Telephone Encounter (Signed)
I spoke with Dr Diona Browner; Dr Diona Browner said she will review pts chart and in the meantime pt to hold the Actos and to keep feet elevated when sitting on laying down. Estill Bamberg voiced understanding and did say that pt keeping her feet elevated but not helping the swelling; will await cb.

## 2015-12-08 NOTE — Assessment & Plan Note (Signed)
No clear UTI.Marland Kitchen Send for culture. Likely secondary to oxybutynin. Hold for now.

## 2015-12-08 NOTE — Patient Instructions (Addendum)
Stop at lab on way out.  We will call with urine culture. Hold oxybutynin and actos for now. Stop at front desk to set up ECHO of heart.

## 2015-12-08 NOTE — Assessment & Plan Note (Signed)
May be secondary to Actos SE. Eval with labs and ECHO.

## 2015-12-08 NOTE — Assessment & Plan Note (Addendum)
Most concerning for cardiac cause.  EKG stable and reassuring. No clear pulmonary symptoms. Send for labs and eval with ECHO.

## 2015-12-08 NOTE — Progress Notes (Signed)
   Subjective:    Patient ID: Jill [name], female    DOB: 1931/11/16, 82 y.o.   MRN: [id number]  HPI  82 year old female with history of  DM, HTN,CKD presents with recent worsening of edema Her daughter has had her stop acots as it can have edema as SE> started in 08/2015 She has noted swelling in last week, gradually worse.  She has held actos in last 3 days.. No improvment in swelling. Worse through the day but not better at night. No new chest pain, some shortness of breath when walking.  She is also on oxybutynin at night.. Helps with urge incontinence at night. Slower stream but still urinating.  No burning with urination. Harder to start and stop urine flow. No fever.  ECHO 2015 nml EF. No history of CAD or CHF.  08/2105 nml thyroid  Wt Readings from Last 3 Encounters:  12/08/15 189 lb (85.73 kg)  10/13/15 188 lb 6.4 oz (85.458 kg)  09/03/15 181 lb (82.101 kg)   BP Readings from Last 3 Encounters:  12/08/15 139/66  10/13/15 166/82  09/03/15 130/70   Todays EKG.. No change from previous.  Review of Systems  Constitutional: Positive for fatigue. Negative for fever.  HENT: Negative for ear pain.   Eyes: Negative for pain.  Respiratory: Positive for shortness of breath. Negative for cough, chest tightness and wheezing.   Cardiovascular: Positive for leg swelling. Negative for chest pain and palpitations.  Gastrointestinal: Negative for abdominal pain.  Genitourinary: Negative for dysuria.  Neurological: Positive for weakness.       Objective:   Physical Exam  Constitutional: Vital signs are normal. She appears well-developed and well-nourished. She is cooperative.  Non-toxic appearance. She does not appear ill. No distress.  Elderly female in NAD  HENT:  Head: Normocephalic.  Right Ear: Hearing, tympanic membrane, external ear and ear canal normal. Tympanic membrane is not erythematous, not retracted and not bulging.  Left Ear: Hearing, tympanic membrane,  external ear and ear canal normal. Tympanic membrane is not erythematous, not retracted and not bulging.  Nose: No mucosal edema or rhinorrhea. Right sinus exhibits no maxillary sinus tenderness and no frontal sinus tenderness. Left sinus exhibits no maxillary sinus tenderness and no frontal sinus tenderness.  Mouth/Throat: Uvula is midline, oropharynx is clear and moist and mucous membranes are normal.  Eyes: Conjunctivae, EOM and lids are normal. Pupils are equal, round, and reactive to light. Lids are everted and swept, no foreign bodies found.  Neck: Trachea normal and normal range of motion. Neck supple. Carotid bruit is not present. No thyroid mass and no thyromegaly present.  Cardiovascular: Normal rate, regular rhythm, S1 normal, S2 normal, normal heart sounds, intact distal pulses and normal pulses.  Exam reveals no gallop and no friction rub.   No murmur heard. 1 plus non pitting edema  Pulmonary/Chest: Effort normal and breath sounds normal. No tachypnea. No respiratory distress. She has no decreased breath sounds. She has no wheezes. She has no rhonchi. She has no rales.  Abdominal: Soft. Normal appearance and bowel sounds are normal. There is no tenderness.  Neurological: She is alert.  Skin: Skin is warm, dry and intact. No rash noted.  Psychiatric: Her speech is normal and behavior is normal. Judgment and thought content normal. Her mood appears not anxious. Cognition and memory are normal. She does not exhibit a depressed mood.          Assessment & Plan:

## 2015-12-08 NOTE — Assessment & Plan Note (Signed)
Follow closely off actos.

## 2015-12-08 NOTE — Telephone Encounter (Signed)
Appointment scheduled today at 3:00pm.

## 2015-12-08 NOTE — Progress Notes (Signed)
Pre visit review using our clinic review tool, if applicable. No additional management support is needed unless otherwise documented below in the visit note. 

## 2015-12-08 NOTE — Telephone Encounter (Signed)
Dr. Diona Browner can see Ms. Nydam at 3:00pm today if she is able to come in.  Left message for Estill Bamberg to return my call.

## 2015-12-08 NOTE — Telephone Encounter (Signed)
Jill [name] (DPR signed) left v/m pt has appt to see Dr Diona Browner on 12/10/15 at 3:45 pm. Pt has generalized fluid throughout her body and her feet are very swollen; Jill [name] thinks swelling caused by Actos; Jill [name] wants to know if can stop Actos until seen on 12/10/15; pt has stage 3 kidney failure. Amanda request cb. I spoke with Jill [name]; pt has been swelling since 12/03/15, swelling does not improve when sleeping and feet are up; Jill [name] said pt is very weak and SOB upon exertion. Jill [name] is not sure if pt should wait until 12/10/15 but pt only wants to see Dr Diona Browner.  Pt stated that she feels like she is walking on balloons her feet are so swollen. Jill [name] voiced understanding if pt condition changes or worsens prior to cb to take pt to ED.

## 2015-12-09 LAB — CBC WITH DIFFERENTIAL/PLATELET
BASOS PCT: 4.9 % — AB (ref 0.0–3.0)
Basophils Absolute: 0.4 10*3/uL — ABNORMAL HIGH (ref 0.0–0.1)
EOS PCT: 1.8 % (ref 0.0–5.0)
Eosinophils Absolute: 0.2 10*3/uL (ref 0.0–0.7)
HEMATOCRIT: 29.8 % — AB (ref 36.0–46.0)
HEMOGLOBIN: 9.8 g/dL — AB (ref 12.0–15.0)
LYMPHS PCT: 40.3 % (ref 12.0–46.0)
Lymphs Abs: 3.5 10*3/uL (ref 0.7–4.0)
MCHC: 33 g/dL (ref 30.0–36.0)
MCV: 85.7 fl (ref 78.0–100.0)
MONOS PCT: 6.7 % (ref 3.0–12.0)
Monocytes Absolute: 0.6 10*3/uL (ref 0.1–1.0)
NEUTROS PCT: 46.3 % (ref 43.0–77.0)
Neutro Abs: 4.1 10*3/uL (ref 1.4–7.7)
Platelets: 293 10*3/uL (ref 150.0–400.0)
RBC: 3.48 Mil/uL — AB (ref 3.87–5.11)
RDW: 15.8 % — AB (ref 11.5–15.5)
WBC: 8.8 10*3/uL (ref 4.0–10.5)

## 2015-12-09 LAB — COMPREHENSIVE METABOLIC PANEL
ALBUMIN: 3.3 g/dL — AB (ref 3.5–5.2)
ALT: 11 U/L (ref 0–35)
AST: 19 U/L (ref 0–37)
Alkaline Phosphatase: 43 U/L (ref 39–117)
BUN: 42 mg/dL — AB (ref 6–23)
CALCIUM: 9.6 mg/dL (ref 8.4–10.5)
CHLORIDE: 105 meq/L (ref 96–112)
CO2: 31 mEq/L (ref 19–32)
Creatinine, Ser: 2.05 mg/dL — ABNORMAL HIGH (ref 0.40–1.20)
GFR: 24.65 mL/min — AB (ref >60.00)
Glucose, Bld: 162 mg/dL — ABNORMAL HIGH (ref 70–99)
POTASSIUM: 4.6 meq/L (ref 3.5–5.1)
SODIUM: 142 meq/L (ref 135–145)
Total Bilirubin: 0.4 mg/dL (ref 0.2–1.2)
Total Protein: 6.5 g/dL (ref 6.0–8.3)

## 2015-12-09 LAB — T3, FREE: T3 FREE: 2.7 pg/mL (ref 2.3–4.2)

## 2015-12-09 LAB — T4, FREE: FREE T4: 0.99 ng/dL (ref 0.60–1.60)

## 2015-12-09 LAB — BRAIN NATRIURETIC PEPTIDE: PRO B NATRI PEPTIDE: 504 pg/mL — AB (ref 0.0–100.0)

## 2015-12-09 LAB — TSH: TSH: 1.94 u[IU]/mL (ref 0.35–4.50)

## 2015-12-10 ENCOUNTER — Ambulatory Visit: Payer: Self-pay | Admitting: Family Medicine

## 2015-12-10 ENCOUNTER — Telehealth: Payer: Self-pay | Admitting: *Deleted

## 2015-12-10 MED ORDER — FUROSEMIDE 20 MG PO TABS: 20.0000 mg | ORAL_TABLET | Freq: Every day | ORAL | Status: DC

## 2015-12-10 NOTE — Telephone Encounter (Signed)
-----   Message from Jill Sanders, MD sent at 12/10/2015  1:39 PM EDT ----- Notify pt kidney function slightly worse, BNP elevated suggesting some fluid overload of heart. Await ECHO. Start low dose lasix 20 mg daily  X 3 days, then  Daily prn swelling ( please call in # 20 ,0 refills) then have her follow up next Tuesday.  Also her hemoglobin is dropped back to previous levels, we can eval further with iron eval at next OV. IN past this has been due to kidney and chronic disease

## 2015-12-10 NOTE — Telephone Encounter (Signed)
Ms. Willborn notified as instructed by telephone.  Lasix 20 mg prescription sent into [facility].  Follow up appointment scheduled for 12/15/2015 at 9:00 am with Dr. Diona Browner.

## 2015-12-11 ENCOUNTER — Telehealth: Payer: Self-pay | Admitting: Family Medicine

## 2015-12-11 LAB — URINE CULTURE: Colony Count: >=100000

## 2015-12-11 MED ORDER — CIPROFLOXACIN HCL 250 MG PO TABS: 250.0000 mg | ORAL_TABLET | Freq: Two times a day (BID) | ORAL | Status: DC

## 2015-12-11 NOTE — Telephone Encounter (Signed)
Notify pt proteus in urine.. Need to sart antibiotics. Rx sent to pharmacy.

## 2015-12-11 NOTE — Telephone Encounter (Signed)
Ms. Detty notified as instructed by telephone.

## 2015-12-14 ENCOUNTER — Other Ambulatory Visit: Payer: Self-pay | Admitting: Family Medicine

## 2015-12-14 DIAGNOSIS — R0600 Dyspnea, unspecified: Secondary | ICD-10-CM

## 2015-12-15 ENCOUNTER — Ambulatory Visit (INDEPENDENT_AMBULATORY_CARE_PROVIDER_SITE_OTHER): Payer: Medicare Other | Admitting: Family Medicine

## 2015-12-15 ENCOUNTER — Encounter: Payer: Self-pay | Admitting: Family Medicine

## 2015-12-15 VITALS — BP 122/92 | HR 98 | Temp 98.4°F | Ht 62.0 in | Wt 183.5 lb

## 2015-12-15 DIAGNOSIS — R339 Retention of urine, unspecified: Secondary | ICD-10-CM

## 2015-12-15 DIAGNOSIS — R609 Edema, unspecified: Secondary | ICD-10-CM

## 2015-12-15 DIAGNOSIS — N189 Chronic kidney disease, unspecified: Secondary | ICD-10-CM | POA: Diagnosis not present

## 2015-12-15 DIAGNOSIS — R0609 Other forms of dyspnea: Secondary | ICD-10-CM

## 2015-12-15 DIAGNOSIS — E119 Type 2 diabetes mellitus without complications: Secondary | ICD-10-CM

## 2015-12-15 DIAGNOSIS — N390 Urinary tract infection, site not specified: Secondary | ICD-10-CM | POA: Diagnosis not present

## 2015-12-15 DIAGNOSIS — D631 Anemia in chronic kidney disease: Secondary | ICD-10-CM | POA: Diagnosis not present

## 2015-12-15 LAB — CBC WITH DIFFERENTIAL/PLATELET
BASOS ABS: 0 10*3/uL (ref 0.0–0.1)
Basophils Relative: 0.3 % (ref 0.0–3.0)
Eosinophils Absolute: 0 10*3/uL (ref 0.0–0.7)
Eosinophils Relative: 0 % (ref 0.0–5.0)
HCT: 34.4 % — ABNORMAL LOW (ref 36.0–46.0)
Hemoglobin: 11 g/dL — ABNORMAL LOW (ref 12.0–15.0)
LYMPHS ABS: 2.9 10*3/uL (ref 0.7–4.0)
LYMPHS PCT: 29.8 % (ref 12.0–46.0)
MCHC: 31.9 g/dL (ref 30.0–36.0)
MCV: 86.4 fl (ref 78.0–100.0)
MONOS PCT: 4.4 % (ref 3.0–12.0)
Monocytes Absolute: 0.4 10*3/uL (ref 0.1–1.0)
NEUTROS PCT: 65.5 % (ref 43.0–77.0)
Neutro Abs: 6.3 10*3/uL (ref 1.4–7.7)
Platelets: 303 10*3/uL (ref 150.0–400.0)
RBC: 3.98 Mil/uL (ref 3.87–5.11)
RDW: 16 % — ABNORMAL HIGH (ref 11.5–15.5)
WBC: 9.6 10*3/uL (ref 4.0–10.5)

## 2015-12-15 LAB — BASIC METABOLIC PANEL
BUN: 31 mg/dL — ABNORMAL HIGH (ref 6–23)
CALCIUM: 9.3 mg/dL (ref 8.4–10.5)
CO2: 27 mEq/L (ref 19–32)
Chloride: 103 mEq/L (ref 96–112)
Creatinine, Ser: 2.24 mg/dL — ABNORMAL HIGH (ref 0.40–1.20)
GFR: 22.25 mL/min — AB (ref >60.00)
GLUCOSE: 286 mg/dL — AB (ref 70–99)
Potassium: 5.1 mEq/L (ref 3.5–5.1)
SODIUM: 138 meq/L (ref 135–145)

## 2015-12-15 LAB — IBC PANEL
Iron: 70 ug/dL (ref 42–145)
SATURATION RATIOS: 12.7 % — AB (ref 20.0–50.0)
TRANSFERRIN: 393 mg/dL — AB (ref 212.0–360.0)

## 2015-12-15 LAB — FERRITIN: Ferritin: 24.7 ng/mL (ref 10.0–291.0)

## 2015-12-15 MED ORDER — GLIPIZIDE ER 5 MG PO TB24: 5.0000 mg | ORAL_TABLET | Freq: Every day | ORAL | Status: DC

## 2015-12-15 NOTE — Assessment & Plan Note (Signed)
Much improved with lasix. Check BMET today. ? Secondary to actos, now off. ECHO in past nml, but BNP elevated. ? Possibly s econdary to fluid overload.

## 2015-12-15 NOTE — Assessment & Plan Note (Signed)
Likely multifactorial... Anemia,  eval heart with ECHO.

## 2015-12-15 NOTE — Assessment & Plan Note (Signed)
?   Secondary to ifncetion vs oxybutynin.  For now hold oxybutinin, may try again once cipro done  Given pt reported excellent improvement.

## 2015-12-15 NOTE — Assessment & Plan Note (Signed)
Proteus, complete course of cipro.

## 2015-12-15 NOTE — Progress Notes (Signed)
Pre visit review using our clinic review tool, if applicable. No additional management support is needed unless otherwise documented below in the visit note. 

## 2015-12-15 NOTE — Assessment & Plan Note (Signed)
Recent worsening.. Will check iron levels. No clear new cause for drop.

## 2015-12-15 NOTE — Patient Instructions (Addendum)
Complete cipro.  We will await the upcoming ECHO.  Stop at lab on way out to check potassium and kidney function.  Hold lasix for now.  Follow blood sugar at home. Start glucotrol daily once cipro done, make sure to not skip meals.

## 2015-12-15 NOTE — Progress Notes (Signed)
   Subjective:    Patient ID: Jill [name], female    DOB: [birth year], 82 y.o.   MRN: [id number]  HPI 82 year old female presents for 1 week follow up on  DOE andperipheral edema. and  Proteus UTI.  Now off actos as may have been contributing to edema.  Labs 4/18:BNP was found to be elevated at 504. Cr elevated above baseline 1.8 at 2.05, thyroid nml Started on lasix 20 mg daily x 3 days. ECHO: pending Last 01/2015 ECHO was normal.  Today she reports swelling is improved. Needs repeat BMET  She has had slightly improved DOE but still some occuring.   Wt Readings from Last 3 Encounters:  12/15/15 183 lb 8 oz (83.235 kg)  12/08/15 189 lb (85.73 kg)  10/13/15 188 lb 6.4 oz (85.458 kg)   BP Readings from Last 3 Encounters:  12/15/15 122/92  12/08/15 139/66  10/13/15 166/82    Proteus UTI: now on course of cipro. No dysuria, no current urgency. Hesitancy is improved off oxybutynin.  Anemia, chronic: Has lower some from last check 5 months ago.  4/18 at 9.8 No bleeding.  DM, not checking at home.. Machine broken .Marland Kitchen Daughter will get her a new one.  CBG 162    Review of Systems  Constitutional: Negative for fever and fatigue.  HENT: Negative for ear pain.   Eyes: Negative for pain.  Respiratory: Negative for chest tightness and shortness of breath.   Cardiovascular: Negative for chest pain, palpitations and leg swelling.  Gastrointestinal: Negative for abdominal pain.  Genitourinary: Negative for dysuria.       Objective:   Physical Exam  Constitutional: Vital signs are normal. She appears well-developed and well-nourished. She is cooperative.  Non-toxic appearance. She does not appear ill. No distress.  HENT:  Head: Normocephalic.  Right Ear: Hearing, tympanic membrane, external ear and ear canal normal. Tympanic membrane is not erythematous, not retracted and not bulging.  Left Ear: Hearing, tympanic membrane, external ear and ear canal normal. Tympanic membrane is  not erythematous, not retracted and not bulging.  Nose: No mucosal edema or rhinorrhea. Right sinus exhibits no maxillary sinus tenderness and no frontal sinus tenderness. Left sinus exhibits no maxillary sinus tenderness and no frontal sinus tenderness.  Mouth/Throat: Uvula is midline, oropharynx is clear and moist and mucous membranes are normal.  Eyes: Conjunctivae, EOM and lids are normal. Pupils are equal, round, and reactive to light. Lids are everted and swept, no foreign bodies found.  Neck: Trachea normal and normal range of motion. Neck supple. Carotid bruit is not present. No thyroid mass and no thyromegaly present.  Cardiovascular: Normal rate, regular rhythm, S1 normal, S2 normal, normal heart sounds, intact distal pulses and normal pulses.  Exam reveals no gallop and no friction rub.   No murmur heard. No peripheral edema  Pulmonary/Chest: Effort normal and breath sounds normal. No tachypnea. No respiratory distress. She has no decreased breath sounds. She has no wheezes. She has no rhonchi. She has no rales.  Abdominal: Soft. Normal appearance and bowel sounds are normal. There is no tenderness.  Neurological: She is alert.  Skin: Skin is warm, dry and intact. No rash noted.  Psychiatric: Her speech is normal and behavior is normal. Judgment and thought content normal. Her mood appears not anxious. Cognition and memory are normal. She does not exhibit a depressed mood.          Assessment & Plan:

## 2015-12-15 NOTE — Assessment & Plan Note (Signed)
Inadequate control.  Cannot use metformin due to CKD, januvia caused hives.  Will try glucotrol XL  Low dose.  Follow CBGs, get new meter.

## 2015-12-21 ENCOUNTER — Other Ambulatory Visit: Payer: Self-pay

## 2015-12-21 ENCOUNTER — Ambulatory Visit (INDEPENDENT_AMBULATORY_CARE_PROVIDER_SITE_OTHER): Payer: Medicare Other

## 2015-12-21 DIAGNOSIS — R0600 Dyspnea, unspecified: Secondary | ICD-10-CM

## 2016-01-19 ENCOUNTER — Encounter: Payer: Self-pay | Admitting: Urology

## 2016-01-19 ENCOUNTER — Telehealth: Payer: Self-pay | Admitting: *Deleted

## 2016-01-19 ENCOUNTER — Ambulatory Visit (INDEPENDENT_AMBULATORY_CARE_PROVIDER_SITE_OTHER): Payer: Medicare Other | Admitting: Urology

## 2016-01-19 VITALS — BP 115/69 | HR 98 | Ht 62.0 in | Wt 183.3 lb

## 2016-01-19 DIAGNOSIS — E162 Hypoglycemia, unspecified: Secondary | ICD-10-CM | POA: Diagnosis not present

## 2016-01-19 DIAGNOSIS — R339 Retention of urine, unspecified: Secondary | ICD-10-CM | POA: Diagnosis not present

## 2016-01-19 DIAGNOSIS — N390 Urinary tract infection, site not specified: Secondary | ICD-10-CM

## 2016-01-19 DIAGNOSIS — N952 Postmenopausal atrophic vaginitis: Secondary | ICD-10-CM

## 2016-01-19 LAB — URINALYSIS, COMPLETE
BILIRUBIN UA: NEGATIVE
Glucose, UA: NEGATIVE
KETONES UA: NEGATIVE
Nitrite, UA: NEGATIVE
PH UA: 5.5 (ref 5.0–7.5)
PROTEIN UA: NEGATIVE
RBC UA: NEGATIVE
SPEC GRAV UA: 1.01 (ref 1.005–1.030)
UUROB: 0.2 mg/dL (ref 0.2–1.0)

## 2016-01-19 LAB — MICROSCOPIC EXAMINATION: RBC, UA: NONE SEEN /hpf (ref 0–?)

## 2016-01-19 NOTE — Progress Notes (Signed)
2:03 PM   Jill [name] 03/02/1932 [id number]  Referring provider: Jinny Sanders, MD [address] [place], [place] [address]  Chief Complaint  Patient presents with  . Recurrent UTI    6 month follow up  . Incomplete Emptying    HPI: Patient is an 82 year old Caucasian female who has a history of a renal abscess, recurrent UTI and incomplete bladder emptying who presents for 6 month follow-up.  History of renal abscess An MRI done in 06/2015 noted that the renal abscess resolved.  She has not had any further right-sided flank pain. Her UA today is unremarkable.  She has not had any recent fevers, chills, nausea or vomiting.  Recurrent UTI She does have a history of recurrent urinary tract infections and has been treated multiple times over the past year. She does try to drink cranberry juice.  Her last documented infection with [place] was in April 18 for Proteus.  She is not having symptoms of a UTI at this time. Her UA today is unremarkable.  Incomplete bladder emptying She reported  that she does at times have difficulty emptying her bladder. She has difficulty starting her stream and sometimes thinks that she may not be emptying all of the way.  Her daughter states that she has been having frequent urination during the night and very little urination during the day.  Her daughter measured her urine output.  She was experiencing higher volumes at night.  She was evaluated by her PCP and was found to have diastolic heart failure resulting in pedal edema.  She is on diuretics at this time.     PMH: Past Medical History  Diagnosis Date  . Diabetes mellitus   . Hyperlipidemia   . Chronic kidney disease   . Depression   . Allergy   . Hypertension   . Vasovagal syncope 2006    Negative cardiac workup-myoview, echo  . Diverticula, colon   . Family history of anesthesia complication     Son is difficult intubation  . Complication of anesthesia     difficult waking   .  Arthritis     osteoarthritis     Surgical History: Past Surgical History  Procedure Laterality Date  . Abdominal hysterectomy  1977    fibroid  . Joint replacement  2009    rt hip  . Total hip arthroplasty Left 05/12/2014    dr Mayer Camel  . Total hip arthroplasty Left 05/12/2014    Procedure: TOTAL HIP ARTHROPLASTY;  Surgeon: Kerin Salen, MD;  Location: [facility];  Service: Orthopedics;  Laterality: Left;    Home Medications:    Medication List       This list is accurate as of: 01/19/16  2:03 PM.  Always use your most recent med list.               ALPRAZolam 0.25 MG tablet  Commonly known as:  XANAX  TAKE 1 TABLET BY MOUTH TWICE A DAY     aspirin EC 325 MG tablet  Take 1 tablet (325 mg total) by mouth 2 (two) times daily.     Cholecalciferol 1000 units capsule  Take 1,000 Units by mouth daily. Reported on 01/19/2016     ciprofloxacin 250 MG tablet  Commonly known as:  CIPRO  Take 1 tablet (250 mg total) by mouth 2 (two) times daily.     escitalopram 20 MG tablet  Commonly known as:  LEXAPRO  Take 1 tablet (20  mg total) by mouth daily.     estradiol 0.1 MG/GM vaginal cream  Commonly known as:  ESTRACE  Place [id number] Applicatorfuls vaginally 3 (three) times a week. Please pea size amount per urethra before bed M-W-Fr     fenofibrate 54 MG tablet  TAKE TWO TABLETS BY MOUTH DAILY     furosemide 20 MG tablet  Commonly known as:  LASIX  Take 1 tablet (20 mg total) by mouth daily.     glipiZIDE 5 MG 24 hr tablet  Commonly known as:  GLUCOTROL XL  Take 1 tablet (5 mg total) by mouth daily with breakfast.     TIZANIDINE HCL PO  Take by mouth as needed. Reported on 01/19/2016        Allergies:  Allergies  Allergen Reactions  . Lovastatin Other (See Comments)    REACTION: leg pain  . Statins Other (See Comments)    REACTION: leg cramps, weakness  . Sulfa Antibiotics Hives and Itching  . Codeine Rash  . Niacin Rash    Flushing also    Family History: Family  History  Problem Relation Age of Onset  . COPD Brother   . Heart disease Brother   . Diabetes Brother   . Lymphoma Brother   . Alzheimer's disease Brother   . Heart disease Mother   . Pancreatic cancer Sister   . Alzheimer's disease Sister   . Breast cancer Sister   . Leukemia Other   . Kidney disease Neg Hx     Social History:  reports that she quit smoking about 16 years ago. Her smoking use included Cigarettes. She has a 45 pack-year smoking history. She has never used smokeless tobacco. She reports that she does not drink alcohol or use illicit drugs.  ROS: UROLOGY Frequent Urination?: No Hard to postpone urination?: No Burning/pain with urination?: No Get up at night to urinate?: Yes Leakage of urine?: Yes Urine stream starts and stops?: No Trouble starting stream?: Yes Do you have to strain to urinate?: No Blood in urine?: No Urinary tract infection?: No Sexually transmitted disease?: No Injury to kidneys or bladder?: No Painful intercourse?: No Weak stream?: Yes Currently pregnant?: No Vaginal bleeding?: No Last menstrual period?: n  Gastrointestinal Nausea?: Yes Vomiting?: No Indigestion/heartburn?: Yes Diarrhea?: No Constipation?: No  Constitutional Fever: No Night sweats?: No Weight loss?: No Fatigue?: Yes  Skin Skin rash/lesions?: No Itching?: No  Eyes Blurred vision?: No Double vision?: No  Ears/Nose/Throat Sore throat?: No Sinus problems?: No  Hematologic/Lymphatic Swollen glands?: No Easy bruising?: Yes  Cardiovascular Leg swelling?: No Chest pain?: No  Respiratory Cough?: No Shortness of breath?: No  Endocrine Excessive thirst?: No  Musculoskeletal Back pain?: No Joint pain?: No  Neurological Headaches?: No Dizziness?: No  Psychologic Depression?: No Anxiety?: No  Physical Exam: BP 115/69 mmHg  Pulse 98  Ht 5\' 2"  (1.575 m)  Wt 183 lb 4.8 oz (83.144 kg)  BMI 33.52 kg/m2  Constitutional:  Alert and oriented,  No acute distress. Presents with daughter today. HEENT: [place] AT, moist mucus membranes.  Trachea midline, no masses. Cardiovascular: No clubbing, cyanosis, or edema. Respiratory: Normal respiratory effort, no increased work of breathing. GI: Abdomen is soft, nontender, nondistended, no abdominal masses GU: No CVA tenderness.  Skin: No rashes, bruises or suspicious lesions. Neurologic: Grossly intact, no focal deficits, moving all 4 extremities. Psychiatric: Normal mood and affect.  Laboratory Data: Lab Results  Component Value Date   WBC 9.6 12/15/2015   HGB 11.0* 12/15/2015  HCT 34.4* 12/15/2015   MCV 86.4 12/15/2015   PLT 303.0 12/15/2015    Lab Results  Component Value Date   CREATININE 2.24* 12/15/2015    Lab Results  Component Value Date   HGBA1C 7.1* 05/26/2015    Urinalysis Results for orders placed or performed in visit on 01/19/16  Microscopic Examination  Result Value Ref Range   WBC, UA >30 (A) 0 -  5 /hpf   RBC, UA None seen 0 -  2 /hpf   Epithelial Cells (non renal) >10 (A) 0 - 10 /hpf   Bacteria, UA Few None seen/Few  Urinalysis, Complete  Result Value Ref Range   Specific Gravity, UA 1.010 1.005 - 1.030   pH, UA 5.5 5.0 - 7.5   Color, UA Yellow Yellow   Appearance Ur Cloudy (A) Clear   Leukocytes, UA 3+ (A) Negative   Protein, UA Negative Negative/Trace   Glucose, UA Negative Negative   Ketones, UA Negative Negative   RBC, UA Negative Negative   Bilirubin, UA Negative Negative   Urobilinogen, Ur 0.2 0.2 - 1.0 mg/dL   Nitrite, UA Negative Negative   Microscopic Examination See below:       Pertinent Imaging: CLINICAL DATA: 82 year old with right renal lesion. History of renal abscess and renal insufficiency. Subsequent encounter.  EXAM: MRI ABDOMEN WITHOUT CONTRAST  TECHNIQUE: Multiplanar multisequence MR imaging was performed without the administration of intravenous contrast. Contrast was not administered due to renal  insufficiency.  COMPARISON: Ultrasound examinations ranging from 01/31/2015 through 05/12/2015. CT 01/27/2015.  FINDINGS: Lower chest: Unremarkable.  Hepatobiliary: There are probable tiny cysts within the posterior segment of the right hepatic lobe. No suspicious hepatic findings. No significant biliary dilatation. The gallbladder appears normal without wall thickening or stones.  Pancreas: Mildly atrophied without focal abnormality or surrounding inflammation.  Spleen: Normal in size without focal abnormality.  Adrenals/Urinary Tract: Both adrenal glands appear normal. Both kidneys demonstrate cortical scarring, cortical lobularity and small cysts. There is no residual subcapsular or perinephric fluid collection on the right. There is no suspicious parenchymal lesion on noncontrast imaging. There is no hydronephrosis.  Stomach/Bowel: No evidence of bowel wall thickening, distention or surrounding inflammatory change.Distal colonic diverticular changes noted.  Vascular/Lymphatic: There are no enlarged abdominal lymph nodes. Aortoiliac atherosclerosis better demonstrated on CT.  Other: None.  Musculoskeletal: No acute or significant osseous findings. Multilevel lumbar spondylosis noted.  IMPRESSION: 1. No significant residual perinephric/subcapsular fluid collection on the right. 2. Both kidneys demonstrate cortical thinning and small cysts, but no suspicious lesions. 3. No acute findings.   Electronically Signed  By: Richardean Sale M.D.  On: 07/10/2015 16:49  Assessment & Plan:    1. Renal hematoma:   Resolved, no underlying mass.  MRI confirmed resolution on 07/10/2015.    2. Incomplete bladder emptying:   Patient is currently on diuretics and is weighing herself daily.  She has not had any increase of weight.  Recheck her PVR when she returns in 3 months.    - Urinalysis, Complete  3. Recurrent UTI:   She is not having symptoms of an UTI at  this time.     4. Atrophic vaginitis:   Patient will continue the vaginal estrogen cream.   (Pea size amount per urethra Monday/Wednesday/Friday)  We will reexamine in 3 months.    5. Hypoglycemia:   Patient is having BS in the 40's.  She will discontinue the glipizide until her daughter-in-law can speak with Dr. Diona Browner.   Return in about  3 months (around 04/20/2016) for exam.  Zara Council, [place]  [facility] [address], [place], [place] [address] [phone]

## 2016-01-19 NOTE — Telephone Encounter (Signed)
Patient has been sick with nausea and vomiting for the past 2-3 days and states she has had a decreased appetite.  Her blood sugars have been in the 30's to 40's.  She mentioned she has been seeing "white clouds" and feeling weak when her sugar gets low.  She mentioned all of this while seeing her urologist today who advised her to discontinue her glipizide and notify her PCP.  Her most recent blood sugar after a snack and orange juice prior to coming here was in the mid 60's.  Per Dr. Diona Browner, patient instructed to continue to hold glipizide and monitor blood sugar frequently.  I encouraged her to increase diet as tolerated and to remain consistent if possible.  Follow up appointment scheduled here 6/1 at 1100.  Patient agrees to ED visit sooner with new or worsening symptoms.

## 2016-01-21 ENCOUNTER — Ambulatory Visit (INDEPENDENT_AMBULATORY_CARE_PROVIDER_SITE_OTHER): Payer: Medicare Other | Admitting: Family Medicine

## 2016-01-21 ENCOUNTER — Encounter: Payer: Self-pay | Admitting: Family Medicine

## 2016-01-21 VITALS — BP 121/75 | HR 82 | Temp 98.4°F | Ht 62.0 in | Wt 183.2 lb

## 2016-01-21 DIAGNOSIS — E11649 Type 2 diabetes mellitus with hypoglycemia without coma: Secondary | ICD-10-CM | POA: Insufficient documentation

## 2016-01-21 NOTE — Patient Instructions (Addendum)
Stay off glucotrol Xl. Try to eat healthy low carb meals , 5 small meals daily. Follow  fasting blood sugar.. Call if FBS > 150.

## 2016-01-21 NOTE — Progress Notes (Signed)
Pre visit review using our clinic review tool, if applicable. No additional management support is needed unless otherwise documented below in the visit note. 

## 2016-01-21 NOTE — Assessment & Plan Note (Signed)
No clear sign of infection but possible exposure to diarrhea illness causing her only nausea. ? Mild decrease in po intake. Also due to low dose glipizide sensitivity.  Hold this  Glipizide. Follow blood sugar at home. Follow up in 1 month.. Given other contraindications and SE to other DM meds, may have to consider 2.5 mg glucotrol if CBG starts running high again.

## 2016-01-21 NOTE — Progress Notes (Signed)
   Subjective:    Patient ID: Jill [name], female    DOB: 18-Aug-1932, 82 y.o.   MRN: [id number]  HPI  82 year old female patient with diabetes, HTN, CKD, anemia, hx of renal hematoma presents with recent low blood sugars.  She also continue to remain with chronic fatigue.  Started glucotrol XL on 4/25.  She had been doing well with FBS 70-92. Only 1 measurement > 200.  In last 3 days she  42, 49, 57. She has been feeling woozy.  She has had a lot of indigestion lately, some nausea, no diarrhea but family with diarrheal illness.  Wt Readings from Last 3 Encounters:  01/21/16 183 lb 4 oz (83.122 kg)  01/19/16 183 lb 4.8 oz (83.144 kg)  12/15/15 183 lb 8 oz (83.235 kg)    At recent urology appt on 5/30, Dr. Ernestine Conrad..  Found to have CBG in 40s. Held glipizide.  No UTI seen at that appt on UA  Lab Results  Component Value Date   HGBA1C 7.1* 05/26/2015   Since being off FBS 112-117.   Social History /Family History/Past Medical History reviewed and updated if needed.   Review of Systems  Constitutional: Negative for fever and fatigue.  HENT: Negative for ear pain.   Eyes: Negative for pain.  Respiratory: Negative for chest tightness and shortness of breath.   Cardiovascular: Negative for chest pain, palpitations and leg swelling.  Gastrointestinal: Negative for abdominal pain.  Genitourinary: Negative for dysuria.       Objective:   Physical Exam  Constitutional: Vital signs are normal. She appears well-developed and well-nourished. She is cooperative.  Non-toxic appearance. She does not appear ill. No distress.  Elderly female in NAD.  HENT:  Head: Normocephalic.  Right Ear: Hearing, tympanic membrane, external ear and ear canal normal. Tympanic membrane is not erythematous, not retracted and not bulging.  Left Ear: Hearing, tympanic membrane, external ear and ear canal normal. Tympanic membrane is not erythematous, not retracted and not bulging.  Nose: No mucosal  edema or rhinorrhea. Right sinus exhibits no maxillary sinus tenderness and no frontal sinus tenderness. Left sinus exhibits no maxillary sinus tenderness and no frontal sinus tenderness.  Mouth/Throat: Uvula is midline, oropharynx is clear and moist and mucous membranes are normal.  Eyes: Conjunctivae, EOM and lids are normal. Pupils are equal, round, and reactive to light. Lids are everted and swept, no foreign bodies found.  Neck: Trachea normal and normal range of motion. Neck supple. Carotid bruit is not present. No thyroid mass and no thyromegaly present.  Cardiovascular: Normal rate, regular rhythm, S1 normal, S2 normal, normal heart sounds, intact distal pulses and normal pulses.  Exam reveals no gallop and no friction rub.   No murmur heard. No peripheral edema  Pulmonary/Chest: Effort normal and breath sounds normal. No tachypnea. No respiratory distress. She has no decreased breath sounds. She has no wheezes. She has no rhonchi. She has no rales.  Abdominal: Soft. Normal appearance and bowel sounds are normal. There is no tenderness.  Neurological: She is alert.  Skin: Skin is warm, dry and intact. No rash noted.  Psychiatric: Her speech is normal and behavior is normal. Judgment and thought content normal. Her mood appears not anxious. Cognition and memory are normal. She does not exhibit a depressed mood.          Assessment & Plan:

## 2016-01-28 LAB — HM DIABETES EYE EXAM

## 2016-01-29 ENCOUNTER — Encounter: Payer: Self-pay | Admitting: Family Medicine

## 2016-02-21 ENCOUNTER — Other Ambulatory Visit: Payer: Self-pay | Admitting: Family Medicine

## 2016-02-26 ENCOUNTER — Ambulatory Visit: Payer: Medicare Other | Admitting: Family Medicine

## 2016-03-18 ENCOUNTER — Encounter: Payer: Self-pay | Admitting: Family Medicine

## 2016-03-18 ENCOUNTER — Ambulatory Visit (INDEPENDENT_AMBULATORY_CARE_PROVIDER_SITE_OTHER): Payer: Medicare Other | Admitting: Family Medicine

## 2016-03-18 VITALS — BP 124/62 | HR 87 | Temp 98.1°F | Ht 62.0 in | Wt 186.2 lb

## 2016-03-18 DIAGNOSIS — M542 Cervicalgia: Secondary | ICD-10-CM

## 2016-03-18 DIAGNOSIS — E0843 Diabetes mellitus due to underlying condition with diabetic autonomic (poly)neuropathy: Secondary | ICD-10-CM

## 2016-03-18 DIAGNOSIS — E785 Hyperlipidemia, unspecified: Secondary | ICD-10-CM

## 2016-03-18 DIAGNOSIS — E1143 Type 2 diabetes mellitus with diabetic autonomic (poly)neuropathy: Secondary | ICD-10-CM

## 2016-03-18 DIAGNOSIS — E1140 Type 2 diabetes mellitus with diabetic neuropathy, unspecified: Secondary | ICD-10-CM | POA: Insufficient documentation

## 2016-03-18 DIAGNOSIS — G8929 Other chronic pain: Secondary | ICD-10-CM

## 2016-03-18 LAB — HM DIABETES FOOT EXAM

## 2016-03-18 NOTE — Progress Notes (Signed)
Pre visit review using our clinic review tool, if applicable. No additional management support is needed unless otherwise documented below in the visit note. 

## 2016-03-18 NOTE — Assessment & Plan Note (Addendum)
Likely due to OA. USe tylenol for pain as well as heat.

## 2016-03-18 NOTE — Assessment & Plan Note (Signed)
Due for re-eval. 

## 2016-03-18 NOTE — Assessment & Plan Note (Signed)
Likely good control off glipizide.  Due for a1C. Encouraged exercise, weight maintanance, healthy eating habits.

## 2016-03-18 NOTE — Progress Notes (Signed)
   Subjective:    Patient ID: Jill [name], female    DOB: 27-Mar-1932, 82 y.o.   MRN: [id number]  HPI   82 year old female presents for follow up DM.  Diabetes:   Due for re-eval. At last OV was having low CBG so glipizide helped. Lab Results  Component Value Date   HGBA1C 7.1 (H) 05/26/2015  Using medications without difficulties: Hypoglycemic episodes:none Hyperglycemic episodes: rarely Feet problems: no ulcers Blood Sugars averaging: FBS 107-120 eye exam within last year: yes  Acute neck pain on chronic : Not worse. occ using aspirin for pain. No recent falls or new injuries. No arm weakness, no numbness.   Social History /Family History/Past Medical History reviewed and updated if needed.    Review of Systems  Constitutional: Negative for fatigue.  HENT: Negative for ear pain.   Eyes: Negative for pain.  Respiratory: Negative for cough and shortness of breath.   Cardiovascular: Negative for chest pain.       Objective:   Physical Exam  Constitutional: Vital signs are normal. She appears well-developed and well-nourished. She is cooperative.  Non-toxic appearance. She does not appear ill. No distress.  Elderly female in NAD  HENT:  Head: Normocephalic.  Right Ear: Hearing, tympanic membrane, external ear and ear canal normal. Tympanic membrane is not erythematous, not retracted and not bulging.  Left Ear: Hearing, tympanic membrane, external ear and ear canal normal. Tympanic membrane is not erythematous, not retracted and not bulging.  Nose: No mucosal edema or rhinorrhea. Right sinus exhibits no maxillary sinus tenderness and no frontal sinus tenderness. Left sinus exhibits no maxillary sinus tenderness and no frontal sinus tenderness.  Mouth/Throat: Uvula is midline, oropharynx is clear and moist and mucous membranes are normal.  Eyes: Conjunctivae, EOM and lids are normal. Pupils are equal, round, and reactive to light. Lids are everted and swept, no foreign  bodies found.  Neck: Trachea normal and normal range of motion. Neck supple. Carotid bruit is not present. No thyroid mass and no thyromegaly present.  Cardiovascular: Normal rate, regular rhythm, S1 normal, S2 normal, normal heart sounds, intact distal pulses and normal pulses.  Exam reveals no gallop and no friction rub.   No murmur heard. Pulmonary/Chest: Effort normal and breath sounds normal. No tachypnea. No respiratory distress. She has no decreased breath sounds. She has no wheezes. She has no rhonchi. She has no rales.  Abdominal: Soft. Normal appearance and bowel sounds are normal. There is no tenderness.  Musculoskeletal:       Cervical back: She exhibits decreased range of motion, tenderness and bony tenderness.  Neurological: She is alert.  Skin: Skin is warm, dry and intact. No rash noted.  Psychiatric: Her speech is normal and behavior is normal. Judgment and thought content normal. Her mood appears not anxious. Cognition and memory are normal. She does not exhibit a depressed mood.     Diabetic foot exam: Normal inspection No skin breakdown No calluses  Normal DP pulses Decreased sensation to light touch and monofilament Nails normal     Assessment & Plan:

## 2016-03-18 NOTE — Patient Instructions (Addendum)
Return for labs only when able in next week to check A1C and cholesterol.  Use tylenol for neck pain as well as heat. Call if neck pain worsening for possible X-ray.

## 2016-03-21 ENCOUNTER — Other Ambulatory Visit (INDEPENDENT_AMBULATORY_CARE_PROVIDER_SITE_OTHER): Payer: Medicare Other

## 2016-03-21 DIAGNOSIS — E0843 Diabetes mellitus due to underlying condition with diabetic autonomic (poly)neuropathy: Secondary | ICD-10-CM

## 2016-03-21 DIAGNOSIS — E785 Hyperlipidemia, unspecified: Secondary | ICD-10-CM | POA: Diagnosis not present

## 2016-03-21 LAB — COMPREHENSIVE METABOLIC PANEL
ALT: 13 U/L (ref 0–35)
AST: 17 U/L (ref 0–37)
Albumin: 3.5 g/dL (ref 3.5–5.2)
Alkaline Phosphatase: 46 U/L (ref 39–117)
BUN: 32 mg/dL — AB (ref 6–23)
CHLORIDE: 105 meq/L (ref 96–112)
CO2: 32 meq/L (ref 19–32)
Calcium: 9.5 mg/dL (ref 8.4–10.5)
Creatinine, Ser: 1.91 mg/dL — ABNORMAL HIGH (ref 0.40–1.20)
GFR: 26.73 mL/min — ABNORMAL LOW (ref >60.00)
GLUCOSE: 107 mg/dL — AB (ref 70–99)
Potassium: 4.7 mEq/L (ref 3.5–5.1)
SODIUM: 143 meq/L (ref 135–145)
TOTAL PROTEIN: 6.7 g/dL (ref 6.0–8.3)
Total Bilirubin: 0.4 mg/dL (ref 0.2–1.2)

## 2016-03-21 LAB — HEMOGLOBIN A1C: Hgb A1c MFr Bld: 7.6 % — ABNORMAL HIGH (ref 4.6–6.5)

## 2016-03-21 LAB — LIPID PANEL
CHOLESTEROL: 215 mg/dL — AB (ref 0–200)
HDL: 67.3 mg/dL (ref >39.00)
LDL CALC: 120 mg/dL — AB (ref 0–99)
NonHDL: 147.71
TRIGLYCERIDES: 139 mg/dL (ref 0.0–149.0)
Total CHOL/HDL Ratio: 3
VLDL: 27.8 mg/dL (ref 0.0–40.0)

## 2016-04-26 ENCOUNTER — Encounter: Payer: Medicare Other | Admitting: Urology

## 2016-04-26 ENCOUNTER — Encounter: Payer: Self-pay | Admitting: Urology

## 2016-05-02 NOTE — Progress Notes (Signed)
This encounter was created in error - please disregard.

## 2016-05-05 ENCOUNTER — Other Ambulatory Visit: Payer: Self-pay | Admitting: *Deleted

## 2016-05-05 NOTE — Telephone Encounter (Signed)
Last office visit 03/18/2016.  Last refilled 12/01/2015 for #20 with no refills.

## 2016-05-06 MED ORDER — ALPRAZOLAM 0.25 MG PO TABS: 0.2500 mg | ORAL_TABLET | Freq: Two times a day (BID) | ORAL | 0 refills | Status: DC

## 2016-05-06 NOTE — Telephone Encounter (Signed)
Alprazolam called into [facility]. 

## 2016-05-17 ENCOUNTER — Encounter: Payer: Self-pay | Admitting: Urology

## 2016-05-17 ENCOUNTER — Ambulatory Visit (INDEPENDENT_AMBULATORY_CARE_PROVIDER_SITE_OTHER): Payer: Medicare Other | Admitting: Urology

## 2016-05-17 VITALS — BP 156/82 | HR 90 | Ht 61.0 in | Wt 184.9 lb

## 2016-05-17 DIAGNOSIS — Z87440 Personal history of urinary (tract) infections: Secondary | ICD-10-CM | POA: Diagnosis not present

## 2016-05-17 DIAGNOSIS — N151 Renal and perinephric abscess: Secondary | ICD-10-CM

## 2016-05-17 DIAGNOSIS — N952 Postmenopausal atrophic vaginitis: Secondary | ICD-10-CM

## 2016-05-17 DIAGNOSIS — R339 Retention of urine, unspecified: Secondary | ICD-10-CM | POA: Diagnosis not present

## 2016-05-17 LAB — BLADDER SCAN AMB NON-IMAGING: SCAN RESULT: 0

## 2016-05-17 MED ORDER — ESTRADIOL 0.1 MG/GM VA CREA: TOPICAL_CREAM | VAGINAL | 12 refills | Status: DC

## 2016-05-17 MED ORDER — ESTROGENS, CONJUGATED 0.625 MG/GM VA CREA: 1.0000 | TOPICAL_CREAM | Freq: Every day | VAGINAL | 12 refills | Status: DC

## 2016-05-17 NOTE — Progress Notes (Signed)
10:32 AM   Jill [name] Jul 13, 1932 [id number]  Referring provider: Jinny Sanders, MD [address] [place], [place] [address]  Chief Complaint  Patient presents with  . Follow-up    Incomplete bladder emptying    HPI: Patient is an 82 year old Caucasian female who has a history of a renal abscess, recurrent UTI, vaginal atrophy and incomplete bladder emptying who presents for 3 month follow-up.  History of renal abscess An MRI done in 06/2015 noted that the renal abscess resolved.  She has not had any further right-sided flank pain.  She has not had any recent fevers, chills, nausea or vomiting.  History of recurrent UTI She does have a history of recurrent urinary tract infections and has been treated multiple times over the past year. She does try to drink cranberry juice.  Her last documented infection with [place] was in April 18 for Proteus.  She is not having symptoms of a UTI at this time.    Vaginal atrophy Patient continues to apply the vaginal cream 3 nights weekly.  She is currently relying on samples as the medication is cost prohibitive.  Incomplete bladder emptying She reported  that she does at times have difficulty emptying her bladder. She has difficulty starting her stream and sometimes thinks that she may not be emptying all of the way.  She was evaluated by her PCP and was found to have diastolic heart failure resulting in pedal edema.  She is on diuretics at this time.  Her PVR today was 0 mL.     PMH: Past Medical History:  Diagnosis Date  . Allergy   . Arthritis    osteoarthritis   . Chronic kidney disease   . Complication of anesthesia    difficult waking   . Depression   . Diabetes mellitus   . Diverticula, colon   . Family history of anesthesia complication    Son is difficult intubation  . Hyperlipidemia   . Hypertension   . Vasovagal syncope 2006   Negative cardiac workup-myoview, echo    Surgical History: Past Surgical History:    Procedure Laterality Date  . ABDOMINAL HYSTERECTOMY  1977   fibroid  . JOINT REPLACEMENT  2009   rt hip  . TOTAL HIP ARTHROPLASTY Left 05/12/2014   dr Mayer Camel  . TOTAL HIP ARTHROPLASTY Left 05/12/2014   Procedure: TOTAL HIP ARTHROPLASTY;  Surgeon: Kerin Salen, MD;  Location: [facility];  Service: Orthopedics;  Laterality: Left;    Home Medications:    Medication List       Accurate as of 05/17/16 10:32 AM. Always use your most recent med list.          ALPRAZolam 0.25 MG tablet Commonly known as:  XANAX Take 1 tablet (0.25 mg total) by mouth 2 (two) times daily.   aspirin EC 325 MG tablet Take 1 tablet (325 mg total) by mouth 2 (two) times daily.   Cholecalciferol 1000 units capsule Take 1,000 Units by mouth daily. Reported on 01/19/2016   conjugated estrogens vaginal cream Commonly known as:  PREMARIN Place 1 Applicatorful vaginally daily. Apply 0.5mg  (pea-sized amount)  just inside the vaginal introitus with a finger-tip every night for two weeks and then Monday, Wednesday and Friday nights.   escitalopram 20 MG tablet Commonly known as:  LEXAPRO TAKE 1 TABLET BY MOUTH DAILY   estradiol 0.1 MG/GM vaginal cream Commonly known as:  ESTRACE Place 9.21 Applicatorfuls vaginally 3 (three) times a week. Please  pea size amount per urethra before bed M-W-Fr   estradiol 0.1 MG/GM vaginal cream Commonly known as:  ESTRACE VAGINAL Apply 0.5mg  (pea-sized amount)  just inside the vaginal introitus with a finger-tip every night for two weeks and then Monday, Wednesday and Friday nights.   fenofibrate 54 MG tablet TAKE TWO TABLETS BY MOUTH DAILY   furosemide 20 MG tablet Commonly known as:  LASIX Take 1 tablet (20 mg total) by mouth daily.       Allergies:  Allergies  Allergen Reactions  . Lovastatin Other (See Comments)    REACTION: leg pain  . Statins Other (See Comments)    REACTION: leg cramps, weakness  . Sulfa Antibiotics Hives and Itching  . Codeine Rash  . Niacin  Rash    Flushing also    Family History: Family History  Problem Relation Age of Onset  . COPD Brother   . Heart disease Brother   . Diabetes Brother   . Lymphoma Brother   . Alzheimer's disease Brother   . Heart disease Mother   . Pancreatic cancer Sister   . Alzheimer's disease Sister   . Breast cancer Sister   . Leukemia Other   . Kidney disease Neg Hx     Social History:  reports that she quit smoking about 16 years ago. Her smoking use included Cigarettes. She has a 45.00 pack-year smoking history. She has never used smokeless tobacco. She reports that she does not drink alcohol or use drugs.  ROS: UROLOGY Frequent Urination?: No Hard to postpone urination?: Yes Burning/pain with urination?: No Get up at night to urinate?: Yes Leakage of urine?: Yes Urine stream starts and stops?: Yes Trouble starting stream?: Yes Do you have to strain to urinate?: No Blood in urine?: No Urinary tract infection?: No Sexually transmitted disease?: No Injury to kidneys or bladder?: No Painful intercourse?: No Weak stream?: No Currently pregnant?: No Vaginal bleeding?: No Last menstrual period?: n  Gastrointestinal Nausea?: No Vomiting?: No Indigestion/heartburn?: No Diarrhea?: No Constipation?: No  Constitutional Fever: No Night sweats?: No Weight loss?: No Fatigue?: Yes  Skin Skin rash/lesions?: No Itching?: No  Eyes Blurred vision?: No Double vision?: No  Ears/Nose/Throat Sore throat?: No Sinus problems?: No  Hematologic/Lymphatic Swollen glands?: No Easy bruising?: Yes  Cardiovascular Leg swelling?: No Chest pain?: No  Respiratory Cough?: No Shortness of breath?: Yes  Endocrine Excessive thirst?: No  Musculoskeletal Back pain?: No Joint pain?: No  Neurological Headaches?: No Dizziness?: No  Psychologic Depression?: No Anxiety?: No  Physical Exam: BP (!) 156/82 (BP Location: Left Arm, Patient Position: Sitting, Cuff Size: Normal)    Pulse 90   Ht 5\' 1"  (1.549 m)   Wt 184 lb 14.4 oz (83.9 kg)   BMI 34.94 kg/m   Constitutional: Well nourished. Alert and oriented, No acute distress. HEENT: [place] AT, moist mucus membranes. Trachea midline, no masses. Cardiovascular: No clubbing, cyanosis, or edema. Respiratory: Normal respiratory effort, no increased work of breathing. GI: Abdomen is soft, non tender, non distended, no abdominal masses. Liver and spleen not palpable.  No hernias appreciated.  Stool sample for occult testing is not indicated.   GU: No CVA tenderness.  No bladder fullness or masses.  Atrophic external genitalia, normal pubic hair distribution, no lesions.  Normal urethral meatus, no lesions, no prolapse, no discharge.   No urethral masses, tenderness and/or tenderness. No bladder fullness, tenderness or masses. Pale vagina mucosa, poor estrogen effect, no discharge, no lesions, good pelvic support, no cystocele or rectocele  noted.  Cervix and uterus are surgically absent.  No pelvic masses or tenderness noted.  Anus and perineum are without rashes or lesions.    Skin: No rashes, bruises or suspicious lesions. Lymph: No cervical or inguinal adenopathy. Neurologic: Grossly intact, no focal deficits, moving all 4 extremities. Psychiatric: Normal mood and affect.  Laboratory Data: Lab Results  Component Value Date   WBC 9.6 12/15/2015   HGB 11.0 (L) 12/15/2015   HCT 34.4 (L) 12/15/2015   MCV 86.4 12/15/2015   PLT 303.0 12/15/2015    Lab Results  Component Value Date   CREATININE 1.91 (H) 03/21/2016    Lab Results  Component Value Date   HGBA1C 7.6 (H) 03/21/2016    Pertinent Imaging Results for orders placed or performed in visit on 05/17/16  Bladder Scan (Post Void Residual) in office  Result Value Ref Range   Scan Result 0     Assessment & Plan:    1. History of renal abscess  - Resolved, no underlying mass  - MRI confirmed resolution on 07/10/2015.    2. Incomplete bladder emptying:      - Patient is currently on diuretics and is weighing herself daily.    - She has not had any increase of weight.    - PVR is 0 mL on today's visit  - Recheck her PVR when she returns in 1 year    3. History of recurrent UTI:     - She is not having symptoms of an UTI at this time.     4. Atrophic vaginitis:     - Patient will continue the vaginal estrogen cream.   (Pea size amount per urethra Monday/Wednesday/Friday)    - We will reexamine in 12 months.    - And the patient prescriptions for both Estrace and Premarin cream, they will carry them to the pharmacy to see which prescription may be more economical, if both are cost prohibitive, we can prescribe a compounded vaginal estrogen cream or provide samples  Return in about 1 year (around 05/17/2017) for eaxm and symptom recheck.  Zara Council, [facility] [address] [place], [place] [address] [phone]

## 2016-06-24 ENCOUNTER — Encounter: Payer: Self-pay | Admitting: Family Medicine

## 2016-06-24 ENCOUNTER — Ambulatory Visit (INDEPENDENT_AMBULATORY_CARE_PROVIDER_SITE_OTHER): Payer: Medicare Other | Admitting: Family Medicine

## 2016-06-24 VITALS — BP 154/80 | HR 79 | Temp 97.9°F | Ht 62.0 in | Wt 185.5 lb

## 2016-06-24 DIAGNOSIS — Z23 Encounter for immunization: Secondary | ICD-10-CM | POA: Diagnosis not present

## 2016-06-24 DIAGNOSIS — W19XXXA Unspecified fall, initial encounter: Secondary | ICD-10-CM

## 2016-06-24 DIAGNOSIS — I10 Essential (primary) hypertension: Secondary | ICD-10-CM

## 2016-06-24 DIAGNOSIS — R413 Other amnesia: Secondary | ICD-10-CM | POA: Insufficient documentation

## 2016-06-24 DIAGNOSIS — H8110 Benign paroxysmal vertigo, unspecified ear: Secondary | ICD-10-CM | POA: Diagnosis not present

## 2016-06-24 DIAGNOSIS — E0843 Diabetes mellitus due to underlying condition with diabetic autonomic (poly)neuropathy: Secondary | ICD-10-CM | POA: Diagnosis not present

## 2016-06-24 NOTE — Assessment & Plan Note (Signed)
Previously well controlle don ACEI. Now increased.. Check at home. Restart lisinopril if elevated at home.

## 2016-06-24 NOTE — Assessment & Plan Note (Signed)
Pt states not new, grandaughter states  Short term memory may have been more suddenly worse lately. NO confusion.  Will eval with labs, plan MMSE at follow up appt in 2 weeks. Call sooner if worsening.

## 2016-06-24 NOTE — Progress Notes (Signed)
Pre visit review using our clinic review tool, if applicable. No additional management support is needed unless otherwise documented below in the visit note. 

## 2016-06-24 NOTE — Assessment & Plan Note (Signed)
Intermittant , nml neuro exam.  No other secondary cause for vertigo seen.  Recommended home desensitization exercises.

## 2016-06-24 NOTE — Patient Instructions (Addendum)
Follow Blood pressure at home.. Call with measurement in 1-2 weeks.. We will need to restart lisinopril if > 140/90 consistently. Follow blood sugars at home, call if remaining > 200. Work on low carb.  Dizziness likely from vertigo: BPPV .  Start home desensitization exercises if continuing.    Benign Positional Vertigo Vertigo is the feeling that you or your surroundings are moving when they are not. Benign positional vertigo is the most common form of vertigo. The cause of this condition is not serious (is benign). This condition is triggered by certain movements and positions (is positional). This condition can be dangerous if it occurs while you are doing something that could endanger you or others, such as driving.  CAUSES In many cases, the cause of this condition is not known. It may be caused by a disturbance in an area of the inner ear that helps your brain to sense movement and balance. This disturbance can be caused by a viral infection (labyrinthitis), head injury, or repetitive motion. RISK FACTORS This condition is more likely to develop in:  Women.  People who are 80 years of age or older. SYMPTOMS Symptoms of this condition usually happen when you move your head or your eyes in different directions. Symptoms may start suddenly, and they usually last for less than a minute. Symptoms may include:  Loss of balance and falling.  Feeling like you are spinning or moving.  Feeling like your surroundings are spinning or moving.  Nausea and vomiting.  Blurred vision.  Dizziness.  Involuntary eye movement (nystagmus). Symptoms can be mild and cause only slight annoyance, or they can be severe and interfere with daily life. Episodes of benign positional vertigo may return (recur) over time, and they may be triggered by certain movements. Symptoms may improve over time. DIAGNOSIS This condition is usually diagnosed by medical history and a physical exam of the head, neck, and  ears. You may be referred to a health care provider who specializes in ear, nose, and throat (ENT) problems (otolaryngologist) or a provider who specializes in disorders of the nervous system (neurologist). You may have additional testing, including:  MRI.  A CT scan.  Eye movement tests. Your health care provider may ask you to change positions quickly while he or she watches you for symptoms of benign positional vertigo, such as nystagmus. Eye movement may be tested with an electronystagmogram (ENG), caloric stimulation, the Dix-Hallpike test, or the roll test.  An electroencephalogram (EEG). This records electrical activity in your brain.  Hearing tests. TREATMENT Usually, your health care provider will treat this by moving your head in specific positions to adjust your inner ear back to normal. Surgery may be needed in severe cases, but this is rare. In some cases, benign positional vertigo may resolve on its own in 2-4 weeks. HOME CARE INSTRUCTIONS Safety  Move slowly.Avoid sudden body or head movements.  Avoid driving.  Avoid operating heavy machinery.  Avoid doing any tasks that would be dangerous to you or others if a vertigo episode would occur.  If you have trouble walking or keeping your balance, try using a cane for stability. If you feel dizzy or unstable, sit down right away.  Return to your normal activities as told by your health care provider. Ask your health care provider what activities are safe for you. General Instructions  Take over-the-counter and prescription medicines only as told by your health care provider.  Avoid certain positions or movements as told by your health care  provider.  Drink enough fluid to keep your urine clear or pale yellow.  Keep all follow-up visits as told by your health care provider. This is important. SEEK MEDICAL CARE IF:  You have a fever.  Your condition gets worse or you develop new symptoms.  Your family or friends  notice any behavioral changes.  Your nausea or vomiting gets worse.  You have numbness or a "pins and needles" sensation. SEEK IMMEDIATE MEDICAL CARE IF:  You have difficulty speaking or moving.  You are always dizzy.  You faint.  You develop severe headaches.  You have weakness in your legs or arms.  You have changes in your hearing or vision.  You develop a stiff neck.  You develop sensitivity to light.   This information is not intended to replace advice given to you by your health care provider. Make sure you discuss any questions you have with your health care provider.   Document Released: 05/16/2006 Document Revised: 04/29/2015 Document Reviewed: 12/01/2014 Elsevier Interactive Patient Education [employer].

## 2016-06-24 NOTE — Assessment & Plan Note (Signed)
Not associated with episode of vertigo. Tripped. No proceeding symptoms. Mild knee pain likely from bone bruise. No further work up needed. Recommneded home safety measures to avoid fall.

## 2016-06-24 NOTE — Progress Notes (Signed)
Subjective:    Patient ID: Jill [name], female    DOB: [birth year], 82 y.o.   MRN: [id number]  HPI   82 year old female with hx of diabetes, major depression, HTN, past renal abscess and chronic fatigue presents with new onset dizziness off  and on. 2 episodes of room spinning. One episode occurred when sitting up from bed.  Last several seconds. Did not check BP or sugar at that time.No N/V.  Last epsiode 4 days ago.  No fever. No chest pain no associated palpitations.  No SOB. No headaches.  No recent allergy or cold symptoms. No new supplements.  Unrelated she lost balance and fell on  Right side, mild soreness in right knee ann shoulder, Slight bruising on elbow.  Recently she had had low CBG in 02/2016.Marland Kitchen Glipizide stopped. CBG has been good 100-150 until this AM 215. Lab Results  Component Value Date   HGBA1C 7.6 (H) 03/21/2016   HTN poor control today. Not checking at home. BP Readings from Last 3 Encounters:  06/24/16 (!) 154/80  05/17/16 (!) 156/82  04/26/16 (!) 182/84   Grandaughter notes on leaving that she has had some memory issue in last few months... Will eval at next OV, with labs  And MMSE.  Review of Systems  Constitutional: Negative for fatigue and fever.  HENT: Negative for ear pain.   Eyes: Negative for pain.  Respiratory: Negative for chest tightness and shortness of breath.   Cardiovascular: Negative for chest pain, palpitations and leg swelling.  Gastrointestinal: Negative for abdominal pain.  Genitourinary: Negative for dysuria.       Objective:   Physical Exam  Constitutional: She is oriented to person, place, and time. Vital signs are normal. She appears well-developed and well-nourished. She is cooperative.  Non-toxic appearance. She does not appear ill. No distress.  Elderly female in NAD  HENT:  Head: Normocephalic.  Right Ear: Hearing, tympanic membrane, external ear and ear canal normal. Tympanic membrane is not erythematous, not  retracted and not bulging.  Left Ear: Hearing, tympanic membrane, external ear and ear canal normal. Tympanic membrane is not erythematous, not retracted and not bulging.  Nose: No mucosal edema or rhinorrhea. Right sinus exhibits no maxillary sinus tenderness and no frontal sinus tenderness. Left sinus exhibits no maxillary sinus tenderness and no frontal sinus tenderness.  Mouth/Throat: Uvula is midline, oropharynx is clear and moist and mucous membranes are normal.  Eyes: Conjunctivae, EOM and lids are normal. Pupils are equal, round, and reactive to light. Lids are everted and swept, no foreign bodies found.  Neck: Trachea normal and normal range of motion. Neck supple. Carotid bruit is not present. No thyroid mass and no thyromegaly present.  Cardiovascular: Normal rate, regular rhythm, S1 normal, S2 normal, normal heart sounds, intact distal pulses and normal pulses.  Exam reveals no gallop and no friction rub.   No murmur heard. Pulmonary/Chest: Effort normal and breath sounds normal. No tachypnea. No respiratory distress. She has no decreased breath sounds. She has no wheezes. She has no rhonchi. She has no rales.  Abdominal: Soft. Normal appearance and bowel sounds are normal. There is no tenderness.  Musculoskeletal:       Cervical back: She exhibits decreased range of motion, tenderness and bony tenderness.  Neurological: She is alert and oriented to person, place, and time. She has normal strength and normal reflexes. No cranial nerve deficit or sensory deficit. She exhibits normal muscle tone. She displays a negative Romberg sign. Gait  abnormal. Coordination normal. GCS eye subscore is 4. GCS verbal subscore is 5. GCS motor subscore is 6.  Nml cerebellar exam   No papilledema   Slowed gait.   Unable to perform [employer].  Skin: Skin is warm, dry and intact. No rash noted.  Psychiatric: She has a normal mood and affect. Her speech is normal and behavior is normal. Judgment and  thought content normal. Her mood appears not anxious. Cognition and memory are normal. Cognition and memory are not impaired. She does not exhibit a depressed mood. She exhibits normal recent memory and normal remote memory.          Assessment & Plan:

## 2016-06-24 NOTE — Assessment & Plan Note (Signed)
Was well controlled off med.. Now with recent increase. Not clearly related to vertigo. Work on low [employer]. Follow up with labs in 2 weeks.

## 2016-06-27 ENCOUNTER — Other Ambulatory Visit: Payer: Self-pay | Admitting: Family Medicine

## 2016-06-30 ENCOUNTER — Other Ambulatory Visit: Payer: Medicare Other

## 2016-06-30 ENCOUNTER — Ambulatory Visit (INDEPENDENT_AMBULATORY_CARE_PROVIDER_SITE_OTHER): Payer: Medicare Other

## 2016-06-30 VITALS — BP 124/80 | HR 86 | Temp 97.9°F | Ht 61.5 in | Wt 185.8 lb

## 2016-06-30 DIAGNOSIS — Z0000 Encounter for general adult medical examination without abnormal findings: Secondary | ICD-10-CM

## 2016-06-30 DIAGNOSIS — R413 Other amnesia: Secondary | ICD-10-CM

## 2016-06-30 DIAGNOSIS — E1343 Other specified diabetes mellitus with diabetic autonomic (poly)neuropathy: Secondary | ICD-10-CM | POA: Diagnosis not present

## 2016-06-30 DIAGNOSIS — E0843 Diabetes mellitus due to underlying condition with diabetic autonomic (poly)neuropathy: Secondary | ICD-10-CM

## 2016-06-30 LAB — COMPREHENSIVE METABOLIC PANEL
ALBUMIN: 3.5 g/dL (ref 3.5–5.2)
ALT: 14 U/L (ref 0–35)
AST: 22 U/L (ref 0–37)
Alkaline Phosphatase: 57 U/L (ref 39–117)
BUN: 28 mg/dL — AB (ref 6–23)
CHLORIDE: 103 meq/L (ref 96–112)
CO2: 30 meq/L (ref 19–32)
CREATININE: 1.69 mg/dL — AB (ref 0.40–1.20)
Calcium: 9.4 mg/dL (ref 8.4–10.5)
GFR: 30.76 mL/min — ABNORMAL LOW (ref >60.00)
GLUCOSE: 161 mg/dL — AB (ref 70–99)
POTASSIUM: 4.8 meq/L (ref 3.5–5.1)
SODIUM: 138 meq/L (ref 135–145)
Total Bilirubin: 0.4 mg/dL (ref 0.2–1.2)
Total Protein: 6.5 g/dL (ref 6.0–8.3)

## 2016-06-30 LAB — CBC WITH DIFFERENTIAL/PLATELET
BASOS ABS: 0 10*3/uL (ref 0.0–0.1)
Basophils Relative: 0.5 % (ref 0.0–3.0)
EOS ABS: 0.2 10*3/uL (ref 0.0–0.7)
Eosinophils Relative: 2.5 % (ref 0.0–5.0)
HCT: 36.2 % (ref 36.0–46.0)
Hemoglobin: 11.9 g/dL — ABNORMAL LOW (ref 12.0–15.0)
LYMPHS ABS: 3 10*3/uL (ref 0.7–4.0)
Lymphocytes Relative: 30.5 % (ref 12.0–46.0)
MCHC: 32.8 g/dL (ref 30.0–36.0)
MCV: 89 fl (ref 78.0–100.0)
MONO ABS: 0.9 10*3/uL (ref 0.1–1.0)
Monocytes Relative: 8.8 % (ref 3.0–12.0)
NEUTROS ABS: 5.6 10*3/uL (ref 1.4–7.7)
NEUTROS PCT: 57.7 % (ref 43.0–77.0)
PLATELETS: 334 10*3/uL (ref 150.0–400.0)
RBC: 4.06 Mil/uL (ref 3.87–5.11)
RDW: 14.1 % (ref 11.5–15.5)
WBC: 9.7 10*3/uL (ref 4.0–10.5)

## 2016-06-30 LAB — MICROALBUMIN / CREATININE URINE RATIO
CREATININE, U: 186.5 mg/dL
MICROALB UR: 5.6 mg/dL — AB (ref 0.0–1.9)
MICROALB/CREAT RATIO: 3 mg/g (ref 0.0–30.0)

## 2016-06-30 LAB — LIPID PANEL
CHOL/HDL RATIO: 4
Cholesterol: 222 mg/dL — ABNORMAL HIGH (ref 0–200)
HDL: 59.7 mg/dL (ref >39.00)
NONHDL: 162.78
TRIGLYCERIDES: 210 mg/dL — AB (ref 0.0–149.0)
VLDL: 42 mg/dL — ABNORMAL HIGH (ref 0.0–40.0)

## 2016-06-30 LAB — TSH: TSH: 1.65 u[IU]/mL (ref 0.35–4.50)

## 2016-06-30 LAB — T4, FREE: FREE T4: 0.95 ng/dL (ref 0.60–1.60)

## 2016-06-30 LAB — LDL CHOLESTEROL, DIRECT: LDL DIRECT: 143 mg/dL

## 2016-06-30 LAB — HEMOGLOBIN A1C: Hgb A1c MFr Bld: 7.8 % — ABNORMAL HIGH (ref 4.6–6.5)

## 2016-06-30 LAB — VITAMIN D 25 HYDROXY (VIT D DEFICIENCY, FRACTURES): VITD: 28.3 ng/mL — ABNORMAL LOW (ref 30.00–100.00)

## 2016-06-30 LAB — T3, FREE: T3 FREE: 3 pg/mL (ref 2.3–4.2)

## 2016-06-30 LAB — VITAMIN B12: VITAMIN B 12: 958 pg/mL — AB (ref 211–911)

## 2016-06-30 NOTE — Progress Notes (Signed)
Subjective:   Jill [name] is a 82 y.o. female who presents for Medicare Annual (Subsequent) preventive examination.  Review of Systems:  N/A Cardiac Risk Factors include: advanced age (>39men, >67 women);obesity (BMI >30kg/m2);dyslipidemia;hypertension     Objective:     Vitals: BP 124/80 (BP Location: Left Arm, Patient Position: Sitting, Cuff Size: Normal)   Pulse 86   Temp 97.9 F (36.6 C) (Oral)   Ht 5' 1.5" (1.562 m) Comment: no shoes  Wt 185 lb 12 oz (84.3 kg)   SpO2 97%   BMI 34.53 kg/m   Body mass index is 34.53 kg/m.   Tobacco History  Smoking Status  . Former Smoker  . Packs/day: 1.00  . Years: 45.00  . Types: Cigarettes  . Quit date: 06/16/1999  Smokeless Tobacco  . Never Used    Comment: quit 15 years ago     Counseling given: No   Past Medical History:  Diagnosis Date  . Allergy   . Arthritis    osteoarthritis   . Chronic kidney disease   . Complication of anesthesia    difficult waking   . Depression   . Diabetes mellitus   . Diverticula, colon   . Family history of anesthesia complication    Son is difficult intubation  . Hyperlipidemia   . Hypertension   . Vasovagal syncope 2006   Negative cardiac workup-myoview, echo   Past Surgical History:  Procedure Laterality Date  . ABDOMINAL HYSTERECTOMY  1977   fibroid  . JOINT REPLACEMENT  2009   rt hip  . TOTAL HIP ARTHROPLASTY Left 05/12/2014   dr Mayer Camel  . TOTAL HIP ARTHROPLASTY Left 05/12/2014   Procedure: TOTAL HIP ARTHROPLASTY;  Surgeon: Kerin Salen, MD;  Location: [facility];  Service: Orthopedics;  Laterality: Left;   Family History  Problem Relation Age of Onset  . COPD Brother   . Heart disease Brother   . Diabetes Brother   . Lymphoma Brother   . Alzheimer's disease Brother   . Pancreatic cancer Sister   . Alzheimer's disease Sister   . Heart disease Mother   . Breast cancer Sister   . Leukemia Other   . Kidney disease Neg Hx    History  Sexual Activity  . Sexual  activity: Not Currently    Outpatient Encounter Prescriptions as of 06/30/2016  Medication Sig  . ALPRAZolam (XANAX) 0.25 MG tablet Take 1 tablet (0.25 mg total) by mouth 2 (two) times daily.  Marland Kitchen aspirin EC 325 MG tablet Take 1 tablet (325 mg total) by mouth 2 (two) times daily. (Patient taking differently: Take 325 mg by mouth daily. )  . Cholecalciferol 1000 UNITS capsule Take 1,000 Units by mouth daily. Reported on 01/19/2016  . conjugated estrogens (PREMARIN) vaginal cream Place 1 Applicatorful vaginally daily. Apply 0.5mg  (pea-sized amount)  just inside the vaginal introitus with a finger-tip every night for two weeks and then Monday, Wednesday and Friday nights.  . escitalopram (LEXAPRO) 20 MG tablet TAKE 1 TABLET BY MOUTH DAILY  . estradiol (ESTRACE VAGINAL) 0.1 MG/GM vaginal cream Apply 0.5mg  (pea-sized amount)  just inside the vaginal introitus with a finger-tip every night for two weeks and then Monday, Wednesday and Friday nights.  . fenofibrate 54 MG tablet TAKE TWO (2) TABLETS BY MOUTH DAILY  . furosemide (LASIX) 20 MG tablet Take 1 tablet (20 mg total) by mouth daily. (Patient taking differently: Take 20 mg by mouth as needed. )   No facility-administered encounter medications on  file as of 06/30/2016.     Activities of Daily Living In your present state of health, do you have any difficulty performing the following activities: 06/30/2016  Hearing? N  Vision? N  Difficulty concentrating or making decisions? Y  Walking or climbing stairs? Y  Dressing or bathing? N  Doing errands, shopping? N  Preparing Food and eating ? N  Using the Toilet? N  In the past six months, have you accidently leaked urine? N  Do you have problems with loss of bowel control? N  Managing your Medications? N  Managing your Finances? N  Housekeeping or managing your Housekeeping? N  Some recent data might be hidden    Patient Care Team: Jinny Sanders, MD as PCP - General (Family Medicine)      Assessment:     Hearing Screening   125Hz  250Hz  500Hz  1000Hz  2000Hz  3000Hz  4000Hz  6000Hz  8000Hz   Right ear:   0 0 0  0    Left ear:   0 0 40  0    Vision Screening Comments: Last vision exam in Sept 2017 with Dr. Satira Sark at [facility]   Exercise Activities and Dietary recommendations Current Exercise Habits: The patient does not participate in regular exercise at present, Exercise limited by: orthopedic condition(s);Other - see comments (unsteady gait/balance)  Goals    . Eat more fruits and vegetables          Starting 06/30/2016, I will increase walking once I get a rollator and attempt to eat at least 4-5 svgs daily of fresh fruits and vegetables.       Fall Risk Fall Risk  06/30/2016 06/02/2015 02/20/2014  Falls in the past year? Yes No Yes  Number falls in past yr: 1 - 2 or more  Injury with Fall? Yes - -  Risk Factor Category  - - High Fall Risk  Risk for fall due to : Impaired balance/gait - History of fall(s)  Follow up Falls evaluation completed - -   Depression Screen PHQ 2/9 Scores 06/30/2016 08/21/2015 06/02/2015 01/17/2015  PHQ - 2 Score 0 2 0 0  PHQ- 9 Score - 8 - -     Cognitive Function MMSE - Mini Mental State Exam 06/30/2016  Orientation to time 5  Orientation to Place 5  Registration 3  Attention/ Calculation 0  Recall 3  Language- name 2 objects 0  Language- repeat 1  Language- follow 3 step command 3  Language- read & follow direction 0  Write a sentence 0  Copy design 0  Total score 20     PLEASE NOTE: A Mini-Cog screen was completed. Maximum score is 20. A value of 0 denotes this part of Folstein MMSE was not completed or the patient failed this part of the Mini-Cog screening.   Mini-Cog Screening Orientation to Time - Max 5 pts Orientation to Place - Max 5 pts Registration - Max 3 pts Recall - Max 3 pts Language Repeat - Max 1 pts Language Follow 3 Step Command - Max 3 pts     Immunization History  Administered Date(s)  Administered  . H1N1 08/18/2008  . Influenza Whole 06/16/2007  . Influenza,inj,Quad PF,36+ Mos 05/14/2014, 05/05/2015, 06/24/2016  . Influenza-Unspecified 06/22/2013  . Pneumococcal Conjugate-13 02/20/2014  . Pneumococcal Polysaccharide-23 01/20/1997, 06/24/2016  . Td 02/20/2003  . Tdap 02/20/2014   Screening Tests Health Maintenance  Topic Date Due  . HEMOGLOBIN A1C  09/21/2016  . OPHTHALMOLOGY EXAM  01/27/2017  . FOOT EXAM  03/18/2017  .  URINE MICROALBUMIN  06/30/2017  . TETANUS/TDAP  02/21/2024  . INFLUENZA VACCINE  Addressed  . DEXA SCAN  Completed  . ZOSTAVAX  Addressed  . PNA vac Low Risk Adult  Completed      Plan:     I have personally reviewed and addressed the Medicare Annual Wellness questionnaire and have noted the following in the patient's chart:  A. Medical and social history B. Use of alcohol, tobacco or illicit drugs  C. Current medications and supplements D. Functional ability and status E.  Nutritional status F.  Physical activity G. Advance directives H. List of other physicians I.  Hospitalizations, surgeries, and ER visits in previous 12 months J.  [place] to include hearing, vision, cognitive, depression L. Referrals and appointments - none  In addition, I have reviewed and discussed with patient certain preventive protocols, quality metrics, and best practice recommendations. A written personalized care plan for preventive services as well as general preventive health recommendations were provided to patient.  See attached scanned questionnaire for additional information.   Signed,   Lindell Noe, MHA, BS, LPN Health Coach

## 2016-06-30 NOTE — Patient Instructions (Signed)
Jill [name] , Thank you for taking time to come for your Medicare Wellness Visit. I appreciate your ongoing commitment to your health goals. Please review the following plan we discussed and let me know if I can assist you in the future.   These are the goals we discussed: Goals    . Eat more fruits and vegetables          Starting 06/30/2016, I will increase walking once I get rollator and attempt to eat at least 4-5 svgs daily of fresh fruits and vegetables.        This is a list of the screening recommended for you and due dates:  Health Maintenance  Topic Date Due  . Hemoglobin A1C  09/21/2016  . Eye exam for diabetics  01/27/2017  . Complete foot exam   03/18/2017  . Urine Protein Check  06/30/2017  . Tetanus Vaccine  02/21/2024  . Flu Shot  Addressed  . DEXA scan (bone density measurement)  Completed  . Shingles Vaccine  Addressed  . Pneumonia vaccines  Completed   Preventive Care for Adults  A healthy lifestyle and preventive care can promote health and wellness. Preventive health guidelines for adults include the following key practices.  . A routine yearly physical is a good way to check with your health care provider about your health and preventive screening. It is a chance to share any concerns and updates on your health and to receive a thorough exam.  . Visit your dentist for a routine exam and preventive care every 6 months. Brush your teeth twice a day and floss once a day. Good oral hygiene prevents tooth decay and gum disease.  . The frequency of eye exams is based on your age, health, family medical history, use  of contact lenses, and other factors. Follow your health care provider's ecommendations for frequency of eye exams.  . Eat a healthy diet. Foods like vegetables, fruits, whole grains, low-fat dairy products, and lean protein foods contain the nutrients you need without too many calories. Decrease your intake of foods high in solid fats, added sugars, and  salt. Eat the right amount of calories for you. Get information about a proper diet from your health care provider, if necessary.  . Regular physical exercise is one of the most important things you can do for your health. Most adults should get at least 150 minutes of moderate-intensity exercise (any activity that increases your heart rate and causes you to sweat) each week. In addition, most adults need muscle-strengthening exercises on 2 or more days a week.  Silver Sneakers may be a benefit available to you. To determine eligibility, you may visit the website: www.silversneakers.com or contact program at 218-370-7177 Mon-Fri between 8AM-8PM.   . Maintain a healthy weight. The body mass index (BMI) is a screening tool to identify possible weight problems. It provides an estimate of body fat based on height and weight. Your health care provider can find your BMI and can help you achieve or maintain a healthy weight.   For adults 20 years and older: ? A BMI below 18.5 is considered underweight. ? A BMI of 18.5 to 24.9 is normal. ? A BMI of 25 to 29.9 is considered overweight. ? A BMI of 30 and above is considered obese.   . Maintain normal blood lipids and cholesterol levels by exercising and minimizing your intake of saturated fat. Eat a balanced diet with plenty of fruit and vegetables. Blood tests for lipids and  cholesterol should begin at age 71 and be repeated every 5 years. If your lipid or cholesterol levels are high, you are over 50, or you are at high risk for heart disease, you may need your cholesterol levels checked more frequently. Ongoing high lipid and cholesterol levels should be treated with medicines if diet and exercise are not working.  . If you smoke, find out from your health care provider how to quit. If you do not use tobacco, please do not start.  . If you choose to drink alcohol, please do not consume more than 2 drinks per day. One drink is considered to be 12 ounces  (355 mL) of beer, 5 ounces (148 mL) of wine, or 1.5 ounces (44 mL) of liquor.  . If you are 68-48 years old, ask your health care provider if you should take aspirin to prevent strokes.  . Use sunscreen. Apply sunscreen liberally and repeatedly throughout the day. You should seek shade when your shadow is shorter than you. Protect yourself by wearing long sleeves, pants, a wide-brimmed hat, and sunglasses year round, whenever you are outdoors.  . Once a month, do a whole body skin exam, using a mirror to look at the skin on your back. Tell your health care provider of new moles, moles that have irregular borders, moles that are larger than a pencil eraser, or moles that have changed in shape or color.

## 2016-06-30 NOTE — Progress Notes (Signed)
PCP notes:   Health maintenance:  Urine microalbumin - completed; results in EMR  Abnormal screenings:   Hearing -failed Fall risk - hx of fall with injury  Patient concerns:   None  Nurse concerns:  None  Next PCP appt:   07/05/16 @ 0915

## 2016-06-30 NOTE — Progress Notes (Signed)
Pre visit review using our clinic review tool, if applicable. No additional management support is needed unless otherwise documented below in the visit note. 

## 2016-07-01 NOTE — Progress Notes (Signed)
I reviewed health advisor's note, was available for consultation, and agree with documentation and plan.   Signed,  Waleska Buttery T. Eithan Beagle, MD  

## 2016-07-05 ENCOUNTER — Encounter: Payer: Self-pay | Admitting: Family Medicine

## 2016-07-05 ENCOUNTER — Ambulatory Visit (INDEPENDENT_AMBULATORY_CARE_PROVIDER_SITE_OTHER): Payer: Medicare Other | Admitting: Family Medicine

## 2016-07-05 VITALS — BP 138/70 | HR 88 | Temp 97.6°F | Ht 61.5 in | Wt 187.5 lb

## 2016-07-05 DIAGNOSIS — E0843 Diabetes mellitus due to underlying condition with diabetic autonomic (poly)neuropathy: Secondary | ICD-10-CM | POA: Diagnosis not present

## 2016-07-05 DIAGNOSIS — R5382 Chronic fatigue, unspecified: Secondary | ICD-10-CM

## 2016-07-05 DIAGNOSIS — I10 Essential (primary) hypertension: Secondary | ICD-10-CM | POA: Diagnosis not present

## 2016-07-05 DIAGNOSIS — E782 Mixed hyperlipidemia: Secondary | ICD-10-CM

## 2016-07-05 DIAGNOSIS — R413 Other amnesia: Secondary | ICD-10-CM

## 2016-07-05 DIAGNOSIS — E559 Vitamin D deficiency, unspecified: Secondary | ICD-10-CM

## 2016-07-05 DIAGNOSIS — H8110 Benign paroxysmal vertigo, unspecified ear: Secondary | ICD-10-CM

## 2016-07-05 MED ORDER — VITAMIN D3 1.25 MG (50000 UT) PO CAPS: ORAL_CAPSULE | ORAL | 0 refills | Status: DC

## 2016-07-05 NOTE — Assessment & Plan Note (Signed)
Well controlled. Continue current medication.  

## 2016-07-05 NOTE — Assessment & Plan Note (Signed)
Lab eval nml.. Except vit D.

## 2016-07-05 NOTE — Assessment & Plan Note (Signed)
Trial of flonase for possible fluifd in middle ear.  If not improving sent to PT balance retraining.

## 2016-07-05 NOTE — Progress Notes (Signed)
Subjective:    Patient ID: Jill [name], female    DOB: 1931/09/12, 82 y.o.   MRN: [id number]  HPI   82 year old female presents with her daughter for follow up multiple issues.  At last OV on 11/3:  Dx with BPPV treated with home desensitization exercises  grandaughter noted memory issues x several months gradaul onset.  She had labs done showing negative TSH,   stable CKD, nml b12, moderate control of DM, low vit D and slight anemia.  Cholesterol was  Poorly controlled with LDL > 100.  Intolerant of statin in past. Previously hesitant about zetia. On fenofibrate.    Today she reports  She continues to have intermittent vertigo. Occurs  In morning when getting up, when rolling over. Room spinning.  No falls. She had sinus pressure this AM .. Sinus pressure in left face resolved with Excedrin.  in morning blowing nose, clear. No fever. No ear pain. Has some congestion in fall yearly.  Getting rollator walker.  She had been doing home exercises.   Her daughter and pt report some decrease in memory over last year, not in   Decreased memory for recall of names, day prior. Long term memory is good. Has not gotten lost in car.. Only drives short distances.  Minicog: 20/20 at [facility].  In office today 16 animal in 30 secs.    Social History /Family History/Past Medical History reviewed and updated if needed.     :   Review of Systems  Constitutional: Positive for fatigue. Negative for fever.  HENT: Negative for ear pain.   Eyes: Negative for pain.  Respiratory: Negative for chest tightness and shortness of breath.   Cardiovascular: Negative for chest pain, palpitations and leg swelling.  Gastrointestinal: Negative for abdominal pain.  Genitourinary: Negative for dysuria.  Musculoskeletal: Positive for back pain.  Neurological: Positive for dizziness and weakness.       Objective:   Physical Exam  Constitutional: She is oriented to person, place, and time. Vital signs  are normal. She appears well-developed and well-nourished. She is cooperative.  Non-toxic appearance. She does not appear ill. No distress.  HENT:  Head: Normocephalic.  Right Ear: Hearing, external ear and ear canal normal. Tympanic membrane is not erythematous, not retracted and not bulging. A middle ear effusion is present.  Left Ear: Hearing, tympanic membrane, external ear and ear canal normal. Tympanic membrane is not erythematous, not retracted and not bulging.  Nose: No mucosal edema or rhinorrhea. Right sinus exhibits no maxillary sinus tenderness and no frontal sinus tenderness. Left sinus exhibits no maxillary sinus tenderness and no frontal sinus tenderness.  Mouth/Throat: Uvula is midline, oropharynx is clear and moist and mucous membranes are normal.  Eyes: Conjunctivae, EOM and lids are normal. Pupils are equal, round, and reactive to light. Lids are everted and swept, no foreign bodies found.  Neck: Trachea normal and normal range of motion. Neck supple. Carotid bruit is not present. No thyroid mass and no thyromegaly present.  Cardiovascular: Normal rate, regular rhythm, S1 normal, S2 normal, normal heart sounds, intact distal pulses and normal pulses.  Exam reveals no gallop and no friction rub.   No murmur heard. Pulmonary/Chest: Effort normal and breath sounds normal. No tachypnea. No respiratory distress. She has no decreased breath sounds. She has no wheezes. She has no rhonchi. She has no rales.  Abdominal: Soft. Normal appearance and bowel sounds are normal. There is no tenderness.  Musculoskeletal:  Lumbar back: She exhibits normal range of motion, no tenderness and no bony tenderness.  Neurological: She is alert and oriented to person, place, and time. She has normal strength and normal reflexes. No cranial nerve deficit or sensory deficit. She exhibits normal muscle tone. She displays a negative Romberg sign. Coordination and gait normal. GCS eye subscore is 4. GCS  verbal subscore is 5. GCS motor subscore is 6.  Nml cerebellar exam   No papilledema  Skin: Skin is warm, dry and intact. No rash noted.  Psychiatric: She has a normal mood and affect. Her speech is normal and behavior is normal. Judgment and thought content normal. Her mood appears not anxious. Cognition and memory are normal. Cognition and memory are not impaired. She does not exhibit a depressed mood. She exhibits normal recent memory and normal remote memory.          Assessment & Plan:

## 2016-07-05 NOTE — Assessment & Plan Note (Signed)
Add red yeast rice.

## 2016-07-05 NOTE — Progress Notes (Signed)
Pre visit review using our clinic review tool, if applicable. No additional management support is needed unless otherwise documented below in the visit note. 

## 2016-07-05 NOTE — Patient Instructions (Addendum)
Start a 12 week course of vit D. Once it is completed, return to OTC vit D daily.  Start red yeast rice OTC 600 mg cap .. 2 cap twice daily, but  Increase to that dose slowly.  Work on low [employer].. Stop juice, sweet tea.   Trial of flonase 2 sprays per nostril daily x 2 weeks.  Call if vertigo not improving for referral to PT balance retraining.  Work on increasing exercising. Start low back stretching.

## 2016-07-05 NOTE — Assessment & Plan Note (Signed)
Nml animal recall and minicog.

## 2016-07-05 NOTE — Assessment & Plan Note (Signed)
Stop OJ an sweet tea. May be able to control off med. If not at goal next OV in 3 months.. Start medication.. See past trials. Metformin contraindicated with kidney function.

## 2016-07-05 NOTE — Assessment & Plan Note (Signed)
Start a 12 week course of vit D. Once it is completed, return to OTC vit D daily.

## 2016-08-08 ENCOUNTER — Emergency Department (HOSPITAL_COMMUNITY): Payer: Medicare Other

## 2016-08-08 ENCOUNTER — Observation Stay (HOSPITAL_COMMUNITY): Payer: Medicare Other

## 2016-08-08 ENCOUNTER — Observation Stay (HOSPITAL_COMMUNITY)
Admission: EM | Admit: 2016-08-08 | Discharge: 2016-08-09 | Disposition: A | Discharge status: Home / Self Care | Payer: Medicare Other | Attending: Internal Medicine | Admitting: Internal Medicine

## 2016-08-08 ENCOUNTER — Encounter (HOSPITAL_COMMUNITY): Payer: Self-pay

## 2016-08-08 ENCOUNTER — Telehealth: Payer: Self-pay | Admitting: Family Medicine

## 2016-08-08 DIAGNOSIS — F419 Anxiety disorder, unspecified: Secondary | ICD-10-CM | POA: Diagnosis not present

## 2016-08-08 DIAGNOSIS — S06351A Traumatic hemorrhage of left cerebrum with loss of consciousness of 30 minutes or less, initial encounter: Secondary | ICD-10-CM

## 2016-08-08 DIAGNOSIS — M25562 Pain in left knee: Secondary | ICD-10-CM | POA: Insufficient documentation

## 2016-08-08 DIAGNOSIS — H538 Other visual disturbances: Secondary | ICD-10-CM | POA: Diagnosis not present

## 2016-08-08 DIAGNOSIS — S065X9A Traumatic subdural hemorrhage with loss of consciousness of unspecified duration, initial encounter: Secondary | ICD-10-CM

## 2016-08-08 DIAGNOSIS — S6990XA Unspecified injury of unspecified wrist, hand and finger(s), initial encounter: Secondary | ICD-10-CM

## 2016-08-08 DIAGNOSIS — N183 Chronic kidney disease, stage 3 unspecified: Secondary | ICD-10-CM

## 2016-08-08 DIAGNOSIS — E669 Obesity, unspecified: Secondary | ICD-10-CM | POA: Diagnosis not present

## 2016-08-08 DIAGNOSIS — I5032 Chronic diastolic (congestive) heart failure: Secondary | ICD-10-CM | POA: Diagnosis not present

## 2016-08-08 DIAGNOSIS — S8391XA Sprain of unspecified site of right knee, initial encounter: Secondary | ICD-10-CM

## 2016-08-08 DIAGNOSIS — Y998 Other external cause status: Secondary | ICD-10-CM | POA: Diagnosis not present

## 2016-08-08 DIAGNOSIS — I130 Hypertensive heart and chronic kidney disease with heart failure and stage 1 through stage 4 chronic kidney disease, or unspecified chronic kidney disease: Secondary | ICD-10-CM | POA: Diagnosis not present

## 2016-08-08 DIAGNOSIS — E1122 Type 2 diabetes mellitus with diabetic chronic kidney disease: Secondary | ICD-10-CM | POA: Diagnosis not present

## 2016-08-08 DIAGNOSIS — Z882 Allergy status to sulfonamides status: Secondary | ICD-10-CM | POA: Insufficient documentation

## 2016-08-08 DIAGNOSIS — G9389 Other specified disorders of brain: Secondary | ICD-10-CM | POA: Insufficient documentation

## 2016-08-08 DIAGNOSIS — Z885 Allergy status to narcotic agent status: Secondary | ICD-10-CM | POA: Diagnosis not present

## 2016-08-08 DIAGNOSIS — M8580 Other specified disorders of bone density and structure, unspecified site: Secondary | ICD-10-CM | POA: Insufficient documentation

## 2016-08-08 DIAGNOSIS — S06321A Contusion and laceration of left cerebrum with loss of consciousness of 30 minutes or less, initial encounter: Secondary | ICD-10-CM

## 2016-08-08 DIAGNOSIS — I700 Atherosclerosis of aorta: Secondary | ICD-10-CM | POA: Diagnosis not present

## 2016-08-08 DIAGNOSIS — Y9301 Activity, walking, marching and hiking: Secondary | ICD-10-CM | POA: Insufficient documentation

## 2016-08-08 DIAGNOSIS — Z96643 Presence of artificial hip joint, bilateral: Secondary | ICD-10-CM | POA: Insufficient documentation

## 2016-08-08 DIAGNOSIS — S062X0A Diffuse traumatic brain injury without loss of consciousness, initial encounter: Principal | ICD-10-CM | POA: Insufficient documentation

## 2016-08-08 DIAGNOSIS — W1809XA Striking against other object with subsequent fall, initial encounter: Secondary | ICD-10-CM | POA: Insufficient documentation

## 2016-08-08 DIAGNOSIS — E118 Type 2 diabetes mellitus with unspecified complications: Secondary | ICD-10-CM | POA: Diagnosis not present

## 2016-08-08 DIAGNOSIS — F329 Major depressive disorder, single episode, unspecified: Secondary | ICD-10-CM | POA: Diagnosis not present

## 2016-08-08 DIAGNOSIS — E1143 Type 2 diabetes mellitus with diabetic autonomic (poly)neuropathy: Secondary | ICD-10-CM | POA: Insufficient documentation

## 2016-08-08 DIAGNOSIS — Z7982 Long term (current) use of aspirin: Secondary | ICD-10-CM | POA: Insufficient documentation

## 2016-08-08 DIAGNOSIS — M25331 Other instability, right wrist: Secondary | ICD-10-CM

## 2016-08-08 DIAGNOSIS — Z6834 Body mass index (BMI) 34.0-34.9, adult: Secondary | ICD-10-CM | POA: Insufficient documentation

## 2016-08-08 DIAGNOSIS — S06360A Traumatic hemorrhage of cerebrum, unspecified, without loss of consciousness, initial encounter: Secondary | ICD-10-CM

## 2016-08-08 DIAGNOSIS — S8001XA Contusion of right knee, initial encounter: Secondary | ICD-10-CM

## 2016-08-08 DIAGNOSIS — S065XAA Traumatic subdural hemorrhage with loss of consciousness status unknown, initial encounter: Secondary | ICD-10-CM

## 2016-08-08 DIAGNOSIS — E785 Hyperlipidemia, unspecified: Secondary | ICD-10-CM | POA: Diagnosis not present

## 2016-08-08 DIAGNOSIS — I629 Nontraumatic intracranial hemorrhage, unspecified: Secondary | ICD-10-CM | POA: Insufficient documentation

## 2016-08-08 DIAGNOSIS — Z888 Allergy status to other drugs, medicaments and biological substances status: Secondary | ICD-10-CM | POA: Diagnosis not present

## 2016-08-08 DIAGNOSIS — S060X0A Concussion without loss of consciousness, initial encounter: Secondary | ICD-10-CM

## 2016-08-08 DIAGNOSIS — M170 Bilateral primary osteoarthritis of knee: Secondary | ICD-10-CM | POA: Diagnosis not present

## 2016-08-08 DIAGNOSIS — S0633AA Contusion and laceration of cerebrum, unspecified, with loss of consciousness status unknown, initial encounter: Secondary | ICD-10-CM

## 2016-08-08 DIAGNOSIS — N184 Chronic kidney disease, stage 4 (severe): Secondary | ICD-10-CM | POA: Diagnosis present

## 2016-08-08 DIAGNOSIS — Z90710 Acquired absence of both cervix and uterus: Secondary | ICD-10-CM | POA: Insufficient documentation

## 2016-08-08 DIAGNOSIS — R4182 Altered mental status, unspecified: Secondary | ICD-10-CM | POA: Diagnosis present

## 2016-08-08 DIAGNOSIS — Y92007 Garden or yard of unspecified non-institutional (private) residence as the place of occurrence of the external cause: Secondary | ICD-10-CM | POA: Insufficient documentation

## 2016-08-08 DIAGNOSIS — M25561 Pain in right knee: Secondary | ICD-10-CM | POA: Insufficient documentation

## 2016-08-08 LAB — CBC WITH DIFFERENTIAL/PLATELET
BASOS PCT: 0 %
Basophils Absolute: 0 10*3/uL (ref 0.0–0.1)
EOS PCT: 2 %
Eosinophils Absolute: 0.2 10*3/uL (ref 0.0–0.7)
HEMATOCRIT: 38.5 % (ref 36.0–46.0)
Hemoglobin: 12.1 g/dL (ref 12.0–15.0)
LYMPHS ABS: 2.6 10*3/uL (ref 0.7–4.0)
Lymphocytes Relative: 27 %
MCH: 28.6 pg (ref 26.0–34.0)
MCHC: 31.4 g/dL (ref 30.0–36.0)
MCV: 91 fL (ref 78.0–100.0)
MONO ABS: 0.7 10*3/uL (ref 0.1–1.0)
MONOS PCT: 7 %
NEUTROS ABS: 6.3 10*3/uL (ref 1.7–7.7)
Neutrophils Relative %: 64 %
PLATELETS: 289 10*3/uL (ref 150–400)
RBC: 4.23 MIL/uL (ref 3.87–5.11)
RDW: 13.7 % (ref 11.5–15.5)
WBC: 9.8 10*3/uL (ref 4.0–10.5)

## 2016-08-08 LAB — COMPREHENSIVE METABOLIC PANEL
ALT: 11 U/L — ABNORMAL LOW (ref 14–54)
AST: 24 U/L (ref 15–41)
Albumin: 3.5 g/dL (ref 3.5–5.0)
Alkaline Phosphatase: 55 U/L (ref 38–126)
Anion gap: 8 (ref 5–15)
BILIRUBIN TOTAL: 0.8 mg/dL (ref 0.3–1.2)
BUN: 25 mg/dL — AB (ref 6–20)
CHLORIDE: 101 mmol/L (ref 101–111)
CO2: 29 mmol/L (ref 22–32)
CREATININE: 2.02 mg/dL — AB (ref 0.44–1.00)
Calcium: 9.3 mg/dL (ref 8.9–10.3)
GFR, EST AFRICAN AMERICAN: 25 mL/min — AB (ref >60)
GFR, EST NON AFRICAN AMERICAN: 22 mL/min — AB (ref >60)
Glucose, Bld: 180 mg/dL — ABNORMAL HIGH (ref 65–99)
POTASSIUM: 3.9 mmol/L (ref 3.5–5.1)
Sodium: 138 mmol/L (ref 135–145)
TOTAL PROTEIN: 6.4 g/dL — AB (ref 6.5–8.1)

## 2016-08-08 LAB — PROTIME-INR
INR: 1.08
Prothrombin Time: 14 seconds (ref 11.4–15.2)

## 2016-08-08 LAB — GLUCOSE, CAPILLARY: Glucose-Capillary: 95 mg/dL (ref 65–99)

## 2016-08-08 MED ORDER — ACETAMINOPHEN 500 MG PO TABS: 1000.0000 mg | ORAL_TABLET | Freq: Four times a day (QID) | ORAL | Status: DC | PRN

## 2016-08-08 MED ORDER — INSULIN ASPART 100 UNIT/ML ~~LOC~~ SOLN: 0.0000 [IU] | Freq: Every day | SUBCUTANEOUS | Status: DC

## 2016-08-08 MED ORDER — FENOFIBRATE 54 MG PO TABS
54.0000 mg | ORAL_TABLET | Freq: Every day | ORAL | Status: DC
  Administered 2016-08-09: 54 mg via ORAL
  Filled 2016-08-08: qty 1

## 2016-08-08 MED ORDER — ESCITALOPRAM OXALATE 10 MG PO TABS
20.0000 mg | ORAL_TABLET | Freq: Every day | ORAL | Status: DC
  Administered 2016-08-09: 20 mg via ORAL
  Filled 2016-08-08: qty 2

## 2016-08-08 MED ORDER — LORAZEPAM 2 MG/ML IJ SOLN
1.0000 mg | Freq: Once | INTRAMUSCULAR | Status: AC | PRN
  Administered 2016-08-08: 1 mg via INTRAVENOUS
  Filled 2016-08-08: qty 1

## 2016-08-08 MED ORDER — ACETAMINOPHEN 325 MG PO TABS
650.0000 mg | ORAL_TABLET | Freq: Once | ORAL | Status: AC
  Administered 2016-08-08: 650 mg via ORAL
  Filled 2016-08-08: qty 2

## 2016-08-08 MED ORDER — LABETALOL HCL 5 MG/ML IV SOLN
10.0000 mg | Freq: Once | INTRAVENOUS | Status: AC
  Administered 2016-08-08: 10 mg via INTRAVENOUS
  Filled 2016-08-08: qty 4

## 2016-08-08 MED ORDER — SODIUM CHLORIDE 0.9% FLUSH
3.0000 mL | Freq: Two times a day (BID) | INTRAVENOUS | Status: DC
  Administered 2016-08-08: 3 mL via INTRAVENOUS

## 2016-08-08 MED ORDER — INSULIN ASPART 100 UNIT/ML ~~LOC~~ SOLN
0.0000 [IU] | Freq: Three times a day (TID) | SUBCUTANEOUS | Status: DC
  Administered 2016-08-09: 1 [IU] via SUBCUTANEOUS

## 2016-08-08 MED ORDER — ONDANSETRON HCL 4 MG PO TABS: 4.0000 mg | ORAL_TABLET | Freq: Four times a day (QID) | ORAL | Status: DC | PRN

## 2016-08-08 MED ORDER — HYDRALAZINE HCL 20 MG/ML IJ SOLN
5.0000 mg | INTRAMUSCULAR | Status: DC | PRN
  Administered 2016-08-09: 10 mg via INTRAVENOUS
  Filled 2016-08-08: qty 1

## 2016-08-08 MED ORDER — ALPRAZOLAM 0.25 MG PO TABS: 0.2500 mg | ORAL_TABLET | Freq: Two times a day (BID) | ORAL | Status: DC | PRN

## 2016-08-08 MED ORDER — FUROSEMIDE 20 MG PO TABS: 20.0000 mg | ORAL_TABLET | Freq: Every day | ORAL | Status: DC | PRN

## 2016-08-08 MED ORDER — TRAMADOL HCL 50 MG PO TABS: 100.0000 mg | ORAL_TABLET | Freq: Four times a day (QID) | ORAL | Status: DC | PRN

## 2016-08-08 MED ORDER — ACETAMINOPHEN 650 MG RE SUPP: 650.0000 mg | Freq: Four times a day (QID) | RECTAL | Status: DC | PRN

## 2016-08-08 MED ORDER — ONDANSETRON HCL 4 MG/2ML IJ SOLN: 4.0000 mg | Freq: Four times a day (QID) | INTRAMUSCULAR | Status: DC | PRN

## 2016-08-08 MED ORDER — SODIUM CHLORIDE 0.9 % IV BOLUS (SEPSIS)
1000.0000 mL | Freq: Once | INTRAVENOUS | Status: AC
  Administered 2016-08-08: 1000 mL via INTRAVENOUS

## 2016-08-08 MED ORDER — DIPHENHYDRAMINE HCL 25 MG PO CAPS: 25.0000 mg | ORAL_CAPSULE | Freq: Four times a day (QID) | ORAL | Status: DC | PRN

## 2016-08-08 MED ORDER — LABETALOL HCL 5 MG/ML IV SOLN
10.0000 mg | Freq: Once | INTRAVENOUS | Status: AC
  Administered 2016-08-08: 10 mg via INTRAVENOUS

## 2016-08-08 NOTE — Progress Notes (Signed)
Orthopedic Tech Progress Note Patient Details:  Jill [name] 1932/05/28 [id number]  Ortho Devices Type of Ortho Device: Knee Sleeve, Thumb spica splint Splint Material: Fiberglass Ortho Device/Splint Location: Applied Knee Sleeve to Right Knee, and applied Thumb spica splint to right hand. Family at bedside.  Ortho Device/Splint Interventions: Application   Kristopher Oppenheim 08/08/2016, 3:02 PM

## 2016-08-08 NOTE — ED Notes (Signed)
Attempted report 

## 2016-08-08 NOTE — ED Notes (Signed)
Pt returned from MRI °

## 2016-08-08 NOTE — H&P (Signed)
History and Physical    Jill [name] WER:[id number] DOB: September 29, 1931 DOA: 08/08/2016  PCP: Eliezer Lofts, MD Patient coming from: home  Chief Complaint: fall  HPI: Jill [name] is a 82 y.o. female with medical history significant of CJD, depression, diabetes, H LD, hypertension, previous syncopal episodes presenting 2 days after sustaining a fall. Patient reports walking to her backyard and tripping over [place] tree roots. Patient fell forward onto outstretched hand bilaterally and knees. Denies any loss of consciousness though states feeling like she was in a daze for some time after the initial episode. Since episode patient endorses dull achy headache but has not done any better or worse since onset. Also endorsing bilateral wrist and knee pain since onset. Worse with movement of these appendages. Since onset also endorses somewhat of a foggy headed feeling and intermittent dizziness and intermittent difficulty walking. Denies any vertigo, chest pain, palpitations, shortness of breath, nausea, vomiting, fever, dysuria, frequency, back pain. Per reports by family members who are with the patient she has been a little more confused since her episode and expressed episodes of blurry vision which quickly resolved.   ED Course: Objective findings outlined below. Right wrist placed in static splint by orthopedic tech.  Review of Systems: As per HPI otherwise 10 point review of systems negative.   Ambulatory Status: No restrictions  Past Medical History:  Diagnosis Date  . Allergy   . Arthritis    osteoarthritis   . Chronic kidney disease   . Complication of anesthesia    difficult waking   . Depression   . Diabetes mellitus   . Diverticula, colon   . Family history of anesthesia complication    Son is difficult intubation  . Hyperlipidemia   . Hypertension   . Vasovagal syncope 2006   Negative cardiac workup-myoview, echo    Past Surgical History:  Procedure Laterality Date  .  ABDOMINAL HYSTERECTOMY  1977   fibroid  . JOINT REPLACEMENT  2009   rt hip  . TOTAL HIP ARTHROPLASTY Left 05/12/2014   dr Mayer Camel  . TOTAL HIP ARTHROPLASTY Left 05/12/2014   Procedure: TOTAL HIP ARTHROPLASTY;  Surgeon: Kerin Salen, MD;  Location: [facility];  Service: Orthopedics;  Laterality: Left;    Social History   Social History  . Marital status: Widowed    Spouse name: Jill [name]  . Number of children: Jill [name]  . Years of education: Jill [name]   Occupational History  . Not on file.   Social History Main Topics  . Smoking status: Former Smoker    Packs/day: 1.00    Years: 45.00    Types: Cigarettes    Quit date: 06/16/1999  . Smokeless tobacco: Never Used     Comment: quit 15 years ago  . Alcohol use No  . Drug use: No  . Sexual activity: Not Currently   Other Topics Concern  . 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 Diet: healthy diet      Son [name]) [place] , has living will and DNR ( reviewed 2015)                Allergies  Allergen Reactions  . Lovastatin Other (See Comments)  REACTION: leg pain  . Statins Other (See Comments)    REACTION: leg cramps, weakness  . Sulfa Antibiotics Hives and Itching  . Codeine Rash  . Niacin Rash    Flushing also    Family History  Problem Relation Age of Onset  . COPD Brother   . Heart disease Brother   . Diabetes Brother   . Lymphoma Brother   . Alzheimer's disease Brother   . Pancreatic cancer Sister   . Alzheimer's disease Sister   . Heart disease Mother   . Breast cancer Sister   . Leukemia Other   . Kidney disease Neg Hx     Prior to Admission medications   Medication Sig Start Date End Date Taking? Authorizing Provider  ALPRAZolam (XANAX) 0.25 MG  tablet Take 1 tablet (0.25 mg total) by mouth 2 (two) times daily. Patient taking differently: Take 0.25 mg by mouth 2 (two) times daily as needed for anxiety.  05/06/16  Yes Amy Cletis Athens, MD  aspirin 325 MG tablet Take 325 mg by mouth daily.   Yes Historical Provider, MD  aspirin-acetaminophen-caffeine (EXCEDRIN MIGRAINE) [phone] MG tablet Take 1 tablet by mouth every 6 (six) hours as needed for headache.   Yes Historical Provider, MD  Cholecalciferol (VITAMIN D3) 50000 units CAPS 1 cap weekly x 12 weeks Patient taking differently: Take 50,000 Units by mouth once a week.  07/05/16  Yes Amy Cletis Athens, MD  conjugated estrogens (PREMARIN) vaginal cream Place 1 Applicatorful vaginally daily. Apply 0.5mg  (pea-sized amount)  just inside the vaginal introitus with a finger-tip every night for two weeks and then Monday, Wednesday and Friday nights. 05/17/16  Yes Shannon A McGowan, PA-C  diphenhydrAMINE (BENADRYL) 25 MG tablet Take 25 mg by mouth every 6 (six) hours as needed for itching.   Yes Historical Provider, MD  escitalopram (LEXAPRO) 20 MG tablet TAKE 1 TABLET BY MOUTH DAILY 02/21/16  Yes Amy E Bedsole, MD  fenofibrate 54 MG tablet TAKE TWO (2) TABLETS BY MOUTH DAILY 06/28/16  Yes Amy E Bedsole, MD  furosemide (LASIX) 20 MG tablet Take 20 mg by mouth daily as needed for fluid.    Yes Historical Provider, MD  estradiol (ESTRACE VAGINAL) 0.1 MG/GM vaginal cream Apply 0.5mg  (pea-sized amount)  just inside the vaginal introitus with a finger-tip every night for two weeks and then Monday, Wednesday and Friday nights. Patient not taking: Reported on 08/08/2016 05/17/16   Nori Riis, PA-C    Physical Exam: Vitals:   08/08/16 1545 08/08/16 1600 08/08/16 1615 08/08/16 1645  BP: 141/56 140/63 132/60 (!) 145/54  Pulse: 78 76 78 79  Resp: 19 19 20 23   Temp:      TempSrc:      SpO2: 92% 91% 94% (!) 89%  Weight:      Height:         General:  Appears calm and comfortable Eyes:  PERRL, EOMI,  normal lids, iris ENT:  grossly normal hearing, lips & tongue, mmm Neck:  no LAD, masses or thyromegaly Cardiovascular:  RRR, no m/r/g. No LE edema.  Respiratory:  CTA bilaterally, no w/r/r. Normal respiratory effort. Abdomen:  soft, ntnd, NABS Skin:  no rash or induration seen on limited exam Musculoskeletal: Bilateral knees and wrists painful to manipulation and palpation. Right wrist in static splint with Ace wrap. Right knee with soft brace. Range of motion remains fairly intact. Psychiatric:  grossly normal mood and affect, speech fluent and appropriate, AOx3. Has difficulty with some of  the finer details of her fall/injury. Neurologic:  CN 2-12 grossly intact, moves all extremities in coordinated fashion, sensation intact  Labs on Admission: I have personally reviewed following labs and imaging studies  CBC:  Recent Labs Lab 08/08/16 1205  WBC 9.8  NEUTROABS 6.3  HGB 12.1  HCT 38.5  MCV 91.0  PLT 626   Basic Metabolic Panel:  Recent Labs Lab 08/08/16 1205  NA 138  K 3.9  CL 101  CO2 29  GLUCOSE 180*  BUN 25*  CREATININE 2.02*  CALCIUM 9.3   GFR: Estimated Creatinine Clearance: 21.1 mL/min (by C-G formula based on SCr of 2.02 mg/dL (H)). Liver Function Tests:  Recent Labs Lab 08/08/16 1205  AST 24  ALT 11*  ALKPHOS 55  BILITOT 0.8  PROT 6.4*  ALBUMIN 3.5   No results for input(s): LIPASE, AMYLASE in the last 168 hours. No results for input(s): AMMONIA in the last 168 hours. Coagulation Profile:  Recent Labs Lab 08/08/16 1205  INR 1.08   Cardiac Enzymes: No results for input(s): CKTOTAL, CKMB, CKMBINDEX, TROPONINI in the last 168 hours. BNP (last 3 results)  Recent Labs  12/08/15 1622  PROBNP 504.0*   HbA1C: No results for input(s): HGBA1C in the last 72 hours. CBG: No results for input(s): GLUCAP in the last 168 hours. Lipid Profile: No results for input(s): CHOL, HDL, LDLCALC, TRIG, CHOLHDL, LDLDIRECT in the last 72 hours. Thyroid  Function Tests: No results for input(s): TSH, T4TOTAL, FREET4, T3FREE, THYROIDAB in the last 72 hours. Anemia Panel: No results for input(s): VITAMINB12, FOLATE, FERRITIN, TIBC, IRON, RETICCTPCT in the last 72 hours. Urine analysis:    Component Value Date/Time   COLORURINE YELLOW (A) 02/16/2015 1807   APPEARANCEUR Cloudy (A) 01/19/2016 1338   LABSPEC 1.010 02/16/2015 1807   PHURINE 5.0 02/16/2015 1807   GLUCOSEU Negative 01/19/2016 1338   HGBUR NEGATIVE 02/16/2015 1807   BILIRUBINUR Negative 01/19/2016 1338   KETONESUR NEGATIVE 02/16/2015 1807   PROTEINUR Negative 01/19/2016 1338   PROTEINUR NEGATIVE 02/16/2015 1807   UROBILINOGEN 0.2 12/08/2015 1556   UROBILINOGEN 0.2 02/03/2015 1427   NITRITE Negative 01/19/2016 1338   NITRITE NEGATIVE 02/16/2015 1807   LEUKOCYTESUR 3+ (A) 01/19/2016 1338    Creatinine Clearance: Estimated Creatinine Clearance: 21.1 mL/min (by C-G formula based on SCr of 2.02 mg/dL (H)).  Sepsis Labs: @LABRCNTIP (procalcitonin:4,lacticidven:4) )No results found for this or any previous visit (from the past 240 hour(s)).   Radiological Exams on Admission: Dg Chest 2 View  Result Date: 08/08/2016 CLINICAL DATA:  Fall this past Saturday.  Blurred vision.  Diabetes. EXAM: CHEST  2 VIEW COMPARISON:  02/03/2015 radiographs FINDINGS: Atherosclerotic calcification of the aortic arch. Heart size within normal limits given the AP projection. Trace fluid in the left major fissure inferiorly, but otherwise the small pleural effusions shown on the exam from June 2016 have resolved. No pneumothorax.  Mild thoracic spondylosis. IMPRESSION: 1. Trace fluid or atelectasis along the bottom portion of the left major fissure. However, there is no blunting of the costophrenic angles, and this represents an improvement from 2016. 2. Atherosclerotic aortic arch. Electronically Signed   By: Van Clines M.D.   On: 08/08/2016 12:36   Dg Wrist Complete Right  Result Date:  08/08/2016 CLINICAL DATA:  Fall. EXAM: RIGHT WRIST - COMPLETE 3+ VIEW COMPARISON:  No recent prior. FINDINGS: Widening of the scapholunate space noted consistent scapholunate dissociation. Diffuse degenerative change. No evidence of fracture dislocation. IMPRESSION: 1. Widening of the scapholunate joint consistent  with scapholunate dissociation. 2. Diffuse degenerative change. No acute bony abnormality identified. Electronically Signed   By: Marcello Moores  Register   On: 08/08/2016 10:57   Dg Knee 2 Views Left  Result Date: 08/08/2016 CLINICAL DATA:  Fall. EXAM: LEFT KNEE - 1-2 VIEW COMPARISON:  No recent prior. FINDINGS: No acute bony or joint abnormality identified. No evidence of fracture dislocation. Mild tricompartment degenerative change. Small knee joint effusion cannot be excluded. IMPRESSION: Small knee joint effusion cannot be excluded. Mild tricompartment degenerative change. No acute bony abnormality . Electronically Signed   By: Marcello Moores  Register   On: 08/08/2016 10:56   Dg Knee 2 Views Right  Result Date: 08/08/2016 CLINICAL DATA:  Fall . EXAM: RIGHT KNEE - 1-2 VIEW COMPARISON:  No recent prior . FINDINGS: Diffuse osteopenia. No acute bony abnormality P Mild tricompartment degenerative change. Small loose bodies cannot be excluded. Tiny soft tissue calcification noted adjacent to the distal right femur. This most likely benign. IMPRESSION: Diffuse osteopenia mild degenerative change. No acute bony abnormality identified. Electronically Signed   By: Marcello Moores  Register   On: 08/08/2016 10:58   Ct Head Wo Contrast  Result Date: 08/08/2016 CLINICAL DATA:  Headache.  Fall this morning.  Initial encounter. EXAM: CT HEAD WITHOUT CONTRAST TECHNIQUE: Contiguous axial images were obtained from the base of the skull through the vertex without intravenous contrast. COMPARISON:  Brain MRI and CT 05/16/2014 FINDINGS: Brain: There is a 1.7 cm focus of acute parenchymal hemorrhage in the left occipital lobe with  a fluid-fluid level and mild surrounding edema. There is mild mass effect on the adjacent left occipital horn. There is asymmetric thin hyperdensity along the left tentorium measuring 1-1.5 mm in thickness and suggestive of a trace amount of subdural hemorrhage. There is moderate cerebral atrophy, and patchy to confluent bilateral cerebral white matter hypodensities are nonspecific but compatible with moderate chronic small vessel ischemic disease. There is no midline shift. Vascular: Calcified atherosclerosis at the skullbase. No hyperdense vessel. Skull: There is a 7 mm lucent lesion in the right parietal skull which extends to the outer table and has enlarged from the 2015 CT (previously 3 mm). Two smaller lucent lesions in the right frontal and right parietal skull are unchanged. Sinuses/Orbits: Trace left sphenoid sinus mucosal thickening. Clear mastoid air cells. Prior bilateral cataract extraction. Other: None. IMPRESSION: 1. 1.7 cm left occipital lobe parenchymal hemorrhage with mild surrounding edema. 2. Trace subdural hemorrhage along the left tentorium. 3. Moderate chronic small vessel ischemic disease and cerebral atrophy. 4. 7 mm right parietal skull lesion, indeterminate though enlarged since 2015. Critical Value/emergent results were called by telephone at the time of interpretation on 08/08/2016 at 11:38 am to Dr. Ellender Hose, who verbally acknowledged these results. Electronically Signed   By: Logan Bores M.D.   On: 08/08/2016 11:40   Ct Knee Right Wo Contrast  Result Date: 08/08/2016 CLINICAL DATA:  Status post fall.  Right knee pain. EXAM: CT OF THE RIGHT KNEE WITHOUT CONTRAST TECHNIQUE: Multidetector CT imaging of the RIGHT knee was performed according to the standard protocol. Multiplanar CT image reconstructions were also generated. COMPARISON:  None. FINDINGS: Bones/Joint/Cartilage Generalized osteopenia. Mild medial femorotibial compartment joint space narrowing. Lateral femorotibial  compartment joint space is maintained. Moderate lateral patellofemoral compartment joint space narrowing. No acute fracture or dislocation. No lytic or sclerotic osseous lesion. No periosteal reaction or bone destruction. No significant joint effusion. No Baker cyst. Normal alignment. Ligaments Suboptimally assessed by CT. Muscles and Tendons Muscles are normal. No  muscle atrophy. Intact quadriceps tendon and patellar tendon. Soft tissues No soft tissue mass. No fluid collection or hematoma. Mild soft tissue edema of the subcutaneous fat involving the anterior proximal right lower leg. IMPRESSION: 1.  No acute osseous injury of the right knee. 2. Mild medial femorotibial compartment and moderate lateral patellofemoral compartment osteoarthritis. Electronically Signed   By: Kathreen Devoid   On: 08/08/2016 13:24    EKG: pending.   Assessment/Plan Active Problems:   CHRONIC KIDNEY DISEASE STAGE III (MODERATE)   Intraparenchymal hematoma of brain (HCC)   Diabetes mellitus with complication (HCC)   Wrist injury   Chronic diastolic congestive heart failure (HCC)   Concussion with no loss of consciousness   INtracranial Hemorrhage: 1.7cm occipital parenchymal and trace L tentorium hemorrhages. Neurosurgery , Dr Ditty aware and opting for conservative mgt and 24 hr obs admission - Neuro checks  - re-image if worsening mental function - hold home ASA  AMS: foggy headed feeling, HA, intermittent dizziness blurry vision, and intermittent confusion. Pt w/o recollection of striking head. Likely w/ concussion. ICH as above.  - limit mental stimulation as part of informal concussion protocol (Nursing care instructions) until cleared by PCP outpt  Fall: sounds mechanical but cannot r/o stroke given onging sx as described above which started at time of fall. - MRI brain - PT/OT  AoCKD: Cr 2.02. Baseline 1.7. Likely from poor oral intake since time of fall. - IVF - BMP in am  R wrist injury: Widening of  the scapholunate joint consistent with scapholunate dissociation noted on plain film. Thumb spica placed in ED. - - Ice, elevation, pain mgt - f/u Ortho outpt.  Diastolic congestive heart failure: EF of 55% with grade 1 diastolic dysfunction. Compensated. - continue lasix  DM: A1c 7.8. No hom DM medications - SSI  Knee pain: bilat. Chronic arthritis made worse from fall. No fractures. - ICE, elevation, pain mgt - PT/OT  Anxiety: - continue Xanax  Depression: - continue lexapro  Skull lesion: noted on CT. 4. 7 mm right parietal region. No primary source for bony cancer - f/u outpt - MRI as above.    DVT prophylaxis: SCD  Code Status: full  Family Communication: son and grandaughter  Disposition Plan: pending workup  Consults called: none  Admission status: obs - tele    MERRELL, DAVID J MD [facility]  If 7PM-7AM, please contact night-coverage [email or web address] Password [email or web address]  08/08/2016, 5:07 PM

## 2016-08-08 NOTE — Telephone Encounter (Signed)
Patient Name: HANAH MOULTRY  DOB: September 25, 1931    Initial Comment Caller states 82 yr old mother fell Saturday evening; is a little fuzzy in the head; having some blurred vision; hit her head when she fell;    Nurse Assessment  Nurse: Leilani Merl, RN, Heather Date/Time (Eastern Time): 08/08/2016 8:29:50 AM  Confirm and document reason for call. If symptomatic, describe symptoms. ---Caller states 15 yr old mother fell Saturday evening; is a little fuzzy in the head; having some blurred vision; hit her head when she fell;  Does the patient have any new or worsening symptoms? ---Yes  Will a triage be completed? ---Yes  Related visit to physician within the last 2 weeks? ---No  Does the PT have any chronic conditions? (i.e. diabetes, asthma, etc.) ---Yes  List chronic conditions. ---See MR  Is this a behavioral health or substance abuse call? ---No     Guidelines    Guideline Title Affirmed Question Affirmed Notes  Head Injury [1] Loss of vision or double vision AND [2] present now    Final Disposition User   Go to ED Now [place], RN, [facility] - ED   Disagree/Comply: Comply

## 2016-08-08 NOTE — ED Notes (Signed)
Pt to MRI

## 2016-08-08 NOTE — ED Notes (Signed)
Pt on bedpan.

## 2016-08-08 NOTE — ED Provider Notes (Addendum)
[facility] DEPT Provider Note   CSN: [id number] Arrival date & time: 08/08/16  9983     History   Chief Complaint Chief Complaint  Patient presents with  . Fall    HPI Jill [name] is a 82 y.o. female.  HPI 82 year old female with past medical history as below, on aspirin daily, who presents with multiple complaints after a fall. The patient was walking in the dark Saturday night when she tripped over a tree branch. She fell directly onto her head and bilateral hands. Since then, she has had a mild, aching, dull headache throughout the day and had increased confusion as well as intermittent blurred vision throughout the day yesterday. She also notes an aching, throbbing right wrist as well as right knee pain and has had difficulty bearing weight on it. Her family visited her throughout the day and he was concerned that she was not her mental baseline and brought her to the ED here for further evaluation. No fevers or chills.  Past Medical History:  Diagnosis Date  . Allergy   . Arthritis    osteoarthritis   . Chronic kidney disease   . Complication of anesthesia    difficult waking   . Depression   . Diabetes mellitus   . Diverticula, colon   . Family history of anesthesia complication    Son is difficult intubation  . Hyperlipidemia   . Hypertension   . Vasovagal syncope 2006   Negative cardiac workup-myoview, echo    Patient Active Problem List   Diagnosis Date Noted  . Intraparenchymal hematoma of brain ([place]) 08/08/2016  . Diabetes mellitus with complication ([place]) 38/25/0539  . Wrist injury 08/08/2016  . Chronic diastolic congestive heart failure ([place]) 08/08/2016  . Concussion with no loss of consciousness 08/08/2016  . BPPV (benign paroxysmal positional vertigo) 06/24/2016  . Memory loss 06/24/2016  . Accidental fall 06/24/2016  . Chronic neck pain 03/18/2016  . Autonomic neuropathy due to diabetes ([place]) 03/18/2016  . Anemia in chronic kidney disease  12/15/2015  . Dyspnea on exertion 12/08/2015  . Peripheral edema 12/08/2015  . Incomplete bladder emptying 10/15/2015  . Abscess, renal/perirenal 10/15/2015  . Recurrent UTI 10/15/2015  . Nocturia 08/21/2015  . Weakness 06/02/2015  . Lumbar back pain with radiculopathy affecting left lower extremity 05/05/2015  . Renal abscess 01/28/2015  . AKI (acute kidney injury) ([place]) 01/27/2015  . Chronic fatigue 01/22/2015  . Chronic idiopathic urticaria 01/17/2015  . S/P hip replacement 05/16/2014  . DNR (do not resuscitate) 02/20/2014  . ALLERGIC RHINITIS 12/17/2008  . SYNCOPE 10/27/2008  . Vitamin D deficiency 07/01/2008  . CHRONIC KIDNEY DISEASE STAGE III (MODERATE) 02/11/2008  . DIVERTICULOSIS OF COLON 02/01/2008  . COLONIC POLYPS, ADENOMATOUS 10/19/2006  . Diabetes mellitus due to underlying condition with diabetic autonomic neuropathy ([place]) 10/19/2006  . Hyperlipidemia 10/19/2006  . OBESITY, NOS 10/19/2006  . Major depression, recurrent ([place]) 10/19/2006  . HYPERTENSION, BENIGN SYSTEMIC 10/19/2006  . Recurrent urticaria 10/19/2006    Past Surgical History:  Procedure Laterality Date  . ABDOMINAL HYSTERECTOMY  1977   fibroid  . JOINT REPLACEMENT  2009   rt hip  . TOTAL HIP ARTHROPLASTY Left 05/12/2014   dr Mayer Camel  . TOTAL HIP ARTHROPLASTY Left 05/12/2014   Procedure: TOTAL HIP ARTHROPLASTY;  Surgeon: Kerin Salen, MD;  Location: [facility];  Service: Orthopedics;  Laterality: Left;    OB History    No data available       Home Medications  Prior to Admission medications   Medication Sig Start Date End Date Taking? Authorizing Provider  ALPRAZolam (XANAX) 0.25 MG tablet Take 1 tablet (0.25 mg total) by mouth 2 (two) times daily. Patient taking differently: Take 0.25 mg by mouth 2 (two) times daily as needed for anxiety.  05/06/16  Yes Amy Cletis [place], MD  aspirin 325 MG tablet Take 325 mg by mouth daily.   Yes Historical Provider, MD  aspirin-acetaminophen-caffeine (EXCEDRIN  MIGRAINE) [phone] MG tablet Take 1 tablet by mouth every 6 (six) hours as needed for headache.   Yes Historical Provider, MD  Cholecalciferol (VITAMIN D3) 50000 units CAPS 1 cap weekly x 12 weeks Patient taking differently: Take 50,000 Units by mouth once a week.  07/05/16  Yes Amy Cletis [place], MD  conjugated estrogens (PREMARIN) vaginal cream Place 1 Applicatorful vaginally daily. Apply 0.5mg  (pea-sized amount)  just inside the vaginal introitus with a finger-tip every night for two weeks and then Monday, Wednesday and Friday nights. 05/17/16  Yes Shannon A McGowan, PA-C  diphenhydrAMINE (BENADRYL) 25 MG tablet Take 25 mg by mouth every 6 (six) hours as needed for itching.   Yes Historical Provider, MD  escitalopram (LEXAPRO) 20 MG tablet TAKE 1 TABLET BY MOUTH DAILY 02/21/16  Yes Amy E Bedsole, MD  fenofibrate 54 MG tablet TAKE TWO (2) TABLETS BY MOUTH DAILY 06/28/16  Yes Amy E Bedsole, MD  furosemide (LASIX) 20 MG tablet Take 20 mg by mouth daily as needed for fluid.    Yes Historical Provider, MD  estradiol (ESTRACE VAGINAL) 0.1 MG/GM vaginal cream Apply 0.5mg  (pea-sized amount)  just inside the vaginal introitus with a finger-tip every night for two weeks and then Monday, Wednesday and Friday nights. Patient not taking: Reported on 08/08/2016 05/17/16   Nori Riis, PA-C    Family History Family History  Problem Relation Age of Onset  . COPD Brother   . Heart disease Brother   . Diabetes Brother   . Lymphoma Brother   . Alzheimer's disease Brother   . Pancreatic cancer Sister   . Alzheimer's disease Sister   . Heart disease Mother   . Breast cancer Sister   . Leukemia Other   . Kidney disease Neg Hx     Social History Social History  Substance Use Topics  . Smoking status: Former Smoker    Packs/day: 1.00    Years: 45.00    Types: Cigarettes    Quit date: 06/16/1999  . Smokeless tobacco: Never Used     Comment: quit 15 years ago  . Alcohol use No     Allergies    Lovastatin; Statins; Sulfa antibiotics; Codeine; and Niacin   Review of Systems Review of Systems  Constitutional: Positive for fatigue. Negative for chills and fever.  HENT: Negative for congestion and rhinorrhea.   Eyes: Positive for visual disturbance.  Respiratory: Negative for cough, shortness of breath and wheezing.   Cardiovascular: Negative for chest pain and leg swelling.  Gastrointestinal: Negative for abdominal pain, diarrhea, nausea and vomiting.  Genitourinary: Negative for dysuria and flank pain.  Musculoskeletal: Negative for neck pain and neck stiffness.  Skin: Negative for rash and wound.  Allergic/Immunologic: Negative for immunocompromised state.  Neurological: Positive for headaches. Negative for syncope and weakness.  All other systems reviewed and are negative.    Physical Exam Updated Vital Signs BP (!) 145/54   Pulse 79   Temp 99.7 F (37.6 C)   Resp 23   Ht 5\' 1"  (1.549 m)  Wt 185 lb (83.9 kg)   SpO2 (!) 89%   BMI 34.96 kg/m   Physical Exam  Constitutional: She is oriented to person, place, and time. She appears well-developed and well-nourished. No distress.  HENT:  Head: Normocephalic and atraumatic.  Eyes: Conjunctivae are normal.  Neck: Neck supple.  Cardiovascular: Normal rate, regular rhythm and normal heart sounds.  Exam reveals no friction rub.   No murmur heard. Pulmonary/Chest: Effort normal and breath sounds normal. No respiratory distress. She has no wheezes. She has no rales.  Abdominal: She exhibits no distension.  Musculoskeletal: She exhibits no edema.  Neurological: She is alert and oriented to person, place, and time. She exhibits normal muscle tone.  Skin: Skin is warm. Capillary refill takes less than 2 seconds.  Psychiatric: She has a normal mood and affect.  Nursing note and vitals reviewed.   Neurological Exam:  Mental Status: Alert and oriented to person, place, and time. Attention and concentration normal. Speech  clear. Recent memory is intact. Cranial Nerves: Visual fields mildly diminished in right visual fields bilaterally. EOMI and PERRLA. No nystagmus noted. Facial sensation intact at forehead, maxillary cheek, and chin/mandible bilaterally. No weakness of masticatory muscles. No facial asymmetry or weakness. Hearing grossly normal to finer rub. Uvula is midline, and palate elevates symmetrically. Normal SCM and trapezius strength. Tongue midline without fasciculations Motor: Muscle strength 5/5 in proximal and distal UE and LE bilaterally. No pronator drift. Muscle tone normal. Reflexes: 2+ and symmetrical in all four extremities.  Sensation: Intact to light touch in upper and lower extremities distally bilaterally.  Gait: Deferred Coordination: Moderate tremor but no overt past-pointing on FTN bilaterally.  ED Treatments / Results  Labs (all labs ordered are listed, but only abnormal results are displayed) Labs Reviewed  COMPREHENSIVE METABOLIC PANEL - Abnormal; Notable for the following:       Result Value   Glucose, Bld 180 (*)    BUN 25 (*)    Creatinine, Ser 2.02 (*)    Total Protein 6.4 (*)    ALT 11 (*)    GFR calc non Af Amer 22 (*)    GFR calc Af Amer 25 (*)    All other components within normal limits  CBC WITH DIFFERENTIAL/PLATELET  PROTIME-INR  URINALYSIS, ROUTINE W REFLEX MICROSCOPIC  CBC  BASIC METABOLIC PANEL    EKG  EKG Interpretation None       Radiology Dg Chest 2 View  Result Date: 08/08/2016 CLINICAL DATA:  Fall this past Saturday.  Blurred vision.  Diabetes. EXAM: CHEST  2 VIEW COMPARISON:  02/03/2015 radiographs FINDINGS: Atherosclerotic calcification of the aortic arch. Heart size within normal limits given the AP projection. Trace fluid in the left major fissure inferiorly, but otherwise the small pleural effusions shown on the exam from June 2016 have resolved. No pneumothorax.  Mild thoracic spondylosis. IMPRESSION: 1. Trace fluid or atelectasis along  the bottom portion of the left major fissure. However, there is no blunting of the costophrenic angles, and this represents an improvement from 2016. 2. Atherosclerotic aortic arch. Electronically Signed   By: Van Clines M.D.   On: 08/08/2016 12:36   Dg Wrist Complete Right  Result Date: 08/08/2016 CLINICAL DATA:  Fall. EXAM: RIGHT WRIST - COMPLETE 3+ VIEW COMPARISON:  No recent prior. FINDINGS: Widening of the scapholunate space noted consistent scapholunate dissociation. Diffuse degenerative change. No evidence of fracture dislocation. IMPRESSION: 1. 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 2. Diffuse degenerative change. No acute  bony abnormality identified. Electronically Signed   By: Marcello Moores  Register   On: 08/08/2016 10:57   Dg Knee 2 Views Left  Result Date: 08/08/2016 CLINICAL DATA:  Fall. EXAM: LEFT KNEE - 1-2 VIEW COMPARISON:  No recent prior. FINDINGS: No acute bony or joint abnormality identified. No evidence of fracture dislocation. Mild tricompartment degenerative change. Small knee joint effusion cannot be excluded. IMPRESSION: Small knee joint effusion cannot be excluded. Mild tricompartment degenerative change. No acute bony abnormality . Electronically Signed   By: Marcello Moores  Register   On: 08/08/2016 10:56   Dg Knee 2 Views Right  Result Date: 08/08/2016 CLINICAL DATA:  Fall . EXAM: RIGHT KNEE - 1-2 VIEW COMPARISON:  No recent prior . FINDINGS: Diffuse osteopenia. No acute bony abnormality P Mild tricompartment degenerative change. Small loose bodies cannot be excluded. Tiny soft tissue calcification noted adjacent to the distal right femur. This most likely benign. IMPRESSION: Diffuse osteopenia mild degenerative change. No acute bony abnormality identified. Electronically Signed   By: Marcello Moores  Register   On: 08/08/2016 10:58   Ct Head Wo Contrast  Result Date: 08/08/2016 CLINICAL DATA:  Headache.  Fall this morning.  Initial encounter.  EXAM: CT HEAD WITHOUT CONTRAST TECHNIQUE: Contiguous axial images were obtained from the base of the skull through the vertex without intravenous contrast. COMPARISON:  Brain MRI and CT 05/16/2014 FINDINGS: Brain: There is a 1.7 cm focus of acute parenchymal hemorrhage in the left occipital lobe with a fluid-fluid level and mild surrounding edema. There is mild mass effect on the adjacent left occipital horn. There is asymmetric thin hyperdensity along the left tentorium measuring 1-1.5 mm in thickness and suggestive of a trace amount of subdural hemorrhage. There is moderate cerebral atrophy, and patchy to confluent bilateral cerebral white matter hypodensities are nonspecific but compatible with moderate chronic small vessel ischemic disease. There is no midline shift. Vascular: Calcified atherosclerosis at the skullbase. No hyperdense vessel. Skull: There is a 7 mm lucent lesion in the right parietal skull which extends to the outer table and has enlarged from the 2015 CT (previously 3 mm). Two smaller lucent lesions in the right frontal and right parietal skull are unchanged. Sinuses/Orbits: Trace left sphenoid sinus mucosal thickening. Clear mastoid air cells. Prior bilateral cataract extraction. Other: None. IMPRESSION: 1. 1.7 cm left occipital lobe parenchymal hemorrhage with mild surrounding edema. 2. Trace subdural hemorrhage along the left tentorium. 3. Moderate chronic small vessel ischemic disease and cerebral atrophy. 4. 7 mm right parietal skull lesion, indeterminate though enlarged since 2015. Critical Value/emergent results were called by telephone at the time of interpretation on 08/08/2016 at 11:38 am to Dr. Ellender Hose, who verbally acknowledged these results. Electronically Signed   By: Logan Bores M.D.   On: 08/08/2016 11:40   Ct Knee Right Wo Contrast  Result Date: 08/08/2016 CLINICAL DATA:  Status post fall.  Right knee pain. EXAM: CT OF THE RIGHT KNEE WITHOUT CONTRAST TECHNIQUE:  Multidetector CT imaging of the RIGHT knee was performed according to the standard protocol. Multiplanar CT image reconstructions were also generated. COMPARISON:  None. FINDINGS: Bones/Joint/Cartilage Generalized osteopenia. Mild medial femorotibial compartment joint space narrowing. Lateral femorotibial compartment joint space is maintained. Moderate lateral patellofemoral compartment joint space narrowing. No acute fracture or dislocation. No lytic or sclerotic osseous lesion. No periosteal reaction or bone destruction. No significant joint effusion. No Baker cyst. Normal alignment. Ligaments Suboptimally assessed by CT. Muscles and Tendons Muscles are normal. No muscle atrophy. Intact quadriceps tendon and patellar tendon. Soft  tissues No soft tissue mass. No fluid collection or hematoma. Mild soft tissue edema of the subcutaneous fat involving the anterior proximal right lower leg. IMPRESSION: 1.  No acute osseous injury of the right knee. 2. Mild medial femorotibial compartment and moderate lateral patellofemoral compartment osteoarthritis. Electronically Signed   By: Kathreen Devoid   On: 08/08/2016 13:24    Procedures Procedures (including critical care time)  Medications Ordered in ED Medications  LORazepam (ATIVAN) injection 1 mg (not administered)  diphenhydrAMINE (BENADRYL) capsule 25 mg (not administered)  furosemide (LASIX) tablet 20 mg (not administered)  fenofibrate tablet 54 mg (not administered)  ALPRAZolam (XANAX) tablet 0.25 mg (not administered)  escitalopram (LEXAPRO) tablet 20 mg (not administered)  hydrALAZINE (APRESOLINE) injection 5-10 mg (not administered)  sodium chloride flush (NS) 0.9 % injection 3 mL (not administered)  acetaminophen (TYLENOL) tablet 1,000 mg (not administered)    Or  acetaminophen (TYLENOL) suppository 650 mg (not administered)  traMADol (ULTRAM) tablet 100 mg (not administered)  ondansetron (ZOFRAN) tablet 4 mg (not administered)    Or  ondansetron  (ZOFRAN) injection 4 mg (not administered)  sodium chloride 0.9 % bolus 1,000 mL (not administered)  insulin aspart (novoLOG) injection 0-9 Units (not administered)  insulin aspart (novoLOG) injection 0-5 Units (not administered)  labetalol (NORMODYNE,TRANDATE) injection 10 mg (10 mg Intravenous Given 08/08/16 1352)  acetaminophen (TYLENOL) tablet 650 mg (650 mg Oral Given 08/08/16 1436)  labetalol (NORMODYNE,TRANDATE) injection 10 mg (10 mg Intravenous Given 08/08/16 1508)     Initial Impression / Assessment and Plan / ED Course  I have reviewed the triage vital signs and the nursing notes.  Pertinent labs & imaging results that were available during my care of the patient were reviewed by me and considered in my medical decision making (see chart for details).  Clinical Course      76 -year-old female with PMHx as above here with HA, right wrist pain, and knee pain s/p mechanical fall 24 hours ago. On arrival, pt mildly hypertensive but VS otherwise stable. Exam as above. Pt does have mild ataxia/tremor and gait instability. Regarding her fall, plain films and CT of knee are neg - suspect strain/sprain and will treat supportively. Her right wrist film does show possible SLD and will place in thumb spica with outpt hand follow-up. Regarding her headache and unsteadiness, CT Head does show IPH as well as SDH. D/w Dr. Cyndy Freeze who recommends obs overnight, d/c if stable. Pt in agreement. May need short term placement as she does live alone. Will admit to hospitalist. She uses an ASA which we will hold, but o/w no anticoagulants.  Final Clinical Impressions(s) / ED Diagnoses   Final diagnoses:  Intracranial bleed ([place])  Intraparenchymal hematoma of brain, left, with loss of consciousness of 30 minutes or less, initial encounter ([place])  Subdural hematoma ([facility])  Scapholunate dissociation of right wrist  Sprain of right knee, unspecified ligament, initial encounter  Contusion of right knee,  initial encounter     Duffy Bruce, MD 08/08/16 1708    Duffy Bruce, MD 08/08/16 2043

## 2016-08-08 NOTE — ED Notes (Signed)
Ortho paged. 

## 2016-08-08 NOTE — ED Triage Notes (Signed)
Pt reports she fell on Saturday and has bilateral knee pain, ankle pain and a headache. Pt denies blood thinner use but has had some blurry vision.

## 2016-08-09 DIAGNOSIS — S06360S Traumatic hemorrhage of cerebrum, unspecified, without loss of consciousness, sequela: Secondary | ICD-10-CM

## 2016-08-09 DIAGNOSIS — S6990XA Unspecified injury of unspecified wrist, hand and finger(s), initial encounter: Secondary | ICD-10-CM

## 2016-08-09 DIAGNOSIS — N183 Chronic kidney disease, stage 3 (moderate): Secondary | ICD-10-CM | POA: Diagnosis not present

## 2016-08-09 DIAGNOSIS — E118 Type 2 diabetes mellitus with unspecified complications: Secondary | ICD-10-CM

## 2016-08-09 LAB — BASIC METABOLIC PANEL
ANION GAP: 9 (ref 5–15)
BUN: 21 mg/dL — ABNORMAL HIGH (ref 6–20)
CALCIUM: 8.6 mg/dL — AB (ref 8.9–10.3)
CHLORIDE: 105 mmol/L (ref 101–111)
CO2: 24 mmol/L (ref 22–32)
Creatinine, Ser: 1.77 mg/dL — ABNORMAL HIGH (ref 0.44–1.00)
GFR calc non Af Amer: 26 mL/min — ABNORMAL LOW (ref >60)
GFR, EST AFRICAN AMERICAN: 30 mL/min — AB (ref >60)
GLUCOSE: 104 mg/dL — AB (ref 65–99)
POTASSIUM: 3.8 mmol/L (ref 3.5–5.1)
Sodium: 138 mmol/L (ref 135–145)

## 2016-08-09 LAB — GLUCOSE, CAPILLARY
GLUCOSE-CAPILLARY: 125 mg/dL — AB (ref 65–99)
Glucose-Capillary: 110 mg/dL — ABNORMAL HIGH (ref 65–99)

## 2016-08-09 LAB — CBC
HEMATOCRIT: 34.5 % — AB (ref 36.0–46.0)
HEMOGLOBIN: 10.9 g/dL — AB (ref 12.0–15.0)
MCH: 28.6 pg (ref 26.0–34.0)
MCHC: 31.6 g/dL (ref 30.0–36.0)
MCV: 90.6 fL (ref 78.0–100.0)
Platelets: 257 10*3/uL (ref 150–400)
RBC: 3.81 MIL/uL — AB (ref 3.87–5.11)
RDW: 13.6 % (ref 11.5–15.5)
WBC: 8.4 10*3/uL (ref 4.0–10.5)

## 2016-08-09 MED ORDER — ALPRAZOLAM 0.25 MG PO TABS: 0.2500 mg | ORAL_TABLET | Freq: Two times a day (BID) | ORAL | Status: DC | PRN

## 2016-08-09 MED ORDER — AMLODIPINE BESYLATE 10 MG PO TABS
10.0000 mg | ORAL_TABLET | Freq: Every day | ORAL | Status: DC
  Administered 2016-08-09: 10 mg via ORAL
  Filled 2016-08-09: qty 1

## 2016-08-09 MED ORDER — AMLODIPINE BESYLATE 5 MG PO TABS: 5.0000 mg | ORAL_TABLET | Freq: Every day | ORAL | 0 refills | Status: DC

## 2016-08-09 NOTE — Evaluation (Signed)
Occupational Therapy Evaluation Patient Details Name: Jill [name] MRN: [id number] DOB: 1932/06/30 Today's Date: 08/09/2016    History of Present Illness 84 yos/p fall with confusion intracranial hemorrhage 1.7 cm occipitial parenchymal and trace L tentorium hemorrhages and R scapholunate joint dislocation with thumb spica splint applied. PMH:   Past Medical History:  Diagnosis Date  . Allergy   . Arthritis    osteoarthritis   . Chronic kidney disease   . Complication of anesthesia    difficult waking   . Depression   . Diabetes mellitus   . Diverticula, colon   . Family history of anesthesia complication    Son is difficult intubation  . Hyperlipidemia   . Hypertension   . Vasovagal syncope 2006   Negative cardiac workup-myoview, echo      Clinical Impression   PT admitted with s/p fall with concussion. Pt currently with functional limitiations due to the deficits listed below (see OT problem list). PTA was living alone and will need higher level (A) than PRN visits from family.  Pt will benefit from skilled OT to increase their independence and safety with adls and balance to allow discharge SNF.     Follow Up Recommendations  SNF    Equipment Recommendations  Other (comment) (has DME)    Recommendations for Other Services       Precautions / Restrictions Precautions Precautions: Fall Precaution Comments: visual deficits on the R Required Braces or Orthoses: Other Brace/Splint Other Brace/Splint: R thumb spica/ pending arrival R knee brace ( ace wrap applied at this time to for comfort)      Mobility Bed Mobility Overal bed mobility: Needs Assistance Bed Mobility: Supine to Sit     Supine to sit: Min assist     General bed mobility comments: Pt able to power up into long sitting and incr time to progress toward EOB.   Transfers Overall transfer level: Needs assistance Equipment used: Rolling walker (2 wheeled) Transfers: Sit to/from Stand Sit  to Stand: Mod assist         General transfer comment: cues for hand placement and rocking momentum used for safety    Balance Overall balance assessment: Needs assistance Sitting-balance support: Bilateral upper extremity supported;Feet supported Sitting balance-Leahy Scale: Fair     Standing balance support: Single extremity supported;During functional activity Standing balance-Leahy Scale: Poor                              ADL Overall ADL's : Needs assistance/impaired Eating/Feeding: Minimal assistance;Sitting Eating/Feeding Details (indicate cue type and reason): provided foam, mug and education for self feeding Grooming: Wash/dry face;Oral care;Minimal assistance;Standing Grooming Details (indicate cue type and reason): needed (A) due to R UE deficits.  Upper Body Bathing: Moderate assistance   Lower Body Bathing: Moderate assistance           Toilet Transfer: Minimal assistance           Functional mobility during ADLs: Minimal assistance General ADL Comments: Pt requires (A) for positioning safety at sink levle     Vision Vision Assessment?: Vision impaired- to be further tested in functional context Additional Comments: Pt during functional assessment extra head turns shifts and trouble locating on R visual field.    Perception     Praxis      Pertinent Vitals/Pain Pain Assessment: No/denies pain     Hand Dominance Right   Extremity/Trunk Assessment Upper Extremity Assessment Upper Extremity  Assessment: RUE deficits/detail RUE Deficits / Details: thumb spica splint   Lower Extremity Assessment Lower Extremity Assessment: RLE deficits/detail RLE Deficits / Details: knee pain   Cervical / Trunk Assessment Cervical / Trunk Assessment: Kyphotic   Communication Communication Communication: HOH   Cognition Arousal/Alertness: Awake/alert Behavior During Therapy: WFL for tasks assessed/performed Overall Cognitive Status:  Impaired/Different from baseline Area of Impairment: Orientation;Attention;Memory;Following commands;Safety/judgement;Awareness;Problem solving Orientation Level: Disoriented to;Place;Time Current Attention Level: Sustained Memory: Decreased recall of precautions;Decreased short-term memory Following Commands: Follows one step commands with increased time Safety/Judgement: Decreased awareness of safety Awareness: Intellectual Problem Solving: Slow processing;Decreased initiation;Difficulty sequencing General Comments: pt with granddaughter in room but unable to recall her name. pt calls her instead "the love of my life" pt able to recall granddaughters dogs name without cues. Pt asking where the dog is without awareness to hospitalization. pt provided questions to help sequence problem solving location. Pt able to states hospital [facility]       Exercises       Shoulder Instructions      Home Living Family/patient expects to be discharged to:: Private residence Living Arrangements: Alone Available Help at Discharge: Family;Friend(s);Available PRN/intermittently (son can help he is retired) Type of Home: House Home Access: [place]: One level     Bathroom Shower/Tub: Teacher, early years/pre: Section: Environmental consultant - 2 wheels;Bedside commode;Grab bars - toilet;Grab bars - tub/shower;Shower seat;Adaptive equipment;Hand held shower head;Other (comment) Adaptive Equipment: Reacher        Prior Functioning/Environment Level of Independence: Independent with assistive device(s)                 OT Problem List: Decreased strength;Decreased range of motion;Decreased activity tolerance;Impaired balance (sitting and/or standing);Impaired vision/perception;Decreased coordination;Decreased cognition;Decreased safety awareness;Decreased knowledge of use of DME or AE;Decreased knowledge of  precautions;Cardiopulmonary status limiting activity;Pain;Impaired UE functional use   OT Treatment/Interventions: Self-care/ADL training;Therapeutic exercise;DME and/or AE instruction;Energy conservation;Therapeutic activities;Cognitive remediation/compensation;Visual/perceptual remediation/compensation;Patient/family education;Balance training    OT Goals(Current goals can be found in the care plan section) Acute Rehab OT Goals Patient Stated Goal: to see granddaughter dog OT Goal Formulation: With patient/family Time For Goal Achievement: 08/23/16 Potential to Achieve Goals: Good  OT Frequency: Min 2X/week   Barriers to D/C: Decreased caregiver support          Co-evaluation              End of Session Equipment Utilized During Treatment: Gait belt;Rolling walker Nurse Communication: Mobility status;Precautions  Activity Tolerance: Patient tolerated treatment well Patient left: in chair;with call bell/phone within reach;with chair alarm set;with family/visitor present   Time: 5885-0277 OT Time Calculation (min): 25 min Charges:  OT General Charges $OT Visit: 1 Procedure OT Evaluation $OT Eval Moderate Complexity: 1 Procedure OT Treatments $Self Care/Home Management : 8-22 mins G-Codes: OT G-codes **NOT FOR INPATIENT CLASS** Functional Assessment Tool Used: clinical judgement Functional Limitation: Self care Self Care Current Status (A1287): At least 60 percent but less than 80 percent impaired, limited or restricted Self Care Goal Status (O6767): At least 40 percent but less than 60 percent impaired, limited or restricted  Peri Maris 08/09/2016, 9:30 AM   Jeri Modena   OTR/L Pager: [phone] Office: [phone] .

## 2016-08-09 NOTE — Progress Notes (Signed)
Orthopedic Tech Progress Note Patient Details:  Jill [name] 1932/07/19 [id number]  Ortho Devices Type of Ortho Device: Knee Sleeve Splint Material: Fiberglass Ortho Device/Splint Location: rle Ortho Device/Splint Interventions: Application   Jill [name] 08/09/2016, 9:11 AM

## 2016-08-09 NOTE — Care Management Note (Signed)
Case Management Note  Patient Details  Name: Jill [name] MRN: [id number] Date of Birth: 04-24-1932  Subjective/Objective:                    Action/Plan: Pt discharging home with orders for [facility] services. CM met with the patient and her family and provided them a list of [facility] agencies in [place]. They selected [facility]. Mary with [facility] notified and accepted the referral. Pt family states they are able to provide 24 hour supervision and that Pt has all needed equipment already at home. Family providing transportation home.   Expected Discharge Date:  08/10/16               Expected Discharge Plan:  [place]  In-House Referral:     Discharge planning Services  CM Consult  Post Acute Care Choice:  Home Health Choice offered to:  Patient, Adult Children  DME Arranged:    DME Agency:     HH Arranged:  PT, OT, Nurse's Aide, Social Work [employer] Agency:  [employer] (now [facility])  Status of Service:  Completed, signed off  If discussed at [employer], dates discussed:    Additional Comments:  Pollie Friar, RN 08/09/2016, 2:01 PM

## 2016-08-09 NOTE — Progress Notes (Signed)
F/u bp check with pcp scheduled for dec. 29 at 4:00pm

## 2016-08-09 NOTE — Evaluation (Signed)
Physical Therapy Evaluation Patient Details Name: Jill [name] MRN: [id number] DOB: 26-Apr-1932 Today's Date: 08/09/2016   History of Present Illness  82 yo s/p fall with confusion intracranial hemorrhage 1.7 cm occipitial parenchymal and trace L tentorium hemorrhages and R scapholunate joint dislocation with thumb spica splint applied. PMH: CJD, depression, diabetes, H LD, hypertension, previous syncopal episodes   Clinical Impression  Patient evaluated by Physical Therapy with no further acute PT needs identified. All education has been completed for discharge home with granddaughter and daughter-in-law. The patient and family have no further questions. All are agreeable to continued HHPT for further education. See below for any follow-up Physical Therapy or equipment needs. PT is signing off. Thank you for this referral.     Follow Up Recommendations Home health PT;Supervision/Assistance - 24 hour    Equipment Recommendations  None recommended by PT    Recommendations for Other Services       Precautions / Restrictions Precautions Precautions: Fall Precaution Comments: visual deficits on the R Required Braces or Orthoses: Other Brace/Splint Other Brace/Splint: R thumb spica/ pending arrival R knee brace ( ace wrap applied at this time to for comfort)      Mobility  Bed Mobility Overal bed mobility: Needs Assistance Bed Mobility: Supine to Sit     Supine to sit: Min guard     General bed mobility comments: HOB flat, no rail; able to use Rt elbow to power up  Transfers Overall transfer level: Needs assistance Equipment used: 4-wheeled walker Transfers: Sit to/from Stand Sit to Stand: Min guard         General transfer comment: x 4; cues for hand placement, scoot to edge with feet underneath for safety  Ambulation/Gait Ambulation/Gait assistance: Min guard Ambulation Distance (Feet): 30 Feet (seated rest; 100) Assistive device: 4-wheeled walker;Rolling walker (2  wheeled) Gait Pattern/deviations: Step-through pattern;Decreased stride length;Trunk flexed Gait velocity: appropriate for use of rollator   General Gait Details: no visible limp due to knee pain; able to maneuver rollator through most narrow spaces (grandaughter and dtr in law assisted as she needed)  Science writer    Modified Rankin (Stroke Patients Only)       Balance Overall balance assessment: Needs assistance Sitting-balance support: Feet supported;No upper extremity supported Sitting balance-Leahy Scale: Fair     Standing balance support: During functional activity;No upper extremity supported Standing balance-Leahy Scale: Fair                               Pertinent Vitals/Pain Pain Assessment: Faces Faces Pain Scale: Hurts little more Pain Location: thumb more than knee per pt Pain Descriptors / Indicators: Guarding Pain Intervention(s): Limited activity within patient's tolerance;Monitored during session;Repositioned    Home Living Family/patient expects to be discharged to:: Private residence Living Arrangements: Alone Available Help at Discharge: Family;Friend(s);Available 24 hours/day (dtr-in-law and grandaughter to provide as long as needed) Type of Home: House Home Access: Ramped entrance     Home Layout: One level Home Equipment: Bedside commode;Grab bars - toilet;Grab bars - tub/shower;Shower seat;Adaptive equipment;Hand held shower head;Other (comment);Walker - 4 wheels;Wheelchair - manual      Prior Function Level of Independence: Independent with assistive device(s)               Hand Dominance   Dominant Hand: Right    Extremity/Trunk Assessment   Upper Extremity Assessment Upper Extremity  Assessment: Defer to OT evaluation    Lower Extremity Assessment Lower Extremity Assessment: RLE deficits/detail RLE Deficits / Details: knee pain    Cervical / Trunk Assessment Cervical / Trunk  Assessment: Kyphotic  Communication   Communication: HOH  Cognition Arousal/Alertness: Awake/alert Behavior During Therapy: WFL for tasks assessed/performed Overall Cognitive Status: Impaired/Different from baseline Area of Impairment: Memory;Safety/judgement;Awareness;Problem solving     Memory: Decreased recall of precautions;Decreased short-term memory   Safety/Judgement: Decreased awareness of safety Awareness: Intellectual Problem Solving: Slow processing;Decreased initiation;Difficulty sequencing;Requires verbal cues;Requires tactile cues General Comments: despite instructions repeatedly for safe use of rollator, she continued to need cues for safe use    General Comments General comments (skin integrity, edema, etc.): Family present throughout. Insist pt will go home and they will provide 24/7 care for as long as she needs. Agreeable to [facility] therapies to incr independence    Exercises     Assessment/Plan    PT Assessment All further PT needs can be met in the next venue of care  PT Problem List Decreased strength;Decreased balance;Decreased mobility;Decreased cognition;Decreased knowledge of use of DME;Decreased safety awareness;Decreased knowledge of precautions;Obesity;Pain          PT Treatment Interventions      PT Goals (Current goals can be found in the Care Plan section)  Acute Rehab PT Goals Patient Stated Goal: to see granddaughter dog PT Goal Formulation: All assessment and education complete, DC therapy    Frequency     Barriers to discharge        Co-evaluation               End of Session Equipment Utilized During Treatment: Gait belt Activity Tolerance: Patient tolerated treatment well Patient left: in chair;with call bell/phone within reach;with family/visitor present Nurse Communication: Mobility status;Other (comment) (OK to d/c with family)    Functional Assessment Tool Used: clinical judgement Functional Limitation: Mobility: Walking  and moving around Mobility: Walking and Moving Around Current Status [phone]): At least 1 percent but less than 20 percent impaired, limited or restricted Mobility: Walking and Moving Around Goal Status [phone]): At least 1 percent but less than 20 percent impaired, limited or restricted Mobility: Walking and Moving Around Discharge Status [phone]): At least 1 percent but less than 20 percent impaired, limited or restricted    Time: 1201-1226 PT Time Calculation (min) (ACUTE ONLY): 25 min   Charges:   PT Evaluation $PT Eval Low Complexity: 1 Procedure PT Treatments $Gait Training: 8-22 mins   PT G Codes:   PT G-Codes **NOT FOR INPATIENT CLASS** Functional Assessment Tool Used: clinical judgement Functional Limitation: Mobility: Walking and moving around Mobility: Walking and Moving Around Current Status (V4008): At least 1 percent but less than 20 percent impaired, limited or restricted Mobility: Walking and Moving Around Goal Status [phone]): At least 1 percent but less than 20 percent impaired, limited or restricted Mobility: Walking and Moving Around Discharge Status [phone]): At least 1 percent but less than 20 percent impaired, limited or restricted    Rexanne Mano 08/09/2016, 1:25 PM Pager [phone]

## 2016-08-09 NOTE — Telephone Encounter (Signed)
Agreed -

## 2016-08-09 NOTE — Discharge Instructions (Signed)
Fall Prevention in the Home Falls can cause injuries and can affect people from all age groups. There are many simple things that you can do to make your home safe and to help prevent falls. What can I do on the outside of my home?  Regularly repair the edges of walkways and driveways and fix any cracks.  Remove high doorway thresholds.  Trim any shrubbery on the main path into your home.  Use bright outdoor lighting.  Clear walkways of debris and clutter, including tools and rocks.  Regularly check that handrails are securely fastened and in good repair. Both sides of any steps should have handrails.  Install guardrails along the edges of any raised decks or porches.  Have leaves, snow, and ice cleared regularly.  Use sand or salt on walkways during winter months.  In the garage, clean up any spills right away, including grease or oil spills. What can I do in the bathroom?  Use night lights.  Install grab bars by the toilet and in the tub and shower. Do not use towel bars as grab bars.  Use non-skid mats or decals on the floor of the tub or shower.  If you need to sit down while you are in the shower, use a plastic, non-slip stool.  Keep the floor dry. Immediately clean up any water that spills on the floor.  Remove soap buildup in the tub or shower on a regular basis.  Attach bath mats securely with double-sided non-slip rug tape.  Remove throw rugs and other tripping hazards from the floor. What can I do in the bedroom?  Use night lights.  Make sure that a bedside light is easy to reach.  Do not use oversized bedding that drapes onto the floor.  Have a firm chair that has side arms to use for getting dressed.  Remove throw rugs and other tripping hazards from the floor. What can I do in the kitchen?  Clean up any spills right away.  Avoid walking on wet floors.  Place frequently used items in easy-to-reach places.  If you need to reach for something above  you, use a sturdy step stool that has a grab bar.  Keep electrical cables out of the way.  Do not use floor polish or wax that makes floors slippery. If you have to use wax, make sure that it is non-skid floor wax.  Remove throw rugs and other tripping hazards from the floor. What can I do in the stairways?  Do not leave any items on the stairs.  Make sure that there are handrails on both sides of the stairs. Fix handrails that are broken or loose. Make sure that handrails are as long as the stairways.  Check any carpeting to make sure that it is firmly attached to the stairs. Fix any carpet that is loose or worn.  Avoid having throw rugs at the top or bottom of stairways, or secure the rugs with carpet tape to prevent them from moving.  Make sure that you have a light switch at the top of the stairs and the bottom of the stairs. If you do not have them, have them installed. What are some other fall prevention tips?  Wear closed-toe shoes that fit well and support your feet. Wear shoes that have rubber soles or low heels.  When you use a stepladder, make sure that it is completely opened and that the sides are firmly locked. Have someone hold the ladder while you are using  it. Do not climb a closed stepladder.  Add color or contrast paint or tape to grab bars and handrails in your home. Place contrasting color strips on the first and last steps.  Use mobility aids as needed, such as canes, walkers, scooters, and crutches.  Turn on lights if it is dark. Replace any light bulbs that burn out.  Set up furniture so that there are clear paths. Keep the furniture in the same spot.  Fix any uneven floor surfaces.  Choose a carpet design that does not hide the edge of steps of a stairway.  Be aware of any and all pets.  Review your medicines with your healthcare provider. Some medicines can cause dizziness or changes in blood pressure, which increase your risk of falling. Talk with  your health care provider about other ways that you can decrease your risk of falls. This may include working with a physical therapist or trainer to improve your strength, balance, and endurance. This information is not intended to replace advice given to you by your health care provider. Make sure you discuss any questions you have with your health care provider. Document Released: 07/29/2002 Document Revised: 01/05/2016 Document Reviewed: 09/12/2014 [employer] Interactive Patient Education  2017 [employer]  Amlodipine tablets What is this medicine? AMLODIPINE (am LOE di peen) is a calcium-channel blocker. It affects the amount of calcium found in your heart and muscle cells. This relaxes your blood vessels, which can reduce the amount of work the heart has to do. This medicine is used to lower high blood pressure. It is also used to prevent chest pain. This medicine may be used for other purposes; ask your health care provider or pharmacist if you have questions. COMMON BRAND NAME(S): Norvasc What should I tell my health care provider before I take this medicine? They need to know if you have any of these conditions: -heart problems like heart failure or aortic stenosis -liver disease -an unusual or allergic reaction to amlodipine, other medicines, foods, dyes, or preservatives -pregnant or trying to get pregnant -breast-feeding How should I use this medicine? Take this medicine by mouth with a glass of water. Follow the directions on the prescription label. Take your medicine at regular intervals. Do not take more medicine than directed. Talk to your pediatrician regarding the use of this medicine in children. Special care may be needed. This medicine has been used in children as young as 6. Persons over 46 years old may have a stronger reaction to this medicine and need smaller doses. Overdosage: If you think you have taken too much of this medicine contact a poison control center or  emergency room at once. NOTE: This medicine is only for you. Do not share this medicine with others. What if I miss a dose? If you miss a dose, take it as soon as you can. If it is almost time for your next dose, take only that dose. Do not take double or extra doses. What may interact with this medicine? -herbal or dietary supplements -local or general anesthetics -medicines for high blood pressure -medicines for prostate problems -rifampin This list may not describe all possible interactions. Give your health care provider a list of all the medicines, herbs, non-prescription drugs, or dietary supplements you use. Also tell them if you smoke, drink alcohol, or use illegal drugs. Some items may interact with your medicine. What should I watch for while using this medicine? Visit your doctor or health care professional for regular check ups. Check  your blood pressure and pulse rate regularly. Ask your health care professional what your blood pressure and pulse rate should be, and when you should contact him or her. This medicine may make you feel confused, dizzy or lightheaded. Do not drive, use machinery, or do anything that needs mental alertness until you know how this medicine affects you. To reduce the risk of dizzy or fainting spells, do not sit or stand up quickly, especially if you are an older patient. Avoid alcoholic drinks; they can make you more dizzy. Do not suddenly stop taking amlodipine. Ask your doctor or health care professional how you can gradually reduce the dose. What side effects may I notice from receiving this medicine? Side effects that you should report to your doctor or health care professional as soon as possible: -allergic reactions like skin rash, itching or hives, swelling of the face, lips, or tongue -breathing problems -changes in vision or hearing -chest pain -fast, irregular heartbeat -swelling of legs or ankles Side effects that usually do not require  medical attention (report to your doctor or health care professional if they continue or are bothersome): -dry mouth -facial flushing -nausea, vomiting -stomach gas, pain -tired, weak -trouble sleeping This list may not describe all possible side effects. Call your doctor for medical advice about side effects. You may report side effects to FDA at 1-800-FDA-1088. Where should I keep my medicine? Keep out of the reach of children. Store at room temperature between 59 and 86 degrees F (15 and 30 degrees C). Protect from light. Keep container tightly closed. Throw away any unused medicine after the expiration date. NOTE: This sheet is a summary. It may not cover all possible information. If you have questions about this medicine, talk to your doctor, pharmacist, or health care provider.  2017 [employer]vier/[employer] Standard (2012-07-06 11:40:58)

## 2016-08-09 NOTE — Discharge Summary (Signed)
Physician Discharge Summary  [name] BLT:[id number] DOB: 08/16/1932 DOA: 08/08/2016  PCP: Eliezer Lofts, MD  Admit date: 08/08/2016 Discharge date: 08/09/2016   Recommendations for Outpatient Follow-Up:   1. Monitor blood pressure 2. ASA erroneously continued on d/c AVS-- LM For family to d/c (home phone) 3. Outpatient ortho referral   Discharge Diagnosis:   Active Problems:   CHRONIC KIDNEY DISEASE STAGE III (MODERATE)   Intraparenchymal hematoma of brain (HCC)   Diabetes mellitus with complication (HCC)   Wrist injury   Chronic diastolic congestive heart failure ([place])   Concussion with no loss of consciousness   Discharge disposition:  Home.with home health and 24 hour supervision  Discharge Condition: Improved.  Diet recommendation: Low sodium, heart healthy.    Wound care: None.   History of Present Illness:   Jill [name] is a 82 y.o. female with medical history significant of CJD, depression, diabetes, H LD, hypertension, previous syncopal episodes presenting 2 days after sustaining a fall. Patient reports walking to her backyard and tripping over [place] tree roots. Patient fell forward onto outstretched hand bilaterally and knees. Denies any loss of consciousness though states feeling like she was in a daze for some time after the initial episode. Since episode patient endorses dull achy headache but has not done any better or worse since onset. Also endorsing bilateral wrist and knee pain since onset. Worse with movement of these appendages. Since onset also endorses somewhat of a foggy headed feeling and intermittent dizziness and intermittent difficulty walking. Denies any vertigo, chest pain, palpitations, shortness of breath, nausea, vomiting, fever, dysuria, frequency, back pain. Per reports by family members who are with the patient she has been a little more confused since her episode and expressed episodes of blurry vision which quickly  resolved.   Hospital Course by Problem:   INtracranial Hemorrhage: 1.7cm occipital parenchymal and trace L tentorium hemorrhages. Neurosurgery , Dr Ditty aware and opting for conservative mgt and 24 hr obs admission - d/c home ASA  AMS: -Likely w/ concussion. ICH as above.  - resolved-- back to baseline per family  Fall:  - PT/OT- home health  AoCKD: . Baseline 1.7. Likely from poor oral intake since time of fall. 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 Thumb spica placed in ED. - - f/u Ortho outpatient  Diastolic congestive heart failure: EF of 55% with grade 1 diastolic dysfunction. Compensated. - continue lasix  DM: A1c 7.8. No home DM medications  Knee pain: bilat. Chronic arthritis made worse from fall. No fractures.  Anxiety: - continue Xanax as per home dose  Depression: - continue lexapro  Skull lesion: noted on CT. 4. 7 mm right parietal region. No primary source for bony cancer - f/u outpt     Medical Consultants:    NS   Discharge Exam:   Vitals:   08/09/16 1006 08/09/16 1353  BP: (!) 198/83 (!) 144/66  Pulse: 83 85  Resp: 20 18  Temp: 98.2 F (36.8 C) 98.3 F (36.8 C)   Vitals:   08/09/16 0707 08/09/16 0841 08/09/16 1006 08/09/16 1353  BP: (!) 186/78 (!) 178/89 (!) 198/83 (!) 144/66  Pulse: 86  83 85  Resp: 16  20 18   Temp: 98.4 F (36.9 C)  98.2 F (36.8 C) 98.3 F (36.8 C)  TempSrc: Oral  Oral Oral  SpO2: 94%  96% 95%  Weight:  Height:        Gen:  NAD    The results of significant diagnostics from this hospitalization (including imaging, microbiology, ancillary and laboratory) are listed below for reference.     Procedures and Diagnostic Studies:   Dg Chest 2 View  Result Date: 08/08/2016 CLINICAL DATA:  Fall this past Saturday.  Blurred vision.  Diabetes. EXAM: CHEST  2 VIEW COMPARISON:  02/03/2015 radiographs  FINDINGS: Atherosclerotic calcification of the aortic arch. Heart size within normal limits given the AP projection. Trace fluid in the left major fissure inferiorly, but otherwise the small pleural effusions shown on the exam from June 2016 have resolved. No pneumothorax.  Mild thoracic spondylosis. IMPRESSION: 1. Trace fluid or atelectasis along the bottom portion of the left major fissure. However, there is no blunting of the costophrenic angles, and this represents an improvement from 2016. 2. Atherosclerotic aortic arch. Electronically Signed   By: Van Clines M.D.   On: 08/08/2016 12:36   Dg Wrist Complete Right  Result Date: 08/08/2016 CLINICAL DATA:  Fall. EXAM: RIGHT WRIST - COMPLETE 3+ VIEW COMPARISON:  No recent prior. FINDINGS: Widening of the scapholunate space noted consistent scapholunate dissociation. Diffuse degenerative change. No evidence of fracture dislocation. IMPRESSION: 1. 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 2. Diffuse degenerative change. No acute bony abnormality identified. Electronically Signed   By: Marcello Moores  Register   On: 08/08/2016 10:57   Dg Knee 2 Views Left  Result Date: 08/08/2016 CLINICAL DATA:  Fall. EXAM: LEFT KNEE - 1-2 VIEW COMPARISON:  No recent prior. FINDINGS: No acute bony or joint abnormality identified. No evidence of fracture dislocation. Mild tricompartment degenerative change. Small knee joint effusion cannot be excluded. IMPRESSION: Small knee joint effusion cannot be excluded. Mild tricompartment degenerative change. No acute bony abnormality . Electronically Signed   By: Marcello Moores  Register   On: 08/08/2016 10:56   Dg Knee 2 Views Right  Result Date: 08/08/2016 CLINICAL DATA:  Fall . EXAM: RIGHT KNEE - 1-2 VIEW COMPARISON:  No recent prior . FINDINGS: Diffuse osteopenia. No acute bony abnormality P Mild tricompartment degenerative change. Small loose bodies cannot be excluded. Tiny soft tissue  calcification noted adjacent to the distal right femur. This most likely benign. IMPRESSION: Diffuse osteopenia mild degenerative change. No acute bony abnormality identified. Electronically Signed   By: Marcello Moores  Register   On: 08/08/2016 10:58   Ct Head Wo Contrast  Result Date: 08/08/2016 CLINICAL DATA:  Headache.  Fall this morning.  Initial encounter. EXAM: CT HEAD WITHOUT CONTRAST TECHNIQUE: Contiguous axial images were obtained from the base of the skull through the vertex without intravenous contrast. COMPARISON:  Brain MRI and CT 05/16/2014 FINDINGS: Brain: There is a 1.7 cm focus of acute parenchymal hemorrhage in the left occipital lobe with a fluid-fluid level and mild surrounding edema. There is mild mass effect on the adjacent left occipital horn. There is asymmetric thin hyperdensity along the left tentorium measuring 1-1.5 mm in thickness and suggestive of a trace amount of subdural hemorrhage. There is moderate cerebral atrophy, and patchy to confluent bilateral cerebral white matter hypodensities are nonspecific but compatible with moderate chronic small vessel ischemic disease. There is no midline shift. Vascular: Calcified atherosclerosis at the skullbase. No hyperdense vessel. Skull: There is a 7 mm lucent lesion in the right parietal skull which extends to the outer table and has enlarged from the 2015 CT (previously 3 mm). Two smaller lucent lesions in the right frontal and right parietal skull  are unchanged. Sinuses/Orbits: Trace left sphenoid sinus mucosal thickening. Clear mastoid air cells. Prior bilateral cataract extraction. Other: None. IMPRESSION: 1. 1.7 cm left occipital lobe parenchymal hemorrhage with mild surrounding edema. 2. Trace subdural hemorrhage along the left tentorium. 3. Moderate chronic small vessel ischemic disease and cerebral atrophy. 4. 7 mm right parietal skull lesion, indeterminate though enlarged since 2015. Critical Value/emergent results were called by  telephone at the time of interpretation on 08/08/2016 at 11:38 am to Dr. Ellender Hose, who verbally acknowledged these results. Electronically Signed   By: Logan Bores M.D.   On: 08/08/2016 11:40   Ct Knee Right Wo Contrast  Result Date: 08/08/2016 CLINICAL DATA:  Status post fall.  Right knee pain. EXAM: CT OF THE RIGHT KNEE WITHOUT CONTRAST TECHNIQUE: Multidetector CT imaging of the RIGHT knee was performed according to the standard protocol. Multiplanar CT image reconstructions were also generated. COMPARISON:  None. FINDINGS: Bones/Joint/Cartilage Generalized osteopenia. Mild medial femorotibial compartment joint space narrowing. Lateral femorotibial compartment joint space is maintained. Moderate lateral patellofemoral compartment joint space narrowing. No acute fracture or dislocation. No lytic or sclerotic osseous lesion. No periosteal reaction or bone destruction. No significant joint effusion. No Baker cyst. Normal alignment. Ligaments Suboptimally assessed by CT. Muscles and Tendons Muscles are normal. No muscle atrophy. Intact quadriceps tendon and patellar tendon. Soft tissues No soft tissue mass. No fluid collection or hematoma. Mild soft tissue edema of the subcutaneous fat involving the anterior proximal right lower leg. IMPRESSION: 1.  No acute osseous injury of the right knee. 2. Mild medial femorotibial compartment and moderate lateral patellofemoral compartment osteoarthritis. Electronically Signed   By: Kathreen Devoid   On: 08/08/2016 13:24   Mr Brain Wo Contrast  Result Date: 08/08/2016 CLINICAL DATA:  82 year old female with CJD, depression, diabetes, hypertension and high lipids presenting with syncopal episode 2 days after falling. Subsequent encounter. EXAM: MRI HEAD WITHOUT CONTRAST TECHNIQUE: Multiplanar, multiecho pulse sequences of the brain and surrounding structures were obtained without intravenous contrast. COMPARISON:  08/08/2016 head CT.  05/16/2014 brain MR. FINDINGS: Exam is  motion degraded. Brain: Left occipital 1.5 x 1.4 x 1.6 cm hematoma with surrounding vasogenic edema. Right posterior lenticular nucleus 1 x 0.6 x 0.7 cm subacute to chronic hematoma. No acute thrombotic infarct. Marked chronic microvascular changes. Moderate global atrophy without hydrocephalus. Right calvarial 8 mm lesion of indeterminate etiology as noted on recent CT. Vascular: Major intracranial vascular structures are patent. Skull and upper cervical spine: Mild transverse ligament hypertrophy. Sinuses/Orbits: Post lens replacement without acute orbital abnormality. Minimal ethmoid sinus air cell mucosal thickening. Other: Negative. IMPRESSION: Exam is motion degraded. Left occipital 1.5 x 1.4 x 1.6 cm hematoma with surrounding vasogenic edema. Etiology indeterminate. This may be related to amyloid angiopathy. No adjacent major dural sinus thrombosis. Right posterior lenticular nucleus 1 x 0.6 x 0.7 cm subacute to chronic hematoma. This may reflect result of prior hemorrhage or remote hemorrhagic infarct and is new since 2015 MR. No acute thrombotic infarct. Marked chronic microvascular changes. Moderate global atrophy without hydrocephalus. INtracranial Hemorrhage: 1.7cm occipital parenchymal and trace L tentorium hemorrhages. Neurosurgery , Dr Ditty aware and opting for conservative mgt and 24 hr obs admission - Neuro checks  - re-image if worsening mental function - hold home ASA  AMS: foggy headed feeling, HA, intermittent dizziness blurry vision, and intermittent confusion. Pt w/o recollection of striking head. Likely w/ concussion. ICH as above.  - limit mental stimulation as part of informal concussion protocol (Nursing care instructions) until  cleared by PCP outpt  Fall: sounds mechanical but cannot r/o stroke given onging sx as described above which started at time of fall. - MRI brain - PT/OT  AoCKD: Cr 2.02. Baseline 1.7. Likely from poor oral intake since time of fall. 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 Thumb spica placed in ED. - - Ice, elevation, pain mgt - f/u Ortho outpt.  Diastolic congestive heart failure: EF of 55% with grade 1 diastolic dysfunction. Compensated. - continue lasix  DM: A1c 7.8. No hom DM medications - SSI  Knee pain: bilat. Chronic arthritis made worse from fall. No fractures. - ICE, elevation, pain mgt - PT/OT  Anxiety: - continue Xanax  Depression: - continue lexapro  Skull lesion: noted on CT. 4. 7 mm right parietal region. No primary source for bony cancer - f/u outpt -see MRI below     Labs:   Basic Metabolic Panel:  Recent Labs Lab 08/08/16 1205 08/09/16 0604  NA 138 138  K 3.9 3.8  CL 101 105  CO2 29 24  GLUCOSE 180* 104*  BUN 25* 21*  CREATININE 2.02* 1.77*  CALCIUM 9.3 8.6*   GFR Estimated Creatinine Clearance: 24.1 mL/min (by C-G formula based on SCr of 1.77 mg/dL (H)). Liver Function Tests:  Recent Labs Lab 08/08/16 1205  AST 24  ALT 11*  ALKPHOS 55  BILITOT 0.8  PROT 6.4*  ALBUMIN 3.5   No results for input(s): LIPASE, AMYLASE in the last 168 hours. No results for input(s): AMMONIA in the last 168 hours. Coagulation profile  Recent Labs Lab 08/08/16 1205  INR 1.08    CBC:  Recent Labs Lab 08/08/16 1205 08/09/16 0604  WBC 9.8 8.4  NEUTROABS 6.3  --   HGB 12.1 10.9*  HCT 38.5 34.5*  MCV 91.0 90.6  PLT 289 257   Cardiac Enzymes: No results for input(s): CKTOTAL, CKMB, CKMBINDEX, TROPONINI in the last 168 hours. BNP: Invalid input(s): POCBNP CBG:  Recent Labs Lab 08/08/16 2058 08/09/16 0658 08/09/16 1127  GLUCAP 95 110* 125*   D-Dimer No results for input(s): DDIMER in the last 72 hours. Hgb A1c No results for input(s): HGBA1C in the last 72 hours. Lipid Profile No results for input(s): CHOL, HDL, LDLCALC, TRIG, CHOLHDL, LDLDIRECT in the last 72  hours. Thyroid function studies No results for input(s): TSH, T4TOTAL, T3FREE, THYROIDAB in the last 72 hours.  Invalid input(s): FREET3 Anemia work up No results for input(s): VITAMINB12, FOLATE, FERRITIN, TIBC, IRON, RETICCTPCT in the last 72 hours. Microbiology No results found for this or any previous visit (from the past 240 hour(s)).   Discharge Instructions:   Discharge Instructions    Diet general    Complete by:  As directed    Discharge instructions    Complete by:  As directed    Home health, 24 hour supervision by family Check BP at home-- bring log to PCP   Increase activity slowly    Complete by:  As directed      Allergies as of 08/09/2016      Reactions   Lovastatin Other (See Comments)   REACTION: leg pain   Statins Other (See Comments)   REACTION: leg cramps, weakness   Sulfa Antibiotics Hives, Itching   Codeine Rash   Niacin Rash   Flushing also      Medication List    STOP taking these medications  aspirin 325 MG tablet   aspirin-acetaminophen-caffeine 250-250-65 MG tablet Commonly known as:  EXCEDRIN MIGRAINE   conjugated estrogens vaginal cream Commonly known as:  PREMARIN   estradiol 0.1 MG/GM vaginal cream Commonly known as:  ESTRACE VAGINAL     TAKE these medications   ALPRAZolam 0.25 MG tablet Commonly known as:  XANAX Take 1 tablet (0.25 mg total) by mouth 2 (two) times daily as needed for anxiety.   amLODipine 5 MG tablet Commonly known as:  NORVASC Take 1 tablet (5 mg total) by mouth daily. Start taking on:  08/10/2016   diphenhydrAMINE 25 MG tablet Commonly known as:  BENADRYL Take 25 mg by mouth every 6 (six) hours as needed for itching.   escitalopram 20 MG tablet Commonly known as:  LEXAPRO TAKE 1 TABLET BY MOUTH DAILY   fenofibrate 54 MG tablet TAKE TWO (2) TABLETS BY MOUTH DAILY   furosemide 20 MG tablet Commonly known as:  LASIX Take 20 mg by mouth daily as needed for fluid.   Vitamin D3 50000 units  Caps 1 cap weekly x 12 weeks What changed:  how much to take  how to take this  when to take this  additional instructions      Follow-up Information    Eliezer Lofts, MD Follow up in 1 week(s).   Specialty:  Family Medicine Why:  for BP check Contact information: [address] [place] [address] [phone]            Time coordinating discharge: 35 min  Signed:  Sun Wilensky Alison Stalling   [facility] 08/09/2016, 2:51 PM

## 2016-08-09 NOTE — Progress Notes (Signed)
RN discussed discharge instructions with patient, daughter, and grand daughter. Patient and family verbalized understanding of checking BP (states they have a bp cuff and meter at home), understand to keep feet flat on floor, log 2x daily. Understand amlodipine rx. Patient and family rescheduled follow up appointment to 12/27. Tele removed, IV removed. Neuro assessment unchanged.

## 2016-08-10 ENCOUNTER — Telehealth: Payer: Self-pay | Admitting: *Deleted

## 2016-08-10 NOTE — Telephone Encounter (Signed)
Spoke to patient by telephone. Transition Care Management Follow-up Telephone Call   Date discharged? 08/09/2016   How have you been since you were released from the hospital? " Has had a slight headache, other than that doing okay"   Do you understand why you were in the hospital? Yes, fell, tripped over a rock   Do you understand the discharge instructions? yes   Where were you discharged to? Home   Items Reviewed:  Medications reviewed: Yes, started on Amlodipine and no aspirin  Allergies reviewed: Yes  Dietary changes reviewed: No changes  Referrals reviewed: Yes, Home health [facility] coming out   Functional Questionnaire:   Activities of Daily Living (ADLs):   She states they are independent in the following: ambulation, bathing and hygiene, feeding, continence, grooming, toileting and dressing Patient stated that her grand daughter is with her if she needs any help States they require assistance with the following: none at this time   Any transportation issues/concerns?: no   Any patient concerns? no   Confirmed importance and date/time of follow-up visits scheduled yes  Provider Appointment booked with Dr. Lorelei Pont 08/17/16 @11 :21  Confirmed with patient if condition begins to worsen call PCP or go to the ER.  Patient was given the office number and encouraged to call back with question or concerns.  : yes

## 2016-08-17 ENCOUNTER — Ambulatory Visit (INDEPENDENT_AMBULATORY_CARE_PROVIDER_SITE_OTHER): Payer: Medicare Other | Admitting: Family Medicine

## 2016-08-17 ENCOUNTER — Encounter: Payer: Self-pay | Admitting: Family Medicine

## 2016-08-17 VITALS — BP 132/76 | HR 83 | Temp 98.6°F | Ht 61.0 in | Wt 185.0 lb

## 2016-08-17 DIAGNOSIS — N184 Chronic kidney disease, stage 4 (severe): Secondary | ICD-10-CM | POA: Diagnosis not present

## 2016-08-17 DIAGNOSIS — S060X0D Concussion without loss of consciousness, subsequent encounter: Secondary | ICD-10-CM

## 2016-08-17 DIAGNOSIS — I629 Nontraumatic intracranial hemorrhage, unspecified: Secondary | ICD-10-CM

## 2016-08-17 DIAGNOSIS — S06350D Traumatic hemorrhage of left cerebrum without loss of consciousness, subsequent encounter: Secondary | ICD-10-CM | POA: Diagnosis not present

## 2016-08-17 DIAGNOSIS — M25331 Other instability, right wrist: Secondary | ICD-10-CM

## 2016-08-17 DIAGNOSIS — S06320D Contusion and laceration of left cerebrum without loss of consciousness, subsequent encounter: Secondary | ICD-10-CM

## 2016-08-17 LAB — BASIC METABOLIC PANEL
BUN: 31 mg/dL — ABNORMAL HIGH (ref 6–23)
CHLORIDE: 100 meq/L (ref 96–112)
CO2: 26 mEq/L (ref 19–32)
Calcium: 9.3 mg/dL (ref 8.4–10.5)
Creatinine, Ser: 1.73 mg/dL — ABNORMAL HIGH (ref 0.40–1.20)
GFR: 29.93 mL/min — AB (ref >60.00)
GLUCOSE: 168 mg/dL — AB (ref 70–99)
Potassium: 4.3 mEq/L (ref 3.5–5.1)
SODIUM: 135 meq/L (ref 135–145)

## 2016-08-17 MED ORDER — PROMETHAZINE HCL 25 MG PO TABS: 25.0000 mg | ORAL_TABLET | Freq: Four times a day (QID) | ORAL | 2 refills | Status: DC | PRN

## 2016-08-17 NOTE — Progress Notes (Signed)
Pre visit review using our clinic review tool, if applicable. No additional management support is needed unless otherwise documented below in the visit note. 

## 2016-08-17 NOTE — Progress Notes (Signed)
Dr. Frederico Hamman T. Reshunda Strider, MD, [facility] [place], [address] Phone: [phone] Fax: [phone]  08/17/2016  Patient: Jill [name], MRN: [id number], DOB: [birth year], 82 y.o.  Primary Physician:  Eliezer Lofts, MD   Chief Complaint  Patient presents with  . Hospitalization Follow-up  . Headache  . Diarrhea   Subjective:   Jill [name] is a 82 y.o. very pleasant female patient who presents with the following:  TCM Note, Dr. Rometta Emery patient.  Admit date: 08/08/2016 Discharge date: 08/09/2016  The patient is been having some  Syncopal episodes, and she presented 2 days after sustaining a fall.Marland Kitchen  She was walking in her back yard and tripped over some tree roots  And fell onto her outstretched hand on both sides and also on her knees.  At that time  She did not lose consciousness per her memory, but she felt quite hazed  And somewhat confused after the initial incident.  Since the initial injury she has become more foggy and confused,, dizzy, having difficulty walking.Marland Kitchen  Per her daughter who presides much of the history, she was also having some blurred vision and generally was not acting herself.  ppatient had  Both a CT and MRI of her brain which showed a 1.7 cm  Occipital parenchymal hemorrhage and trace left tentorial hemorrhages.  Neurosurgery was consulted  In the hospital, and he advised conservative management with observation in the hospital.  Creatinine did increase to 2.0 above baseline of 1.7 while in the hospital which resolved prior to discharge.  Right wrist impact injury.  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  The ER place her in a fiberglass thumb spica splint.  Intraparenchymal hematoma of the brain Concussion CHF CKD  Stop ASA  Having HA, nausea. She continues to have headaches,  These have been waxing and waning and sometimes  more severe and sometimes barely present.  She also is been nauseated quite significantly.  At this point the patient also has been trying to read and do crossword puzzles and do other things.  Concussion with intracranial hemorrhage, indirect mechanism.  Past Medical History, Surgical History, Social History, Family History, Problem List, Medications, and Allergies have been reviewed and updated if relevant.  Patient Active Problem List   Diagnosis Date Noted  . Intraparenchymal hematoma of brain ([place]) 08/08/2016  . Diabetes mellitus with complication ([place]) 76/54/6503  . Wrist injury 08/08/2016  . Chronic diastolic congestive heart failure ([place]) 08/08/2016  . Concussion with no loss of consciousness 08/08/2016  . Intracranial bleed ([facility])   . BPPV (benign paroxysmal positional vertigo) 06/24/2016  . Memory loss 06/24/2016  . Accidental fall 06/24/2016  . Chronic neck pain 03/18/2016  . Autonomic neuropathy due to diabetes ([facility]) 03/18/2016  . Anemia in chronic kidney disease 12/15/2015  . Dyspnea on exertion 12/08/2015  . Peripheral edema 12/08/2015  . Incomplete bladder emptying 10/15/2015  . Abscess, renal/perirenal 10/15/2015  . Recurrent UTI 10/15/2015  . Nocturia 08/21/2015  . Weakness 06/02/2015  . Lumbar back pain with radiculopathy affecting left lower extremity 05/05/2015  . Renal abscess 01/28/2015  . AKI (acute kidney injury) ([facility]) 01/27/2015  . Chronic fatigue 01/22/2015  . Chronic idiopathic urticaria 01/17/2015  . S/P hip replacement 05/16/2014  . DNR (do not resuscitate) 02/20/2014  . ALLERGIC RHINITIS 12/17/2008  . SYNCOPE 10/27/2008  . Vitamin D deficiency 07/01/2008  . CHRONIC  KIDNEY DISEASE STAGE III (MODERATE) 02/11/2008  . DIVERTICULOSIS OF COLON 02/01/2008  . COLONIC POLYPS, ADENOMATOUS 10/19/2006  . Diabetes mellitus due to underlying condition with diabetic autonomic neuropathy ([place]) 10/19/2006  . Hyperlipidemia 10/19/2006  . OBESITY, NOS 10/19/2006    . Major depression, recurrent ([facility]) 10/19/2006  . HYPERTENSION, BENIGN SYSTEMIC 10/19/2006  . Recurrent urticaria 10/19/2006    Past Medical History:  Diagnosis Date  . Allergy   . Arthritis    osteoarthritis   . Chronic kidney disease   . Complication of anesthesia    difficult waking   . Depression   . Diabetes mellitus   . Diverticula, colon   . Family history of anesthesia complication    Son is difficult intubation  . Hyperlipidemia   . Hypertension   . Vasovagal syncope 2006   Negative cardiac workup-myoview, echo    Past Surgical History:  Procedure Laterality Date  . ABDOMINAL HYSTERECTOMY  1977   fibroid  . JOINT REPLACEMENT  2009   rt hip  . TOTAL HIP ARTHROPLASTY Left 05/12/2014   dr Mayer Camel  . TOTAL HIP ARTHROPLASTY Left 05/12/2014   Procedure: TOTAL HIP ARTHROPLASTY;  Surgeon: Kerin Salen, MD;  Location: [facility];  Service: Orthopedics;  Laterality: Left;    Social History   Social History  . Marital status: Widowed    Spouse name: N/A  . Number of children: N/A  . Years of education: N/A   Occupational History  . Not on file.   Social History Main Topics  . Smoking status: Former Smoker    Packs/day: 1.00    Years: 45.00    Types: Cigarettes    Quit date: 06/16/1999  . Smokeless tobacco: Never Used     Comment: quit 15 years ago  . Alcohol use No  . Drug use: No  . Sexual activity: Not Currently   Other Topics Concern  . 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 Diet: healthy diet      Son Antony Haste [place]) [place] , has living will and DNR ( reviewed 2015)                Family History  Problem Relation Age of Onset  . COPD Brother   . Heart disease Brother   . Diabetes  Brother   . Lymphoma Brother   . Alzheimer's disease Brother   . Pancreatic cancer Sister   . Alzheimer's disease Sister   . Heart disease Mother   . Breast cancer Sister   . Leukemia Other   . Kidney disease Neg Hx     Allergies  Allergen Reactions  . Lovastatin Other (See Comments)    REACTION: leg pain  . Statins Other (See Comments)    REACTION: leg cramps, weakness  . Sulfa Antibiotics Hives and Itching  . Codeine Rash  . Niacin Rash    Flushing also    Medication list reviewed and updated in full in [facility].   GEN: No acute illnesses, no fevers, chills. GI: No n/v/d, eating normally Pulm: No SOB HA,  nausea  Otherwise, ROS is as per the HPI.  Objective:   BP 132/76   Pulse 83   Temp  98.6 F (37 C) (Oral)   Ht 5\' 1"  (1.549 m)   Wt 185 lb (83.9 kg)   SpO2 93%   BMI 34.96 kg/m   GEN: WDWN, NAD, Non-toxic, A & O x 3 HEENT: Atraumatic, Normocephalic. Neck supple. No masses, No LAD. Ears and Nose: No external deformity. CV: RRR, No M/G/R. No JVD. No thrill. No extra heart sounds. PULM: CTA B, no wheezes, crackles, rhonchi. No retractions. No resp. distress. No accessory muscle use. EXTR: No c/c/e NEURO Normal gait. PERRLA. Normal mentation on my exam - daughter thinks possibly slightly confused PSYCH: Normally interactive. Conversant. Not depressed or anxious appearing.  Calm demeanor.   Laboratory and Imaging Data: All imaging data reviewed and discussed with patient and daughter.   Dg Chest 2 View  Result Date: 08/08/2016 CLINICAL DATA:  Fall this past Saturday.  Blurred vision.  Diabetes. EXAM: CHEST  2 VIEW COMPARISON:  02/03/2015 radiographs FINDINGS: Atherosclerotic calcification of the aortic arch. Heart size within normal limits given the AP projection. Trace fluid in the left major fissure inferiorly, but otherwise the small pleural effusions shown on the exam from June 2016 have resolved. No pneumothorax.  Mild thoracic spondylosis.  IMPRESSION: 1. Trace fluid or atelectasis along the bottom portion of the left major fissure. However, there is no blunting of the costophrenic angles, and this represents an improvement from 2016. 2. Atherosclerotic aortic arch. Electronically Signed   By: Van Clines M.D.   On: 08/08/2016 12:36   Dg Wrist Complete Right  Result Date: 08/08/2016 CLINICAL DATA:  Fall. EXAM: RIGHT WRIST - COMPLETE 3+ VIEW COMPARISON:  No recent prior. FINDINGS: Widening of the scapholunate space noted consistent scapholunate dissociation. Diffuse degenerative change. No evidence of fracture dislocation. IMPRESSION: 1. 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 2. Diffuse degenerative change. No acute bony abnormality identified. Electronically Signed   By: Marcello Moores  Register   On: 08/08/2016 10:57   Dg Knee 2 Views Left  Result Date: 08/08/2016 CLINICAL DATA:  Fall. EXAM: LEFT KNEE - 1-2 VIEW COMPARISON:  No recent prior. FINDINGS: No acute bony or joint abnormality identified. No evidence of fracture dislocation. Mild tricompartment degenerative change. Small knee joint effusion cannot be excluded. IMPRESSION: Small knee joint effusion cannot be excluded. Mild tricompartment degenerative change. No acute bony abnormality . Electronically Signed   By: Marcello Moores  Register   On: 08/08/2016 10:56   Dg Knee 2 Views Right  Result Date: 08/08/2016 CLINICAL DATA:  Fall . EXAM: RIGHT KNEE - 1-2 VIEW COMPARISON:  No recent prior . FINDINGS: Diffuse osteopenia. No acute bony abnormality P Mild tricompartment degenerative change. Small loose bodies cannot be excluded. Tiny soft tissue calcification noted adjacent to the distal right femur. This most likely benign. IMPRESSION: Diffuse osteopenia mild degenerative change. No acute bony abnormality identified. Electronically Signed   By: Marcello Moores  Register   On: 08/08/2016 10:58   Ct Head Wo Contrast  Result Date: 08/08/2016 CLINICAL DATA:   Headache.  Fall this morning.  Initial encounter. EXAM: CT HEAD WITHOUT CONTRAST TECHNIQUE: Contiguous axial images were obtained from the base of the skull through the vertex without intravenous contrast. COMPARISON:  Brain MRI and CT 05/16/2014 FINDINGS: Brain: There is a 1.7 cm focus of acute parenchymal hemorrhage in the left occipital lobe with a fluid-fluid level and mild surrounding edema. There is mild mass effect on the adjacent left occipital horn. There is asymmetric thin hyperdensity along the left tentorium measuring 1-1.5 mm in thickness and suggestive  of a trace amount of subdural hemorrhage. There is moderate cerebral atrophy, and patchy to confluent bilateral cerebral white matter hypodensities are nonspecific but compatible with moderate chronic small vessel ischemic disease. There is no midline shift. Vascular: Calcified atherosclerosis at the skullbase. No hyperdense vessel. Skull: There is a 7 mm lucent lesion in the right parietal skull which extends to the outer table and has enlarged from the 2015 CT (previously 3 mm). Two smaller lucent lesions in the right frontal and right parietal skull are unchanged. Sinuses/Orbits: Trace left sphenoid sinus mucosal thickening. Clear mastoid air cells. Prior bilateral cataract extraction. Other: None. IMPRESSION: 1. 1.7 cm left occipital lobe parenchymal hemorrhage with mild surrounding edema. 2. Trace subdural hemorrhage along the left tentorium. 3. Moderate chronic small vessel ischemic disease and cerebral atrophy. 4. 7 mm right parietal skull lesion, indeterminate though enlarged since 2015. Critical Value/emergent results were called by telephone at the time of interpretation on 08/08/2016 at 11:38 am to Dr. Ellender Hose, who verbally acknowledged these results. Electronically Signed   By: Logan Bores M.D.   On: 08/08/2016 11:40   Ct Knee Right Wo Contrast  Result Date: 08/08/2016 CLINICAL DATA:  Status post fall.  Right knee pain. EXAM: CT OF THE  RIGHT KNEE WITHOUT CONTRAST TECHNIQUE: Multidetector CT imaging of the RIGHT knee was performed according to the standard protocol. Multiplanar CT image reconstructions were also generated. COMPARISON:  None. FINDINGS: Bones/Joint/Cartilage Generalized osteopenia. Mild medial femorotibial compartment joint space narrowing. Lateral femorotibial compartment joint space is maintained. Moderate lateral patellofemoral compartment joint space narrowing. No acute fracture or dislocation. No lytic or sclerotic osseous lesion. No periosteal reaction or bone destruction. No significant joint effusion. No Baker cyst. Normal alignment. Ligaments Suboptimally assessed by CT. Muscles and Tendons Muscles are normal. No muscle atrophy. Intact quadriceps tendon and patellar tendon. Soft tissues No soft tissue mass. No fluid collection or hematoma. Mild soft tissue edema of the subcutaneous fat involving the anterior proximal right lower leg. IMPRESSION: 1.  No acute osseous injury of the right knee. 2. Mild medial femorotibial compartment and moderate lateral patellofemoral compartment osteoarthritis. Electronically Signed   By: Kathreen Devoid   On: 08/08/2016 13:24   Mr Brain Wo Contrast  Result Date: 08/08/2016 CLINICAL DATA:  82 year old female with CJD, depression, diabetes, hypertension and high lipids presenting with syncopal episode 2 days after falling. Subsequent encounter. EXAM: MRI HEAD WITHOUT CONTRAST TECHNIQUE: Multiplanar, multiecho pulse sequences of the brain and surrounding structures were obtained without intravenous contrast. COMPARISON:  08/08/2016 head CT.  05/16/2014 brain MR. FINDINGS: Exam is motion degraded. Brain: Left occipital 1.5 x 1.4 x 1.6 cm hematoma with surrounding vasogenic edema. Right posterior lenticular nucleus 1 x 0.6 x 0.7 cm subacute to chronic hematoma. No acute thrombotic infarct. Marked chronic microvascular changes. Moderate global atrophy without hydrocephalus. Right calvarial 8 mm  lesion of indeterminate etiology as noted on recent CT. Vascular: Major intracranial vascular structures are patent. Skull and upper cervical spine: Mild transverse ligament hypertrophy. Sinuses/Orbits: Post lens replacement without acute orbital abnormality. Minimal ethmoid sinus air cell mucosal thickening. Other: Negative. IMPRESSION: Exam is motion degraded. Left occipital 1.5 x 1.4 x 1.6 cm hematoma with surrounding vasogenic edema. Etiology indeterminate. This may be related to amyloid angiopathy. No adjacent major dural sinus thrombosis. Right posterior lenticular nucleus 1 x 0.6 x 0.7 cm subacute to chronic hematoma. This may reflect result of prior hemorrhage or remote hemorrhagic infarct and is new since 2015 MR. No acute thrombotic infarct. Marked  chronic microvascular changes. Moderate global atrophy without hydrocephalus. Right calvarial 8 mm lesion of indeterminate etiology as noted on recent CT. Electronically Signed   By: Genia Del M.D.   On: 08/08/2016 19:26   Results for orders placed or performed in visit on 61/95/09  Basic metabolic panel  Result Value Ref Range   Sodium 135 135 - 145 mEq/L   Potassium 4.3 3.5 - 5.1 mEq/L   Chloride 100 96 - 112 mEq/L   CO2 26 19 - 32 mEq/L   Glucose, Bld 168 (H) 70 - 99 mg/dL   BUN 31 (H) 6 - 23 mg/dL   Creatinine, Ser 1.73 (H) 0.40 - 1.20 mg/dL   Calcium 9.3 8.4 - 10.5 mg/dL   GFR 29.93 (L) >60.00 mL/min     Assessment and Plan:   Intraparenchymal hematoma of brain, left, without loss of consciousness, subsequent encounter  Chronic kidney disease (CKD), stage IV (severe) (HCC) - Plan: Basic metabolic panel  Concussion without loss of consciousness, subsequent encounter  Intracranial bleed (HCC)  Scapholunate dissociation of right wrist  Acute intracranial hemorrhage as noted above.  There is also an additional older hematoma,, which may prior hemorrhagic stroke or hematoma that was unknown  Until current imaging.  The patient's  aspirin is discontinued.  Blood pressure control is stable at this point.  Severe concussion.  Significant headache.  Some fogginess, mild confusion, nausea.  I discussed with the patient and her family that this is understandable given the situation and significant head injury that she has had.  Recommended that she have complete physical  And cognitive rest  Until better, nno reading, tablet use, crossword puzzles,  Or other taxing mental challenges.  Recommended rest and classic concussion recommendations were reviewed with the patient.  I reviewed wrist films, probable scapholunate dissociation.  Discontinue fiberglass splint, quite uncomfortable.  Place the patient in a prefabricated thumb spica splint.  Recommended she use this for the next 3-4 weeks and then reassess at follow-up.   If symptoms persist, intra-articular  Wrist injection would be reasonable in an elderly patient with arthritis of the wrist versus for follow-up with hand surgery.   Nevertheless,  Nothing aggressive could be undertaken until stable from a intracranial hemorrhage and brain standpoint.  All questions answered.   All data reviewed with the patient including all images and and questions answered.  Follow-up: Return in about 1 month (around 09/17/2016).  Meds ordered this encounter  Medications  . promethazine (PHENERGAN) 25 MG tablet    Sig: Take 1 tablet (25 mg total) by mouth every 6 (six) hours as needed for nausea or vomiting.    Dispense:  30 tablet    Refill:  2   There are no discontinued medications. Orders Placed This Encounter  Procedures  . Basic metabolic panel    Signed,  Frederico Hamman T. Lori-Ann Lindfors, MD   Allergies as of 08/17/2016      Reactions   Lovastatin Other (See Comments)   REACTION: leg pain   Statins Other (See Comments)   REACTION: leg cramps, weakness   Sulfa Antibiotics Hives, Itching   Codeine Rash   Niacin Rash   Flushing also      Medication List       Accurate as of  08/17/16 11:59 PM. Always use your most recent med list.          ALPRAZolam 0.25 MG tablet Commonly known as:  XANAX Take 1 tablet (0.25 mg total) by mouth 2 (two) times  daily as needed for anxiety.   amLODipine 5 MG tablet Commonly known as:  NORVASC Take 1 tablet (5 mg total) by mouth daily.   diphenhydrAMINE 25 MG tablet Commonly known as:  BENADRYL Take 25 mg by mouth every 6 (six) hours as needed for itching.   escitalopram 20 MG tablet Commonly known as:  LEXAPRO TAKE 1 TABLET BY MOUTH DAILY   fenofibrate 54 MG tablet TAKE TWO (2) TABLETS BY MOUTH DAILY   furosemide 20 MG tablet Commonly known as:  LASIX Take 20 mg by mouth daily as needed for fluid.   promethazine 25 MG tablet Commonly known as:  PHENERGAN Take 1 tablet (25 mg total) by mouth every 6 (six) hours as needed for nausea or vomiting.   Vitamin D3 50000 units Caps 1 cap weekly x 12 weeks

## 2016-08-19 ENCOUNTER — Ambulatory Visit: Payer: Medicare Other | Admitting: Family Medicine

## 2016-08-23 ENCOUNTER — Telehealth: Payer: Self-pay | Admitting: Family Medicine

## 2016-08-23 ENCOUNTER — Encounter: Payer: Self-pay | Admitting: Family Medicine

## 2016-08-23 NOTE — Telephone Encounter (Signed)
Patient Name: Jill [name] Gender: Female DOB: Nov 09, 1931 Age: 83 Y 75 M 12 D Return Phone Number: [phone] (Primary), [phone] (Secondary) Address[place] [address] Client [facility] Night - Client Client Site [place] Physician Eliezer Lofts - MD Contact Type Call Who Is Calling Patient / Member / Family / Caregiver Call Type Triage / Clinical Caller Name Markayla Reichart Relationship To Patient Daughter Return Phone Number [phone] (Primary) Chief Complaint HEAD INJURY - and not acting right. Change in behaviour after hitting head. Reason for Call Symptomatic / Request for Jill [name] says, mother was seen on Wed, was in hospital w/ bleeding on brain, due to concussion after a fall. Today she has no coordination, unable to stand. PreDisposition Did not know what to do Translation No Nurse Assessment Nurse: Beryl Meager, RN, Mickel Baas Date/Time Jill [name] Time): 08/20/2016 9:42:47 AM Confirm and document reason for call. If symptomatic, describe symptoms. ---Caller says, mother was seen on Wed, was in hospital w/ bleeding on brain, due to concussion after a fall. Today she has no coordination, unable to stand. Saw Dr. Edilia Bo on Wednesday. Does the patient have any new or worsening symptoms? ---Yes Will a triage be completed? ---Yes Related visit to physician within the last 2 weeks? ---Yes Does the PT have any chronic conditions? (i.e. diabetes, asthma, etc.) ---Yes List chronic conditions. ---DM, stage 3 kidney disease, has chf, Is this a behavioral health or substance abuse call? ---No Guidelines Guideline Title Affirmed Question Affirmed Notes Nurse Date/Time (Eastern Time) Head Injury [1] ACUTE NEURO SYMPTOM AND [2] present now (DEFINITION: difficult to awaken OR confused thinking and talking OR slurred speech OR Nolene [name] 08/20/2016 9:46:45 AM PLEASE  NOTE: All timestamps contained within this report are represented as [place] Standard Time. CONFIDENTIALTY NOTICE: This fax transmission is intended only for the addressee. It contains information that is legally privileged, confidential or otherwise protected from use or disclosure. If you are not the intended recipient, you are strictly prohibited from reviewing, disclosing, copying using or disseminating any of this information or taking any action in reliance on or regarding this information. If you have received this fax in error, please notify us immediately by telephone so that we can arrange for its return to [place]. Phone: [phone], Toll-Free: [phone], Fax: [phone] Page: 2 of 2 Call Id: [id number] Guidelines Guideline Title Affirmed Question Affirmed Notes Nurse Date/Time Jill [name] Time) weakness of arms OR unsteady walking) Disp. Time Jill [name] Time) Disposition Final User 08/20/2016 9:40:24 AM Send to Urgent [name] 08/20/2016 9:51:46 AM Send To RN Personal Beryl Meager, RN, Mickel Baas 08/20/2016 10:09:26 AM 911 Outcome Documentation Beryl Meager, RN, Mickel Baas Reason: Spoke with caller, they are waiting on ambulance right now. 08/20/2016 10:09:32 AM Call Completed Beryl Meager RN, Mickel Baas 08/20/2016 9:51:23 AM Call EMS 911 Now Yes Beryl Meager, RN, Elige Radon Understands: Yes Disagree/Comply: Comply Care Advice Given Per Guideline CALL EMS 911 NOW: Immediate medical attention is needed. You need to hang up and call 911 (or an ambulance). Psychologist, forensic Discretion: I'll call you back in a few minutes to be sure you were able to reach them.) CARE ADVICE given per Head Injury (Adult) guideline. Comments User: [name], RN Date/Time Jill [name] Time): 08/20/2016 9:43:51 AM no appetite, not able to control her body User: [name], RN Date/Time Jill [name] Time): 08/20/2016 9:45:17 AM [facility] 2 weeks ago, was seen in hospital at that time. was seen on Wed for follow up. atthat time could still get  up but is getting worse User: [name], RN Date/Time Jill [name] Time): 08/20/2016 9:46:34 AM has severe headaches, taking exrtra strength tylenol, ok by Dr Edilia Bo User: [name], RN Date/Time Jill [name] Time): 08/20/2016 9:48:11 AM currently are in [place], [place] User: [name], RN Date/Time Jill [name] Time): 08/20/2016 9:49:26 AM is having difficulty regulating temperature User: [name], RN Date/Time Jill [name] Time): 08/20/2016 9:49:56 AM bleed is right posterior occipital lobe Referrals GO TO FACILITY UNDECIDED

## 2016-08-23 NOTE — Telephone Encounter (Signed)
Left message for Estill Bamberg to return my call to verify that Ms. Duplessis was evaluated at the ER over the weekend.

## 2016-08-23 NOTE — Telephone Encounter (Signed)
Agree. Pt needs to go to ER ASAP. Make sure she has please.

## 2016-08-23 NOTE — Telephone Encounter (Signed)
Per My Chart Note:  Jill [name] has been admitted to [facility] in [place]. Right lobe pneumonia and ecoli UTI. May release on Friday to rehabilitation facility in [place]. Doesn't recommend [facility] at this time. Room has limited cell phone service.

## 2016-08-26 ENCOUNTER — Ambulatory Visit: Payer: Medicare Other | Admitting: Family Medicine

## 2016-09-01 ENCOUNTER — Encounter: Payer: Self-pay | Admitting: Family Medicine

## 2016-09-02 ENCOUNTER — Ambulatory Visit (INDEPENDENT_AMBULATORY_CARE_PROVIDER_SITE_OTHER): Payer: Medicare Other | Admitting: Family Medicine

## 2016-09-02 ENCOUNTER — Encounter: Payer: Self-pay | Admitting: Family Medicine

## 2016-09-02 VITALS — BP 134/68 | HR 90 | Temp 98.3°F | Ht 61.0 in

## 2016-09-02 DIAGNOSIS — E0843 Diabetes mellitus due to underlying condition with diabetic autonomic (poly)neuropathy: Secondary | ICD-10-CM | POA: Diagnosis not present

## 2016-09-02 DIAGNOSIS — R531 Weakness: Secondary | ICD-10-CM

## 2016-09-02 DIAGNOSIS — E118 Type 2 diabetes mellitus with unspecified complications: Secondary | ICD-10-CM | POA: Diagnosis not present

## 2016-09-02 DIAGNOSIS — I10 Essential (primary) hypertension: Secondary | ICD-10-CM | POA: Diagnosis not present

## 2016-09-02 DIAGNOSIS — R29898 Other symptoms and signs involving the musculoskeletal system: Secondary | ICD-10-CM | POA: Diagnosis not present

## 2016-09-02 DIAGNOSIS — J189 Pneumonia, unspecified organism: Secondary | ICD-10-CM

## 2016-09-02 DIAGNOSIS — S6990XA Unspecified injury of unspecified wrist, hand and finger(s), initial encounter: Secondary | ICD-10-CM

## 2016-09-02 DIAGNOSIS — N184 Chronic kidney disease, stage 4 (severe): Secondary | ICD-10-CM | POA: Diagnosis not present

## 2016-09-02 DIAGNOSIS — S060X0D Concussion without loss of consciousness, subsequent encounter: Secondary | ICD-10-CM | POA: Diagnosis not present

## 2016-09-02 DIAGNOSIS — S06350D Traumatic hemorrhage of left cerebrum without loss of consciousness, subsequent encounter: Secondary | ICD-10-CM | POA: Diagnosis not present

## 2016-09-02 DIAGNOSIS — N390 Urinary tract infection, site not specified: Secondary | ICD-10-CM | POA: Diagnosis not present

## 2016-09-02 DIAGNOSIS — S06320D Contusion and laceration of left cerebrum without loss of consciousness, subsequent encounter: Secondary | ICD-10-CM

## 2016-09-02 DIAGNOSIS — B9620 Unspecified Escherichia coli [E. coli] as the cause of diseases classified elsewhere: Secondary | ICD-10-CM | POA: Insufficient documentation

## 2016-09-02 DIAGNOSIS — N179 Acute kidney failure, unspecified: Secondary | ICD-10-CM | POA: Diagnosis not present

## 2016-09-02 MED ORDER — LEVOFLOXACIN 500 MG PO TABS: 500.0000 mg | ORAL_TABLET | Freq: Every day | ORAL | 0 refills | Status: DC

## 2016-09-02 NOTE — Assessment & Plan Note (Signed)
Follow at home. Per daughter.. No increase in hospitalization in [place].

## 2016-09-02 NOTE — Progress Notes (Signed)
Subjective:    Patient ID: Jill [name], female    DOB: May 19, 1932, 83 y.o.   MRN: [id number]  HPI   83 year old female presents for  Hospital follow up. This follow up for second hospital readmission in 1 month.   Admitted 12/18 to 08/09/2016 for intraparenchymal hemorrhage after  Fall and head injury. Both a CT and MRI of her brain which showed a 1.7 cm  Occipital parenchymal hemorrhage and trace left tentorial hemorrhages.  Neurosurgery was consulted  In the hospital, and he advised conservative management with observation in the hospital.  Creatinine did increase to 2.0 above baseline of 1.7 while in the hospital which resolved prior to discharge.  Right wrist impact injury.  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    She was readmitted to [facility] in [place] for pneumonia and ecoli UTI.  Admitted 12/30. Discharged 1/4.  Admitted for confusion, weakness. Fever 101 F. Requested records.   Per daughter: wbc 22 CXR showed R lung PNA UA positive, cath... Ecoli   Blood culture Head CT: stable Treated vanc and ? Cephalosporin IV.  No further antibiotics needed.  Set up with [facility] in [place]... Dizzy and Bp dropped with PT.  Today: still weak, no further fever.  Still cough, productive. Mild shortness of breath.  Still some confusion. No dysuria, still has odor.  Decreased appetite. Looking at food makes her nauseous. Using some protein shakes.  has not been checking sugars.. But not running high I hospital.   No further headaches.  Wt Readings from Last 3 Encounters:  08/17/16 185 lb (83.9 kg)  08/08/16 185 lb (83.9 kg)  07/05/16 187 lb 8 oz (85 kg)  Baseline GFR 30, Cr 1.6   Social History /Family History/Past Medical History reviewed and updated if needed.   Review of Systems  Constitutional: Negative for fatigue and fever.  HENT: Negative for ear pain.   Eyes: Negative for pain.  Respiratory:  Negative for chest tightness and shortness of breath.   Cardiovascular: Negative for chest pain, palpitations and leg swelling.  Gastrointestinal: Negative for abdominal pain.  Genitourinary: Negative for dysuria.       Vitals:   09/02/16 1554  BP: 134/68  Pulse: 90  Temp: 98.3 F (36.8 C)    Objective:   Physical Exam  Constitutional: Vital signs are normal. She appears well-developed and well-nourished. She is cooperative.  Non-toxic appearance. She does not appear ill. No distress.  In wheechair  HENT:  Head: Normocephalic.  Right Ear: Hearing, tympanic membrane, external ear and ear canal normal. Tympanic membrane is not erythematous, not retracted and not bulging.  Left Ear: Hearing, tympanic membrane, external ear and ear canal normal. Tympanic membrane is not erythematous, not retracted and not bulging.  Nose: No mucosal edema or rhinorrhea. Right sinus exhibits no maxillary sinus tenderness and no frontal sinus tenderness. Left sinus exhibits no maxillary sinus tenderness and no frontal sinus tenderness.  Mouth/Throat: Uvula is midline, oropharynx is clear and moist and mucous membranes are normal.  Eyes: Conjunctivae, EOM and lids are normal. Pupils are equal, round, and reactive to light. Lids are everted and swept, no foreign bodies found.  Neck: Trachea normal and normal range of motion. Neck supple. Carotid bruit is not present. No thyroid mass and no thyromegaly present.  Cardiovascular: Normal rate, regular rhythm, S1 normal, S2 normal, normal heart sounds, intact distal pulses and normal pulses.  Exam reveals  no gallop and no friction rub.   No murmur heard. Pulmonary/Chest: Effort normal. No tachypnea. No respiratory distress. She has decreased breath sounds. She has no wheezes. She has rhonchi. She has no rales.  Rhonchi throughout  Abdominal: Soft. Normal appearance and bowel sounds are normal. There is no tenderness.  Musculoskeletal:  Weakness in bilateral lower  extremities 4/5 throughout  Neurological: She is alert.  Skin: Skin is warm, dry and intact. No rash noted.  Psychiatric: Her speech is normal and behavior is normal. Judgment and thought content normal. Her mood appears not anxious. Cognition and memory are normal. She does not exhibit a depressed mood.          Assessment & Plan:

## 2016-09-02 NOTE — Patient Instructions (Addendum)
Stop at front desk for home health. Complete a course of levofloxacin x 7 days for continued pneumonia. Push fluids. Rest. Can take probiotic Culturelle/Align while on antibiotics. Follow up in 1 week 30 min OV call sooner if not improving as expected.

## 2016-09-02 NOTE — Progress Notes (Signed)
Pre visit review using our clinic review tool, if applicable. No additional management support is needed unless otherwise documented below in the visit note. 

## 2016-09-02 NOTE — Assessment & Plan Note (Signed)
Given continued significant symptoms.. Will repeat antibiotics levofloxacin x 7 days.  Close follow up.

## 2016-09-02 NOTE — Assessment & Plan Note (Signed)
Likely due to deconditioning and recent illness. Set up [facility] PT in [place].

## 2016-09-02 NOTE — Assessment & Plan Note (Signed)
Kidney function returned to normal at [facility] discharge.. [place]eval needed.

## 2016-09-02 NOTE — Assessment & Plan Note (Signed)
Well controlled. Continue current medication.  

## 2016-09-02 NOTE — Assessment & Plan Note (Signed)
Likely resolved.. But was unable to get a test of cure UA in office today.

## 2016-09-02 NOTE — Assessment & Plan Note (Signed)
Concussion symptoms resolved with brain rest.

## 2016-09-02 NOTE — Assessment & Plan Note (Signed)
Pain improved, wearing splint as directed. Follow up with Dr. Lorelei Pont at end of month for re-assessment.

## 2016-09-06 ENCOUNTER — Ambulatory Visit: Payer: Self-pay | Admitting: Family Medicine

## 2016-09-09 ENCOUNTER — Ambulatory Visit: Payer: Self-pay | Admitting: Family Medicine

## 2016-09-13 ENCOUNTER — Ambulatory Visit (INDEPENDENT_AMBULATORY_CARE_PROVIDER_SITE_OTHER): Payer: Medicare Other | Admitting: Family Medicine

## 2016-09-13 ENCOUNTER — Encounter: Payer: Self-pay | Admitting: Family Medicine

## 2016-09-13 VITALS — BP 144/64 | HR 95 | Temp 97.9°F | Ht 61.0 in | Wt 176.8 lb

## 2016-09-13 DIAGNOSIS — S06350D Traumatic hemorrhage of left cerebrum without loss of consciousness, subsequent encounter: Secondary | ICD-10-CM

## 2016-09-13 DIAGNOSIS — J449 Chronic obstructive pulmonary disease, unspecified: Secondary | ICD-10-CM | POA: Diagnosis not present

## 2016-09-13 DIAGNOSIS — J189 Pneumonia, unspecified organism: Secondary | ICD-10-CM | POA: Diagnosis not present

## 2016-09-13 DIAGNOSIS — S06320D Contusion and laceration of left cerebrum without loss of consciousness, subsequent encounter: Secondary | ICD-10-CM

## 2016-09-13 DIAGNOSIS — N390 Urinary tract infection, site not specified: Secondary | ICD-10-CM | POA: Diagnosis not present

## 2016-09-13 DIAGNOSIS — N184 Chronic kidney disease, stage 4 (severe): Secondary | ICD-10-CM

## 2016-09-13 LAB — BASIC METABOLIC PANEL
BUN: 35 mg/dL — AB (ref 6–23)
CHLORIDE: 104 meq/L (ref 96–112)
CO2: 29 mEq/L (ref 19–32)
Calcium: 9.6 mg/dL (ref 8.4–10.5)
Creatinine, Ser: 1.63 mg/dL — ABNORMAL HIGH (ref 0.40–1.20)
GFR: 32.06 mL/min — ABNORMAL LOW (ref >60.00)
Glucose, Bld: 118 mg/dL — ABNORMAL HIGH (ref 70–99)
Potassium: 4.3 mEq/L (ref 3.5–5.1)
Sodium: 138 mEq/L (ref 135–145)

## 2016-09-13 LAB — POC URINALSYSI DIPSTICK (AUTOMATED)
BILIRUBIN UA: NEGATIVE
Glucose, UA: NEGATIVE
KETONES UA: NEGATIVE
Nitrite, UA: NEGATIVE
Spec Grav, UA: 1.025
Urobilinogen, UA: 0.2
pH, UA: 6

## 2016-09-13 MED ORDER — TIOTROPIUM BROMIDE MONOHYDRATE 18 MCG IN CAPS: 18.0000 ug | ORAL_CAPSULE | Freq: Every day | RESPIRATORY_TRACT | 12 refills | Status: DC

## 2016-09-13 NOTE — Assessment & Plan Note (Signed)
Improving concussion symptoms.  Off ASA at this time.

## 2016-09-13 NOTE — Addendum Note (Signed)
Addended by: Carter Kitten on: 09/13/2016 03:14 PM   Modules accepted: Orders

## 2016-09-13 NOTE — Patient Instructions (Addendum)
Start trial of spiriva. Use albuterol for rescue for SOB and cough. Stop at lab on way out.

## 2016-09-13 NOTE — Assessment & Plan Note (Signed)
Resolved. No further indication for antibiotics.

## 2016-09-13 NOTE — Assessment & Plan Note (Signed)
Likely cause of pt SOB at baseline and reason cough is lingering. Start spiriva daily . Follow up in 1 months with spirometry in office.  Use albuterol as needed.

## 2016-09-13 NOTE — Progress Notes (Signed)
   Subjective:    Patient ID: Jill [name], female    DOB: 1932/02/28, 83 y.o.   MRN: [id number]  HPI   83 year old female presents with her daughter for follow up  multiple issues including EColi UTI,PNA and head injury causing intraparenchymal hemorrhage.   At last OV 1/12 she was continued on  levaquin for 7 more days Set up for home health.  Concussion symptoms were improving with brain rest.  Today:  She is feeling stronger overall. Feeling better overall.  Can walk without walker, did not need wheelchair to come into the office. Her cough is improving overtime. SOB is improved but not quite back at baseline. No fever.  Using albuterol 2-3 times daily for cough. Feels like this open things up overall.  Hx of smoking 25 pack year history, Quit 15 years ago.  She needs re-eval of BMET to re-assess Cr given ARF in hopsital Unable to keep test of cure for urine at last OV.. Will do today. No current urinary symptoms.  She is eating better overall. Wt Readings from Last 3 Encounters:  09/13/16 176 lb 12 oz (80.2 kg)  08/17/16 185 lb (83.9 kg)  08/08/16 185 lb (83.9 kg)     Review of Systems  Constitutional: Positive for fatigue. Negative for fever.  HENT: Negative for ear pain.   Eyes: Negative for redness.  Respiratory: Positive for cough and shortness of breath.   Cardiovascular: Negative for chest pain, palpitations and leg swelling.  Gastrointestinal: Negative for abdominal distention.       Objective:   Physical Exam  Constitutional: Vital signs are normal. She appears well-developed and well-nourished. She is cooperative.  Non-toxic appearance. She does not appear ill. No distress.  In wheechair  HENT:  Head: Normocephalic.  Right Ear: Hearing, tympanic membrane, external ear and ear canal normal. Tympanic membrane is not erythematous, not retracted and not bulging.  Left Ear: Hearing, tympanic membrane, external ear and ear canal normal. Tympanic membrane is  not erythematous, not retracted and not bulging.  Nose: No mucosal edema or rhinorrhea. Right sinus exhibits no maxillary sinus tenderness and no frontal sinus tenderness. Left sinus exhibits no maxillary sinus tenderness and no frontal sinus tenderness.  Mouth/Throat: Uvula is midline, oropharynx is clear and moist and mucous membranes are normal.  Eyes: Conjunctivae, EOM and lids are normal. Pupils are equal, round, and reactive to light. Lids are everted and swept, no foreign bodies found.  Neck: Trachea normal and normal range of motion. Neck supple. Carotid bruit is not present. No thyroid mass and no thyromegaly present.  Cardiovascular: Normal rate, regular rhythm, S1 normal, S2 normal, normal heart sounds, intact distal pulses and normal pulses.  Exam reveals no gallop and no friction rub.   No murmur heard. Pulmonary/Chest: Effort normal. No tachypnea. No respiratory distress. She has no decreased breath sounds. She has no wheezes. She has no rhonchi. She has no rales.  Rhonchi throughout  Abdominal: Soft. Normal appearance and bowel sounds are normal. There is no tenderness.  Neurological: She is alert.  Skin: Skin is warm, dry and intact. No rash noted.  Psychiatric: Her speech is normal and behavior is normal. Judgment and thought content normal. Her mood appears not anxious. Cognition and memory are normal. She does not exhibit a depressed mood.          Assessment & Plan:

## 2016-09-13 NOTE — Progress Notes (Signed)
Pre visit review using our clinic review tool, if applicable. No additional management support is needed unless otherwise documented below in the visit note. 

## 2016-09-13 NOTE — Addendum Note (Signed)
Addended by: Carter Kitten on: 09/13/2016 03:39 PM   Modules accepted: Orders

## 2016-09-16 ENCOUNTER — Telehealth: Payer: Self-pay

## 2016-09-16 NOTE — Telephone Encounter (Signed)
Keisa with [facility] left v/m; home health PT will be going to pts home on 09/19/16 to start [facility] services. Keisa request cb.

## 2016-09-16 NOTE — Telephone Encounter (Signed)
Per note from [place] . Pt decided against HH.

## 2016-09-17 LAB — URINE CULTURE

## 2016-09-20 ENCOUNTER — Ambulatory Visit: Payer: Medicare Other | Admitting: Family Medicine

## 2016-09-22 ENCOUNTER — Encounter: Payer: Self-pay | Admitting: Family Medicine

## 2016-09-22 ENCOUNTER — Other Ambulatory Visit: Payer: Self-pay | Admitting: Family Medicine

## 2016-09-22 ENCOUNTER — Ambulatory Visit (INDEPENDENT_AMBULATORY_CARE_PROVIDER_SITE_OTHER)
Admission: RE | Admit: 2016-09-22 | Discharge: 2016-09-22 | Disposition: A | Discharge status: Home / Self Care | Payer: Medicare Other | Source: Ambulatory Visit | Attending: Family Medicine | Admitting: Family Medicine

## 2016-09-22 DIAGNOSIS — R938 Abnormal findings on diagnostic imaging of other specified body structures: Secondary | ICD-10-CM

## 2016-09-22 DIAGNOSIS — R9389 Abnormal findings on diagnostic imaging of other specified body structures: Secondary | ICD-10-CM

## 2016-09-23 ENCOUNTER — Other Ambulatory Visit: Payer: Self-pay | Admitting: Family Medicine

## 2016-09-23 ENCOUNTER — Encounter: Payer: Self-pay | Admitting: Family Medicine

## 2016-09-23 MED ORDER — LINEZOLID 600 MG PO TABS: 600.0000 mg | ORAL_TABLET | Freq: Two times a day (BID) | ORAL | 0 refills | Status: DC

## 2016-09-23 NOTE — Telephone Encounter (Signed)
Estill Bamberg (daughter) notified as instructed by MyChart message. ID doctor paged for Dr. Diona Browner to speak with.  Zyvox was approved by ID.  Rx sent to [facility].  Amanda notified via [place].

## 2016-09-23 NOTE — Telephone Encounter (Signed)
Estill Bamberg notified as instructed by telephone.  She is wanting to know do we need to repeat the urine culture once [name] is done taking the linezolid.  Please advise.  Ok to send a [employer].

## 2016-09-23 NOTE — Telephone Encounter (Signed)
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 Only a specific medication can treat this.  I have to contact infectious disease to get approval to prescribe this medication.  Butch Penny.. Please call ID on call today for me to speak with them.   Also see CXR result recs.

## 2016-09-23 NOTE — Telephone Encounter (Signed)
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 Pt needs to take 5 days of linezolid BID.Marland Kitchen Sent to [place].. Will be expensive no cheaper option. Have gotten ID approval.  While on decrease dose of lexapro by half ( 10 mg daily)

## 2016-10-17 ENCOUNTER — Ambulatory Visit (INDEPENDENT_AMBULATORY_CARE_PROVIDER_SITE_OTHER): Payer: Medicare Other | Admitting: Primary Care

## 2016-10-17 ENCOUNTER — Encounter: Payer: Self-pay | Admitting: Primary Care

## 2016-10-17 VITALS — BP 112/70 | HR 95 | Temp 98.5°F | Ht 61.0 in | Wt 163.1 lb

## 2016-10-17 DIAGNOSIS — R300 Dysuria: Secondary | ICD-10-CM

## 2016-10-17 DIAGNOSIS — L03312 Cellulitis of back [any part except buttock]: Secondary | ICD-10-CM | POA: Diagnosis not present

## 2016-10-17 DIAGNOSIS — N390 Urinary tract infection, site not specified: Secondary | ICD-10-CM

## 2016-10-17 DIAGNOSIS — T3695XA Adverse effect of unspecified systemic antibiotic, initial encounter: Secondary | ICD-10-CM | POA: Diagnosis not present

## 2016-10-17 DIAGNOSIS — B379 Candidiasis, unspecified: Secondary | ICD-10-CM | POA: Diagnosis not present

## 2016-10-17 LAB — POC URINALSYSI DIPSTICK (AUTOMATED)
BILIRUBIN UA: NEGATIVE
Glucose, UA: NEGATIVE
KETONES UA: NEGATIVE
Nitrite, UA: NEGATIVE
PH UA: 6
RBC UA: NEGATIVE
SPEC GRAV UA: 1.02
Urobilinogen, UA: NEGATIVE

## 2016-10-17 MED ORDER — FLUCONAZOLE 150 MG PO TABS: 150.0000 mg | ORAL_TABLET | Freq: Once | ORAL | 0 refills | Status: AC

## 2016-10-17 MED ORDER — CEPHALEXIN 500 MG PO CAPS: 500.0000 mg | ORAL_CAPSULE | Freq: Two times a day (BID) | ORAL | 0 refills | Status: DC

## 2016-10-17 NOTE — Progress Notes (Signed)
Pre visit review using our clinic review tool, if applicable. No additional management support is needed unless otherwise documented below in the visit note. 

## 2016-10-17 NOTE — Progress Notes (Signed)
Subjective:    Patient ID: Jill [name], female    DOB: Apr 08, 1932, 83 y.o.   MRN: [id number]  HPI  [name] is an 83 year old female with a history of UTI and type 2 diabetes who presents today with multiple complaints.  1) Dysuria: History of UTI's with the last one being in late January.  She also reports urgency, fevers, hematuria. She's had vaginal itching and rash. She completed a course of linezolid for a resistant UTI in early February. She did feel better after she completed the linezolid but her symptoms returned 1 week ago. She denies fevers over the last several days.  2) Skin Infection: She first noticed this in November/December 2017 as a cyst like growth. She presented to the dermatologist in December who couldn't give her an explanation or remove the cyst. Since then noticed gradual increase in size. Over the last 1 week she's noticed soreness, redness, and yellow puss.  Review of Systems  Constitutional: Positive for fever. Negative for fatigue.  Genitourinary: Positive for dysuria and urgency. Negative for vaginal discharge.       Vaginal itching  Skin: Positive for color change and wound.       Past Medical History:  Diagnosis Date  . Allergy   . Arthritis    osteoarthritis   . Chronic kidney disease   . Complication of anesthesia    difficult waking   . Depression   . Diabetes mellitus   . Diverticula, colon   . Family history of anesthesia complication    Son is difficult intubation  . Hyperlipidemia   . Hypertension   . Vasovagal syncope 2006   Negative cardiac workup-myoview, echo     Social History   Social History  . Marital status: Widowed    Spouse name: N/A  . Number of children: N/A  . Years of education: N/A   Occupational History  . Not on file.   Social History Main Topics  . Smoking status: Former Smoker    Packs/day: 1.00    Years: 45.00    Types: Cigarettes    Quit date: 06/16/1999  . Smokeless tobacco: Never Used   Comment: quit 15 years ago  . Alcohol use No  . Drug use: No  . Sexual activity: Not Currently   Other Topics Concern  . 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 Diet: healthy diet      Son Jill [name]) [place] , has living will and DNR ( reviewed 2015)                Past Surgical History:  Procedure Laterality Date  . ABDOMINAL HYSTERECTOMY  1977   fibroid  . JOINT REPLACEMENT  2009   rt hip  . TOTAL HIP ARTHROPLASTY Left 05/12/2014   dr Mayer Camel  . TOTAL HIP ARTHROPLASTY Left 05/12/2014   Procedure: TOTAL HIP ARTHROPLASTY;  Surgeon: Kerin Salen, MD;  Location: [facility];  Service: Orthopedics;  Laterality: Left;    Family History  Problem Relation Age of Onset  . COPD Brother   . Heart disease Brother   . Diabetes Brother   . Lymphoma Brother   . Alzheimer's disease Brother   .  Pancreatic cancer Sister   . Alzheimer's disease Sister   . Heart disease Mother   . Breast cancer Sister   . Leukemia Other   . Kidney disease Neg Hx     Allergies  Allergen Reactions  . Lovastatin Other (See Comments)    REACTION: leg pain  . Statins Other (See Comments)    REACTION: leg cramps, weakness  . Sulfa Antibiotics Hives and Itching  . Codeine Rash  . Niacin Rash    Flushing also    Current Outpatient Prescriptions on File Prior to Visit  Medication Sig Dispense Refill  . ALPRAZolam (XANAX) 0.25 MG tablet Take 1 tablet (0.25 mg total) by mouth 2 (two) times daily as needed for anxiety.    . Cholecalciferol (VITAMIN D3) 50000 units CAPS 1 cap weekly x 12 weeks (Patient taking differently: Take 50,000 Units by mouth once a week. ) 12 capsule 0  . diphenhydrAMINE (BENADRYL) 25 MG tablet Take 25 mg by mouth every 6  (six) hours as needed for itching.    . escitalopram (LEXAPRO) 20 MG tablet TAKE 1 TABLET BY MOUTH DAILY 30 tablet 5  . fenofibrate 54 MG tablet TAKE TWO (2) TABLETS BY MOUTH DAILY 180 tablet 3  . furosemide (LASIX) 20 MG tablet Take 20 mg by mouth daily as needed for fluid.     Marland Kitchen linezolid (ZYVOX) 600 MG tablet Take 1 tablet (600 mg total) by mouth 2 (two) times daily. 10 tablet 0  . promethazine (PHENERGAN) 25 MG tablet Take 1 tablet (25 mg total) by mouth every 6 (six) hours as needed for nausea or vomiting. 30 tablet 2  . tiotropium (SPIRIVA HANDIHALER) 18 MCG inhalation capsule Place 1 capsule (18 mcg total) into inhaler and inhale daily. 30 capsule 12  . amLODipine (NORVASC) 5 MG tablet Take 1 tablet (5 mg total) by mouth daily. (Patient not taking: Reported on 09/02/2016) 30 tablet 0   No current facility-administered medications on file prior to visit.     BP 112/70   Pulse 95   Temp 98.5 F (36.9 C) (Oral)   Ht 5\' 1"  (1.549 m)   Wt 163 lb 1.9 oz (74 kg)   SpO2 96%   BMI 30.82 kg/m    Objective:   Physical Exam  Constitutional: She appears well-nourished.  Neck: Neck supple.  Cardiovascular: Normal rate and regular rhythm.   Pulmonary/Chest: Effort normal and breath sounds normal.  Abdominal: There is no CVA tenderness.  Skin: Skin is warm. There is erythema.  3 cm of erythema with deep mass, circular. 1 cm fluctuant, circular mass on top of larger mass representative of abscess.          Assessment & Plan:  Cellulitis:  Located to left mid back x 1 week. Exam today with fluctuant abscess that was I&D'd. Consent obtained. Site prepped with betadine soap. Local anesthetic of lidocaine 1% with epi injected. Small incision made to fluctuant site with expulsion of yellow puss. Expelled moderate amount of purulent drainage. Site cleansed. Dressing applied. Home instructions discussed. Treat with oral keflex x 10 days. Follow up PRN.  Sheral Flow, NP

## 2016-10-17 NOTE — Patient Instructions (Signed)
Start Cephalexin antibiotics. Take 1 capsule by mouth twice daily for 10 days.  Take the fluconazole 150 mg tablet. Take 1 tablet by mouth once for vaginal yeast infection.  Keep the infection dry. Change bandages daily.  I will be in touch with you if the bacteria is resistant to the antibiotic.  It was a pleasure meeting you!

## 2016-10-17 NOTE — Assessment & Plan Note (Signed)
Symptoms started x 1 week. Did complete linezolid in early February and felt improved. UA today with 2+ leuks, negative nitrites, no blood. Culture sent. Treat with oral cephalexin given cellulitis. Sulfa allergy. Discussed return precautions.

## 2016-10-17 NOTE — Addendum Note (Signed)
Addended by: Jacqualin Combes on: 10/17/2016 12:58 PM   Modules accepted: Orders

## 2016-10-19 LAB — URINE CULTURE

## 2016-10-26 ENCOUNTER — Encounter: Payer: Self-pay | Admitting: Primary Care

## 2016-10-27 NOTE — Telephone Encounter (Signed)
Please make sure pt makes appt for today or tommorow for follow up cellulitis/abcess.

## 2016-10-27 NOTE — Telephone Encounter (Signed)
Spoken to Patient's daughter and schedule a re-check of the cyst with Dr Diona Browner since Anda Kraft did not have any 30 minutes appt on 10/28/16

## 2016-10-27 NOTE — Telephone Encounter (Signed)
[name] is scheduled to see Dr. Diona Browner 10/28/16 at 10:30 am.

## 2016-10-28 ENCOUNTER — Ambulatory Visit (INDEPENDENT_AMBULATORY_CARE_PROVIDER_SITE_OTHER): Payer: Medicare Other | Admitting: Family Medicine

## 2016-10-28 ENCOUNTER — Encounter: Payer: Self-pay | Admitting: Family Medicine

## 2016-10-28 DIAGNOSIS — B372 Candidiasis of skin and nail: Secondary | ICD-10-CM | POA: Insufficient documentation

## 2016-10-28 DIAGNOSIS — L02212 Cutaneous abscess of back [any part, except buttock]: Secondary | ICD-10-CM | POA: Insufficient documentation

## 2016-10-28 MED ORDER — DOXYCYCLINE HYCLATE 100 MG PO TABS
100.0000 mg | ORAL_TABLET | Freq: Two times a day (BID) | ORAL | 0 refills | Status: DC
Start: 2016-10-28 — End: 2016-11-10

## 2016-10-28 MED ORDER — NYSTATIN 100000 UNIT/GM EX CREA: 1.0000 "application " | TOPICAL_CREAM | Freq: Two times a day (BID) | CUTANEOUS | 0 refills | Status: DC

## 2016-10-28 NOTE — Patient Instructions (Addendum)
Apply nystatin cream under breasts.  Start doxycycline BID x 10 day.  Start warm compresses 2-3 times daily. Call if redness spreading or new fever.

## 2016-10-28 NOTE — Assessment & Plan Note (Signed)
Recurrence... Will broaden antibiotics. No culture sent.  Cover MRSA with doxy x 10 days.  Warm compresses.. If not improving by early next week will repeat I and D.

## 2016-10-28 NOTE — Assessment & Plan Note (Signed)
Complete nystatin course x 2 days after rash resolves.

## 2016-10-28 NOTE — Progress Notes (Signed)
Pre visit review using our clinic review tool, if applicable. No additional management support is needed unless otherwise documented below in the visit note. 

## 2016-10-28 NOTE — Progress Notes (Signed)
   Subjective:    Patient ID: Jill [name], female    DOB: 02-22-1932, 83 y.o.   MRN: [id number]  HPI  83 year old female presents for follow up cyst on back.  She saw Allie Bossier on 2/26.  Copied HPI from that visit follows: Skin Infection: She first noticed this in November/December 2017 as a cyst like growth. She presented to the dermatologist in December who couldn't give her an explanation or remove the cyst. Since then noticed gradual increase in size. Over the last 1 week she's noticed soreness, redness, and yellow pus. Treated with I and D and keflex x 10 days.  10/28/16 Today: Pt reports she completed antibiotics 2 days ago. She has noted improvement in redness , soreness and swelling.  She did have episode of pain and redness 2 days ago.Marland Kitchen Opened back up on its own and drained, now feels better. No fever.    Redness and itchy under both breasts. No odor?   Was treated with diflucan x 1 on 2/26 for  Candidal intertrigo. Review of Systems  Constitutional: Negative for fatigue and fever.  HENT: Negative for ear pain.   Eyes: Negative for pain.  Respiratory: Negative for chest tightness and shortness of breath.   Cardiovascular: Negative for chest pain, palpitations and leg swelling.  Gastrointestinal: Negative for abdominal pain.  Genitourinary: Negative for dysuria.       Objective:   Physical Exam  Constitutional: Vital signs are normal. She appears well-developed and well-nourished. She is cooperative.  Non-toxic appearance. She does not appear ill. No distress.  HENT:  Head: Normocephalic.  Right Ear: Hearing, tympanic membrane, external ear and ear canal normal. Tympanic membrane is not erythematous, not retracted and not bulging.  Left Ear: Hearing, tympanic membrane, external ear and ear canal normal. Tympanic membrane is not erythematous, not retracted and not bulging.  Nose: No mucosal edema or rhinorrhea. Right sinus exhibits no maxillary sinus tenderness and no  frontal sinus tenderness. Left sinus exhibits no maxillary sinus tenderness and no frontal sinus tenderness.  Mouth/Throat: Uvula is midline, oropharynx is clear and moist and mucous membranes are normal.  Eyes: Conjunctivae, EOM and lids are normal. Pupils are equal, round, and reactive to light. Lids are everted and swept, no foreign bodies found.  Neck: Trachea normal and normal range of motion. Neck supple. Carotid bruit is not present. No thyroid mass and no thyromegaly present.  Cardiovascular: Normal rate, regular rhythm, S1 normal, S2 normal, normal heart sounds, intact distal pulses and normal pulses.  Exam reveals no gallop and no friction rub.   No murmur heard. Pulmonary/Chest: Effort normal and breath sounds normal. No tachypnea. No respiratory distress. She has no decreased breath sounds. She has no wheezes. She has no rhonchi. She has no rales.  Abdominal: Soft. Normal appearance and bowel sounds are normal. There is no tenderness.  Neurological: She is alert.  Skin: Skin is warm, dry and intact. No rash noted.  Multiple SKs  1.5 cm diameter raised lesion in central back, mildly tender, minimal redness, no discharge, no central pore  Erythema under bilateral breasts  Psychiatric: Her speech is normal and behavior is normal. Judgment and thought content normal. Her mood appears not anxious. Cognition and memory are normal. She does not exhibit a depressed mood.          Assessment & Plan:

## 2016-11-01 ENCOUNTER — Ambulatory Visit: Payer: Medicare Other | Admitting: Family Medicine

## 2016-11-10 ENCOUNTER — Ambulatory Visit (INDEPENDENT_AMBULATORY_CARE_PROVIDER_SITE_OTHER): Payer: Medicare Other | Admitting: Family Medicine

## 2016-11-10 ENCOUNTER — Encounter: Payer: Self-pay | Admitting: Family Medicine

## 2016-11-10 VITALS — BP 147/68 | HR 79 | Temp 97.9°F | Ht 61.0 in | Wt 176.0 lb

## 2016-11-10 DIAGNOSIS — L02212 Cutaneous abscess of back [any part, except buttock]: Secondary | ICD-10-CM | POA: Diagnosis not present

## 2016-11-10 DIAGNOSIS — B372 Candidiasis of skin and nail: Secondary | ICD-10-CM | POA: Diagnosis not present

## 2016-11-10 DIAGNOSIS — E118 Type 2 diabetes mellitus with unspecified complications: Secondary | ICD-10-CM

## 2016-11-10 MED ORDER — NYSTATIN 100000 UNIT/GM EX POWD: Freq: Four times a day (QID) | CUTANEOUS | 3 refills | Status: DC

## 2016-11-10 NOTE — Progress Notes (Signed)
Pre visit review using our clinic review tool, if applicable. No additional management support is needed unless otherwise documented below in the visit note. 

## 2016-11-10 NOTE — Assessment & Plan Note (Signed)
Moderate control Work on healthy low carb diet. INCrease activity as able. Recheck A1C in 2 months.

## 2016-11-10 NOTE — Assessment & Plan Note (Signed)
Change to nystatin powder given moisture under breasts.

## 2016-11-10 NOTE — Progress Notes (Signed)
   Subjective:    Patient ID: Jill [name], female    DOB: November 26, 1931, 83 y.o.   MRN: [id number]  HPI  83 year old female pt presents with her daughter for follow up  recurrence of abcess on back. Initial treatment with keflex x 10 days. For recurrence on 10/28/2016...treated with doxy x 10 days and warm compresses.  No longer pain in area, no redness, no increase in warmth. No fever, no flu like symptoms.  She is feeling stronger overall. Went to grocery store by herself.   Now with erythema and redness under breasts  Nystatin cream not effective , but was on antibiotics.   Review of Systems  Constitutional: Negative for fatigue and fever.  HENT: Negative for ear pain.   Eyes: Negative for pain.  Respiratory: Negative for chest tightness and shortness of breath.   Cardiovascular: Negative for chest pain, palpitations and leg swelling.  Gastrointestinal: Negative for abdominal pain.  Genitourinary: Negative for dysuria.       Objective:   Physical Exam  Constitutional: Vital signs are normal. She appears well-developed and well-nourished. She is cooperative.  Non-toxic appearance. She does not appear ill. No distress.  HENT:  Head: Normocephalic.  Right Ear: Hearing, tympanic membrane, external ear and ear canal normal. Tympanic membrane is not erythematous, not retracted and not bulging.  Left Ear: Hearing, tympanic membrane, external ear and ear canal normal. Tympanic membrane is not erythematous, not retracted and not bulging.  Nose: No mucosal edema or rhinorrhea. Right sinus exhibits no maxillary sinus tenderness and no frontal sinus tenderness. Left sinus exhibits no maxillary sinus tenderness and no frontal sinus tenderness.  Mouth/Throat: Uvula is midline, oropharynx is clear and moist and mucous membranes are normal.  Eyes: Conjunctivae, EOM and lids are normal. Pupils are equal, round, and reactive to light. Lids are everted and swept, no foreign bodies found.  Neck:  Trachea normal and normal range of motion. Neck supple. Carotid bruit is not present. No thyroid mass and no thyromegaly present.  Cardiovascular: Normal rate, regular rhythm, S1 normal, S2 normal, normal heart sounds, intact distal pulses and normal pulses.  Exam reveals no gallop and no friction rub.   No murmur heard. Pulmonary/Chest: Effort normal and breath sounds normal. No tachypnea. No respiratory distress. She has no decreased breath sounds. She has no wheezes. She has no rhonchi. She has no rales.  Abdominal: Soft. Normal appearance and bowel sounds are normal. There is no tenderness.  Neurological: She is alert.  Skin: Skin is warm, dry and intact. No rash noted.  Slight erythema, no warmth over cyst in low back, multiple SKs.   erythema moist skin under bilateral breasts.  Psychiatric: Her speech is normal and behavior is normal. Judgment and thought content normal. Her mood appears not anxious. Cognition and memory are normal. She does not exhibit a depressed mood.          Assessment & Plan:

## 2016-11-10 NOTE — Assessment & Plan Note (Signed)
Resolved infection.. No history of frequent recurrence  So no  indication for removal of cyst.

## 2016-11-10 NOTE — Patient Instructions (Signed)
Work on decreasing bread, juice, bananas in diet.

## 2016-12-20 ENCOUNTER — Encounter: Payer: Self-pay | Admitting: Primary Care

## 2016-12-20 ENCOUNTER — Ambulatory Visit (INDEPENDENT_AMBULATORY_CARE_PROVIDER_SITE_OTHER): Payer: Medicare Other | Admitting: Primary Care

## 2016-12-20 VITALS — BP 124/72 | HR 70 | Temp 97.9°F | Ht 61.0 in | Wt 176.8 lb

## 2016-12-20 DIAGNOSIS — M7989 Other specified soft tissue disorders: Secondary | ICD-10-CM | POA: Diagnosis not present

## 2016-12-20 MED ORDER — CEPHALEXIN 500 MG PO CAPS: 500.0000 mg | ORAL_CAPSULE | Freq: Two times a day (BID) | ORAL | 0 refills | Status: DC

## 2016-12-20 NOTE — Progress Notes (Signed)
Subjective:    Patient ID: Jill [name], female    DOB: 06-Apr-1932, 83 y.o.   MRN: [id number]  HPI  Jill [name] is an 83 year old female with a history of Type 2 Diabetes, Neuropathy, CHF, CKD who presents today with a chief complaint of foot swelling. She also reports pain and erythema. Her symptoms are located to the right dorsal foot distal to the right great toe. Her symptoms have been present for the past 10 days.   Her symptoms have gradually progressed. She denies any insect bites, snake bites, fevers, chills, history of gout. She's not done anything at home for her symptoms.   Review of Systems  Constitutional: Negative for fever.  Musculoskeletal: Positive for arthralgias.  Skin: Positive for color change. Negative for wound.       Past Medical History:  Diagnosis Date  . Allergy   . Arthritis    osteoarthritis   . Chronic kidney disease   . Complication of anesthesia    difficult waking   . Depression   . Diabetes mellitus   . Diverticula, colon   . Family history of anesthesia complication    Son is difficult intubation  . Hyperlipidemia   . Hypertension   . Vasovagal syncope 2006   Negative cardiac workup-myoview, echo     Social History   Social History  . Marital status: Widowed    Spouse name: N/A  . Number of children: N/A  . Years of education: N/A   Occupational History  . Not on file.   Social History Main Topics  . Smoking status: Former Smoker    Packs/day: 1.00    Years: 45.00    Types: Cigarettes    Quit date: 06/16/1999  . Smokeless tobacco: Never Used     Comment: quit 15 years ago  . Alcohol use No  . Drug use: No  . Sexual activity: Not Currently   Other Topics Concern  . 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 Diet: healthy diet      Son Antony Haste [place]) [place] , has living will and DNR ( reviewed 2015)                Past Surgical History:  Procedure Laterality Date  . ABDOMINAL HYSTERECTOMY  1977   fibroid  . JOINT REPLACEMENT  2009   rt hip  . TOTAL HIP ARTHROPLASTY Left 05/12/2014   dr Mayer Camel  . TOTAL HIP ARTHROPLASTY Left 05/12/2014   Procedure: TOTAL HIP ARTHROPLASTY;  Surgeon: Kerin Salen, MD;  Location: [facility];  Service: Orthopedics;  Laterality: Left;    Family History  Problem Relation Age of Onset  . COPD Brother   . Heart disease Brother   . Diabetes Brother   . Lymphoma Brother   . Alzheimer's disease Brother   . Pancreatic cancer Sister   . Alzheimer's disease Sister   . Heart disease Mother   . Breast cancer Sister   . Leukemia Other   . Kidney disease Neg Hx     Allergies  Allergen Reactions  . Lovastatin Other (See Comments)    REACTION: leg pain  . Statins Other (See Comments)  REACTION: leg cramps, weakness  . Sulfa Antibiotics Hives and Itching  . Codeine Rash  . Niacin Rash    Flushing also    Current Outpatient Prescriptions on File Prior to Visit  Medication Sig Dispense Refill  . ALPRAZolam (XANAX) 0.25 MG tablet Take 1 tablet (0.25 mg total) by mouth 2 (two) times daily as needed for anxiety.    . diphenhydrAMINE (BENADRYL) 25 MG tablet Take 25 mg by mouth every 6 (six) hours as needed for itching.    . escitalopram (LEXAPRO) 20 MG tablet TAKE 1 TABLET BY MOUTH DAILY 30 tablet 5  . fenofibrate 54 MG tablet TAKE TWO (2) TABLETS BY MOUTH DAILY 180 tablet 3  . furosemide (LASIX) 20 MG tablet Take 20 mg by mouth daily as needed for fluid.     Marland Kitchen linezolid (ZYVOX) 600 MG tablet Take 1 tablet (600 mg total) by mouth 2 (two) times daily. 10 tablet 0  . tiotropium (SPIRIVA HANDIHALER) 18 MCG inhalation capsule Place 1 capsule (18 mcg total) into inhaler and inhale daily. 30 capsule 12  . nystatin  (MYCOSTATIN/NYSTOP) powder Apply topically 4 (four) times daily. (Patient not taking: Reported on 12/20/2016) 56.7 g 3  . promethazine (PHENERGAN) 25 MG tablet Take 1 tablet (25 mg total) by mouth every 6 (six) hours as needed for nausea or vomiting. (Patient not taking: Reported on 12/20/2016) 30 tablet 2  . Vitamin D, Ergocalciferol, (DRISDOL) 50000 units CAPS capsule Take 50,000 Units by mouth every 7 (seven) days.     No current facility-administered medications on file prior to visit.     BP 124/72   Pulse 70   Temp 97.9 F (36.6 C) (Oral)   Ht 5\' 1"  (1.549 m)   Wt 176 lb 12.8 oz (80.2 kg)   SpO2 97%   BMI 33.41 kg/m    Objective:   Physical Exam  Constitutional: She appears well-nourished.  Neck: Neck supple.  Cardiovascular: Normal rate and regular rhythm.   Pulses:      Dorsalis pedis pulses are 2+ on the right side, and 2+ on the left side.       Posterior tibial pulses are 2+ on the right side, and 2+ on the left side.  Pulmonary/Chest: Effort normal and breath sounds normal.  Skin: Skin is warm and dry.  Mild to moderate erythema to right dorsal foot distal to great toe. No open wound. Tender.          Assessment & Plan:  Foot Swelling:  Located to right foot as noted above. No open wound, injury or obvious reason for swelling. Good pulses. Symptoms and presentation are concerning for cellulits.  Start cephalexin course x 10 days. Also reasonable to check Uric acid given history of CKD coupled with presentation of joint swelling. Discussed home care instructions. She is stable for outpatient treatment. Follow up with PCP or myself Friday this week.  Sheral Flow, NP

## 2016-12-20 NOTE — Patient Instructions (Signed)
Start Cephalexin antibiotics. Take 1 capsule by mouth twice daily for 10 days.  Complete lab work prior to leaving today. I will notify you of your results once received.   Follow up with Dr. Diona Browner or myself on Friday this week.   It was a pleasure to see you today!

## 2016-12-20 NOTE — Progress Notes (Signed)
Pre visit review using our clinic review tool, if applicable. No additional management support is needed unless otherwise documented below in the visit note. 

## 2016-12-21 LAB — CBC WITH DIFFERENTIAL/PLATELET
BASOS PCT: 0.5 % (ref 0.0–3.0)
Basophils Absolute: 0 10*3/uL (ref 0.0–0.1)
EOS PCT: 4.3 % (ref 0.0–5.0)
Eosinophils Absolute: 0.3 10*3/uL (ref 0.0–0.7)
HCT: 35.7 % — ABNORMAL LOW (ref 36.0–46.0)
Hemoglobin: 11.9 g/dL — ABNORMAL LOW (ref 12.0–15.0)
LYMPHS ABS: 2.7 10*3/uL (ref 0.7–4.0)
Lymphocytes Relative: 35.6 % (ref 12.0–46.0)
MCHC: 33.2 g/dL (ref 30.0–36.0)
MCV: 90.6 fl (ref 78.0–100.0)
MONO ABS: 0.8 10*3/uL (ref 0.1–1.0)
Monocytes Relative: 11.1 % (ref 3.0–12.0)
NEUTROS PCT: 48.5 % (ref 43.0–77.0)
Neutro Abs: 3.7 10*3/uL (ref 1.4–7.7)
Platelets: 272 10*3/uL (ref 150.0–400.0)
RBC: 3.94 Mil/uL (ref 3.87–5.11)
RDW: 13.8 % (ref 11.5–15.5)
WBC: 7.6 10*3/uL (ref 4.0–10.5)

## 2016-12-21 LAB — URIC ACID: URIC ACID, SERUM: 7.3 mg/dL — AB (ref 2.4–7.0)

## 2016-12-23 ENCOUNTER — Encounter: Payer: Self-pay | Admitting: Family Medicine

## 2016-12-23 ENCOUNTER — Ambulatory Visit (INDEPENDENT_AMBULATORY_CARE_PROVIDER_SITE_OTHER): Payer: Medicare Other | Admitting: Family Medicine

## 2016-12-23 ENCOUNTER — Ambulatory Visit: Payer: Self-pay | Admitting: Family Medicine

## 2016-12-23 DIAGNOSIS — E118 Type 2 diabetes mellitus with unspecified complications: Secondary | ICD-10-CM | POA: Diagnosis not present

## 2016-12-23 DIAGNOSIS — M10371 Gout due to renal impairment, right ankle and foot: Secondary | ICD-10-CM

## 2016-12-23 DIAGNOSIS — M109 Gout, unspecified: Secondary | ICD-10-CM | POA: Insufficient documentation

## 2016-12-23 MED ORDER — PREDNISONE 20 MG PO TABS: ORAL_TABLET | ORAL | 0 refills | Status: DC

## 2016-12-23 NOTE — Assessment & Plan Note (Signed)
With increased uric acid, nml wbc and no improvement with antibiotics, gout most likely.  Possible reason for non-severe pain is decreased sensation in feet given longstanding DM.  given renal impairment.. Treat with  prednsione taper.  Work on low purine diet, if repeated flares consider prophylactic like uloric.

## 2016-12-23 NOTE — Assessment & Plan Note (Signed)
Follow  CBGs on prednsione.

## 2016-12-23 NOTE — Patient Instructions (Signed)
Stop Keflex. Start prednisone for gout. Follow blood sugar daily and call if > 200. Work on low purine diet.  Call if pain not improving as expected.  Low-Purine Diet Purines are compounds that affect the level of uric acid in your body. A low-purine diet is a diet that is low in purines. Eating a low-purine diet can prevent the level of uric acid in your body from getting too high and causing gout or kidney stones or both. What do I need to know about this diet?  Choose low-purine foods. Examples of low-purine foods are listed in the next section.  Drink plenty of fluids, especially water. Fluids can help remove uric acid from your body. Try to drink 8-16 cups (1.9-3.8 L) a day.  Limit foods high in fat, especially saturated fat, as fat makes it harder for the body to get rid of uric acid. Foods high in saturated fat include pizza, cheese, ice cream, whole milk, fried foods, and gravies. Choose foods that are lower in fat and lean sources of protein. Use olive oil when cooking as it contains healthy fats that are not high in saturated fat.  Limit alcohol. Alcohol interferes with the elimination of uric acid from your body. If you are having a gout attack, avoid all alcohol.  Keep in mind that different people's bodies react differently to different foods. You will probably learn over time which foods do or do not affect you. If you discover that a food tends to cause your gout to flare up, avoid eating that food. You can more freely enjoy foods that do not cause problems. If you have any questions about a food item, talk to your dietitian or health care provider. Which foods are low, moderate, and high in purines? The following is a list of foods that are low, moderate, and high in purines. You can eat any amount of the foods that are low in purines. You may be able to have small amounts of foods that are moderate in purines. Ask your health care provider how much of a food moderate in purines  you can have. Avoid foods high in purines. Grains   Foods low in purines: Enriched white bread, pasta, rice, cake, cornbread, popcorn.  Foods moderate in purines: Whole-grain breads and cereals, wheat germ, bran, oatmeal. Uncooked oatmeal. Dry wheat bran or wheat germ.  Foods high in purines: Pancakes, [place] toast, biscuits, muffins. Vegetables   Foods low in purines: All vegetables, except those that are moderate in purines.  Foods moderate in purines: Asparagus, cauliflower, spinach, mushrooms, green peas. Fruits   All fruits are low in purines. Meats and other Protein Foods   Foods low in purines: Eggs, nuts, peanut butter.  Foods moderate in purines: 80-90% lean beef, lamb, veal, pork, poultry, fish, eggs, peanut butter, nuts. Crab, lobster, oysters, and shrimp. Cooked dried beans, peas, and lentils.  Foods high in purines: Anchovies, sardines, herring, mussels, tuna, codfish, scallops, trout, and haddock. Jill [name]. Organ meats (such as liver or kidney). Tripe. Game meat. Goose. Sweetbreads. Dairy   All dairy foods are low in purines. Low-fat and fat-free dairy products are best because they are low in saturated fat. Beverages   Drinks low in purines: Water, carbonated beverages, tea, coffee, cocoa.  Drinks moderate in purines: Soft drinks and other drinks sweetened with high-fructose corn syrup. Juices. To find whether a food or drink is sweetened with high-fructose corn syrup, look at the ingredients list.  Drinks high in purines: Alcoholic beverages (such  as beer). Condiments   Foods low in purines: Salt, herbs, olives, pickles, relishes, vinegar.  Foods moderate in purines: Butter, margarine, oils, mayonnaise. Fats and Oils   Foods low in purines: All types, except gravies and sauces made with meat.  Foods high in purines: Gravies and sauces made with meat. Other Foods   Foods low in purines: Sugars, sweets, gelatin. Cake. Soups made without meat.  Foods moderate  in purines: Meat-based or fish-based soups, broths, or bouillons. Foods and drinks sweetened with high-fructose corn syrup.  Foods high in purines: High-fat desserts (such as ice cream, cookies, cakes, pies, doughnuts, and chocolate). Contact your dietitian for more information on foods that are not listed here.  This information is not intended to replace advice given to you by your health care provider. Make sure you discuss any questions you have with your health care provider. Document Released: 12/03/2010 Document Revised: 01/14/2016 Document Reviewed: 07/15/2013 Elsevier Interactive Patient Education  2017 [employer].

## 2016-12-23 NOTE — Progress Notes (Signed)
   Subjective:    Patient ID: Jill [name], female    DOB: 1932-05-21, 83 y.o.   MRN: [id number]  HPI    84 year old female diabetic presents for follow up right foot redness and swelling.  Seen on  5/1 by Allie Bossier.. Felt most consistent with cellulitis and started on antibiotics (Keflex BID x 10 days) . Not as consistent with gout.  Uric acid was increased at 7.3  wbc was normal.   Today she reports minimal improvement  In redness.  Pain continue when she moves it in great toe , MCP.  No fever.   GFR 32   DM, good control lately:  FBS 100-150   Review of Systems  Constitutional: Negative for fatigue and fever.  HENT: Negative for ear pain.   Eyes: Negative for pain.  Respiratory: Negative for chest tightness and shortness of breath.   Cardiovascular: Negative for chest pain, palpitations and leg swelling.  Gastrointestinal: Negative for abdominal pain.  Genitourinary: Negative for dysuria.       Objective:   Physical Exam  Constitutional: Vital signs are normal. She appears well-developed and well-nourished. She is cooperative.  Non-toxic appearance. She does not appear ill. No distress.  HENT:  Head: Normocephalic.  Right Ear: Hearing, tympanic membrane, external ear and ear canal normal. Tympanic membrane is not erythematous, not retracted and not bulging.  Left Ear: Hearing, tympanic membrane, external ear and ear canal normal. Tympanic membrane is not erythematous, not retracted and not bulging.  Nose: No mucosal edema or rhinorrhea. Right sinus exhibits no maxillary sinus tenderness and no frontal sinus tenderness. Left sinus exhibits no maxillary sinus tenderness and no frontal sinus tenderness.  Mouth/Throat: Uvula is midline, oropharynx is clear and moist and mucous membranes are normal.  Eyes: Conjunctivae, EOM and lids are normal. Pupils are equal, round, and reactive to light. Lids are everted and swept, no foreign bodies found.  Neck: Trachea normal and  normal range of motion. Neck supple. Carotid bruit is not present. No thyroid mass and no thyromegaly present.  Cardiovascular: Normal rate, regular rhythm, S1 normal, S2 normal, normal heart sounds, intact distal pulses and normal pulses.  Exam reveals no gallop and no friction rub.   No murmur heard. Pulmonary/Chest: Effort normal and breath sounds normal. No tachypnea. No respiratory distress. She has no decreased breath sounds. She has no wheezes. She has no rhonchi. She has no rales.  Abdominal: Soft. Normal appearance and bowel sounds are normal. There is no tenderness.  Musculoskeletal:  Pain in right 1st MCP joint with palpation and movement, mild to moderate  Neurological: She is alert.  Skin: Skin is warm, dry and intact. No rash noted.  Erythema, purpilish discoloration and increase in warmth at base of right great toe.  Psychiatric: Her speech is normal and behavior is normal. Judgment and thought content normal. Her mood appears not anxious. Cognition and memory are normal. She does not exhibit a depressed mood.          Assessment & Plan:

## 2016-12-23 NOTE — Progress Notes (Signed)
Pre visit review using our clinic review tool, if applicable. No additional management support is needed unless otherwise documented below in the visit note. 

## 2017-01-01 ENCOUNTER — Encounter: Payer: Self-pay | Admitting: Family Medicine

## 2017-01-02 ENCOUNTER — Encounter: Payer: Self-pay | Admitting: Family Medicine

## 2017-01-02 ENCOUNTER — Ambulatory Visit (INDEPENDENT_AMBULATORY_CARE_PROVIDER_SITE_OTHER): Payer: Medicare Other | Admitting: Family Medicine

## 2017-01-02 DIAGNOSIS — M10371 Gout due to renal impairment, right ankle and foot: Secondary | ICD-10-CM | POA: Diagnosis not present

## 2017-01-02 MED ORDER — PREDNISONE 10 MG PO TABS: ORAL_TABLET | ORAL | 0 refills | Status: DC

## 2017-01-02 NOTE — Assessment & Plan Note (Signed)
Repeat taper of prednisone but slower.  Doubt stress fracture but if not recurring.. Consider X-ray.

## 2017-01-02 NOTE — Patient Instructions (Signed)
Repeat slower taper of prednisone.  Call if pain and swelling recurs.  Follow sugar on  prednisone, call if > 200.  Work on low purine diet.

## 2017-01-02 NOTE — Progress Notes (Signed)
   Subjective:    Patient ID: Jill [name], female    DOB: 1932-08-08, 83 y.o.   MRN: [id number]  HPI    83 year old diabetic female presents with recurrence of left foot swelling and pain.  She has upcoming 8 hour car ride to go out of town on a trip.  At last OV: With increased uric acid, nml wbc and no improvement with antibiotics, gout most likely.  Possible reason for non-severe pain is decreased sensation in feet given longstanding DM.  given renal impairment.. Treat with  prednsione taper.  Work on low purine diet, if repeated flares consider prophylactic like uloric.  Today she reports her pain and swelling improved dramatically with prednisone, but once off it swelling recurred after 2 days. Pain with bending toe. Mild pain with weight bearing.  FBS 110 even on prednisone.   Review of Systems  Constitutional: Negative for fatigue and fever.  HENT: Negative for ear pain.   Eyes: Negative for pain.  Respiratory: Negative for chest tightness and shortness of breath.   Cardiovascular: Negative for chest pain, palpitations and leg swelling.  Gastrointestinal: Negative for abdominal pain.  Genitourinary: Negative for dysuria. With increased uric acid, nml wbc and no improvement with antibiotics, gout most likely.  Possible reason for non-severe pain is decreased sensation in feet given longstanding DM.  given renal impairment.. Treat with  prednsione taper.  Work on low purine diet, if repeated flares consider prophylactic like uloric.  Today she reports her pain and swelling improved dramatically with prednisone, but once off it swelling recurred after 2 days. Pain with bending toe. Mild pain with weight bearing.  FBS 110 even on prednisone.   Review of Systems  Constitutional: Negative for fatigue and fever.  HENT: Negative for ear pain.   Eyes: Negative for pain.  Respiratory: Negative for chest tightness and shortness of breath.   Cardiovascular: Negative for chest pain, palpitations and leg swelling.  Gastrointestinal: Negative for abdominal pain.  Genitourinary: Negative for dysuria.       Objective:   Physical Exam  Constitutional: Vital signs are normal. She appears well-developed and well-nourished. She is cooperative.  Non-toxic appearance. She does not appear ill. No distress.  HENT:  Head: Normocephalic.  Right Ear: Hearing, tympanic membrane, external ear and ear canal normal. Tympanic membrane is not erythematous, not retracted and not bulging.  Left Ear: Hearing, tympanic membrane, external ear and ear canal normal. Tympanic membrane is not erythematous, not retracted and not bulging.  Nose: No mucosal edema or rhinorrhea. Right sinus exhibits no maxillary sinus tenderness and no frontal sinus tenderness. Left sinus exhibits no maxillary sinus tenderness and no frontal sinus tenderness.  Mouth/Throat: Uvula is midline,  oropharynx is clear and moist and mucous membranes are normal.  Eyes: Conjunctivae, EOM and lids are normal. Pupils are equal, round, and reactive to light. Lids are everted and swept, no foreign bodies found.  Neck: Trachea normal and normal range of motion. Neck supple. Carotid bruit is not present. No thyroid mass and no thyromegaly present.  Cardiovascular: Normal rate, regular rhythm, S1 normal, S2 normal, normal heart sounds, intact distal pulses and normal pulses.  Exam reveals no gallop and no friction rub.   No murmur heard. Pulmonary/Chest: Effort normal and breath sounds normal. No tachypnea. No respiratory distress. She has no decreased breath sounds. She has no wheezes. She has no rhonchi. She has no rales.  Abdominal: Soft. Normal appearance and bowel sounds are normal. There is no tenderness.  Musculoskeletal:  Pain in right 1st MCP joint with palpation and movement, mild to moderate  Neurological: She is alert.  Skin: Skin is warm, dry and intact. No rash noted.  Erythema, purpilish discoloration and increase in warmth at base of right great toe.  Psychiatric: Her speech is normal and behavior is normal. Judgment and thought content normal. Her mood appears not anxious. Cognition and memory are normal. She does not exhibit a depressed mood.          Assessment & Plan:

## 2017-01-09 ENCOUNTER — Telehealth: Payer: Self-pay | Admitting: Family Medicine

## 2017-01-09 DIAGNOSIS — E118 Type 2 diabetes mellitus with unspecified complications: Secondary | ICD-10-CM

## 2017-01-09 NOTE — Telephone Encounter (Signed)
-----   Message from Ellamae Sia sent at 01/03/2017 11:48 AM EDT ----- Regarding: Lab orders for Wednesday, 5.23.18 Lab orders for a 2 month f/u

## 2017-01-11 ENCOUNTER — Other Ambulatory Visit (INDEPENDENT_AMBULATORY_CARE_PROVIDER_SITE_OTHER): Payer: Medicare Other

## 2017-01-11 DIAGNOSIS — E118 Type 2 diabetes mellitus with unspecified complications: Secondary | ICD-10-CM | POA: Diagnosis not present

## 2017-01-11 LAB — COMPREHENSIVE METABOLIC PANEL
ALT: 11 U/L (ref 0–35)
AST: 14 U/L (ref 0–37)
Albumin: 3.6 g/dL (ref 3.5–5.2)
Alkaline Phosphatase: 56 U/L (ref 39–117)
BILIRUBIN TOTAL: 0.4 mg/dL (ref 0.2–1.2)
BUN: 35 mg/dL — ABNORMAL HIGH (ref 6–23)
CALCIUM: 9.6 mg/dL (ref 8.4–10.5)
CHLORIDE: 105 meq/L (ref 96–112)
CO2: 29 meq/L (ref 19–32)
Creatinine, Ser: 1.6 mg/dL — ABNORMAL HIGH (ref 0.40–1.20)
GFR: 32.72 mL/min — AB (ref >60.00)
GLUCOSE: 204 mg/dL — AB (ref 70–99)
POTASSIUM: 5.1 meq/L (ref 3.5–5.1)
Sodium: 141 mEq/L (ref 135–145)
Total Protein: 6.3 g/dL (ref 6.0–8.3)

## 2017-01-11 LAB — HEMOGLOBIN A1C: HEMOGLOBIN A1C: 7.6 % — AB (ref 4.6–6.5)

## 2017-01-13 ENCOUNTER — Ambulatory Visit (INDEPENDENT_AMBULATORY_CARE_PROVIDER_SITE_OTHER): Payer: Medicare Other | Admitting: Family Medicine

## 2017-01-13 ENCOUNTER — Encounter: Payer: Self-pay | Admitting: Family Medicine

## 2017-01-13 VITALS — BP 126/62 | HR 95 | Temp 98.4°F | Ht 61.0 in | Wt 176.2 lb

## 2017-01-13 DIAGNOSIS — M10371 Gout due to renal impairment, right ankle and foot: Secondary | ICD-10-CM

## 2017-01-13 DIAGNOSIS — L820 Inflamed seborrheic keratosis: Secondary | ICD-10-CM | POA: Insufficient documentation

## 2017-01-13 DIAGNOSIS — E0843 Diabetes mellitus due to underlying condition with diabetic autonomic (poly)neuropathy: Secondary | ICD-10-CM | POA: Diagnosis not present

## 2017-01-13 LAB — HM DIABETES FOOT EXAM

## 2017-01-13 NOTE — Progress Notes (Signed)
   Subjective:    Patient ID: Jill [name], female    DOB: 1932-05-21, 83 y.o.   MRN: [id number]  HPI  83 year old female presents for follow u[p DM.Marland Kitchen  She has recently been on 2 courses of prednisone for gout.   Diabetes:   Tolerable control at her age on no medication. Lab Results  Component Value Date   HGBA1C 7.6 (H) 01/11/2017  Using medications without difficulties: Hypoglycemic episodes:none Hyperglycemic episodes:  none Feet problems: see below, no ulcers Blood Sugars averaging: FBS 105 blood sugar was 204 at lab check though. eye exam within last year: 01/28/2016  Gout, flare:  Pain in foot is much better.   minimal redness and swelling.   She has two skin lesions on right cheek.. Irritated by glasses,  infalmmaed,no bleeding. Present for last year.  Review of Systems  Constitutional: Positive for fatigue.  HENT: Negative for ear pain.   Eyes: Negative for pain.  Respiratory: Negative for shortness of breath and wheezing.   Cardiovascular: Negative for chest pain, palpitations and leg swelling.  Gastrointestinal: Negative for abdominal pain.     Blood pressure 126/62, pulse 95, temperature 98.4 F (36.9 C), temperature source Oral, height 5\' 1"  (1.549 m), weight 176 lb 4 oz (79.9 kg).  Objective:   Physical Exam  Constitutional: Vital signs are normal. She appears well-developed and well-nourished. She is cooperative.  Non-toxic appearance. She does not appear ill. No distress.  HENT:  Head: Normocephalic.  Right Ear: Hearing, tympanic membrane, external ear and ear canal normal. Tympanic membrane is not erythematous, not retracted and not bulging.  Left Ear: Hearing, tympanic membrane, external ear and ear canal normal. Tympanic membrane is not erythematous, not retracted and not bulging.  Nose: No mucosal edema or rhinorrhea. Right sinus exhibits no maxillary sinus tenderness and no frontal sinus tenderness. Left sinus exhibits no maxillary sinus tenderness  and no frontal sinus tenderness.  Mouth/Throat: Uvula is midline, oropharynx is clear and moist and mucous membranes are normal.  Eyes: Conjunctivae, EOM and lids are normal. Pupils are equal, round, and reactive to light. Lids are everted and swept, no foreign bodies found.  Neck: Trachea normal and normal range of motion. Neck supple. Carotid bruit is not present. No thyroid mass and no thyromegaly present.  Cardiovascular: Regular rhythm, S1 normal, S2 normal, normal heart sounds, intact distal pulses and normal pulses.  Tachycardia present.  Exam reveals no gallop and no friction rub.   No murmur heard.  Mild tach.. Likely due to pred  Pulmonary/Chest: Effort normal and breath sounds normal. No tachypnea. No respiratory distress. She has no decreased breath sounds. She has no wheezes. She has no rhonchi. She has no rales.  Abdominal: Soft. Normal appearance and bowel sounds are normal. There is no tenderness.  Neurological: She is alert.  Skin: Skin is warm, dry and intact. No rash noted.  Two inflamed SK on right  cheek  Psychiatric: Her speech is normal and behavior is normal. Judgment and thought content normal. Her mood appears not anxious. Cognition and memory are normal. She does not exhibit a depressed mood.          Assessment & Plan:

## 2017-01-13 NOTE — Assessment & Plan Note (Signed)
Procedure note: cryotherapy with liquid nitrogen performed.. 3 cycles with limited halo performed, no complications.  Area covered with bandaid.

## 2017-01-13 NOTE — Assessment & Plan Note (Signed)
Improved with pred taper. Low uric acid diet.

## 2017-01-13 NOTE — Patient Instructions (Addendum)
Work on low carbohydrate diet.  Work on conditioning exercises.  Keep area where skin lesion was clean with soap and water covered with bandaid. Can apply topical antibiotic ointment.  Expect redness, blister then scab.

## 2017-01-13 NOTE — Assessment & Plan Note (Signed)
Tolerable control per A1C off med. Recent elevations given prednisone. Encouraged exercise, weight maintanance, healthy eating habits.

## 2017-01-20 ENCOUNTER — Other Ambulatory Visit: Payer: Self-pay | Admitting: Family Medicine

## 2017-02-02 ENCOUNTER — Encounter: Payer: Self-pay | Admitting: Family Medicine

## 2017-02-02 ENCOUNTER — Ambulatory Visit (INDEPENDENT_AMBULATORY_CARE_PROVIDER_SITE_OTHER)
Admission: RE | Admit: 2017-02-02 | Discharge: 2017-02-02 | Disposition: A | Discharge status: Home / Self Care | Payer: Medicare Other | Source: Ambulatory Visit | Attending: Family Medicine | Admitting: Family Medicine

## 2017-02-02 ENCOUNTER — Ambulatory Visit
Admission: RE | Admit: 2017-02-02 | Discharge: 2017-02-02 | Disposition: A | Discharge status: Home / Self Care | Payer: Medicare Other | Source: Ambulatory Visit | Attending: Family Medicine | Admitting: Family Medicine

## 2017-02-02 ENCOUNTER — Ambulatory Visit (INDEPENDENT_AMBULATORY_CARE_PROVIDER_SITE_OTHER): Payer: Medicare Other | Admitting: Family Medicine

## 2017-02-02 VITALS — BP 122/59 | HR 92 | Temp 98.1°F | Ht 61.0 in | Wt 176.0 lb

## 2017-02-02 DIAGNOSIS — M79601 Pain in right arm: Secondary | ICD-10-CM

## 2017-02-02 DIAGNOSIS — S41111A Laceration without foreign body of right upper arm, initial encounter: Secondary | ICD-10-CM | POA: Diagnosis not present

## 2017-02-02 DIAGNOSIS — R531 Weakness: Secondary | ICD-10-CM

## 2017-02-02 DIAGNOSIS — R3915 Urgency of urination: Secondary | ICD-10-CM | POA: Diagnosis not present

## 2017-02-02 DIAGNOSIS — M791 Myalgia: Secondary | ICD-10-CM | POA: Diagnosis not present

## 2017-02-02 DIAGNOSIS — W19XXXA Unspecified fall, initial encounter: Secondary | ICD-10-CM | POA: Diagnosis not present

## 2017-02-02 DIAGNOSIS — M7918 Myalgia, other site: Secondary | ICD-10-CM | POA: Insufficient documentation

## 2017-02-02 DIAGNOSIS — S41119A Laceration without foreign body of unspecified upper arm, initial encounter: Secondary | ICD-10-CM | POA: Insufficient documentation

## 2017-02-02 LAB — POC URINALSYSI DIPSTICK (AUTOMATED)
BILIRUBIN UA: NEGATIVE
Blood, UA: NEGATIVE
GLUCOSE UA: NEGATIVE
KETONES UA: NEGATIVE
Nitrite, UA: NEGATIVE
Protein, UA: NEGATIVE
SPEC GRAV UA: 1.015 (ref 1.010–1.025)
UROBILINOGEN UA: 0.2 U/dL
pH, UA: 6.5 (ref 5.0–8.0)

## 2017-02-02 MED ORDER — CEPHALEXIN 500 MG PO CAPS: 500.0000 mg | ORAL_CAPSULE | Freq: Three times a day (TID) | ORAL | 0 refills | Status: DC

## 2017-02-02 NOTE — Progress Notes (Signed)
Subjective:    Patient ID: Jill [name], female    DOB: [birth year], 83 y.o.   MRN: [id number]  HPI  83 year old female with history of CKD, anemia, past CA PNA, past renal abscess,  DM, HTN, past intracranial bleed, depression and mild COPD presents with  recent fall.  weakness. She has also noted cough and urinary urgency.  She presents with her daughter and granddaughter today.    She reports  1 week ago noted increase in incontinence and increased urgency.  She is still weak... And has been weak for years.  She is having cough in last  24 hours.. Dry cough. She has felt more shortness of breath last night as well.  No fever.   She got tangled up with her feet on Saturday, tripped. NO proceeding symptoms, no dizziness, no chest pain, no weakness at that time..  Landed on floor, scraped skin off arm on right forearm. Sore over forearm, no wrist or elbow pain. She has been putting vaseline and bandage on wound, slight increase in redness, has bruise as well.   No LOC but did hit head on left posterior head.. No bruise.  She has low back and buttock pain.. Since fall sore.. Improved now.   She is now using walker.  Blood pressure (!) 122/59, pulse 92, temperature 98.1 F (36.7 C), temperature source Oral, height 5\' 1"  (1.549 m), weight 176 lb (79.8 kg).   Review of Systems  Constitutional: Negative for fatigue and fever.  HENT: Negative for congestion.   Eyes: Negative for pain.  Respiratory: Negative for cough and shortness of breath.   Cardiovascular: Negative for chest pain, palpitations and leg swelling.  Gastrointestinal: Negative for abdominal pain.  Genitourinary: Positive for pelvic pain. Negative for dysuria and vaginal bleeding.  Musculoskeletal: Positive for arthralgias and gait problem. Negative for back pain.  Neurological: Negative for syncope, light-headedness and headaches.  Psychiatric/Behavioral: Negative for dysphoric mood.       Objective:   Physical  Exam  Constitutional: Vital signs are normal. She appears well-developed and well-nourished. She is cooperative.  Non-toxic appearance. She does not appear ill. No distress.  Elderly female in NAD  HENT:  Head: Normocephalic.  Right Ear: Hearing, tympanic membrane, external ear and ear canal normal. Tympanic membrane is not erythematous, not retracted and not bulging.  Left Ear: Hearing, tympanic membrane, external ear and ear canal normal. Tympanic membrane is not erythematous, not retracted and not bulging.  Nose: No mucosal edema or rhinorrhea. Right sinus exhibits no maxillary sinus tenderness and no frontal sinus tenderness. Left sinus exhibits no maxillary sinus tenderness and no frontal sinus tenderness.  Mouth/Throat: Uvula is midline, oropharynx is clear and moist and mucous membranes are normal.  Eyes: Conjunctivae, EOM and lids are normal. Pupils are equal, round, and reactive to light. Lids are everted and swept, no foreign bodies found.  Neck: Trachea normal and normal range of motion. Neck supple. Carotid bruit is not present. No thyroid mass and no thyromegaly present.  Cardiovascular: Normal rate, regular rhythm, S1 normal, S2 normal, normal heart sounds, intact distal pulses and normal pulses.  Exam reveals no gallop and no friction rub.   No murmur heard. Pulmonary/Chest: Effort normal and breath sounds normal. No tachypnea. No respiratory distress. She has no decreased breath sounds. She has no wheezes. She has no rhonchi. She has no rales.  Abdominal: Soft. Normal appearance and bowel sounds are normal. There is no tenderness.  Musculoskeletal:  Lumbar back: She exhibits normal range of motion and no tenderness.   No anterior hip pain but tender in bilateral buttocks and posterior pelvis  Neurological: She is alert. She displays a negative Romberg sign. Coordination and gait abnormal.  Skin: Skin is warm, dry and intact. No rash noted.  Psychiatric: Her speech is normal  and behavior is normal. Judgment and thought content normal. Her mood appears not anxious. Cognition and memory are normal. She does not exhibit a depressed mood.          Assessment & Plan:

## 2017-02-02 NOTE — Patient Instructions (Signed)
We will call with X-ray results.  

## 2017-02-03 DIAGNOSIS — R3915 Urgency of urination: Secondary | ICD-10-CM | POA: Insufficient documentation

## 2017-02-03 NOTE — Assessment & Plan Note (Signed)
Given age and fall with eval with x-rays.

## 2017-02-03 NOTE — Assessment & Plan Note (Signed)
Given age and fall will eval for fracture of pelvis.

## 2017-02-03 NOTE — Assessment & Plan Note (Signed)
Given pt history of recurrent UTI.. Will have pt return  With UA as unable to obtain in office today.

## 2017-02-03 NOTE — Assessment & Plan Note (Signed)
Likely multifactorial.. more chronic than acute change.

## 2017-02-03 NOTE — Assessment & Plan Note (Signed)
Slight surrounding warmth concerning for cellultitis. Will eval UA and if not posisitive will still treat with antibitoics to cover for cellultis.

## 2017-02-04 LAB — URINE CULTURE

## 2017-02-06 MED ORDER — CIPROFLOXACIN HCL 250 MG PO TABS: 250.0000 mg | ORAL_TABLET | Freq: Two times a day (BID) | ORAL | 0 refills | Status: DC

## 2017-02-06 NOTE — Addendum Note (Signed)
Addended by: Carter Kitten on: 02/06/2017 10:39 AM   Modules accepted: Orders

## 2017-02-24 ENCOUNTER — Other Ambulatory Visit: Payer: Self-pay | Admitting: Family Medicine

## 2017-02-24 NOTE — Telephone Encounter (Signed)
Last office visit 02/02/2017.  Last Vit D level was low at 28.30 ng/ml on 06/30/2016.  Refill?

## 2017-03-17 ENCOUNTER — Telehealth: Payer: Self-pay | Admitting: Family Medicine

## 2017-03-17 NOTE — Telephone Encounter (Signed)
Jill [name] pt daughter called she thinks pt has UTI and wanted to know if she could come by and get a cup and bring the sample back to be tested

## 2017-03-17 NOTE — Telephone Encounter (Signed)
Ideally given pt's complicated history.. She needs appt to be seen. Can double book my 2:45 appt. If she cannot come in at all... Needs to get all symptoms and have daughter bring in clean catch.

## 2017-04-17 IMAGING — US US RENAL
1 series · 14 of 25 positions shown · non-contrast
Comparison: 02/16/2015, 02/05/2015

CLINICAL DATA: Right perinephric fluid collection

EXAM:
RENAL / URINARY TRACT ULTRASOUND COMPLETE

[Series 1: us renal · 0.28mm/px · 14 of 53 slices shown]
[im 1/53]
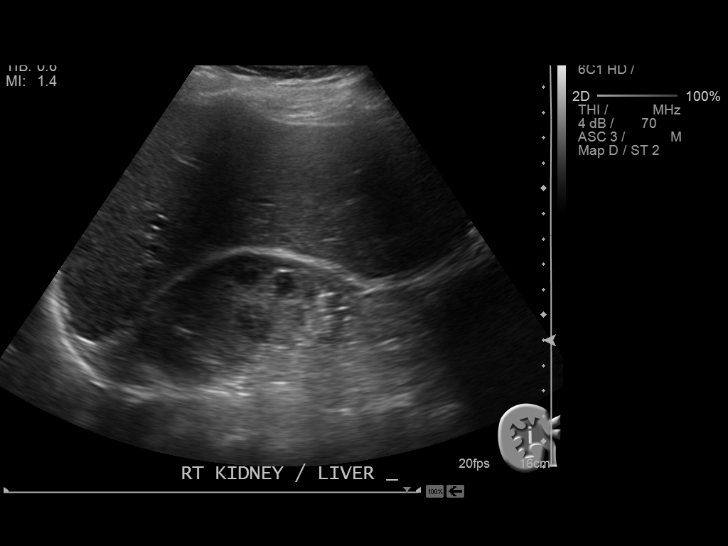
[im 5/53]
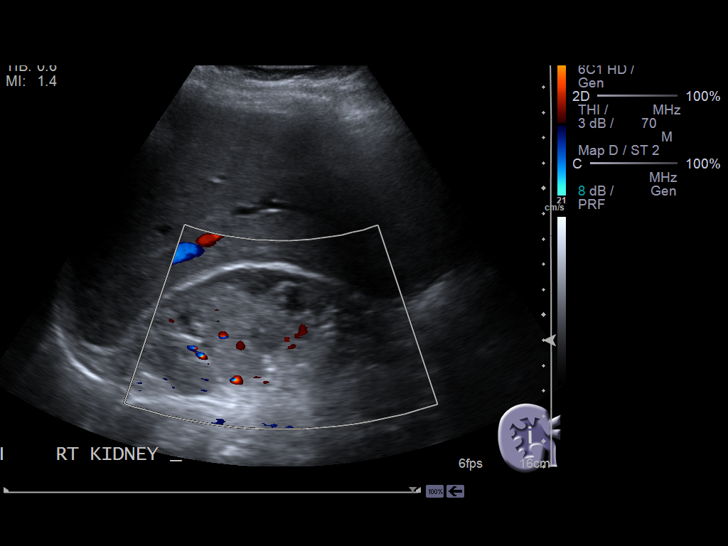
[im 9/53]
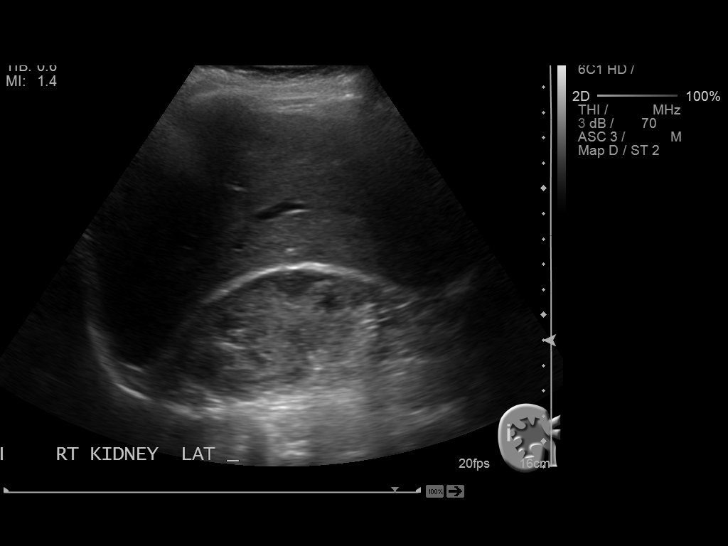
[im 14/53]
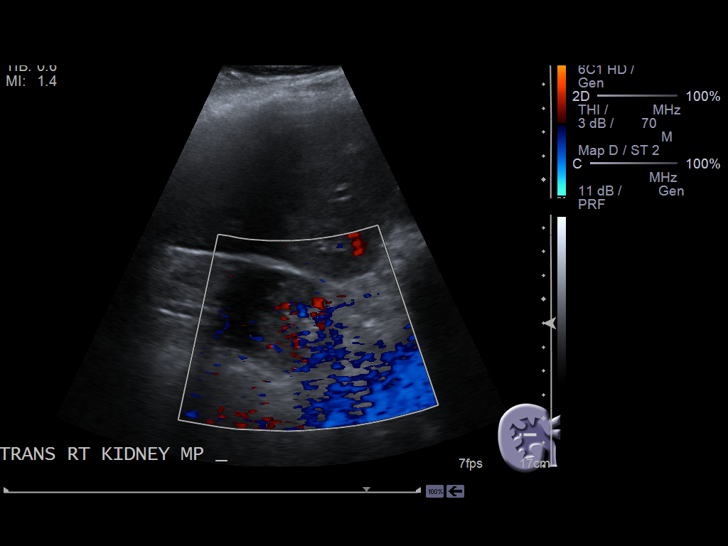
[im 18/53]
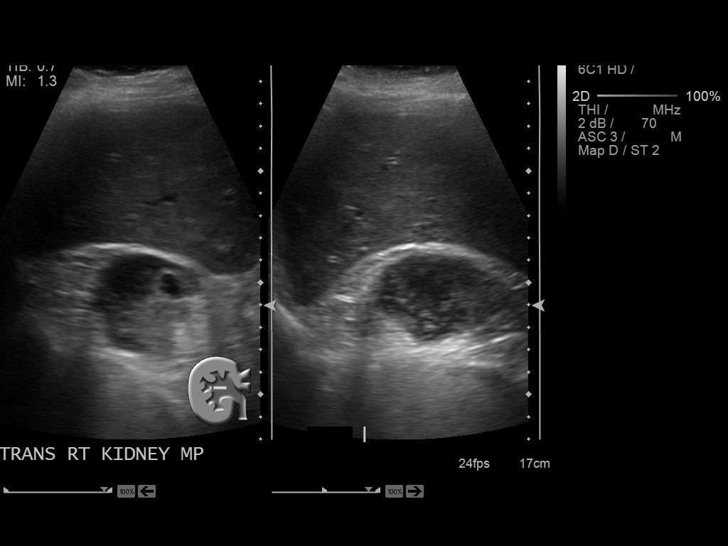
[im 20/53]
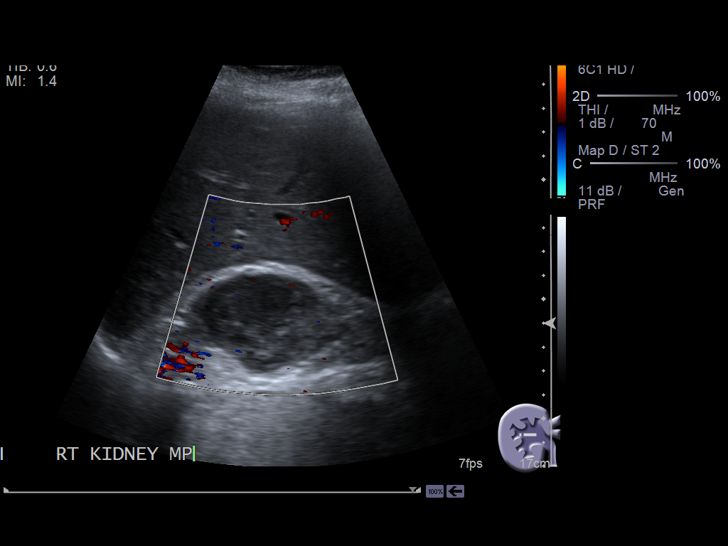
[im 24/53]
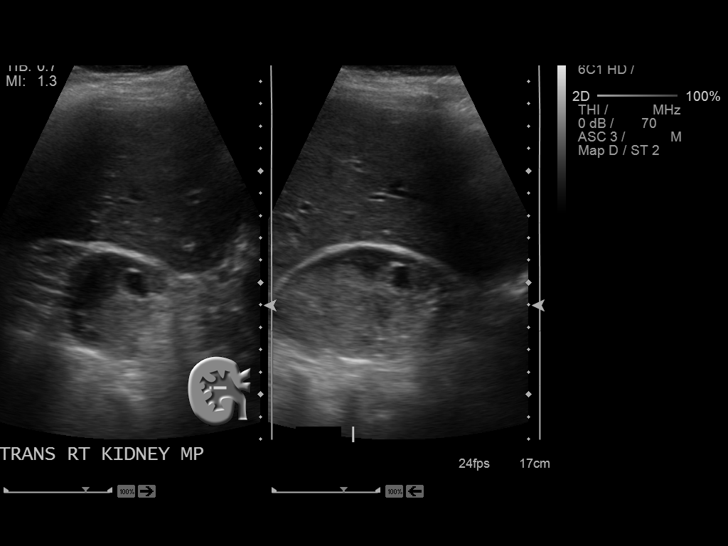
[im 29/53]
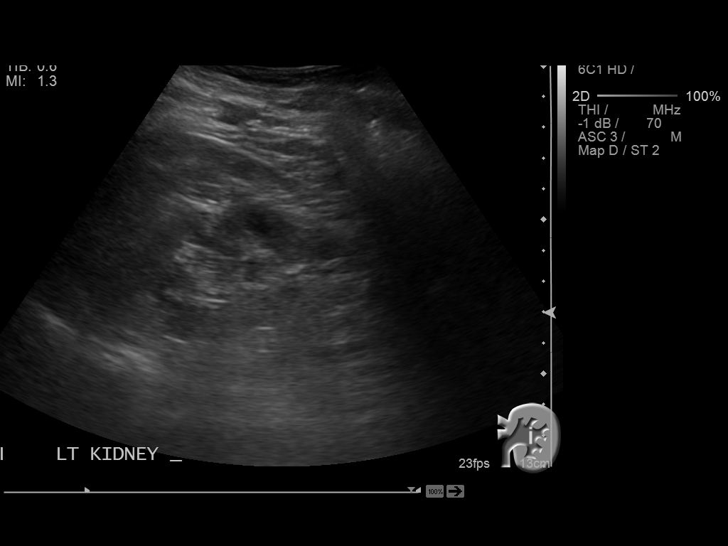
[im 33/53]
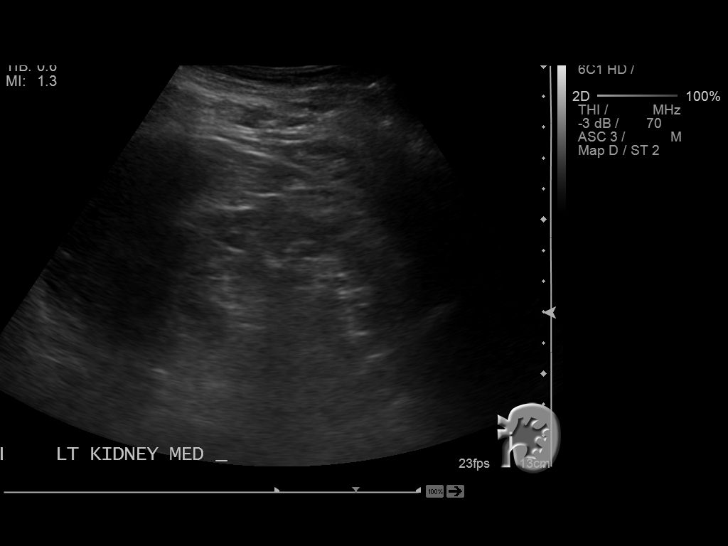
[im 35/53]
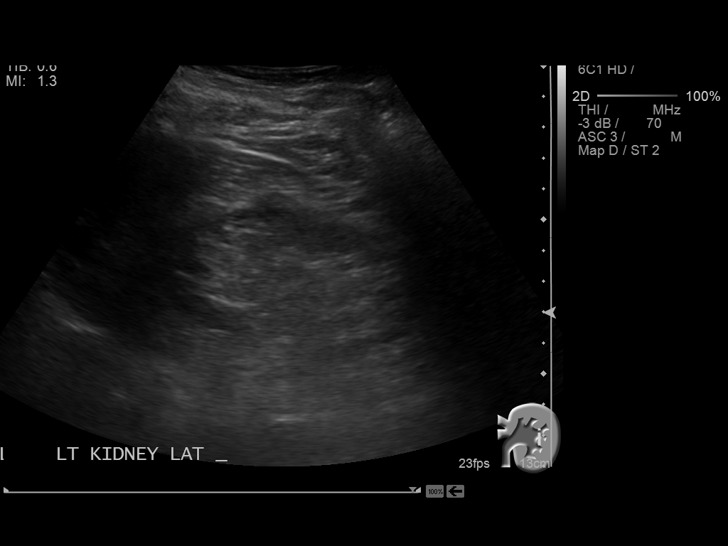
[im 40/53]
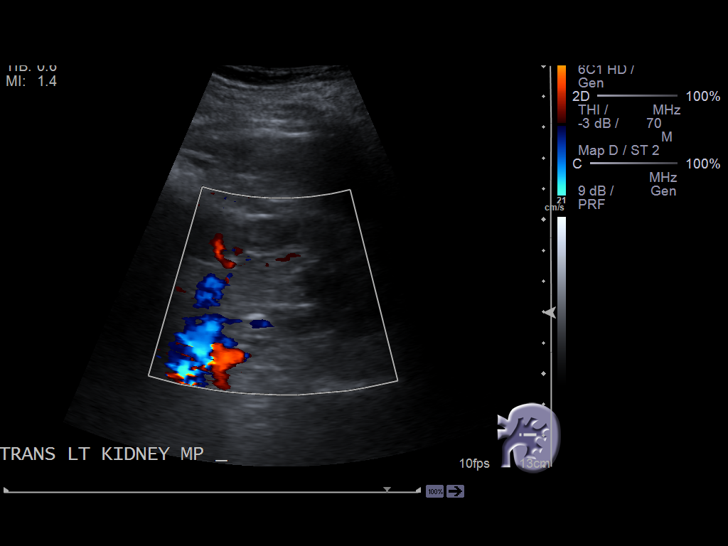
[im 44/53]
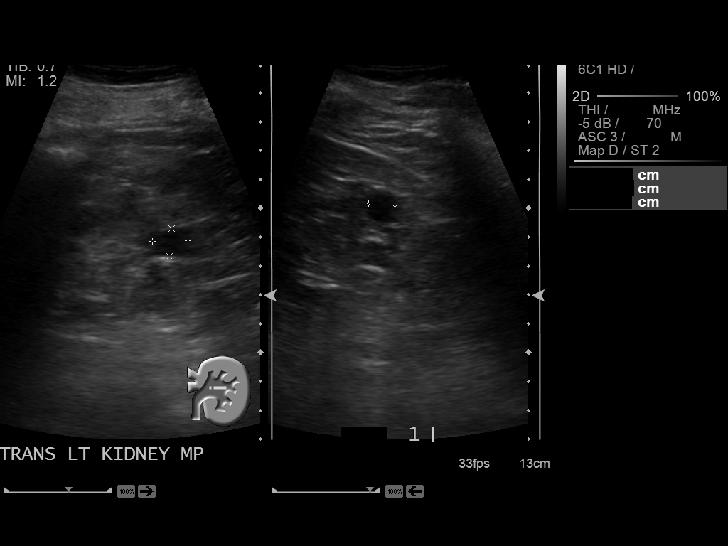
[im 48/53]
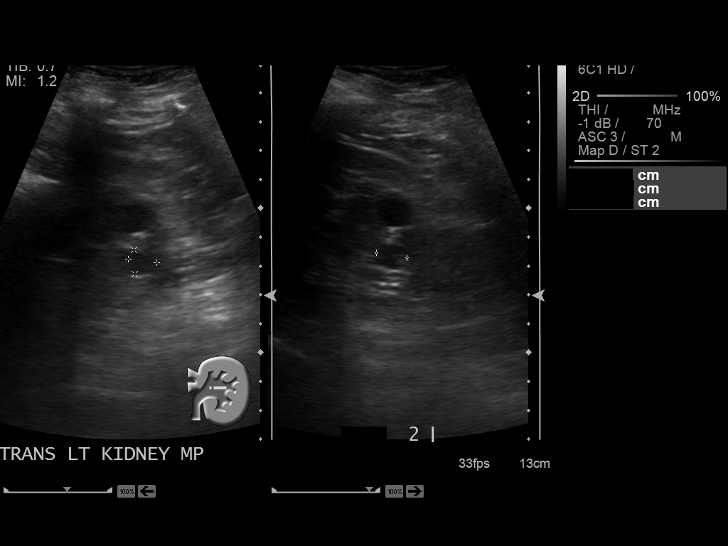
[im 53/53]
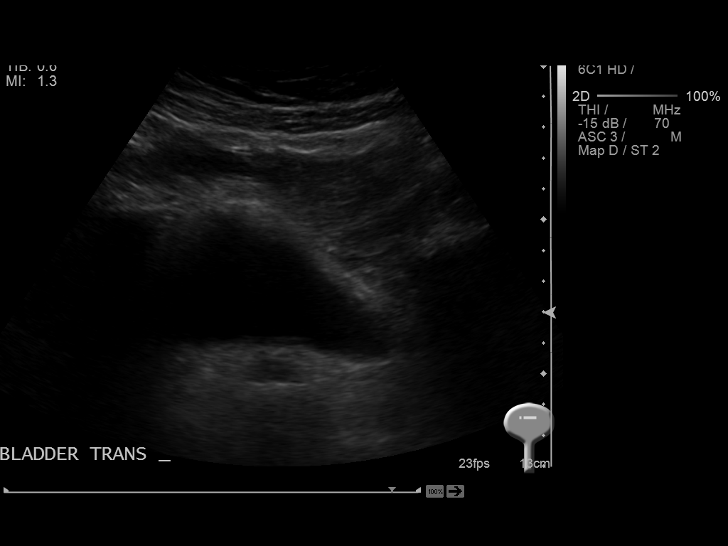

[14 of 25 positions shown; findings below may reference images not displayed]

FINDINGS: Right Kidney:

Length: 9.5 cm.. A curvilinear hypoechoic Structure is noted along
the margin of the right kidney. 6.0 x 4.0 cm. It measures
approximately 2.3 cm in thickness. This is an interval decrease in
appearance when compared with the prior exam. A few small cysts are
noted within the right kidney. The largest of these measures 1 cm.

Left Kidney:

Length: 2.6 cm.. Few small cysts are noted. Some mild cortical
thinning is seen.

Bladder:

Decompressed partially.
IMPRESSION: Decreasing right perinephric fluid collection consistent with a
resolving hematoma.

Bilateral renal cysts.

## 2017-05-14 ENCOUNTER — Encounter: Payer: Self-pay | Admitting: Urology

## 2017-05-16 ENCOUNTER — Ambulatory Visit: Payer: Medicare Other | Admitting: Urology

## 2017-05-18 ENCOUNTER — Ambulatory Visit: Payer: Medicare Other | Admitting: Urology

## 2017-05-20 ENCOUNTER — Other Ambulatory Visit: Payer: Self-pay | Admitting: Family Medicine

## 2017-05-22 ENCOUNTER — Ambulatory Visit: Payer: Medicare Other | Admitting: Urology

## 2017-06-02 ENCOUNTER — Other Ambulatory Visit: Payer: Self-pay | Admitting: Family Medicine

## 2017-06-05 NOTE — Telephone Encounter (Signed)
Last office visit 02/02/2017.  Last refilled ?07/05/2016.  Last Vitamin D level 06/30/2016 which was low at 28.30 ng/ml.  Refill?

## 2017-07-03 ENCOUNTER — Ambulatory Visit (INDEPENDENT_AMBULATORY_CARE_PROVIDER_SITE_OTHER): Payer: Medicare Other

## 2017-07-03 VITALS — BP 124/80 | HR 99 | Temp 98.1°F | Ht 62.5 in | Wt 175.2 lb

## 2017-07-03 DIAGNOSIS — Z0000 Encounter for general adult medical examination without abnormal findings: Secondary | ICD-10-CM | POA: Diagnosis not present

## 2017-07-03 DIAGNOSIS — Z23 Encounter for immunization: Secondary | ICD-10-CM | POA: Diagnosis not present

## 2017-07-03 DIAGNOSIS — E785 Hyperlipidemia, unspecified: Secondary | ICD-10-CM

## 2017-07-03 DIAGNOSIS — E118 Type 2 diabetes mellitus with unspecified complications: Secondary | ICD-10-CM | POA: Diagnosis not present

## 2017-07-03 DIAGNOSIS — R748 Abnormal levels of other serum enzymes: Secondary | ICD-10-CM | POA: Diagnosis not present

## 2017-07-03 DIAGNOSIS — R7309 Other abnormal glucose: Secondary | ICD-10-CM | POA: Diagnosis not present

## 2017-07-03 DIAGNOSIS — E559 Vitamin D deficiency, unspecified: Secondary | ICD-10-CM | POA: Diagnosis not present

## 2017-07-03 DIAGNOSIS — I10 Essential (primary) hypertension: Secondary | ICD-10-CM | POA: Diagnosis not present

## 2017-07-03 LAB — LIPID PANEL
CHOL/HDL RATIO: 4
CHOLESTEROL: 207 mg/dL — AB (ref 0–200)
HDL: 57.7 mg/dL (ref >39.00)
LDL CALC: 119 mg/dL — AB (ref 0–99)
NonHDL: 148.83
Triglycerides: 147 mg/dL (ref 0.0–149.0)
VLDL: 29.4 mg/dL (ref 0.0–40.0)

## 2017-07-03 LAB — COMPREHENSIVE METABOLIC PANEL
ALT: 10 U/L (ref 0–35)
AST: 17 U/L (ref 0–37)
Albumin: 3.7 g/dL (ref 3.5–5.2)
Alkaline Phosphatase: 42 U/L (ref 39–117)
BUN: 32 mg/dL — AB (ref 6–23)
CHLORIDE: 100 meq/L (ref 96–112)
CO2: 31 mEq/L (ref 19–32)
Calcium: 10.5 mg/dL (ref 8.4–10.5)
Creatinine, Ser: 1.88 mg/dL — ABNORMAL HIGH (ref 0.40–1.20)
GFR: 27.14 mL/min — ABNORMAL LOW (ref >60.00)
GLUCOSE: 138 mg/dL — AB (ref 70–99)
POTASSIUM: 4.6 meq/L (ref 3.5–5.1)
SODIUM: 138 meq/L (ref 135–145)
TOTAL PROTEIN: 6.7 g/dL (ref 6.0–8.3)
Total Bilirubin: 0.4 mg/dL (ref 0.2–1.2)

## 2017-07-03 LAB — VITAMIN D 25 HYDROXY (VIT D DEFICIENCY, FRACTURES): VITD: 84.4 ng/mL (ref 30.00–100.00)

## 2017-07-03 LAB — VITAMIN B12: Vitamin B-12: 548 pg/mL (ref 211–911)

## 2017-07-03 LAB — TSH: TSH: 3.48 u[IU]/mL (ref 0.35–4.50)

## 2017-07-03 LAB — LDL CHOLESTEROL, DIRECT: LDL DIRECT: 122 mg/dL

## 2017-07-03 LAB — MICROALBUMIN / CREATININE URINE RATIO
CREATININE, U: 121.2 mg/dL
Microalb Creat Ratio: 3.4 mg/g (ref 0.0–30.0)
Microalb, Ur: 4.1 mg/dL — ABNORMAL HIGH (ref 0.0–1.9)

## 2017-07-03 NOTE — Progress Notes (Signed)
PCP notes:   Health maintenance:  Eye exam - addressed Microalbumin - completed Flu vaccine - administered  Abnormal screenings:   Fall risk - hx of multiple falls with bruising only  Hearing -failed  Hearing Screening   125Hz  250Hz  500Hz  1000Hz  2000Hz  3000Hz  4000Hz  6000Hz  8000Hz   Right ear:   40 0 0  0    Left ear:   40 0 0  0     Patient concerns:   None  Nurse concerns:  None  Next PCP appt:   07/18/17 @ 1400

## 2017-07-03 NOTE — Patient Instructions (Addendum)
Ms. Gebhart , Thank you for taking time to come for your Medicare Wellness Visit. I appreciate your ongoing commitment to your health goals. Please review the following plan we discussed and let me know if I can assist you in the future.   These are the goals we discussed: Goals    Starting 07/03/2017, I will attempt to drink 8oz water with each meal.       This is a list of the screening recommended for you and due dates:  Health Maintenance  Topic Date Due  . Eye exam for diabetics  07/03/2018*  . Hemoglobin A1C  07/14/2017  . Complete foot exam   01/13/2018  . Urine Protein Check  07/03/2018  . Tetanus Vaccine  02/21/2024  . Flu Shot  Completed  . DEXA scan (bone density measurement)  Completed  . Pneumonia vaccines  Completed  *Topic was postponed. The date shown is not the original due date.   Preventive Care for Adults  A healthy lifestyle and preventive care can promote health and wellness. Preventive health guidelines for adults include the following key practices.  . A routine yearly physical is a good way to check with your health care provider about your health and preventive screening. It is a chance to share any concerns and updates on your health and to receive a thorough exam.  . Visit your dentist for a routine exam and preventive care every 6 months. Brush your teeth twice a day and floss once a day. Good oral hygiene prevents tooth decay and gum disease.  . The frequency of eye exams is based on your age, health, family medical history, use  of contact lenses, and other factors. Follow your health care provider's recommendations for frequency of eye exams.  . Eat a healthy diet. Foods like vegetables, fruits, whole grains, low-fat dairy products, and lean protein foods contain the nutrients you need without too many calories. Decrease your intake of foods high in solid fats, added sugars, and salt. Eat the right amount of calories for you. Get information about a  proper diet from your health care provider, if necessary.  . Regular physical exercise is one of the most important things you can do for your health. Most adults should get at least 150 minutes of moderate-intensity exercise (any activity that increases your heart rate and causes you to sweat) each week. In addition, most adults need muscle-strengthening exercises on 2 or more days a week.  Silver Sneakers may be a benefit available to you. To determine eligibility, you may visit the website: www.silversneakers.com or contact program at 469-148-7028 Mon-Fri between 8AM-8PM.   . Maintain a healthy weight. The body mass index (BMI) is a screening tool to identify possible weight problems. It provides an estimate of body fat based on height and weight. Your health care provider can find your BMI and can help you achieve or maintain a healthy weight.   For adults 20 years and older: ? A BMI below 18.5 is considered underweight. ? A BMI of 18.5 to 24.9 is normal. ? A BMI of 25 to 29.9 is considered overweight. ? A BMI of 30 and above is considered obese.   . Maintain normal blood lipids and cholesterol levels by exercising and minimizing your intake of saturated fat. Eat a balanced diet with plenty of fruit and vegetables. Blood tests for lipids and cholesterol should begin at age 32 and be repeated every 5 years. If your lipid or cholesterol levels are high, you  are over 35, or you are at high risk for heart disease, you may need your cholesterol levels checked more frequently. Ongoing high lipid and cholesterol levels should be treated with medicines if diet and exercise are not working.  . If you smoke, find out from your health care provider how to quit. If you do not use tobacco, please do not start.  . If you choose to drink alcohol, please do not consume more than 2 drinks per day. One drink is considered to be 12 ounces (355 mL) of beer, 5 ounces (148 mL) of wine, or 1.5 ounces (44 mL) of  liquor.  . If you are 23-27 years old, ask your health care provider if you should take aspirin to prevent strokes.  . Use sunscreen. Apply sunscreen liberally and repeatedly throughout the day. You should seek shade when your shadow is shorter than you. Protect yourself by wearing long sleeves, pants, a wide-brimmed hat, and sunglasses year round, whenever you are outdoors.  . Once a month, do a whole body skin exam, using a mirror to look at the skin on your back. Tell your health care provider of new moles, moles that have irregular borders, moles that are larger than a pencil eraser, or moles that have changed in shape or color.

## 2017-07-03 NOTE — Progress Notes (Signed)
Pre visit review using our clinic review tool, if applicable. No additional management support is needed unless otherwise documented below in the visit note. 

## 2017-07-03 NOTE — Progress Notes (Signed)
Subjective:   Jill [name] is a 83 y.o. female who presents for Medicare Annual (Subsequent) preventive examination.  Review of Systems:  N/A Cardiac Risk Factors include: advanced age (>34men, >56 women);obesity (BMI >30kg/m2);dyslipidemia;hypertension     Objective:     Vitals: BP 124/80 (BP Location: Right Arm, Patient Position: Sitting, Cuff Size: Normal)   Pulse 99   Temp 98.1 F (36.7 C) (Oral)   Ht 5' 2.5" (1.588 m) Comment: no shoes  Wt 175 lb 4 oz (79.5 kg)   SpO2 94%   BMI 31.54 kg/m   Body mass index is 31.54 kg/m.   Tobacco Social History   Tobacco Use  Smoking Status Former Smoker  . Packs/day: 1.00  . Years: 45.00  . Pack years: 45.00  . Types: Cigarettes  . Last attempt to quit: 06/16/1999  . Years since quitting: 18.0  Smokeless Tobacco Never Used  Tobacco Comment   quit 15 years ago     Counseling given: No Comment: quit 15 years ago   Past Medical History:  Diagnosis Date  . Allergy   . Arthritis    osteoarthritis   . Chronic kidney disease   . Complication of anesthesia    difficult waking   . Depression   . Diabetes mellitus   . Diverticula, colon   . Family history of anesthesia complication    Son is difficult intubation  . Hyperlipidemia   . Hypertension   . Vasovagal syncope 2006   Negative cardiac workup-myoview, echo   Past Surgical History:  Procedure Laterality Date  . ABDOMINAL HYSTERECTOMY  1977   fibroid  . JOINT REPLACEMENT  2009   rt hip  . TOTAL HIP ARTHROPLASTY Left 05/12/2014   dr Mayer Camel   Family History  Problem Relation Age of Onset  . COPD Brother   . Heart disease Brother   . Diabetes Brother   . Lymphoma Brother   . Alzheimer's disease Brother   . Pancreatic cancer Sister   . Alzheimer's disease Sister   . Heart disease Mother   . Breast cancer Sister   . Leukemia Other   . Kidney disease Neg Hx    Social History   Substance and Sexual Activity  Sexual Activity Not Currently     Outpatient Encounter Medications as of 07/03/2017  Medication Sig  . ALPRAZolam (XANAX) 0.25 MG tablet Take 1 tablet (0.25 mg total) by mouth 2 (two) times daily as needed for anxiety.  . Cholecalciferol (VITAMIN D3) 50000 units CAPS TAKE ONE CAPSULE BY MOUTH ONCE WEEKLY  . diphenhydrAMINE (BENADRYL) 25 MG tablet Take 25 mg by mouth every 6 (six) hours as needed for itching.  . escitalopram (LEXAPRO) 20 MG tablet TAKE 1 TABLET BY MOUTH DAILY  . fenofibrate 54 MG tablet TAKE TWO (2) TABLETS BY MOUTH DAILY  . furosemide (LASIX) 20 MG tablet Take 20 mg by mouth daily as needed for fluid.   Marland Kitchen nystatin (MYCOSTATIN/NYSTOP) powder Apply topically 4 (four) times daily.  Marland Kitchen tiotropium (SPIRIVA HANDIHALER) 18 MCG inhalation capsule Place 1 capsule (18 mcg total) into inhaler and inhale daily.  . Vitamin D, Ergocalciferol, (DRISDOL) 50000 units CAPS capsule Take 50,000 Units by mouth every 7 (seven) days.  . [DISCONTINUED] cephALEXin (KEFLEX) 500 MG capsule Take 1 capsule (500 mg total) by mouth 3 (three) times daily.  . [DISCONTINUED] ciprofloxacin (CIPRO) 250 MG tablet Take 1 tablet (250 mg total) by mouth 2 (two) times daily.  . [DISCONTINUED] predniSONE (DELTASONE) 10 MG tablet  3 tabs po daily  5 days, then 2 tabs daily  5 days then 1 tab po dailyx 5 days then 1/2 tab daily.   No facility-administered encounter medications on file as of 07/03/2017.     Activities of Daily Living In your present state of health, do you have any difficulty performing the following activities: 07/03/2017 08/08/2016  Hearing? N N  Vision? N N  Difficulty concentrating or making decisions? Y N  Walking or climbing stairs? Y Y  Dressing or bathing? N N  Doing errands, shopping? N N  Preparing Food and eating ? N -  Using the Toilet? N -  In the past six months, have you accidently leaked urine? N -  Do you have problems with loss of bowel control? N -  Managing your Medications? N -  Managing your Finances? N  -  Housekeeping or managing your Housekeeping? N -  Some recent data might be hidden    Patient Care Team: Jinny Sanders, MD as PCP - General (Family Medicine)    Assessment:     Hearing Screening   125Hz  250Hz  500Hz  1000Hz  2000Hz  3000Hz  4000Hz  6000Hz  8000Hz   Right ear:   40 0 0  0    Left ear:   40 0 0  0      Visual Acuity Screening   Right eye Left eye Both eyes  Without correction:     With correction: 20/25 20/20-1 20/25  Comments: Last vision exam in Sept 2017; future appt scheduled in Winter 2018   Exercise Activities and Dietary recommendations Current Exercise Habits: The patient does not participate in regular exercise at present, Exercise limited by: orthopedic condition(s)  Goals    Starting 07/03/2017, I will attempt to drink 8oz water with each meal.      Fall Risk Fall Risk  07/03/2017 06/30/2016 06/02/2015 02/20/2014  Falls in the past year? Yes Yes No Yes  Comment 2-3 falls in past year due to feet "getting tangled up" - - -  Number falls in past yr: 2 or more 1 - 2 or more  Injury with Fall? Yes Yes - -  Risk Factor Category  - - - High Fall Risk  Risk for fall due to : - Impaired balance/gait - History of fall(s)  Follow up - Falls evaluation completed - -   Depression Screen PHQ 2/9 Scores 07/03/2017 06/30/2016 08/21/2015 06/02/2015  PHQ - 2 Score 0 0 2 0  PHQ- 9 Score 0 - 8 -     Cognitive Function MMSE - Mini Mental State Exam 07/03/2017 06/30/2016  Orientation to time 5 5  Orientation to Place 5 5  Registration 3 3  Attention/ Calculation 0 0  Recall 3 3  Language- name 2 objects 0 0  Language- repeat 1 1  Language- follow 3 step command 3 3  Language- read & follow direction 0 0  Write a sentence 0 0  Copy design 0 0  Total score 20 20       PLEASE NOTE: A Mini-Cog screen was completed. Maximum score is 20. A value of 0 denotes this part of Folstein MMSE was not completed or the patient failed this part of the Mini-Cog screening.    Mini-Cog Screening Orientation to Time - Max 5 pts Orientation to Place - Max 5 pts Registration - Max 3 pts Recall - Max 3 pts Language Repeat - Max 1 pts Language Follow 3 Step Command - Max 3 pts   Immunization  History  Administered Date(s) Administered  . H1N1 08/18/2008  . Influenza Whole 06/16/2007  . Influenza, High Dose Seasonal PF 07/03/2017  . Influenza,inj,Quad PF,6+ Mos 05/14/2014, 05/05/2015, 06/24/2016  . Influenza-Unspecified 06/22/2013  . Pneumococcal Conjugate-13 02/20/2014  . Pneumococcal Polysaccharide-23 01/20/1997, 06/24/2016  . Td 02/20/2003  . Tdap 02/20/2014   Screening Tests Health Maintenance  Topic Date Due  . OPHTHALMOLOGY EXAM  07/03/2018 (Originally 01/27/2017)  . HEMOGLOBIN A1C  07/14/2017  . FOOT EXAM  01/13/2018  . URINE MICROALBUMIN  07/03/2018  . TETANUS/TDAP  02/21/2024  . INFLUENZA VACCINE  Completed  . DEXA SCAN  Completed  . PNA vac Low Risk Adult  Completed      Plan:     I have personally reviewed, addressed, and noted the following in the patient's chart:  A. Medical and social history B. Use of alcohol, tobacco or illicit drugs  C. Current medications and supplements D. Functional ability and status E.  Nutritional status F.  Physical activity G. Advance directives H. List of other physicians I.  Hospitalizations, surgeries, and ER visits in previous 12 months J.  [place] to include hearing, vision, cognitive, depression L. Referrals and appointments - none  In addition, I have reviewed and discussed with patient certain preventive protocols, quality metrics, and best practice recommendations. A written personalized care plan for preventive services as well as general preventive health recommendations were provided to patient.  See attached scanned questionnaire for additional information.   Signed,   Lindell Noe, MHA, BS, LPN Health Coach

## 2017-07-04 NOTE — Progress Notes (Signed)
I reviewed health advisor's note, was available for consultation, and agree with documentation and plan.   Signed,  Deziah Renwick T. Favour Aleshire, MD  

## 2017-07-11 ENCOUNTER — Encounter: Payer: Medicare Other | Admitting: Family Medicine

## 2017-07-15 ENCOUNTER — Encounter (HOSPITAL_COMMUNITY): Payer: Self-pay

## 2017-07-15 ENCOUNTER — Emergency Department (HOSPITAL_COMMUNITY): Payer: Medicare Other

## 2017-07-15 ENCOUNTER — Emergency Department (HOSPITAL_COMMUNITY)
Admission: EM | Admit: 2017-07-15 | Discharge: 2017-07-15 | Disposition: A | Discharge status: Home / Self Care | Payer: Medicare Other | Attending: Emergency Medicine | Admitting: Emergency Medicine

## 2017-07-15 DIAGNOSIS — E1122 Type 2 diabetes mellitus with diabetic chronic kidney disease: Secondary | ICD-10-CM | POA: Insufficient documentation

## 2017-07-15 DIAGNOSIS — R079 Chest pain, unspecified: Secondary | ICD-10-CM | POA: Insufficient documentation

## 2017-07-15 DIAGNOSIS — I130 Hypertensive heart and chronic kidney disease with heart failure and stage 1 through stage 4 chronic kidney disease, or unspecified chronic kidney disease: Secondary | ICD-10-CM | POA: Diagnosis not present

## 2017-07-15 DIAGNOSIS — Z79899 Other long term (current) drug therapy: Secondary | ICD-10-CM | POA: Insufficient documentation

## 2017-07-15 DIAGNOSIS — R070 Pain in throat: Secondary | ICD-10-CM | POA: Insufficient documentation

## 2017-07-15 DIAGNOSIS — R0981 Nasal congestion: Secondary | ICD-10-CM | POA: Insufficient documentation

## 2017-07-15 DIAGNOSIS — Z87891 Personal history of nicotine dependence: Secondary | ICD-10-CM | POA: Diagnosis not present

## 2017-07-15 DIAGNOSIS — N184 Chronic kidney disease, stage 4 (severe): Secondary | ICD-10-CM | POA: Insufficient documentation

## 2017-07-15 DIAGNOSIS — Z96642 Presence of left artificial hip joint: Secondary | ICD-10-CM | POA: Insufficient documentation

## 2017-07-15 DIAGNOSIS — I5032 Chronic diastolic (congestive) heart failure: Secondary | ICD-10-CM | POA: Diagnosis not present

## 2017-07-15 DIAGNOSIS — R0600 Dyspnea, unspecified: Secondary | ICD-10-CM | POA: Diagnosis not present

## 2017-07-15 DIAGNOSIS — J40 Bronchitis, not specified as acute or chronic: Secondary | ICD-10-CM | POA: Diagnosis not present

## 2017-07-15 DIAGNOSIS — R05 Cough: Secondary | ICD-10-CM | POA: Diagnosis present

## 2017-07-15 DIAGNOSIS — J449 Chronic obstructive pulmonary disease, unspecified: Secondary | ICD-10-CM | POA: Diagnosis not present

## 2017-07-15 LAB — CBC
HEMATOCRIT: 36.5 % (ref 36.0–46.0)
HEMOGLOBIN: 12.1 g/dL (ref 12.0–15.0)
MCH: 30.6 pg (ref 26.0–34.0)
MCHC: 33.2 g/dL (ref 30.0–36.0)
MCV: 92.2 fL (ref 78.0–100.0)
Platelets: 291 10*3/uL (ref 150–400)
RBC: 3.96 MIL/uL (ref 3.87–5.11)
RDW: 13.7 % (ref 11.5–15.5)
WBC: 9.4 10*3/uL (ref 4.0–10.5)

## 2017-07-15 LAB — BASIC METABOLIC PANEL
ANION GAP: 11 (ref 5–15)
BUN: 31 mg/dL — ABNORMAL HIGH (ref 6–20)
CALCIUM: 9.4 mg/dL (ref 8.9–10.3)
CHLORIDE: 105 mmol/L (ref 101–111)
CO2: 23 mmol/L (ref 22–32)
Creatinine, Ser: 1.9 mg/dL — ABNORMAL HIGH (ref 0.44–1.00)
GFR calc non Af Amer: 23 mL/min — ABNORMAL LOW (ref >60)
GFR, EST AFRICAN AMERICAN: 27 mL/min — AB (ref >60)
Glucose, Bld: 125 mg/dL — ABNORMAL HIGH (ref 65–99)
POTASSIUM: 3.9 mmol/L (ref 3.5–5.1)
Sodium: 139 mmol/L (ref 135–145)

## 2017-07-15 LAB — I-STAT TROPONIN, ED: TROPONIN I, POC: 0.04 ng/mL (ref 0.00–0.08)

## 2017-07-15 MED ORDER — PREDNISONE 20 MG PO TABS
60.0000 mg | ORAL_TABLET | Freq: Once | ORAL | Status: AC
  Administered 2017-07-15: 60 mg via ORAL
  Filled 2017-07-15: qty 3

## 2017-07-15 MED ORDER — ALBUTEROL SULFATE HFA 108 (90 BASE) MCG/ACT IN AERS: 2.0000 | INHALATION_SPRAY | RESPIRATORY_TRACT | 0 refills | Status: DC | PRN

## 2017-07-15 MED ORDER — AZITHROMYCIN 250 MG PO TABS: ORAL_TABLET | ORAL | 0 refills | Status: DC

## 2017-07-15 MED ORDER — ALBUTEROL SULFATE (2.5 MG/3ML) 0.083% IN NEBU
5.0000 mg | INHALATION_SOLUTION | Freq: Once | RESPIRATORY_TRACT | Status: AC
  Administered 2017-07-15: 5 mg via RESPIRATORY_TRACT
  Filled 2017-07-15: qty 6

## 2017-07-15 MED ORDER — IPRATROPIUM BROMIDE 0.02 % IN SOLN
0.5000 mg | Freq: Once | RESPIRATORY_TRACT | Status: AC
  Administered 2017-07-15: 0.5 mg via RESPIRATORY_TRACT
  Filled 2017-07-15: qty 2.5

## 2017-07-15 MED ORDER — AZITHROMYCIN 250 MG PO TABS
500.0000 mg | ORAL_TABLET | Freq: Once | ORAL | Status: AC
  Administered 2017-07-15: 500 mg via ORAL
  Filled 2017-07-15: qty 2

## 2017-07-15 MED ORDER — PREDNISONE 50 MG PO TABS: ORAL_TABLET | ORAL | 0 refills | Status: DC

## 2017-07-15 NOTE — ED Notes (Signed)
ED Provider at bedside. 

## 2017-07-15 NOTE — ED Notes (Signed)
Patient transported to X-ray 

## 2017-07-15 NOTE — ED Provider Notes (Signed)
[facility] EMERGENCY DEPARTMENT Provider Note   CSN: [id number] Arrival date & time: 07/15/17  1723     History   Chief Complaint Chief Complaint  Patient presents with  . Cough  . Chest Pain    HPI Jill [name] is a 83 y.o. female.  83 year old female who presents with one-week history of productive cough with sharp intermittent chest discomfort which lasts for seconds.  No associated dyspnea or diaphoresis with it.  Has had some dyspnea on exertion with some wheezing.  No vomiting or diarrhea.  Has had nasal congestion as well as a scratchy throat.  Denies any urinary symptoms.  Did have a flu shot 2 weeks ago.  Denies any CHF or anginal symptoms.  No treatment used prior to arrival      Past Medical History:  Diagnosis Date  . Allergy   . Arthritis    osteoarthritis   . Chronic kidney disease   . Complication of anesthesia    difficult waking   . Depression   . Diabetes mellitus   . Diverticula, colon   . Family history of anesthesia complication    Son is difficult intubation  . Hyperlipidemia   . Hypertension   . Vasovagal syncope 2006   Negative cardiac workup-myoview, echo    Patient Active Problem List   Diagnosis Date Noted  . Urinary urgency 02/03/2017  . Right arm pain 02/02/2017  . Bilateral buttock pain 02/02/2017  . Laceration of arm 02/02/2017  . Seborrheic keratoses, inflamed 01/13/2017  . Gout involving toe of right foot 12/23/2016  . Candidal intertrigo 10/28/2016  . Cutaneous abscess of back (any part, except buttock) 10/28/2016  . Stage 1 mild COPD by GOLD classification ([place]) 09/13/2016  . CAP (community acquired pneumonia) 09/02/2016  . Intraparenchymal hematoma of brain ([place]) 08/08/2016  . Diabetes mellitus with complication ([place]) 74/94/4967  . Wrist injury 08/08/2016  . Chronic diastolic congestive heart failure ([place]) 08/08/2016  . Concussion with no loss of consciousness 08/08/2016  . Intracranial bleed ([place])    . BPPV (benign paroxysmal positional vertigo) 06/24/2016  . Memory loss 06/24/2016  . Accidental fall 06/24/2016  . Chronic neck pain 03/18/2016  . Autonomic neuropathy due to diabetes ([place]) 03/18/2016  . Anemia in chronic kidney disease 12/15/2015  . Incomplete bladder emptying 10/15/2015  . Abscess, renal/perirenal 10/15/2015  . Recurrent UTI 10/15/2015  . Weakness 06/02/2015  . Lumbar back pain with radiculopathy affecting left lower extremity 05/05/2015  . Renal abscess 01/28/2015  . Chronic fatigue 01/22/2015  . Chronic idiopathic urticaria 01/17/2015  . S/P hip replacement 05/16/2014  . DNR (do not resuscitate) 02/20/2014  . ALLERGIC RHINITIS 12/17/2008  . Vitamin D deficiency 07/01/2008  . CKD (chronic kidney disease) stage 4, GFR 15-29 ml/min ([facility]) 02/11/2008  . DIVERTICULOSIS OF COLON 02/01/2008  . COLONIC POLYPS, ADENOMATOUS 10/19/2006  . Diabetes mellitus due to underlying condition with diabetic autonomic neuropathy ([place]) 10/19/2006  . Hyperlipidemia 10/19/2006  . OBESITY, NOS 10/19/2006  . Major depression, recurrent ([place]) 10/19/2006  . HYPERTENSION, BENIGN SYSTEMIC 10/19/2006  . Recurrent urticaria 10/19/2006    Past Surgical History:  Procedure Laterality Date  . ABDOMINAL HYSTERECTOMY  1977   fibroid  . JOINT REPLACEMENT  2009   rt hip  . TOTAL HIP ARTHROPLASTY Left 05/12/2014   dr Mayer Camel  . TOTAL HIP ARTHROPLASTY Left 05/12/2014   Procedure: TOTAL HIP ARTHROPLASTY;  Surgeon: Kerin Salen, MD;  Location: [facility];  Service: Orthopedics;  Laterality: Left;  OB History    No data available       Home Medications    Prior to Admission medications   Medication Sig Start Date End Date Taking? Authorizing Provider  ALPRAZolam (XANAX) 0.25 MG tablet Take 1 tablet (0.25 mg total) by mouth 2 (two) times daily as needed for anxiety. 08/09/16  Yes Vann, Jessica U, DO  BIOTIN PO Take 1 tablet by mouth daily.   Yes [provider]  diphenhydrAMINE  (BENADRYL) 25 MG tablet Take 25 mg by mouth every 6 (six) hours as needed for itching.   Yes [provider]  escitalopram (LEXAPRO) 20 MG tablet TAKE 1 TABLET BY MOUTH DAILY Patient taking differently: TAKE 1 TABLET(20mg ) BY MOUTH DAILY 01/23/17  Yes Bedsole, Amy E, MD  fenofibrate 54 MG tablet TAKE TWO (2) TABLETS BY MOUTH DAILY Patient taking differently: TAKE ONE TABLET(54mg ) BY MOUTH TWICE DAILY 05/22/17  Yes Bedsole, Amy E, MD  Multiple Vitamins-Minerals (MULTIVITAMIN WITH MINERALS) tablet Take 1 tablet by mouth daily.   Yes [provider]  Vitamin D, Ergocalciferol, (DRISDOL) 50000 units CAPS capsule Take 50,000 Units by mouth every 7 (seven) days.   Yes [provider]  nystatin (MYCOSTATIN/NYSTOP) powder Apply topically 4 (four) times daily. Patient not taking: Reported on 07/15/2017 11/10/16   Jinny Sanders, MD  tiotropium (SPIRIVA HANDIHALER) 18 MCG inhalation capsule Place 1 capsule (18 mcg total) into inhaler and inhale daily. Patient not taking: Reported on 07/15/2017 09/13/16   Jinny Sanders, MD    Family History Family History  Problem Relation Age of Onset  . COPD Brother   . Heart disease Brother   . Diabetes Brother   . Lymphoma Brother   . Alzheimer's disease Brother   . Pancreatic cancer Sister   . Alzheimer's disease Sister   . Heart disease Mother   . Breast cancer Sister   . Leukemia Other   . Kidney disease Neg Hx     Social History Social History   Tobacco Use  . Smoking status: Former Smoker    Packs/day: 1.00    Years: 45.00    Pack years: 45.00    Types: Cigarettes    Last attempt to quit: 06/16/1999    Years since quitting: 18.0  . Smokeless tobacco: Never Used  . Tobacco comment: quit 15 years ago  Substance Use Topics  . Alcohol use: No    Alcohol/week: 0.0 oz  . Drug use: No     Allergies   Lovastatin; Statins; Sulfa antibiotics; Codeine; and Niacin   Review of Systems Review of Systems  All other systems  reviewed and are negative.    Physical Exam Updated Vital Signs BP 101/87   Pulse 90   Temp 99.4 F (37.4 C) (Oral)   Resp (!) 29   SpO2 95%   Physical Exam  Constitutional: She is oriented to person, place, and time. She appears well-developed and well-nourished.  Non-toxic appearance. No distress.  HENT:  Head: Normocephalic and atraumatic.  Eyes: Conjunctivae, EOM and lids are normal. Pupils are equal, round, and reactive to light.  Neck: Normal range of motion. Neck supple. No tracheal deviation present. No thyroid mass present.  Cardiovascular: Normal rate, regular rhythm and normal heart sounds. Exam reveals no gallop.  No murmur heard. Pulmonary/Chest: Effort normal. No stridor. No respiratory distress. She has decreased breath sounds in the right lower field and the left lower field. She has wheezes in the right lower field and the left lower  field. She has no rhonchi. She has no rales.  Abdominal: Soft. Normal appearance and bowel sounds are normal. She exhibits no distension. There is no tenderness. There is no rebound and no CVA tenderness.  Musculoskeletal: Normal range of motion. She exhibits no edema or tenderness.  Neurological: She is alert and oriented to person, place, and time. She has normal strength. No cranial nerve deficit or sensory deficit. GCS eye subscore is 4. GCS verbal subscore is 5. GCS motor subscore is 6.  Skin: Skin is warm and dry. No abrasion and no rash noted.  Psychiatric: She has a normal mood and affect. Her speech is normal and behavior is normal.  Nursing note and vitals reviewed.    ED Treatments / Results  Labs (all labs ordered are listed, but only abnormal results are displayed) Labs Reviewed  BASIC METABOLIC PANEL - Abnormal; Notable for the following components:      Result Value   Glucose, Bld 125 (*)    BUN 31 (*)    Creatinine, Ser 1.90 (*)    GFR calc non Af Amer 23 (*)    GFR calc Af Amer 27 (*)    All other components  within normal limits  CBC  I-STAT TROPONIN, ED    EKG  EKG Interpretation  Date/Time:  Saturday July 15 2017 17:32:53 EST Ventricular Rate:  96 PR Interval:  134 QRS Duration: 78 QT Interval:  336 QTC Calculation: 424 R Axis:   60 Text Interpretation:  Sinus rhythm with Premature atrial complexes Nonspecific ST and T wave abnormality Abnormal ECG Confirmed by Lacretia Leigh ([id number]) on 07/15/2017 8:10:54 PM       Radiology Dg Chest 2 View  Result Date: 07/15/2017 CLINICAL DATA:  Mid back pain since last evening. EXAM: CHEST  2 VIEW COMPARISON:  09/22/2016 FINDINGS: Heart and mediastinal contours are stable. No mediastinal widening nor aortic aneurysm. Hyperinflated lungs with resolution of focal right upper lobe opacity since prior exam. The opacity may have represented a small focus of atelectasis or infection. No pneumonic consolidation, effusion or pneumothorax. Chronic stable degenerative change along the dorsal spine. IMPRESSION: Hyperinflated lungs without active disease. Mild degenerative change along the dorsal spine without acute fracture. Electronically Signed   By: Ashley Royalty M.D.   On: 07/15/2017 18:38    Procedures Procedures (including critical care time)  Medications Ordered in ED Medications  albuterol (PROVENTIL) (2.5 MG/3ML) 0.083% nebulizer solution 5 mg (5 mg Nebulization Given 07/15/17 1958)  ipratropium (ATROVENT) nebulizer solution 0.5 mg (0.5 mg Nebulization Given 07/15/17 1958)  predniSONE (DELTASONE) tablet 60 mg (60 mg Oral Given 07/15/17 1956)  azithromycin (ZITHROMAX) tablet 500 mg (500 mg Oral Given 07/15/17 1956)     Initial Impression / Assessment and Plan / ED Course  I have reviewed the triage vital signs and the nursing notes.  Pertinent labs & imaging results that were available during my care of the patient were reviewed by me and considered in my medical decision making (see chart for details).     Patient would likely bronchitis  and was given albuterol along with prednisone and feels better.  Will also start on antibiotics for possible early developing infection.  Final Clinical Impressions(s) / ED Diagnoses   Final diagnoses:  None    ED Discharge Orders    None       Lacretia Leigh, MD 07/15/17 2115

## 2017-07-15 NOTE — ED Triage Notes (Signed)
Onset 1 week non productive cough, intermittant chest pain that lasts few minutes radiating to back.  No chest pain at this time.

## 2017-07-17 ENCOUNTER — Encounter: Payer: Self-pay | Admitting: Family Medicine

## 2017-07-18 ENCOUNTER — Ambulatory Visit (INDEPENDENT_AMBULATORY_CARE_PROVIDER_SITE_OTHER): Payer: Medicare Other | Admitting: Family Medicine

## 2017-07-18 ENCOUNTER — Other Ambulatory Visit: Payer: Self-pay

## 2017-07-18 ENCOUNTER — Encounter: Payer: Self-pay | Admitting: Family Medicine

## 2017-07-18 VITALS — BP 120/60 | HR 97 | Temp 97.4°F | Ht 62.5 in | Wt 181.5 lb

## 2017-07-18 DIAGNOSIS — I10 Essential (primary) hypertension: Secondary | ICD-10-CM

## 2017-07-18 DIAGNOSIS — I5032 Chronic diastolic (congestive) heart failure: Secondary | ICD-10-CM

## 2017-07-18 DIAGNOSIS — E0843 Diabetes mellitus due to underlying condition with diabetic autonomic (poly)neuropathy: Secondary | ICD-10-CM

## 2017-07-18 DIAGNOSIS — J441 Chronic obstructive pulmonary disease with (acute) exacerbation: Secondary | ICD-10-CM | POA: Diagnosis not present

## 2017-07-18 DIAGNOSIS — F3341 Major depressive disorder, recurrent, in partial remission: Secondary | ICD-10-CM | POA: Diagnosis not present

## 2017-07-18 DIAGNOSIS — E782 Mixed hyperlipidemia: Secondary | ICD-10-CM | POA: Diagnosis not present

## 2017-07-18 DIAGNOSIS — E118 Type 2 diabetes mellitus with unspecified complications: Secondary | ICD-10-CM | POA: Diagnosis not present

## 2017-07-18 DIAGNOSIS — Z0000 Encounter for general adult medical examination without abnormal findings: Secondary | ICD-10-CM

## 2017-07-18 DIAGNOSIS — J449 Chronic obstructive pulmonary disease, unspecified: Secondary | ICD-10-CM | POA: Insufficient documentation

## 2017-07-18 DIAGNOSIS — N184 Chronic kidney disease, stage 4 (severe): Secondary | ICD-10-CM | POA: Diagnosis not present

## 2017-07-18 LAB — HEMOGLOBIN A1C: HEMOGLOBIN A1C: 7.4 % — AB (ref 4.6–6.5)

## 2017-07-18 LAB — HM DIABETES FOOT EXAM

## 2017-07-18 NOTE — Assessment & Plan Note (Signed)
Improving with antibiotics prednisione and albuterol.  using spiriva daily for control.

## 2017-07-18 NOTE — Assessment & Plan Note (Signed)
Slight worsening .Marland Kitchen Follow.  No clear cause for acute change.

## 2017-07-18 NOTE — Assessment & Plan Note (Signed)
Stable control on lexapro. 

## 2017-07-18 NOTE — Assessment & Plan Note (Signed)
Due for re-eval. 

## 2017-07-18 NOTE — Patient Instructions (Addendum)
Set up yearly eye exam when you can.  Please stop at the lab to have labs drawn.

## 2017-07-18 NOTE — Assessment & Plan Note (Signed)
Discussed foot care and balance issues

## 2017-07-18 NOTE — Assessment & Plan Note (Signed)
Well controlled. Continue current medication.  

## 2017-07-18 NOTE — Progress Notes (Signed)
Subjective:    Patient ID: Jill [name], female    DOB: 1932-04-19, 83 y.o.   MRN: [id number]  HPI  The patient presents for annual medicare wellness, complete physical and review of chronic health problems. He/She also has the following acute concerns today: Recent ED visit on 11/24 for bronchitis/ COPD exac.. Treated with  antibiotics (zpack), albuterol and pred x 5 days.  She reports she has improved significantly.  Less SOB, improved cough. No fever.  Energy now improving.   She also fell, tripped 1 month ago. Not bad enough to treat with pain med.  Using brace.  The patient saw Candis Musa, LPN for medicare wellness. Note reviewed in detail and important notes copied below. Health maintenance: Eye exam - addressed Microalbumin - completed Flu vaccine - administered  Abnormal screenings:  Fall risk - hx of multiple falls with bruising only Hearing -failed             Hearing Screening   125Hz  250Hz  500Hz  1000Hz  2000Hz  3000Hz  4000Hz  6000Hz  8000Hz   Right ear:   40 0 0  0    Left ear:   40 0 0  0     07/18/17 TODAY  Hypertension:   Good control on current regimen.  BP Readings from Last 3 Encounters:  07/18/17 120/60  07/15/17 (!) 162/59  07/03/17 124/80  Using medication without problems or lightheadedness:  Chest pain with exertion:none Edema: none Short of breath: see above Average home BPs: Other issues:  Mild COPD: stable on spiriva.  Elevated Cholesterol: tolerable control for  83 year old.. On fenofibrate.   Pt intolerant to statins.  Lab Results  Component Value Date   CHOL 207 (H) 07/03/2017   HDL 57.70 07/03/2017   LDLCALC 119 (H) 07/03/2017   LDLDIRECT 122.0 07/03/2017   TRIG 147.0 07/03/2017   CHOLHDL 4 07/03/2017  U Diet compliance: Exercise: Other complaints:   MDD, recurrent:   Diabetes:   Due for repeat A1C. Lab Results  Component Value Date   HGBA1C 7.6 (H) 01/11/2017  Using medications without  difficulties: Hypoglycemic episodes: ? Hyperglycemic episodes:? Feet problems: no ulcers.. Frequent falls Blood Sugars averaging: not checking recently eye exam within last year: due   CKD, slightly worsening in last 6 months.   Social History /Family History/Past Medical History reviewed in detail and updated in EMR if needed. Blood pressure 120/60, pulse 97, temperature (!) 97.4 F (36.3 C), temperature source Oral, height 5' 2.5" (1.588 m), weight 181 lb 8 oz (82.3 kg).  Review of Systems  Constitutional: Positive for fatigue. Negative for fever.  HENT: Negative for congestion.   Eyes: Negative for pain.  Respiratory: Negative for cough and shortness of breath.   Cardiovascular: Negative for chest pain, palpitations and leg swelling.  Gastrointestinal: Negative for abdominal pain.  Genitourinary: Negative for dysuria and vaginal bleeding.  Musculoskeletal: Negative for back pain.  Neurological: Negative for syncope, light-headedness and headaches.  Psychiatric/Behavioral: Negative for dysphoric mood.       Objective:   Physical Exam  Constitutional: Vital signs are normal. She appears well-developed and well-nourished. She is cooperative.  Non-toxic appearance. She does not appear ill. No distress.  elderly  HENT:  Head: Normocephalic.  Right Ear: Hearing, tympanic membrane, external ear and ear canal normal. Tympanic membrane is not erythematous, not retracted and not bulging.  Left Ear: Hearing, tympanic membrane, external ear and ear canal normal. Tympanic membrane is not erythematous, not retracted and not bulging.  Nose: No mucosal  edema or rhinorrhea. Right sinus exhibits no maxillary sinus tenderness and no frontal sinus tenderness. Left sinus exhibits no maxillary sinus tenderness and no frontal sinus tenderness.  Mouth/Throat: Uvula is midline, oropharynx is clear and moist and mucous membranes are normal.  Eyes: Conjunctivae, EOM and lids are normal. Pupils are  equal, round, and reactive to light. Lids are everted and swept, no foreign bodies found.  Neck: Trachea normal and normal range of motion. Neck supple. Carotid bruit is not present. No thyroid mass and no thyromegaly present.  Cardiovascular: Regular rhythm, S1 normal, S2 normal, normal heart sounds, intact distal pulses and normal pulses. Tachycardia present. Exam reveals no gallop and no friction rub.  No murmur heard. Pulmonary/Chest: Effort normal and breath sounds normal. No tachypnea. No respiratory distress. She has no decreased breath sounds. She has no wheezes. She has no rhonchi. She has no rales.  Abdominal: Soft. Normal appearance and bowel sounds are normal. There is no tenderness.  Neurological: She is alert.  Skin: Skin is warm, dry and intact. No rash noted.  Psychiatric: Her speech is normal and behavior is normal. Judgment and thought content normal. Her mood appears not anxious. Cognition and memory are normal. She does not exhibit a depressed mood.     Diabetic foot exam: Normal inspection No skin breakdown No calluses  Normal DP pulses decreased sensation to light touch and monofilament Nails normal      Assessment & Plan:  The patient's preventative maintenance and recommended screening tests for an annual wellness exam were reviewed in full today. Brought up to date unless services declined.  Counselled on the importance of diet, exercise, and its role in overall health and mortality. The patient's FH and SH was reviewed, including their home life, tobacco status, and drug and alcohol status.     Reviewed vaccines  no indication for colonoscopy, mammogram, PAP/DVE  DEXA: refused. Last normal in 2009.

## 2017-07-18 NOTE — Assessment & Plan Note (Signed)
Euvloemic today.

## 2017-08-01 ENCOUNTER — Encounter: Payer: Self-pay | Admitting: Family Medicine

## 2017-08-03 ENCOUNTER — Encounter: Payer: Self-pay | Admitting: Family Medicine

## 2017-09-15 ENCOUNTER — Ambulatory Visit (INDEPENDENT_AMBULATORY_CARE_PROVIDER_SITE_OTHER): Payer: Medicare Other | Admitting: Family Medicine

## 2017-09-15 ENCOUNTER — Other Ambulatory Visit: Payer: Self-pay

## 2017-09-15 ENCOUNTER — Encounter: Payer: Self-pay | Admitting: Family Medicine

## 2017-09-15 ENCOUNTER — Ambulatory Visit
Admission: RE | Admit: 2017-09-15 | Discharge: 2017-09-15 | Disposition: A | Discharge status: Home / Self Care | Payer: Medicare Other | Source: Ambulatory Visit | Attending: Family Medicine | Admitting: Family Medicine

## 2017-09-15 ENCOUNTER — Telehealth: Payer: Self-pay

## 2017-09-15 VITALS — BP 140/96 | HR 81 | Temp 97.4°F | Ht 62.5 in | Wt 177.5 lb

## 2017-09-15 DIAGNOSIS — R51 Headache: Secondary | ICD-10-CM | POA: Diagnosis not present

## 2017-09-15 DIAGNOSIS — R519 Headache, unspecified: Secondary | ICD-10-CM

## 2017-09-15 DIAGNOSIS — W19XXXA Unspecified fall, initial encounter: Secondary | ICD-10-CM | POA: Insufficient documentation

## 2017-09-15 DIAGNOSIS — S0990XA Unspecified injury of head, initial encounter: Secondary | ICD-10-CM | POA: Diagnosis not present

## 2017-09-15 NOTE — Telephone Encounter (Signed)
John at [facility] called report pt is waiting. Report in Epic and copy taken to Dr Diona Browner.

## 2017-09-15 NOTE — Patient Instructions (Signed)
Please stop at the front desk to set up referral.  

## 2017-09-15 NOTE — Progress Notes (Signed)
Subjective:    Patient ID: Jill [name], female    DOB: October 24, 1931, 84 y.o.   MRN: [id number]  HPI 84 year old female presents following fall and head injury  4-5 weeks ago now with headaches.  Accidental fall , tripped... Hit head anteriorly, bruise on forehead.  no CP, no SOB, no heart racing.  NO LOC. No confusion.  Now in last 4-5 days.Hervey Ard pain in right forehead, severe sharp lightning like.  This AM when combing her hair.. She had  distorted vision lasting 3-4 min. Legs felt weak. No numbness. No slurred speech. No nausea, no vomiting.   Hx of intraparenchymal hematoma of brain ( left occipital)  Intracranial bleed in 07/2016  Currently on no blood thinner.    Social History /Family History/Past Medical History reviewed in detail and updated in EMR if needed. Blood pressure (!) 140/96, pulse 81, temperature (!) 97.4 F (36.3 C), temperature source Oral, height 5' 2.5" (1.588 m), weight 177 lb 8 oz (80.5 kg).  BP Readings from Last 3 Encounters:  09/15/17 (!) 140/96  07/18/17 120/60  07/15/17 (!) 162/59     Review of Systems  Constitutional: Negative for fatigue and fever.  HENT: Negative for congestion.   Eyes: Negative for pain.  Respiratory: Negative for cough and shortness of breath.   Cardiovascular: Negative for chest pain, palpitations and leg swelling.  Gastrointestinal: Negative for abdominal pain.  Genitourinary: Negative for dysuria and vaginal bleeding.  Musculoskeletal: Negative for back pain.  Neurological: Positive for weakness and headaches. Negative for syncope and light-headedness.  Psychiatric/Behavioral: Negative for dysphoric mood.       Objective:   Physical Exam  Constitutional: She is oriented to person, place, and time. Vital signs are normal. She appears well-developed and well-nourished. She is cooperative.  Non-toxic appearance. She does not appear ill. No distress.  HENT:  Head: Normocephalic.  Right Ear: Hearing, tympanic  membrane, external ear and ear canal normal. Tympanic membrane is not erythematous, not retracted and not bulging.  Left Ear: Hearing, tympanic membrane, external ear and ear canal normal. Tympanic membrane is not erythematous, not retracted and not bulging.  Nose: No mucosal edema or rhinorrhea. Right sinus exhibits no maxillary sinus tenderness and no frontal sinus tenderness. Left sinus exhibits no maxillary sinus tenderness and no frontal sinus tenderness.  Mouth/Throat: Uvula is midline, oropharynx is clear and moist and mucous membranes are normal.  Eyes: Conjunctivae, EOM and lids are normal. Pupils are equal, round, and reactive to light. Lids are everted and swept, no foreign bodies found.  Neck: Trachea normal and normal range of motion. Neck supple. Carotid bruit is not present. No thyroid mass and no thyromegaly present.  Cardiovascular: Normal rate, regular rhythm, S1 normal, S2 normal, normal heart sounds, intact distal pulses and normal pulses. Exam reveals no gallop and no friction rub.  No murmur heard. Pulmonary/Chest: Effort normal and breath sounds normal. No tachypnea. No respiratory distress. She has no decreased breath sounds. She has no wheezes. She has no rhonchi. She has no rales.  Abdominal: Soft. Normal appearance and bowel sounds are normal. There is no tenderness.  Neurological: She is alert and oriented to person, place, and time. She has normal strength and normal reflexes. No cranial nerve deficit or sensory deficit. She exhibits normal muscle tone. She displays a negative Romberg sign. Coordination and gait normal. GCS eye subscore is 4. GCS verbal subscore is 5. GCS motor subscore is 6.  Nml cerebellar exam   No  papilledema   shuffling gait  Skin: Skin is warm, dry and intact. No rash noted.  Psychiatric: She has a normal mood and affect. Her speech is normal and behavior is normal. Judgment and thought content normal. Her mood appears not anxious. Cognition and  memory are normal. Cognition and memory are not impaired. She does not exhibit a depressed mood. She exhibits normal recent memory and normal remote memory.          Assessment & Plan:   Accidental fall, head injury in pt with hx of intracranial bleed. Not on anticoagulation.  Send for stat head CT to rule out bleed.  Current neuro exam nm and pt non toxic.

## 2017-09-21 ENCOUNTER — Encounter: Payer: Self-pay | Admitting: Family Medicine

## 2017-09-22 ENCOUNTER — Encounter: Payer: Self-pay | Admitting: Family Medicine

## 2017-09-22 ENCOUNTER — Other Ambulatory Visit: Payer: Self-pay

## 2017-09-22 ENCOUNTER — Ambulatory Visit: Payer: Medicare Other | Admitting: Family Medicine

## 2017-09-22 VITALS — BP 140/90 | HR 72 | Temp 97.9°F | Ht 62.5 in | Wt 177.5 lb

## 2017-09-22 DIAGNOSIS — R51 Headache: Secondary | ICD-10-CM

## 2017-09-22 DIAGNOSIS — I10 Essential (primary) hypertension: Secondary | ICD-10-CM | POA: Diagnosis not present

## 2017-09-22 DIAGNOSIS — R519 Headache, unspecified: Secondary | ICD-10-CM

## 2017-09-22 LAB — BASIC METABOLIC PANEL
BUN: 35 mg/dL — ABNORMAL HIGH (ref 6–23)
CALCIUM: 9.5 mg/dL (ref 8.4–10.5)
CO2: 31 mEq/L (ref 19–32)
Chloride: 102 mEq/L (ref 96–112)
Creatinine, Ser: 2 mg/dL — ABNORMAL HIGH (ref 0.40–1.20)
GFR: 25.25 mL/min — ABNORMAL LOW (ref >60.00)
Glucose, Bld: 101 mg/dL — ABNORMAL HIGH (ref 70–99)
POTASSIUM: 5.1 meq/L (ref 3.5–5.1)
SODIUM: 139 meq/L (ref 135–145)

## 2017-09-22 MED ORDER — HYDROCHLOROTHIAZIDE 25 MG PO TABS: 25.0000 mg | ORAL_TABLET | Freq: Every day | ORAL | 3 refills | Status: DC

## 2017-09-22 NOTE — Patient Instructions (Addendum)
Start back on lexapro if not taking.  Cut back back on salt in diet.  Please stop at the lab to have labs drawn.  Start HCTZ daily.  Follow BP at home.. Call/mychart BP measurements in next week.

## 2017-09-22 NOTE — Progress Notes (Signed)
Subjective:    Patient ID: Jill [name], female    DOB: August 04, 1932, 84 y.o.   MRN: [id number]  HPI 84 year old female presents for follow up head injury and headaches.   She has only had headache x 1 in last 3 days. Before that was pretty severe.   Head CT was unremarkable on 1/25. IMPRESSION: 1. No acute finding.  No evidence of recent intracranial injury. 2. No noted change since 08/08/2016 brain MRI. 3. Senescent/degenerative changes described above.   Her daughter in law noted she has been off lexapro which may have been causing headaches.  At home BP reading elevated 165/94-175.80 She has been eating more salt lately. BP Readings from Last 3 Encounters:  09/22/17 140/90  09/15/17 (!) 140/96  07/18/17 120/60   Wt Readings from Last 3 Encounters:  09/22/17 177 lb 8 oz (80.5 kg)  09/15/17 177 lb 8 oz (80.5 kg)  07/18/17 181 lb 8 oz (82.3 kg)      Blood pressure 140/90, pulse 72, temperature 97.9 F (36.6 C), temperature source Oral, height 5' 2.5" (1.588 m), weight 177 lb 8 oz (80.5 kg).    Review of Systems  Constitutional: Negative for fatigue and fever.  HENT: Negative for congestion.   Eyes: Negative for pain.  Respiratory: Negative for cough and shortness of breath.   Cardiovascular: Negative for chest pain, palpitations and leg swelling.  Gastrointestinal: Negative for abdominal pain.  Genitourinary: Negative for dysuria and vaginal bleeding.  Musculoskeletal: Negative for back pain.  Neurological: Negative for syncope and light-headedness.  Psychiatric/Behavioral: Negative for dysphoric mood.       Objective:   Physical Exam  Constitutional: She is oriented to person, place, and time. Vital signs are normal. She appears well-developed and well-nourished. She is cooperative.  Non-toxic appearance. She does not appear ill. No distress.  HENT:  Head: Normocephalic.  Right Ear: Hearing, tympanic membrane, external ear and ear canal normal. Tympanic  membrane is not erythematous, not retracted and not bulging.  Left Ear: Hearing, tympanic membrane, external ear and ear canal normal. Tympanic membrane is not erythematous, not retracted and not bulging.  Nose: No mucosal edema or rhinorrhea. Right sinus exhibits no maxillary sinus tenderness and no frontal sinus tenderness. Left sinus exhibits no maxillary sinus tenderness and no frontal sinus tenderness.  Mouth/Throat: Uvula is midline, oropharynx is clear and moist and mucous membranes are normal.  Eyes: Conjunctivae, EOM and lids are normal. Pupils are equal, round, and reactive to light. Lids are everted and swept, no foreign bodies found.  Neck: Trachea normal and normal range of motion. Neck supple. Carotid bruit is not present. No thyroid mass and no thyromegaly present.  Cardiovascular: Normal rate, regular rhythm, S1 normal, S2 normal, normal heart sounds, intact distal pulses and normal pulses. Exam reveals no gallop and no friction rub.  No murmur heard. Pulmonary/Chest: Effort normal and breath sounds normal. No tachypnea. No respiratory distress. She has no decreased breath sounds. She has no wheezes. She has no rhonchi. She has no rales.  Abdominal: Soft. Normal appearance and bowel sounds are normal. There is no tenderness.  Neurological: She is alert and oriented to person, place, and time. She has normal strength and normal reflexes. No cranial nerve deficit or sensory deficit. She exhibits normal muscle tone. She displays a negative Romberg sign. Coordination and gait normal. GCS eye subscore is 4. GCS verbal subscore is 5. GCS motor subscore is 6.  Nml cerebellar exam   No papilledema  shuffling gait  Skin: Skin is warm, dry and intact. No rash noted.  Psychiatric: She has a normal mood and affect. Her speech is normal and behavior is normal. Judgment and thought content normal. Her mood appears not anxious. Cognition and memory are normal. Cognition and memory are not  impaired. She does not exhibit a depressed mood. She exhibits normal recent memory and normal remote memory.          Assessment & Plan:

## 2017-09-26 DIAGNOSIS — R51 Headache: Secondary | ICD-10-CM

## 2017-09-26 DIAGNOSIS — R519 Headache, unspecified: Secondary | ICD-10-CM | POA: Insufficient documentation

## 2017-09-26 LAB — HM DIABETES EYE EXAM

## 2017-09-26 NOTE — Assessment & Plan Note (Signed)
Neg recent head CT.Marland Kitchen No clear bleed.  Headache may be due to stopping SSRI suddenly. ? Due to BP elevations Nml neuro exam today.

## 2017-09-26 NOTE — Assessment & Plan Note (Signed)
Recent headache likely due  in part to BP increase and being off lexapro suddenly.  Start HCTZ and eval with labs.

## 2017-09-27 ENCOUNTER — Encounter: Payer: Self-pay | Admitting: Family Medicine

## 2017-09-28 ENCOUNTER — Encounter: Payer: Self-pay | Admitting: Family Medicine

## 2017-09-29 ENCOUNTER — Other Ambulatory Visit: Payer: Self-pay | Admitting: Family Medicine

## 2017-12-12 ENCOUNTER — Other Ambulatory Visit: Payer: Self-pay | Admitting: Family Medicine

## 2017-12-12 NOTE — Telephone Encounter (Signed)
Last office visit 09/22/2017.  Last Vit D 07/03/2017 normal at 84.40 ng/ml.  Refill?

## 2017-12-26 ENCOUNTER — Encounter: Payer: Self-pay | Admitting: Family Medicine

## 2017-12-28 ENCOUNTER — Ambulatory Visit: Payer: Medicare Other | Admitting: Internal Medicine

## 2017-12-28 ENCOUNTER — Encounter: Payer: Self-pay | Admitting: Internal Medicine

## 2017-12-28 VITALS — BP 126/68 | HR 73 | Temp 97.6°F

## 2017-12-28 DIAGNOSIS — M545 Low back pain, unspecified: Secondary | ICD-10-CM

## 2017-12-28 DIAGNOSIS — R350 Frequency of micturition: Secondary | ICD-10-CM | POA: Diagnosis not present

## 2017-12-28 LAB — POC URINALSYSI DIPSTICK (AUTOMATED)
Bilirubin, UA: NEGATIVE
Blood, UA: NEGATIVE
Glucose, UA: NEGATIVE
KETONES UA: NEGATIVE
NITRITE UA: NEGATIVE
PH UA: 6 (ref 5.0–8.0)
PROTEIN UA: NEGATIVE
Spec Grav, UA: 1.015 (ref 1.010–1.025)
UROBILINOGEN UA: 0.2 U/dL

## 2017-12-28 MED ORDER — NITROFURANTOIN MONOHYD MACRO 100 MG PO CAPS: 100.0000 mg | ORAL_CAPSULE | Freq: Two times a day (BID) | ORAL | 0 refills | Status: DC

## 2017-12-28 NOTE — Patient Instructions (Signed)

## 2017-12-28 NOTE — Progress Notes (Signed)
HPI  Pt presents to the clinic today with c/o urinary frequency and low back pain. She reports this started 2-3 days ago. She denies urgency, dysuria, fever, chills or nausea. Her bowels are moving ok. She denies vaginal complaints. She has not tried anything OTC for her symptoms.   Review of Systems  Past Medical History:  Diagnosis Date  . Allergy   . Arthritis    osteoarthritis   . Chronic kidney disease   . Complication of anesthesia    difficult waking   . Depression   . Diabetes mellitus   . Diverticula, colon   . Family history of anesthesia complication    Son is difficult intubation  . Hyperlipidemia   . Hypertension   . Vasovagal syncope 2006   Negative cardiac workup-myoview, echo    Family History  Problem Relation Age of Onset  . COPD Brother   . Heart disease Brother   . Diabetes Brother   . Lymphoma Brother   . Alzheimer's disease Brother   . Pancreatic cancer Sister   . Alzheimer's disease Sister   . Heart disease Mother   . Breast cancer Sister   . Leukemia Other   . Kidney disease Neg Hx     Social History   Socioeconomic History  . Marital status: Widowed    Spouse name: Not on file  . Number of children: Not on file  . Years of education: Not on file  . Highest education level: Not on file  Occupational History  . Not on file  Social Needs  . Financial resource strain: Not on file  . Food insecurity:    Worry: Not on file    Inability: Not on file  . Transportation needs:    Medical: Not on file    Non-medical: Not on file  Tobacco Use  . Smoking status: Former Smoker    Packs/day: 1.00    Years: 45.00    Pack years: 45.00    Types: Cigarettes    Last attempt to quit: 06/16/1999    Years since quitting: 18.5  . Smokeless tobacco: Never Used  . Tobacco comment: quit 15 years ago  Substance and Sexual Activity  . Alcohol use: No    Alcohol/week: 0.0 oz  . Drug use: No  . Sexual activity: Not Currently  Lifestyle  . Physical  activity:    Days per week: Not on file    Minutes per session: Not on file  . Stress: Not on file  Relationships  . Social connections:    Talks on phone: Not on file    Gets together: Not on file    Attends religious service: Not on file    Active member of club or organization: Not on file    Attends meetings of clubs or organizations: Not on file    Relationship status: Not on file  . Intimate partner violence:    Fear of current or ex partner: Not on file    Emotionally abused: Not on file    Physically abused: Not on file    Forced sexual activity: Not on file  Other Topics Concern  . 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 Diet: healthy  diet      Son Antony Haste [place] , has living will and DNR ( reviewed 2015)                Allergies  Allergen Reactions  . Lovastatin Other (See Comments)    REACTION: leg pain  . Statins Other (See Comments)    REACTION: leg cramps, weakness  . Sulfa Antibiotics Hives and Itching  . Codeine Rash  . Niacin Rash    Flushing also     Constitutional: Denies fever, malaise, fatigue, headache or abrupt weight changes.   GU: Pt reports frequency and low back pain. Denies urgency, dysuria, burning sensation, blood in urine, odor or discharge. Skin: Denies redness, rashes, lesions or ulcercations.   No other specific complaints in a complete review of systems (except as listed in HPI above).    Objective:   Physical Exam  There were no vitals taken for this visit. Wt Readings from Last 3 Encounters:  09/22/17 177 lb 8 oz (80.5 kg)  09/15/17 177 lb 8 oz (80.5 kg)  07/18/17 181 lb 8 oz (82.3 kg)    General: Appears her stated age, well developed, well nourished in  NAD. Abdomen: Soft. Normal bowel sounds. No distention or masses noted.  Tender to palpation over the bladder area. No CVA tenderness.        Assessment & Plan:   Frequency, Low Back Pain secondary to Possible UTI:  Urinalysis: 3+ leuks Will send urine culture eRx sent if for Macrobid 100 mg BID x 5 days OK to take AZO OTC Drink plenty of fluids  RTC as needed or if symptoms persist. Webb Silversmith, NP

## 2017-12-28 NOTE — Telephone Encounter (Signed)
Jill [name].. Please see the Mychart notes for this pt.. Daughter wishes to birng in urine sample. IF done today 5/9/ while I am out of office: send out for UA, micro and culture. Please contact daughter so she knows how to collect etc.

## 2017-12-29 ENCOUNTER — Encounter: Payer: Self-pay | Admitting: Internal Medicine

## 2017-12-30 LAB — URINE CULTURE
MICRO NUMBER:: 90567634
SPECIMEN QUALITY:: ADEQUATE

## 2018-01-08 ENCOUNTER — Other Ambulatory Visit: Payer: Self-pay | Admitting: Family Medicine

## 2018-03-06 ENCOUNTER — Encounter: Payer: Self-pay | Admitting: Family Medicine

## 2018-03-07 ENCOUNTER — Ambulatory Visit: Payer: Medicare Other | Admitting: Family Medicine

## 2018-03-07 ENCOUNTER — Encounter: Payer: Self-pay | Admitting: Family Medicine

## 2018-03-07 VITALS — BP 130/68 | HR 81 | Temp 98.0°F | Ht 62.5 in | Wt 166.5 lb

## 2018-03-07 DIAGNOSIS — H8110 Benign paroxysmal vertigo, unspecified ear: Secondary | ICD-10-CM

## 2018-03-07 DIAGNOSIS — H539 Unspecified visual disturbance: Secondary | ICD-10-CM

## 2018-03-07 NOTE — Patient Instructions (Signed)
I think this was benign positional vertigo - do exercises provided today.  If recurrent vision change - schedule eye doctor appointment.  Return to see Dr Diona Browner for routine follow up.

## 2018-03-07 NOTE — Telephone Encounter (Signed)
I spoke with Estill Bamberg (DPR signed) and scheduled appt 03/07/18 at 11:15 with DR G. Estill Bamberg said on 03/05/18 pt felt like her care was turning over and over again but car was sitting still. Pt just told Estill Bamberg last night; today pt is feeling OK, no weakness in extremities, no slurred speech, no pain. FYI to DR G.

## 2018-03-07 NOTE — Assessment & Plan Note (Signed)
Unclear cause of blue circle in vision. Encouraged schedule eye exam as overdue.

## 2018-03-07 NOTE — Progress Notes (Addendum)
BP 130/68 (BP Location: Left Arm, Patient Position: Sitting, Cuff Size: Normal)   Pulse 81   Temp 98 F (36.7 C) (Oral)   Ht 5' 2.5" (1.588 m)   Wt 166 lb 8 oz (75.5 kg)   SpO2 94%   BMI 29.97 kg/m    CC: dizzy episode Subjective:    Patient ID: Jill [name], female    DOB: Jun 28, 1932, 84 y.o.   MRN: [id number]  HPI: Jill [name] is a 84 y.o. female presenting on 03/07/2018 for Other (On 03/05/18, pt was sitting in driveway at home and had feeling that the car was rolling over and over. Had similar feeling that night in bed. Felt like the room was spinning. Pt accompained by her son. )   2 days ago while driving back from [place] (1/2 mile away) had episode where she felt truck was spinning even though it was still, also saw blue circle in her right vision with different colors inside of it. This circle did not move but colors inside did. This lasted about 30 seconds. Later that night had another episode of vertigo "room spinning" when laying supine in bed worse with head turns - this was of longer duration. Yesterday felt more tired, but didn't have any recurrent spells of vertigo or vision changes. She was worried she was havinga stroke.   No new medicines, supplements, OTC.   Denies chest pain, headache, dyspnea, numbness or weakness, palpitations, other vision changes.   CKD stage 4 on latest BMP 09/2017 - chronic issue.  H/o BPPV on problem list.  11 lb weight loss since last 5 months noted.  Known diabetic not on medication. Lab Results  Component Value Date   HGBA1C 7.4 (H) 07/18/2017  On benadryl for chronic hives - none recently  In wheelchair today - this is chronic.   Relevant past medical, surgical, family and social history reviewed and updated as indicated. Interim medical history since our last visit reviewed. Allergies and medications reviewed and updated. Outpatient Medications Prior to Visit  Medication Sig Dispense Refill  . albuterol (PROVENTIL  HFA;VENTOLIN HFA) 108 (90 Base) MCG/ACT inhaler Inhale 2 puffs into the lungs every 4 (four) hours as needed for wheezing or shortness of breath. 1 Inhaler 0  . ALPRAZolam (XANAX) 0.25 MG tablet Take 1 tablet (0.25 mg total) by mouth 2 (two) times daily as needed for anxiety.    Marland Kitchen BIOTIN PO Take 1 tablet by mouth daily.    . Cholecalciferol (VITAMIN D3) 50000 units CAPS TAKE 1 CAPSULE BY MOUTH ONCE WEEKLY 12 capsule 1  . diphenhydrAMINE (BENADRYL) 25 MG tablet Take 25 mg by mouth every 6 (six) hours as needed for itching.    . escitalopram (LEXAPRO) 20 MG tablet TAKE 1 TABLET BY MOUTH DAILY 90 tablet 1  . fenofibrate 54 MG tablet TAKE ONE TABLET(54mg ) BY MOUTH TWICE DAILY 180 tablet 1  . hydrochlorothiazide (HYDRODIURIL) 25 MG tablet Take 0.5 tablets (12.5 mg total) by mouth daily.    . Multiple Vitamins-Minerals (MULTIVITAMIN WITH MINERALS) tablet Take 1 tablet by mouth daily.    Marland Kitchen nystatin (MYCOSTATIN/NYSTOP) powder Apply topically 4 (four) times daily. 56.7 g 3  . tiotropium (SPIRIVA HANDIHALER) 18 MCG inhalation capsule Place 1 capsule (18 mcg total) into inhaler and inhale daily. 30 capsule 12  . Vitamin D, Ergocalciferol, (DRISDOL) 50000 units CAPS capsule Take 50,000 Units by mouth every 7 (seven) days.    . hydrochlorothiazide (HYDRODIURIL) 25 MG tablet Take 1 tablet (25  mg total) by mouth daily. 90 tablet 3  . nitrofurantoin, macrocrystal-monohydrate, (MACROBID) 100 MG capsule Take 1 capsule (100 mg total) by mouth 2 (two) times daily. 10 capsule 0   No facility-administered medications prior to visit.      Per HPI unless specifically indicated in ROS section below Review of Systems     Objective:    BP 130/68 (BP Location: Left Arm, Patient Position: Sitting, Cuff Size: Normal)   Pulse 81   Temp 98 F (36.7 C) (Oral)   Ht 5' 2.5" (1.588 m)   Wt 166 lb 8 oz (75.5 kg)   SpO2 94%   BMI 29.97 kg/m   Wt Readings from Last 3 Encounters:  03/07/18 166 lb 8 oz (75.5 kg)  09/22/17  177 lb 8 oz (80.5 kg)  09/15/17 177 lb 8 oz (80.5 kg)    Physical Exam  Constitutional: She appears well-developed and well-nourished. No distress.  In wheelchair today - this is chronic  HENT:  Mouth/Throat: Oropharynx is clear and moist. No oropharyngeal exudate.  Cardiovascular: Normal rate, regular rhythm and normal heart sounds.  No murmur heard. Pulmonary/Chest: Effort normal and breath sounds normal. No respiratory distress. She has no wheezes. She has no rales.  Coarse breath sounds  Musculoskeletal: She exhibits no edema.  Neurological: She is alert. No cranial nerve deficit or sensory deficit. She displays a negative Romberg sign. Coordination (unsteady on her feet - chronic) abnormal.  CN 2-12  EOMI  FTN intact  In wheelchair due to chronic unsteadiness.  Unsteady on her feet. Unable to do dix hallpike.  Skin: Skin is warm and dry. No rash noted.  Psychiatric: She has a normal mood and affect.  Nursing note and vitals reviewed.  Lab Results  Component Value Date   CREATININE 2.00 (H) 09/22/2017   BUN 35 (H) 09/22/2017   NA 139 09/22/2017   K 5.1 09/22/2017   CL 102 09/22/2017   CO2 31 09/22/2017   Lab Results  Component Value Date   HGBA1C 7.4 (H) 07/18/2017       Assessment & Plan:  Encouraged PCP f/u as overdue.  Problem List Items Addressed This Visit    Vision changes    Unclear cause of blue circle in vision. Encouraged schedule eye exam as overdue.       BPPV (benign paroxysmal positional vertigo) - Primary    Anticipate vertigo symptoms due to benign positional vertigo - positional in nature, each episode of limited duration, today with benign non-focal neurological exam. Doubt stroke due to same. Provided with modified epley maneuvers to do at home - pt endorses familiarity with these exercises as she has had to do them in the past. Update if not improving with treatment.           No orders of the defined types were placed in this  encounter.  No orders of the defined types were placed in this encounter.   Follow up plan: Return if symptoms worsen or fail to improve.  Ria Bush, MD

## 2018-03-07 NOTE — Assessment & Plan Note (Signed)
Anticipate vertigo symptoms due to benign positional vertigo - positional in nature, each episode of limited duration, today with benign non-focal neurological exam. Doubt stroke due to same. Provided with modified epley maneuvers to do at home - pt endorses familiarity with these exercises as she has had to do them in the past. Update if not improving with treatment.

## 2018-04-11 ENCOUNTER — Other Ambulatory Visit: Payer: Self-pay | Admitting: Family Medicine

## 2018-05-28 ENCOUNTER — Encounter: Payer: Self-pay | Admitting: Family Medicine

## 2018-05-28 ENCOUNTER — Ambulatory Visit: Payer: Medicare Other | Admitting: Family Medicine

## 2018-05-28 ENCOUNTER — Ambulatory Visit (INDEPENDENT_AMBULATORY_CARE_PROVIDER_SITE_OTHER)
Admission: RE | Admit: 2018-05-28 | Discharge: 2018-05-28 | Disposition: A | Discharge status: Home / Self Care | Payer: Medicare Other | Source: Ambulatory Visit | Attending: Family Medicine | Admitting: Family Medicine

## 2018-05-28 VITALS — BP 92/60 | HR 86 | Temp 98.1°F | Ht 62.5 in | Wt 162.0 lb

## 2018-05-28 DIAGNOSIS — R05 Cough: Secondary | ICD-10-CM

## 2018-05-28 DIAGNOSIS — J189 Pneumonia, unspecified organism: Secondary | ICD-10-CM

## 2018-05-28 DIAGNOSIS — M10371 Gout due to renal impairment, right ankle and foot: Secondary | ICD-10-CM | POA: Diagnosis not present

## 2018-05-28 DIAGNOSIS — R059 Cough, unspecified: Secondary | ICD-10-CM

## 2018-05-28 DIAGNOSIS — J449 Chronic obstructive pulmonary disease, unspecified: Secondary | ICD-10-CM | POA: Diagnosis not present

## 2018-05-28 MED ORDER — TIOTROPIUM BROMIDE MONOHYDRATE 18 MCG IN CAPS: 18.0000 ug | ORAL_CAPSULE | Freq: Every day | RESPIRATORY_TRACT | 1 refills | Status: DC

## 2018-05-28 MED ORDER — LEVOFLOXACIN 250 MG PO TABS
ORAL_TABLET | ORAL | 0 refills | Status: DC
Start: 2018-05-28 — End: 2018-06-02

## 2018-05-28 MED ORDER — ALBUTEROL SULFATE HFA 108 (90 BASE) MCG/ACT IN AERS: 2.0000 | INHALATION_SPRAY | RESPIRATORY_TRACT | 1 refills | Status: DC | PRN

## 2018-05-28 MED ORDER — COLCHICINE 0.6 MG PO TABS: 0.6000 mg | ORAL_TABLET | Freq: Every day | ORAL | 0 refills | Status: DC | PRN

## 2018-05-28 NOTE — Patient Instructions (Addendum)
Likely gout on the right foot.  Stat colchicine, take daily as needed.  Stop when better.   Go to the lab on the way out.  We'll contact you with your xray report. Restart spiriva in the meantime, use albuterol as needed.  Take care.  Glad to see you.  Update [place] as needed.   Keep your follow up appointment with Dr. Diona Browner.

## 2018-05-28 NOTE — Progress Notes (Signed)
R1st MTP red and painful. Pain with minimal pressure.  History of gout.  Symptoms noted recently.  No trauma.  Recently seen an renal clinic with BP similar to today.  HCTZ was stopped and midodrine was started.  Nephrology notes reviewed.  Chronic renal insufficiency noted.  Not on dialysis.  She is out of her inhalers.  She ran out at some point, not a clear stop date.  She needed a refill.  Done at office visit.  No fevers documented but she has some chills. No sputum.  No vomiting.  Some wheeze.  Some cough.  H/o PNA a few years ago.  Fatigue noted.  PMH and SH reviewed  ROS: Per HPI unless specifically indicated in ROS section   Meds, vitals, and allergies reviewed.   GEN: nad, alert and oriented, elderly woman speaking in complete sentences, in wheelchair. HEENT: mucous membranes moist NECK: supple w/o LA CV: rrr. PULM: ctab, no inc wob, no focal decrease in breath sounds. ABD: soft, +bs EXT: no edema SKIN: no acute rash but she has right first MTP redness swelling and tenderness.

## 2018-05-29 ENCOUNTER — Encounter: Payer: Self-pay | Admitting: Family Medicine

## 2018-05-29 ENCOUNTER — Other Ambulatory Visit: Payer: Self-pay | Admitting: Family Medicine

## 2018-05-29 DIAGNOSIS — J189 Pneumonia, unspecified organism: Secondary | ICD-10-CM | POA: Insufficient documentation

## 2018-05-29 DIAGNOSIS — R911 Solitary pulmonary nodule: Secondary | ICD-10-CM | POA: Insufficient documentation

## 2018-05-29 MED ORDER — BENZONATATE 100 MG PO CAPS: 100.0000 mg | ORAL_CAPSULE | Freq: Three times a day (TID) | ORAL | 0 refills | Status: DC | PRN

## 2018-05-29 MED ORDER — HYDROCODONE-HOMATROPINE 5-1.5 MG/5ML PO SYRP: 5.0000 mL | ORAL_SOLUTION | Freq: Every evening | ORAL | 0 refills | Status: DC | PRN

## 2018-05-29 NOTE — Assessment & Plan Note (Signed)
Chest x-ray noted.    1. Somewhat prominent markings at the right lung base may reflect developing pneumonia. Consider follow-up chest x-ray. 2. Vague nodular opacity at the right costophrenic angle with second vague nodular opacity in the left upper lobe. Recommend follow-up chest x-ray after treatment to assess further.  Would treat for PNA, abx sent-renally dose Levaquin, will defer to PCP about f/u CXR in about 6 weeks.  Routine cautions given regarding follow-up prior to leaving the office.  At this point still okay for outpatient follow-up.  She has family help. >25 minutes spent in face to face time with patient, >50% spent in counselling or coordination of care.

## 2018-05-29 NOTE — Assessment & Plan Note (Signed)
Restart inhalers.  Routine cautions given.  Prescription sent.  I will defer to PCP otherwise.

## 2018-05-29 NOTE — Assessment & Plan Note (Signed)
Likely.  Okay to try colchicine once daily.  Routine cautions given.  Rationale for treatment discussed with patient.  Prescription sent.  She is off hydrochlorthiazide now.  Routed to PCP as FYI.

## 2018-05-31 ENCOUNTER — Other Ambulatory Visit: Payer: Self-pay

## 2018-05-31 ENCOUNTER — Ambulatory Visit: Payer: Self-pay | Admitting: *Deleted

## 2018-05-31 ENCOUNTER — Encounter (HOSPITAL_COMMUNITY): Payer: Self-pay | Admitting: Emergency Medicine

## 2018-05-31 ENCOUNTER — Inpatient Hospital Stay (HOSPITAL_COMMUNITY)
Admission: EM | Admit: 2018-05-31 | Discharge: 2018-06-02 | DRG: 193 | Disposition: A | Discharge status: Home Health | Payer: Medicare Other | Attending: Internal Medicine | Admitting: Internal Medicine

## 2018-05-31 ENCOUNTER — Emergency Department (HOSPITAL_COMMUNITY): Payer: Medicare Other

## 2018-05-31 DIAGNOSIS — Z888 Allergy status to other drugs, medicaments and biological substances status: Secondary | ICD-10-CM

## 2018-05-31 DIAGNOSIS — Z825 Family history of asthma and other chronic lower respiratory diseases: Secondary | ICD-10-CM

## 2018-05-31 DIAGNOSIS — I130 Hypertensive heart and chronic kidney disease with heart failure and stage 1 through stage 4 chronic kidney disease, or unspecified chronic kidney disease: Secondary | ICD-10-CM | POA: Diagnosis present

## 2018-05-31 DIAGNOSIS — M109 Gout, unspecified: Secondary | ICD-10-CM | POA: Diagnosis present

## 2018-05-31 DIAGNOSIS — J9621 Acute and chronic respiratory failure with hypoxia: Secondary | ICD-10-CM | POA: Diagnosis present

## 2018-05-31 DIAGNOSIS — R911 Solitary pulmonary nodule: Secondary | ICD-10-CM | POA: Diagnosis not present

## 2018-05-31 DIAGNOSIS — Z794 Long term (current) use of insulin: Secondary | ICD-10-CM

## 2018-05-31 DIAGNOSIS — E1165 Type 2 diabetes mellitus with hyperglycemia: Secondary | ICD-10-CM | POA: Diagnosis present

## 2018-05-31 DIAGNOSIS — J181 Lobar pneumonia, unspecified organism: Principal | ICD-10-CM | POA: Diagnosis present

## 2018-05-31 DIAGNOSIS — J440 Chronic obstructive pulmonary disease with acute lower respiratory infection: Secondary | ICD-10-CM | POA: Diagnosis present

## 2018-05-31 DIAGNOSIS — I5030 Unspecified diastolic (congestive) heart failure: Secondary | ICD-10-CM | POA: Diagnosis not present

## 2018-05-31 DIAGNOSIS — Z807 Family history of other malignant neoplasms of lymphoid, hematopoietic and related tissues: Secondary | ICD-10-CM | POA: Diagnosis not present

## 2018-05-31 DIAGNOSIS — Z87820 Personal history of traumatic brain injury: Secondary | ICD-10-CM

## 2018-05-31 DIAGNOSIS — Z882 Allergy status to sulfonamides status: Secondary | ICD-10-CM

## 2018-05-31 DIAGNOSIS — Z885 Allergy status to narcotic agent status: Secondary | ICD-10-CM

## 2018-05-31 DIAGNOSIS — Z96643 Presence of artificial hip joint, bilateral: Secondary | ICD-10-CM | POA: Diagnosis present

## 2018-05-31 DIAGNOSIS — Z8701 Personal history of pneumonia (recurrent): Secondary | ICD-10-CM

## 2018-05-31 DIAGNOSIS — Z806 Family history of leukemia: Secondary | ICD-10-CM

## 2018-05-31 DIAGNOSIS — Z800 Family history of malignant neoplasm of digestive organs: Secondary | ICD-10-CM | POA: Diagnosis not present

## 2018-05-31 DIAGNOSIS — E1122 Type 2 diabetes mellitus with diabetic chronic kidney disease: Secondary | ICD-10-CM | POA: Diagnosis not present

## 2018-05-31 DIAGNOSIS — Z23 Encounter for immunization: Secondary | ICD-10-CM | POA: Diagnosis not present

## 2018-05-31 DIAGNOSIS — R0602 Shortness of breath: Secondary | ICD-10-CM | POA: Diagnosis present

## 2018-05-31 DIAGNOSIS — E785 Hyperlipidemia, unspecified: Secondary | ICD-10-CM | POA: Diagnosis not present

## 2018-05-31 DIAGNOSIS — Z803 Family history of malignant neoplasm of breast: Secondary | ICD-10-CM

## 2018-05-31 DIAGNOSIS — Z8709 Personal history of other diseases of the respiratory system: Secondary | ICD-10-CM

## 2018-05-31 DIAGNOSIS — F329 Major depressive disorder, single episode, unspecified: Secondary | ICD-10-CM | POA: Diagnosis not present

## 2018-05-31 DIAGNOSIS — R531 Weakness: Secondary | ICD-10-CM | POA: Diagnosis not present

## 2018-05-31 DIAGNOSIS — T380X5A Adverse effect of glucocorticoids and synthetic analogues, initial encounter: Secondary | ICD-10-CM | POA: Diagnosis present

## 2018-05-31 DIAGNOSIS — N184 Chronic kidney disease, stage 4 (severe): Secondary | ICD-10-CM | POA: Diagnosis not present

## 2018-05-31 DIAGNOSIS — Z87440 Personal history of urinary (tract) infections: Secondary | ICD-10-CM

## 2018-05-31 DIAGNOSIS — F419 Anxiety disorder, unspecified: Secondary | ICD-10-CM | POA: Diagnosis present

## 2018-05-31 DIAGNOSIS — Z820 Family history of epilepsy and other diseases of the nervous system: Secondary | ICD-10-CM | POA: Diagnosis not present

## 2018-05-31 DIAGNOSIS — Z66 Do not resuscitate: Secondary | ICD-10-CM | POA: Diagnosis present

## 2018-05-31 DIAGNOSIS — Z8249 Family history of ischemic heart disease and other diseases of the circulatory system: Secondary | ICD-10-CM | POA: Diagnosis not present

## 2018-05-31 DIAGNOSIS — J189 Pneumonia, unspecified organism: Secondary | ICD-10-CM

## 2018-05-31 DIAGNOSIS — Z87891 Personal history of nicotine dependence: Secondary | ICD-10-CM | POA: Diagnosis not present

## 2018-05-31 DIAGNOSIS — Z833 Family history of diabetes mellitus: Secondary | ICD-10-CM

## 2018-05-31 DIAGNOSIS — Z90710 Acquired absence of both cervix and uterus: Secondary | ICD-10-CM

## 2018-05-31 LAB — COMPREHENSIVE METABOLIC PANEL
ALT: 23 U/L (ref 0–44)
ANION GAP: 10 (ref 5–15)
AST: 32 U/L (ref 15–41)
Albumin: 2.5 g/dL — ABNORMAL LOW (ref 3.5–5.0)
Alkaline Phosphatase: 54 U/L (ref 38–126)
BUN: 36 mg/dL — ABNORMAL HIGH (ref 8–23)
CHLORIDE: 103 mmol/L (ref 98–111)
CO2: 26 mmol/L (ref 22–32)
Calcium: 9.2 mg/dL (ref 8.9–10.3)
Creatinine, Ser: 2.18 mg/dL — ABNORMAL HIGH (ref 0.44–1.00)
GFR calc non Af Amer: 20 mL/min — ABNORMAL LOW (ref >60)
GFR, EST AFRICAN AMERICAN: 23 mL/min — AB (ref >60)
Glucose, Bld: 123 mg/dL — ABNORMAL HIGH (ref 70–99)
POTASSIUM: 4.2 mmol/L (ref 3.5–5.1)
SODIUM: 139 mmol/L (ref 135–145)
Total Bilirubin: 0.7 mg/dL (ref 0.3–1.2)
Total Protein: 6.3 g/dL — ABNORMAL LOW (ref 6.5–8.1)

## 2018-05-31 LAB — RESPIRATORY PANEL BY PCR
Adenovirus: NOT DETECTED
Bordetella pertussis: NOT DETECTED
CORONAVIRUS 229E-RVPPCR: NOT DETECTED
CORONAVIRUS HKU1-RVPPCR: NOT DETECTED
CORONAVIRUS OC43-RVPPCR: NOT DETECTED
Chlamydophila pneumoniae: NOT DETECTED
Coronavirus NL63: NOT DETECTED
Influenza A: NOT DETECTED
Influenza B: NOT DETECTED
METAPNEUMOVIRUS-RVPPCR: NOT DETECTED
MYCOPLASMA PNEUMONIAE-RVPPCR: NOT DETECTED
PARAINFLUENZA VIRUS 1-RVPPCR: NOT DETECTED
PARAINFLUENZA VIRUS 2-RVPPCR: NOT DETECTED
Parainfluenza Virus 3: NOT DETECTED
Parainfluenza Virus 4: NOT DETECTED
Respiratory Syncytial Virus: NOT DETECTED
Rhinovirus / Enterovirus: NOT DETECTED

## 2018-05-31 LAB — CBC WITH DIFFERENTIAL/PLATELET
Abs Immature Granulocytes: 0.14 10*3/uL — ABNORMAL HIGH (ref 0.00–0.07)
BASOS ABS: 0 10*3/uL (ref 0.0–0.1)
Basophils Relative: 0 %
EOS ABS: 0.3 10*3/uL (ref 0.0–0.5)
EOS PCT: 3 %
HCT: 32.3 % — ABNORMAL LOW (ref 36.0–46.0)
Hemoglobin: 10.4 g/dL — ABNORMAL LOW (ref 12.0–15.0)
Immature Granulocytes: 1 %
Lymphocytes Relative: 36 %
Lymphs Abs: 3.8 10*3/uL (ref 0.7–4.0)
MCH: 30.1 pg (ref 26.0–34.0)
MCHC: 32.2 g/dL (ref 30.0–36.0)
MCV: 93.4 fL (ref 80.0–100.0)
MONO ABS: 1.1 10*3/uL — AB (ref 0.1–1.0)
Monocytes Relative: 11 %
Neutro Abs: 5.1 10*3/uL (ref 1.7–7.7)
Neutrophils Relative %: 49 %
PLATELETS: 423 10*3/uL — AB (ref 150–400)
RBC: 3.46 MIL/uL — AB (ref 3.87–5.11)
RDW: 13.1 % (ref 11.5–15.5)
WBC: 10.5 10*3/uL (ref 4.0–10.5)
nRBC: 0 % (ref 0.0–0.2)

## 2018-05-31 LAB — URINALYSIS, ROUTINE W REFLEX MICROSCOPIC
Bilirubin Urine: NEGATIVE
Glucose, UA: NEGATIVE mg/dL
Hgb urine dipstick: NEGATIVE
Ketones, ur: NEGATIVE mg/dL
LEUKOCYTES UA: NEGATIVE
NITRITE: NEGATIVE
PROTEIN: NEGATIVE mg/dL
Specific Gravity, Urine: 1.01 (ref 1.005–1.030)
pH: 6 (ref 5.0–8.0)

## 2018-05-31 LAB — I-STAT TROPONIN, ED: Troponin i, poc: 0.05 ng/mL (ref 0.00–0.08)

## 2018-05-31 LAB — I-STAT CG4 LACTIC ACID, ED: LACTIC ACID, VENOUS: 1.03 mmol/L (ref 0.5–1.9)

## 2018-05-31 LAB — BRAIN NATRIURETIC PEPTIDE: B Natriuretic Peptide: 239.3 pg/mL — ABNORMAL HIGH (ref 0.0–100.0)

## 2018-05-31 MED ORDER — METHYLPREDNISOLONE SODIUM SUCC 40 MG IJ SOLR
40.0000 mg | Freq: Four times a day (QID) | INTRAMUSCULAR | Status: DC
  Administered 2018-05-31 – 2018-06-01 (×3): 40 mg via INTRAVENOUS
  Filled 2018-05-31 (×4): qty 1

## 2018-05-31 MED ORDER — ALBUTEROL SULFATE (2.5 MG/3ML) 0.083% IN NEBU
2.5000 mg | INHALATION_SOLUTION | RESPIRATORY_TRACT | Status: DC | PRN
  Filled 2018-05-31: qty 3

## 2018-05-31 MED ORDER — SODIUM CHLORIDE 0.9 % IV SOLN: Freq: Once | INTRAVENOUS | Status: DC

## 2018-05-31 MED ORDER — SODIUM CHLORIDE 0.9 % IV SOLN: 250.0000 mL | INTRAVENOUS | Status: DC | PRN

## 2018-05-31 MED ORDER — ACETAMINOPHEN 325 MG PO TABS: 650.0000 mg | ORAL_TABLET | Freq: Four times a day (QID) | ORAL | Status: DC | PRN

## 2018-05-31 MED ORDER — UMECLIDINIUM BROMIDE 62.5 MCG/INH IN AEPB
1.0000 | INHALATION_SPRAY | Freq: Every day | RESPIRATORY_TRACT | Status: DC
  Administered 2018-06-01 – 2018-06-02 (×2): 1 via RESPIRATORY_TRACT
  Filled 2018-05-31: qty 7

## 2018-05-31 MED ORDER — BENZONATATE 100 MG PO CAPS: 100.0000 mg | ORAL_CAPSULE | Freq: Three times a day (TID) | ORAL | Status: DC | PRN

## 2018-05-31 MED ORDER — COLCHICINE 0.6 MG PO TABS: 0.6000 mg | ORAL_TABLET | Freq: Every day | ORAL | Status: DC | PRN

## 2018-05-31 MED ORDER — ONDANSETRON HCL 4 MG/2ML IJ SOLN: 4.0000 mg | Freq: Four times a day (QID) | INTRAMUSCULAR | Status: DC | PRN

## 2018-05-31 MED ORDER — ACETAMINOPHEN 650 MG RE SUPP: 650.0000 mg | Freq: Four times a day (QID) | RECTAL | Status: DC | PRN

## 2018-05-31 MED ORDER — TIOTROPIUM BROMIDE MONOHYDRATE 18 MCG IN CAPS
18.0000 ug | ORAL_CAPSULE | Freq: Every day | RESPIRATORY_TRACT | Status: DC
  Filled 2018-05-31: qty 5

## 2018-05-31 MED ORDER — SODIUM CHLORIDE 0.9 % IV BOLUS
1000.0000 mL | Freq: Once | INTRAVENOUS | Status: AC
  Administered 2018-05-31: 1000 mL via INTRAVENOUS

## 2018-05-31 MED ORDER — MIDODRINE HCL 5 MG PO TABS
5.0000 mg | ORAL_TABLET | Freq: Three times a day (TID) | ORAL | Status: DC
  Administered 2018-05-31 – 2018-06-01 (×2): 5 mg via ORAL
  Filled 2018-05-31 (×2): qty 1

## 2018-05-31 MED ORDER — ALPRAZOLAM 0.25 MG PO TABS: 0.2500 mg | ORAL_TABLET | Freq: Two times a day (BID) | ORAL | Status: DC | PRN

## 2018-05-31 MED ORDER — AZITHROMYCIN 250 MG PO TABS
500.0000 mg | ORAL_TABLET | ORAL | Status: DC
  Administered 2018-05-31 – 2018-06-02 (×3): 500 mg via ORAL
  Filled 2018-05-31 (×3): qty 2

## 2018-05-31 MED ORDER — ALBUTEROL SULFATE (2.5 MG/3ML) 0.083% IN NEBU
2.5000 mg | INHALATION_SOLUTION | Freq: Four times a day (QID) | RESPIRATORY_TRACT | Status: DC
  Administered 2018-05-31 – 2018-06-01 (×3): 2.5 mg via RESPIRATORY_TRACT
  Filled 2018-05-31 (×3): qty 3

## 2018-05-31 MED ORDER — IPRATROPIUM BROMIDE 0.02 % IN SOLN
0.5000 mg | Freq: Four times a day (QID) | RESPIRATORY_TRACT | Status: DC
  Administered 2018-05-31 – 2018-06-01 (×3): 0.5 mg via RESPIRATORY_TRACT
  Filled 2018-05-31 (×3): qty 2.5

## 2018-05-31 MED ORDER — ESCITALOPRAM OXALATE 20 MG PO TABS
20.0000 mg | ORAL_TABLET | Freq: Every day | ORAL | Status: DC
  Administered 2018-05-31 – 2018-06-02 (×3): 20 mg via ORAL
  Filled 2018-05-31 (×3): qty 1

## 2018-05-31 MED ORDER — INFLUENZA VAC SPLIT HIGH-DOSE 0.5 ML IM SUSY
0.5000 mL | PREFILLED_SYRINGE | INTRAMUSCULAR | Status: AC
  Administered 2018-06-01: 0.5 mL via INTRAMUSCULAR
  Filled 2018-05-31: qty 0.5

## 2018-05-31 MED ORDER — SODIUM CHLORIDE 0.9% FLUSH: 3.0000 mL | INTRAVENOUS | Status: DC | PRN

## 2018-05-31 MED ORDER — SODIUM CHLORIDE 0.9 % IV SOLN
1.0000 g | INTRAVENOUS | Status: DC
  Administered 2018-05-31 – 2018-06-01 (×2): 1 g via INTRAVENOUS
  Filled 2018-05-31 (×2): qty 10

## 2018-05-31 MED ORDER — ONDANSETRON HCL 4 MG PO TABS: 4.0000 mg | ORAL_TABLET | Freq: Four times a day (QID) | ORAL | Status: DC | PRN

## 2018-05-31 MED ORDER — ALBUTEROL SULFATE (2.5 MG/3ML) 0.083% IN NEBU: 5.0000 mg | INHALATION_SOLUTION | Freq: Once | RESPIRATORY_TRACT | Status: DC

## 2018-05-31 MED ORDER — ENOXAPARIN SODIUM 30 MG/0.3ML ~~LOC~~ SOLN
30.0000 mg | SUBCUTANEOUS | Status: DC
  Administered 2018-05-31 – 2018-06-02 (×3): 30 mg via SUBCUTANEOUS
  Filled 2018-05-31 (×3): qty 0.3

## 2018-05-31 MED ORDER — IPRATROPIUM-ALBUTEROL 0.5-2.5 (3) MG/3ML IN SOLN
3.0000 mL | Freq: Once | RESPIRATORY_TRACT | Status: AC
  Administered 2018-05-31: 3 mL via RESPIRATORY_TRACT
  Filled 2018-05-31: qty 3

## 2018-05-31 MED ORDER — FENOFIBRATE 54 MG PO TABS
54.0000 mg | ORAL_TABLET | Freq: Every day | ORAL | Status: DC
  Administered 2018-05-31 – 2018-06-01 (×2): 54 mg via ORAL
  Filled 2018-05-31 (×2): qty 1

## 2018-05-31 MED ORDER — SODIUM CHLORIDE 0.9 % IV SOLN
500.0000 mg | Freq: Once | INTRAVENOUS | Status: AC
  Administered 2018-05-31: 500 mg via INTRAVENOUS
  Filled 2018-05-31: qty 500

## 2018-05-31 MED ORDER — METHYLPREDNISOLONE SODIUM SUCC 125 MG IJ SOLR
125.0000 mg | Freq: Once | INTRAMUSCULAR | Status: AC
  Administered 2018-05-31: 125 mg via INTRAVENOUS
  Filled 2018-05-31: qty 2

## 2018-05-31 MED ORDER — SODIUM CHLORIDE 0.9% FLUSH
3.0000 mL | Freq: Two times a day (BID) | INTRAVENOUS | Status: DC
  Administered 2018-05-31 – 2018-06-02 (×4): 3 mL via INTRAVENOUS

## 2018-05-31 MED ORDER — SODIUM CHLORIDE 0.9 % IV SOLN
1.0000 g | Freq: Once | INTRAVENOUS | Status: AC
  Administered 2018-05-31: 1 g via INTRAVENOUS
  Filled 2018-05-31: qty 10

## 2018-05-31 NOTE — ED Notes (Signed)
Pt very weak upon returning from bathroom. Used steady to get patient back into bed. Pt unable to provide urine sample. Was in and out catheterized upon return to room. Specimens sent to lab.

## 2018-05-31 NOTE — ED Provider Notes (Signed)
[facility] EMERGENCY DEPARTMENT Provider Note   CSN: [id number] Arrival date & time: 05/31/18  6384     History   Chief Complaint Chief Complaint  Patient presents with  . Pneumonia    HPI Jill [name] is a 84 y.o. female with a history of COPD, hypertension, hyperlipidemia, CKD who presents emergency department today for shortness of breath and weakness.  Patient's son is at bedside helps provide history.  He reports that last week the patient started developing a dry cough with associated shortness of breath at rest it was worsened with exertion.  Patient reports that she has intermittent episodes of chest pain that is not able to further explain illness.  No current chest pain.  She was seen by her PCP on 10/7 where she was diagnosed with pneumonia in the left lower lobe per x-ray and was discharged home with levofloxacin taper.  She has been taking since prescribed without any improvement.  Patient lives at home by herself but has been declining per son and he is now staying with her.  She is now generally weak and has stopped eating or drinking.  He is afraid she may be dehydrated.  He notes that her shortness of breath and cough have not worsened.  He is bringing her in for further evaluation.  Denies any fever, chills, nausea/vomiting.  Patient does not wear home oxygen.  No hemoptysis or history of cancer.  HPI  Past Medical History:  Diagnosis Date  . Allergy   . Arthritis    osteoarthritis   . Chronic kidney disease   . Complication of anesthesia    difficult waking   . Depression   . Diabetes mellitus   . Diverticula, colon   . Family history of anesthesia complication    Son is difficult intubation  . Hyperlipidemia   . Hypertension   . Vasovagal syncope 2006   Negative cardiac workup-myoview, echo    Patient Active Problem List   Diagnosis Date Noted  . Pneumonia due to infectious organism 05/29/2018  . Pulmonary nodule 05/29/2018  .  Vision changes 03/07/2018  . Severe headache 09/26/2017  . COPD exacerbation ([place]) 07/18/2017  . Urinary urgency 02/03/2017  . Right arm pain 02/02/2017  . Bilateral buttock pain 02/02/2017  . Laceration of arm 02/02/2017  . Seborrheic keratoses, inflamed 01/13/2017  . Gout involving toe of right foot 12/23/2016  . [place] intertrigo 10/28/2016  . Stage 1 mild COPD by GOLD classification ([place]) 09/13/2016  . Intraparenchymal hematoma of brain ([place]) 08/08/2016  . Diabetes mellitus with complication ([place]) 66/59/9357  . Wrist injury 08/08/2016  . Chronic diastolic congestive heart failure ([place]) 08/08/2016  . Concussion with no loss of consciousness 08/08/2016  . Intracranial bleed ([place])   . BPPV (benign paroxysmal positional vertigo) 06/24/2016  . Memory loss 06/24/2016  . Accidental fall 06/24/2016  . Chronic neck pain 03/18/2016  . Autonomic neuropathy due to diabetes ([place]) 03/18/2016  . Anemia in chronic kidney disease 12/15/2015  . Incomplete bladder emptying 10/15/2015  . Abscess, renal/perirenal 10/15/2015  . Recurrent UTI 10/15/2015  . Weakness 06/02/2015  . Lumbar back pain with radiculopathy affecting left lower extremity 05/05/2015  . Renal abscess 01/28/2015  . Chronic fatigue 01/22/2015  . Chronic idiopathic urticaria 01/17/2015  . S/P hip replacement 05/16/2014  . DNR (do not resuscitate) 02/20/2014  . ALLERGIC RHINITIS 12/17/2008  . Vitamin D deficiency 07/01/2008  . CKD (chronic kidney disease) stage 4, GFR 15-29 ml/min ([facility]) 02/11/2008  .  DIVERTICULOSIS OF COLON 02/01/2008  . COLONIC POLYPS, ADENOMATOUS 10/19/2006  . Diabetes mellitus due to underlying condition with diabetic autonomic neuropathy ([place]) 10/19/2006  . Hyperlipidemia 10/19/2006  . OBESITY, NOS 10/19/2006  . Major depression, recurrent ([place]) 10/19/2006  . HYPERTENSION, BENIGN SYSTEMIC 10/19/2006  . Recurrent urticaria 10/19/2006    Past Surgical History:  Procedure Laterality Date  . ABDOMINAL  HYSTERECTOMY  1977   fibroid  . JOINT REPLACEMENT  2009   rt hip  . TOTAL HIP ARTHROPLASTY Left 05/12/2014   dr Mayer Camel  . TOTAL HIP ARTHROPLASTY Left 05/12/2014   Procedure: TOTAL HIP ARTHROPLASTY;  Surgeon: Kerin Salen, MD;  Location: [facility];  Service: Orthopedics;  Laterality: Left;     OB History   None      Home Medications    Prior to Admission medications   Medication Sig Start Date End Date Taking? Authorizing Provider  albuterol (PROVENTIL HFA;VENTOLIN HFA) 108 (90 Base) MCG/ACT inhaler Inhale 2 puffs into the lungs every 4 (four) hours as needed for wheezing or shortness of breath. 05/28/18   Tonia Ghent, MD  ALPRAZolam Duanne Moron) 0.25 MG tablet Take 1 tablet (0.25 mg total) by mouth 2 (two) times daily as needed for anxiety. 08/09/16   Geradine Girt, DO  benzonatate (TESSALON) 100 MG capsule Take 1 capsule (100 mg total) by mouth 3 (three) times daily as needed for cough. 05/29/18   Tonia Ghent, MD  BIOTIN PO Take 1 tablet by mouth daily.    [provider]  colchicine 0.6 MG tablet Take 1 tablet (0.6 mg total) by mouth daily as needed (for gout). Okay to fill with mitigare/colchicine/colcrys 05/28/18   Tonia Ghent, MD  diphenhydrAMINE (BENADRYL) 25 MG tablet Take 25 mg by mouth every 6 (six) hours as needed for itching.    [provider]  escitalopram (LEXAPRO) 20 MG tablet TAKE 1 TABLET BY MOUTH EVERY DAY 04/11/18   Bedsole, Amy E, MD  fenofibrate 54 MG tablet TAKE ONE TABLET(54mg ) BY MOUTH TWICE DAILY 01/08/18   Bedsole, Amy E, MD  HYDROcodone-homatropine (HYCODAN) 5-1.5 MG/5ML syrup Take 5 mLs by mouth at bedtime as needed for cough. 05/29/18   Bedsole, Amy E, MD  levofloxacin (LEVAQUIN) 250 MG tablet 3 tabs (750mg ) for 1 day, then 2 tabs (500mg ) every other day thereafter.  Total of 7 days of treatment. 05/28/18   Tonia Ghent, MD  midodrine (PROAMATINE) 5 MG tablet Take 5 mg by mouth 3 (three) times daily with meals.    [provider]  Multiple Vitamins-Minerals (MULTIVITAMIN WITH MINERALS) tablet Take 1 tablet by mouth daily.    [provider]  nystatin (MYCOSTATIN/NYSTOP) powder Apply topically 4 (four) times daily. 11/10/16   Bedsole, Amy E, MD  tiotropium (SPIRIVA HANDIHALER) 18 MCG inhalation capsule Place 1 capsule (18 mcg total) into inhaler and inhale daily. 05/28/18   Tonia Ghent, MD  Vitamin D, Ergocalciferol, (DRISDOL) 50000 units CAPS capsule Take 50,000 Units by mouth every 7 (seven) days.    [provider]    Family History Family History  Problem Relation Age of Onset  . COPD Brother   . Heart disease Brother   . Diabetes Brother   . Lymphoma Brother   . Alzheimer's disease Brother   . Pancreatic cancer Sister   . Alzheimer's disease Sister   . Heart disease Mother   . Breast cancer Sister   . Leukemia Other   . Kidney disease Neg Hx  Social History Social History   Tobacco Use  . Smoking status: Former Smoker    Packs/day: 1.00    Years: 45.00    Pack years: 45.00    Types: Cigarettes    Last attempt to quit: 06/16/1999    Years since quitting: 18.9  . Smokeless tobacco: Never Used  . Tobacco comment: quit 15 years ago  Substance Use Topics  . Alcohol use: No    Alcohol/week: 0.0 standard drinks  . Drug use: No     Allergies   Lovastatin; Statins; Sulfa antibiotics; Codeine; and Niacin   Review of Systems Review of Systems  All other systems reviewed and are negative.    Physical Exam Updated Vital Signs BP (!) 126/52 (BP Location: Left Arm)   Pulse 68   Temp 97.6 F (36.4 C) (Oral)   Resp 16   SpO2 94%   Physical Exam  Constitutional: She appears well-developed and well-nourished.  Frail appearing elderly female  HENT:  Head: Normocephalic and atraumatic.  Right Ear: External ear normal.  Left Ear: External ear normal.  Nose: Nose normal.  Mouth/Throat: Uvula is midline and oropharynx is clear and moist. Mucous membranes are dry. No  tonsillar exudate.  Eyes: Pupils are equal, round, and reactive to light. Right eye exhibits no discharge. Left eye exhibits no discharge. No scleral icterus.  Neck: Trachea normal. Neck supple. No spinous process tenderness present. No neck rigidity. Normal range of motion present.  Cardiovascular: Normal rate, regular rhythm and intact distal pulses.  No murmur heard. Pulses:      Radial pulses are 2+ on the right side, and 2+ on the left side.       Dorsalis pedis pulses are 2+ on the right side, and 2+ on the left side.       Posterior tibial pulses are 2+ on the right side, and 2+ on the left side.  No lower extremity swelling or edema. Calves symmetric in size bilaterally.  Pulmonary/Chest: Effort normal. No respiratory distress. She has rales (LLL). She exhibits no tenderness.  Abdominal: Soft. Bowel sounds are normal. There is no tenderness. There is no rebound and no guarding.  Musculoskeletal: She exhibits no edema.  Lymphadenopathy:    She has no cervical adenopathy.  Neurological: She is alert.  Skin: Skin is warm and dry. No rash noted. She is not diaphoretic.  Psychiatric: She has a normal mood and affect.  Nursing note and vitals reviewed.    ED Treatments / Results  Labs (all labs ordered are listed, but only abnormal results are displayed) Labs Reviewed  CBC WITH DIFFERENTIAL/PLATELET - Abnormal; Notable for the following components:      Result Value   RBC 3.46 (*)    Hemoglobin 10.4 (*)    HCT 32.3 (*)    Platelets 423 (*)    Monocytes Absolute 1.1 (*)    Abs Immature Granulocytes 0.14 (*)    All other components within normal limits  COMPREHENSIVE METABOLIC PANEL - Abnormal; Notable for the following components:   Glucose, Bld 123 (*)    BUN 36 (*)    Creatinine, Ser 2.18 (*)    Total Protein 6.3 (*)    Albumin 2.5 (*)    GFR calc non Af Amer 20 (*)    GFR calc Af Amer 23 (*)    All other components within normal limits  BRAIN NATRIURETIC PEPTIDE -  Abnormal; Notable for the following components:   B Natriuretic Peptide 239.3 (*)  All other components within normal limits  CULTURE, BLOOD (ROUTINE X 2)  CULTURE, BLOOD (ROUTINE X 2)  URINE CULTURE  RESPIRATORY PANEL BY PCR  URINALYSIS, ROUTINE W REFLEX MICROSCOPIC  I-STAT CG4 LACTIC ACID, ED  I-STAT TROPONIN, ED  I-STAT CG4 LACTIC ACID, ED    EKG None  Radiology Dg Chest 2 View  Result Date: 05/31/2018 CLINICAL DATA:  History of pneumonia, persistent cough and fatigue, follow-up EXAM: CHEST - 2 VIEW COMPARISON:  Portable chest x-ray of 05/28/2018 FINDINGS: Small bilateral pleural effusions rim remain with mild atelectasis at the right lung base. No definite pneumonia is seen although there may be very mild pulmonary vascular congestion present. The previously noted small nodules at the right lung base and possibly in the left upper lobe are less well seen. Mild cardiomegaly is stable. IMPRESSION: 1. Probable mild pulmonary vascular congestion now present with small pleural effusions and mild basilar atelectasis. 2. No definite pneumonia. 3. Small nodules questioned previously may still be present but less well seen. Recommend continued follow-up with possible CT of the chest if warranted clinically. Electronically Signed   By: Ivar Drape M.D.   On: 05/31/2018 10:54   Ct Chest Wo Contrast  Result Date: 05/31/2018 CLINICAL DATA:  Cough, cold, fever. EXAM: CT CHEST WITHOUT CONTRAST TECHNIQUE: Multidetector CT imaging of the chest was performed following the standard protocol without IV contrast. COMPARISON:  Chest x-ray 05/31/2018 FINDINGS: Cardiovascular: Coronary artery and scattered aortic calcifications. Heart is normal size. Small pericardial effusion. Mediastinum/Nodes: Mildly enlarged mediastinal and likely right hilar lymph nodes although the right hilar lymph nodes are difficult to visualize and measure due to lack of intravenous contrast. Low right paratracheal lymph node has a  short axis diameter of 11 mm. Other similarly sized mediastinal lymph nodes. No axillary adenopathy. Lungs/Pleura: There are nodular ground-glass airspace opacities throughout both lungs. This is most pronounced in the upper lobes. Solid right lower lobe pulmonary nodule on image 103 measures 5 mm. Similarly sized left lower lobe nodule on image 92. Other smaller scattered solid nodules in the lower lungs. Trace bilateral effusions. Upper Abdomen: Imaging into the upper abdomen shows no acute findings. Musculoskeletal: Chest wall soft tissues are unremarkable. No acute bony abnormality. IMPRESSION: Extensive nodular ground-glass airspace disease throughout both lungs, most pronounced in the upper lobes. Favor infectious/inflammatory process. There are scattered bilateral solid pulmonary nodules, 5 mm or less in size. These may also be inflammatory, but recommend attention on follow-up imaging. Trace bilateral pleural effusions and small pericardial effusion. Scattered coronary artery and aortic calcifications. Recommend follow-up chest CT in 6 months after treatment to ensure improvement or stability in pulmonary nodules. Electronically Signed   By: Rolm Baptise M.D.   On: 05/31/2018 12:47    Procedures Procedures (including critical care time)  Medications Ordered in ED Medications  cefTRIAXone (ROCEPHIN) 1 g in sodium chloride 0.9 % 100 mL IVPB (has no administration in time range)  azithromycin (ZITHROMAX) 500 mg in sodium chloride 0.9 % 250 mL IVPB (has no administration in time range)  sodium chloride 0.9 % bolus 1,000 mL (0 mLs Intravenous Stopped 05/31/18 1141)     Initial Impression / Assessment and Plan / ED Course  I have reviewed the triage vital signs and the nursing notes.  Pertinent labs & imaging results that were available during my care of the patient were reviewed by me and considered in my medical decision making (see chart for details).     84 year old female with a history  of COPD, hypertension, hyperlipidemia, CKD who presents the emergent department today with worsening shortness of breath and weakness.  Patient is here with his son who helps provide history.  Patient started developing a dry cough and shortness of breath is worse with exertion last week.  She was seen by her PCP on 10/7 and was diagnosed with pneumonia and discharged home on levofloxacin.  Since that time patient has stopped eating and drinking and has generalized weakness.  She normally lives at home by herself but has since then to start staying with her son.  On arrival patient is without fever, tachycardia, tachypnea, hypoxia or hypotension.  Patient is frail in appearance mucous membranes are dry.  Lungs are with some crackles to the bases.  Fluids given.  Blood cultures ordered.  Lactic acid within normal limits.  Patient does not meet sepsis criteria.  EKG normal sinus rhythm.  Troponin within normal limits.  UA without evidence UTI.  CBC without leukocytosis.  No significant electrolyte derangements.  Chest x-ray without definitive evidence of pneumonia.  Recommended CT for further evaluation.  Patient's kidney function is allow for contrast.  Will get CT without contrast.  CT shows groundglass airspace disease favoring infectious process.  Patient has trace bilateral pleural effusions and a small pericardial effusion as well.  There is also noted pulmonary nodules that require follow-up imaging after discharge in 6 months (pulmonary nodule added to problem list). Will treat with ceftriaxone and azithromycin.  Will consult for admission.  Family in agreement.  Case discussed with my attending, Dr. Vallery Ridge who is in agreement.  Hospitalist to admit. Requested Duo-neb and steroids. Patients vitals are stable. Appears safe for admission.   Final Clinical Impressions(s) / ED Diagnoses   Final diagnoses:  Community acquired pneumonia of left upper lobe of lung ([place])  Shortness of breath  Generalized  weakness  Pulmonary nodule  History of COPD    ED Discharge Orders    None       Jillyn Ledger, PA-C 05/31/18 1459    Charlesetta Shanks, MD 05/31/18 1504

## 2018-05-31 NOTE — ED Provider Notes (Signed)
Medical screening examination/treatment/procedure(s) were conducted as a shared visit with non-physician practitioner(s) and myself.  I personally evaluated the patient during the encounter.  None Patient with comorbid illness of COPD who has had ongoing shortness of breath and generalized weakness.  Patient has been treated with Levaquin for 3 days.  She had been diagnosed with pneumonia.  She has not had any improvement.  Patient is alert and interactive.  She does not have respiratory distress at rest.  CT shows diffuse airspace disease.  Patient has bilateral pleural effusions.  With worsening condition despite outpatient treatment, plan will be for admission.    Charlesetta Shanks, MD 05/31/18 [phone]

## 2018-05-31 NOTE — H&P (Addendum)
[facility]                                                                                    Patient Demographics  Jill [name], is a 84 y.o. female  CSN: [id number]  MRN: [id number]  DOB - [birth year]  Admit Date - 05/31/2018  Outpatient Primary MD for the patient is Jinny Sanders, MD   With History of -  Past Medical History:  Diagnosis Date  . Allergy   . Arthritis    osteoarthritis   . Chronic kidney disease   . Complication of anesthesia    difficult waking   . Depression   . Diabetes mellitus   . Diverticula, colon   . Family history of anesthesia complication    Son is difficult intubation  . Hyperlipidemia   . Hypertension   . Vasovagal syncope 2006   Negative cardiac workup-myoview, echo      Past Surgical History:  Procedure Laterality Date  . ABDOMINAL HYSTERECTOMY  1977   fibroid  . JOINT REPLACEMENT  2009   rt hip  . TOTAL HIP ARTHROPLASTY Left 05/12/2014   dr Mayer Camel  . TOTAL HIP ARTHROPLASTY Left 05/12/2014   Procedure: TOTAL HIP ARTHROPLASTY;  Surgeon: Kerin Salen, MD;  Location: [place];  Service: Orthopedics;  Laterality: Left;    in for   Chief Complaint  Patient presents with  . Pneumonia     HPI  Bayler Nehring  is a 84 y.o. female, with past medical history significant for COPD, hypertension chronic kidney disease and hyperlipidemia presenting with 1 week history of shortness of breath and cough.  The patient was seen by her primary medical doctor and she was started on Levaquin on Monday.  There was no significant improvement.  Patient denies any significant chest pains but reports increased coughing episodes.  Son at bedside.  Patient had multiple admissions for pneumonia and she said that she is getting tired of this.  Patient denies any fever or chills in the last 2 days and her temperature in the emergency room was 97 6.  Her chest x-ray showed no definite pneumonia but CT of the chest showed extensive groundglass  opacities in the upper lobes.  No history of nausea or vomiting.  Patient lives at home.    Review of Systems    In addition to the HPI above,  No Fever-chills, No Headache, No changes with Vision or hearing, No problems swallowing food or Liquids, No Abdominal pain, No Nausea or Vommitting, Bowel movements are regular, No Blood in stool or Urine, No dysuria, No new skin rashes or bruises, No new joints pains-aches,  No new weakness, tingling, numbness in any extremity, No recent weight gain or loss, No polyuria, polydypsia or polyphagia, No significant Mental Stressors.  A full 10 point Review of Systems was done, except as stated above, all other Review of Systems were negative.   Social History Social History   Tobacco Use  . Smoking status: Former Smoker    Packs/day: 1.00    Years: 45.00    Pack years: 45.00    Types: Cigarettes    Last attempt to quit:  06/16/1999    Years since quitting: 18.9  . Smokeless tobacco: Never Used  . Tobacco comment: quit 15 years ago  Substance Use Topics  . Alcohol use: No    Alcohol/week: 0.0 standard drinks     Family History Family History  Problem Relation Age of Onset  . COPD Brother   . Heart disease Brother   . Diabetes Brother   . Lymphoma Brother   . Alzheimer's disease Brother   . Pancreatic cancer Sister   . Alzheimer's disease Sister   . Heart disease Mother   . Breast cancer Sister   . Leukemia Other   . Kidney disease Neg Hx      Prior to Admission medications   Medication Sig Start Date End Date Taking? Authorizing Provider  albuterol (PROVENTIL HFA;VENTOLIN HFA) 108 (90 Base) MCG/ACT inhaler Inhale 2 puffs into the lungs every 4 (four) hours as needed for wheezing or shortness of breath. 05/28/18  Yes Tonia Ghent, MD  ALPRAZolam Duanne Moron) 0.25 MG tablet Take 1 tablet (0.25 mg total) by mouth 2 (two) times daily as needed for anxiety. 08/09/16  Yes Vann, Jessica U, DO  benzonatate (TESSALON) 100 MG  capsule Take 1 capsule (100 mg total) by mouth 3 (three) times daily as needed for cough. 05/29/18  Yes Tonia Ghent, MD  BIOTIN PO Take 1 tablet by mouth daily.   Yes [provider]  colchicine 0.6 MG tablet Take 1 tablet (0.6 mg total) by mouth daily as needed (for gout). Okay to fill with mitigare/colchicine/colcrys 05/28/18  Yes Tonia Ghent, MD  diphenhydrAMINE (BENADRYL) 25 MG tablet Take 25 mg by mouth every 6 (six) hours as needed for itching.   Yes [provider]  escitalopram (LEXAPRO) 20 MG tablet TAKE 1 TABLET BY MOUTH EVERY DAY 04/11/18  Yes Bedsole, Amy E, MD  fenofibrate 54 MG tablet TAKE ONE TABLET(54mg ) BY MOUTH TWICE DAILY 01/08/18  Yes Bedsole, Amy E, MD  levofloxacin (LEVAQUIN) 250 MG tablet 3 tabs (750mg ) for 1 day, then 2 tabs (500mg ) every other day thereafter.  Total of 7 days of treatment. Patient taking differently: Take 500-750 mg by mouth See admin instructions. Take 3 tabs (750mg ) for 1 day, then 2 tabs (500mg ) every other day thereafter until gone. 05/28/18  Yes Tonia Ghent, MD  midodrine (PROAMATINE) 5 MG tablet Take 5 mg by mouth 3 (three) times daily with meals.   Yes [provider]  Multiple Vitamins-Minerals (MULTIVITAMIN WITH MINERALS) tablet Take 1 tablet by mouth daily.   Yes [provider]  nystatin (MYCOSTATIN/NYSTOP) powder Apply topically 4 (four) times daily. 11/10/16  Yes Bedsole, Amy E, MD  tiotropium (SPIRIVA HANDIHALER) 18 MCG inhalation capsule Place 1 capsule (18 mcg total) into inhaler and inhale daily. 05/28/18  Yes Tonia Ghent, MD  Vitamin D, Ergocalciferol, (DRISDOL) 50000 units CAPS capsule Take 50,000 Units by mouth every Friday.    Yes [provider]  HYDROcodone-homatropine (HYCODAN) 5-1.5 MG/5ML syrup Take 5 mLs by mouth at bedtime as needed for cough. 05/29/18   Jinny Sanders, MD    Allergies  Allergen Reactions  . Lovastatin Other (See Comments)    REACTION: leg pain  . Statins  Other (See Comments)    REACTION: leg cramps, weakness  . Sulfa Antibiotics Hives and Itching  . Codeine Rash  . Niacin Rash    Flushing also    Physical Exam  Vitals  Blood pressure (!) 147/53, pulse 80, temperature 98.3  F (36.8 C), temperature source Oral, resp. rate 20, height 5' 2.5" (1.588 m), weight 72.2 kg, SpO2 91 %.   1. General well-developed, well-nourished, chronically ill  2.  Sad affect and insight, Not Suicidal or Homicidal, Awake Alert, Oriented X 3.  3. No F.N deficits, grossly, patient moving all extremities.  4. Ears and Eyes appear Normal, Conjunctivae clear, PERRLA. Moist Oral Mucosa.  5. Supple Neck, No JVD, No cervical lymphadenopathy appriciated, No Carotid Bruits.  6. Symmetrical Chest wall movement, scattered rhonchi especially in the anterior right chest.  7. RRR, No Gallops, Rubs or Murmurs, No Parasternal Heave.  8. Positive Bowel Sounds, Abdomen Soft, Non tender, No organomegaly appriciated,No rebound -guarding or rigidity.  9.  No Cyanosis, Normal Skin Turgor, No Skin Rash or Bruise.  10. Good muscle tone,  joints appear normal , no effusions, Normal ROM.    Data Review  CBC Recent Labs  Lab 05/31/18 0910  WBC 10.5  HGB 10.4*  HCT 32.3*  PLT 423*  MCV 93.4  MCH 30.1  MCHC 32.2  RDW 13.1  LYMPHSABS 3.8  MONOABS 1.1*  EOSABS 0.3  BASOSABS 0.0   ------------------------------------------------------------------------------------------------------------------  Chemistries  Recent Labs  Lab 05/31/18 0910  NA 139  K 4.2  CL 103  CO2 26  GLUCOSE 123*  BUN 36*  CREATININE 2.18*  CALCIUM 9.2  AST 32  ALT 23  ALKPHOS 54  BILITOT 0.7   ------------------------------------------------------------------------------------------------------------------ estimated creatinine clearance is 18.1 mL/min (A) (by C-G formula based on SCr of 2.18 mg/dL  (H)). ------------------------------------------------------------------------------------------------------------------ No results for input(s): TSH, T4TOTAL, T3FREE, THYROIDAB in the last 72 hours.  Invalid input(s): FREET3   Coagulation profile No results for input(s): INR, PROTIME in the last 168 hours. ------------------------------------------------------------------------------------------------------------------- No results for input(s): DDIMER in the last 72 hours. -------------------------------------------------------------------------------------------------------------------  Cardiac Enzymes No results for input(s): CKMB, TROPONINI, MYOGLOBIN in the last 168 hours.  Invalid input(s): CK ------------------------------------------------------------------------------------------------------------------ Invalid input(s): POCBNP   ---------------------------------------------------------------------------------------------------------------  Urinalysis    Component Value Date/Time   COLORURINE YELLOW 05/31/2018 0925   APPEARANCEUR CLEAR 05/31/2018 0925   APPEARANCEUR Cloudy (A) 01/19/2016 1338   LABSPEC 1.010 05/31/2018 0925   PHURINE 6.0 05/31/2018 0925   GLUCOSEU NEGATIVE 05/31/2018 0925   HGBUR NEGATIVE 05/31/2018 0925   BILIRUBINUR NEGATIVE 05/31/2018 0925   BILIRUBINUR neg 12/28/2017 1229   BILIRUBINUR Negative 01/19/2016 1338   KETONESUR NEGATIVE 05/31/2018 0925   PROTEINUR NEGATIVE 05/31/2018 0925   UROBILINOGEN 0.2 12/28/2017 1229   UROBILINOGEN 0.2 02/03/2015 1427   NITRITE NEGATIVE 05/31/2018 0925   LEUKOCYTESUR NEGATIVE 05/31/2018 0925   LEUKOCYTESUR 3+ (A) 01/19/2016 1338    ----------------------------------------------------------------------------------------------------------------   Imaging results:   Dg Chest 1 View  Result Date: 05/28/2018 CLINICAL DATA:  Cough, possible pneumonia EXAM: CHEST  1 VIEW COMPARISON:  Chest x-ray of 07/15/2017  FINDINGS: There is slight elevation of the left hemidiaphragm probably due to gaseous distention of the stomach. There are slightly prominent markings. A vague opacity peripherally at the right lung base could represent a nodule or scarring and comparison with follow-up chest x-ray is recommended to assess stability. Also, a vague nodule in the left upper lobe overlying the posterior left fifth rib cannot be excluded. This was not present on prior chest x-ray. In view of the patient's age follow-up chest x-ray after interval treatment is recommended. Mediastinal and hilar contours are unremarkable. The heart is mildly enlarged and stable. No acute bony abnormality is seen. IMPRESSION: 1. Somewhat prominent markings at the right  lung base may reflect developing pneumonia. Consider follow-up chest x-ray. 2. Vague nodular opacity at the right costophrenic angle with second vague nodular opacity in the left upper lobe. Recommend follow-up chest x-ray after treatment to assess further. Electronically Signed   By: Ivar Drape M.D.   On: 05/28/2018 13:21   Dg Chest 2 View  Result Date: 05/31/2018 CLINICAL DATA:  History of pneumonia, persistent cough and fatigue, follow-up EXAM: CHEST - 2 VIEW COMPARISON:  Portable chest x-ray of 05/28/2018 FINDINGS: Small bilateral pleural effusions rim remain with mild atelectasis at the right lung base. No definite pneumonia is seen although there may be very mild pulmonary vascular congestion present. The previously noted small nodules at the right lung base and possibly in the left upper lobe are less well seen. Mild cardiomegaly is stable. IMPRESSION: 1. Probable mild pulmonary vascular congestion now present with small pleural effusions and mild basilar atelectasis. 2. No definite pneumonia. 3. Small nodules questioned previously may still be present but less well seen. Recommend continued follow-up with possible CT of the chest if warranted clinically. Electronically Signed    By: Ivar Drape M.D.   On: 05/31/2018 10:54   Ct Chest Wo Contrast  Result Date: 05/31/2018 CLINICAL DATA:  Cough, cold, fever. EXAM: CT CHEST WITHOUT CONTRAST TECHNIQUE: Multidetector CT imaging of the chest was performed following the standard protocol without IV contrast. COMPARISON:  Chest x-ray 05/31/2018 FINDINGS: Cardiovascular: Coronary artery and scattered aortic calcifications. Heart is normal size. Small pericardial effusion. Mediastinum/Nodes: Mildly enlarged mediastinal and likely right hilar lymph nodes although the right hilar lymph nodes are difficult to visualize and measure due to lack of intravenous contrast. Low right paratracheal lymph node has a short axis diameter of 11 mm. Other similarly sized mediastinal lymph nodes. No axillary adenopathy. Lungs/Pleura: There are nodular ground-glass airspace opacities throughout both lungs. This is most pronounced in the upper lobes. Solid right lower lobe pulmonary nodule on image 103 measures 5 mm. Similarly sized left lower lobe nodule on image 92. Other smaller scattered solid nodules in the lower lungs. Trace bilateral effusions. Upper Abdomen: Imaging into the upper abdomen shows no acute findings. Musculoskeletal: Chest wall soft tissues are unremarkable. No acute bony abnormality. IMPRESSION: Extensive nodular ground-glass airspace disease throughout both lungs, most pronounced in the upper lobes. Favor infectious/inflammatory process. There are scattered bilateral solid pulmonary nodules, 5 mm or less in size. These may also be inflammatory, but recommend attention on follow-up imaging. Trace bilateral pleural effusions and small pericardial effusion. Scattered coronary artery and aortic calcifications. Recommend follow-up chest CT in 6 months after treatment to ensure improvement or stability in pulmonary nodules. Electronically Signed   By: Rolm Baptise M.D.   On: 05/31/2018 12:47    My personal review of EKG: Normal sinus rhythm at 63  bpm    Assessment & Plan  Bilateral extensive pneumonia continue with Rocephin and Zithromax       Nebulizer treatment      Tessalon Perles  COPD with abnormal CT findings of pulmonary nodules and nodular groundglass airspace disease throughout both lungs      Continue with steroids and DuoNeb's and IV antibiotics  Gout      Continue with colchicine  Chronic renal failure with creatinine of 2.18  Anxiety/depression     Continue with Lexapro and Xanax  Status post fall with concussion in the past and intraparenchymal brain hematoma      Stable  Diabetes mellitus     Continue with  insulin sliding scale  Hyperlipidemia    Patient is allergic to statin and niacin continue with fenofibrate  Hypertension     Controlled on no medications  Diastolic congestive heart failure     Last echocardiogram in 2017 with ejection fraction 55 to 60% and [place] diastolic congestive heart failure  History of recurrent UTI  History of osteoarthritis status post total hip arthroplasty in the past  History of gout on colchicine  History of perirenal and renal abscesses        DVT Prophylaxis Lovenox  AM Labs Ordered, also please review Full Orders    Code Status DNR  Disposition Plan: Undetermined  Time spent in minutes : 46 minutes  Condition GUARDED   @SIGNATURE @

## 2018-05-31 NOTE — Telephone Encounter (Signed)
Agree with ER.

## 2018-05-31 NOTE — Telephone Encounter (Signed)
Attempted to contact pt regarding symptoms; left message on voicemail [phone].

## 2018-05-31 NOTE — ED Triage Notes (Signed)
Pt here from home for eval of pneumonia, states she has been diagnosed a week ago, took antibiotics but is not getting better. Vitals stable with EMS. Pt reports generalized fatigue. 94% on room air. No cough.

## 2018-05-31 NOTE — Telephone Encounter (Signed)
I spoke with Jill [name] and pt is in ambulance now. FYI to Dr Diona Browner.

## 2018-05-31 NOTE — Telephone Encounter (Signed)
Contacted pt's daughter Estill Bamberg; she says that the pt has dark urine;  low grade fever 99.3; weak (has been in bed since 05/28/18); can't hardly eat or get out of bed hurt to breath (hurts in back and chest); recommendations made per nurse triage to include calling 911; she verbalizes understanding; will route to office for notification of this encounter; pt is normally seen by Dr Diona Browner, [place].    Reason for Disposition . [1] SEVERE weakness (i.e., unable to walk or barely able to walk, requires support) AND     [2] new onset or worsening  Answer Assessment - Initial Assessment Questions 1. DESCRIPTION: "Describe how you are feeling."     weak 2. SEVERITY: "How bad is it?"  "Can you stand and walk?"   - MILD - Feels weak or tired, but does not interfere with work, school or normal activities   - [place] to stand and walk; weakness interferes with work, school, or normal activities   - SEVERE - Unable to stand or walk     severe 3. ONSET:  "When did the weakness begin?"     Progressive; worsened 05/30/18 4. CAUSE: "What do you think is causing the weakness?"     Recently diagnosed with pneumonia; not eating or drinking 5. MEDICINES: "Have you recently started a new medicine or had a change in the amount of a medicine?"     no 6. OTHER SYMPTOMS: "Do you have any other symptoms?" (e.g., chest pain, fever, cough, SOB, vomiting, diarrhea, bleeding, other areas of pain)     Temperature 99.3; hurts to eat or talk; no appetite; nausea 7. PREGNANCY: "Is there any chance you are pregnant?" "When was your last menstrual period?"     no  Protocols used: WEAKNESS (GENERALIZED) AND FATIGUE-A-AH

## 2018-05-31 NOTE — ED Notes (Signed)
Attempted report 

## 2018-06-01 LAB — CBC
HCT: 31.3 % — ABNORMAL LOW (ref 36.0–46.0)
Hemoglobin: 10.1 g/dL — ABNORMAL LOW (ref 12.0–15.0)
MCH: 29.5 pg (ref 26.0–34.0)
MCHC: 32.3 g/dL (ref 30.0–36.0)
MCV: 91.5 fL (ref 80.0–100.0)
Platelets: 381 10*3/uL (ref 150–400)
RBC: 3.42 MIL/uL — ABNORMAL LOW (ref 3.87–5.11)
RDW: 13.2 % (ref 11.5–15.5)
WBC: 9.3 10*3/uL (ref 4.0–10.5)
nRBC: 0 % (ref 0.0–0.2)

## 2018-06-01 LAB — BASIC METABOLIC PANEL
Anion gap: 15 (ref 5–15)
BUN: 44 mg/dL — AB (ref 8–23)
CHLORIDE: 105 mmol/L (ref 98–111)
CO2: 19 mmol/L — AB (ref 22–32)
CREATININE: 1.89 mg/dL — AB (ref 0.44–1.00)
Calcium: 8.5 mg/dL — ABNORMAL LOW (ref 8.9–10.3)
GFR calc Af Amer: 27 mL/min — ABNORMAL LOW (ref >60)
GFR calc non Af Amer: 23 mL/min — ABNORMAL LOW (ref >60)
Glucose, Bld: 200 mg/dL — ABNORMAL HIGH (ref 70–99)
Potassium: 5 mmol/L (ref 3.5–5.1)
SODIUM: 139 mmol/L (ref 135–145)

## 2018-06-01 LAB — URINE CULTURE: Culture: NO GROWTH

## 2018-06-01 LAB — STREP PNEUMONIAE URINARY ANTIGEN: Strep Pneumo Urinary Antigen: NEGATIVE

## 2018-06-01 LAB — GLUCOSE, CAPILLARY
GLUCOSE-CAPILLARY: 206 mg/dL — AB (ref 70–99)
GLUCOSE-CAPILLARY: 354 mg/dL — AB (ref 70–99)
GLUCOSE-CAPILLARY: 207 mg/dL — AB (ref 70–99)
Glucose-Capillary: 130 mg/dL — ABNORMAL HIGH (ref 70–99)

## 2018-06-01 MED ORDER — INSULIN ASPART 100 UNIT/ML ~~LOC~~ SOLN
0.0000 [IU] | Freq: Three times a day (TID) | SUBCUTANEOUS | Status: DC
  Administered 2018-06-01: 3 [IU] via SUBCUTANEOUS
  Administered 2018-06-01: 9 [IU] via SUBCUTANEOUS
  Administered 2018-06-01 – 2018-06-02 (×2): 3 [IU] via SUBCUTANEOUS

## 2018-06-01 MED ORDER — PREDNISONE 20 MG PO TABS
40.0000 mg | ORAL_TABLET | Freq: Every day | ORAL | Status: DC
  Administered 2018-06-02: 40 mg via ORAL
  Filled 2018-06-01: qty 2

## 2018-06-01 MED ORDER — MIDODRINE HCL 5 MG PO TABS: 5.0000 mg | ORAL_TABLET | Freq: Three times a day (TID) | ORAL | Status: DC | PRN

## 2018-06-01 MED ORDER — INSULIN ASPART 100 UNIT/ML ~~LOC~~ SOLN: 0.0000 [IU] | Freq: Every day | SUBCUTANEOUS | Status: DC

## 2018-06-01 MED ORDER — IPRATROPIUM-ALBUTEROL 0.5-2.5 (3) MG/3ML IN SOLN
3.0000 mL | Freq: Four times a day (QID) | RESPIRATORY_TRACT | Status: DC
  Administered 2018-06-01 (×2): 3 mL via RESPIRATORY_TRACT
  Filled 2018-06-01 (×2): qty 3

## 2018-06-01 MED ORDER — IPRATROPIUM-ALBUTEROL 0.5-2.5 (3) MG/3ML IN SOLN
3.0000 mL | Freq: Two times a day (BID) | RESPIRATORY_TRACT | Status: DC
  Administered 2018-06-02: 3 mL via RESPIRATORY_TRACT
  Filled 2018-06-01: qty 3

## 2018-06-01 NOTE — Care Management Note (Signed)
Case Management Note  Patient Details  Name: Jill [name] MRN: [id number] Date of Birth: May 19, 1932  Subjective/Objective:    From home, presents with recent pna was on levaquin by PCP, now with sob, has copd, gout, chronic renal failure, anxiety/depression, dm, hld, htn, diastolic chf, uti, oa.   Await  Pt eval.              Action/Plan: NCM will follow for transition of care needs.   Expected Discharge Date:                  Expected Discharge Plan:  [place]  In-House Referral:     Discharge planning Services  CM Consult  Post Acute Care Choice:    Choice offered to:     DME Arranged:    DME Agency:     HH Arranged:    [place] Agency:     Status of Service:  In process, will continue to follow  If discussed at Long Length of Stay Meetings, dates discussed:    Additional Comments:  Zenon Mayo, RN 06/01/2018, 9:30 AM

## 2018-06-01 NOTE — Progress Notes (Signed)
RN heard pt talking from outside of pt room and went to check on pt. Pt stood up and said, "I'm leaving and getting out of here." When asked if she knew her name, pt stated "I don't know." Pt could not answer any orientation questions. RN and nurse tech helped pt back to bed and obtained vital signs and called charge RN to bedside. Obtained EKG and notified Dr. Posey Pronto of new confusion.

## 2018-06-01 NOTE — Progress Notes (Signed)
[facility] Progress Note  Patient: Jill [name] MLY:[id number]   PCP: Jinny Sanders, MD DOB: [birth year]   DOA: 05/31/2018   DOS: 06/01/2018   Date of Service: the patient was seen and examined on 06/01/2018  Brief hospital course: Pt. with PMH of depression, type II DM, HLD, HTN, COPD, CKD, HLD; admitted on 05/31/2018, presented with complaint of shortness of breath, was found to have acute on chronic hypoxic respiratory failure secondary to COPD exacerbation. Currently further plan is continue steroids and nebulizers.  Subjective: Breathing is better but not back to baseline.  Still talks in short sentences.  Occasional cough.  No nausea no vomiting.  Oral intake is improving.  Assessment and Plan: 1.  Acute on chronic hypoxic respiratory failure. Bilateral patchy nodular pneumonia. Presented with cough and shortness of breath. Chest x-ray as well as CT scan of the chest both are showing bilateral patchy pneumonia. Respiratory virus panel is negative for any active viral infection. Patient continues to have some bilateral expiratory wheezing. Oxygenation has improved. To talk in short sentences. Continue with current treatment with antibiotics. I will switch her IV Solu-Medrol to oral prednisone.  2.  Essential hypertension. Chronic kidney disease stage IV. Patient follows up with nephrology outpatient. Was recently started on Midodrin although her blood pressure is currently elevated. I will discontinue monitoring for now. Recommend to hold it on discharge as well.  3.  Depression and anxiety. Continue home regimen. There was some concern for confusion this morning but at the time of my evaluation patient is alert awake and oriented x3.  4.  Steroid-induced hyperglycemia. Patient does not have diabetes. Hopefully we can stop the sliding scale insulin tomorrow once the steroids are tapered.   Diet: Cardiac diet DVT Prophylaxis: subcutaneous Heparin  Advance goals  of care discussion: DNR/DNI  Family Communication: no family was present at bedside, at the time of interview.   Disposition:  Discharge to full code.  Consultants: none Procedures: none  Scheduled Meds: . azithromycin  500 mg Oral Q24H  . enoxaparin (LOVENOX) injection  30 mg Subcutaneous Q24H  . escitalopram  20 mg Oral Daily  . insulin aspart  0-5 Units Subcutaneous QHS  . insulin aspart  0-9 Units Subcutaneous TID WC  . ipratropium-albuterol  3 mL Nebulization Q6H  . [START ON 06/02/2018] predniSONE  40 mg Oral Q breakfast  . sodium chloride flush  3 mL Intravenous Q12H  . umeclidinium bromide  1 puff Inhalation Daily   Continuous Infusions: . sodium chloride    . cefTRIAXone (ROCEPHIN)  IV 1 g (06/01/18 1655)   PRN Meds: sodium chloride, acetaminophen **OR** acetaminophen, albuterol, benzonatate, midodrine, ondansetron **OR** ondansetron (ZOFRAN) IV, sodium chloride flush Antibiotics: Anti-infectives (From admission, onward)   Start     Dose/Rate Route Frequency Ordered Stop   05/31/18 1615  cefTRIAXone (ROCEPHIN) 1 g in sodium chloride 0.9 % 100 mL IVPB     1 g 200 mL/hr over 30 Minutes Intravenous Every 24 hours 05/31/18 1604 06/07/18 1614   05/31/18 1615  azithromycin (ZITHROMAX) tablet 500 mg     500 mg Oral Every 24 hours 05/31/18 1604 06/07/18 0959   05/31/18 1315  cefTRIAXone (ROCEPHIN) 1 g in sodium chloride 0.9 % 100 mL IVPB     1 g 200 mL/hr over 30 Minutes Intravenous  Once 05/31/18 1307 05/31/18 1400   05/31/18 1315  azithromycin (ZITHROMAX) 500 mg in sodium chloride 0.9 % 250 mL IVPB     500 mg 250  mL/hr over 60 Minutes Intravenous  Once 05/31/18 1307 05/31/18 1503       Objective: Physical Exam: Vitals:   06/01/18 0758 06/01/18 0819 06/01/18 1406 06/01/18 1707  BP: (!) 143/65   126/67  Pulse: 63   71  Resp: (!) 22   (!) 26  Temp: 98.2 F (36.8 C)   97.9 F (36.6 C)  TempSrc: Oral   Oral  SpO2: 100% 95% 97% 97%  Weight:      Height:         Intake/Output Summary (Last 24 hours) at 06/01/2018 1732 Last data filed at 06/01/2018 0900 Gross per 24 hour  Intake 343.06 ml  Output -  Net 343.06 ml   Filed Weights   05/31/18 1615  Weight: 72.2 kg   General: Alert, Awake and Oriented to Time, Place and Person. Appear in moderate distress, affect appropriate Eyes: PERRL, Conjunctiva normal ENT: Oral Mucosa clear moist. Neck: no JVD, no Abnormal Mass Or lumps Cardiovascular: S1 and S2 Present, no Murmur, Peripheral Pulses Present Respiratory: increased respiratory effort, Bilateral Air entry equal and Decreased, no use of accessory muscle, no Crackles, bilateral  wheezes Abdomen: Bowel Sound present, Soft and no tenderness, no hernia Skin: no redness, no Rash, no induration Extremities: no Pedal edema, no calf tenderness Neurologic: Grossly no focal neuro deficit. Bilaterally Equal motor strength  Data Reviewed: CBC: Recent Labs  Lab 05/31/18 0910 06/01/18 0311  WBC 10.5 9.3  NEUTROABS 5.1  --   HGB 10.4* 10.1*  HCT 32.3* 31.3*  MCV 93.4 91.5  PLT 423* 578   Basic Metabolic Panel: Recent Labs  Lab 05/31/18 0910 06/01/18 0311  NA 139 139  K 4.2 5.0  CL 103 105  CO2 26 19*  GLUCOSE 123* 200*  BUN 36* 44*  CREATININE 2.18* 1.89*  CALCIUM 9.2 8.5*    Liver Function Tests: Recent Labs  Lab 05/31/18 0910  AST 32  ALT 23  ALKPHOS 54  BILITOT 0.7  PROT 6.3*  ALBUMIN 2.5*   No results for input(s): LIPASE, AMYLASE in the last 168 hours. No results for input(s): AMMONIA in the last 168 hours. Coagulation Profile: No results for input(s): INR, PROTIME in the last 168 hours. Cardiac Enzymes: No results for input(s): CKTOTAL, CKMB, CKMBINDEX, TROPONINI in the last 168 hours. BNP (last 3 results) No results for input(s): PROBNP in the last 8760 hours. CBG: Recent Labs  Lab 06/01/18 0739 06/01/18 1347 06/01/18 1610  GLUCAP 206* 354* 207*   Studies: No results found.   Time spent: 35  minutes  Author: Berle Mull, MD [facility] Pager: [phone] 06/01/2018 5:32 PM  If 7PM-7AM, please contact night-coverage at [email or web address], password [facility]

## 2018-06-01 NOTE — Evaluation (Signed)
Physical Therapy Evaluation Patient Details Name: Jill [name] MRN: [id number] DOB: 04/21/1932 Today's Date: 06/01/2018   History of Present Illness  Patient is a 84 y/o female who presents with SOB and weakness likely PNA. Chest CT-bilateral pleural effusions. PMH includes depression, DM, HLD, HTN, COPD, CKD.  Clinical Impression  Patient presents with generalized weakness, SOB, impaired balance and impaired mobility s/p above. Tolerated transfer training and gait training with Min A-min guard assist for balance/safety. Pt with bil knee instability requiring multiple seated rest breaks during ambulation. Limited by fatigue and SOB. Pt lives alone and Mod I PTA. Reports a son lives close and is retired. Pt high fall risk. Would benefit from SNF to maximize independence and mobility prior to return home.     Follow Up Recommendations SNF;Supervision for mobility/OOB    Equipment Recommendations  None recommended by PT    Recommendations for Other Services       Precautions / Restrictions Precautions Precautions: Fall Restrictions Weight Bearing Restrictions: No      Mobility  Bed Mobility Overal bed mobility: Needs Assistance Bed Mobility: Supine to Sit     Supine to sit: Supervision;HOB elevated     General bed mobility comments: USe of rail, increased time.   Transfers Overall transfer level: Needs assistance Equipment used: Rolling walker (2 wheeled) Transfers: Sit to/from [facility] Sit to Stand: Min guard;Min assist Stand pivot transfers: Min assist       General transfer comment: Min A progressing to Min guard for safety. Cues for hand placement/technique. SPT bed to [facility] with Min A for balance.   Ambulation/Gait Ambulation/Gait assistance: Min guard Gait Distance (Feet): 20 Feet(+40') Assistive device: Rolling walker (2 wheeled) Gait Pattern/deviations: Step-through pattern;Decreased stride length;Trunk flexed Gait velocity: decreased    General Gait Details: Slow, unsteady gait with bil knee instability. 2/4 DOE. Requires immediate seated rest breaks without much warning due to leg giving out. Chair follow provided.   Stairs            Wheelchair Mobility    Modified Rankin (Stroke Patients Only)       Balance Overall balance assessment: Needs assistance;History of Falls Sitting-balance support: Feet supported;No upper extremity supported Sitting balance-Leahy Scale: Good Sitting balance - Comments: Not able to reach down and donn socks without assist.    Standing balance support: During functional activity;Bilateral upper extremity supported Standing balance-Leahy Scale: Fair Standing balance comment: Requires BUE support.                             Pertinent Vitals/Pain Pain Assessment: No/denies pain    Home Living Family/patient expects to be discharged to:: Private residence Living Arrangements: Alone Available Help at Discharge: Family;Available PRN/intermittently Type of Home: House Home Access: Stairs to enter Entrance Stairs-Rails: Right Entrance Stairs-Number of Steps: 2 Home Layout: One level Home Equipment: Clinical cytogeneticist - 2 wheels;Walker - 4 wheels;Wheelchair - manual;Hand held shower head      Prior Function Level of Independence: Independent with assistive device(s)         Comments: Uses RW for ambulation. Drives.      Hand Dominance   Dominant Hand: Right    Extremity/Trunk Assessment   Upper Extremity Assessment Upper Extremity Assessment: Defer to OT evaluation    Lower Extremity Assessment Lower Extremity Assessment: Generalized weakness       Communication   Communication: HOH  Cognition Arousal/Alertness: Awake/alert Behavior During Therapy: WFL for tasks assessed/performed  Overall Cognitive Status: No family/caregiver present to determine baseline cognitive functioning                                 General Comments:  Seems WFL for basic tasks but does not know why she is in the hospital. A&Ox3.      General Comments      Exercises     Assessment/Plan    PT Assessment Patient needs continued PT services  PT Problem List Decreased strength;Decreased mobility;Decreased balance;Cardiopulmonary status limiting activity;Decreased cognition       PT Treatment Interventions Functional mobility training;Balance training;Patient/family education;Gait training;Therapeutic activities;Therapeutic exercise;Stair training;Neuromuscular re-education;Cognitive remediation    PT Goals (Current goals can be found in the Care Plan section)  Acute Rehab PT Goals Patient Stated Goal: to return home and get stronger PT Goal Formulation: With patient Time For Goal Achievement: 06/15/18 Potential to Achieve Goals: Good    Frequency Min 3X/week   Barriers to discharge Decreased caregiver support lives alone    Co-evaluation               AM-PAC PT "6 Clicks" Daily Activity  Outcome Measure Difficulty turning over in bed (including adjusting bedclothes, sheets and blankets)?: None Difficulty moving from lying on back to sitting on the side of the bed? : A Little Difficulty sitting down on and standing up from a chair with arms (e.g., wheelchair, bedside commode, etc,.)?: A Little Help needed moving to and from a bed to chair (including a wheelchair)?: A Little Help needed walking in hospital room?: A Little Help needed climbing 3-5 steps with a railing? : A Lot 6 Click Score: 18    End of Session Equipment Utilized During Treatment: Gait belt Activity Tolerance: Patient tolerated treatment well Patient left: in chair;with call bell/phone within reach Nurse Communication: Mobility status PT Visit Diagnosis: Difficulty in walking, not elsewhere classified (R26.2);Muscle weakness (generalized) (M62.81)    Time: 4492-0100 PT Time Calculation (min) (ACUTE ONLY): 39 min   Charges:   PT Evaluation $PT  Eval Moderate Complexity: 1 Mod PT Treatments $Gait Training: 8-22 mins $Therapeutic Activity: 8-22 mins        Wray Kearns, [place], DPT Acute Rehabilitation Services Pager [phone] Office [place] 06/01/2018, 2:48 PM

## 2018-06-02 LAB — CBC
HCT: 29.6 % — ABNORMAL LOW (ref 36.0–46.0)
Hemoglobin: 9.3 g/dL — ABNORMAL LOW (ref 12.0–15.0)
MCH: 29.4 pg (ref 26.0–34.0)
MCHC: 31.4 g/dL (ref 30.0–36.0)
MCV: 93.7 fL (ref 80.0–100.0)
NRBC: 0 % (ref 0.0–0.2)
PLATELETS: 462 10*3/uL — AB (ref 150–400)
RBC: 3.16 MIL/uL — ABNORMAL LOW (ref 3.87–5.11)
RDW: 13.4 % (ref 11.5–15.5)
WBC: 17.5 10*3/uL — ABNORMAL HIGH (ref 4.0–10.5)

## 2018-06-02 LAB — BASIC METABOLIC PANEL
Anion gap: 8 (ref 5–15)
BUN: 50 mg/dL — ABNORMAL HIGH (ref 8–23)
CALCIUM: 8.7 mg/dL — AB (ref 8.9–10.3)
CO2: 25 mmol/L (ref 22–32)
CREATININE: 2.11 mg/dL — AB (ref 0.44–1.00)
Chloride: 106 mmol/L (ref 98–111)
GFR calc non Af Amer: 20 mL/min — ABNORMAL LOW (ref >60)
GFR, EST AFRICAN AMERICAN: 24 mL/min — AB (ref >60)
GLUCOSE: 145 mg/dL — AB (ref 70–99)
Potassium: 4.2 mmol/L (ref 3.5–5.1)
Sodium: 139 mmol/L (ref 135–145)

## 2018-06-02 LAB — GLUCOSE, CAPILLARY
Glucose-Capillary: 97 mg/dL (ref 70–99)
Glucose-Capillary: 218 mg/dL — ABNORMAL HIGH (ref 70–99)

## 2018-06-02 MED ORDER — SODIUM CHLORIDE 0.9 % IV SOLN
1.0000 g | INTRAVENOUS | Status: DC
  Filled 2018-06-02: qty 10

## 2018-06-02 MED ORDER — CEFDINIR 300 MG PO CAPS
300.0000 mg | ORAL_CAPSULE | ORAL | Status: DC
  Filled 2018-06-02: qty 1

## 2018-06-02 MED ORDER — AZITHROMYCIN 500 MG PO TABS: 500.0000 mg | ORAL_TABLET | Freq: Every day | ORAL | 0 refills | Status: AC

## 2018-06-02 MED ORDER — PREDNISONE 10 MG PO TABS: ORAL_TABLET | ORAL | 0 refills | Status: DC

## 2018-06-02 MED ORDER — CEFDINIR 300 MG PO CAPS: 300.0000 mg | ORAL_CAPSULE | Freq: Two times a day (BID) | ORAL | 0 refills | Status: AC

## 2018-06-02 NOTE — Plan of Care (Signed)
Discussed plan of care for the evening with patient.  Emphasized using the call bell when assistance is needed.  Patient received flu vaccine today.  Explained she may experience soreness at the injection site.  She may also have body aches and a mild fever shortly after receiving the vaccine.  Patient anticipates being discharged tomorrow.  Good teach back displayed.

## 2018-06-02 NOTE — Care Management Note (Addendum)
Case Management Note  Patient Details  Name: Jill [name] MRN: [id number] Date of Birth: 14-Sep-1931  Subjective/Objective:    From home, presents with recent pna was on levaquin by PCP, now with sob, has copd, gout, chronic renal failure, anxiety/depression, dm, hld, htn, diastolic chf, uti, oa.   Await  Pt eval  10/12 Tomi Bamberger RN, BSN - per pt eval rec SNF, patient refusing snf would like to go home with [facility] services.  NCM spoke with patient's daughter who is at bedside, she chose [facility], referral given to [facility] for [facility], [place] and [facility].  Alwyn Ren will check to see if they can take referral and call this NCM back.  Received call back and Alwyn Ren states they are not able to take referral.  NCM informed daughter and her next choice was [facility], NCM left referral message for Dorian Pod . Awaiting call back. Dorian Pod with [facility] was able to take referral. Soc will begin 24-48 hrs post dc.                               Action/Plan: DC home when ready.  Expected Discharge Date:  06/02/18               Expected Discharge Plan:  [place]  In-House Referral:     Discharge planning Services  CM Consult  Post Acute Care Choice:  Home Health Choice offered to:  Adult Children  DME Arranged:    DME Agency:     HH Arranged:  Disease Management, RN, PT, Social Work [employer] Agency:  [facility]  Status of Service:  Completed, signed off  If discussed at [employer], dates discussed:    Additional Comments:  Zenon Mayo, RN 06/02/2018, 11:42 AM

## 2018-06-02 NOTE — Care Management Note (Addendum)
Case Management Note  Patient Details  Name: Jill [name] MRN: [id number] Date of Birth: [birth year]  Subjective/Objective:   From home, presents with recent pna was on levaquin by PCP, now with sob, has copd, gout, chronic renal failure, anxiety/depression, dm, hld, htn, diastolic chf, uti, oa.   Await  Pt eval  10/12 Tomi Bamberger RN, BSN - per pt eval rec SNF, patient refusing snf would like to go home with [facility] services.  NCM spoke with patient's daughter who is at bedside, she chose [facility], referral given to [facility] for [facility], [place] and [facility].  Alwyn Ren will check to see if they can take referral and call this NCM back.  Received call back and Alwyn Ren states they are not able to take referral.  NCM informed daughter and her next choice was [facility], NCM left referral message for Dorian Pod . Awaiting call back.                Action/Plan: DC home with [facility] services.  Expected Discharge Date:                  Expected Discharge Plan:  [place]  In-House Referral:     Discharge planning Services  CM Consult  Post Acute Care Choice:  Home Health Choice offered to:  Adult Children  DME Arranged:    DME Agency:     HH Arranged:  Disease Management, RN, PT, OT, Social Work [employer] Agency:  [facility] [employer]  Status of Service:  Completed, signed off  If discussed at [employer], dates discussed:    Additional Comments:  Zenon Mayo, RN 06/02/2018, 11:25 AM

## 2018-06-04 ENCOUNTER — Telehealth: Payer: Self-pay | Admitting: Family Medicine

## 2018-06-04 NOTE — Telephone Encounter (Signed)
Verbal order given to [place] for Skilled nursing 1 x week for 8 weeks and 2 prn visits for medication and disease management.  She states OT/PT/Social worker are going out to evaluate and then will call with their orders.

## 2018-06-04 NOTE — Telephone Encounter (Signed)
Copied from [place] [id number]. Topic: Quick Communication - Home Health Verbal Orders >> Jun 04, 2018  1:43 PM Blase Mess A wrote: Caller/Agency: Alda Berth[facility]  Callback Number: [phone] Requesting OT/PT/Skilled Nursing/Social Work:Skilled care frequency 1xa week for 8 week with 2 PRN visits Medication and disease management

## 2018-06-04 NOTE — Discharge Summary (Signed)
[facility] Discharge Summary   Patient: Jill [name] OEU:[id number]   PCP: Jill Sanders, MD DOB: 09/18/1931   Date of admission: 05/31/2018   Date of discharge: 06/02/2018     Discharge Diagnoses:  Active Problems:   Pneumonia   Admitted From: home Disposition:  Home with home health, family refused SNF  Recommendations for Outpatient Follow-up:  1. Please follow up with PCP in  week   Follow-up Information    Jill Sanders, MD. Schedule an appointment as soon as possible for a visit in 1 week(s).   Specialty:  Family Medicine Contact information: [place] [address] [place], [place] Follow up.   Specialty:  Home Health Services Why:  RN, PT, SW Contact information: [place] [address] [phone]          Diet recommendation: cardiac diet  Activity: The patient is advised to gradually reintroduce usual activities.  Discharge Condition: good  Code Status: DNR  History of present illness: As per the H and P dictated on admission, "Jill [name]  is a 84 y.o. female, with past medical history significant for COPD, hypertension chronic kidney disease and hyperlipidemia presenting with 1 week history of shortness of breath and cough.  The patient was seen by her primary medical doctor and she was started on Levaquin on Monday.  There was no significant improvement.  Patient denies any significant chest pains but reports increased coughing episodes.  Son at bedside.  Patient had multiple admissions for pneumonia and she said that she is getting tired of this.  Patient denies any fever or chills in the last 2 days and her temperature in the emergency room was 97 6.  Her chest x-ray showed no definite pneumonia but CT of the chest showed extensive groundglass opacities in the upper lobes.  No history of nausea or vomiting.  Patient lives at home."  Hospital Course:    Summary of her active problems in the hospital is as following. 1.  Acute on chronic hypoxic respiratory failure. Bilateral patchy nodular pneumonia. Presented with cough and shortness of breath. Chest x-ray as well as CT scan of the chest both are showing bilateral patchy pneumonia. Respiratory virus panel is negative for any active viral infection. Patient continues to have some bilateral expiratory wheezing. Oxygenation has improved. Continue with current treatment with antibiotics. I will switch her IV Solu-Medrol to oral prednisone.  2.  Essential hypertension. Chronic kidney disease stage IV. Patient follows up with nephrology outpatient. Was recently started on Midodrin although her blood pressure is currently elevated. I will discontinue midodrine for now.  3.  Depression and anxiety. Continue home regimen.  4.  Steroid-induced hyperglycemia. Patient does not have diabetes. Sugars better  All other chronic medical condition were stable during the hospitalization.  Patient was seen by PT, recommended SNF, family refused SNF and  home health. Arranged  On the day of the discharge the patient's vitals were stable , and no other acute medical condition were reported by patient. the patient was felt safe to be discharge at home with family.  Consultants: none Procedures: none  DISCHARGE MEDICATION: Allergies as of 06/02/2018      Reactions   Lovastatin Other (See Comments)   REACTION: leg pain   Statins Other (See Comments)   REACTION: leg cramps, weakness   Sulfa Antibiotics Hives, Itching   Codeine Rash  Niacin Rash   Flushing also      Medication List    STOP taking these medications   levofloxacin 250 MG tablet Commonly known as:  LEVAQUIN   midodrine 5 MG tablet Commonly known as:  PROAMATINE     TAKE these medications   albuterol 108 (90 Base) MCG/ACT inhaler Commonly known as:  PROVENTIL HFA;VENTOLIN HFA Inhale 2 puffs into the lungs every 4  (four) hours as needed for wheezing or shortness of breath.   ALPRAZolam 0.25 MG tablet Commonly known as:  XANAX Take 1 tablet (0.25 mg total) by mouth 2 (two) times daily as needed for anxiety.   azithromycin 500 MG tablet Commonly known as:  ZITHROMAX Take 1 tablet (500 mg total) by mouth daily for 5 days.   benzonatate 100 MG capsule Commonly known as:  TESSALON Take 1 capsule (100 mg total) by mouth 3 (three) times daily as needed for cough.   BIOTIN PO Take 1 tablet by mouth daily.   cefdinir 300 MG capsule Commonly known as:  OMNICEF Take 1 capsule (300 mg total) by mouth 2 (two) times daily for 5 days.   colchicine 0.6 MG tablet Take 1 tablet (0.6 mg total) by mouth daily as needed (for gout). Okay to fill with mitigare/colchicine/colcrys   diphenhydrAMINE 25 MG tablet Commonly known as:  BENADRYL Take 25 mg by mouth every 6 (six) hours as needed for itching.   escitalopram 20 MG tablet Commonly known as:  LEXAPRO TAKE 1 TABLET BY MOUTH EVERY DAY   fenofibrate 54 MG tablet TAKE ONE TABLET(54mg ) BY MOUTH TWICE DAILY   HYDROcodone-homatropine 5-1.5 MG/5ML syrup Commonly known as:  HYCODAN Take 5 mLs by mouth at bedtime as needed for cough.   multivitamin with minerals tablet Take 1 tablet by mouth daily.   nystatin powder Commonly known as:  MYCOSTATIN/NYSTOP Apply topically 4 (four) times daily.   predniSONE 10 MG tablet Commonly known as:  DELTASONE Take 40mg  daily for 3days,Take 30mg  daily for 3days,Take 20mg  daily for 3days,Take 10mg  daily for 3days, then stop.   tiotropium 18 MCG inhalation capsule Commonly known as:  SPIRIVA Place 1 capsule (18 mcg total) into inhaler and inhale daily.   Vitamin D (Ergocalciferol) 50000 units Caps capsule Commonly known as:  DRISDOL Take 50,000 Units by mouth every Friday.      Allergies  Allergen Reactions  . Lovastatin Other (See Comments)    REACTION: leg pain  . Statins Other (See Comments)    REACTION:  leg cramps, weakness  . Sulfa Antibiotics Hives and Itching  . Codeine Rash  . Niacin Rash    Flushing also   Discharge Instructions    Diet - low sodium heart healthy   Complete by:  As directed    Discharge instructions   Complete by:  As directed    It is important that you read following instructions as well as go over your medication list with RN to help you understand your care after this hospitalization.  Discharge Instructions: Please follow-up with PCP in one week  Please request your primary care physician to go over all Hospital Tests and Procedure/Radiological results at the follow up,  Please get all Hospital records sent to your PCP by signing hospital release before you go home.   Do not take more than prescribed Pain, Sleep and Anxiety Medications. You were cared for by a hospitalist during your hospital stay. If you have any questions about your discharge medications or the care you  received while you were in the hospital after you are discharged, you can call the unit and ask to speak with the hospitalist on call if the hospitalist that took care of you is not available.  Once you are discharged, your primary care physician will handle any further medical issues. Please note that NO REFILLS for any discharge medications will be authorized once you are discharged, as it is imperative that you return to your primary care physician (or establish a relationship with a primary care physician if you do not have one) for your aftercare needs so that they can reassess your need for medications and monitor your lab values. You Must read complete instructions/literature along with all the possible adverse reactions/side effects for all the Medicines you take and that have been prescribed to you. Take any new Medicines after you have completely understood and accept all the possible adverse reactions/side effects. Wear Seat belts while driving. If you have smoked or chewed Tobacco in  the last 2 yrs please stop smoking and/or stop any Recreational drug use.   Increase activity slowly   Complete by:  As directed      Discharge Exam: Filed Weights   05/31/18 1615  Weight: 72.2 kg   Vitals:   06/02/18 0745 06/02/18 0756  BP:  131/74  Pulse:  81  Resp:  18  Temp:  98.4 F (36.9 C)  SpO2: 96% 94%   General: Appear in no distress, no Rash; Oral Mucosa moist. Cardiovascular: S1 and S2 Present, no Murmur, no JVD Respiratory: Bilateral Air entry present and Clear to Auscultation, no Crackles, no wheezes Abdomen: Bowel Sound present, Soft and no tenderness Extremities: no Pedal edema, no calf tenderness Neurology: Grossly no focal neuro deficit.  The results of significant diagnostics from this hospitalization (including imaging, microbiology, ancillary and laboratory) are listed below for reference.    Significant Diagnostic Studies: Dg Chest 1 View  Result Date: 05/28/2018 CLINICAL DATA:  Cough, possible pneumonia EXAM: CHEST  1 VIEW COMPARISON:  Chest x-ray of 07/15/2017 FINDINGS: There is slight elevation of the left hemidiaphragm probably due to gaseous distention of the stomach. There are slightly prominent markings. A vague opacity peripherally at the right lung base could represent a nodule or scarring and comparison with follow-up chest x-ray is recommended to assess stability. Also, a vague nodule in the left upper lobe overlying the posterior left fifth rib cannot be excluded. This was not present on prior chest x-ray. In view of the patient's age follow-up chest x-ray after interval treatment is recommended. Mediastinal and hilar contours are unremarkable. The heart is mildly enlarged and stable. No acute bony abnormality is seen. IMPRESSION: 1. Somewhat prominent markings at the right lung base may reflect developing pneumonia. Consider follow-up chest x-ray. 2. Vague nodular opacity at the right costophrenic angle with second vague nodular opacity in the left  upper lobe. Recommend follow-up chest x-ray after treatment to assess further. Electronically Signed   By: Ivar Drape M.D.   On: 05/28/2018 13:21   Dg Chest 2 View  Result Date: 05/31/2018 CLINICAL DATA:  History of pneumonia, persistent cough and fatigue, follow-up EXAM: CHEST - 2 VIEW COMPARISON:  Portable chest x-ray of 05/28/2018 FINDINGS: Small bilateral pleural effusions rim remain with mild atelectasis at the right lung base. No definite pneumonia is seen although there may be very mild pulmonary vascular congestion present. The previously noted small nodules at the right lung base and possibly in the left upper lobe are less well  seen. Mild cardiomegaly is stable. IMPRESSION: 1. Probable mild pulmonary vascular congestion now present with small pleural effusions and mild basilar atelectasis. 2. No definite pneumonia. 3. Small nodules questioned previously may still be present but less well seen. Recommend continued follow-up with possible CT of the chest if warranted clinically. Electronically Signed   By: Ivar Drape M.D.   On: 05/31/2018 10:54   Ct Chest Wo Contrast  Result Date: 05/31/2018 CLINICAL DATA:  Cough, cold, fever. EXAM: CT CHEST WITHOUT CONTRAST TECHNIQUE: Multidetector CT imaging of the chest was performed following the standard protocol without IV contrast. COMPARISON:  Chest x-ray 05/31/2018 FINDINGS: Cardiovascular: Coronary artery and scattered aortic calcifications. Heart is normal size. Small pericardial effusion. Mediastinum/Nodes: Mildly enlarged mediastinal and likely right hilar lymph nodes although the right hilar lymph nodes are difficult to visualize and measure due to lack of intravenous contrast. Low right paratracheal lymph node has a short axis diameter of 11 mm. Other similarly sized mediastinal lymph nodes. No axillary adenopathy. Lungs/Pleura: There are nodular ground-glass airspace opacities throughout both lungs. This is most pronounced in the upper lobes.  Solid right lower lobe pulmonary nodule on image 103 measures 5 mm. Similarly sized left lower lobe nodule on image 92. Other smaller scattered solid nodules in the lower lungs. Trace bilateral effusions. Upper Abdomen: Imaging into the upper abdomen shows no acute findings. Musculoskeletal: Chest wall soft tissues are unremarkable. No acute bony abnormality. IMPRESSION: Extensive nodular ground-glass airspace disease throughout both lungs, most pronounced in the upper lobes. Favor infectious/inflammatory process. There are scattered bilateral solid pulmonary nodules, 5 mm or less in size. These may also be inflammatory, but recommend attention on follow-up imaging. Trace bilateral pleural effusions and small pericardial effusion. Scattered coronary artery and aortic calcifications. Recommend follow-up chest CT in 6 months after treatment to ensure improvement or stability in pulmonary nodules. Electronically Signed   By: Rolm Baptise M.D.   On: 05/31/2018 12:47    Microbiology: Recent Results (from the past 240 hour(s))  Culture, blood (routine x 2)     Status: None (Preliminary result)   Collection Time: 05/31/18  9:50 AM  Result Value Ref Range Status   Specimen Description BLOOD LEFT WRIST  Final   Special Requests   Final    BOTTLES DRAWN AEROBIC AND ANAEROBIC Blood Culture results may not be optimal due to an inadequate volume of blood received in culture bottles   Culture   Final    NO GROWTH 3 DAYS Performed at [facility], [place] [address] [place], [place] [address]    Report Status PENDING  Incomplete  Culture, blood (routine x 2)     Status: None (Preliminary result)   Collection Time: 05/31/18  9:56 AM  Result Value Ref Range Status   Specimen Description BLOOD RIGHT ANTECUBITAL  Final   Special Requests   Final    BOTTLES DRAWN AEROBIC AND ANAEROBIC Blood Culture adequate volume   Culture   Final    NO GROWTH 3 DAYS Performed at [facility], [place] [address],  [place], [place] [address]    Report Status PENDING  Incomplete  Urine culture     Status: None   Collection Time: 05/31/18 11:42 AM  Result Value Ref Range Status   Specimen Description URINE, RANDOM  Final   Special Requests NONE  Final   Culture   Final    NO GROWTH Performed at [facility], [address], [place], [place] [address]  Report Status 06/01/2018 FINAL  Final  Respiratory Panel by PCR     Status: None   Collection Time: 05/31/18 11:42 AM  Result Value Ref Range Status   Adenovirus NOT DETECTED NOT DETECTED Final   Coronavirus 229E NOT DETECTED NOT DETECTED Final   Coronavirus HKU1 NOT DETECTED NOT DETECTED Final   Coronavirus NL63 NOT DETECTED NOT DETECTED Final   Coronavirus OC43 NOT DETECTED NOT DETECTED Final   Metapneumovirus NOT DETECTED NOT DETECTED Final   Rhinovirus / Enterovirus NOT DETECTED NOT DETECTED Final   Influenza A NOT DETECTED NOT DETECTED Final   Influenza B NOT DETECTED NOT DETECTED Final   Parainfluenza Virus 1 NOT DETECTED NOT DETECTED Final   Parainfluenza Virus 2 NOT DETECTED NOT DETECTED Final   Parainfluenza Virus 3 NOT DETECTED NOT DETECTED Final   Parainfluenza Virus 4 NOT DETECTED NOT DETECTED Final   Respiratory Syncytial Virus NOT DETECTED NOT DETECTED Final   Bordetella pertussis NOT DETECTED NOT DETECTED Final   Chlamydophila pneumoniae NOT DETECTED NOT DETECTED Final   Mycoplasma pneumoniae NOT DETECTED NOT DETECTED Final    Comment: Performed at [facility], [place] [address], [place], [place] [address]     Labs: CBC: Recent Labs  Lab 05/31/18 0910 06/01/18 0311 06/02/18 0231  WBC 10.5 9.3 17.5*  NEUTROABS 5.1  --   --   HGB 10.4* 10.1* 9.3*  HCT 32.3* 31.3* 29.6*  MCV 93.4 91.5 93.7  PLT 423* 381 546*   Basic Metabolic Panel: Recent Labs  Lab 05/31/18 0910 06/01/18 0311 06/02/18 0231  NA 139 139 139  K 4.2 5.0 4.2  CL 103 105 106  CO2 26 19* 25  GLUCOSE 123* 200* 145*  BUN 36* 44* 50*  CREATININE  2.18* 1.89* 2.11*  CALCIUM 9.2 8.5* 8.7*   Liver Function Tests: Recent Labs  Lab 05/31/18 0910  AST 32  ALT 23  ALKPHOS 54  BILITOT 0.7  PROT 6.3*  ALBUMIN 2.5*   No results for input(s): LIPASE, AMYLASE in the last 168 hours. No results for input(s): AMMONIA in the last 168 hours. Cardiac Enzymes: No results for input(s): CKTOTAL, CKMB, CKMBINDEX, TROPONINI in the last 168 hours. BNP (last 3 results) Recent Labs    05/31/18 0925  BNP 239.3*   CBG: Recent Labs  Lab 06/01/18 1347 06/01/18 1610 06/01/18 2105 06/02/18 0758 06/02/18 1204  GLUCAP 354* 207* 130* 97 218*   Time spent: 35 minutes  Signed:  Berle Mull  [facility] 06/02/2018, 7:12 AM

## 2018-06-05 ENCOUNTER — Ambulatory Visit: Payer: Medicare Other | Admitting: Family Medicine

## 2018-06-05 ENCOUNTER — Encounter: Payer: Self-pay | Admitting: Family Medicine

## 2018-06-05 VITALS — BP 120/70 | HR 83 | Temp 98.5°F | Ht 62.5 in | Wt 163.2 lb

## 2018-06-05 DIAGNOSIS — N184 Chronic kidney disease, stage 4 (severe): Secondary | ICD-10-CM

## 2018-06-05 DIAGNOSIS — J449 Chronic obstructive pulmonary disease, unspecified: Secondary | ICD-10-CM | POA: Diagnosis not present

## 2018-06-05 DIAGNOSIS — J189 Pneumonia, unspecified organism: Secondary | ICD-10-CM | POA: Diagnosis not present

## 2018-06-05 DIAGNOSIS — H8110 Benign paroxysmal vertigo, unspecified ear: Secondary | ICD-10-CM

## 2018-06-05 DIAGNOSIS — M10371 Gout due to renal impairment, right ankle and foot: Secondary | ICD-10-CM

## 2018-06-05 LAB — CULTURE, BLOOD (ROUTINE X 2)
CULTURE: NO GROWTH
Culture: NO GROWTH
SPECIAL REQUESTS: ADEQUATE

## 2018-06-05 MED ORDER — BUDESONIDE-FORMOTEROL FUMARATE 160-4.5 MCG/ACT IN AERO: 2.0000 | INHALATION_SPRAY | Freq: Two times a day (BID) | RESPIRATORY_TRACT | 3 refills | Status: DC

## 2018-06-05 NOTE — Assessment & Plan Note (Signed)
On spiriva

## 2018-06-05 NOTE — Progress Notes (Signed)
   Subjective:    Patient ID: Jill [name], female    DOB: 1931-11-21, 84 y.o.   MRN: [id number]  HPI    84 year old female with COPD, HTN, CKD presents for hospital follow up for PNA. Went to ER given progressive weakness and SOB despite Levaquin for CA PNA.  Hospital Summary copied below:  1.Acute on chronic hypoxic respiratory failure. Bilateral patchy nodular pneumonia. Presented with cough and shortness of breath. Chest x-ray as well as CT scan of the chest both are showing bilateral patchy pneumonia. Respiratory virus panel is negative for any active viral infection. Patient continues to have some bilateral expiratory wheezing. Oxygenation has improved. Continue with current treatment with antibiotics. I will switch her IV Solu-Medrol to oral prednisone.  2.Essential hypertension. Chronic kidney disease stage IV. Patient follows up with nephrology outpatient. Was recently started on Midodrin although her blood pressure is currently elevated. I will discontinue midodrine for now.  3.Depression and anxiety. Continue home regimen.  4.Steroid-induced hyperglycemia. Patient does not have diabetes. Sugars better  All other chronic medical condition were stable during the hospitalization.  Patient was seen by PT, recommended SNF, family refused SNF and  home health. Arranged   06/05/18 Today she reports she  Has significant improvement in breathing but still weak.  Using albuterol prn. Has 5 more days of  Azithromycin,  omnicef to complete antibiotics. On pred taper.   Right foot  Gout flare.. Now improvement on prednisone and colchicine.  Redness improved. Minimal pain.   Now off midodrine given BP in nml range.  CKD: Cr 2.11 at discharge Followed by Dr. Candiss Norse.  Has appt on 10/31 with D.r Candiss Norse  GFR 20   She has been having vertigo... When lying on back and looking up she get room spinning.Marland Kitchen Has had in past.  Blood pressure 120/70, pulse 83,  temperature 98.5 F (36.9 C), temperature source Oral, height 5' 2.5" (1.588 m), weight 163 lb 4 oz (74 kg), SpO2 96 %. Social History /Family History/Past Medical History reviewed in detail and updated in EMR if needed.  Review of Systems     Objective:   Physical Exam        Assessment & Plan:

## 2018-06-05 NOTE — Patient Instructions (Addendum)
Stay off midodrine as long as BP  In nml range.. Discuss further use with renal MD. Follow BP at home.. May decrease back down once off prednisone.  Keep renal appt.,  Start inhaler for better COPD control once prednisone complete  Start home vertgio exercises.

## 2018-06-06 ENCOUNTER — Other Ambulatory Visit: Payer: Self-pay | Admitting: Family Medicine

## 2018-06-06 ENCOUNTER — Telehealth: Payer: Self-pay | Admitting: *Deleted

## 2018-06-06 NOTE — Telephone Encounter (Signed)
Okay to give verbal orders as requested. 

## 2018-06-06 NOTE — Telephone Encounter (Signed)
Last office visit 06/05/2018 for hospital follow up.  Last refilled ?  Last Vit D level 07/03/2017 with was normal at 84.40 ng/ml.  Refill?

## 2018-06-06 NOTE — Telephone Encounter (Signed)
Copied from [place] [id number]. Topic: Quick Communication - Home Health Verbal Orders >> Jun 06, 2018 11:34 AM Hewitt Shorts wrote: Caller/Agency:kendra richardson/[facility] Callback Number: [phone] Requesting OT/PT/Skilled Nursing/Social Work: pt Sharma Covert training Frequency: 2xtime a week for 3 weeks

## 2018-06-06 NOTE — Telephone Encounter (Signed)
Verbal order given to [facility] for OT/PT/Skilled Nursing/Social Work: to help with pt /balance training 2 x a week for 3 weeks per Dr. Diona Browner.

## 2018-06-19 ENCOUNTER — Encounter: Payer: Self-pay | Admitting: Family Medicine

## 2018-06-19 ENCOUNTER — Other Ambulatory Visit: Payer: Self-pay | Admitting: Family Medicine

## 2018-07-02 NOTE — Assessment & Plan Note (Signed)
Resolving . Complete antibiotics.

## 2018-07-02 NOTE — Assessment & Plan Note (Signed)
Albuterol and pred taper.

## 2018-07-02 NOTE — Assessment & Plan Note (Signed)
Stable at discharge, Follow up with nephrology Dr. Candiss Norse.

## 2018-07-02 NOTE — Assessment & Plan Note (Signed)
ow improvement on prednisone and colchicine.

## 2018-07-02 NOTE — Assessment & Plan Note (Signed)
Restart home desensitization exercises.

## 2018-07-03 ENCOUNTER — Telehealth: Payer: Self-pay | Admitting: *Deleted

## 2018-07-03 NOTE — Telephone Encounter (Signed)
Verbal orders given to [facility] as instructed by Dr. Diona Browner.

## 2018-07-03 NOTE — Telephone Encounter (Signed)
Spoke to [place] with [facility] who reports pt had positive test for vertigo in R ear.  Pt has been treated but Jill [name] is requesting an order for one more Tx with epley maneuver, to help prevent falls. She is requesting a call back with order, as faxing them takes too long for her to receive.  pls advise

## 2018-07-03 NOTE — Telephone Encounter (Signed)
Okay ot given verbal order as requested per documentation.

## 2018-07-07 ENCOUNTER — Other Ambulatory Visit: Payer: Self-pay | Admitting: Family Medicine

## 2018-07-10 ENCOUNTER — Other Ambulatory Visit: Payer: Self-pay | Admitting: Family Medicine

## 2018-07-10 NOTE — Telephone Encounter (Signed)
Electronic refill request. Colchicine Last office visit:   06/05/18 Acute Last Filled:    7 tablet 0 05/28/2018  Patient is having a flare up.  Please advise.

## 2018-07-11 MED ORDER — COLCHICINE 0.6 MG PO TABS: 0.6000 mg | ORAL_TABLET | Freq: Every day | ORAL | 0 refills | Status: DC | PRN

## 2018-07-17 DIAGNOSIS — I5032 Chronic diastolic (congestive) heart failure: Secondary | ICD-10-CM

## 2018-07-17 DIAGNOSIS — I130 Hypertensive heart and chronic kidney disease with heart failure and stage 1 through stage 4 chronic kidney disease, or unspecified chronic kidney disease: Secondary | ICD-10-CM

## 2018-07-17 DIAGNOSIS — J181 Lobar pneumonia, unspecified organism: Secondary | ICD-10-CM

## 2018-07-17 DIAGNOSIS — J9611 Chronic respiratory failure with hypoxia: Secondary | ICD-10-CM

## 2018-07-17 DIAGNOSIS — J440 Chronic obstructive pulmonary disease with acute lower respiratory infection: Secondary | ICD-10-CM | POA: Diagnosis not present

## 2018-07-30 ENCOUNTER — Encounter: Payer: Self-pay | Admitting: Family Medicine

## 2018-07-30 ENCOUNTER — Ambulatory Visit: Payer: Medicare Other | Admitting: Family Medicine

## 2018-07-30 ENCOUNTER — Telehealth: Payer: Self-pay

## 2018-07-30 VITALS — BP 138/68 | HR 80 | Temp 98.2°F | Ht 62.5 in | Wt 168.5 lb

## 2018-07-30 DIAGNOSIS — N939 Abnormal uterine and vaginal bleeding, unspecified: Secondary | ICD-10-CM | POA: Insufficient documentation

## 2018-07-30 NOTE — Patient Instructions (Signed)
Your exam was re assuring -but I do thing the bleeding was vaginal (you still have some brown discharge present) We will refer you to gyn next

## 2018-07-30 NOTE — Telephone Encounter (Signed)
Patient called [facility] on 07/29/18 at 1230pm reporting vaginal bleeding as outlined below.   Jill [name] contacted office this morning and was able to get patient in to the office today to see Dr. Glori Bickers at 1130am.   Refer to office visit encounter for further info.

## 2018-07-30 NOTE — Telephone Encounter (Signed)
Jill [name] (DPR signed) pt started during the night on 07/28/18 with vaginal bright red bleeding; vaginal bleeding lightened up last night at 9 AM. No abd pain or fever and no injury. Pt is still asleep so not sure how bleeding is this morning. Will take at least 1 1/2 - 2 hrs to get ready for appt. Jill [name] spoke with nurse over weekend and was advised to see provider. Pt has 30' appt to see Dr Glori Bickers 07/30/18 at 11:30 AM. If pt condition worsens prior to appt pt will go to [facility] or ED. FYI to Dr Glori Bickers.

## 2018-07-30 NOTE — Telephone Encounter (Signed)
I will see her then  

## 2018-07-30 NOTE — Assessment & Plan Note (Signed)
Post menopausal vaginal bleeding (bright red) w/o other symptoms in pt with h/o hysterectomy in 1977 for fibroids Limited exam (some tenderness with speculum)  found old blood in vagina as well as a non bleeding urethral caruncle  No vaginal lesions or injury seen  Will refer to gyn for further eval inst to alert [place] if symptoms return or worsen

## 2018-07-30 NOTE — Progress Notes (Signed)
Subjective:    Patient ID: Jill [name], female    DOB: 06/10/1932, 84 y.o.   MRN: [id number]  HPI Here for c/o vaginal bleeding   84 yo pt of Dr Diona Browner with hx of copd and DM and recurrent uti  Hx of abdominal hysterectomy in 1977 Partial  Thinks she has fibroids   She does not take aspirin or blood thinners   Heavy and bright red on Saturday (noticed when she got out of bed)  It slowed down and then stopped Sunday to Sunday evening  Now just a bit of spotting   Has had a little vaginal itching  No discharge Not sexually active   No abd pain or cramping   She is sure it was vaginal        Review of Systems  Constitutional: Negative for activity change, appetite change, fatigue and fever.  HENT: Negative for sore throat.   Eyes: Negative for visual disturbance.  Respiratory: Negative for cough and shortness of breath.   Cardiovascular: Negative for chest pain and leg swelling.  Gastrointestinal: Negative for abdominal distention, abdominal pain, anal bleeding, blood in stool, constipation, diarrhea, nausea, rectal pain and vomiting.  Genitourinary: Positive for vaginal bleeding. Negative for difficulty urinating, flank pain, frequency, hematuria, pelvic pain, vaginal discharge and vaginal pain.  Musculoskeletal: Positive for arthralgias.  Skin: Negative for pallor.  Hematological: Does not bruise/bleed easily.       Objective:   Physical Exam  Constitutional: She appears well-developed and well-nourished. No distress.  overwt elderly female in no distress    HENT:  Head: Normocephalic and atraumatic.  Mouth/Throat: Oropharynx is clear and moist.  Eyes: Pupils are equal, round, and reactive to light. Conjunctivae and EOM are normal.  Neck: Normal range of motion. Neck supple. No JVD present. Carotid bruit is not present. No thyromegaly present.  Cardiovascular: Normal rate, regular rhythm, normal heart sounds and intact distal pulses. Exam reveals no gallop.   Pulmonary/Chest: Effort normal and breath sounds normal. No respiratory distress. She has no wheezes. She has no rales.  No crackles  Abdominal: Soft. Bowel sounds are normal. She exhibits no distension, no abdominal bruit and no mass. There is no tenderness. There is no rebound and no guarding.  Genitourinary:  Genitourinary Comments:         Anus appears normal w/o hemorrhoids or masses     External genitalia : nl appearance and hair distribution/no lesions     Urethral meatus : nl size, urethral caruncle present which is not bleeding or tender     Urethra: no tenderness or scarring    Bladder : no masses or tenderness     Vagina: nl general appearance, no ;esions, no significant rectocele , cystocele noted as well as brown d/c (old blood) present     no cervix observed     Adnexa : no masses, tenderness, enlargement or nodularity  No discomfort on bimanual exam  Some discomfort with speculum - unable to see as far as cervical cuff        Musculoskeletal: She exhibits no edema.  Lymphadenopathy:    She has no cervical adenopathy.  Neurological: She is alert. She has normal reflexes. No cranial nerve deficit. Coordination normal.  Skin: Skin is warm and dry. No rash noted.  Psychiatric: She has a normal mood and affect.  Pleasant           Assessment & Plan:   Problem List Items Addressed This Visit  Other   Vaginal bleeding - Primary    Post menopausal vaginal bleeding (bright red) w/o other symptoms in pt with h/o hysterectomy in 1977 for fibroids Limited exam (some tenderness with speculum)  found old blood in vagina as well as a non bleeding urethral caruncle  No vaginal lesions or injury seen  Will refer to gyn for further eval inst to alert [place] if symptoms return or worsen        Relevant Orders   Ambulatory referral to Gynecology

## 2018-08-01 ENCOUNTER — Other Ambulatory Visit: Payer: Self-pay | Admitting: Family Medicine

## 2018-08-02 ENCOUNTER — Encounter: Payer: Self-pay | Admitting: Urology

## 2018-08-02 ENCOUNTER — Ambulatory Visit: Payer: Medicare Other | Admitting: Urology

## 2018-08-02 VITALS — BP 147/68 | HR 78 | Ht 62.5 in | Wt 166.0 lb

## 2018-08-02 DIAGNOSIS — N939 Abnormal uterine and vaginal bleeding, unspecified: Secondary | ICD-10-CM

## 2018-08-02 NOTE — Patient Instructions (Signed)
Using your finger, place a "blueberry sized" amount of the Premarin cream to the urethral caruncle every night for two weeks.  Then decrease the application frequency to Monday, Wednesday and Friday.

## 2018-08-02 NOTE — Progress Notes (Signed)
08/02/2018  3:45 PM   Jill [name] Aye Dec 07, 1931 [id number]  Referring provider: Jinny Sanders, MD [address] [place], [place] [address]  Chief Complaint  Patient presents with  . Vaginal Bleeding   HPI: Patient is an 84 year old Caucasian female who has a history of a renal abscess, recurrent UTI, vaginal atrophy and incomplete bladder emptying who presents today for vaginal bleeding.    She was referred here by her GYN and PCP. She noticed vaginal bleeding starting 2-3 days ago. She went to urinate and noticed "bright red" blood when she wiped as far as she can remember. She reports it occurred multiple time throughout the day then through that night. It has tappered off but she noticed spotting yesterday. Denies noting it this morning. Denies dysuria and suprapubic/flank pain. She denies nausea, vomiting or diarrhea.  She was seen by Dr. Glori Bickers on 07/30/2018 and was noted to have old blood in her vaginal canal and an urethral caruncle.  She was then referred to her gynecologist, Dr. Everlene Farrier, and a pelvic exam was performed on 08/01/2018.  She was noted to have vaginal atrophy, a small amount of bright red blood in the introitus and a friable urethral caruncle.    She has had a hysterectomy.   History of renal abscess An MRI done in 06/2015 noted that the renal abscess resolved.   History of recurrent UTI Risk factors for rUTI's: age and vaginal atrophy    PMH: Past Medical History:  Diagnosis Date  . Allergy   . Arthritis    osteoarthritis   . Chronic kidney disease   . Complication of anesthesia    difficult waking   . Depression   . Diabetes mellitus   . Diverticula, colon   . Family history of anesthesia complication    Son is difficult intubation  . Hyperlipidemia   . Hypertension   . Vasovagal syncope 2006   Negative cardiac workup-myoview, echo    Surgical History: Past Surgical History:  Procedure Laterality Date  . ABDOMINAL HYSTERECTOMY   1977   fibroid  . JOINT REPLACEMENT  2009   rt hip  . TOTAL HIP ARTHROPLASTY Left 05/12/2014   dr Mayer Camel  . TOTAL HIP ARTHROPLASTY Left 05/12/2014   Procedure: TOTAL HIP ARTHROPLASTY;  Surgeon: Kerin Salen, MD;  Location: [facility];  Service: Orthopedics;  Laterality: Left;   Home Medications:  Allergies as of 08/02/2018      Reactions   Lovastatin Other (See Comments)   REACTION: leg pain   Statins Other (See Comments)   REACTION: leg cramps, weakness   Sulfa Antibiotics Hives, Itching   Codeine Rash   Niacin Rash   Flushing also      Medication List       Accurate as of August 02, 2018  3:45 PM. Always use your most recent med list.        albuterol 108 (90 Base) MCG/ACT inhaler Commonly known as:  PROVENTIL HFA;VENTOLIN HFA Inhale 2 puffs into the lungs every 4 (four) hours as needed for wheezing or shortness of breath.   ALPRAZolam 0.25 MG tablet Commonly known as:  XANAX Take 1 tablet (0.25 mg total) by mouth 2 (two) times daily as needed for anxiety.   BIOTIN PO Take 1 tablet by mouth daily.   budesonide-formoterol 160-4.5 MCG/ACT inhaler Commonly known as:  SYMBICORT Inhale 2 puffs into the lungs 2 (two) times daily.   diphenhydrAMINE 25 MG tablet Commonly known as:  BENADRYL Take  25 mg by mouth every 6 (six) hours as needed for itching.   escitalopram 20 MG tablet Commonly known as:  LEXAPRO TAKE 1 TABLET BY MOUTH EVERY DAY   fenofibrate 54 MG tablet TAKE ONE TABLET(54MG ) BY MOUTH TWICE DAILY   multivitamin with minerals tablet Take 1 tablet by mouth daily.   nystatin powder Commonly known as:  MYCOSTATIN/NYSTOP Apply topically 4 (four) times daily.   SPIRIVA HANDIHALER 18 MCG inhalation capsule Generic drug:  tiotropium INHALE 1 CAPSULE VIA HANDIHALER ONCE DAILY AT THE SAME TIME EVERY DAY   Vitamin D (Ergocalciferol) 1.25 MG (50000 UT) Caps capsule Commonly known as:  DRISDOL Take 50,000 Units by mouth every Friday.       Allergies:    Allergies  Allergen Reactions  . Lovastatin Other (See Comments)    REACTION: leg pain  . Statins Other (See Comments)    REACTION: leg cramps, weakness  . Sulfa Antibiotics Hives and Itching  . Codeine Rash  . Niacin Rash    Flushing also   Family History: Family History  Problem Relation Age of Onset  . COPD Brother   . Heart disease Brother   . Diabetes Brother   . Lymphoma Brother   . Alzheimer's disease Brother   . Pancreatic cancer Sister   . Alzheimer's disease Sister   . Heart disease Mother   . Breast cancer Sister   . Leukemia Other   . Kidney disease Neg Hx    Social History:  reports that she quit smoking about 19 years ago. Her smoking use included cigarettes. She has a 45.00 pack-year smoking history. She has never used smokeless tobacco. She reports that she does not drink alcohol or use drugs.  ROS: UROLOGY Frequent Urination?: No Hard to postpone urination?: No Burning/pain with urination?: No Get up at night to urinate?: No Leakage of urine?: No Urine stream starts and stops?: No Trouble starting stream?: No Do you have to strain to urinate?: No Blood in urine?: No Urinary tract infection?: No Sexually transmitted disease?: No Injury to kidneys or bladder?: No Painful intercourse?: No Weak stream?: No Currently pregnant?: No Vaginal bleeding?: Yes Last menstrual period?: n  Gastrointestinal Nausea?: No Vomiting?: No Indigestion/heartburn?: No Diarrhea?: No Constipation?: No  Constitutional Fever: No Night sweats?: No Weight loss?: No Fatigue?: No  Skin Skin rash/lesions?: No Itching?: No  Eyes Blurred vision?: No Double vision?: No  Ears/Nose/Throat Sore throat?: No Sinus problems?: Yes  Hematologic/Lymphatic Swollen glands?: No Easy bruising?: No  Cardiovascular Leg swelling?: No Chest pain?: No  Respiratory Cough?: No Shortness of breath?: Yes  Endocrine Excessive thirst?: No  Musculoskeletal Back pain?:  Yes Joint pain?: No  Neurological Headaches?: No Dizziness?: No  Psychologic Depression?: No Anxiety?: No  Physical Exam: BP (!) 147/68 (BP Location: Left Arm, Patient Position: Sitting, Cuff Size: Normal)   Pulse 78   Ht 5' 2.5" (1.588 m)   Wt 166 lb (75.3 kg)   BMI 29.88 kg/m   Constitutional: Well nourished. Alert and oriented, No acute distress. Head: Normocephalic and  Respiratory: Normal respiratory effort, no increased work of breathing. GU: No CVA tenderness.  No bladder fullness or masses.  Atrophic external genitalia, normal pubic hair distribution, no lesions.  Normal urethral meatus, no prolapse, no discharge. Urethral caruncle noted.  Friable.  No bladder fullness, tenderness or masses. Pale vagina mucosa, poor estrogen effect, no discharge, no lesions, good pelvic support, no cystocele or rectocele noted.  Cervix and uterus are surgically absent.  No  pelvic masses or tenderness noted.  Anus and perineum are without rashes or lesions.    Skin: No rashes, bruises or suspicious lesions. Neurologic: Grossly intact, no focal deficits, moving all 4 extremities. Psychiatric: Normal mood and affect.  Laboratory Data: Labs drawn today. See EPIC  Assessment & Plan:    1. Vaginal Bleeding secondary to Urethral Caruncle CBC drawn today, results pending as patient is a difficult historian and it is difficult to know how much blood she has seen.  Will update patient with results. I explained to the patient that when women go through menopause and her estrogen levels are severely diminished, the mucus barrier is estrogen dependent and is lost causing vaginal atrophy. Given this, the associated chronic irritation associated with wiping this area with reduced estrogen effect can cause a urethral caruncle. Patient was given a sample of vaginal estrogen cream (Premarin vaginal cream) and instructed to apply 0.5mg  (pea-sized amount)  just inside the vaginal introitus with a finger-tip  every night for two weeks and then on Monday, Wednesday and Friday nights.  I explained to the patient that vaginally administered estrogen, which causes only a slight increase in the blood estrogen levels, have fewer contraindications and adverse systemic effects that oral HT. She will follow-up in 3 weeks for exam and symptom recheck.  Return in about 3 weeks (around 08/23/2018) for recheck.  Laneta Simmers   [facility], [place] [address], [place] [address] [phone]  I, Temidayo Atanda-[place]leye , am acting as a Education administrator for Nori Riis, PA-C  I have reviewed the above documentation for accuracy and completeness, and I agree with the above.    Zara Council, PA-C

## 2018-08-03 LAB — CBC WITH DIFFERENTIAL/PLATELET
Basophils Absolute: 0.1 10*3/uL (ref 0.0–0.2)
Basos: 1 %
EOS (ABSOLUTE): 0.2 10*3/uL (ref 0.0–0.4)
EOS: 3 %
HEMATOCRIT: 33.9 % — AB (ref 34.0–46.6)
HEMOGLOBIN: 11.4 g/dL (ref 11.1–15.9)
IMMATURE GRANS (ABS): 0 10*3/uL (ref 0.0–0.1)
Immature Granulocytes: 0 %
LYMPHS: 44 %
Lymphocytes Absolute: 3.3 10*3/uL — ABNORMAL HIGH (ref 0.7–3.1)
MCH: 30.5 pg (ref 26.6–33.0)
MCHC: 33.6 g/dL (ref 31.5–35.7)
MCV: 91 fL (ref 79–97)
Monocytes Absolute: 0.8 10*3/uL (ref 0.1–0.9)
Monocytes: 10 %
NEUTROS ABS: 3.2 10*3/uL (ref 1.4–7.0)
Neutrophils: 42 %
PLATELETS: 276 10*3/uL (ref 150–450)
RBC: 3.74 x10E6/uL — ABNORMAL LOW (ref 3.77–5.28)
RDW: 13.3 % (ref 12.3–15.4)
WBC: 7.6 10*3/uL (ref 3.4–10.8)

## 2018-08-06 ENCOUNTER — Telehealth: Payer: Self-pay | Admitting: Family Medicine

## 2018-08-06 DIAGNOSIS — N184 Chronic kidney disease, stage 4 (severe): Secondary | ICD-10-CM

## 2018-08-06 DIAGNOSIS — E782 Mixed hyperlipidemia: Secondary | ICD-10-CM

## 2018-08-06 DIAGNOSIS — E0843 Diabetes mellitus due to underlying condition with diabetic autonomic (poly)neuropathy: Secondary | ICD-10-CM

## 2018-08-06 NOTE — Telephone Encounter (Signed)
-----   Message from Eustace Pen, LPN sent at 72/90/2111  3:39 PM EST ----- Regarding: Labs 12/18 Lab orders needed. Thank you.  Insurance:  [employer]

## 2018-08-06 NOTE — Telephone Encounter (Signed)
-----   Message from Eustace Pen, LPN sent at 49/97/1820  3:39 PM EST ----- Regarding: Labs 12/18 Lab orders needed. Thank you.  Insurance:  [employer]

## 2018-08-08 ENCOUNTER — Ambulatory Visit (INDEPENDENT_AMBULATORY_CARE_PROVIDER_SITE_OTHER): Payer: Medicare Other

## 2018-08-08 ENCOUNTER — Other Ambulatory Visit: Payer: Self-pay

## 2018-08-08 VITALS — BP 122/68 | HR 79 | Temp 98.2°F | Ht 62.5 in | Wt 167.2 lb

## 2018-08-08 DIAGNOSIS — E0843 Diabetes mellitus due to underlying condition with diabetic autonomic (poly)neuropathy: Secondary | ICD-10-CM | POA: Diagnosis not present

## 2018-08-08 DIAGNOSIS — Z0000 Encounter for general adult medical examination without abnormal findings: Secondary | ICD-10-CM | POA: Diagnosis not present

## 2018-08-08 LAB — LIPID PANEL
Cholesterol: 204 mg/dL — ABNORMAL HIGH (ref 0–200)
HDL: 61.5 mg/dL (ref >39.00)
LDL Cholesterol: 117 mg/dL — ABNORMAL HIGH (ref 0–99)
NonHDL: 142.99
Total CHOL/HDL Ratio: 3
Triglycerides: 128 mg/dL (ref 0.0–149.0)
VLDL: 25.6 mg/dL (ref 0.0–40.0)

## 2018-08-08 LAB — COMPREHENSIVE METABOLIC PANEL
ALT: 10 U/L (ref 0–35)
AST: 21 U/L (ref 0–37)
Albumin: 3.7 g/dL (ref 3.5–5.2)
Alkaline Phosphatase: 39 U/L (ref 39–117)
BUN: 34 mg/dL — AB (ref 6–23)
CHLORIDE: 103 meq/L (ref 96–112)
CO2: 30 mEq/L (ref 19–32)
Calcium: 10.1 mg/dL (ref 8.4–10.5)
Creatinine, Ser: 2.15 mg/dL — ABNORMAL HIGH (ref 0.40–1.20)
GFR: 23.18 mL/min — ABNORMAL LOW (ref >60.00)
Glucose, Bld: 151 mg/dL — ABNORMAL HIGH (ref 70–99)
POTASSIUM: 4.5 meq/L (ref 3.5–5.1)
SODIUM: 140 meq/L (ref 135–145)
Total Bilirubin: 0.4 mg/dL (ref 0.2–1.2)
Total Protein: 6.5 g/dL (ref 6.0–8.3)

## 2018-08-08 LAB — HEMOGLOBIN A1C: HEMOGLOBIN A1C: 6.2 % (ref 4.6–6.5)

## 2018-08-08 NOTE — Progress Notes (Signed)
PCP notes:   Health maintenance:  Foot exam - PCP follow-up needed A1C - completed Microalbumin - pt will bring specimen to CPE appt  Abnormal screenings:   Hearing - failed  Hearing Screening   125Hz  250Hz  500Hz  1000Hz  2000Hz  3000Hz  4000Hz  6000Hz  8000Hz   Right ear:   40 40 40  0    Left ear:   40 40 40  0     Mini-Cog score: 19/20 MMSE - Mini Mental State Exam 08/08/2018 07/03/2017 06/30/2016  Orientation to time 5 5 5   Orientation to Place 5 5 5   Registration 3 3 3   Attention/ Calculation 0 0 0  Recall 2 3 3   Recall-comments unable to recall 1 of 3 words - -  Language- name 2 objects 0 0 0  Language- repeat 1 1 1   Language- follow 3 step command 3 3 3   Language- read & follow direction 0 0 0  Write a sentence 0 0 0  Copy design 0 0 0  Total score 19 20 20     Patient concerns:   Refill request for Drisdol sent to PCP.   Nurse concerns:  None  Next PCP appt:   08/13/18 @ 1400

## 2018-08-08 NOTE — Progress Notes (Signed)
I reviewed health advisor's note, was available for consultation, and agree with documentation and plan.   Signed,  Karo Rog T. Elijan Googe, MD  

## 2018-08-08 NOTE — Progress Notes (Signed)
Subjective:   Jill [name] is a 84 y.o. female who presents for Medicare Annual (Subsequent) preventive examination.  Review of Systems:  N/A Cardiac Risk Factors include: advanced age (>52men, >47 women);obesity (BMI >30kg/m2);dyslipidemia;hypertension     Objective:     Vitals: BP 122/68 (BP Location: Right Arm, Patient Position: Sitting, Cuff Size: Normal)   Pulse 79   Temp 98.2 F (36.8 C) (Oral)   Ht 5' 2.5" (1.588 m) Comment: shoes  Wt 167 lb 4 oz (75.9 kg)   SpO2 95%   BMI 30.10 kg/m   Body mass index is 30.1 kg/m.  Advanced Directives 08/08/2018 05/31/2018 07/15/2017 07/03/2017 08/08/2016 06/30/2016 02/16/2015  Does Patient Have a Medical Advance Directive? No No - Yes No Yes No  Type of Advance Directive - - [place];Living will [place];Living will - [place];Living will -  Does patient want to make changes to medical advance directive? - - - - - No - Patient declined -  Copy of [place] in Chart? - - - No - copy requested - No - copy requested -  Would patient like information on creating a medical advance directive? Yes (MAU/Ambulatory/Procedural Areas - Information given) No - Patient declined - - No - Patient declined - Yes - Educational materials given    Tobacco Social History   Tobacco Use  Smoking Status Former Smoker  . Packs/day: 1.00  . Years: 45.00  . Pack years: 45.00  . Types: Cigarettes  . Last attempt to quit: 06/16/1999  . Years since quitting: 19.1  Smokeless Tobacco Never Used  Tobacco Comment   quit 15 years ago     Counseling given: No Comment: quit 15 years ago   Clinical Intake:  Pre-visit preparation completed: Yes  Pain : No/denies pain Pain Score: 0-No pain     Nutritional Status: BMI > 30  Obese Nutritional Risks: None Diabetes: Yes CBG done?: No Did pt. bring in CBG monitor from home?: No  How often do you need to have someone help  you when you read instructions, pamphlets, or other written materials from your doctor or pharmacy?: 2 - Rarely What is the last grade level you completed in school?: Master degree  Interpreter Needed?: No  Comments: pt is a widow and lives alone Information entered by :: LPinson, LPN  Past Medical History:  Diagnosis Date  . Allergy   . Arthritis    osteoarthritis   . Chronic kidney disease   . Complication of anesthesia    difficult waking   . Depression   . Diabetes mellitus   . Diverticula, colon   . Family history of anesthesia complication    Son is difficult intubation  . Hyperlipidemia   . Hypertension   . Vasovagal syncope 2006   Negative cardiac workup-myoview, echo   Past Surgical History:  Procedure Laterality Date  . ABDOMINAL HYSTERECTOMY  1977   fibroid  . JOINT REPLACEMENT  2009   rt hip  . TOTAL HIP ARTHROPLASTY Left 05/12/2014   dr Mayer Camel  . TOTAL HIP ARTHROPLASTY Left 05/12/2014   Procedure: TOTAL HIP ARTHROPLASTY;  Surgeon: Kerin Salen, MD;  Location: [facility];  Service: Orthopedics;  Laterality: Left;   Family History  Problem Relation Age of Onset  . COPD Brother   . Heart disease Brother   . Diabetes Brother   . Lymphoma Brother   . Alzheimer's disease Brother   . Pancreatic cancer Sister   .  Alzheimer's disease Sister   . Heart disease Mother   . Breast cancer Sister   . Leukemia Other   . Kidney disease Neg Hx    Social History   Socioeconomic History  . Marital status: Widowed    Spouse name: Not on file  . Number of children: Not on file  . Years of education: Not on file  . Highest education level: Not on file  Occupational History  . Not on file  Social Needs  . Financial resource strain: Not on file  . Food insecurity:    Worry: Not on file    Inability: Not on file  . Transportation needs:    Medical: Not on file    Non-medical: Not on file  Tobacco Use  . Smoking status: Former Smoker    Packs/day: 1.00    Years:  45.00    Pack years: 45.00    Types: Cigarettes    Last attempt to quit: 06/16/1999    Years since quitting: 19.1  . Smokeless tobacco: Never Used  . Tobacco comment: quit 15 years ago  Substance and Sexual Activity  . Alcohol use: No    Alcohol/week: 0.0 standard drinks  . Drug use: No  . Sexual activity: Not Currently  Lifestyle  . Physical activity:    Days per week: Not on file    Minutes per session: Not on file  . Stress: Not on file  Relationships  . Social connections:    Talks on phone: Not on file    Gets together: Not on file    Attends religious service: Not on file    Active member of club or organization: Not on file    Attends meetings of clubs or organizations: Not on file    Relationship status: Not on file  Other Topics Concern  . 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 Diet: healthy diet      Son Antony Haste [place]) [place] , has living will and DNR ( reviewed 2015)                Outpatient Encounter Medications as of 08/08/2018  Medication Sig  . albuterol (PROVENTIL HFA;VENTOLIN HFA) 108 (90 Base) MCG/ACT inhaler Inhale 2 puffs into the lungs every 4 (four) hours as needed for wheezing or shortness of breath.  . ALPRAZolam (XANAX) 0.25 MG tablet Take 1 tablet (0.25 mg total) by mouth 2 (two) times daily as needed for anxiety.  Marland Kitchen BIOTIN PO Take 1 tablet by mouth daily.  . budesonide-formoterol (SYMBICORT) 160-4.5 MCG/ACT inhaler Inhale 2 puffs into the lungs 2 (two) times daily.  . diphenhydrAMINE (BENADRYL) 25 MG tablet Take 25 mg by mouth every 6 (six) hours as needed for itching.  . escitalopram (LEXAPRO) 20 MG tablet TAKE 1 TABLET BY MOUTH EVERY DAY  . fenofibrate 54 MG tablet TAKE ONE  TABLET(54MG ) BY MOUTH TWICE DAILY  . Multiple Vitamins-Minerals (MULTIVITAMIN WITH MINERALS) tablet Take 1 tablet by mouth daily.  Marland Kitchen nystatin (MYCOSTATIN/NYSTOP) powder Apply topically 4 (four) times daily.  Marland Kitchen SPIRIVA HANDIHALER 18 MCG inhalation capsule INHALE 1 CAPSULE VIA HANDIHALER ONCE DAILY AT THE SAME TIME EVERY DAY  . Vitamin D, Ergocalciferol, (DRISDOL) 50000 units CAPS  capsule Take 50,000 Units by mouth every Friday.    No facility-administered encounter medications on file as of 08/08/2018.     Activities of Daily Living In your present state of health, do you have any difficulty performing the following activities: 08/08/2018 05/31/2018  Hearing? N N  Vision? N N  Difficulty concentrating or making decisions? Y N  Walking or climbing stairs? Y Y  Dressing or bathing? N N  Doing errands, shopping? Tempie Donning  Preparing Food and eating ? N -  Using the Toilet? N -  In the past six months, have you accidently leaked urine? N -  Do you have problems with loss of bowel control? N -  Managing your Medications? Y -  Managing your Finances? N -  Housekeeping or managing your Housekeeping? N -  Some recent data might be hidden    Patient Care Team: Jinny Sanders, MD as PCP - General (Family Medicine)    Assessment:   This is a routine wellness examination for [place].   Hearing Screening   125Hz  250Hz  500Hz  1000Hz  2000Hz  3000Hz  4000Hz  6000Hz  8000Hz   Right ear:   40 40 40  0    Left ear:   40 40 40  0    Vision Screening Comments: Vision exam in Feb 2019   Exercise Activities and Dietary recommendations Current Exercise Habits: Home exercise routine, Type of exercise: walking, Time (Minutes): 10, Frequency (Times/Week): 7, Weekly Exercise (Minutes/Week): 70, Intensity: Mild, Exercise limited by: orthopedic condition(s)  Goals    . Patient Stated     Starting 08/08/18, I will continue to take medications as prescribed.        Fall Risk Fall Risk  08/08/2018 07/03/2017  06/30/2016 06/02/2015 02/20/2014  Falls in the past year? 0 Yes Yes No Yes  Comment - 2-3 falls in past year due to feet "getting tangled up" - - -  Number falls in past yr: - 2 or more 1 - 2 or more  Injury with Fall? - Yes Yes - -  Risk Factor Category  - - - - High Fall Risk  Risk for fall due to : - - Impaired balance/gait - History of fall(s)  Follow up - - Falls evaluation completed - -   Depression Screen PHQ 2/9 Scores 08/08/2018 07/03/2017 06/30/2016 08/21/2015  PHQ - 2 Score 0 0 0 2  PHQ- 9 Score 0 0 - 8     Cognitive Function MMSE - Mini Mental State Exam 08/08/2018 07/03/2017 06/30/2016  Orientation to time 5 5 5   Orientation to Place 5 5 5   Registration 3 3 3   Attention/ Calculation 0 0 0  Recall 2 3 3   Recall-comments unable to recall 1 of 3 words - -  Language- name 2 objects 0 0 0  Language- repeat 1 1 1   Language- follow 3 step command 3 3 3   Language- read & follow direction 0 0 0  Write a sentence 0 0 0  Copy design 0 0 0  Total score 19 20 20        PLEASE NOTE: A Mini-Cog screen was completed. Maximum score is 20. A value of 0 denotes this part of [place]stein MMSE was not completed or the patient failed this part of the Mini-Cog screening.   Mini-Cog Screening Orientation to Time - Max 5 pts Orientation to Place - Max 5 pts Registration - Max 3 pts Recall - Max 3 pts Language Repeat - Max 1 pts Language Follow 3 Step  Command - Max 3 pts   Immunization History  Administered Date(s) Administered  . H1N1 08/18/2008  . Influenza Whole 06/16/2007  . Influenza, High Dose Seasonal PF 07/03/2017, 06/01/2018  . Influenza,inj,Quad PF,6+ Mos 05/14/2014, 05/05/2015, 06/24/2016  . Influenza-Unspecified 06/22/2013  . Pneumococcal Conjugate-13 02/20/2014  . Pneumococcal Polysaccharide-23 01/20/1997, 06/24/2016  . Td 02/20/2003  . Tdap 02/20/2014    Screening Tests Health Maintenance  Topic Date Due  . FOOT EXAM  08/13/2018 (Originally 07/18/2018)  .  OPHTHALMOLOGY EXAM  09/26/2018  . HEMOGLOBIN A1C  02/07/2019  . URINE MICROALBUMIN  08/09/2019  . TETANUS/TDAP  02/21/2024  . INFLUENZA VACCINE  Completed  . DEXA SCAN  Completed  . PNA vac Low Risk Adult  Completed      Plan:     I have personally reviewed, addressed, and noted the following in the patient's chart:  A. Medical and social history B. Use of alcohol, tobacco or illicit drugs  C. Current medications and supplements D. Functional ability and status E.  Nutritional status F.  Physical activity G. Advance directives H. List of other physicians I.  Hospitalizations, surgeries, and ER visits in previous 12 months J.  [place] to include hearing, vision, cognitive, depression L. Referrals and appointments - none  In addition, I have reviewed and discussed with patient certain preventive protocols, quality metrics, and best practice recommendations. A written personalized care plan for preventive services as well as general preventive health recommendations were provided to patient.  See attached scanned questionnaire for additional information.   Signed,   Lindell Noe, MHA, BS, LPN Health Coach

## 2018-08-08 NOTE — Telephone Encounter (Signed)
Patient in office today for AWV. Son Zenia Resides was present. He stated patient needed a refill for Drisdol 50000 caps. Please send to [facility].

## 2018-08-08 NOTE — Patient Instructions (Signed)
Jill [name] , Thank you for taking time to come for your Medicare Wellness Visit. I appreciate your ongoing commitment to your health goals. Please review the following plan we discussed and let me know if I can assist you in the future.   These are the goals we discussed: Goals    . Patient Stated     Starting 08/08/18, I will continue to take medications as prescribed.        This is a list of the screening recommended for you and due dates:  Health Maintenance  Topic Date Due  . Complete foot exam   08/13/2018*  . Eye exam for diabetics  09/26/2018  . Hemoglobin A1C  02/07/2019  . Urine Protein Check  08/09/2019  . Tetanus Vaccine  02/21/2024  . Flu Shot  Completed  . DEXA scan (bone density measurement)  Completed  . Pneumonia vaccines  Completed  *Topic was postponed. The date shown is not the original due date.    Preventive Care for Adults  A healthy lifestyle and preventive care can promote health and wellness. Preventive health guidelines for adults include the following key practices.  . A routine yearly physical is a good way to check with your health care provider about your health and preventive screening. It is a chance to share any concerns and updates on your health and to receive a thorough exam.  . Visit your dentist for a routine exam and preventive care every 6 months. Brush your teeth twice a day and floss once a day. Good oral hygiene prevents tooth decay and gum disease.  . The frequency of eye exams is based on your age, health, family medical history, use  of contact lenses, and other factors. Follow your health care provider's recommendations for frequency of eye exams.  . Eat a healthy diet. Foods like vegetables, fruits, whole grains, low-fat dairy products, and lean protein foods contain the nutrients you need without too many calories. Decrease your intake of foods high in solid fats, added sugars, and salt. Eat the right amount of calories for you.  Get information about a proper diet from your health care provider, if necessary.  . Regular physical exercise is one of the most important things you can do for your health. Most adults should get at least 150 minutes of moderate-intensity exercise (any activity that increases your heart rate and causes you to sweat) each week. In addition, most adults need muscle-strengthening exercises on 2 or more days a week.  Silver Sneakers may be a benefit available to you. To determine eligibility, you may visit the website: www.silversneakers.com or contact program at 314 438 4206 Mon-Fri between 8AM-8PM.   . Maintain a healthy weight. The body mass index (BMI) is a screening tool to identify possible weight problems. It provides an estimate of body fat based on height and weight. Your health care provider can find your BMI and can help you achieve or maintain a healthy weight.   For adults 20 years and older: ? A BMI below 18.5 is considered underweight. ? A BMI of 18.5 to 24.9 is normal. ? A BMI of 25 to 29.9 is considered overweight. ? A BMI of 30 and above is considered obese.   . Maintain normal blood lipids and cholesterol levels by exercising and minimizing your intake of saturated fat. Eat a balanced diet with plenty of fruit and vegetables. Blood tests for lipids and cholesterol should begin at age 87 and be repeated every 5 years. If your  lipid or cholesterol levels are high, you are over 50, or you are at high risk for heart disease, you may need your cholesterol levels checked more frequently. Ongoing high lipid and cholesterol levels should be treated with medicines if diet and exercise are not working.  . If you smoke, find out from your health care provider how to quit. If you do not use tobacco, please do not start.  . If you choose to drink alcohol, please do not consume more than 2 drinks per day. One drink is considered to be 12 ounces (355 mL) of beer, 5 ounces (148 mL) of wine, or  1.5 ounces (44 mL) of liquor.  . If you are 39-25 years old, ask your health care provider if you should take aspirin to prevent strokes.  . Use sunscreen. Apply sunscreen liberally and repeatedly throughout the day. You should seek shade when your shadow is shorter than you. Protect yourself by wearing long sleeves, pants, a wide-brimmed hat, and sunglasses year round, whenever you are outdoors.  . Once a month, do a whole body skin exam, using a mirror to look at the skin on your back. Tell your health care provider of new moles, moles that have irregular borders, moles that are larger than a pencil eraser, or moles that have changed in shape or color.

## 2018-08-10 MED ORDER — VITAMIN D (ERGOCALCIFEROL) 1.25 MG (50000 UNIT) PO CAPS: 50000.0000 [IU] | ORAL_CAPSULE | ORAL | 3 refills | Status: DC

## 2018-08-13 ENCOUNTER — Ambulatory Visit (INDEPENDENT_AMBULATORY_CARE_PROVIDER_SITE_OTHER): Payer: Medicare Other | Admitting: Family Medicine

## 2018-08-13 ENCOUNTER — Encounter: Payer: Self-pay | Admitting: Family Medicine

## 2018-08-13 ENCOUNTER — Encounter: Payer: Medicare Other | Admitting: Family Medicine

## 2018-08-13 VITALS — BP 120/78 | HR 87 | Temp 97.8°F | Ht 63.0 in | Wt 167.0 lb

## 2018-08-13 DIAGNOSIS — N184 Chronic kidney disease, stage 4 (severe): Secondary | ICD-10-CM

## 2018-08-13 DIAGNOSIS — M10371 Gout due to renal impairment, right ankle and foot: Secondary | ICD-10-CM

## 2018-08-13 DIAGNOSIS — Z0000 Encounter for general adult medical examination without abnormal findings: Secondary | ICD-10-CM | POA: Diagnosis not present

## 2018-08-13 DIAGNOSIS — E0843 Diabetes mellitus due to underlying condition with diabetic autonomic (poly)neuropathy: Secondary | ICD-10-CM | POA: Diagnosis not present

## 2018-08-13 DIAGNOSIS — E782 Mixed hyperlipidemia: Secondary | ICD-10-CM

## 2018-08-13 DIAGNOSIS — E1143 Type 2 diabetes mellitus with diabetic autonomic (poly)neuropathy: Secondary | ICD-10-CM

## 2018-08-13 DIAGNOSIS — E118 Type 2 diabetes mellitus with unspecified complications: Secondary | ICD-10-CM

## 2018-08-13 DIAGNOSIS — I10 Essential (primary) hypertension: Secondary | ICD-10-CM

## 2018-08-13 DIAGNOSIS — J449 Chronic obstructive pulmonary disease, unspecified: Secondary | ICD-10-CM

## 2018-08-13 DIAGNOSIS — I5032 Chronic diastolic (congestive) heart failure: Secondary | ICD-10-CM

## 2018-08-13 DIAGNOSIS — D631 Anemia in chronic kidney disease: Secondary | ICD-10-CM

## 2018-08-13 DIAGNOSIS — F3341 Major depressive disorder, recurrent, in partial remission: Secondary | ICD-10-CM

## 2018-08-13 LAB — HM DIABETES FOOT EXAM

## 2018-08-13 MED ORDER — PREDNISONE 10 MG PO TABS: ORAL_TABLET | ORAL | 0 refills | Status: DC

## 2018-08-13 NOTE — Assessment & Plan Note (Signed)
Stable, not bothersome

## 2018-08-13 NOTE — Assessment & Plan Note (Signed)
Lab Results  Component Value Date   LABURIC 7.3 (H) 12/20/2016     Work on low uric acid diet.  given pred taper for flare to use prn.

## 2018-08-13 NOTE — Assessment & Plan Note (Signed)
Good control on lexapro.

## 2018-08-13 NOTE — Assessment & Plan Note (Signed)
Good control with diet. Due for microalbumin.

## 2018-08-13 NOTE — Assessment & Plan Note (Signed)
Tolerable control  At age > 38

## 2018-08-13 NOTE — Assessment & Plan Note (Signed)
Diet controlled.  

## 2018-08-13 NOTE — Assessment & Plan Note (Signed)
Euvolemic in office today 

## 2018-08-13 NOTE — Progress Notes (Signed)
Subjective:    Patient ID: Jill [name], female    DOB: 04/16/1932, 84 y.o.   MRN: [id number]  HPI  The patient presents for annual medicare wellness, complete physical and review of chronic health problems. He/She also has the following acute concerns today:  Health maintenance:  Foot exam - PCP follow-up needed A1C - completed Microalbumin - pt will bring specimen to CPE appt  Abnormal screenings:   Hearing - failed             Hearing Screening   125Hz  250Hz  500Hz  1000Hz  2000Hz  3000Hz  4000Hz  6000Hz  8000Hz   Right ear:   40 40 40  0    Left ear:   40 40 40  0     Mini-Cog score: 19/20 MMSE - Mini Mental State Exam 08/08/2018 07/03/2017 06/30/2016  Orientation to time 5 5 5   Orientation to Place 5 5 5   Registration 3 3 3   Attention/ Calculation 0 0 0  Recall 2 3 3   Recall-comments unable to recall 1 of 3 words - -  Language- name 2 objects 0 0 0  Language- repeat 1 1 1   Language- follow 3 step command 3 3 3   Language- read & follow direction 0 0 0  Write a sentence 0 0 0  Copy design 0 0 0  Total score 19 20 20     Patient concerns:   Refill request for Drisdol sent to PCP.   08/13/18 Today    In last 4 -6 days pain increasing in right  Great toe.. slight redness  No swelling.  Feels like beginning of gout.  Admitted for PNA in 05/2018   Seen 12/9 by Dr. Glori Bickers for vaginal bleeding.. referred to GYN  GYN referred to Dr.  Anitra Lauth URO.. urethral caruncle.. treated with topical estrogen. Has follow up in 3 weeks. S/P hysterectomy  No further bleeding.. but some itching. No discharge.  Hypertension:   At goal on current regimen.   BP Readings from Last 3 Encounters:  08/13/18 120/78  08/08/18 122/68  08/02/18 (!) 147/68  Using medication without problems or lightheadedness:  none Chest pain with exertion: none Edema: stable Short of breath: stable Average home BPs: Other issues:  Hg improved at 11.4 up from 9.3, 2 months  ago.  Elevated Cholesterol:  On fenofibrate Lab Results  Component Value Date   CHOL 204 (H) 08/08/2018   HDL 61.50 08/08/2018   LDLCALC 117 (H) 08/08/2018   LDLDIRECT 122.0 07/03/2017   TRIG 128.0 08/08/2018   CHOLHDL 3 08/08/2018  Using medications without problems: Muscle aches:  Diet compliance: healthy eating.Marland Kitchen trying to eat 3 meals a day Exercise:  Little bit of walking. Other complaints:   Wt Readings from Last 3 Encounters:  08/13/18 167 lb (75.8 kg)  08/08/18 167 lb 4 oz (75.9 kg)  08/02/18 166 lb (75.3 kg)    Mild COPD: on spiriva, Symbicort, albuterol prn  Diabetes:  Good control on  Diet control Lab Results  Component Value Date   HGBA1C 6.2 08/08/2018  Using medications without difficulties: Hypoglycemic episodes: Hyperglycemic episodes: Feet problems: no ulcers Blood Sugars averaging: eye exam within last year: 09/2017  CKD: Followed by nephrology  GFR 23-27 at  New baseline.   MDD, recurrent: on lexapro Depression screen [facility] 2/9 08/08/2018 07/03/2017 06/30/2016  Decreased Interest 0 0 0  Down, Depressed, Hopeless 0 0 0  PHQ - 2 Score 0 0 0  Altered sleeping 0 0 -  Tired, decreased energy 0 0 -  Change in appetite 0 0 -  Feeling bad or failure about yourself  0 0 -  Trouble concentrating 0 0 -  Moving slowly or fidgety/restless 0 0 -  Suicidal thoughts 0 0 -  PHQ-9 Score 0 0 -  Difficult doing work/chores Not difficult at all Not difficult at all -     Social History /Family History/Past Medical History reviewed in detail and updated in EMR if needed. Blood pressure 120/78, pulse 87, temperature 97.8 F (36.6 C), temperature source Oral, height 5\' 3"  (1.6 m), weight 167 lb (75.8 kg), SpO2 96 %.   Review of Systems  Constitutional: Negative for fatigue and fever.  HENT: Negative for congestion.   Eyes: Negative for pain.  Respiratory: Negative for cough and shortness of breath.   Cardiovascular: Negative for chest pain, palpitations and leg  swelling.  Gastrointestinal: Negative for abdominal pain.  Genitourinary: Negative for dysuria and vaginal bleeding.  Musculoskeletal: Negative for back pain.  Neurological: Negative for syncope, light-headedness and headaches.  Psychiatric/Behavioral: Negative for dysphoric mood.       Objective:   Physical Exam Constitutional:      General: She is not in acute distress.    Appearance: Normal appearance. She is well-developed. She is not ill-appearing or toxic-appearing.  HENT:     Head: Normocephalic.     Right Ear: Hearing, tympanic membrane, ear canal and external ear normal. Tympanic membrane is not erythematous, retracted or bulging.     Left Ear: Hearing, tympanic membrane, ear canal and external ear normal. Tympanic membrane is not erythematous, retracted or bulging.     Nose: No mucosal edema or rhinorrhea.     Right Sinus: No maxillary sinus tenderness or frontal sinus tenderness.     Left Sinus: No maxillary sinus tenderness or frontal sinus tenderness.     Mouth/Throat:     Pharynx: Uvula midline.  Eyes:     General: Lids are normal. Lids are everted, no foreign bodies appreciated.     Conjunctiva/sclera: Conjunctivae normal.     Pupils: Pupils are equal, round, and reactive to light.  Neck:     Musculoskeletal: Normal range of motion and neck supple.     Thyroid: No thyroid mass or thyromegaly.     Vascular: No carotid bruit.     Trachea: Trachea normal.  Cardiovascular:     Rate and Rhythm: Normal rate and regular rhythm.     Pulses: Normal pulses.     Heart sounds: Normal heart sounds, S1 normal and S2 normal. No murmur. No friction rub. No gallop.   Pulmonary:     Effort: Pulmonary effort is normal. No tachypnea or respiratory distress.     Breath sounds: Normal breath sounds. No decreased breath sounds, wheezing, rhonchi or rales.  Abdominal:     General: Bowel sounds are normal.     Palpations: Abdomen is soft.     Tenderness: There is no abdominal  tenderness.  Feet:     Comments: Mild ttp in right great toe, no redness or swelling Skin:    General: Skin is warm and dry.     Findings: No rash.  Neurological:     Mental Status: She is alert.  Psychiatric:        Mood and Affect: Mood is not anxious or depressed.        Speech: Speech normal.        Behavior: Behavior normal. Behavior is cooperative.        Thought Content: Thought  content normal.        Judgment: Judgment normal.      Diabetic foot exam: Normal inspection No skin breakdown No calluses  Normal DP pulses decreased sensation to light touch and monofilament Nails normal      Assessment & Plan:  The patient's preventative maintenance and recommended screening tests for an annual wellness exam were reviewed in full today. Brought up to date unless services declined.  Counselled on the importance of diet, exercise, and its role in overall health and mortality. The patient's FH and SH was reviewed, including their home life, tobacco status, and drug and alcohol status.   Reviewed vaccines  no indication for colonoscopy, mammogram, PAP/DVE  DEXA: refused. Last normal in 2009.

## 2018-08-13 NOTE — Patient Instructions (Addendum)
If gout flare progressing.. can use prednisone taper to treat.  Stop at lab to give urine sample for microalbumin.  Low-Purine Eating Plan A low-purine eating plan involves making food choices to limit your intake of purine. Purine is a kind of uric acid. Too much uric acid in your blood can cause certain conditions, such as gout and kidney stones. Eating a low-purine diet can help control these conditions. What are tips for following this plan? Reading food labels   Avoid foods with saturated or Trans fat.  Check the ingredient list of grains-based foods, such as bread and cereal, to make sure that they contain whole grains.  Check the ingredient list of sauces or soups to make sure they do not contain meat or fish.  When choosing soft drinks, check the ingredient list to make sure they do not contain high-fructose corn syrup. Shopping  Buy plenty of fresh fruits and vegetables.  Avoid buying canned or fresh fish.  Buy dairy products labeled as low-fat or nonfat.  Avoid buying premade or processed foods. These foods are often high in fat, salt (sodium), and added sugar. Cooking  Use olive oil instead of butter when cooking. Oils like olive oil, canola oil, and sunflower oil contain healthy fats. Meal planning  Learn which foods do or do not affect you. If you find out that a food tends to cause your gout symptoms to flare up, avoid eating that food. You can enjoy foods that do not cause problems. If you have any questions about a food item, talk with your dietitian or health care provider.  Limit foods high in fat, especially saturated fat. Fat makes it harder for your body to get rid of uric acid.  Choose foods that are lower in fat and are lean sources of protein. General guidelines  Limit alcohol intake to no more than 1 drink a day for nonpregnant women and 2 drinks a day for men. One drink equals 12 oz of beer, 5 oz of wine, or 1 oz of hard liquor. Alcohol can affect the  way your body gets rid of uric acid.  Drink plenty of water to keep your urine clear or pale yellow. Fluids can help remove uric acid from your body.  If directed by your health care provider, take a vitamin C supplement.  Work with your health care provider and dietitian to develop a plan to achieve or maintain a healthy weight. Losing weight can help reduce uric acid in your blood. What foods are recommended? The items listed may not be a complete list. Talk with your dietitian about what dietary choices are best for you. Foods low in purines Foods low in purines do not need to be limited. These include:  All fruits.  All low-purine vegetables, pickles, and olives.  Breads, pasta, rice, cornbread, and popcorn. Cake and other baked goods.  All dairy foods.  Eggs, nuts, and nut butters.  Spices and condiments, such as salt, herbs, and vinegar.  Plant oils, butter, and margarine.  Water, sugar-free soft drinks, tea, coffee, and cocoa.  Vegetable-based soups, broths, sauces, and gravies. Foods moderate in purines Foods moderate in purines should be limited to the amounts listed.   cup of asparagus, cauliflower, spinach, mushrooms, or green peas, each day.  2/3 cup uncooked oatmeal, each day.   cup dry wheat bran or wheat germ, each day.  2-3 ounces of meat or poultry, each day.  4-6 ounces of shellfish, such as crab, lobster, oysters, or shrimp,  each day.  1 cup cooked beans, peas, or lentils, each day.  Soup, broths, or bouillon made from meat or fish. Limit these foods as much as possible. What foods are not recommended? The items listed may not be a complete list. Talk with your dietitian about what dietary choices are best for you. Limit your intake of foods high in purines, including:  Beer and other alcohol.  Meat-based gravy or sauce.  Canned or fresh fish, such as: ? Anchovies, sardines, herring, and tuna. ? Mussels and scallops. ? Codfish, trout, and  haddock.  Berniece Salines.  Organ meats, such as: ? Liver or kidney. ? Tripe. ? Sweetbreads (thymus gland or pancreas).  Wild Clinical biochemist.  Yeast or yeast extract supplements.  Drinks sweetened with high-fructose corn syrup. Summary  Eating a low-purine diet can help control conditions caused by too much uric acid in the body, such as gout or kidney stones.  Choose low-purine foods, limit alcohol, and limit foods high in fat.  You will learn over time which foods do or do not affect you. If you find out that a food tends to cause your gout symptoms to flare up, avoid eating that food. This information is not intended to replace advice given to you by your health care provider. Make sure you discuss any questions you have with your health care provider. Document Released: 12/03/2010 Document Revised: 09/21/2016 Document Reviewed: 09/21/2016 Elsevier Interactive Patient Education  2019 [employer].

## 2018-08-13 NOTE — Assessment & Plan Note (Signed)
Improved

## 2018-08-13 NOTE — Assessment & Plan Note (Signed)
Stable control on spiriva, symbicort and albuterol prn.

## 2018-08-13 NOTE — Addendum Note (Signed)
Addended by: Ellamae Sia on: 08/13/2018 03:11 PM   Modules accepted: Orders

## 2018-08-13 NOTE — Assessment & Plan Note (Signed)
Well controlled. Continue current medication.  

## 2018-08-13 NOTE — Assessment & Plan Note (Signed)
Stable. Followed by nephrology. 

## 2018-08-14 LAB — MICROALBUMIN / CREATININE URINE RATIO
CREATININE, U: 86.1 mg/dL
MICROALB UR: 3.5 mg/dL — AB (ref 0.0–1.9)
Microalb Creat Ratio: 4.1 mg/g (ref 0.0–30.0)

## 2018-09-05 ENCOUNTER — Ambulatory Visit: Payer: Medicare Other | Admitting: Urology

## 2018-09-05 ENCOUNTER — Encounter: Payer: Self-pay | Admitting: Urology

## 2018-09-05 VITALS — BP 143/68 | HR 70 | Ht 66.0 in | Wt 165.0 lb

## 2018-09-05 DIAGNOSIS — N952 Postmenopausal atrophic vaginitis: Secondary | ICD-10-CM

## 2018-09-05 DIAGNOSIS — N362 Urethral caruncle: Secondary | ICD-10-CM

## 2018-09-05 NOTE — Progress Notes (Signed)
09/05/2018  1:29 PM   Jill [name] 08-11-1932 [id number]  Referring provider: Jinny Sanders, MD [address] [place],  [address]  Chief Complaint  Patient presents with  . Follow-up   HPI: Patient is an 85 year old Caucasian female who has a history of a renal abscess, recurrent UTI, vaginal atrophy and incomplete bladder emptying who presents today a 3 week f/u for the evaluation and management of vaginal bleeding.   She reports of nocturia and denies vaginal bleeding. She has been using the vaginal estrogen cream as instructed except for the last week.   Background History: She presented on 08/02/2018 for vaginal bleeding. She was referred here by her GYN and PCP. She noticed vaginal bleeding starting 2-3 days ago. She went to urinate and noticed "bright red" blood when she wiped as far as she can remember. She reports it occurred multiple time throughout the day then through that night. It has tappered off but she noticed spotting yesterday. Denies noting it this morning. Denies dysuria and suprapubic/flank pain. She denies nausea, vomiting or diarrhea. She was seen by Dr. Glori Bickers on 07/30/2018 and was noted to have old blood in her vaginal canal and an urethral caruncle.  She was then referred to her gynecologist, Dr. Everlene Farrier, and a pelvic exam was performed on 08/01/2018.  She was noted to have vaginal atrophy, a small amount of bright red blood in the introitus and a friable urethral caruncle.   She has had a hysterectomy.   History of renal abscess An MRI done in 06/2015 noted that the renal abscess resolved.   History of recurrent UTI Risk factors for rUTI's: age and vaginal atrophy  PMH: Past Medical History:  Diagnosis Date  . Allergy   . Arthritis    osteoarthritis   . Chronic kidney disease   . Complication of anesthesia    difficult waking   . Depression   . Diabetes mellitus   . Diverticula, colon   . Family history of anesthesia complication     Son is difficult intubation  . Hyperlipidemia   . Hypertension   . Vasovagal syncope 2006   Negative cardiac workup-myoview, echo    Surgical History: Past Surgical History:  Procedure Laterality Date  . ABDOMINAL HYSTERECTOMY  1977   fibroid  . JOINT REPLACEMENT  2009   rt hip  . TOTAL HIP ARTHROPLASTY Left 05/12/2014   dr Mayer Camel  . TOTAL HIP ARTHROPLASTY Left 05/12/2014   Procedure: TOTAL HIP ARTHROPLASTY;  Surgeon: Kerin Salen, MD;  Location: [facility];  Service: Orthopedics;  Laterality: Left;   Home Medications:  Allergies as of 09/05/2018      Reactions   Lovastatin Other (See Comments)   REACTION: leg pain   Statins Other (See Comments)   REACTION: leg cramps, weakness   Sulfa Antibiotics Hives, Itching   Codeine Rash   Niacin Rash   Flushing also      Medication List       Accurate as of September 05, 2018  1:29 PM. Always use your most recent med list.        albuterol 108 (90 Base) MCG/ACT inhaler Commonly known as:  PROVENTIL HFA;VENTOLIN HFA Inhale 2 puffs into the lungs every 4 (four) hours as needed for wheezing or shortness of breath.   ALPRAZolam 0.25 MG tablet Commonly known as:  XANAX Take 1 tablet (0.25 mg total) by mouth 2 (two) times daily as needed for anxiety.   budesonide-formoterol 160-4.5 MCG/ACT inhaler  Commonly known as:  SYMBICORT Inhale 2 puffs into the lungs 2 (two) times daily.   diphenhydrAMINE 25 MG tablet Commonly known as:  BENADRYL Take 25 mg by mouth every 6 (six) hours as needed for itching.   escitalopram 20 MG tablet Commonly known as:  LEXAPRO TAKE 1 TABLET BY MOUTH EVERY DAY   fenofibrate 54 MG tablet TAKE ONE TABLET(54MG ) BY MOUTH TWICE DAILY   multivitamin with minerals tablet Take 1 tablet by mouth daily.   nystatin powder Commonly known as:  MYCOSTATIN/NYSTOP Apply topically 4 (four) times daily.   SPIRIVA HANDIHALER 18 MCG inhalation capsule Generic drug:  tiotropium INHALE 1 CAPSULE VIA HANDIHALER  ONCE DAILY AT THE SAME TIME EVERY DAY   Vitamin D (Ergocalciferol) 1.25 MG (50000 UT) Caps capsule Commonly known as:  DRISDOL Take 1 capsule (50,000 Units total) by mouth every Friday.       Allergies:  Allergies  Allergen Reactions  . Lovastatin Other (See Comments)    REACTION: leg pain  . Statins Other (See Comments)    REACTION: leg cramps, weakness  . Sulfa Antibiotics Hives and Itching  . Codeine Rash  . Niacin Rash    Flushing also   Family History: Family History  Problem Relation Age of Onset  . COPD Brother   . Heart disease Brother   . Diabetes Brother   . Lymphoma Brother   . Alzheimer's disease Brother   . Pancreatic cancer Sister   . Alzheimer's disease Sister   . Heart disease Mother   . Breast cancer Sister   . Leukemia Other   . Kidney disease Neg Hx    Social History:  reports that she quit smoking about 19 years ago. Her smoking use included cigarettes. She has a 45.00 pack-year smoking history. She has never used smokeless tobacco. She reports that she does not drink alcohol or use drugs.  ROS: UROLOGY Frequent Urination?: No Hard to postpone urination?: No Burning/pain with urination?: No Get up at night to urinate?: Yes Leakage of urine?: No Urine stream starts and stops?: No Trouble starting stream?: No Do you have to strain to urinate?: No Blood in urine?: No Urinary tract infection?: No Sexually transmitted disease?: No Injury to kidneys or bladder?: No Painful intercourse?: No Weak stream?: No Currently pregnant?: No Vaginal bleeding?: Yes Last menstrual period?: n  Gastrointestinal Nausea?: No Vomiting?: No Indigestion/heartburn?: No Diarrhea?: No Constipation?: No  Constitutional Fever: No Night sweats?: No Weight loss?: No Fatigue?: No  Skin Skin rash/lesions?: No Itching?: No  Eyes Blurred vision?: No Double vision?: No  Ears/Nose/Throat Sore throat?: No Sinus problems?:  No  Hematologic/Lymphatic Swollen glands?: No Easy bruising?: No  Cardiovascular Leg swelling?: No Chest pain?: No  Respiratory Cough?: No Shortness of breath?: Yes  Endocrine Excessive thirst?: No  Musculoskeletal Back pain?: No Joint pain?: No  Neurological Headaches?: No Dizziness?: No  Psychologic Depression?: No Anxiety?: No  Physical Exam: BP (!) 143/68   Pulse 70   Ht 5\' 6"  (1.676 m)   Wt 165 lb (74.8 kg)   BMI 26.63 kg/m   Constitutional:  Well nourished. Alert and oriented, No acute distress. HEENT: [place] AT, moist mucus membranes.  Trachea midline, no masses. Cardiovascular: No clubbing, cyanosis, or edema. Respiratory: Normal respiratory effort, no increased work of breathing. GU: No CVA tenderness.  No bladder fullness or masses.  Atrophic external genitalia, sparse pubic hair distribution, no lesions.  Normal urethral meatus, no lesions, no prolapse, no discharge.   No urethral  masses, tenderness and/or tenderness. No bladder fullness, tenderness or masses. pale vagina mucosa, poor estrogen effect, no discharge, no lesions, good pelvic support, no cystocele and no rectocele noted. Urethral caruncle noted but no longer friable. Cervix and uterus surgically absent.   Uterus is freely mobile and non-fixed.  No adnexal/parametria masses or tenderness noted.  Anus and perineum are without rashes or lesions.    Skin: No rashes, bruises or suspicious lesions. Neurologic: Grossly intact, no focal deficits, moving all 4 extremities. Psychiatric: Normal mood and affect.  Assessment & Plan:    1. Vaginal Bleeding secondary to Urethral Caruncle Continue vaginal estrogen cream; Patient was given a sample of vaginal estrogen cream (Premarin vaginal cream) and instructed to apply 0.5mg  (pea-sized amount)  just inside the vaginal introitus with a finger-tip every night for two weeks and then on Monday, Wednesday and Friday nights.  I explained to the patient that vaginally  administered estrogen, which causes only a slight increase in the blood estrogen levels, have fewer contraindications and adverse systemic effects that oral HT. Pt reports of no vaginal bleeding, resolved  F/u in 3 months (around 12/05/2018) for exam   Return in about 3 months (around 12/05/2018) for exam .  Ernestine Conrad Gordan Payment   [facility] [address] [place], [place] [address] [phone]  I, Lucas Mallow, am acting as a Education administrator for [employer],  I have reviewed the above documentation for accuracy and completeness, and I agree with the above.    Zara Council, PA-C

## 2018-10-02 ENCOUNTER — Other Ambulatory Visit: Payer: Self-pay | Admitting: Family Medicine

## 2018-10-12 ENCOUNTER — Other Ambulatory Visit: Payer: Self-pay | Admitting: Family Medicine

## 2018-11-02 ENCOUNTER — Other Ambulatory Visit: Payer: Self-pay | Admitting: Family Medicine

## 2018-12-05 ENCOUNTER — Ambulatory Visit: Payer: Medicare Other | Admitting: Urology

## 2018-12-09 ENCOUNTER — Other Ambulatory Visit: Payer: Self-pay

## 2018-12-10 MED ORDER — FENOFIBRATE 54 MG PO TABS: ORAL_TABLET | ORAL | 2 refills | Status: DC

## 2019-01-20 ENCOUNTER — Other Ambulatory Visit: Payer: Self-pay | Admitting: Family Medicine

## 2019-02-05 ENCOUNTER — Ambulatory Visit: Payer: Medicare Other | Admitting: Urology

## 2019-03-19 ENCOUNTER — Ambulatory Visit: Payer: Medicare Other | Admitting: Urology

## 2019-05-17 ENCOUNTER — Emergency Department
Admission: EM | Admit: 2019-05-17 | Discharge: 2019-05-17 | Disposition: A | Discharge status: Home / Self Care | Payer: Medicare Other | Attending: Emergency Medicine | Admitting: Emergency Medicine

## 2019-05-17 ENCOUNTER — Other Ambulatory Visit: Payer: Self-pay

## 2019-05-17 ENCOUNTER — Encounter: Payer: Self-pay | Admitting: Intensive Care

## 2019-05-17 DIAGNOSIS — N184 Chronic kidney disease, stage 4 (severe): Secondary | ICD-10-CM | POA: Insufficient documentation

## 2019-05-17 DIAGNOSIS — E1122 Type 2 diabetes mellitus with diabetic chronic kidney disease: Secondary | ICD-10-CM | POA: Insufficient documentation

## 2019-05-17 DIAGNOSIS — Z87891 Personal history of nicotine dependence: Secondary | ICD-10-CM | POA: Insufficient documentation

## 2019-05-17 DIAGNOSIS — R42 Dizziness and giddiness: Secondary | ICD-10-CM | POA: Insufficient documentation

## 2019-05-17 DIAGNOSIS — R197 Diarrhea, unspecified: Secondary | ICD-10-CM | POA: Insufficient documentation

## 2019-05-17 DIAGNOSIS — J449 Chronic obstructive pulmonary disease, unspecified: Secondary | ICD-10-CM | POA: Diagnosis not present

## 2019-05-17 DIAGNOSIS — I130 Hypertensive heart and chronic kidney disease with heart failure and stage 1 through stage 4 chronic kidney disease, or unspecified chronic kidney disease: Secondary | ICD-10-CM | POA: Insufficient documentation

## 2019-05-17 DIAGNOSIS — I509 Heart failure, unspecified: Secondary | ICD-10-CM | POA: Insufficient documentation

## 2019-05-17 LAB — COMPREHENSIVE METABOLIC PANEL
ALT: 10 U/L (ref 0–44)
AST: 15 U/L (ref 15–41)
Albumin: 3 g/dL — ABNORMAL LOW (ref 3.5–5.0)
Alkaline Phosphatase: 41 U/L (ref 38–126)
Anion gap: 9 (ref 5–15)
BUN: 42 mg/dL — ABNORMAL HIGH (ref 8–23)
CO2: 23 mmol/L (ref 22–32)
Calcium: 8.5 mg/dL — ABNORMAL LOW (ref 8.9–10.3)
Chloride: 104 mmol/L (ref 98–111)
Creatinine, Ser: 2.31 mg/dL — ABNORMAL HIGH (ref 0.44–1.00)
GFR calc Af Amer: 22 mL/min — ABNORMAL LOW (ref >60)
GFR calc non Af Amer: 19 mL/min — ABNORMAL LOW (ref >60)
Glucose, Bld: 152 mg/dL — ABNORMAL HIGH (ref 70–99)
Potassium: 4.2 mmol/L (ref 3.5–5.1)
Sodium: 136 mmol/L (ref 135–145)
Total Bilirubin: 0.8 mg/dL (ref 0.3–1.2)
Total Protein: 5.9 g/dL — ABNORMAL LOW (ref 6.5–8.1)

## 2019-05-17 LAB — TROPONIN I (HIGH SENSITIVITY): Troponin I (High Sensitivity): 12 ng/L (ref <18)

## 2019-05-17 LAB — CBC WITH DIFFERENTIAL/PLATELET
Abs Immature Granulocytes: 0.08 10*3/uL — ABNORMAL HIGH (ref 0.00–0.07)
Basophils Absolute: 0.1 10*3/uL (ref 0.0–0.1)
Basophils Relative: 0 %
Eosinophils Absolute: 0.1 10*3/uL (ref 0.0–0.5)
Eosinophils Relative: 0 %
HCT: 33.3 % — ABNORMAL LOW (ref 36.0–46.0)
Hemoglobin: 10.8 g/dL — ABNORMAL LOW (ref 12.0–15.0)
Immature Granulocytes: 1 %
Lymphocytes Relative: 24 %
Lymphs Abs: 3.8 10*3/uL (ref 0.7–4.0)
MCH: 30.7 pg (ref 26.0–34.0)
MCHC: 32.4 g/dL (ref 30.0–36.0)
MCV: 94.6 fL (ref 80.0–100.0)
Monocytes Absolute: 3.2 10*3/uL — ABNORMAL HIGH (ref 0.1–1.0)
Monocytes Relative: 20 %
Neutro Abs: 8.8 10*3/uL — ABNORMAL HIGH (ref 1.7–7.7)
Neutrophils Relative %: 55 %
Platelets: 309 10*3/uL (ref 150–400)
RBC: 3.52 MIL/uL — ABNORMAL LOW (ref 3.87–5.11)
RDW: 12.7 % (ref 11.5–15.5)
Smear Review: NORMAL
WBC: 16.1 10*3/uL — ABNORMAL HIGH (ref 4.0–10.5)
nRBC: 0 % (ref 0.0–0.2)

## 2019-05-17 MED ORDER — SODIUM CHLORIDE 0.9 % IV SOLN
1000.0000 mL | Freq: Once | INTRAVENOUS | Status: AC
  Administered 2019-05-17: 14:00:00 1000 mL via INTRAVENOUS

## 2019-05-17 NOTE — ED Notes (Signed)
Pt reminded stool sample needed when she can give it. Pt declines now. States feels much better since fluids finished. Pt given another warm blanket.

## 2019-05-17 NOTE — ED Provider Notes (Signed)
Vitals:   05/17/19 1600 05/17/19 1630  BP: 117/84   Pulse: 83 85  Resp: 17 16  Temp:    SpO2: 97% 98%     Patient and her daughter both reports she feels much much better after fluids.  She does not wish to wait any longer or attempt to collect a stool sample as she has not been able to provide.  She would prefer to go home collect a sample and submitted to the lab.  I have provided her a lab order for C. difficile and GI stool panel.  Return precautions and treatment recommendations and follow-up discussed with the patient who is agreeable with the plan.  Patient and her daughter both comfortable with plan to go home with careful return precautions.   Delman Kitten, MD 05/17/19 [phone]

## 2019-05-17 NOTE — ED Notes (Addendum)
Pt assisted onto bedpan as she needed to urinate and pt didn't think she could make it safely to the bedside toilet d/t weakness in legs. Pt had very small BM but it was mixed in with the urine. Pt provided peri care.

## 2019-05-17 NOTE — ED Notes (Signed)
Pt/family educated on stool collection order for home. Given hat for toilet and sterile specimen cup with pt sticker to bring sample back when ready. Understands must not be mixed with urine.

## 2019-05-17 NOTE — ED Triage Notes (Signed)
Pt arrived by EMS from home for diarrhea for a few days. Intermittent dizziness

## 2019-05-17 NOTE — ED Provider Notes (Signed)
[facility] Emergency Department Provider Note       Time seen: ----------------------------------------- 1:17 PM on 05/17/2019 -----------------------------------------   I have reviewed the triage vital signs and the nursing notes.  HISTORY   Chief Complaint Diarrhea    HPI Jill [name] is a 85 y.o. female with a history of allergies, arthritis, chronic kidney disease, depression, diabetes, hyperlipidemia, hypertension, syncope who presents to the ED for diarrhea for the past several days with dizziness that is been intermittent.  Patient arrives by EMS from home.  She has had numerous episodes over the past several days, has not had fevers, chills, vomiting or other complaints.  She has been weak and dizzy, no syncope.  Past Medical History:  Diagnosis Date  . Allergy   . Arthritis    osteoarthritis   . Chronic kidney disease   . Complication of anesthesia    difficult waking   . Depression   . Diabetes mellitus   . Diverticula, colon   . Family history of anesthesia complication    Son is difficult intubation  . Hyperlipidemia   . Hypertension   . Vasovagal syncope 2006   Negative cardiac workup-myoview, echo    Patient Active Problem List   Diagnosis Date Noted  . Pulmonary nodule 05/29/2018  . Urinary urgency 02/03/2017  . Bilateral buttock pain 02/02/2017  . Gout involving toe of right foot 12/23/2016  . [place] intertrigo 10/28/2016  . Stage 2 moderate COPD by GOLD classification ([place]) 09/13/2016  . Chronic diastolic congestive heart failure ([place]) 08/08/2016  . Intracranial bleed ([place])   . BPPV (benign paroxysmal positional vertigo) 06/24/2016  . Memory loss 06/24/2016  . Accidental fall 06/24/2016  . Chronic neck pain 03/18/2016  . Autonomic neuropathy due to diabetes ([place]) 03/18/2016  . Anemia in chronic kidney disease 12/15/2015  . Incomplete bladder emptying 10/15/2015  . Abscess, renal/perirenal 10/15/2015  . Recurrent  UTI 10/15/2015  . Lumbar back pain with radiculopathy affecting left lower extremity 05/05/2015  . Renal abscess 01/28/2015  . Chronic fatigue 01/22/2015  . Chronic idiopathic urticaria 01/17/2015  . S/P hip replacement 05/16/2014  . DNR (do not resuscitate) 02/20/2014  . ALLERGIC RHINITIS 12/17/2008  . Vitamin D deficiency 07/01/2008  . CKD (chronic kidney disease) stage 4, GFR 15-29 ml/min (HCC) 02/11/2008  . DIVERTICULOSIS OF COLON 02/01/2008  . COLONIC POLYPS, ADENOMATOUS 10/19/2006  . Diabetes mellitus due to underlying condition with diabetic autonomic neuropathy ([place]) 10/19/2006  . Hyperlipidemia 10/19/2006  . OBESITY, NOS 10/19/2006  . Major depression, recurrent ([place]) 10/19/2006  . HYPERTENSION, BENIGN SYSTEMIC 10/19/2006  . Recurrent urticaria 10/19/2006    Past Surgical History:  Procedure Laterality Date  . ABDOMINAL HYSTERECTOMY  1977   fibroid  . JOINT REPLACEMENT  2009   rt hip  . TOTAL HIP ARTHROPLASTY Left 05/12/2014   dr Mayer Camel  . TOTAL HIP ARTHROPLASTY Left 05/12/2014   Procedure: TOTAL HIP ARTHROPLASTY;  Surgeon: Kerin Salen, MD;  Location: [facility];  Service: Orthopedics;  Laterality: Left;    Allergies Lovastatin, Statins, Sulfa antibiotics, Codeine, and Niacin  Social History Social History   Tobacco Use  . Smoking status: Former Smoker    Packs/day: 1.00    Years: 45.00    Pack years: 45.00    Types: Cigarettes    Quit date: 06/16/1999    Years since quitting: 19.9  . Smokeless tobacco: Never Used  . Tobacco comment: quit 15 years ago  Substance Use Topics  . Alcohol use:  No    Alcohol/week: 0.0 standard drinks  . Drug use: No   Review of Systems Constitutional: Negative for fever. Cardiovascular: Negative for chest pain. Respiratory: Negative for shortness of breath. Gastrointestinal: Negative for abdominal pain, positive for copious diarrhea Musculoskeletal: Negative for back pain. Skin: Negative for rash. Neurological: Negative for  headaches, focal weakness or numbness.  All systems negative/normal/unremarkable except as stated in the HPI  ____________________________________________   PHYSICAL EXAM:  VITAL SIGNS: ED Triage Vitals  Enc Vitals Group     BP 05/17/19 1311 (!) 127/49     Pulse Rate 05/17/19 1311 85     Resp 05/17/19 1311 16     Temp 05/17/19 1311 98.9 F (37.2 C)     Temp Source 05/17/19 1311 Oral     SpO2 05/17/19 1311 98 %     Weight 05/17/19 1312 181 lb 8 oz (82.3 kg)     Height 05/17/19 1312 5' 2.5" (1.588 m)     Head Circumference --      Peak Flow --      Pain Score 05/17/19 1312 0     Pain Loc --      Pain Edu? --      Excl. in [place]? --    Constitutional: Alert and oriented. Well appearing and in no distress. Eyes: Conjunctivae are normal. Normal extraocular movements. ENT      Head: Normocephalic and atraumatic.      Nose: No congestion/rhinnorhea.      Mouth/Throat: Mucous membranes are moist.      Neck: No stridor. Cardiovascular: Normal rate, regular rhythm. No murmurs, rubs, or gallops. Respiratory: Normal respiratory effort without tachypnea nor retractions. Breath sounds are clear and equal bilaterally. No wheezes/rales/rhonchi. Gastrointestinal: Soft and nontender. Normal bowel sounds Musculoskeletal: Nontender with normal range of motion in extremities. No lower extremity tenderness nor edema. Neurologic:  Normal speech and language. No gross focal neurologic deficits are appreciated.  Skin:  Skin is warm, dry and intact. No rash noted. Psychiatric: Mood and affect are normal. Speech and behavior are normal.  ____________________________________________  EKG: Interpreted by me.  Sinus rhythm rate of 85 bpm, normal PR interval, long QT, normal axis  ____________________________________________  ED COURSE:  As part of my medical decision making, I reviewed the following data within the [facility] History obtained from family if available, nursing notes,  old chart and ekg, as well as notes from prior ED visits. Patient presented for diarrhea, we will assess with labs and imaging as indicated at this time.   Procedures  Jill [name] was evaluated in Emergency Department on 05/17/2019 for the symptoms described in the history of present illness. She was evaluated in the context of the global COVID-19 pandemic, which necessitated consideration that the patient might be at risk for infection with the SARS-CoV-2 virus that causes COVID-19. Institutional protocols and algorithms that pertain to the evaluation of patients at risk for COVID-19 are in a state of rapid change based on information released by regulatory bodies including the CDC and federal and state organizations. These policies and algorithms were followed during the patient's care in the ED.  ____________________________________________   LABS (pertinent positives/negatives)  Labs Reviewed  CBC WITH DIFFERENTIAL/PLATELET - Abnormal; Notable for the following components:      Result Value   WBC 16.1 (*)    RBC 3.52 (*)    Hemoglobin 10.8 (*)    HCT 33.3 (*)    Neutro Abs 8.8 (*)  Monocytes Absolute 3.2 (*)    Abs Immature Granulocytes 0.08 (*)    All other components within normal limits  COMPREHENSIVE METABOLIC PANEL - Abnormal; Notable for the following components:   Glucose, Bld 152 (*)    BUN 42 (*)    Creatinine, Ser 2.31 (*)    Calcium 8.5 (*)    Total Protein 5.9 (*)    Albumin 3.0 (*)    GFR calc non Af Amer 19 (*)    GFR calc Af Amer 22 (*)    All other components within normal limits  C DIFFICILE QUICK SCREEN W PCR REFLEX  URINALYSIS, COMPLETE (UACMP) WITH MICROSCOPIC  GI PATHOGEN PANEL BY PCR, STOOL  TROPONIN I (HIGH SENSITIVITY)   ____________________________________________   DIFFERENTIAL DIAGNOSIS   Dehydration, colitis, electrolyte abnormality, renal failure, medication side effect, arrhythmia  FINAL ASSESSMENT AND PLAN  Diarrhea,  dizziness   Plan: The patient had presented for significant diarrhea over the past several days. Patient's labs have revealed leukocytosis and chronic kidney disease without other acute process.  Urinalysis and stool studies are still pending.  Patient does not want to be admitted.   Laurence Aly, MD    Note: This note was generated in part or whole with voice recognition software. Voice recognition is usually quite accurate but there are transcription errors that can and very often do occur. I apologize for any typographical errors that were not detected and corrected.     Earleen Newport, MD 05/17/19 1435

## 2019-05-17 NOTE — Discharge Instructions (Addendum)
Please follow-up closely with Dr. Josefa Half, schedule follow-up visit Monday.  Return to the ER if you have worsening symptoms, bloody diarrhea, begin vomiting, have fever, develop abdominal pain, or other new concerns or symptoms arise.  Attempt to collect a sample of your stool as we have described, if you are able please drop this off at our outpatient lab with the lab order written for you.

## 2019-05-17 NOTE — ED Notes (Signed)
Pt signed printed d/c paperwork as topaz froze.

## 2019-05-20 ENCOUNTER — Telehealth: Payer: Self-pay | Admitting: *Deleted

## 2019-05-20 DIAGNOSIS — R197 Diarrhea, unspecified: Secondary | ICD-10-CM

## 2019-05-20 DIAGNOSIS — R531 Weakness: Secondary | ICD-10-CM

## 2019-05-20 NOTE — Telephone Encounter (Signed)
Spoke with Home Health, the ED visit wont work for the face to face needed to order the [facility]. Also what skilled nursing need are you ordering , what skilled services? Patient hasnt been seen since last October 2019. Patient needs a hospital followup visit in order to be able to order [facility].

## 2019-05-20 NOTE — Telephone Encounter (Signed)
I have placed a home health referral but I am not sure this can be done without face to face visit. I used the ER visit as face to face... hopefully that will be adequate. Let daughter know.

## 2019-05-20 NOTE — Telephone Encounter (Signed)
Estill Bamberg notified as instructed by telephone.

## 2019-05-20 NOTE — Telephone Encounter (Signed)
Shannon left a voicemail stating that patient was at the hospital last week with chronic diarrhea. Larene Beach stated that patient is real fatigued. The are hoping that Dr.Bedsole will put in an order for skilled nursing and someone to come in a couple of days a week to help her. Patient is to get a stool sample and return it, but has not been able to do it yet because of the urine mixing with the stool. Larene Beach stated that they are working on getting the stool sample. Larene Beach requested that the return call go to [place] which she is on the [facility].

## 2019-05-21 ENCOUNTER — Ambulatory Visit (INDEPENDENT_AMBULATORY_CARE_PROVIDER_SITE_OTHER): Payer: Medicare Other | Admitting: Family Medicine

## 2019-05-21 ENCOUNTER — Encounter: Payer: Self-pay | Admitting: Family Medicine

## 2019-05-21 VITALS — Ht 63.0 in

## 2019-05-21 DIAGNOSIS — R5381 Other malaise: Secondary | ICD-10-CM

## 2019-05-21 DIAGNOSIS — A09 Infectious gastroenteritis and colitis, unspecified: Secondary | ICD-10-CM | POA: Diagnosis not present

## 2019-05-21 DIAGNOSIS — E860 Dehydration: Secondary | ICD-10-CM

## 2019-05-21 DIAGNOSIS — R29898 Other symptoms and signs involving the musculoskeletal system: Secondary | ICD-10-CM

## 2019-05-21 NOTE — Telephone Encounter (Signed)
Jill [name], figured... please call pt to set up appt for hospitals follow up and let them know [place] restrictions.

## 2019-05-21 NOTE — Assessment & Plan Note (Signed)
She is very week given persistent diarrhea and dehydration.. now improving some but far from baseline. Home health PT indicated for strengthening to return to previous level of function.

## 2019-05-21 NOTE — Assessment & Plan Note (Signed)
Now fluid status improved but with continued diarrhea at risk for repeat dehydration. Encouarged fluid intake. Close following of BPs.

## 2019-05-21 NOTE — Assessment & Plan Note (Signed)
Likely infectious given elevated wbcs... family will return stool sample for GI pathogen panel and cdif testing.

## 2019-05-21 NOTE — Telephone Encounter (Signed)
Jill [name] has a Doxy scheduled with you today at 2:20 pm.

## 2019-05-21 NOTE — Progress Notes (Signed)
VIRTUAL VISIT Due to national recommendations of social distancing due to [place] 19, a virtual visit is felt to be most appropriate for this patient at this time.   I connected with the patient on 05/21/19 at  2:20 PM EDT by virtual telehealth platform and verified that I am speaking with the correct person using two identifiers.   I discussed the limitations, risks, security and privacy concerns of performing an evaluation and management service by  virtual telehealth platform and the availability of in person appointments. I also discussed with the patient that there may be a patient responsible charge related to this service. The patient expressed understanding and agreed to proceed.  Patient location: Home Provider Location: [facility] [place] Participants: Eliezer Lofts and [name]   Chief Complaint  Patient presents with  . Follow-up    ED Visit-Diarrhea  . Fatigue    History of Present Illness: 85 year old female with diabetes, HTN, CKD, recurrent UTI, diastolic heart failure, COPD presents for follow up ED visit for diarrhea ongoing x 1 week, intermittant dizziness and weakness. She is also here for assessment for need for home health.   She was seen in ED on 05/17/2019   cbc showed  Hg 10.8, wbc 16.1, cr 2.31, tropinin x 1 neg EKG no change  Given IVF.. felt much better.   Planned outpt collection of cdiff and GI panel  Today she reports she has had improvement of diarrhea but still loose BMs.Marland Kitchen only 2 today.  no fever, no abd pain, no blood in stool.  No nausea or emesis.  Significantly worse weakness in last few weeks. She is having trouble getting to bathroom. She cannot perform ADLs on her own due to weakness and inability to ambulate. Cannot bathe on own.   Both legs are weak.. she can walk  Only 10 feet before having to sit and rest.  Family is having issues caring for her given her difficult with ambulation.  Occ shortness of breath. No chest pain. No  swelling in ankles. Poor appetite,  Water intake is moderate.   COVID 19 screen No recent travel or known exposure to COVID19 The patient denies respiratory symptoms of COVID 19 at this time.  The importance of social distancing was discussed today.   Review of Systems  Constitutional: Negative for fever.  HENT: Negative for congestion.   Respiratory: Positive for shortness of breath. Negative for cough.   Cardiovascular: Negative for chest pain, palpitations, leg swelling and PND.  Gastrointestinal: Positive for diarrhea. Negative for abdominal pain, blood in stool, constipation, melena, nausea and vomiting.  Genitourinary: Negative for dysuria.  Musculoskeletal: Negative for myalgias.  Neurological: Positive for dizziness and weakness.      Past Medical History:  Diagnosis Date  . Allergy   . Arthritis    osteoarthritis   . Chronic kidney disease   . Complication of anesthesia    difficult waking   . Depression   . Diabetes mellitus   . Diverticula, colon   . Family history of anesthesia complication    Son is difficult intubation  . Hyperlipidemia   . Hypertension   . Vasovagal syncope 2006   Negative cardiac workup-myoview, echo    reports that she quit smoking about 19 years ago. Her smoking use included cigarettes. She has a 45.00 pack-year smoking history. She has never used smokeless tobacco. She reports that she does not drink alcohol or use drugs.   Current Outpatient Medications:  .  albuterol (  PROVENTIL HFA;VENTOLIN HFA) 108 (90 Base) MCG/ACT inhaler, Inhale 2 puffs into the lungs every 4 (four) hours as needed for wheezing or shortness of breath., Disp: 1 Inhaler, Rfl: 1 .  ALPRAZolam (XANAX) 0.25 MG tablet, Take 1 tablet (0.25 mg total) by mouth 2 (two) times daily as needed for anxiety., Disp: , Rfl:  .  budesonide-formoterol (SYMBICORT) 160-4.5 MCG/ACT inhaler, Inhale 2 puffs into the lungs 2 (two) times daily., Disp: 1 Inhaler, Rfl: 3 .  diphenhydrAMINE  (BENADRYL) 25 MG tablet, Take 25 mg by mouth every 6 (six) hours as needed for itching., Disp: , Rfl:  .  escitalopram (LEXAPRO) 20 MG tablet, TAKE 1 TABLET BY MOUTH EVERY DAY, Disp: 90 tablet, Rfl: 1 .  fenofibrate 54 MG tablet, TAKE ONE TABLET(54MG ) BY MOUTH TWICE DAILY, Disp: 180 tablet, Rfl: 2 .  midodrine (PROAMATINE) 5 MG tablet, Take 5 mg by mouth daily., Disp: , Rfl:  .  Multiple Vitamins-Minerals (MULTIVITAMIN WITH MINERALS) tablet, Take 1 tablet by mouth daily., Disp: , Rfl:  .  nystatin (MYCOSTATIN/NYSTOP) powder, Apply topically 4 (four) times daily., Disp: 56.7 g, Rfl: 3 .  SPIRIVA HANDIHALER 18 MCG inhalation capsule, INHALE 1 CAPSULE VIA HANDIHALER ONCE DAILY AT THE SAME TIME EVERY DAY, Disp: 30 capsule, Rfl: 5 .  Vitamin D, Ergocalciferol, (DRISDOL) 1.25 MG (50000 UT) CAPS capsule, Take 1 capsule (50,000 Units total) by mouth every Friday., Disp: 12 capsule, Rfl: 3   Observations/Objective: Height 5\' 3"  (1.6 m).  Physical Exam  Physical Exam Constitutional:      General: The patient is not in acute distress. Pulmonary:     Effort: Pulmonary effort is normal. No respiratory distress.  Neurological:     Mental Status: The patient is alert and oriented to person, place, and time.  Psychiatric:        Mood and Affect: Mood normal.        Behavior: Behavior normal.   Assessment and Plan Dehydration Now fluid status improved but with continued diarrhea at risk for repeat dehydration. Encouarged fluid intake. Close following of BPs.  Acute infectious diarrhea Likely infectious given elevated wbcs... family will return stool sample for GI pathogen panel and cdif testing.  Bilateral leg weakness She is very week given persistent diarrhea and dehydration.. now improving some but far from baseline. Home health PT indicated for strengthening to return to previous level of function.     I discussed the assessment and treatment plan with the patient. The patient was provided an  opportunity to ask questions and all were answered. The patient agreed with the plan and demonstrated an understanding of the instructions.   The patient was advised to call back or seek an in-person evaluation if the symptoms worsen or if the condition fails to improve as anticipated.     Eliezer Lofts, MD

## 2019-05-24 DIAGNOSIS — M10371 Gout due to renal impairment, right ankle and foot: Secondary | ICD-10-CM

## 2019-05-24 NOTE — Telephone Encounter (Signed)
Given diarrhea is gone... we should have her come in for appt early next week and I can check labs if needed. Le me know if family not agreeable . Verify no new chest pain or  severe worsening shortness of breath... she did have significant weakness already.  ( I also sent MYchart message so they may call you or Mychart back first.)

## 2019-05-27 ENCOUNTER — Telehealth: Payer: Self-pay | Admitting: Family Medicine

## 2019-05-27 NOTE — Telephone Encounter (Signed)
Gerald Stabs, physical therapist with [facility] is calling to get verbal orders.   Physical Therapy with patient  1x week for 1 week 2x week for 3 weeks    Phone- [phone]

## 2019-05-27 NOTE — Telephone Encounter (Signed)
Okay to give verbal orders as requested. 

## 2019-05-27 NOTE — Telephone Encounter (Signed)
Chris given verbal orders via telephone for Physical Therapy with patient  1x week for 1 week, then 2x week for 3 weeks.

## 2019-05-28 ENCOUNTER — Encounter: Payer: Self-pay | Admitting: Family Medicine

## 2019-05-28 ENCOUNTER — Other Ambulatory Visit: Payer: Self-pay

## 2019-05-28 ENCOUNTER — Ambulatory Visit (INDEPENDENT_AMBULATORY_CARE_PROVIDER_SITE_OTHER): Payer: Medicare Other | Admitting: Family Medicine

## 2019-05-28 VITALS — BP 144/80 | HR 97 | Temp 98.6°F | Ht 63.0 in | Wt 166.2 lb

## 2019-05-28 DIAGNOSIS — D631 Anemia in chronic kidney disease: Secondary | ICD-10-CM | POA: Diagnosis not present

## 2019-05-28 DIAGNOSIS — I5032 Chronic diastolic (congestive) heart failure: Secondary | ICD-10-CM

## 2019-05-28 DIAGNOSIS — N184 Chronic kidney disease, stage 4 (severe): Secondary | ICD-10-CM

## 2019-05-28 DIAGNOSIS — N183 Chronic kidney disease, stage 3 unspecified: Secondary | ICD-10-CM

## 2019-05-28 DIAGNOSIS — E1122 Type 2 diabetes mellitus with diabetic chronic kidney disease: Secondary | ICD-10-CM

## 2019-05-28 DIAGNOSIS — Z23 Encounter for immunization: Secondary | ICD-10-CM | POA: Diagnosis not present

## 2019-05-28 DIAGNOSIS — J449 Chronic obstructive pulmonary disease, unspecified: Secondary | ICD-10-CM

## 2019-05-28 DIAGNOSIS — M10371 Gout due to renal impairment, right ankle and foot: Secondary | ICD-10-CM

## 2019-05-28 DIAGNOSIS — E1140 Type 2 diabetes mellitus with diabetic neuropathy, unspecified: Secondary | ICD-10-CM | POA: Diagnosis not present

## 2019-05-28 NOTE — Addendum Note (Signed)
Addended by: Carter Kitten on: 05/28/2019 03:51 PM   Modules accepted: Orders

## 2019-05-28 NOTE — Assessment & Plan Note (Signed)
Slightly worsened in ER.. due for re-eval.

## 2019-05-28 NOTE — Progress Notes (Addendum)
Chief Complaint  Patient presents with  . Follow-up    [facility] ED-Diarrhea    History of Present Illness: HPI    85 year old female presents for follow up.   Seen in ED on 05/17/2019 with acute diarrhea x 1 week. She was dehydrated.  Has now resolved... no further BMs.  PT started via home health for deconditioning and bilateral leg weakness.  She is day by day feeling better.  She was  having swelling and pain in her right foot on 10/2.  Now pain is resolved,   In legs.No swelling   Stable DOE, no CP. No cough, no fever.   COPD.. stable control on symbicort and spiriva.  COVID 19 screen No recent travel or known exposure to COVID19 The patient denies respiratory symptoms of COVID 19 at this time.  The importance of social distancing was discussed today.   Review of Systems  Constitutional: Negative for chills and fever.  HENT: Negative for congestion and ear pain.   Eyes: Negative for pain and redness.  Respiratory: Negative for cough and shortness of breath.   Cardiovascular: Negative for chest pain, palpitations and leg swelling.  Gastrointestinal: Negative for abdominal pain, blood in stool, constipation, diarrhea, nausea and vomiting.  Genitourinary: Negative for dysuria.  Musculoskeletal: Negative for falls and myalgias.  Skin: Negative for rash.  Neurological: Negative for dizziness.  Psychiatric/Behavioral: Negative for depression. The patient is not nervous/anxious.       Past Medical History:  Diagnosis Date  . Allergy   . Arthritis    osteoarthritis   . Chronic kidney disease   . Complication of anesthesia    difficult waking   . Depression   . Diabetes mellitus   . Diverticula, colon   . Family history of anesthesia complication    Son is difficult intubation  . Hyperlipidemia   . Hypertension   . Vasovagal syncope 2006   Negative cardiac workup-myoview, echo    reports that she quit smoking about 19 years ago. Her smoking use included cigarettes.  She has a 45.00 pack-year smoking history. She has never used smokeless tobacco. She reports that she does not drink alcohol or use drugs.   Current Outpatient Medications:  .  albuterol (PROVENTIL HFA;VENTOLIN HFA) 108 (90 Base) MCG/ACT inhaler, Inhale 2 puffs into the lungs every 4 (four) hours as needed for wheezing or shortness of breath., Disp: 1 Inhaler, Rfl: 1 .  ALPRAZolam (XANAX) 0.25 MG tablet, Take 1 tablet (0.25 mg total) by mouth 2 (two) times daily as needed for anxiety., Disp: , Rfl:  .  budesonide-formoterol (SYMBICORT) 160-4.5 MCG/ACT inhaler, Inhale 2 puffs into the lungs 2 (two) times daily., Disp: 1 Inhaler, Rfl: 3 .  diphenhydrAMINE (BENADRYL) 25 MG tablet, Take 25 mg by mouth every 6 (six) hours as needed for itching., Disp: , Rfl:  .  escitalopram (LEXAPRO) 20 MG tablet, TAKE 1 TABLET BY MOUTH EVERY DAY, Disp: 90 tablet, Rfl: 1 .  fenofibrate 54 MG tablet, TAKE ONE TABLET(54MG ) BY MOUTH TWICE DAILY, Disp: 180 tablet, Rfl: 2 .  midodrine (PROAMATINE) 5 MG tablet, Take 5 mg by mouth daily., Disp: , Rfl:  .  Multiple Vitamins-Minerals (MULTIVITAMIN WITH MINERALS) tablet, Take 1 tablet by mouth daily., Disp: , Rfl:  .  nystatin (MYCOSTATIN/NYSTOP) powder, Apply topically 4 (four) times daily., Disp: 56.7 g, Rfl: 3 .  SPIRIVA HANDIHALER 18 MCG inhalation capsule, INHALE 1 CAPSULE VIA HANDIHALER ONCE DAILY AT THE SAME TIME EVERY DAY, Disp: 30 capsule,  Rfl: 5 .  Vitamin D, Ergocalciferol, (DRISDOL) 1.25 MG (50000 UT) CAPS capsule, Take 1 capsule (50,000 Units total) by mouth every Friday., Disp: 12 capsule, Rfl: 3   Observations/Objective: Blood pressure (!) 144/80, pulse 97, temperature 98.6 F (37 C), temperature source Temporal, height 5\' 3"  (1.6 m), weight 166 lb 4 oz (75.4 kg), SpO2 95 %.  Physical Exam Constitutional:      General: She is not in acute distress.    Appearance: Normal appearance. She is well-developed. She is not ill-appearing or toxic-appearing.      Comments: Elderly female in NAD  HENT:     Head: Normocephalic.     Right Ear: Hearing, tympanic membrane, ear canal and external ear normal. Tympanic membrane is not erythematous, retracted or bulging.     Left Ear: Hearing, tympanic membrane, ear canal and external ear normal. Tympanic membrane is not erythematous, retracted or bulging.     Nose: No mucosal edema or rhinorrhea.     Right Sinus: No maxillary sinus tenderness or frontal sinus tenderness.     Left Sinus: No maxillary sinus tenderness or frontal sinus tenderness.     Mouth/Throat:     Pharynx: Uvula midline.  Eyes:     General: Lids are normal. Lids are everted, no foreign bodies appreciated.     Conjunctiva/sclera: Conjunctivae normal.     Pupils: Pupils are equal, round, and reactive to light.  Neck:     Musculoskeletal: Normal range of motion and neck supple.     Thyroid: No thyroid mass or thyromegaly.     Vascular: No carotid bruit.     Trachea: Trachea normal.  Cardiovascular:     Rate and Rhythm: Normal rate and regular rhythm.     Pulses: Normal pulses.     Heart sounds: Normal heart sounds, S1 normal and S2 normal. No murmur. No friction rub. No gallop.   Pulmonary:     Effort: Pulmonary effort is normal. No tachypnea or respiratory distress.     Breath sounds: Normal breath sounds. No decreased breath sounds, wheezing, rhonchi or rales.  Abdominal:     General: Bowel sounds are normal.     Palpations: Abdomen is soft.     Tenderness: There is no abdominal tenderness.  Skin:    General: Skin is warm and dry.     Findings: No rash.  Neurological:     Mental Status: She is alert.  Psychiatric:        Mood and Affect: Mood is not anxious or depressed.        Speech: Speech normal.        Behavior: Behavior normal. Behavior is cooperative.        Thought Content: Thought content normal.        Judgment: Judgment normal.      Assessment and Plan Type 2 diabetes mellitus with diabetic neuropathy,  unspecified (HCC) Due for re-eval.  CKD (chronic kidney disease) stage 4, GFR 15-29 ml/min (HCC) Due for re-eval.. AKI with dehydration now likely resolved.  Anemia in chronic kidney disease Slightly worsened in ER.. due for re-eval.  Gout involving toe of right foot No current flare.  Due for uric acid.Marland Kitchen discuss high purine foods to avoid.  Stage 2 moderate COPD by GOLD classification ([place]) [place] control.  Chronic diastolic congestive heart failure (HCC) No current volume overload... euvolemic.  Neuropathy due to type 2 diabetes mellitus (HCC) Stable  Type 2 diabetes mellitus with stage 3 chronic kidney disease (HCC) Due  for re-eval.     Eliezer Lofts, MD

## 2019-05-28 NOTE — Patient Instructions (Signed)
Please stop at the lab to have labs drawn.  

## 2019-05-28 NOTE — Assessment & Plan Note (Signed)
Stbale control.

## 2019-05-28 NOTE — Assessment & Plan Note (Signed)
No current volume overload... euvolemic.

## 2019-05-28 NOTE — Assessment & Plan Note (Signed)
Due for re-eval.. AKI with dehydration now likely resolved.

## 2019-05-28 NOTE — Assessment & Plan Note (Signed)
Due for re-eval. 

## 2019-05-28 NOTE — Assessment & Plan Note (Signed)
No current flare.  Due for uric acid.Marland Kitchen discuss high purine foods to avoid.

## 2019-05-29 LAB — CBC WITH DIFFERENTIAL/PLATELET
Basophils Absolute: 0.1 10*3/uL (ref 0.0–0.1)
Basophils Relative: 1.2 % (ref 0.0–3.0)
Eosinophils Absolute: 0.1 10*3/uL (ref 0.0–0.7)
Eosinophils Relative: 0.6 % (ref 0.0–5.0)
HCT: 35.5 % — ABNORMAL LOW (ref 36.0–46.0)
Hemoglobin: 11.6 g/dL — ABNORMAL LOW (ref 12.0–15.0)
Lymphocytes Relative: 37.1 % (ref 12.0–46.0)
Lymphs Abs: 3.7 10*3/uL (ref 0.7–4.0)
MCHC: 32.6 g/dL (ref 30.0–36.0)
MCV: 95.2 fl (ref 78.0–100.0)
Monocytes Absolute: 0.7 10*3/uL (ref 0.1–1.0)
Monocytes Relative: 6.8 % (ref 3.0–12.0)
Neutro Abs: 5.4 10*3/uL (ref 1.4–7.7)
Neutrophils Relative %: 54.3 % (ref 43.0–77.0)
Platelets: 306 10*3/uL (ref 150.0–400.0)
RBC: 3.73 Mil/uL — ABNORMAL LOW (ref 3.87–5.11)
RDW: 13.5 % (ref 11.5–15.5)
WBC: 10 10*3/uL (ref 4.0–10.5)

## 2019-05-29 LAB — COMPREHENSIVE METABOLIC PANEL
ALT: 11 U/L (ref 0–35)
AST: 22 U/L (ref 0–37)
Albumin: 3.6 g/dL (ref 3.5–5.2)
Alkaline Phosphatase: 45 U/L (ref 39–117)
BUN: 29 mg/dL — ABNORMAL HIGH (ref 6–23)
CO2: 26 mEq/L (ref 19–32)
Calcium: 9.5 mg/dL (ref 8.4–10.5)
Chloride: 108 mEq/L (ref 96–112)
Creatinine, Ser: 1.93 mg/dL — ABNORMAL HIGH (ref 0.40–1.20)
GFR: 24.66 mL/min — ABNORMAL LOW (ref >60.00)
Glucose, Bld: 132 mg/dL — ABNORMAL HIGH (ref 70–99)
Potassium: 4.9 mEq/L (ref 3.5–5.1)
Sodium: 142 mEq/L (ref 135–145)
Total Bilirubin: 0.4 mg/dL (ref 0.2–1.2)
Total Protein: 6.4 g/dL (ref 6.0–8.3)

## 2019-05-29 LAB — URIC ACID: Uric Acid, Serum: 9.7 mg/dL — ABNORMAL HIGH (ref 2.4–7.0)

## 2019-05-29 LAB — HEMOGLOBIN A1C: Hgb A1c MFr Bld: 7.1 % — ABNORMAL HIGH (ref 4.6–6.5)

## 2019-06-05 NOTE — Progress Notes (Signed)
Okay.. fixed! Thanks.  Eliezer Lofts, MD [place] [facility]

## 2019-06-18 ENCOUNTER — Other Ambulatory Visit: Payer: Self-pay | Admitting: Family Medicine

## 2019-07-03 NOTE — Assessment & Plan Note (Addendum)
Due for re-eval. 

## 2019-07-03 NOTE — Assessment & Plan Note (Signed)
Stable

## 2019-07-05 DIAGNOSIS — E860 Dehydration: Secondary | ICD-10-CM

## 2019-07-05 DIAGNOSIS — N189 Chronic kidney disease, unspecified: Secondary | ICD-10-CM

## 2019-07-05 DIAGNOSIS — M1990 Unspecified osteoarthritis, unspecified site: Secondary | ICD-10-CM

## 2019-07-05 DIAGNOSIS — J449 Chronic obstructive pulmonary disease, unspecified: Secondary | ICD-10-CM

## 2019-07-05 DIAGNOSIS — I5030 Unspecified diastolic (congestive) heart failure: Secondary | ICD-10-CM | POA: Diagnosis not present

## 2019-07-05 DIAGNOSIS — Z87440 Personal history of urinary (tract) infections: Secondary | ICD-10-CM

## 2019-07-05 DIAGNOSIS — G8220 Paraplegia, unspecified: Secondary | ICD-10-CM

## 2019-07-05 DIAGNOSIS — I130 Hypertensive heart and chronic kidney disease with heart failure and stage 1 through stage 4 chronic kidney disease, or unspecified chronic kidney disease: Secondary | ICD-10-CM | POA: Diagnosis not present

## 2019-07-05 DIAGNOSIS — R55 Syncope and collapse: Secondary | ICD-10-CM

## 2019-07-05 DIAGNOSIS — Z9181 History of falling: Secondary | ICD-10-CM

## 2019-07-05 DIAGNOSIS — E785 Hyperlipidemia, unspecified: Secondary | ICD-10-CM

## 2019-07-05 DIAGNOSIS — Z87891 Personal history of nicotine dependence: Secondary | ICD-10-CM

## 2019-07-05 DIAGNOSIS — E1122 Type 2 diabetes mellitus with diabetic chronic kidney disease: Secondary | ICD-10-CM

## 2019-07-05 DIAGNOSIS — A09 Infectious gastroenteritis and colitis, unspecified: Secondary | ICD-10-CM | POA: Diagnosis not present

## 2019-07-05 DIAGNOSIS — F0390 Unspecified dementia without behavioral disturbance: Secondary | ICD-10-CM

## 2019-07-05 DIAGNOSIS — F329 Major depressive disorder, single episode, unspecified: Secondary | ICD-10-CM

## 2019-08-13 ENCOUNTER — Ambulatory Visit: Payer: Medicare Other

## 2019-08-13 ENCOUNTER — Encounter: Payer: Medicare Other | Admitting: Family Medicine

## 2019-08-13 ENCOUNTER — Ambulatory Visit (INDEPENDENT_AMBULATORY_CARE_PROVIDER_SITE_OTHER): Payer: Medicare Other

## 2019-08-13 DIAGNOSIS — Z0000 Encounter for general adult medical examination without abnormal findings: Secondary | ICD-10-CM

## 2019-08-13 NOTE — Patient Instructions (Signed)
Jill [name] , Thank you for taking time to come for your Medicare Wellness Visit. I appreciate your ongoing commitment to your health goals. Please review the following plan we discussed and let me know if I can assist you in the future.   Screening recommendations/referrals: Colonoscopy: no longer required Mammogram: no longer required Bone Density: completed 02/13/2008 Recommended yearly ophthalmology/optometry visit for glaucoma screening and checkup Recommended yearly dental visit for hygiene and checkup  Vaccinations: Influenza vaccine: Up to date, completed 05/28/2019 Pneumococcal vaccine: Completed series Tdap vaccine: Up to date, completed 02/20/2014 Shingles vaccine: discussed    Advanced directives: Please bring a copy of your POA (Power of [place]) and/or Living Will to your next appointment.   Conditions/risks identified: diabetes, hypertension  Next appointment: 08/21/2019 @ 3 pm    Preventive Care 65 Years and Older, Female Preventive care refers to lifestyle choices and visits with your health care provider that can promote health and wellness. What does preventive care include?  A yearly physical exam. This is also called an annual well check.  Dental exams once or twice a year.  Routine eye exams. Ask your health care provider how often you should have your eyes checked.  Personal lifestyle choices, including:  Daily care of your teeth and gums.  Regular physical activity.  Eating a healthy diet.  Avoiding tobacco and drug use.  Limiting alcohol use.  Practicing safe sex.  Taking low-dose aspirin every day.  Taking vitamin and mineral supplements as recommended by your health care provider. What happens during an annual well check? The services and screenings done by your health care provider during your annual well check will depend on your age, overall health, lifestyle risk factors, and family history of disease. Counseling  Your health care  provider may ask you questions about your:  Alcohol use.  Tobacco use.  Drug use.  Emotional well-being.  Home and relationship well-being.  Sexual activity.  Eating habits.  History of falls.  Memory and ability to understand (cognition).  Work and work Statistician.  Reproductive health. Screening  You may have the following tests or measurements:  Height, weight, and BMI.  Blood pressure.  Lipid and cholesterol levels. These may be checked every 5 years, or more frequently if you are over 105 years old.  Skin check.  Lung cancer screening. You may have this screening every year starting at age 47 if you have a 30-pack-year history of smoking and currently smoke or have quit within the past 15 years.  Fecal occult blood test (FOBT) of the stool. You may have this test every year starting at age 32.  Flexible sigmoidoscopy or colonoscopy. You may have a sigmoidoscopy every 5 years or a colonoscopy every 10 years starting at age 74.  Hepatitis C blood test.  Hepatitis B blood test.  Sexually transmitted disease (STD) testing.  Diabetes screening. This is done by checking your blood sugar (glucose) after you have not eaten for a while (fasting). You may have this done every 1-3 years.  Bone density scan. This is done to screen for osteoporosis. You may have this done starting at age 88.  Mammogram. This may be done every 1-2 years. Talk to your health care provider about how often you should have regular mammograms. Talk with your health care provider about your test results, treatment options, and if necessary, the need for more tests. Vaccines  Your health care provider may recommend certain vaccines, such as:  Influenza vaccine. This is recommended every  year.  Tetanus, diphtheria, and acellular pertussis (Tdap, Td) vaccine. You may need a Td booster every 10 years.  Zoster vaccine. You may need this after age 59.  Pneumococcal 13-valent conjugate (PCV13)  vaccine. One dose is recommended after age 74.  Pneumococcal polysaccharide (PPSV23) vaccine. One dose is recommended after age 56. Talk to your health care provider about which screenings and vaccines you need and how often you need them. This information is not intended to replace advice given to you by your health care provider. Make sure you discuss any questions you have with your health care provider. Document Released: 09/04/2015 Document Revised: 04/27/2016 Document Reviewed: 06/09/2015 Elsevier Interactive Patient Education  2017 [employer] Prevention in the Home Falls can cause injuries. They can happen to people of all ages. There are many things you can do to make your home safe and to help prevent falls. What can I do on the outside of my home?  Regularly fix the edges of walkways and driveways and fix any cracks.  Remove anything that might make you trip as you walk through a door, such as a raised step or threshold.  Trim any bushes or trees on the path to your home.  Use bright outdoor lighting.  Clear any walking paths of anything that might make someone trip, such as rocks or tools.  Regularly check to see if handrails are loose or broken. Make sure that both sides of any steps have handrails.  Any raised decks and porches should have guardrails on the edges.  Have any leaves, snow, or ice cleared regularly.  Use sand or salt on walking paths during winter.  Clean up any spills in your garage right away. This includes oil or grease spills. What can I do in the bathroom?  Use night lights.  Install grab bars by the toilet and in the tub and shower. Do not use towel bars as grab bars.  Use non-skid mats or decals in the tub or shower.  If you need to sit down in the shower, use a plastic, non-slip stool.  Keep the floor dry. Clean up any water that spills on the floor as soon as it happens.  Remove soap buildup in the tub or shower  regularly.  Attach bath mats securely with double-sided non-slip rug tape.  Do not have throw rugs and other things on the floor that can make you trip. What can I do in the bedroom?  Use night lights.  Make sure that you have a light by your bed that is easy to reach.  Do not use any sheets or blankets that are too big for your bed. They should not hang down onto the floor.  Have a firm chair that has side arms. You can use this for support while you get dressed.  Do not have throw rugs and other things on the floor that can make you trip. What can I do in the kitchen?  Clean up any spills right away.  Avoid walking on wet floors.  Keep items that you use a lot in easy-to-reach places.  If you need to reach something above you, use a strong step stool that has a grab bar.  Keep electrical cords out of the way.  Do not use floor polish or wax that makes floors slippery. If you must use wax, use non-skid floor wax.  Do not have throw rugs and other things on the floor that can make you trip. What can  I do with my stairs?  Do not leave any items on the stairs.  Make sure that there are handrails on both sides of the stairs and use them. Fix handrails that are broken or loose. Make sure that handrails are as long as the stairways.  Check any carpeting to make sure that it is firmly attached to the stairs. Fix any carpet that is loose or worn.  Avoid having throw rugs at the top or bottom of the stairs. If you do have throw rugs, attach them to the floor with carpet tape.  Make sure that you have a light switch at the top of the stairs and the bottom of the stairs. If you do not have them, ask someone to add them for you. What else can I do to help prevent falls?  Wear shoes that:  Do not have high heels.  Have rubber bottoms.  Are comfortable and fit you well.  Are closed at the toe. Do not wear sandals.  If you use a stepladder:  Make sure that it is fully  opened. Do not climb a closed stepladder.  Make sure that both sides of the stepladder are locked into place.  Ask someone to hold it for you, if possible.  Clearly mark and make sure that you can see:  Any grab bars or handrails.  First and last steps.  Where the edge of each step is.  Use tools that help you move around (mobility aids) if they are needed. These include:  Canes.  Walkers.  Scooters.  Crutches.  Turn on the lights when you go into a dark area. Replace any light bulbs as soon as they burn out.  Set up your furniture so you have a clear path. Avoid moving your furniture around.  If any of your floors are uneven, fix them.  If there are any pets around you, be aware of where they are.  Review your medicines with your doctor. Some medicines can make you feel dizzy. This can increase your chance of falling. Ask your doctor what other things that you can do to help prevent falls. This information is not intended to replace advice given to you by your health care provider. Make sure you discuss any questions you have with your health care provider. Document Released: 06/04/2009 Document Revised: 01/14/2016 Document Reviewed: 09/12/2014 Elsevier Interactive Patient Education  2017 [employer].

## 2019-08-13 NOTE — Progress Notes (Signed)
PCP notes:  Health Maintenance: No gaps noted   Abnormal Screenings: none   Patient concerns: none   Nurse concerns: none   Next PCP appt.: 08/21/2019 @ 3 pm

## 2019-08-13 NOTE — Progress Notes (Addendum)
Subjective:   Jill [name] is a 85 y.o. female who presents for Medicare Annual (Subsequent) preventive examination.  Review of Systems: N/A   This visit is being conducted through telemedicine via telephone at the nurse health advisor's home address due to the COVID-19 pandemic. This patient has given me verbal consent via doximity to conduct this visit, patient states they are participating from their home address. Patient and myself are on the telephone call. There is no referral for this visit. Some vital signs may be absent or patient reported.    Patient identification: identified by name, DOB, and current address   Cardiac Risk Factors include: advanced age (>66men, >67 women);diabetes mellitus;hypertension     Objective:     Vitals: There were no vitals taken for this visit.  There is no height or weight on file to calculate BMI.  Advanced Directives 08/13/2019 05/17/2019 08/08/2018 05/31/2018 07/15/2017 07/03/2017 08/08/2016  Does Patient Have a Medical Advance Directive? Yes Yes No No - Yes No  Type of Paramedic of [place];Living will Living will - - [place];Living will [place];Living will -  Does patient want to make changes to medical advance directive? - No - Patient declined - - - - -  Copy of [place] in Chart? No - copy requested - - - - No - copy requested -  Would patient like information on creating a medical advance directive? - - Yes (MAU/Ambulatory/Procedural Areas - Information given) No - Patient declined - - No - Patient declined    Tobacco Social History   Tobacco Use  Smoking Status Former Smoker  . Packs/day: 1.00  . Years: 45.00  . Pack years: 45.00  . Types: Cigarettes  . Quit date: 06/16/1999  . Years since quitting: 20.1  Smokeless Tobacco Never Used  Tobacco Comment   quit 15 years ago     Counseling given: Not Answered Comment: quit 15 years  ago   Clinical Intake:  Pre-visit preparation completed: Yes  Pain : No/denies pain     Nutritional Risks: None Diabetes: Yes CBG done?: No Did pt. bring in CBG monitor from home?: No  How often do you need to have someone help you when you read instructions, pamphlets, or other written materials from your doctor or pharmacy?: 1 - Never What is the last grade level you completed in school?: masters  Interpreter Needed?: No  Information entered by :: CJohnson, LPN  Past Medical History:  Diagnosis Date  . Allergy   . Arthritis    osteoarthritis   . Chronic kidney disease   . Complication of anesthesia    difficult waking   . Depression   . Diabetes mellitus   . Diverticula, colon   . Family history of anesthesia complication    Son is difficult intubation  . Hyperlipidemia   . Hypertension   . Vasovagal syncope 2006   Negative cardiac workup-myoview, echo   Past Surgical History:  Procedure Laterality Date  . ABDOMINAL HYSTERECTOMY  1977   fibroid  . JOINT REPLACEMENT  2009   rt hip  . TOTAL HIP ARTHROPLASTY Left 05/12/2014   dr Mayer Camel  . TOTAL HIP ARTHROPLASTY Left 05/12/2014   Procedure: TOTAL HIP ARTHROPLASTY;  Surgeon: Kerin Salen, MD;  Location: [facility];  Service: Orthopedics;  Laterality: Left;   Family History  Problem Relation Age of Onset  . COPD Brother   . Heart disease Brother   .  Diabetes Brother   . Lymphoma Brother   . Alzheimer's disease Brother   . Pancreatic cancer Sister   . Alzheimer's disease Sister   . Heart disease Mother   . Breast cancer Sister   . Leukemia Other   . Kidney disease Neg Hx    Social History   Socioeconomic History  . Marital status: Widowed    Spouse name: Not on file  . Number of children: Not on file  . Years of education: Not on file  . Highest education level: Not on file  Occupational History  . Not on file  Tobacco Use  . Smoking status: Former Smoker    Packs/day: 1.00    Years: 45.00    Pack  years: 45.00    Types: Cigarettes    Quit date: 06/16/1999    Years since quitting: 20.1  . Smokeless tobacco: Never Used  . Tobacco comment: quit 15 years ago  Substance and Sexual Activity  . Alcohol use: No    Alcohol/week: 0.0 standard drinks  . Drug use: No  . Sexual activity: Not Currently  Other Topics Concern  . 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 Diet: healthy diet      Son Antony Haste [place]) [place] , has living will and DNR ( reviewed 2015)               Social Determinants of Health   Financial Resource Strain: Low Risk   . Difficulty of Paying Living Expenses: Not hard at all  Food Insecurity: No Food Insecurity  . Worried About Charity fundraiser in the Last Year: Never true  . Ran Out of Food in the Last Year: Never true  Transportation Needs: No Transportation Needs  . Lack of Transportation (Medical): No  . Lack of Transportation (Non-Medical): No  Physical Activity: Inactive  . Days of Exercise per Week: 0 days  . Minutes of Exercise per Session: 0 min  Stress: No Stress Concern Present  . Feeling of Stress : Not at all  Social Connections:   . Frequency of Communication with Friends and Family: Not on file  . Frequency of Social Gatherings with Friends and Family: Not on file  . Attends Religious Services: Not on file  . Active Member of Clubs or Organizations: Not on file  . Attends Archivist Meetings: Not on file  . Marital Status: Not on file    Outpatient Encounter Medications as of 08/13/2019  Medication Sig  . albuterol (PROVENTIL HFA;VENTOLIN HFA) 108 (90 Base) MCG/ACT inhaler Inhale 2 puffs into the lungs every 4 (four) hours as needed for wheezing or shortness of  breath.  . ALPRAZolam (XANAX) 0.25 MG tablet Take 1 tablet (0.25 mg total) by mouth 2 (two) times daily as needed for anxiety.  . budesonide-formoterol (SYMBICORT) 160-4.5 MCG/ACT inhaler Inhale 2 puffs into the lungs 2 (two) times daily.  . diphenhydrAMINE (BENADRYL) 25 MG tablet Take 25 mg by mouth every 6 (six) hours as needed for itching.  . escitalopram (LEXAPRO) 20 MG tablet TAKE 1 TABLET BY MOUTH EVERY DAY  . fenofibrate 54 MG tablet TAKE ONE TABLET(54MG ) BY MOUTH TWICE DAILY  . midodrine (PROAMATINE) 5 MG tablet  Take 5 mg by mouth daily.  . Multiple Vitamins-Minerals (MULTIVITAMIN WITH MINERALS) tablet Take 1 tablet by mouth daily.  Marland Kitchen nystatin (MYCOSTATIN/NYSTOP) powder Apply topically 4 (four) times daily.  Marland Kitchen SPIRIVA HANDIHALER 18 MCG inhalation capsule INHALE 1 CAPSULE VIA HANDIHALER ONCE DAILY AT THE SAME TIME EVERY DAY  . Vitamin D, Ergocalciferol, (DRISDOL) 1.25 MG (50000 UT) CAPS capsule Take 1 capsule (50,000 Units total) by mouth every Friday.   No facility-administered encounter medications on file as of 08/13/2019.    Activities of Daily Living In your present state of health, do you have any difficulty performing the following activities: 08/13/2019  Hearing? N  Vision? N  Difficulty concentrating or making decisions? N  Walking or climbing stairs? Y  Comment has to go slower  Dressing or bathing? N  Doing errands, shopping? N  Preparing Food and eating ? N  Using the Toilet? N  In the past six months, have you accidently leaked urine? N  Do you have problems with loss of bowel control? N  Managing your Medications? N  Managing your Finances? N  Housekeeping or managing your Housekeeping? N  Some recent data might be hidden    Patient Care Team: Jinny Sanders, MD as PCP - General (Family Medicine)    Assessment:   This is a routine wellness examination for [place].  Exercise Activities and Dietary recommendations Current Exercise Habits: The patient does not  participate in regular exercise at present, Exercise limited by: None identified  Goals    . Patient Stated     Starting 08/08/18, I will continue to take medications as prescribed.     . Patient Stated     08/13/2019, I will maintain and continue medications as prescribed.        Fall Risk Fall Risk  08/13/2019 08/08/2018 07/03/2017 06/30/2016 06/02/2015  Falls in the past year? 0 0 Yes Yes No  Comment - - 2-3 falls in past year due to feet "getting tangled up" - -  Number falls in past yr: 0 - 2 or more 1 -  Injury with Fall? 0 - Yes Yes -  Risk Factor Category  - - - - -  Risk for fall due to : Medication side effect;History of fall(s) - - Impaired balance/gait -  Follow up Falls prevention discussed;Falls evaluation completed - - Falls evaluation completed -   Is the patient's home free of loose throw rugs in walkways, pet beds, electrical cords, etc?   yes      Grab bars in the bathroom? yes      Handrails on the stairs?   yes      Adequate lighting?   yes  Timed Get Up and Go performed: N/A  Depression Screen PHQ 2/9 Scores 08/13/2019 08/08/2018 07/03/2017 06/30/2016  PHQ - 2 Score 0 0 0 0  PHQ- 9 Score 0 0 0 -     Cognitive Function MMSE - Mini Mental State Exam 08/13/2019 08/08/2018 07/03/2017 06/30/2016  Orientation to time 5 5 5 5   Orientation to Place 5 5 5 5   Registration 3 3 3 3   Attention/ Calculation 5 0 0 0  Recall 3 2 3 3   Recall-comments - unable to recall 1 of 3 words - -  Language- name 2 objects - 0 0 0  Language- repeat 1 1 1 1   Language- follow 3 step command - 3 3 3   Language- read & follow direction - 0 0 0  Write a sentence -  0 0 0  Copy design - 0 0 0  Total score - 19 20 20   Mini Cog  Mini-Cog screen was completed. Maximum score is 22. A value of 0 denotes this part of the MMSE was not completed or the patient failed this part of the Mini-Cog screening.       Immunization History  Administered Date(s) Administered  . Fluad Quad(high  Dose 65+) 05/28/2019  . H1N1 08/18/2008  . Influenza Whole 06/16/2007  . Influenza, High Dose Seasonal PF 07/03/2017, 06/01/2018  . Influenza,inj,Quad PF,6+ Mos 05/14/2014, 05/05/2015, 06/24/2016  . Influenza-Unspecified 06/22/2013  . Pneumococcal Conjugate-13 02/20/2014  . Pneumococcal Polysaccharide-23 01/20/1997, 06/24/2016  . Td 02/20/2003  . Tdap 02/20/2014    Qualifies for Shingles Vaccine? yes  Screening Tests Health Maintenance  Topic Date Due  . URINE MICROALBUMIN  08/14/2019  . FOOT EXAM  08/14/2019  . OPHTHALMOLOGY EXAM  11/21/2019  . HEMOGLOBIN A1C  11/26/2019  . TETANUS/TDAP  02/21/2024  . INFLUENZA VACCINE  Completed  . DEXA SCAN  Completed  . PNA vac Low Risk Adult  Completed    Cancer Screenings: Lung: Low Dose CT Chest recommended if Age 46-80 years, 30 pack-year currently smoking OR have quit w/in 15years. Patient does not qualify. Breast:  Up to date on Mammogram? No longer required   Up to date of Bone Density/Dexa? Completed 02/13/2008 Colorectal: no longer required  Additional Screenings:  Hepatitis C Screening: N/A     Plan:    Patient will maintain and continue medications as prescribed.    I have personally reviewed and noted the following in the patient's chart:   . Medical and social history . Use of alcohol, tobacco or illicit drugs  . Current medications and supplements . Functional ability and status . Nutritional status . Physical activity . Advanced directives . List of other physicians . Hospitalizations, surgeries, and ER visits in previous 12 months . Vitals . Screenings to include cognitive, depression, and falls . Referrals and appointments  In addition, I have reviewed and discussed with patient certain preventive protocols, quality metrics, and best practice recommendations. A written personalized care plan for preventive services as well as general preventive health recommendations were provided to patient.     Andrez Grime, LPN  [id number]

## 2019-08-21 ENCOUNTER — Encounter: Payer: Self-pay | Admitting: Family Medicine

## 2019-08-21 ENCOUNTER — Ambulatory Visit (INDEPENDENT_AMBULATORY_CARE_PROVIDER_SITE_OTHER): Payer: Medicare Other | Admitting: Family Medicine

## 2019-08-21 ENCOUNTER — Other Ambulatory Visit: Payer: Self-pay

## 2019-08-21 VITALS — BP 146/63 | HR 79 | Temp 98.0°F | Ht 62.0 in | Wt 168.2 lb

## 2019-08-21 DIAGNOSIS — I5032 Chronic diastolic (congestive) heart failure: Secondary | ICD-10-CM | POA: Diagnosis not present

## 2019-08-21 DIAGNOSIS — D631 Anemia in chronic kidney disease: Secondary | ICD-10-CM

## 2019-08-21 DIAGNOSIS — Z0000 Encounter for general adult medical examination without abnormal findings: Secondary | ICD-10-CM | POA: Diagnosis not present

## 2019-08-21 DIAGNOSIS — J449 Chronic obstructive pulmonary disease, unspecified: Secondary | ICD-10-CM

## 2019-08-21 DIAGNOSIS — E1122 Type 2 diabetes mellitus with diabetic chronic kidney disease: Secondary | ICD-10-CM

## 2019-08-21 DIAGNOSIS — N184 Chronic kidney disease, stage 4 (severe): Secondary | ICD-10-CM

## 2019-08-21 DIAGNOSIS — R413 Other amnesia: Secondary | ICD-10-CM

## 2019-08-21 DIAGNOSIS — F3341 Major depressive disorder, recurrent, in partial remission: Secondary | ICD-10-CM

## 2019-08-21 DIAGNOSIS — N183 Chronic kidney disease, stage 3 unspecified: Secondary | ICD-10-CM

## 2019-08-21 DIAGNOSIS — E1140 Type 2 diabetes mellitus with diabetic neuropathy, unspecified: Secondary | ICD-10-CM | POA: Diagnosis not present

## 2019-08-21 DIAGNOSIS — I10 Essential (primary) hypertension: Secondary | ICD-10-CM

## 2019-08-21 LAB — HM DIABETES FOOT EXAM

## 2019-08-21 NOTE — Patient Instructions (Addendum)
Can stay off Symbicort.  Can stop fenofibrate.  Can try stopping midodrine but if BP dropping low or lightheaded.. restart.  Consider following up with Dr. Candiss Norse for yearly check.   Please stop at the lab to have labs drawn to look into memory issues.   Consider bone density and let me know.

## 2019-08-21 NOTE — Progress Notes (Signed)
Chief Complaint  Patient presents with  . Annual Exam    Part 2    History of Present Illness: HPI   The patient presents for complete physical and review of chronic health problems. He/She also has the following acute concerns today:  worsening memeory issues.  The patient saw a LPN or RN for medicare wellness visit.  Prevention and wellness was reviewed in detail. Note reviewed and important notes copied below. Health Maintenance: No gaps noted   Abnormal Screenings: none  08/21/19   Grand-daughter and [patient have  noted that memory has been getting worse lately in last several months.  Having to leave notes to remember things.  Family having to repeat self a lot.  No new urinary symptoms, no new rash or sign of infection. breathing stable. Hypertension / Hypotension:   At goal on current regimen.      BP Readings from Last 3 Encounters:  08/21/19 (!) 146/63  05/28/19 (!) 144/80  05/17/19 (!) 134/59  Using medication without problems or lightheadedness:  none Chest pain with exertion: none Edema: stable Short of breath: stable Average home BPs: good control. Other issues:  Hg improved at 11.6 05/2019  CKD.. follow up wit Dr. Candiss Norse  Elevated Cholesterol:  On fenofibrate Consider stopping eval and stopping medication. Using medications without problems:none Muscle aches: none Diet compliance: healthy eating.Marland Kitchen trying to eat 3 meals a day Exercise:  Little bit of walking. Other complaints:   Wt Readings from Last 3 Encounters:  08/21/19 168 lb 4 oz (76.3 kg)  05/28/19 166 lb 4 oz (75.4 kg)  05/17/19 181 lb 8 oz (82.3 kg)     Mild COPD: on spiriva this helps a lot but not taking Symbicort ( fairly stable off this), albuterol prn  Diabetes:  Good control for age on  Diet control Lab Results  Component Value Date   HGBA1C 7.1 (H) 05/28/2019  Using medications without difficulties: Hypoglycemic episodes: Hyperglycemic episodes: Feet problems: no  ulcers Blood Sugars averaging: no checking at home eye exam within last year: 11/2018  CKD: Followed by nephrology  GFR 23-27 at  New baseline.   MDD, recurrent: on lexapro   Clinical Support from 08/13/2019 in [employer] [facility] Total Score  0          This visit occurred during the SARS-CoV-2 public health emergency.  Safety protocols were in place, including screening questions prior to the visit, additional usage of staff PPE, and extensive cleaning of exam room while observing appropriate contact time as indicated for disinfecting solutions.   COVID 19 screen:  No recent travel or known exposure to COVID19 The patient denies respiratory symptoms of COVID 19 at this time. The importance of social distancing was discussed today.     Review of Systems  Constitutional: Negative for chills and fever.  HENT: Negative for congestion and ear pain.   Eyes: Negative for pain and redness.  Respiratory: Negative for cough and shortness of breath.   Cardiovascular: Negative for chest pain, palpitations and leg swelling.  Gastrointestinal: Negative for abdominal pain, blood in stool, constipation, diarrhea, nausea and vomiting.  Genitourinary: Negative for dysuria.  Musculoskeletal: Negative for falls and myalgias.  Skin: Negative for rash.  Neurological: Negative for dizziness.  Psychiatric/Behavioral: Positive for memory loss. Negative for depression. The patient is not nervous/anxious.       Past Medical History:  Diagnosis Date  . Allergy   . Arthritis    osteoarthritis   .  Chronic kidney disease   . Complication of anesthesia    difficult waking   . Depression   . Diabetes mellitus   . Diverticula, colon   . Family history of anesthesia complication    Son is difficult intubation  . Hyperlipidemia   . Hypertension   . Vasovagal syncope 2006   Negative cardiac workup-myoview, echo    reports that she quit smoking about 20 years ago. Her  smoking use included cigarettes. She has a 45.00 pack-year smoking history. She has never used smokeless tobacco. She reports that she does not drink alcohol or use drugs.   Current Outpatient Medications:  .  albuterol (PROVENTIL HFA;VENTOLIN HFA) 108 (90 Base) MCG/ACT inhaler, Inhale 2 puffs into the lungs every 4 (four) hours as needed for wheezing or shortness of breath., Disp: 1 Inhaler, Rfl: 1 .  ALPRAZolam (XANAX) 0.25 MG tablet, Take 1 tablet (0.25 mg total) by mouth 2 (two) times daily as needed for anxiety., Disp: , Rfl:  .  budesonide-formoterol (SYMBICORT) 160-4.5 MCG/ACT inhaler, Inhale 2 puffs into the lungs 2 (two) times daily., Disp: 1 Inhaler, Rfl: 3 .  diphenhydrAMINE (BENADRYL) 25 MG tablet, Take 25 mg by mouth every 6 (six) hours as needed for itching., Disp: , Rfl:  .  escitalopram (LEXAPRO) 20 MG tablet, TAKE 1 TABLET BY MOUTH EVERY DAY, Disp: 90 tablet, Rfl: 1 .  fenofibrate 54 MG tablet, TAKE ONE TABLET(54MG ) BY MOUTH TWICE DAILY, Disp: 180 tablet, Rfl: 2 .  midodrine (PROAMATINE) 5 MG tablet, Take 5 mg by mouth daily., Disp: , Rfl:  .  Multiple Vitamins-Minerals (MULTIVITAMIN WITH MINERALS) tablet, Take 1 tablet by mouth daily., Disp: , Rfl:  .  nystatin (MYCOSTATIN/NYSTOP) powder, Apply topically 4 (four) times daily., Disp: 56.7 g, Rfl: 3 .  SPIRIVA HANDIHALER 18 MCG inhalation capsule, INHALE 1 CAPSULE VIA HANDIHALER ONCE DAILY AT THE SAME TIME EVERY DAY, Disp: 30 capsule, Rfl: 5 .  Vitamin D, Ergocalciferol, (DRISDOL) 1.25 MG (50000 UT) CAPS capsule, Take 1 capsule (50,000 Units total) by mouth every Friday., Disp: 12 capsule, Rfl: 3   Observations/Objective: Blood pressure (!) 146/63, pulse 79, temperature 98 F (36.7 C), temperature source Temporal, height 5\' 2"  (1.575 m), weight 168 lb 4 oz (76.3 kg), SpO2 95 %.  Physical Exam Constitutional:      General: She is not in acute distress.    Appearance: Normal appearance. She is well-developed. She is obese. She is  not ill-appearing or toxic-appearing.     Comments: elderly  HENT:     Head: Normocephalic.     Right Ear: Hearing, tympanic membrane, ear canal and external ear normal. Tympanic membrane is not erythematous, retracted or bulging.     Left Ear: Hearing, tympanic membrane, ear canal and external ear normal. Tympanic membrane is not erythematous, retracted or bulging.     Nose: No mucosal edema or rhinorrhea.     Right Sinus: No maxillary sinus tenderness or frontal sinus tenderness.     Left Sinus: No maxillary sinus tenderness or frontal sinus tenderness.     Mouth/Throat:     Pharynx: Uvula midline.  Eyes:     General: Lids are normal. Lids are everted, no foreign bodies appreciated.     Conjunctiva/sclera: Conjunctivae normal.     Pupils: Pupils are equal, round, and reactive to light.  Neck:     Thyroid: No thyroid mass or thyromegaly.     Vascular: No carotid bruit.     Trachea: Trachea normal.  Cardiovascular:     Rate and Rhythm: Normal rate and regular rhythm.     Pulses: Normal pulses.     Heart sounds: Normal heart sounds, S1 normal and S2 normal. No murmur. No friction rub. No gallop.   Pulmonary:     Effort: Pulmonary effort is normal. No tachypnea or respiratory distress.     Breath sounds: Normal breath sounds. No decreased breath sounds, wheezing, rhonchi or rales.  Abdominal:     General: Bowel sounds are normal.     Palpations: Abdomen is soft.     Tenderness: There is no abdominal tenderness.  Musculoskeletal:     Cervical back: Normal range of motion and neck supple.  Skin:    General: Skin is warm and dry.     Findings: No rash.  Neurological:     Mental Status: She is alert.  Psychiatric:        Mood and Affect: Mood is not anxious or depressed.        Speech: Speech normal.        Behavior: Behavior normal. Behavior is cooperative.        Thought Content: Thought content normal.        Judgment: Judgment normal.      Diabetic foot exam: Normal  inspection No skin breakdown No calluses  Normal DP pulses decreased sensation to light touch and monofilament on right foot at toes Nails normal  Assessment and Plan   The patient's preventative maintenance and recommended screening tests for an annual wellness exam were reviewed in full today. Brought up to date unless services declined.  Counselled on the importance of diet, exercise, and its role in overall health and mortality. The patient's FH and SH was reviewed, including their home life, tobacco status, and drug and alcohol status.   Reviewed vaccines No indication for colonoscopy, mammogram, PAP/DVE DEXA: refused. Last normal in 2009.  Chronic diastolic congestive heart failure (HCC)  BP controlled.  HYPERTENSION, BENIGN SYSTEMIC  Tolerable control for age. She is still taking midodrine for hypotension. .Can try stopping midodrine but if BP dropping low or lightheaded.. restart.   CKD (chronic kidney disease) stage 4, GFR 15-29 ml/min (HCC) Followed by Dr. Candiss Norse  Major depression, recurrent Well controlled. Continue current medication.   Memory loss Eval for secondary causes with additional labs today.  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  Type 2 diabetes mellitus with diabetic neuropathy, unspecified ([place]) Diet controlled.  DM causing neuropathy as well as nephropathy.  Type 2 diabetes mellitus with stage 3 chronic kidney disease (HCC)  CKD followed by renal.  Stable today.    Eliezer Lofts, MD

## 2019-08-22 LAB — COMPREHENSIVE METABOLIC PANEL
ALT: 9 U/L (ref 0–35)
AST: 19 U/L (ref 0–37)
Albumin: 4 g/dL (ref 3.5–5.2)
Alkaline Phosphatase: 49 U/L (ref 39–117)
BUN: 36 mg/dL — ABNORMAL HIGH (ref 6–23)
CO2: 29 mEq/L (ref 19–32)
Calcium: 9.8 mg/dL (ref 8.4–10.5)
Chloride: 104 mEq/L (ref 96–112)
Creatinine, Ser: 2.03 mg/dL — ABNORMAL HIGH (ref 0.40–1.20)
GFR: 23.25 mL/min — ABNORMAL LOW (ref >60.00)
Glucose, Bld: 121 mg/dL — ABNORMAL HIGH (ref 70–99)
Potassium: 4.7 mEq/L (ref 3.5–5.1)
Sodium: 140 mEq/L (ref 135–145)
Total Bilirubin: 0.3 mg/dL (ref 0.2–1.2)
Total Protein: 6.9 g/dL (ref 6.0–8.3)

## 2019-08-22 LAB — CBC WITH DIFFERENTIAL/PLATELET
Basophils Absolute: 0.2 10*3/uL — ABNORMAL HIGH (ref 0.0–0.1)
Basophils Relative: 1.7 % (ref 0.0–3.0)
Eosinophils Absolute: 0.1 10*3/uL (ref 0.0–0.7)
Eosinophils Relative: 1.1 % (ref 0.0–5.0)
HCT: 37.6 % (ref 36.0–46.0)
Hemoglobin: 12.3 g/dL (ref 12.0–15.0)
Lymphocytes Relative: 54.1 % — ABNORMAL HIGH (ref 12.0–46.0)
Lymphs Abs: 5.6 10*3/uL — ABNORMAL HIGH (ref 0.7–4.0)
MCHC: 32.7 g/dL (ref 30.0–36.0)
MCV: 94.8 fl (ref 78.0–100.0)
Monocytes Absolute: 0.9 10*3/uL (ref 0.1–1.0)
Monocytes Relative: 8.4 % (ref 3.0–12.0)
Neutro Abs: 3.6 10*3/uL (ref 1.4–7.7)
Neutrophils Relative %: 34.7 % — ABNORMAL LOW (ref 43.0–77.0)
Platelets: 228 10*3/uL (ref 150.0–400.0)
RBC: 3.97 Mil/uL (ref 3.87–5.11)
RDW: 13.5 % (ref 11.5–15.5)
WBC: 10.4 10*3/uL (ref 4.0–10.5)

## 2019-08-22 LAB — TSH: TSH: 3.68 u[IU]/mL (ref 0.35–4.50)

## 2019-08-22 LAB — VITAMIN D 25 HYDROXY (VIT D DEFICIENCY, FRACTURES): VITD: 80.48 ng/mL (ref 30.00–100.00)

## 2019-08-22 LAB — VITAMIN B12: Vitamin B-12: 381 pg/mL (ref 211–911)

## 2019-09-19 NOTE — Assessment & Plan Note (Addendum)
Tolerable control for age. She is still taking midodrine for hypotension. .Can try stopping midodrine but if BP dropping low or lightheaded.. restart.

## 2019-09-19 NOTE — Assessment & Plan Note (Signed)
B/P controlled 

## 2019-09-19 NOTE — Assessment & Plan Note (Signed)
Eval for secondary causes with additional labs today.

## 2019-09-19 NOTE — Assessment & Plan Note (Addendum)
Diet controlled.  DM causing neuropathy as well as nephropathy.

## 2019-09-19 NOTE — Assessment & Plan Note (Signed)
Well controlled. Continue current medication.  

## 2019-09-19 NOTE — Assessment & Plan Note (Signed)
Tolerable control. 

## 2019-09-19 NOTE — Assessment & Plan Note (Signed)
CKD followed by renal.  Stable today.

## 2019-09-19 NOTE — Assessment & Plan Note (Signed)
Followed by Dr Singh.   

## 2019-10-09 ENCOUNTER — Other Ambulatory Visit: Payer: Self-pay | Admitting: Family Medicine

## 2019-10-09 NOTE — Telephone Encounter (Signed)
See refill request.

## 2019-10-09 NOTE — Telephone Encounter (Signed)
Last office visit 08/21/2019 for CPE.  Last refilled 08/10/2018 for #12 with 3 refills.  Last Vit D level 08/21/2019 which was normal at 80.48 ng/ml.  Next Appt: 11/19/2019 for 3 month follow up.

## 2019-10-28 LAB — HM DIABETES FOOT EXAM

## 2019-11-11 ENCOUNTER — Telehealth: Payer: Self-pay | Admitting: Family Medicine

## 2019-11-11 DIAGNOSIS — N184 Chronic kidney disease, stage 4 (severe): Secondary | ICD-10-CM

## 2019-11-11 DIAGNOSIS — E1140 Type 2 diabetes mellitus with diabetic neuropathy, unspecified: Secondary | ICD-10-CM

## 2019-11-11 DIAGNOSIS — D631 Anemia in chronic kidney disease: Secondary | ICD-10-CM

## 2019-11-11 NOTE — Telephone Encounter (Signed)
-----   Message from Ellamae Sia sent at 10/30/2019 12:03 PM EST ----- Regarding: lab orders for Tuesday, 3.23.21 Lab orders, thanks

## 2019-11-12 ENCOUNTER — Other Ambulatory Visit (INDEPENDENT_AMBULATORY_CARE_PROVIDER_SITE_OTHER): Payer: Medicare PPO

## 2019-11-12 ENCOUNTER — Other Ambulatory Visit: Payer: Self-pay

## 2019-11-12 DIAGNOSIS — E1140 Type 2 diabetes mellitus with diabetic neuropathy, unspecified: Secondary | ICD-10-CM

## 2019-11-12 LAB — COMPREHENSIVE METABOLIC PANEL
ALT: 7 U/L (ref 0–35)
AST: 15 U/L (ref 0–37)
Albumin: 3.5 g/dL (ref 3.5–5.2)
Alkaline Phosphatase: 77 U/L (ref 39–117)
BUN: 28 mg/dL — ABNORMAL HIGH (ref 6–23)
CO2: 27 mEq/L (ref 19–32)
Calcium: 9.1 mg/dL (ref 8.4–10.5)
Chloride: 105 mEq/L (ref 96–112)
Creatinine, Ser: 1.68 mg/dL — ABNORMAL HIGH (ref 0.40–1.20)
GFR: 28.91 mL/min — ABNORMAL LOW (ref >60.00)
Glucose, Bld: 244 mg/dL — ABNORMAL HIGH (ref 70–99)
Potassium: 4.1 mEq/L (ref 3.5–5.1)
Sodium: 138 mEq/L (ref 135–145)
Total Bilirubin: 0.4 mg/dL (ref 0.2–1.2)
Total Protein: 6.3 g/dL (ref 6.0–8.3)

## 2019-11-12 LAB — HEMOGLOBIN A1C: Hgb A1c MFr Bld: 6.8 % — ABNORMAL HIGH (ref 4.6–6.5)

## 2019-11-12 NOTE — Progress Notes (Signed)
No critical labs need to be addressed urgently. We will discuss labs in detail at upcoming office visit.   

## 2019-11-14 ENCOUNTER — Other Ambulatory Visit: Payer: Medicare PPO

## 2019-11-14 DIAGNOSIS — E1140 Type 2 diabetes mellitus with diabetic neuropathy, unspecified: Secondary | ICD-10-CM | POA: Diagnosis not present

## 2019-11-14 LAB — MICROALBUMIN / CREATININE URINE RATIO
Creatinine,U: 120.1 mg/dL
Microalb Creat Ratio: 13.6 mg/g (ref 0.0–30.0)
Microalb, Ur: 16.3 mg/dL — ABNORMAL HIGH (ref 0.0–1.9)

## 2019-11-19 ENCOUNTER — Encounter: Payer: Self-pay | Admitting: Family Medicine

## 2019-11-19 ENCOUNTER — Ambulatory Visit: Payer: Medicare PPO | Admitting: Family Medicine

## 2019-11-19 ENCOUNTER — Other Ambulatory Visit: Payer: Self-pay

## 2019-11-19 DIAGNOSIS — I10 Essential (primary) hypertension: Secondary | ICD-10-CM

## 2019-11-19 DIAGNOSIS — N183 Chronic kidney disease, stage 3 unspecified: Secondary | ICD-10-CM

## 2019-11-19 DIAGNOSIS — E1122 Type 2 diabetes mellitus with diabetic chronic kidney disease: Secondary | ICD-10-CM | POA: Diagnosis not present

## 2019-11-19 DIAGNOSIS — E1140 Type 2 diabetes mellitus with diabetic neuropathy, unspecified: Secondary | ICD-10-CM | POA: Diagnosis not present

## 2019-11-19 NOTE — Patient Instructions (Addendum)
Set up yearly eye exam. Work on increasing activity.

## 2019-11-19 NOTE — Progress Notes (Signed)
Chief Complaint  Patient presents with  . Follow-up    3 month DM f/u    History of Present Illness: HPI   86 year old female presents for follow up DM 6 months.  Diabetes:   Improved control for last check with diet. Lab Results  Component Value Date   HGBA1C 6.8 (H) 11/12/2019  Using medications without difficulties: Hypoglycemic episodes:none Hyperglycemic episodes:none Feet problems: no ulcers Blood Sugars averaging:not checking eye exam within last year:  DM causing neuropathy as well as nephropathy. Improved Creatinine to 1.68 Normal microalb/cre ratio.   Tolerable blood pressure control  BP Readings from Last 3 Encounters:  11/19/19 (!) 142/78  08/21/19 (!) 146/63  05/28/19 (!) 144/80     This visit occurred during the SARS-CoV-2 public health emergency.  Safety protocols were in place, including screening questions prior to the visit, additional usage of staff PPE, and extensive cleaning of exam room while observing appropriate contact time as indicated for disinfecting solutions.   COVID 19 screen:  No recent travel or known exposure to COVID19 The patient denies respiratory symptoms of COVID 19 at this time. The importance of social distancing was discussed today.     Review of Systems  Constitutional: Negative for chills and fever.  HENT: Negative for congestion and ear pain.   Eyes: Negative for pain and redness.  Respiratory: Negative for cough and shortness of breath.   Cardiovascular: Negative for chest pain, palpitations and leg swelling.  Gastrointestinal: Negative for abdominal pain, blood in stool, constipation, diarrhea, nausea and vomiting.  Genitourinary: Negative for dysuria.  Musculoskeletal: Negative for falls and myalgias.  Skin: Negative for rash.  Neurological: Negative for dizziness.  Psychiatric/Behavioral: Negative for depression. The patient is not nervous/anxious.     Body mass index is 30.82 kg/m.   Past Medical History:   Diagnosis Date  . Allergy   . Arthritis    osteoarthritis   . Chronic kidney disease   . Complication of anesthesia    difficult waking   . Depression   . Diabetes mellitus   . Diverticula, colon   . Family history of anesthesia complication    Son is difficult intubation  . Hyperlipidemia   . Hypertension   . Vasovagal syncope 2006   Negative cardiac workup-myoview, echo    reports that she quit smoking about 20 years ago. Her smoking use included cigarettes. She has a 45.00 pack-year smoking history. She has never used smokeless tobacco. She reports that she does not drink alcohol or use drugs.   Current Outpatient Medications:  .  albuterol (PROVENTIL HFA;VENTOLIN HFA) 108 (90 Base) MCG/ACT inhaler, Inhale 2 puffs into the lungs every 4 (four) hours as needed for wheezing or shortness of breath., Disp: 1 Inhaler, Rfl: 1 .  escitalopram (LEXAPRO) 20 MG tablet, TAKE 1 TABLET BY MOUTH EVERY DAY, Disp: 90 tablet, Rfl: 1 .  midodrine (PROAMATINE) 5 MG tablet, Take 5 mg by mouth daily., Disp: , Rfl:  .  Multiple Vitamins-Minerals (MULTIVITAMIN WITH MINERALS) tablet, Take 1 tablet by mouth daily., Disp: , Rfl:  .  SPIRIVA HANDIHALER 18 MCG inhalation capsule, INHALE 1 CAPSULE VIA HANDIHALER ONCE DAILY AT THE SAME TIME EVERY DAY, Disp: 30 capsule, Rfl: 5 .  Vitamin D, Ergocalciferol, (DRISDOL) 1.25 MG (50000 UNIT) CAPS capsule, TAKE 1 CAPSULE (50,000 UNITS TOTAL) BY MOUTH EVERY FRIDAY., Disp: 12 capsule, Rfl: 3   Observations/Objective: Blood pressure (!) 142/78, pulse 78, temperature 98.8 F (37.1 C), temperature source Temporal, height 5'  2" (1.575 m), weight 168 lb 8 oz (76.4 kg), SpO2 93 %.  Physical Exam Constitutional:      General: She is not in acute distress.    Appearance: Normal appearance. She is well-developed. She is obese. She is not ill-appearing or toxic-appearing.     Comments: Elderly in NAD.  HENT:     Head: Normocephalic.     Right Ear: Hearing, tympanic  membrane, ear canal and external ear normal. Tympanic membrane is not erythematous, retracted or bulging.     Left Ear: Hearing, tympanic membrane, ear canal and external ear normal. Tympanic membrane is not erythematous, retracted or bulging.     Nose: No mucosal edema or rhinorrhea.     Right Sinus: No maxillary sinus tenderness or frontal sinus tenderness.     Left Sinus: No maxillary sinus tenderness or frontal sinus tenderness.     Mouth/Throat:     Pharynx: Uvula midline.  Eyes:     General: Lids are normal. Lids are everted, no foreign bodies appreciated.     Conjunctiva/sclera: Conjunctivae normal.     Pupils: Pupils are equal, round, and reactive to light.  Neck:     Thyroid: No thyroid mass or thyromegaly.     Vascular: No carotid bruit.     Trachea: Trachea normal.  Cardiovascular:     Rate and Rhythm: Normal rate and regular rhythm.     Pulses: Normal pulses.     Heart sounds: Normal heart sounds, S1 normal and S2 normal. No murmur. No friction rub. No gallop.   Pulmonary:     Effort: Pulmonary effort is normal. No tachypnea or respiratory distress.     Breath sounds: Normal breath sounds. No decreased breath sounds, wheezing, rhonchi or rales.  Abdominal:     General: Bowel sounds are normal.     Palpations: Abdomen is soft.     Tenderness: There is no abdominal tenderness.  Musculoskeletal:     Cervical back: Normal range of motion and neck supple.  Skin:    General: Skin is warm and dry.     Findings: No rash.  Neurological:     Mental Status: She is alert.  Psychiatric:        Mood and Affect: Mood is not anxious or depressed.        Speech: Speech normal.        Behavior: Behavior normal. Behavior is cooperative.        Thought Content: Thought content normal.        Judgment: Judgment normal.      Assessment and Plan   Type 2 diabetes mellitus with stage 3 chronic kidney disease ([place]) Excellent control of diabtets and stable renal function.  Type 2  diabetes mellitus with diabetic neuropathy, unspecified (HCC) Tolerable control of neuropathy.  Neuropathy due to type 2 diabetes mellitus (HCC) Due to DM. No change.  HYPERTENSION, BENIGN SYSTEMIC Well controlled. Continue current medication.      Eliezer Lofts, MD

## 2019-12-11 ENCOUNTER — Encounter: Payer: Self-pay | Admitting: Family Medicine

## 2019-12-11 NOTE — Assessment & Plan Note (Signed)
Well controlled. Continue current medication.  

## 2019-12-11 NOTE — Assessment & Plan Note (Signed)
Tolerable control of neuropathy.

## 2019-12-11 NOTE — Assessment & Plan Note (Addendum)
Due to DM. No change.

## 2019-12-11 NOTE — Assessment & Plan Note (Signed)
Excellent control of diabtets and stable renal function.

## 2020-01-15 ENCOUNTER — Other Ambulatory Visit: Payer: Self-pay | Admitting: Family Medicine

## 2020-03-25 DIAGNOSIS — H35371 Puckering of macula, right eye: Secondary | ICD-10-CM | POA: Diagnosis not present

## 2020-03-25 DIAGNOSIS — H353132 Nonexudative age-related macular degeneration, bilateral, intermediate dry stage: Secondary | ICD-10-CM | POA: Diagnosis not present

## 2020-03-25 DIAGNOSIS — Z961 Presence of intraocular lens: Secondary | ICD-10-CM | POA: Diagnosis not present

## 2020-03-25 LAB — HM DIABETES EYE EXAM

## 2020-04-09 ENCOUNTER — Encounter: Payer: Self-pay | Admitting: Family Medicine

## 2020-04-09 ENCOUNTER — Ambulatory Visit: Payer: Medicare PPO | Admitting: Family Medicine

## 2020-04-09 ENCOUNTER — Other Ambulatory Visit: Payer: Self-pay

## 2020-04-09 VITALS — BP 116/60 | HR 64 | Temp 97.7°F | Ht 62.0 in | Wt 160.8 lb

## 2020-04-09 DIAGNOSIS — M79672 Pain in left foot: Secondary | ICD-10-CM | POA: Diagnosis not present

## 2020-04-09 LAB — CBC WITH DIFFERENTIAL/PLATELET
Basophils Absolute: 0 10*3/uL (ref 0.0–0.1)
Basophils Relative: 0.3 % (ref 0.0–3.0)
Eosinophils Absolute: 0.1 10*3/uL (ref 0.0–0.7)
Eosinophils Relative: 1 % (ref 0.0–5.0)
HCT: 37.6 % (ref 36.0–46.0)
Hemoglobin: 12.4 g/dL (ref 12.0–15.0)
Lymphocytes Relative: 36.4 % (ref 12.0–46.0)
Lymphs Abs: 4 10*3/uL (ref 0.7–4.0)
MCHC: 33 g/dL (ref 30.0–36.0)
MCV: 93.3 fl (ref 78.0–100.0)
Monocytes Absolute: 1.5 10*3/uL — ABNORMAL HIGH (ref 0.1–1.0)
Monocytes Relative: 13.4 % — ABNORMAL HIGH (ref 3.0–12.0)
Neutro Abs: 5.3 10*3/uL (ref 1.4–7.7)
Neutrophils Relative %: 48.9 % (ref 43.0–77.0)
Platelets: 231 10*3/uL (ref 150.0–400.0)
RBC: 4.03 Mil/uL (ref 3.87–5.11)
RDW: 13.4 % (ref 11.5–15.5)
WBC: 10.9 10*3/uL — ABNORMAL HIGH (ref 4.0–10.5)

## 2020-04-09 LAB — BASIC METABOLIC PANEL
BUN: 35 mg/dL — ABNORMAL HIGH (ref 6–23)
CO2: 28 mEq/L (ref 19–32)
Calcium: 9.4 mg/dL (ref 8.4–10.5)
Chloride: 101 mEq/L (ref 96–112)
Creatinine, Ser: 1.9 mg/dL — ABNORMAL HIGH (ref 0.40–1.20)
GFR: 25.05 mL/min — ABNORMAL LOW (ref >60.00)
Glucose, Bld: 213 mg/dL — ABNORMAL HIGH (ref 70–99)
Potassium: 4.1 mEq/L (ref 3.5–5.1)
Sodium: 137 mEq/L (ref 135–145)

## 2020-04-09 LAB — URIC ACID: Uric Acid, Serum: 10.9 mg/dL — ABNORMAL HIGH (ref 2.4–7.0)

## 2020-04-09 MED ORDER — PREDNISONE 20 MG PO TABS: ORAL_TABLET | ORAL | 0 refills | Status: DC

## 2020-04-09 NOTE — Assessment & Plan Note (Signed)
Likely gout. Check uric acid and renal function.. restart low purine diet. Start prednison taper, ice and elevation. May need X-ray if not improving .  Doubt infeciton.. eval cbc.

## 2020-04-09 NOTE — Progress Notes (Signed)
Chief Complaint  Patient presents with  . Foot Pain    left foot pain, no injury , swelling, pain weight baring, not taking anything, resting for pain     History of Present Illness: HPI   86 year old female presents with 3 days of sudden onset left foot.. dorsal foot and in great toe.  Area is red and swollen. Pain is 7/10 on pain scale.  No change in activity, no fall, no known injury.   She has elevated it some, not taking any meds to treat it.   No recent changes in medicine.  Has history of renal issues and gout.    This visit occurred during the SARS-CoV-2 public health emergency.  Safety protocols were in place, including screening questions prior to the visit, additional usage of staff PPE, and extensive cleaning of exam room while observing appropriate contact time as indicated for disinfecting solutions.   COVID 19 screen:  No recent travel or known exposure to COVID19 The patient denies respiratory symptoms of COVID 19 at this time. The importance of social distancing was discussed today.     Review of Systems  Constitutional: Negative for chills and fever.  HENT: Negative for congestion and ear pain.   Eyes: Negative for pain and redness.  Respiratory: Negative for cough and shortness of breath.   Cardiovascular: Negative for chest pain, palpitations and leg swelling.  Gastrointestinal: Negative for abdominal pain, blood in stool, constipation, diarrhea, nausea and vomiting.  Genitourinary: Negative for dysuria.  Musculoskeletal: Negative for falls and myalgias.  Skin: Negative for rash.  Neurological: Negative for dizziness.  Psychiatric/Behavioral: Negative for depression. The patient is not nervous/anxious.       Past Medical History:  Diagnosis Date  . Allergy   . Arthritis    osteoarthritis   . Chronic kidney disease   . Complication of anesthesia    difficult waking   . Depression   . Diabetes mellitus   . Diverticula, colon   . Family  history of anesthesia complication    Son is difficult intubation  . Hyperlipidemia   . Hypertension   . Vasovagal syncope 2006   Negative cardiac workup-myoview, echo    reports that she quit smoking about 20 years ago. Her smoking use included cigarettes. She has a 45.00 pack-year smoking history. She has never used smokeless tobacco. She reports that she does not drink alcohol and does not use drugs.   Current Outpatient Medications:  .  albuterol (PROVENTIL HFA;VENTOLIN HFA) 108 (90 Base) MCG/ACT inhaler, Inhale 2 puffs into the lungs every 4 (four) hours as needed for wheezing or shortness of breath., Disp: 1 Inhaler, Rfl: 1 .  escitalopram (LEXAPRO) 20 MG tablet, TAKE 1 TABLET BY MOUTH EVERY DAY, Disp: 90 tablet, Rfl: 1 .  midodrine (PROAMATINE) 5 MG tablet, Take 5 mg by mouth daily., Disp: , Rfl:  .  Multiple Vitamins-Minerals (MULTIVITAMIN WITH MINERALS) tablet, Take 1 tablet by mouth daily., Disp: , Rfl:  .  SPIRIVA HANDIHALER 18 MCG inhalation capsule, INHALE 1 CAPSULE VIA HANDIHALER ONCE DAILY AT THE SAME TIME EVERY DAY, Disp: 30 capsule, Rfl: 5 .  Vitamin D, Ergocalciferol, (DRISDOL) 1.25 MG (50000 UNIT) CAPS capsule, TAKE 1 CAPSULE (50,000 UNITS TOTAL) BY MOUTH EVERY FRIDAY., Disp: 12 capsule, Rfl: 3   Observations/Objective: Blood pressure 116/60, pulse 64, temperature 97.7 F (36.5 C), temperature source Temporal, height 5\' 2"  (1.575 m), weight 160 lb 12.8 oz (72.9 kg), SpO2 94 %.  Physical Exam Constitutional:  General: She is not in acute distress.    Appearance: Normal appearance. She is well-developed. She is not ill-appearing or toxic-appearing.  HENT:     Head: Normocephalic.     Right Ear: Hearing, tympanic membrane, ear canal and external ear normal. Tympanic membrane is not erythematous, retracted or bulging.     Left Ear: Hearing, tympanic membrane, ear canal and external ear normal. Tympanic membrane is not erythematous, retracted or bulging.     Nose: No  mucosal edema or rhinorrhea.     Right Sinus: No maxillary sinus tenderness or frontal sinus tenderness.     Left Sinus: No maxillary sinus tenderness or frontal sinus tenderness.     Mouth/Throat:     Pharynx: Uvula midline.  Eyes:     General: Lids are normal. Lids are everted, no foreign bodies appreciated.     Conjunctiva/sclera: Conjunctivae normal.     Pupils: Pupils are equal, round, and reactive to light.  Neck:     Thyroid: No thyroid mass or thyromegaly.     Vascular: No carotid bruit.     Trachea: Trachea normal.  Cardiovascular:     Rate and Rhythm: Normal rate and regular rhythm.     Pulses:          Dorsalis pedis pulses are 1+ on the right side and 1+ on the left side.       Posterior tibial pulses are 1+ on the right side and 1+ on the left side.     Heart sounds: Normal heart sounds, S1 normal and S2 normal. No murmur heard.  No friction rub. No gallop.   Pulmonary:     Effort: Pulmonary effort is normal. No tachypnea or respiratory distress.     Breath sounds: Normal breath sounds. No decreased breath sounds, wheezing, rhonchi or rales.  Abdominal:     General: Bowel sounds are normal.     Palpations: Abdomen is soft.     Tenderness: There is no abdominal tenderness.  Musculoskeletal:     Cervical back: Normal range of motion and neck supple.  Feet:     Right foot:     Skin integrity: Skin integrity normal.     Left foot:     Skin integrity: Erythema and warmth present. No callus, dry skin or fissure.     Comments:  ttp over left dorsal foot, nonfocally Skin:    General: Skin is warm and dry.     Findings: No rash.  Neurological:     Mental Status: She is alert.  Psychiatric:        Mood and Affect: Mood is not anxious or depressed.        Speech: Speech normal.        Behavior: Behavior normal. Behavior is cooperative.        Thought Content: Thought content normal.        Judgment: Judgment normal.      Assessment and Plan        Eliezer Lofts, MD

## 2020-04-09 NOTE — Patient Instructions (Addendum)
Can ice foot, elevate foot. Complete a course of prednisone. Avoid high purine foods.   Please stop at the lab to have labs drawn.  Call if not improving after steroid course.   Low-Purine Eating Plan A low-purine eating plan involves making food choices to limit your intake of purine. Purine is a kind of uric acid. Too much uric acid in your blood can cause certain conditions, such as gout and kidney stones. Eating a low-purine diet can help control these conditions. What are tips for following this plan? Reading food labels   Avoid foods with saturated or Trans fat.  Check the ingredient list of grains-based foods, such as bread and cereal, to make sure that they contain whole grains.  Check the ingredient list of sauces or soups to make sure they do not contain meat or fish.  When choosing soft drinks, check the ingredient list to make sure they do not contain high-fructose corn syrup. Shopping  Buy plenty of fresh fruits and vegetables.  Avoid buying canned or fresh fish.  Buy dairy products labeled as low-fat or nonfat.  Avoid buying premade or processed foods. These foods are often high in fat, salt (sodium), and added sugar. Cooking  Use olive oil instead of butter when cooking. Oils like olive oil, canola oil, and sunflower oil contain healthy fats. Meal planning  Learn which foods do or do not affect you. If you find out that a food tends to cause your gout symptoms to flare up, avoid eating that food. You can enjoy foods that do not cause problems. If you have any questions about a food item, talk with your dietitian or health care provider.  Limit foods high in fat, especially saturated fat. Fat makes it harder for your body to get rid of uric acid.  Choose foods that are lower in fat and are lean sources of protein. General guidelines  Limit alcohol intake to no more than 1 drink a day for nonpregnant women and 2 drinks a day for men. One drink equals 12 oz of  beer, 5 oz of wine, or 1 oz of hard liquor. Alcohol can affect the way your body gets rid of uric acid.  Drink plenty of water to keep your urine clear or pale yellow. Fluids can help remove uric acid from your body.  If directed by your health care provider, take a vitamin C supplement.  Work with your health care provider and dietitian to develop a plan to achieve or maintain a healthy weight. Losing weight can help reduce uric acid in your blood. What foods are recommended? The items listed may not be a complete list. Talk with your dietitian about what dietary choices are best for you. Foods low in purines Foods low in purines do not need to be limited. These include:  All fruits.  All low-purine vegetables, pickles, and olives.  Breads, pasta, rice, cornbread, and popcorn. Cake and other baked goods.  All dairy foods.  Eggs, nuts, and nut butters.  Spices and condiments, such as salt, herbs, and vinegar.  Plant oils, butter, and margarine.  Water, sugar-free soft drinks, tea, coffee, and cocoa.  Vegetable-based soups, broths, sauces, and gravies. Foods moderate in purines Foods moderate in purines should be limited to the amounts listed.   cup of asparagus, cauliflower, spinach, mushrooms, or green peas, each day.  2/3 cup uncooked oatmeal, each day.   cup dry wheat bran or wheat germ, each day.  2-3 ounces of meat or poultry,  each day.  4-6 ounces of shellfish, such as crab, lobster, oysters, or shrimp, each day.  1 cup cooked beans, peas, or lentils, each day.  Soup, broths, or bouillon made from meat or fish. Limit these foods as much as possible. What foods are not recommended? The items listed may not be a complete list. Talk with your dietitian about what dietary choices are best for you. Limit your intake of foods high in purines, including:  Beer and other alcohol.  Meat-based gravy or sauce.  Canned or fresh fish, such as: ? Anchovies, sardines,  herring, and tuna. ? Mussels and scallops. ? Codfish, trout, and haddock.  Berniece Salines.  Organ meats, such as: ? Liver or kidney. ? Tripe. ? Sweetbreads (thymus gland or pancreas).  Wild Clinical biochemist.  Yeast or yeast extract supplements.  Drinks sweetened with high-fructose corn syrup. Summary  Eating a low-purine diet can help control conditions caused by too much uric acid in the body, such as gout or kidney stones.  Choose low-purine foods, limit alcohol, and limit foods high in fat.  You will learn over time which foods do or do not affect you. If you find out that a food tends to cause your gout symptoms to flare up, avoid eating that food. This information is not intended to replace advice given to you by your health care provider. Make sure you discuss any questions you have with your health care provider. Document Revised: 07/21/2017 Document Reviewed: 09/21/2016 Elsevier Patient Education  2020 [employer].

## 2020-04-28 ENCOUNTER — Other Ambulatory Visit: Payer: Self-pay | Admitting: Family Medicine

## 2020-04-28 MED ORDER — PREDNISONE 20 MG PO TABS: ORAL_TABLET | ORAL | 0 refills | Status: DC

## 2020-06-01 ENCOUNTER — Ambulatory Visit: Payer: Medicare PPO | Admitting: Family Medicine

## 2020-06-03 ENCOUNTER — Other Ambulatory Visit: Payer: Self-pay

## 2020-06-03 ENCOUNTER — Ambulatory Visit (INDEPENDENT_AMBULATORY_CARE_PROVIDER_SITE_OTHER): Payer: Medicare PPO

## 2020-06-03 DIAGNOSIS — Z23 Encounter for immunization: Secondary | ICD-10-CM

## 2020-06-28 ENCOUNTER — Ambulatory Visit
Admission: RE | Admit: 2020-06-28 | Discharge: 2020-06-28 | Disposition: A | Discharge status: Home / Self Care | Payer: Medicare PPO | Source: Ambulatory Visit | Attending: Physician Assistant | Admitting: Physician Assistant

## 2020-06-28 ENCOUNTER — Ambulatory Visit (INDEPENDENT_AMBULATORY_CARE_PROVIDER_SITE_OTHER): Payer: Medicare PPO

## 2020-06-28 ENCOUNTER — Other Ambulatory Visit: Payer: Self-pay

## 2020-06-28 VITALS — BP 151/77 | HR 77 | Resp 16

## 2020-06-28 DIAGNOSIS — S40011A Contusion of right shoulder, initial encounter: Secondary | ICD-10-CM

## 2020-06-28 DIAGNOSIS — M25511 Pain in right shoulder: Secondary | ICD-10-CM | POA: Diagnosis not present

## 2020-06-28 DIAGNOSIS — W19XXXA Unspecified fall, initial encounter: Secondary | ICD-10-CM | POA: Diagnosis not present

## 2020-06-28 DIAGNOSIS — S40021A Contusion of right upper arm, initial encounter: Secondary | ICD-10-CM | POA: Diagnosis not present

## 2020-06-28 DIAGNOSIS — S0990XA Unspecified injury of head, initial encounter: Secondary | ICD-10-CM

## 2020-06-28 NOTE — Discharge Instructions (Addendum)
Xray without any obvious signs of fracture, dislocation. We will call you have radiologist read is different. As discussed, possible nasal fracture, ice compress and monitor. Ibuprofen/tylenol as needed for pain. If having sudden headache, vomiting, confusion, losing balance, vision changes, go to the emergency department for further evaluation. Otherwise follow up with PCP next week for recheck and monitoring.

## 2020-06-28 NOTE — ED Provider Notes (Signed)
[facility]    CSN: [id number] Arrival date & time: 06/28/20  1441      History   Chief Complaint No chief complaint on file.   HPI Jill [name] is a 86 y.o. female.   86 year old female comes in with family member for evaluation after fall 3 days ago.  She lost her balance, stating she tripped on her slippers, falling forward.  States she hit her face, and right side of her body.  She denies loss of consciousness.  Has had bruising to the face, and complains of right-sided pain, neck pain.  Has still been able to ambulate on own at baseline with a cane.  Denies headache, nausea, vomiting.  Denies vision changes.  Family member states gait is normal with a cane.  Has not had any obvious confusions, behavioral changes.     Past Medical History:  Diagnosis Date  . Allergy   . Arthritis    osteoarthritis   . Chronic kidney disease   . Complication of anesthesia    difficult waking   . Depression   . Diabetes mellitus   . Diverticula, colon   . Family history of anesthesia complication    Son is difficult intubation  . Hyperlipidemia   . Hypertension   . Vasovagal syncope 2006   Negative cardiac workup-myoview, echo    Patient Active Problem List   Diagnosis Date Noted  . Left foot pain 04/09/2020  . Acute infectious diarrhea 05/21/2019  . Bilateral leg weakness 05/21/2019  . Pulmonary nodule 05/29/2018  . Urinary urgency 02/03/2017  . Bilateral buttock pain 02/02/2017  . Gout involving toe of right foot 12/23/2016  . [place] intertrigo 10/28/2016  . Stage 2 moderate COPD by GOLD classification ([place]) 09/13/2016  . Type 2 diabetes mellitus with stage 3 chronic kidney disease ([place]) 08/08/2016  . Chronic diastolic congestive heart failure ([place]) 08/08/2016  . Intracranial bleed ([place])   . BPPV (benign paroxysmal positional vertigo) 06/24/2016  . Memory loss 06/24/2016  . Accidental fall 06/24/2016  . Chronic neck pain 03/18/2016  . Neuropathy due to  type 2 diabetes mellitus ([place]) 03/18/2016  . Anemia in chronic kidney disease 12/15/2015  . Incomplete bladder emptying 10/15/2015  . Abscess, renal/perirenal 10/15/2015  . Recurrent UTI 10/15/2015  . Lumbar back pain with radiculopathy affecting left lower extremity 05/05/2015  . Renal abscess 01/28/2015  . Chronic fatigue 01/22/2015  . Chronic idiopathic urticaria 01/17/2015  . S/P hip replacement 05/16/2014  . DNR (do not resuscitate) 02/20/2014  . ALLERGIC RHINITIS 12/17/2008  . Vitamin D deficiency 07/01/2008  . CKD (chronic kidney disease) stage 4, GFR 15-29 ml/min (H[facility]) 02/11/2008  . DIVERTICULOSIS OF COLON 02/01/2008  . COLONIC POLYPS, ADENOMATOUS 10/19/2006  . Type 2 diabetes mellitus with diabetic neuropathy, unspecified ([place]) 10/19/2006  . Hyperlipidemia 10/19/2006  . OBESITY, NOS 10/19/2006  . Major depression, recurrent ([place]) 10/19/2006  . HYPERTENSION, BENIGN SYSTEMIC 10/19/2006  . Recurrent urticaria 10/19/2006    Past Surgical History:  Procedure Laterality Date  . ABDOMINAL HYSTERECTOMY  1977   fibroid  . JOINT REPLACEMENT  2009   rt hip  . TOTAL HIP ARTHROPLASTY Left 05/12/2014   dr Mayer Camel  . TOTAL HIP ARTHROPLASTY Left 05/12/2014   Procedure: TOTAL HIP ARTHROPLASTY;  Surgeon: Kerin Salen, MD;  Location: [facility];  Service: Orthopedics;  Laterality: Left;    OB History   No obstetric history on file.      Home Medications    Prior to Admission  medications   Medication Sig Start Date End Date Taking? Authorizing Provider  albuterol (PROVENTIL HFA;VENTOLIN HFA) 108 (90 Base) MCG/ACT inhaler Inhale 2 puffs into the lungs every 4 (four) hours as needed for wheezing or shortness of breath. 05/28/18   Tonia Ghent, MD  escitalopram (LEXAPRO) 20 MG tablet TAKE 1 TABLET BY MOUTH EVERY DAY 01/15/20   Bedsole, Aldahir Litaker E, MD  midodrine (PROAMATINE) 5 MG tablet Take 5 mg by mouth daily. 04/18/19   [provider]  Multiple Vitamins-Minerals (MULTIVITAMIN WITH  MINERALS) tablet Take 1 tablet by mouth daily.    [provider]  SPIRIVA HANDIHALER 18 MCG inhalation capsule INHALE 1 CAPSULE VIA HANDIHALER ONCE DAILY AT THE SAME TIME EVERY DAY 11/02/18   Bedsole, Daphnee Preiss E, MD  Vitamin D, Ergocalciferol, (DRISDOL) 1.25 MG (50000 UNIT) CAPS capsule TAKE 1 CAPSULE (50,000 UNITS TOTAL) BY MOUTH EVERY FRIDAY. 10/11/19   Jinny Sanders, MD    Family History Family History  Problem Relation Age of Onset  . COPD Brother   . Heart disease Brother   . Diabetes Brother   . Lymphoma Brother   . Alzheimer's disease Brother   . Pancreatic cancer Sister   . Alzheimer's disease Sister   . Heart disease Mother   . Breast cancer Sister   . Leukemia Other   . Kidney disease Neg Hx     Social History Social History   Tobacco Use  . Smoking status: Former Smoker    Packs/day: 1.00    Years: 45.00    Pack years: 45.00    Types: Cigarettes    Quit date: 06/16/1999    Years since quitting: 21.0  . Smokeless tobacco: Never Used  . Tobacco comment: quit 15 years ago  Substance Use Topics  . Alcohol use: No    Alcohol/week: 0.0 standard drinks  . Drug use: No     Allergies   Lovastatin, Statins, Sulfa antibiotics, Codeine, and Niacin   Review of Systems Review of Systems  Reason unable to perform ROS: See HPI as above.     Physical Exam Triage Vital Signs ED Triage Vitals  Enc Vitals Group     BP 06/28/20 1456 (!) 151/77     Pulse Rate 06/28/20 1456 77     Resp 06/28/20 1456 16     Temp --      Temp src --      SpO2 06/28/20 1456 94 %     Weight --      Height --      Head Circumference --      Peak Flow --      Pain Score 06/28/20 1454 0     Pain Loc --      Pain Edu? --      Excl. in [place]? --    No data found.  Updated Vital Signs BP (!) 151/77 (BP Location: Left Arm)   Pulse 77   Resp 16   SpO2 94%   Physical Exam Constitutional:      General: She is not in acute distress.    Appearance: Normal appearance. She is  well-developed. She is not toxic-appearing or diaphoretic.  HENT:     Head: Normocephalic and atraumatic.     Comments: Swelling to the nasal bridge with contusion. Contusion to bilateral lower eyelid/maxillary area. Tenderness to palpation of the nose. Bilateral nares patent. No tenderness to orbital bone Eyes:     Extraocular Movements: Extraocular movements intact.  Conjunctiva/sclera: Conjunctivae normal.     Pupils: Pupils are equal, round, and reactive to light.  Neck:     Comments: No tenderness to palpation of spinous processes, bilateral neck. Cardiovascular:     Rate and Rhythm: Normal rate and regular rhythm.  Pulmonary:     Effort: Pulmonary effort is normal. No respiratory distress.     Comments: LCTAB Musculoskeletal:     Cervical back: Normal range of motion and neck supple.     Comments: No tenderness to palpation of spinous processes.  Tenderness to palpation of distal right clavicle, distal humerus. Contusion noted to the right hand without tenderness to palpation.  Full range of motion of BUE. Strength 5/5. Sensation intact.  No tenderness to palpation of lower back, bilateral hips. Full ROM of hips. Able to ambulate on own at baseline  Skin:    General: Skin is warm and dry.  Neurological:     Mental Status: She is alert and oriented to person, place, and time.     Comments: Neurology exam grossly intact without focal deficits. No facial drooping. Strength 5/5 BUE/BLE. Ambulating with cane, gait baseline per patient/family member      UC Treatments / Results  Labs (all labs ordered are listed, but only abnormal results are displayed) Labs Reviewed - No data to display  EKG   Radiology DG Shoulder Right  Result Date: 06/28/2020 CLINICAL DATA:  [place] on Friday, bruising to RIGHT upper extremity EXAM: RIGHT SHOULDER - 2+ VIEW COMPARISON:  None FINDINGS: Osseous demineralization. AC joint alignment normal. Visualized LEFT ribs intact. No acute fracture,  dislocation, or bone destruction. IMPRESSION: No acute osseous abnormalities. Electronically Signed   By: Lavonia Dana M.D.   On: 06/28/2020 16:37   DG Humerus Right  Result Date: 06/28/2020 CLINICAL DATA:  [place] on Friday, bruising to RIGHT upper extremity EXAM: RIGHT HUMERUS - 2+ VIEW COMPARISON:  None FINDINGS: Osseous demineralization. AC joint alignment normal with mild degenerative changes. Glenohumeral and elbow joint alignments normal. No acute fracture, dislocation, or bone destruction. IMPRESSION: No acute osseous abnormalities. Electronically Signed   By: Lavonia Dana M.D.   On: 06/28/2020 16:38    Procedures Procedures (including critical care time)  Medications Ordered in UC Medications - No data to display  Initial Impression / Assessment and Plan / UC Course  I have reviewed the triage vital signs and the nursing notes.  Pertinent labs & imaging results that were available during my care of the patient were reviewed by me and considered in my medical decision making (see chart for details).    X-ray reviewed by me without obvious fracture or for dislocation.  Also patient with head injury, has not had any headaches, nausea, vomiting, vision changes in the past 2 to 3 days.  Has been ambulating on own at baseline.  Neurology exam grossly intact without focal deficits.  Will treat symptomatically and monitor closely.  Strict return precautions given.  Patient and son expresses understanding and agrees to plan.  Radiology read same. Continue plan as above.  Final Clinical Impressions(s) / UC Diagnoses   Final diagnoses:  Acute pain of right shoulder  Fall, initial encounter  Injury of head, initial encounter    ED Prescriptions    None     PDMP not reviewed this encounter.   Ok Edwards, PA-C 06/28/20 1641

## 2020-06-28 NOTE — ED Triage Notes (Signed)
Patient presents to Urgent Care with complaints of falling Friday. She states losing her balance and landing on her face. Pt has generalized bruising to face and right extremity and soreness to neck area. She reports she is here for evaluation.   Denies losing consciousness, n/v, vision changes, or memory loss.

## 2020-07-02 MED ORDER — DONEPEZIL HCL 5 MG PO TABS: 5.0000 mg | ORAL_TABLET | Freq: Every day | ORAL | 0 refills | Status: DC

## 2020-07-03 ENCOUNTER — Other Ambulatory Visit: Payer: Self-pay

## 2020-07-03 ENCOUNTER — Ambulatory Visit: Payer: Medicare PPO | Admitting: Family Medicine

## 2020-07-03 ENCOUNTER — Encounter: Payer: Self-pay | Admitting: Family Medicine

## 2020-07-03 VITALS — BP 118/76 | HR 78 | Temp 97.8°F | Ht 62.0 in | Wt 162.0 lb

## 2020-07-03 DIAGNOSIS — W19XXXA Unspecified fall, initial encounter: Secondary | ICD-10-CM | POA: Diagnosis not present

## 2020-07-03 DIAGNOSIS — M25511 Pain in right shoulder: Secondary | ICD-10-CM | POA: Insufficient documentation

## 2020-07-03 DIAGNOSIS — F0390 Unspecified dementia without behavioral disturbance: Secondary | ICD-10-CM | POA: Diagnosis not present

## 2020-07-03 DIAGNOSIS — F03A0 Unspecified dementia, mild, without behavioral disturbance, psychotic disturbance, mood disturbance, and anxiety: Secondary | ICD-10-CM | POA: Insufficient documentation

## 2020-07-03 NOTE — Assessment & Plan Note (Signed)
No fracture, good  Range of motion. Use tylenol prn. Home PT exercises.

## 2020-07-03 NOTE — Assessment & Plan Note (Signed)
Start Aricept. Follow up in 1-2 months for AMW.. can do MMSE at that time

## 2020-07-03 NOTE — Patient Instructions (Addendum)
Start Aricept 5 mg daily for memory.  Keep shoulder moving, can use tylenol for pain.

## 2020-07-03 NOTE — Assessment & Plan Note (Signed)
No proceeding cause.

## 2020-07-03 NOTE — Progress Notes (Signed)
Chief Complaint  Patient presents with  . Fall    Here with daughter, Butch Penny. Fell 11-5. Went to ER. Xrays were done of Rt shoulder and arm. No scans of head.     History of Present Illness: HPI  86 year old female presents with her daughter following decline of memory and recent fall.   Fall occurred on 06/26/2020, no proceeding symptoms, lost balance, tripped on slippers and fell forward. Hit face and right side of body.  No proceeding symptoms.. no CP, no lightededness.  Went to ER  Neg X-ray of shoulder/hmerus.  Note reviewed in detail.   Today she reports shoulder pain is improve. No face pain, no headache, no vision change. No new numbness or weakness. Has been feeling tired. Not needing pain med.  Memory loss: gradually worsening in last few years, more so in last few months.  Word finding trouble.  Forgetting some of what happen during the day.  neg lab eval in past. Interested in med to treat.   Lives by her self.  She does ADLs on her own.  Granddaughter moving in for a while herself.  No cooking, no driving.  This visit occurred during the SARS-CoV-2 public health emergency.  Safety protocols were in place, including screening questions prior to the visit, additional usage of staff PPE, and extensive cleaning of exam room while observing appropriate contact time as indicated for disinfecting solutions.   COVID 19 screen:  No recent travel or known exposure to COVID19 The patient denies respiratory symptoms of COVID 19 at this time. The importance of social distancing was discussed today.     ROS    Past Medical History:  Diagnosis Date  . Allergy   . Arthritis    osteoarthritis   . Chronic kidney disease   . Complication of anesthesia    difficult waking   . Depression   . Diabetes mellitus   . Diverticula, colon   . Family history of anesthesia complication    Son is difficult intubation  . Hyperlipidemia   . Hypertension   . Vasovagal syncope 2006    Negative cardiac workup-myoview, echo    reports that she quit smoking about 21 years ago. Her smoking use included cigarettes. She has a 45.00 pack-year smoking history. She has never used smokeless tobacco. She reports that she does not drink alcohol and does not use drugs.   Current Outpatient Medications:  .  albuterol (PROVENTIL HFA;VENTOLIN HFA) 108 (90 Base) MCG/ACT inhaler, Inhale 2 puffs into the lungs every 4 (four) hours as needed for wheezing or shortness of breath., Disp: 1 Inhaler, Rfl: 1 .  donepezil (ARICEPT) 5 MG tablet, Take 1 tablet (5 mg total) by mouth at bedtime., Disp: 30 tablet, Rfl: 0 .  escitalopram (LEXAPRO) 20 MG tablet, TAKE 1 TABLET BY MOUTH EVERY DAY, Disp: 90 tablet, Rfl: 1 .  midodrine (PROAMATINE) 5 MG tablet, Take 5 mg by mouth daily., Disp: , Rfl:  .  Multiple Vitamins-Minerals (MULTIVITAMIN WITH MINERALS) tablet, Take 1 tablet by mouth daily., Disp: , Rfl:  .  SPIRIVA HANDIHALER 18 MCG inhalation capsule, INHALE 1 CAPSULE VIA HANDIHALER ONCE DAILY AT THE SAME TIME EVERY DAY, Disp: 30 capsule, Rfl: 5 .  Vitamin D, Ergocalciferol, (DRISDOL) 1.25 MG (50000 UNIT) CAPS capsule, TAKE 1 CAPSULE (50,000 UNITS TOTAL) BY MOUTH EVERY FRIDAY., Disp: 12 capsule, Rfl: 3   Observations/Objective: Blood pressure 118/76, pulse 78, temperature 97.8 F (36.6 C), height 5\' 2"  (1.575 m), weight 162 lb (  73.5 kg), SpO2 97 %.  Physical Exam Constitutional:      Comments: Elderly female in NAD  HENT:     Head:     Comments: Bump and ttp over nasal bridge Musculoskeletal:     Right shoulder: Tenderness present. No bony tenderness or crepitus. Normal range of motion. Normal strength. Normal pulse.  Neurological:     General: No focal deficit present.     Mental Status: She is oriented to person, place, and time.     Cranial Nerves: Cranial nerves are intact.     Sensory: Sensation is intact.     Motor: Motor function is intact.     Coordination: Coordination abnormal.   Psychiatric:        Mood and Affect: Mood normal.        Speech: Speech normal.        Behavior: Behavior normal.        Cognition and Memory: Cognition is not impaired. Memory is impaired. She exhibits impaired recent memory and impaired remote memory.        Judgment: Judgment normal.      Assessment and Plan Mild dementia ([place])  Start Aricept. Follow up in 1-2 months for AMW.. can do MMSE at that time  Acute pain of right shoulder No fracture, good  Range of motion. Use tylenol prn. Home PT exercises.  Accidental fall No proceeding cause.    Eliezer Lofts, MD

## 2020-07-25 ENCOUNTER — Other Ambulatory Visit: Payer: Self-pay | Admitting: Family Medicine

## 2020-09-12 ENCOUNTER — Ambulatory Visit (HOSPITAL_COMMUNITY)
Admission: EM | Admit: 2020-09-12 | Discharge: 2020-09-12 | Disposition: A | Discharge status: Home / Self Care | Payer: Medicare PPO | Attending: Urgent Care | Admitting: Urgent Care

## 2020-09-12 ENCOUNTER — Other Ambulatory Visit: Payer: Self-pay

## 2020-09-12 ENCOUNTER — Encounter (HOSPITAL_COMMUNITY): Payer: Self-pay | Admitting: Urgent Care

## 2020-09-12 ENCOUNTER — Ambulatory Visit (INDEPENDENT_AMBULATORY_CARE_PROVIDER_SITE_OTHER): Payer: Medicare PPO

## 2020-09-12 DIAGNOSIS — N3001 Acute cystitis with hematuria: Secondary | ICD-10-CM | POA: Insufficient documentation

## 2020-09-12 DIAGNOSIS — R059 Cough, unspecified: Secondary | ICD-10-CM | POA: Insufficient documentation

## 2020-09-12 DIAGNOSIS — U071 COVID-19: Secondary | ICD-10-CM | POA: Diagnosis not present

## 2020-09-12 DIAGNOSIS — R0981 Nasal congestion: Secondary | ICD-10-CM | POA: Insufficient documentation

## 2020-09-12 DIAGNOSIS — J449 Chronic obstructive pulmonary disease, unspecified: Secondary | ICD-10-CM | POA: Diagnosis not present

## 2020-09-12 DIAGNOSIS — R0602 Shortness of breath: Secondary | ICD-10-CM | POA: Diagnosis not present

## 2020-09-12 DIAGNOSIS — Z87891 Personal history of nicotine dependence: Secondary | ICD-10-CM | POA: Diagnosis not present

## 2020-09-12 DIAGNOSIS — R0989 Other specified symptoms and signs involving the circulatory and respiratory systems: Secondary | ICD-10-CM

## 2020-09-12 LAB — POCT URINALYSIS DIPSTICK, ED / UC
Bilirubin Urine: NEGATIVE
Glucose, UA: NEGATIVE mg/dL
Ketones, ur: NEGATIVE mg/dL
Nitrite: POSITIVE — AB
Protein, ur: 100 mg/dL — AB
Specific Gravity, Urine: 1.02 (ref 1.005–1.030)
Urobilinogen, UA: 0.2 mg/dL (ref 0.0–1.0)
pH: 5.5 (ref 5.0–8.0)

## 2020-09-12 LAB — SARS CORONAVIRUS 2 (TAT 6-24 HRS): SARS Coronavirus 2: POSITIVE — AB

## 2020-09-12 MED ORDER — CETIRIZINE HCL 5 MG PO TABS: 5.0000 mg | ORAL_TABLET | Freq: Every day | ORAL | 0 refills | Status: DC

## 2020-09-12 MED ORDER — CEPHALEXIN 500 MG PO CAPS: 500.0000 mg | ORAL_CAPSULE | Freq: Two times a day (BID) | ORAL | 0 refills | Status: DC

## 2020-09-12 NOTE — ED Provider Notes (Signed)
[place]   MRN: [id number] DOB: [birth year]  Subjective:   Jill [name] is a 87 y.o. female presenting for 1 week history of urinary frequency, malodorous urine. Also started having a cough, chest congestion, shob since last night. Has had nasal congestion. Has a history of CKD, last GFR was 25 in August 2021. Has a history of COPD, takes Spiriva for this. She no longer smokes. Has had COVID vaccination and booster.   No current facility-administered medications for this encounter.  Current Outpatient Medications:  .  albuterol (PROVENTIL HFA;VENTOLIN HFA) 108 (90 Base) MCG/ACT inhaler, Inhale 2 puffs into the lungs every 4 (four) hours as needed for wheezing or shortness of breath., Disp: 1 Inhaler, Rfl: 1 .  donepezil (ARICEPT) 5 MG tablet, TAKE 1 TABLET BY MOUTH EVERYDAY AT BEDTIME, Disp: 30 tablet, Rfl: 2 .  escitalopram (LEXAPRO) 20 MG tablet, TAKE 1 TABLET BY MOUTH EVERY DAY, Disp: 90 tablet, Rfl: 1 .  midodrine (PROAMATINE) 5 MG tablet, Take 5 mg by mouth daily., Disp: , Rfl:  .  Multiple Vitamins-Minerals (MULTIVITAMIN WITH MINERALS) tablet, Take 1 tablet by mouth daily., Disp: , Rfl:  .  SPIRIVA HANDIHALER 18 MCG inhalation capsule, INHALE 1 CAPSULE VIA HANDIHALER ONCE DAILY AT THE SAME TIME EVERY DAY, Disp: 30 capsule, Rfl: 5 .  Vitamin D, Ergocalciferol, (DRISDOL) 1.25 MG (50000 UNIT) CAPS capsule, TAKE 1 CAPSULE (50,000 UNITS TOTAL) BY MOUTH EVERY FRIDAY., Disp: 12 capsule, Rfl: 3   Allergies  Allergen Reactions  . Lovastatin Other (See Comments)    REACTION: leg pain  . Statins Other (See Comments)    REACTION: leg cramps, weakness  . Sulfa Antibiotics Hives and Itching  . Codeine Rash  . Niacin Rash    Flushing also    Past Medical History:  Diagnosis Date  . Allergy   . Arthritis    osteoarthritis   . Chronic kidney disease   . Complication of anesthesia    difficult waking   . Depression   . Diabetes mellitus   . Diverticula, colon    . Family history of anesthesia complication    Son is difficult intubation  . Hyperlipidemia   . Hypertension   . Vasovagal syncope 2006   Negative cardiac workup-myoview, echo     Past Surgical History:  Procedure Laterality Date  . ABDOMINAL HYSTERECTOMY  1977   fibroid  . JOINT REPLACEMENT  2009   rt hip  . TOTAL HIP ARTHROPLASTY Left 05/12/2014   dr Mayer Camel  . TOTAL HIP ARTHROPLASTY Left 05/12/2014   Procedure: TOTAL HIP ARTHROPLASTY;  Surgeon: Kerin Salen, MD;  Location: [facility];  Service: Orthopedics;  Laterality: Left;    Family History  Problem Relation Age of Onset  . COPD Brother   . Heart disease Brother   . Diabetes Brother   . Lymphoma Brother   . Alzheimer's disease Brother   . Pancreatic cancer Sister   . Alzheimer's disease Sister   . Heart disease Mother   . Breast cancer Sister   . Leukemia Other   . Kidney disease Neg Hx     Social History   Tobacco Use  . Smoking status: Former Smoker    Packs/day: 1.00    Years: 45.00    Pack years: 45.00    Types: Cigarettes    Quit date: 06/16/1999    Years since quitting: 21.2  . Smokeless tobacco: Never Used  . Tobacco comment: quit 15 years ago  Substance Use Topics  . Alcohol use: No    Alcohol/week: 0.0 standard drinks  . Drug use: No    ROS   Objective:   Vitals: BP (!) 154/81 (BP Location: Left Arm)   Pulse 84   Temp 98.1 F (36.7 C) (Oral)   Resp 20   SpO2 97%   Physical Exam Constitutional:      General: She is not in acute distress.    Appearance: Normal appearance. She is well-developed. She is not ill-appearing, toxic-appearing or diaphoretic.  HENT:     Head: Normocephalic and atraumatic.     Nose: Nose normal.     Mouth/Throat:     Mouth: Mucous membranes are moist.  Eyes:     Extraocular Movements: Extraocular movements intact.     Pupils: Pupils are equal, round, and reactive to light.  Cardiovascular:     Rate and Rhythm: Normal rate and regular rhythm.     Pulses:  Normal pulses.     Heart sounds: Normal heart sounds. No murmur heard. No friction rub. No gallop.   Pulmonary:     Effort: Pulmonary effort is normal. No respiratory distress.     Breath sounds: Normal breath sounds. No stridor. No wheezing, rhonchi or rales.  Abdominal:     Tenderness: There is no right CVA tenderness or left CVA tenderness.  Skin:    General: Skin is warm and dry.     Findings: No rash.  Neurological:     Mental Status: She is alert and oriented to person, place, and time.  Psychiatric:        Mood and Affect: Mood normal.        Behavior: Behavior normal.        Thought Content: Thought content normal.        Judgment: Judgment normal.     Results for orders placed or performed during the hospital encounter of 09/12/20 (from the past 24 hour(s))  POC Urinalysis dipstick     Status: Abnormal   Collection Time: 09/12/20 11:22 AM  Result Value Ref Range   Glucose, UA NEGATIVE NEGATIVE mg/dL   Bilirubin Urine NEGATIVE NEGATIVE   Ketones, ur NEGATIVE NEGATIVE mg/dL   Specific Gravity, Urine 1.020 1.005 - 1.030   Hgb urine dipstick TRACE (A) NEGATIVE   pH 5.5 5.0 - 8.0   Protein, ur 100 (A) NEGATIVE mg/dL   Urobilinogen, UA 0.2 0.0 - 1.0 mg/dL   Nitrite POSITIVE (A) NEGATIVE   Leukocytes,Ua SMALL (A) NEGATIVE   DG Chest 2 View  Result Date: 09/12/2020 CLINICAL DATA:  Cough and nasal congestion. EXAM: CHEST - 2 VIEW COMPARISON:  May 31, 2018 FINDINGS: The heart size and mediastinal contours are within normal limits. Both lungs are clear. The visualized skeletal structures are stable. IMPRESSION: No active cardiopulmonary disease. Electronically Signed   By: Abelardo Diesel M.D.   On: 09/12/2020 11:44    Assessment and Plan :   PDMP not reviewed this encounter.  1. Acute cystitis with hematuria   2. Cough   3. Chest congestion   4. Shortness of breath     Start Keflex to cover for acute cystitis, urine culture pending.  As per up-to-date dosing, 500  mg twice daily is safe for CKD at her level.  Recommended hydration with plain water, limiting urinary irritants.  COVID-19 testing pending, recommended supportive care.  Maintain regular inhalers.  Counseled patient on potential for adverse effects with medications prescribed/recommended today, ER and return-to-clinic precautions discussed,  patient verbalized understanding.    Jaynee Eagles, PA-C 09/12/20 1148

## 2020-09-12 NOTE — ED Triage Notes (Signed)
Pt states that she haas a cough, nasal congestion, chest congestion, SOB, and weak. Pt states that her sx started a week ago but the cough started today. Pt had urine frequency and has a slight urine odor.

## 2020-09-12 NOTE — Discharge Instructions (Addendum)
Make sure you hydrate very well with plain water.  Please limit drinks that are considered urinary irritants such as soda, sweet tea, coffee, energy drinks, alcohol.  These can worsen your urinary and genital symptoms but also be the source of them.  I will let you know about your urine culture results through MyChart to see if we need to prescribe or change your antibiotics based off of those results.   We will let you know about your COVID test results through [facility].  In the meantime maintain your inhaler medications.  It is okay to use Zyrtec once daily at 5 mg. For sore throat or cough try using a honey-based tea. Use 3 teaspoons of honey with juice squeezed from half lemon. Place shaved pieces of ginger into 1/2-1 cup of water and warm over stove top. Then mix the ingredients and repeat every 4 hours as needed. Do not use any nonsteroidal anti-inflammatories (NSAIDs) like ibuprofen, Motrin, naproxen, Aleve, etc. which are all available over-the-counter.  Please just use Tylenol at a dose of 500mg -650mg  once every 6 hours as needed for your aches, pains, fevers.

## 2020-09-12 NOTE — ED Notes (Signed)
Pt O2 was checked while in the waiting room. Pt O2 was 96. PT Denies and SOB or chest pain at the moment.

## 2020-09-14 ENCOUNTER — Telehealth: Payer: Self-pay

## 2020-09-14 DIAGNOSIS — U071 COVID-19: Secondary | ICD-10-CM

## 2020-09-14 LAB — URINE CULTURE: Culture: >=100000 — AB

## 2020-09-14 NOTE — Telephone Encounter (Signed)
Called and spoke with daughter (ok per DPR) who stated that she went to the UC on Saturday and she is COVID positive and has a UTI. Granddaughter stated that patient is doing ok and denies any SOB for the patient. UC and ED precautions given to granddaughter who verbalized understanding.

## 2020-09-14 NOTE — Telephone Encounter (Signed)
[facility] Night - Client TELEPHONE ADVICE RECORD [facility] Patient Name: Jill [name] Gender: Female DOB: 09/22/1931 Age: 87 Y 14 M 4 D Return Phone Number: [phone] (Primary), [phone] (Secondary) [place] [address] Client  [facility] Night - Client Client Site [place] Physician Eliezer Lofts - MD Contact Type Call Who Is Calling Patient / Member / Family / Caregiver Call Type Triage / Clinical Caller Name Meha Vidrine Return Phone Number [phone] (Secondary) Chief Complaint BREATHING - shortness of breath or sounds breathless Reason for Call Symptomatic / Request for [place] states patient is coughing badly and is having shortness of breath. Patient is in heart failure, stage 3 kidney failure, and COPD. She is also suffering from a UTI, not able to control her urine output. Caller states patient normally gets pneumonia around this time of year. Translation No Nurse Assessment Nurse: Self, RN, Nira Conn Date/Time (Eastern Time): 09/12/2020 9:46:08 AM Confirm and document reason for call. If symptomatic, describe symptoms. ---Caller says possible to Covid , cough and SOB , Caller also concerned about UTI s/s Does the patient have any new or worsening symptoms? ---Yes Will a triage be completed? ---Yes Related visit to physician within the last 2 weeks? ---No Does the PT have any chronic conditions? (i.e. diabetes, asthma, this includes High risk factors for pregnancy, etc.) ---Yes List chronic conditions. ---CKD, COPD Is this a behavioral health or substance abuse call? ---No Guidelines Guideline Title Affirmed Question Affirmed Notes Nurse Date/Time (Eastern Time) COVID-19 - Diagnosed or Suspected MODERATE difficulty breathing (e.g., speaks in phrases, SOB even at rest, pulse 100-120) Self, RN, Heather 09/12/2020 9:47:41  AM Disp. Time [name] Time) Disposition Final User 09/12/2020 9:44:25 AM Send to Urgent Lorrin Mais 09/12/2020 9:49:13 AM Go to ED Now Yes Self, RN, Nira Conn PLEASE NOTE: All timestamps contained within this report are represented as [place] Standard Time. CONFIDENTIALTY NOTICE: This fax transmission is intended only for the addressee. It contains information that is legally privileged, confidential or otherwise protected from use or disclosure. If you are not the intended recipient, you are strictly prohibited from reviewing, disclosing, copying using or disseminating any of this information or taking any action in reliance on or regarding this information. If you have received this fax in error, please notify us immediately by telephone so that we can arrange for its return to [place]. Phone: [phone], Toll-Free: [phone], Fax: [phone] Page: 2 of 2 Call Id: [id number] [place] Disagree/Comply Comply Caller Understands Yes PreDisposition Call Doctor Care Advice Given Per Guideline GO TO ED NOW: * You need to be seen in the Emergency Department. CALL EMS 911 IF: * Severe difficulty breathing occurs * Confusion occurs. * Lips or face turns blue CARE ADVICE given per COVID-19 - DIAGNOSED OR SUSPECTED (Adult) guideline. Referrals GO TO FACILITY UNDECIDED

## 2020-09-15 NOTE — Telephone Encounter (Signed)
Call patient  If Mychart message following not reviewed:   You are a candidate for  Consideration of monoclonal antibody infusion and antivirals given you are moderate to high risk for COVID complications but mild symptoms. This infusion gives your body more power to fight COVID before even before your body can mount a defense. If you are interested in this or have other symptoms control questions... call, send a message a message or make a virtual appointment to be seen If shortness of breath  begins or symptoms worsening.. have low threshold for in-person exam at an Urgent Care, if severe shortness of breath ER visit recommended.  You can an monitor Oxygen saturation at home with home monitor if able to obtain.  Go to ER if O2 sat < 90% on room air. Hope this helps!   Eliezer Lofts, MD [place] [facility]   0 - 2 Points: Low Risk  3 - 6 Points: Medium Risk  7 - 16 Points: High Risk    Last Change: N/A     This score indicates the number of risk factors that a patient over the age of 84 has for severe illness or mortality from a COVID-19 infection. A lower score means fewer risk factors and is preferred.        Is Immunocompromised : No   Age: 87   Sex: Female   Has Chronic Pulmonary Disease: Yes   Has Congenital Heart Disease : No   Has Congestive Heart Failure: Yes   Has Coronary Artery Disease: No   Has Diabetes: Yes   Has End-Stage Liver Disease: No   Has End-Stage Renal Disease: No   Has Hypertension: Yes   Has Obesity: Yes   Resides in Nursing Home: No   Pregnant: No

## 2020-09-16 MED ORDER — SPIRIVA HANDIHALER 18 MCG IN CAPS: ORAL_CAPSULE | RESPIRATORY_TRACT | 5 refills | Status: DC

## 2020-09-16 NOTE — Addendum Note (Signed)
Addended byEliezer Lofts E on: 09/16/2020 12:20 PM   Modules accepted: Orders

## 2020-09-16 NOTE — Telephone Encounter (Signed)
I will place the referral

## 2020-09-29 ENCOUNTER — Encounter: Payer: Self-pay | Admitting: Family Medicine

## 2020-09-29 ENCOUNTER — Other Ambulatory Visit: Payer: Self-pay

## 2020-09-29 ENCOUNTER — Ambulatory Visit: Payer: Medicare PPO | Admitting: Family Medicine

## 2020-09-29 VITALS — BP 140/80 | HR 70 | Temp 97.8°F | Ht 62.0 in | Wt 160.5 lb

## 2020-09-29 DIAGNOSIS — W19XXXA Unspecified fall, initial encounter: Secondary | ICD-10-CM | POA: Diagnosis not present

## 2020-09-29 DIAGNOSIS — M25561 Pain in right knee: Secondary | ICD-10-CM | POA: Insufficient documentation

## 2020-09-29 DIAGNOSIS — M25562 Pain in left knee: Secondary | ICD-10-CM | POA: Insufficient documentation

## 2020-09-29 NOTE — Assessment & Plan Note (Signed)
Reviewed home safety and encouraged cane/walker use.

## 2020-09-29 NOTE — Progress Notes (Signed)
Patient ID: Jill [name], female    DOB: 05/07/1932, 87 y.o.   MRN: [id number]  This visit was conducted in person.  BP 140/80   Pulse 70   Temp 97.8 F (36.6 C) (Temporal)   Ht 5\' 2"  (1.575 m)   Wt 160 lb 8 oz (72.8 kg)   SpO2 97%   BMI 29.36 kg/m    CC:  Chief Complaint  Patient presents with  . Fall    A couple a weeks ago  . Knee Pain    Left    Subjective:   HPI: Jill [name] is a 87 y.o. female presenting on 09/29/2020 for Fall (A couple a weeks ago) and Knee Pain (Left)   She had an accidental fall  2 weeks ago.. landed on left knee.  She tripped on new carpeting in living room.  Did not have cane with her. Since then pain and swelling in left knee.  Improving pain overall.. resting and elevating.  Able to weight bear and able to  [facility] left knee fully.      Relevant past medical, surgical, family and social history reviewed and updated as indicated. Interim medical history since our last visit reviewed. Allergies and medications reviewed and updated. Outpatient Medications Prior to Visit  Medication Sig Dispense Refill  . albuterol (PROVENTIL HFA;VENTOLIN HFA) 108 (90 Base) MCG/ACT inhaler Inhale 2 puffs into the lungs every 4 (four) hours as needed for wheezing or shortness of breath. 1 Inhaler 1  . cephALEXin (KEFLEX) 500 MG capsule Take 1 capsule (500 mg total) by mouth 2 (two) times daily. 14 capsule 0  . cetirizine (ZYRTEC) 5 MG tablet Take 1 tablet (5 mg total) by mouth daily. 90 tablet 0  . donepezil (ARICEPT) 5 MG tablet TAKE 1 TABLET BY MOUTH EVERYDAY AT BEDTIME 30 tablet 2  . escitalopram (LEXAPRO) 20 MG tablet TAKE 1 TABLET BY MOUTH EVERY DAY 90 tablet 1  . midodrine (PROAMATINE) 5 MG tablet Take 5 mg by mouth daily.    . Multiple Vitamins-Minerals (MULTIVITAMIN WITH MINERALS) tablet Take 1 tablet by mouth daily.    Marland Kitchen tiotropium (SPIRIVA HANDIHALER) 18 MCG inhalation capsule INHALE 1 CAPSULE VIA HANDIHALER ONCE DAILY AT THE SAME TIME EVERY DAY 30  capsule 5  . Vitamin D, Ergocalciferol, (DRISDOL) 1.25 MG (50000 UNIT) CAPS capsule TAKE 1 CAPSULE (50,000 UNITS TOTAL) BY MOUTH EVERY FRIDAY. 12 capsule 3   No facility-administered medications prior to visit.     Per HPI unless specifically indicated in ROS section below Review of Systems Objective:  BP 140/80   Pulse 70   Temp 97.8 F (36.6 C) (Temporal)   Ht 5\' 2"  (1.575 m)   Wt 160 lb 8 oz (72.8 kg)   SpO2 97%   BMI 29.36 kg/m   Wt Readings from Last 3 Encounters:  09/29/20 160 lb 8 oz (72.8 kg)  07/03/20 162 lb (73.5 kg)  04/09/20 160 lb 12.8 oz (72.9 kg)      Physical Exam Constitutional:      General: She is not in acute distress.Vital signs are normal.     Appearance: Normal appearance. She is well-developed and well-nourished. She is not ill-appearing or toxic-appearing.  HENT:     Head: Normocephalic.     Right Ear: Hearing, tympanic membrane, ear canal and external ear normal. Tympanic membrane is not erythematous, retracted or bulging.     Left Ear: Hearing, tympanic membrane, ear canal and external ear normal. Tympanic membrane  is not erythematous, retracted or bulging.     Nose: No mucosal edema or rhinorrhea.     Right Sinus: No maxillary sinus tenderness or frontal sinus tenderness.     Left Sinus: No maxillary sinus tenderness or frontal sinus tenderness.     Mouth/Throat:     Mouth: Oropharynx is clear and moist and mucous membranes are normal.     Pharynx: Uvula midline.  Eyes:     General: Lids are normal. Lids are everted, no foreign bodies appreciated.     Extraocular Movements: EOM normal.     Conjunctiva/sclera: Conjunctivae normal.     Pupils: Pupils are equal, round, and reactive to light.  Neck:     Thyroid: No thyroid mass or thyromegaly.     Vascular: No carotid bruit.     Trachea: Trachea normal.  Cardiovascular:     Rate and Rhythm: Normal rate and regular rhythm.     Pulses: Normal pulses and intact distal pulses.     Heart sounds:  Normal heart sounds, S1 normal and S2 normal. No murmur heard. No friction rub. No gallop.   Pulmonary:     Effort: Pulmonary effort is normal. No tachypnea or respiratory distress.     Breath sounds: Normal breath sounds. No decreased breath sounds, wheezing, rhonchi or rales.  Abdominal:     General: Bowel sounds are normal.     Palpations: Abdomen is soft.     Tenderness: There is no abdominal tenderness.  Musculoskeletal:     Cervical back: Normal range of motion and neck supple.     Left knee: Swelling present. No bony tenderness or crepitus. Normal range of motion. Tenderness present over the medial joint line. No lateral joint line, ACL, PCL or patellar tendon tenderness. Normal alignment, normal meniscus and normal patellar mobility.  Skin:    General: Skin is warm, dry and intact.     Findings: No rash.  Neurological:     Mental Status: She is alert.  Psychiatric:        Mood and Affect: Mood is not anxious or depressed.        Speech: Speech normal.        Behavior: Behavior normal. Behavior is cooperative.        Thought Content: Thought content normal.        Cognition and Memory: Cognition and memory normal.        Judgment: Judgment normal.       Results for orders placed or performed during the hospital encounter of 09/12/20  Urine culture   Specimen: Urine, Random  Result Value Ref Range   Specimen Description URINE, RANDOM    Special Requests      NONE Performed at [facility], [address], [place], [place] [address]    Culture >=100,000 COLONIES/mL ESCHERICHIA COLI (A)    Report Status 09/14/2020 FINAL    Organism ID, Bacteria ESCHERICHIA COLI (A)       Susceptibility   Escherichia coli - MIC*    AMPICILLIN >=32 RESISTANT Resistant     CEFAZOLIN <=4 SENSITIVE Sensitive     CEFEPIME <=0.12 SENSITIVE Sensitive     CEFTRIAXONE <=0.25 SENSITIVE Sensitive     CIPROFLOXACIN <=0.25 SENSITIVE Sensitive     GENTAMICIN >=16 RESISTANT Resistant      IMIPENEM <=0.25 SENSITIVE Sensitive     NITROFURANTOIN <=16 SENSITIVE Sensitive     TRIMETH/SULFA >=320 RESISTANT Resistant     AMPICILLIN/SULBACTAM 16 INTERMEDIATE Intermediate  PIP/TAZO <=4 SENSITIVE Sensitive     * >=100,000 COLONIES/mL ESCHERICHIA COLI  SARS CORONAVIRUS 2 (TAT 6-24 HRS) Nasopharyngeal Nasopharyngeal Swab   Specimen: Nasopharyngeal Swab  Result Value Ref Range   SARS Coronavirus 2 POSITIVE (A) NEGATIVE  POC Urinalysis dipstick  Result Value Ref Range   Glucose, UA NEGATIVE NEGATIVE mg/dL   Bilirubin Urine NEGATIVE NEGATIVE   Ketones, ur NEGATIVE NEGATIVE mg/dL   Specific Gravity, Urine 1.020 1.005 - 1.030   Hgb urine dipstick TRACE (A) NEGATIVE   pH 5.5 5.0 - 8.0   Protein, ur 100 (A) NEGATIVE mg/dL   Urobilinogen, UA 0.2 0.0 - 1.0 mg/dL   Nitrite POSITIVE (A) NEGATIVE   Leukocytes,Ua SMALL (A) NEGATIVE    This visit occurred during the SARS-CoV-2 public health emergency.  Safety protocols were in place, including screening questions prior to the visit, additional usage of staff PPE, and extensive cleaning of exam room while observing appropriate contact time as indicated for disinfecting solutions.   COVID 19 screen:  No recent travel or known exposure to COVID19 The patient denies respiratory symptoms of COVID 19 at this time. The importance of social distancing was discussed today.   Assessment and Plan    Problem List Items Addressed This Visit    Accidental fall    Reviewed home safety and encouraged cane/walker use.      Acute pain of left knee - Primary    No clear indication for imaging.  IMproving, likely bone bruise.  treat with topical Voltaren gel QID prn. Ice and elevation prn.          Eliezer Lofts, MD

## 2020-09-29 NOTE — Assessment & Plan Note (Signed)
No clear indication for imaging.  IMproving, likely bone bruise.  treat with topical Voltaren gel QID prn. Ice and elevation prn.

## 2020-09-29 NOTE — Patient Instructions (Signed)
Can ice or elevate if you want to.  Can use Voltaren gel or cream four times daily as needed for pain and swelling.  Call if not continuing to improve.

## 2020-10-12 ENCOUNTER — Other Ambulatory Visit: Payer: Self-pay | Admitting: Family Medicine

## 2020-10-12 NOTE — Telephone Encounter (Signed)
Last office visit 09/29/2020 for left knee pain.  Last refilled 10/11/2019 for #12 with 3 refills.  Last Vitamin D level 08/21/2019 which was normal at 80.48 ng/ml.  Ok to refill?

## 2020-10-13 ENCOUNTER — Encounter: Payer: Self-pay | Admitting: Family Medicine

## 2020-10-20 ENCOUNTER — Other Ambulatory Visit: Payer: Self-pay | Admitting: Family Medicine

## 2020-10-20 NOTE — Telephone Encounter (Signed)
Please schedule [facility] with nurse and CPE with Dr. Bedsole. 

## 2020-10-20 NOTE — Telephone Encounter (Signed)
Spoke with daughter Estill Bamberg scheduled [place] with nurse and CPE

## 2020-10-23 ENCOUNTER — Other Ambulatory Visit: Payer: Self-pay | Admitting: Family Medicine

## 2020-11-09 ENCOUNTER — Other Ambulatory Visit: Payer: Self-pay

## 2020-11-09 ENCOUNTER — Encounter (HOSPITAL_COMMUNITY): Payer: Self-pay

## 2020-11-09 ENCOUNTER — Ambulatory Visit (HOSPITAL_COMMUNITY)
Admission: EM | Admit: 2020-11-09 | Discharge: 2020-11-09 | Disposition: A | Discharge status: Home / Self Care | Payer: Medicare PPO | Attending: Emergency Medicine | Admitting: Emergency Medicine

## 2020-11-09 ENCOUNTER — Telehealth: Payer: Self-pay

## 2020-11-09 ENCOUNTER — Ambulatory Visit (HOSPITAL_COMMUNITY): Payer: Medicare PPO

## 2020-11-09 DIAGNOSIS — N390 Urinary tract infection, site not specified: Secondary | ICD-10-CM | POA: Diagnosis not present

## 2020-11-09 LAB — COMPREHENSIVE METABOLIC PANEL
ALT: 14 U/L (ref 0–44)
AST: 23 U/L (ref 15–41)
Albumin: 3.4 g/dL — ABNORMAL LOW (ref 3.5–5.0)
Alkaline Phosphatase: 74 U/L (ref 38–126)
Anion gap: 6 (ref 5–15)
BUN: 37 mg/dL — ABNORMAL HIGH (ref 8–23)
CO2: 26 mmol/L (ref 22–32)
Calcium: 9.1 mg/dL (ref 8.9–10.3)
Chloride: 101 mmol/L (ref 98–111)
Creatinine, Ser: 1.91 mg/dL — ABNORMAL HIGH (ref 0.44–1.00)
GFR, Estimated: 25 mL/min — ABNORMAL LOW (ref >60)
Glucose, Bld: 156 mg/dL — ABNORMAL HIGH (ref 70–99)
Potassium: 4.2 mmol/L (ref 3.5–5.1)
Sodium: 133 mmol/L — ABNORMAL LOW (ref 135–145)
Total Bilirubin: 1.1 mg/dL (ref 0.3–1.2)
Total Protein: 6.8 g/dL (ref 6.5–8.1)

## 2020-11-09 LAB — POCT URINALYSIS DIPSTICK, ED / UC
Bilirubin Urine: NEGATIVE
Glucose, UA: NEGATIVE mg/dL
Ketones, ur: NEGATIVE mg/dL
Nitrite: POSITIVE — AB
Protein, ur: >=300 mg/dL — AB
Specific Gravity, Urine: 1.02 (ref 1.005–1.030)
Urobilinogen, UA: 0.2 mg/dL (ref 0.0–1.0)
pH: 5.5 (ref 5.0–8.0)

## 2020-11-09 LAB — CBC WITH DIFFERENTIAL/PLATELET
Abs Immature Granulocytes: 0.49 10*3/uL — ABNORMAL HIGH (ref 0.00–0.07)
Basophils Absolute: 0 10*3/uL (ref 0.0–0.1)
Basophils Relative: 0 %
Eosinophils Absolute: 0 10*3/uL (ref 0.0–0.5)
Eosinophils Relative: 0 %
HCT: 37.5 % (ref 36.0–46.0)
Hemoglobin: 12.5 g/dL (ref 12.0–15.0)
Immature Granulocytes: 3 %
Lymphocytes Relative: 21 %
Lymphs Abs: 3.6 10*3/uL (ref 0.7–4.0)
MCH: 30.5 pg (ref 26.0–34.0)
MCHC: 33.3 g/dL (ref 30.0–36.0)
MCV: 91.5 fL (ref 80.0–100.0)
Monocytes Absolute: 2.7 10*3/uL — ABNORMAL HIGH (ref 0.1–1.0)
Monocytes Relative: 16 %
Neutro Abs: 10.2 10*3/uL — ABNORMAL HIGH (ref 1.7–7.7)
Neutrophils Relative %: 60 %
Platelets: 217 10*3/uL (ref 150–400)
RBC: 4.1 MIL/uL (ref 3.87–5.11)
RDW: 13.2 % (ref 11.5–15.5)
WBC: 17 10*3/uL — ABNORMAL HIGH (ref 4.0–10.5)
nRBC: 0 % (ref 0.0–0.2)

## 2020-11-09 LAB — POC INFLUENZA A AND B ANTIGEN (URGENT CARE ONLY)
Influenza A Ag: NEGATIVE
Influenza B Ag: NEGATIVE

## 2020-11-09 LAB — CBG MONITORING, ED: Glucose-Capillary: 158 mg/dL — ABNORMAL HIGH (ref 70–99)

## 2020-11-09 MED ORDER — CEFTRIAXONE SODIUM 1 G IJ SOLR
INTRAMUSCULAR | Status: AC
  Filled 2020-11-09: qty 10

## 2020-11-09 MED ORDER — LIDOCAINE HCL (PF) 1 % IJ SOLN
INTRAMUSCULAR | Status: AC
  Filled 2020-11-09: qty 2

## 2020-11-09 MED ORDER — CEPHALEXIN 500 MG PO CAPS: 500.0000 mg | ORAL_CAPSULE | Freq: Two times a day (BID) | ORAL | 0 refills | Status: DC

## 2020-11-09 MED ORDER — CEFTRIAXONE SODIUM 1 G IJ SOLR
1.0000 g | Freq: Once | INTRAMUSCULAR | Status: AC
Start: 2020-11-09 — End: 2020-11-09
  Administered 2020-11-09: 1 g via INTRAMUSCULAR

## 2020-11-09 NOTE — ED Provider Notes (Signed)
[place]    CSN: [id number] Arrival date & time: 11/09/20  1652      History   Chief Complaint Chief Complaint  Patient presents with  . Fever  . Weakness    HPI Jill [name] is a 87 y.o. female.   Jill [name] presents with her grand daughter with complaints of weakness which started yesterday. Today has had fever up to 101, and has been chilled, rigors noted by family. Weak and fatigued, napping. Family noted blood sugar over 200 which is abnormal for her, her diabetes medications were discontinued and she is not currently on any. Her Bp was elevated today up to 892 systolic as well. Otherwise no cough, no congestion, no chest pain , no shortness of breath , no abdominal pain, no urinary symptoms, no nausea vomiting or diarrhea. Did have a UTI a few months ago which was noted with incontinence, which her granddaughter currently denies noting. She had covid 19 in January of this year. History of ckd, dm, htn, mild dementia, copd, recurrent utis    ROS per HPI, negative if not otherwise mentioned.      Past Medical History:  Diagnosis Date  . Allergy   . Arthritis    osteoarthritis   . Chronic kidney disease   . Complication of anesthesia    difficult waking   . Depression   . Diabetes mellitus   . Diverticula, colon   . Family history of anesthesia complication    Son is difficult intubation  . Hyperlipidemia   . Hypertension   . Vasovagal syncope 2006   Negative cardiac workup-myoview, echo    Patient Active Problem List   Diagnosis Date Noted  . Acute pain of left knee 09/29/2020  . Mild dementia ([place]) 07/03/2020  . Acute pain of right shoulder 07/03/2020  . Left foot pain 04/09/2020  . Acute infectious diarrhea 05/21/2019  . Bilateral leg weakness 05/21/2019  . Pulmonary nodule 05/29/2018  . Urinary urgency 02/03/2017  . Bilateral buttock pain 02/02/2017  . Gout involving toe of right foot 12/23/2016  . [place] intertrigo 10/28/2016   . Stage 2 moderate COPD by GOLD classification ([facility]) 09/13/2016  . Type 2 diabetes mellitus with stage 3 chronic kidney disease ([place]) 08/08/2016  . Chronic diastolic congestive heart failure ([place]) 08/08/2016  . Intracranial bleed ([place])   . BPPV (benign paroxysmal positional vertigo) 06/24/2016  . Memory loss 06/24/2016  . Accidental fall 06/24/2016  . Chronic neck pain 03/18/2016  . Neuropathy due to type 2 diabetes mellitus ([place]) 03/18/2016  . Anemia in chronic kidney disease 12/15/2015  . Incomplete bladder emptying 10/15/2015  . Abscess, renal/perirenal 10/15/2015  . Recurrent UTI 10/15/2015  . Lumbar back pain with radiculopathy affecting left lower extremity 05/05/2015  . Renal abscess 01/28/2015  . Chronic fatigue 01/22/2015  . Chronic idiopathic urticaria 01/17/2015  . S/P hip replacement 05/16/2014  . DNR (do not resuscitate) 02/20/2014  . ALLERGIC RHINITIS 12/17/2008  . Vitamin D deficiency 07/01/2008  . CKD (chronic kidney disease) stage 4, GFR 15-29 ml/min ([facility]) 02/11/2008  . DIVERTICULOSIS OF COLON 02/01/2008  . COLONIC POLYPS, ADENOMATOUS 10/19/2006  . Type 2 diabetes mellitus with diabetic neuropathy, unspecified ([place]) 10/19/2006  . Hyperlipidemia 10/19/2006  . OBESITY, NOS 10/19/2006  . Major depression, recurrent ([place]) 10/19/2006  . HYPERTENSION, BENIGN SYSTEMIC 10/19/2006  . Recurrent urticaria 10/19/2006    Past Surgical History:  Procedure Laterality Date  . ABDOMINAL HYSTERECTOMY  1977   fibroid  . JOINT  REPLACEMENT  2009   rt hip  . TOTAL HIP ARTHROPLASTY Left 05/12/2014   dr Mayer Camel  . TOTAL HIP ARTHROPLASTY Left 05/12/2014   Procedure: TOTAL HIP ARTHROPLASTY;  Surgeon: Kerin Salen, MD;  Location: [facility];  Service: Orthopedics;  Laterality: Left;    OB History   No obstetric history on file.      Home Medications    Prior to Admission medications   Medication Sig Start Date End Date Taking? Authorizing Provider  albuterol (PROVENTIL  HFA;VENTOLIN HFA) 108 (90 Base) MCG/ACT inhaler Inhale 2 puffs into the lungs every 4 (four) hours as needed for wheezing or shortness of breath. 05/28/18   Tonia Ghent, MD  cephALEXin (KEFLEX) 500 MG capsule Take 1 capsule (500 mg total) by mouth 2 (two) times daily. 11/09/20   Zigmund Gottron, NP  cetirizine (ZYRTEC) 5 MG tablet Take 1 tablet (5 mg total) by mouth daily. 09/12/20   Jaynee Eagles, PA-C  donepezil (ARICEPT) 5 MG tablet TAKE 1 TABLET BY MOUTH EVERYDAY AT BEDTIME 10/20/20   Bedsole, Amy E, MD  escitalopram (LEXAPRO) 20 MG tablet TAKE 1 TABLET BY MOUTH EVERY DAY 10/23/20   Bedsole, Amy E, MD  midodrine (PROAMATINE) 5 MG tablet Take 5 mg by mouth daily. 04/18/19   [provider]  Multiple Vitamins-Minerals (MULTIVITAMIN WITH MINERALS) tablet Take 1 tablet by mouth daily.    [provider]  tiotropium (SPIRIVA HANDIHALER) 18 MCG inhalation capsule INHALE 1 CAPSULE VIA HANDIHALER ONCE DAILY AT THE SAME TIME EVERY DAY 09/16/20   Bedsole, Amy E, MD  Vitamin D, Ergocalciferol, (DRISDOL) 1.25 MG (50000 UNIT) CAPS capsule TAKE 1 CAPSULE (50,000 UNITS TOTAL) BY MOUTH EVERY FRIDAY. 10/16/20   Jinny Sanders, MD    Family History Family History  Problem Relation Age of Onset  . COPD Brother   . Heart disease Brother   . Diabetes Brother   . Lymphoma Brother   . Alzheimer's disease Brother   . Pancreatic cancer Sister   . Alzheimer's disease Sister   . Heart disease Mother   . Breast cancer Sister   . Leukemia Other   . Kidney disease Neg Hx     Social History Social History   Tobacco Use  . Smoking status: Former Smoker    Packs/day: 1.00    Years: 45.00    Pack years: 45.00    Types: Cigarettes    Quit date: 06/16/1999    Years since quitting: 21.4  . Smokeless tobacco: Never Used  . Tobacco comment: quit 15 years ago  Substance Use Topics  . Alcohol use: No    Alcohol/week: 0.0 standard drinks  . Drug use: No     Allergies   Lovastatin, Statins,  Sulfa antibiotics, Codeine, and Niacin   Review of Systems Review of Systems   Physical Exam Triage Vital Signs ED Triage Vitals  Enc Vitals Group     BP 11/09/20 1703 (!) 154/63     Pulse Rate 11/09/20 1703 87     Resp 11/09/20 1703 18     Temp 11/09/20 1703 98.9 F (37.2 C)     Temp Source 11/09/20 1703 Oral     SpO2 11/09/20 1703 98 %     Weight --      Height --      Head Circumference --      Peak Flow --      Pain Score 11/09/20 1711 0     Pain Loc --  Pain Edu? --      Excl. in [place]? --    No data found.  Updated Vital Signs BP (!) 154/63 (BP Location: Right Arm)   Pulse 87   Temp 98.9 F (37.2 C) (Oral)   Resp 18   SpO2 98%   Visual Acuity Right Eye Distance:   Left Eye Distance:   Bilateral Distance:    Right Eye Near:   Left Eye Near:    Bilateral Near:     Physical Exam Constitutional:      General: She is not in acute distress.    Appearance: She is well-developed.     Comments: Noted occasional chills/ shivers; in wheelchair   HENT:     Nose: Rhinorrhea present.     Comments: States this is baseline for her Cardiovascular:     Rate and Rhythm: Normal rate and regular rhythm.  Pulmonary:     Effort: Pulmonary effort is normal.     Breath sounds: Normal breath sounds.  Skin:    General: Skin is warm and dry.     Comments: Soft non tender cyst to mid back without redness swelling or induration  Neurological:     Mental Status: She is alert. Mental status is at baseline.      UC Treatments / Results  Labs (all labs ordered are listed, but only abnormal results are displayed) Labs Reviewed  CBG MONITORING, ED - Abnormal; Notable for the following components:      Result Value   Glucose-Capillary 158 (*)    All other components within normal limits  POCT URINALYSIS DIPSTICK, ED / UC - Abnormal; Notable for the following components:   Hgb urine dipstick MODERATE (*)    Protein, ur >=300 (*)    Nitrite POSITIVE (*)     Leukocytes,Ua SMALL (*)    All other components within normal limits  URINE CULTURE  CBC WITH DIFFERENTIAL/PLATELET  COMPREHENSIVE METABOLIC PANEL  POC INFLUENZA A AND B ANTIGEN (URGENT CARE ONLY)    EKG   Radiology No results found.  Procedures Procedures (including critical care time)  Medications Ordered in UC Medications  cefTRIAXone (ROCEPHIN) injection 1 g (has no administration in time range)    Initial Impression / Assessment and Plan / UC Course  I have reviewed the triage vital signs and the nursing notes.  Pertinent labs & imaging results that were available during my care of the patient were reviewed by me and considered in my medical decision making (see chart for details).     Urine tonight is consistent with UTI. Mild rigors noted and fever today at home, opted to provide rocephin for this 87 year old female, as well as course of keflex pending urine culture. Baseline labs obtained as well. Follow up and return precautions discussed. Patient and grand daughter verbalized understanding and agreeable to plan.   Final Clinical Impressions(s) / UC Diagnoses   Final diagnoses:  Lower urinary tract infectious disease     Discharge Instructions     Complete course of antibiotics.  Drink plenty of water to empty bladder regularly. Avoid alcohol and caffeine as these may irritate the bladder.   Please follow up with your PCP for recheck in two weeks.  Return or go to the ER for any worsening of symptoms.     ED Prescriptions    Medication Sig Dispense Auth. Provider   cephALEXin (KEFLEX) 500 MG capsule Take 1 capsule (500 mg total) by mouth 2 (two) times daily. [place]  capsule Zigmund Gottron, NP     PDMP not reviewed this encounter.   Zigmund Gottron, NP 11/09/20 [id number]

## 2020-11-09 NOTE — ED Notes (Signed)
Per granddaughter, pt blood sugar was 359 yesterday,l 258 today.

## 2020-11-09 NOTE — ED Notes (Signed)
Pt unable to urinate at this time, given water 

## 2020-11-09 NOTE — ED Notes (Signed)
Vital sign checked at waiting area. Pt denies chest pain, headache, SOB, dizziness, vision changes.

## 2020-11-09 NOTE — Telephone Encounter (Signed)
[facility] - Client TELEPHONE ADVICE RECORD [facility] Patient Name: Jill [name] Gender: Female DOB: [birth year] Age: 87 Y 51 M 3 D Return Phone Number: [phone] (Primary), [phone] (Secondary) Address: City/State/Zip: [place] [address] Client [facility] - Client Client Site [place] - Day Physician Eliezer Lofts - MD Contact Type Call Who Is Calling Patient / Member / Family / Caregiver Call Type Triage / Clinical Caller Name Manda Relationship To Patient Daughter Return Phone Number [phone] (Primary) Chief Complaint ABDOMINAL PAIN - Severe and only in abdomen Reason for Call Symptomatic / Request for Health Information Initial Comment Caller's mother-in-law is cold to the touch, fever of 101, shivering, severe abd pain, and fatigued. [place] Translation No Nurse Assessment Nurse: Thad Ranger, RN, Langley Gauss Date/Time (Eastern Time): 11/09/2020 4:11:16 PM Confirm and document reason for call. If symptomatic, describe symptoms. ---Caller's mother-in-law is cold to the touch, fever of 101, shivering, severe abd pain, and fatigued. Has boil on her back that has been draining. Does the patient have any new or worsening symptoms? ---Yes Will a triage be completed? ---Yes Related visit to physician within the last 2 weeks? ---Yes Does the PT have any chronic conditions? (i.e. diabetes, asthma, this includes High risk factors for pregnancy, etc.) ---Yes List chronic conditions. ---CAD, Diabetes, Kidney dx Is this a behavioral health or substance abuse call? ---No Guidelines Guideline Title Affirmed Question Affirmed Notes Nurse Date/Time (Eastern Time) Boil (Skin Abscess) Fever > 100.4 F (38.0 C) Carmon, RN, Langley Gauss 11/09/2020 4:13:50 PM Disp. Time [name] Time) Disposition Final User 11/09/2020 4:07:00 PM Send to Urgent Ranae Plumber 11/09/2020  4:16:59 PM See HCP within 4 Hours (or PCP triage) Yes Carmon, RN, Langley Gauss PLEASE NOTE: All timestamps contained within this report are represented as [place] Standard Time. CONFIDENTIALTY NOTICE: This fax transmission is intended only for the addressee. It contains information that is legally privileged, confidential or otherwise protected from use or disclosure. If you are not the intended recipient, you are strictly prohibited from reviewing, disclosing, copying using or disseminating any of this information or taking any action in reliance on or regarding this information. If you have received this fax in error, please notify us immediately by telephone so that we can arrange for its return to [place]. Phone: [phone], Toll-Free: [phone], Fax: [phone] Page: 2 of 2 Call Id: [id number] [place] Disagree/Comply Comply Caller Understands Yes PreDisposition Call Doctor Care Advice Given Per Guideline SEE HCP (OR PCP TRIAGE) WITHIN 4 HOURS: DRESSING: * Cover with a sterile gauze or clean cloth. * Do this until seen. PAIN AND FEVER MEDICINES: * ACETAMINOPHEN REGULAR STRENGTH TYLENOL: Take 650 mg (two 325 mg pills) by mouth every 4-6 hours as needed. Each Regular Strength Tylenol pill has 325 mg of acetaminophen. The most you should take each day is 3,250 mg (10 pills a day). CALL BACK IF: * You become worse CARE ADVICE per Boil (Skin Abscess) (Adult) guideline. Referrals GO TO FACILITY OTHER - SPECIFY

## 2020-11-09 NOTE — Discharge Instructions (Addendum)
Complete course of antibiotics.  Drink plenty of water to empty bladder regularly. Avoid alcohol and caffeine as these may irritate the bladder.   Please follow up with your PCP for recheck in two weeks.  Return or go to the ER for any worsening of symptoms.

## 2020-11-09 NOTE — Telephone Encounter (Signed)
I spoke with pt's daughter Estill Bamberg and pt is on her way now to [facility] on [address] in [place]. Estill Bamberg is afraid she may need admission. Sending note to DR [facility]

## 2020-11-09 NOTE — ED Triage Notes (Signed)
Pt reports feeling weak, chills and fever 101.0 F x 1 day. Denies chest pain, dizziness, headache, vision changes. Pt has not taken any OTC meds for complaints.   Per granddaughter, pt had a cyst in the back and had some drainage 2 days ago.

## 2020-11-10 ENCOUNTER — Telehealth (HOSPITAL_COMMUNITY): Payer: Self-pay | Admitting: Emergency Medicine

## 2020-11-10 NOTE — Telephone Encounter (Signed)
After reviewing blood results, called to check up on patient, per Dr. Lanny Cramp.  Grandaughter says she is worsening, now having diarrhea, very weak.  Reviewed with Dr. Lanny Cramp who encouraged ER follow-up.  Grandaughter verbalized understanding.

## 2020-11-10 NOTE — Telephone Encounter (Signed)
Noted  

## 2020-11-11 LAB — URINE CULTURE: Culture: >=100000 — AB

## 2020-11-12 ENCOUNTER — Telehealth: Payer: Self-pay | Admitting: Family Medicine

## 2020-11-12 ENCOUNTER — Other Ambulatory Visit: Payer: Self-pay

## 2020-11-12 ENCOUNTER — Telehealth (INDEPENDENT_AMBULATORY_CARE_PROVIDER_SITE_OTHER): Payer: Medicare PPO | Admitting: Adult Health

## 2020-11-12 DIAGNOSIS — R197 Diarrhea, unspecified: Secondary | ICD-10-CM

## 2020-11-12 DIAGNOSIS — N3000 Acute cystitis without hematuria: Secondary | ICD-10-CM

## 2020-11-12 DIAGNOSIS — M10371 Gout due to renal impairment, right ankle and foot: Secondary | ICD-10-CM

## 2020-11-12 DIAGNOSIS — N183 Chronic kidney disease, stage 3 unspecified: Secondary | ICD-10-CM

## 2020-11-12 NOTE — Progress Notes (Signed)
Virtual Visit via Telephone Note  I connected with Jill [name] on 11/12/20 at  1:30 PM EDT by telephone and verified that I am speaking with the correct person using two identifiers.   I discussed the limitations, risks, security and privacy concerns of performing an evaluation and management service by telephone and the availability of in person appointments. I also discussed with the patient that there may be a patient responsible charge related to this service. The patient expressed understanding and agreed to proceed.  Location patient: home Location provider: work or home office Participants present for the call: patient, provider, granddaughter Patient did not have a visit in the prior 7 days to address this/these issue(s).   History of Present Illness: 87 year old female who was diagnosed with a UTI 3 days ago while at urgent care.  While at urgent care she received an injection of Rocephin and was prescribed a course of Keflex.  Her granddaughter today reports that soon after receiving the dose of Rocephin started to experience diarrhea.  The diarrhea has continued throughout the week.  Patient reports that she feels as though her diarrhea has improved and it was less loose today.  At home they have been trying the brat diet, yogurt, and Imodium.  They have not started the Keflex for the urinary tract infection yet.   Observations/Objective: Patient sounds cheerful and well on the phone. I do not appreciate any SOB. Speech and thought processing are grossly intact. Patient reported vitals:  Assessment and Plan: 1. Acute cystitis without hematuria - Start Keflex  2. Diarrhea, unspecified type - Seems to be improving  -Take probiotic pill and use Pepto-Bismol when taking antibiotic for her UTI. -Continue with brat diet - Pedilyte for hydration  - Follow up if diarrhea continues   Follow Up Instructions:   I did not refer this patient for an OV in the next 24 hours for  this/these issue(s).  I discussed the assessment and treatment plan with the patient. The patient was provided an opportunity to ask questions and all were answered. The patient agreed with the plan and demonstrated an understanding of the instructions.   The patient was advised to call back or seek an in-person evaluation if the symptoms worsen or if the condition fails to improve as anticipated.  I provided 13 minutes of non-face-to-face time during this encounter.   Dorothyann Peng, NP

## 2020-11-12 NOTE — Telephone Encounter (Signed)
-----   Message from Cloyd Stagers, RT sent at 11/02/2020  9:52 AM EDT ----- Regarding: Lab Orders for Friday 4.1.2022 Please place lab orders for Friday 4.1.2022, office visit for physical on Friday 4.8.2022  Thank you, Dyke Maes RT(R)

## 2020-11-14 ENCOUNTER — Emergency Department: Payer: Medicare PPO

## 2020-11-14 ENCOUNTER — Other Ambulatory Visit: Payer: Self-pay

## 2020-11-14 ENCOUNTER — Inpatient Hospital Stay
Admission: EM | Admit: 2020-11-14 | Discharge: 2020-11-21 | DRG: 371 | Disposition: A | Discharge status: Home / Self Care | Payer: Medicare PPO | Attending: Student | Admitting: Student

## 2020-11-14 ENCOUNTER — Encounter (HOSPITAL_COMMUNITY): Payer: Self-pay | Admitting: Emergency Medicine

## 2020-11-14 ENCOUNTER — Ambulatory Visit (HOSPITAL_COMMUNITY)
Admission: EM | Admit: 2020-11-14 | Discharge: 2020-11-14 | Disposition: A | Discharge status: Home / Self Care | Payer: Medicare PPO | Attending: Family Medicine | Admitting: Family Medicine

## 2020-11-14 DIAGNOSIS — N184 Chronic kidney disease, stage 4 (severe): Secondary | ICD-10-CM | POA: Diagnosis not present

## 2020-11-14 DIAGNOSIS — G9341 Metabolic encephalopathy: Secondary | ICD-10-CM | POA: Diagnosis present

## 2020-11-14 DIAGNOSIS — K5732 Diverticulitis of large intestine without perforation or abscess without bleeding: Secondary | ICD-10-CM | POA: Diagnosis not present

## 2020-11-14 DIAGNOSIS — I5032 Chronic diastolic (congestive) heart failure: Secondary | ICD-10-CM | POA: Diagnosis present

## 2020-11-14 DIAGNOSIS — Z825 Family history of asthma and other chronic lower respiratory diseases: Secondary | ICD-10-CM

## 2020-11-14 DIAGNOSIS — F32A Depression, unspecified: Secondary | ICD-10-CM | POA: Diagnosis present

## 2020-11-14 DIAGNOSIS — E1159 Type 2 diabetes mellitus with other circulatory complications: Secondary | ICD-10-CM | POA: Diagnosis present

## 2020-11-14 DIAGNOSIS — Z885 Allergy status to narcotic agent status: Secondary | ICD-10-CM

## 2020-11-14 DIAGNOSIS — E559 Vitamin D deficiency, unspecified: Secondary | ICD-10-CM | POA: Diagnosis present

## 2020-11-14 DIAGNOSIS — I130 Hypertensive heart and chronic kidney disease with heart failure and stage 1 through stage 4 chronic kidney disease, or unspecified chronic kidney disease: Secondary | ICD-10-CM | POA: Diagnosis present

## 2020-11-14 DIAGNOSIS — N390 Urinary tract infection, site not specified: Secondary | ICD-10-CM | POA: Insufficient documentation

## 2020-11-14 DIAGNOSIS — R197 Diarrhea, unspecified: Secondary | ICD-10-CM | POA: Diagnosis not present

## 2020-11-14 DIAGNOSIS — D631 Anemia in chronic kidney disease: Secondary | ICD-10-CM | POA: Diagnosis present

## 2020-11-14 DIAGNOSIS — Z90710 Acquired absence of both cervix and uterus: Secondary | ICD-10-CM | POA: Diagnosis not present

## 2020-11-14 DIAGNOSIS — E1140 Type 2 diabetes mellitus with diabetic neuropathy, unspecified: Secondary | ICD-10-CM | POA: Diagnosis present

## 2020-11-14 DIAGNOSIS — G309 Alzheimer's disease, unspecified: Secondary | ICD-10-CM | POA: Diagnosis present

## 2020-11-14 DIAGNOSIS — Z66 Do not resuscitate: Secondary | ICD-10-CM | POA: Diagnosis present

## 2020-11-14 DIAGNOSIS — B9620 Unspecified Escherichia coli [E. coli] as the cause of diseases classified elsewhere: Secondary | ICD-10-CM | POA: Diagnosis present

## 2020-11-14 DIAGNOSIS — K529 Noninfective gastroenteritis and colitis, unspecified: Secondary | ICD-10-CM | POA: Diagnosis not present

## 2020-11-14 DIAGNOSIS — E785 Hyperlipidemia, unspecified: Secondary | ICD-10-CM | POA: Diagnosis present

## 2020-11-14 DIAGNOSIS — M47816 Spondylosis without myelopathy or radiculopathy, lumbar region: Secondary | ICD-10-CM | POA: Diagnosis not present

## 2020-11-14 DIAGNOSIS — Z87440 Personal history of urinary (tract) infections: Secondary | ICD-10-CM | POA: Diagnosis not present

## 2020-11-14 DIAGNOSIS — J309 Allergic rhinitis, unspecified: Secondary | ICD-10-CM | POA: Diagnosis present

## 2020-11-14 DIAGNOSIS — E1122 Type 2 diabetes mellitus with diabetic chronic kidney disease: Secondary | ICD-10-CM | POA: Diagnosis present

## 2020-11-14 DIAGNOSIS — E1169 Type 2 diabetes mellitus with other specified complication: Secondary | ICD-10-CM | POA: Diagnosis present

## 2020-11-14 DIAGNOSIS — I9589 Other hypotension: Secondary | ICD-10-CM | POA: Diagnosis present

## 2020-11-14 DIAGNOSIS — R531 Weakness: Secondary | ICD-10-CM

## 2020-11-14 DIAGNOSIS — Z20822 Contact with and (suspected) exposure to covid-19: Secondary | ICD-10-CM | POA: Diagnosis not present

## 2020-11-14 DIAGNOSIS — I152 Hypertension secondary to endocrine disorders: Secondary | ICD-10-CM | POA: Diagnosis present

## 2020-11-14 DIAGNOSIS — Z96643 Presence of artificial hip joint, bilateral: Secondary | ICD-10-CM | POA: Diagnosis present

## 2020-11-14 DIAGNOSIS — Z882 Allergy status to sulfonamides status: Secondary | ICD-10-CM

## 2020-11-14 DIAGNOSIS — E860 Dehydration: Secondary | ICD-10-CM | POA: Diagnosis present

## 2020-11-14 DIAGNOSIS — A0471 Enterocolitis due to Clostridium difficile, recurrent: Secondary | ICD-10-CM | POA: Diagnosis present

## 2020-11-14 DIAGNOSIS — Z820 Family history of epilepsy and other diseases of the nervous system: Secondary | ICD-10-CM

## 2020-11-14 DIAGNOSIS — Z8249 Family history of ischemic heart disease and other diseases of the circulatory system: Secondary | ICD-10-CM

## 2020-11-14 DIAGNOSIS — Z8616 Personal history of COVID-19: Secondary | ICD-10-CM

## 2020-11-14 DIAGNOSIS — J449 Chronic obstructive pulmonary disease, unspecified: Secondary | ICD-10-CM | POA: Diagnosis present

## 2020-11-14 DIAGNOSIS — A0472 Enterocolitis due to Clostridium difficile, not specified as recurrent: Principal | ICD-10-CM

## 2020-11-14 DIAGNOSIS — N281 Cyst of kidney, acquired: Secondary | ICD-10-CM | POA: Diagnosis not present

## 2020-11-14 DIAGNOSIS — Z833 Family history of diabetes mellitus: Secondary | ICD-10-CM

## 2020-11-14 DIAGNOSIS — M79672 Pain in left foot: Secondary | ICD-10-CM | POA: Diagnosis not present

## 2020-11-14 DIAGNOSIS — Z79899 Other long term (current) drug therapy: Secondary | ICD-10-CM

## 2020-11-14 DIAGNOSIS — Z888 Allergy status to other drugs, medicaments and biological substances status: Secondary | ICD-10-CM

## 2020-11-14 DIAGNOSIS — K449 Diaphragmatic hernia without obstruction or gangrene: Secondary | ICD-10-CM | POA: Diagnosis not present

## 2020-11-14 DIAGNOSIS — I10 Essential (primary) hypertension: Secondary | ICD-10-CM | POA: Diagnosis present

## 2020-11-14 DIAGNOSIS — Z87891 Personal history of nicotine dependence: Secondary | ICD-10-CM

## 2020-11-14 LAB — COMPREHENSIVE METABOLIC PANEL
ALT: 17 U/L (ref 0–44)
ALT: 16 U/L (ref 0–44)
AST: 23 U/L (ref 15–41)
AST: 21 U/L (ref 15–41)
Albumin: 2.9 g/dL — ABNORMAL LOW (ref 3.5–5.0)
Albumin: 3.3 g/dL — ABNORMAL LOW (ref 3.5–5.0)
Alkaline Phosphatase: 64 U/L (ref 38–126)
Alkaline Phosphatase: 63 U/L (ref 38–126)
Anion gap: 10 (ref 5–15)
Anion gap: 10 (ref 5–15)
BUN: 37 mg/dL — ABNORMAL HIGH (ref 8–23)
BUN: 41 mg/dL — ABNORMAL HIGH (ref 8–23)
CO2: 23 mmol/L (ref 22–32)
CO2: 23 mmol/L (ref 22–32)
Calcium: 8.9 mg/dL (ref 8.9–10.3)
Calcium: 8.9 mg/dL (ref 8.9–10.3)
Chloride: 104 mmol/L (ref 98–111)
Chloride: 103 mmol/L (ref 98–111)
Creatinine, Ser: 2.13 mg/dL — ABNORMAL HIGH (ref 0.44–1.00)
Creatinine, Ser: 2.18 mg/dL — ABNORMAL HIGH (ref 0.44–1.00)
GFR, Estimated: 22 mL/min — ABNORMAL LOW (ref >60)
GFR, Estimated: 22 mL/min — ABNORMAL LOW (ref >60)
Glucose, Bld: 169 mg/dL — ABNORMAL HIGH (ref 70–99)
Glucose, Bld: 220 mg/dL — ABNORMAL HIGH (ref 70–99)
Potassium: 3.8 mmol/L (ref 3.5–5.1)
Potassium: 4.1 mmol/L (ref 3.5–5.1)
Sodium: 137 mmol/L (ref 135–145)
Sodium: 136 mmol/L (ref 135–145)
Total Bilirubin: 0.6 mg/dL (ref 0.3–1.2)
Total Bilirubin: 0.7 mg/dL (ref 0.3–1.2)
Total Protein: 6.4 g/dL — ABNORMAL LOW (ref 6.5–8.1)
Total Protein: 7.1 g/dL (ref 6.5–8.1)

## 2020-11-14 LAB — C DIFFICILE QUICK SCREEN W PCR REFLEX
C Diff antigen: POSITIVE — AB
C Diff interpretation: DETECTED
C Diff toxin: POSITIVE — AB

## 2020-11-14 LAB — LIPASE, BLOOD: Lipase: 26 U/L (ref 11–51)

## 2020-11-14 LAB — CBC
HCT: 37.9 % (ref 36.0–46.0)
Hemoglobin: 12.4 g/dL (ref 12.0–15.0)
MCH: 29.7 pg (ref 26.0–34.0)
MCHC: 32.7 g/dL (ref 30.0–36.0)
MCV: 90.7 fL (ref 80.0–100.0)
Platelets: 293 10*3/uL (ref 150–400)
RBC: 4.18 MIL/uL (ref 3.87–5.11)
RDW: 13 % (ref 11.5–15.5)
WBC: 13.9 10*3/uL — ABNORMAL HIGH (ref 4.0–10.5)
nRBC: 0 % (ref 0.0–0.2)

## 2020-11-14 LAB — CBC WITH DIFFERENTIAL/PLATELET
Abs Immature Granulocytes: 0.2 10*3/uL — ABNORMAL HIGH (ref 0.00–0.07)
Basophils Absolute: 0 10*3/uL (ref 0.0–0.1)
Basophils Relative: 0 %
Eosinophils Absolute: 0 10*3/uL (ref 0.0–0.5)
Eosinophils Relative: 0 %
HCT: 37.9 % (ref 36.0–46.0)
Hemoglobin: 12.2 g/dL (ref 12.0–15.0)
Lymphocytes Relative: 22 %
Lymphs Abs: 3.4 10*3/uL (ref 0.7–4.0)
MCH: 29.8 pg (ref 26.0–34.0)
MCHC: 32.2 g/dL (ref 30.0–36.0)
MCV: 92.7 fL (ref 80.0–100.0)
Monocytes Absolute: 2.4 10*3/uL — ABNORMAL HIGH (ref 0.1–1.0)
Monocytes Relative: 16 %
Neutro Abs: 9.3 10*3/uL — ABNORMAL HIGH (ref 1.7–7.7)
Neutrophils Relative %: 61 %
Platelets: 283 10*3/uL (ref 150–400)
Promyelocytes Relative: 1 %
RBC: 4.09 MIL/uL (ref 3.87–5.11)
RDW: 12.9 % (ref 11.5–15.5)
WBC: 15.3 10*3/uL — ABNORMAL HIGH (ref 4.0–10.5)
nRBC: 0 % (ref 0.0–0.2)
nRBC: 0 /100 WBC

## 2020-11-14 LAB — GLUCOSE, CAPILLARY: Glucose-Capillary: 115 mg/dL — ABNORMAL HIGH (ref 70–99)

## 2020-11-14 LAB — GASTROINTESTINAL PANEL BY PCR, STOOL (REPLACES STOOL CULTURE)

## 2020-11-14 LAB — MAGNESIUM: Magnesium: 2.3 mg/dL (ref 1.7–2.4)

## 2020-11-14 LAB — URINALYSIS, COMPLETE (UACMP) WITH MICROSCOPIC
Bilirubin Urine: NEGATIVE
Glucose, UA: NEGATIVE mg/dL
Ketones, ur: NEGATIVE mg/dL
Leukocytes,Ua: NEGATIVE
Nitrite: NEGATIVE
Protein, ur: 30 mg/dL — AB
Specific Gravity, Urine: 1.016 (ref 1.005–1.030)
Squamous Epithelial / HPF: NONE SEEN (ref 0–5)
WBC, UA: NONE SEEN WBC/hpf (ref 0–5)
pH: 5 (ref 5.0–8.0)

## 2020-11-14 LAB — PHOSPHORUS: Phosphorus: 3.8 mg/dL (ref 2.5–4.6)

## 2020-11-14 MED ORDER — SODIUM CHLORIDE 0.9 % IV SOLN: INTRAVENOUS | Status: DC

## 2020-11-14 MED ORDER — INSULIN ASPART 100 UNIT/ML ~~LOC~~ SOLN: 0.0000 [IU] | Freq: Every day | SUBCUTANEOUS | Status: DC

## 2020-11-14 MED ORDER — LACTATED RINGERS IV SOLN: INTRAVENOUS | Status: DC

## 2020-11-14 MED ORDER — ONDANSETRON HCL 4 MG/2ML IJ SOLN: 4.0000 mg | Freq: Four times a day (QID) | INTRAMUSCULAR | Status: DC | PRN

## 2020-11-14 MED ORDER — VANCOMYCIN 50 MG/ML ORAL SOLUTION
125.0000 mg | Freq: Four times a day (QID) | ORAL | Status: DC
  Administered 2020-11-14 – 2020-11-21 (×27): 125 mg via ORAL
  Filled 2020-11-14 (×31): qty 2.5

## 2020-11-14 MED ORDER — INSULIN ASPART 100 UNIT/ML ~~LOC~~ SOLN
0.0000 [IU] | Freq: Three times a day (TID) | SUBCUTANEOUS | Status: DC
  Administered 2020-11-15 – 2020-11-16 (×3): 2 [IU] via SUBCUTANEOUS
  Administered 2020-11-16: 1 [IU] via SUBCUTANEOUS
  Administered 2020-11-17: 2 [IU] via SUBCUTANEOUS
  Administered 2020-11-17: 1 [IU] via SUBCUTANEOUS
  Administered 2020-11-18: 2 [IU] via SUBCUTANEOUS
  Administered 2020-11-18 (×2): 1 [IU] via SUBCUTANEOUS
  Administered 2020-11-19: 2 [IU] via SUBCUTANEOUS
  Filled 2020-11-14 (×10): qty 1

## 2020-11-14 MED ORDER — DICLOFENAC SODIUM 1 % EX GEL: 2.0000 g | Freq: Four times a day (QID) | CUTANEOUS | 1 refills | Status: DC

## 2020-11-14 MED ORDER — ONDANSETRON HCL 4 MG PO TABS: 4.0000 mg | ORAL_TABLET | Freq: Four times a day (QID) | ORAL | Status: DC | PRN

## 2020-11-14 MED ORDER — VANCOMYCIN 50 MG/ML ORAL SOLUTION: 125.0000 mg | ORAL | Status: DC

## 2020-11-14 MED ORDER — VANCOMYCIN 50 MG/ML ORAL SOLUTION
125.0000 mg | Freq: Four times a day (QID) | ORAL | Status: DC
  Filled 2020-11-14: qty 2.5

## 2020-11-14 MED ORDER — METRONIDAZOLE IN NACL 5-0.79 MG/ML-% IV SOLN: 500.0000 mg | Freq: Three times a day (TID) | INTRAVENOUS | Status: DC

## 2020-11-14 MED ORDER — VANCOMYCIN 50 MG/ML ORAL SOLUTION: 125.0000 mg | Freq: Two times a day (BID) | ORAL | Status: DC

## 2020-11-14 MED ORDER — VANCOMYCIN 50 MG/ML ORAL SOLUTION: 125.0000 mg | Freq: Four times a day (QID) | ORAL | Status: DC

## 2020-11-14 MED ORDER — DOXYCYCLINE HYCLATE 100 MG PO TABS: 100.0000 mg | ORAL_TABLET | Freq: Two times a day (BID) | ORAL | 0 refills | Status: DC

## 2020-11-14 MED ORDER — HEPARIN SODIUM (PORCINE) 5000 UNIT/ML IJ SOLN
5000.0000 [IU] | Freq: Three times a day (TID) | INTRAMUSCULAR | Status: DC
  Administered 2020-11-14 – 2020-11-21 (×20): 5000 [IU] via SUBCUTANEOUS
  Filled 2020-11-14 (×20): qty 1

## 2020-11-14 MED ORDER — VANCOMYCIN 50 MG/ML ORAL SOLUTION: 125.0000 mg | Freq: Every day | ORAL | Status: DC

## 2020-11-14 NOTE — ED Notes (Signed)
Got a call from [facility] that pt was C-Diff positive    Critical Lab C-Diff Positive  Cuthriell, Charline Bills, PA-C

## 2020-11-14 NOTE — Discharge Instructions (Addendum)
If you worsen at any point, please go to the ER for further evaluation, IV hydration and monitoring. Follow-up with your primary care first thing Monday morning.

## 2020-11-14 NOTE — ED Notes (Signed)
Pt attempted to urinate , unable to get sample

## 2020-11-14 NOTE — ED Notes (Signed)
Message sent to floor  Nurse  

## 2020-11-14 NOTE — ED Notes (Signed)
Pt tr

## 2020-11-14 NOTE — ED Notes (Signed)
Unable to collect stool /urine sample. Pt attempted to collect sample but contaminated urine with stool. Pt was informed .

## 2020-11-14 NOTE — ED Provider Notes (Signed)
[facility] Emergency Department Provider Note  ____________________________________________  Time seen: Approximately 3:51 PM  I have reviewed the triage vital signs and the nursing notes.   HISTORY  Chief Complaint Diarrhea    HPI Jill [name] is a 87 y.o. female who presents emergency department with his daughter for complaint of diarrhea, weakness.  Patient was noted to have a fever of roughly 7 to 10 days ago.  She was taken to urgent care 5 days ago and was diagnosed with a UTI.  Given a shot of Rocephin and prescribed antibiotics at home.  Patient developed significant diarrhea and has not been able to tolerate her oral antibiotics since her diagnosis.  Patient denies any dysuria, polyuria currently.  Her primary complaint is diarrhea.  There is no significant abdominal pain reported.  No nausea or emesis is reported.  No return of fever since her fever 7 to 10 days ago.  Patient does have a history of diverticulosis, diabetes, chronic kidney disease, hypertension.  Other than Rocephin, no recent antibiotic usage.   Diarrhea is nonbloody.        Past Medical History:  Diagnosis Date  . Allergy   . Arthritis    osteoarthritis   . Chronic kidney disease   . Complication of anesthesia    difficult waking   . Depression   . Diabetes mellitus   . Diverticula, colon   . Family history of anesthesia complication    Son is difficult intubation  . Hyperlipidemia   . Hypertension   . Vasovagal syncope 2006   Negative cardiac workup-myoview, echo    Patient Active Problem List   Diagnosis Date Noted  . Acute pain of left knee 09/29/2020  . Mild dementia ([place]) 07/03/2020  . Acute pain of right shoulder 07/03/2020  . Left foot pain 04/09/2020  . Acute infectious diarrhea 05/21/2019  . Bilateral leg weakness 05/21/2019  . Pulmonary nodule 05/29/2018  . Urinary urgency 02/03/2017  . Bilateral buttock pain 02/02/2017  . Gout involving toe of right  foot 12/23/2016  . [place] intertrigo 10/28/2016  . Stage 2 moderate COPD by GOLD classification ([place]) 09/13/2016  . Type 2 diabetes mellitus with stage 3 chronic kidney disease ([place]) 08/08/2016  . Chronic diastolic congestive heart failure ([place]) 08/08/2016  . Intracranial bleed ([place])   . BPPV (benign paroxysmal positional vertigo) 06/24/2016  . Memory loss 06/24/2016  . Accidental fall 06/24/2016  . Chronic neck pain 03/18/2016  . Neuropathy due to type 2 diabetes mellitus ([place]) 03/18/2016  . Anemia in chronic kidney disease 12/15/2015  . Incomplete bladder emptying 10/15/2015  . Abscess, renal/perirenal 10/15/2015  . Recurrent UTI 10/15/2015  . Lumbar back pain with radiculopathy affecting left lower extremity 05/05/2015  . Renal abscess 01/28/2015  . Chronic fatigue 01/22/2015  . Chronic idiopathic urticaria 01/17/2015  . S/P hip replacement 05/16/2014  . DNR (do not resuscitate) 02/20/2014  . ALLERGIC RHINITIS 12/17/2008  . Vitamin D deficiency 07/01/2008  . CKD (chronic kidney disease) stage 4, GFR 15-29 ml/min (HCC) 02/11/2008  . DIVERTICULOSIS OF COLON 02/01/2008  . COLONIC POLYPS, ADENOMATOUS 10/19/2006  . Type 2 diabetes mellitus with diabetic neuropathy, unspecified ([place]) 10/19/2006  . Hyperlipidemia 10/19/2006  . OBESITY, NOS 10/19/2006  . Major depression, recurrent ([place]) 10/19/2006  . HYPERTENSION, BENIGN SYSTEMIC 10/19/2006  . Recurrent urticaria 10/19/2006    Past Surgical History:  Procedure Laterality Date  . ABDOMINAL HYSTERECTOMY  1977   fibroid  . JOINT REPLACEMENT  2009  rt hip  . TOTAL HIP ARTHROPLASTY Left 05/12/2014   dr Mayer Camel  . TOTAL HIP ARTHROPLASTY Left 05/12/2014   Procedure: TOTAL HIP ARTHROPLASTY;  Surgeon: Kerin Salen, MD;  Location: [place];  Service: Orthopedics;  Laterality: Left;    Prior to Admission medications   Medication Sig Start Date End Date Taking? Authorizing Provider  albuterol (PROVENTIL HFA;VENTOLIN HFA) 108 (90 Base)  MCG/ACT inhaler Inhale 2 puffs into the lungs every 4 (four) hours as needed for wheezing or shortness of breath. 05/28/18   Tonia Ghent, MD  bismuth subsalicylate (PEPTO BISMOL) 262 MG chewable tablet Chew 262 mg by mouth as needed for diarrhea or loose stools.    [provider]  cephALEXin (KEFLEX) 500 MG capsule Take 1 capsule (500 mg total) by mouth 2 (two) times daily. 11/09/20   Zigmund Gottron, NP  cetirizine (ZYRTEC) 5 MG tablet Take 1 tablet (5 mg total) by mouth daily. 09/12/20   Jaynee Eagles, PA-C  diclofenac Sodium (VOLTAREN) 1 % GEL Apply 2 g topically 4 (four) times daily. 11/14/20   Volney American, PA-C  donepezil (ARICEPT) 5 MG tablet TAKE 1 TABLET BY MOUTH EVERYDAY AT BEDTIME 10/20/20   Bedsole, Amy E, MD  doxycycline (VIBRA-TABS) 100 MG tablet Take 1 tablet (100 mg total) by mouth 2 (two) times daily. 11/14/20   Volney American, PA-C  escitalopram (LEXAPRO) 20 MG tablet TAKE 1 TABLET BY MOUTH EVERY DAY 10/23/20   Bedsole, Amy E, MD  midodrine (PROAMATINE) 5 MG tablet Take 5 mg by mouth daily. 04/18/19   [provider]  Multiple Vitamins-Minerals (MULTIVITAMIN WITH MINERALS) tablet Take 1 tablet by mouth daily.    [provider]  tiotropium (SPIRIVA HANDIHALER) 18 MCG inhalation capsule INHALE 1 CAPSULE VIA HANDIHALER ONCE DAILY AT THE SAME TIME EVERY DAY 09/16/20   Bedsole, Amy E, MD  Vitamin D, Ergocalciferol, (DRISDOL) 1.25 MG (50000 UNIT) CAPS capsule TAKE 1 CAPSULE (50,000 UNITS TOTAL) BY MOUTH EVERY FRIDAY. 10/16/20   Jinny Sanders, MD    Allergies Lovastatin, Statins, Sulfa antibiotics, Codeine, and Niacin  Family History  Problem Relation Age of Onset  . COPD Brother   . Heart disease Brother   . Diabetes Brother   . Lymphoma Brother   . Alzheimer's disease Brother   . Pancreatic cancer Sister   . Alzheimer's disease Sister   . Heart disease Mother   . Breast cancer Sister   . Leukemia Other   . Kidney disease Neg Hx      Social History Social History   Tobacco Use  . Smoking status: Former Smoker    Packs/day: 1.00    Years: 45.00    Pack years: 45.00    Types: Cigarettes    Quit date: 06/16/1999    Years since quitting: 21.4  . Smokeless tobacco: Never Used  . Tobacco comment: quit 15 years ago  Substance Use Topics  . Alcohol use: No    Alcohol/week: 0.0 standard drinks  . Drug use: No     Review of Systems  Constitutional: No fever/chills Eyes: No visual changes. No discharge ENT: No upper respiratory complaints. Cardiovascular: no chest pain. Respiratory: no cough. No SOB. Gastrointestinal: No abdominal pain.  No nausea, no vomiting.  Significant diarrhea.  No constipation. Genitourinary: Negative for dysuria. No hematuria.  Known UTI Musculoskeletal: Negative for musculoskeletal pain. Skin: Negative for rash, abrasions, lacerations, ecchymosis. Neurological: Negative for headaches, focal weakness or numbness.  10 System ROS otherwise  negative.  ____________________________________________   PHYSICAL EXAM:  VITAL SIGNS: ED Triage Vitals  Enc Vitals Group     BP 11/14/20 1334 (!) 142/58     Pulse Rate 11/14/20 1334 78     Resp 11/14/20 1334 18     Temp 11/14/20 1334 98 F (36.7 C)     Temp Source 11/14/20 1334 Oral     SpO2 11/14/20 1334 96 %     Weight 11/14/20 1335 160 lb (72.6 kg)     Height 11/14/20 1335 5\' 3"  (1.6 m)     Head Circumference --      Peak Flow --      Pain Score 11/14/20 1333 0     Pain Loc --      Pain Edu? --      Excl. in [place]? --      Constitutional: Alert and oriented. Well appearing and in no acute distress. Eyes: Conjunctivae are normal. PERRL. EOMI. Head: Atraumatic. ENT:      Ears:       Nose: No congestion/rhinnorhea.      Mouth/Throat: Mucous membranes are moist.  Neck: No stridor.    Cardiovascular: Normal rate, regular rhythm. Normal S1 and S2.  Good peripheral circulation. Respiratory: Normal respiratory effort without  tachypnea or retractions. Lungs CTAB. Good air entry to the bases with no decreased or absent breath sounds. Gastrointestinal: Bowel sounds 4 quadrants. Soft and nontender to palpation. No guarding or rigidity. No palpable masses. No distention. No CVA tenderness. Musculoskeletal: Full range of motion to all extremities. No gross deformities appreciated. Neurologic:  Normal speech and language. No gross focal neurologic deficits are appreciated.  Skin:  Skin is warm, dry and intact. No rash noted. Psychiatric: Mood and affect are normal. Speech and behavior are normal. Patient exhibits appropriate insight and judgement.   ____________________________________________   LABS (all labs ordered are listed, but only abnormal results are displayed)  Labs Reviewed  C DIFFICILE QUICK SCREEN W PCR REFLEX - Abnormal; Notable for the following components:      Result Value   C Diff antigen POSITIVE (*)    C Diff toxin POSITIVE (*)    All other components within normal limits  COMPREHENSIVE METABOLIC PANEL - Abnormal; Notable for the following components:   Glucose, Bld 220 (*)    BUN 41 (*)    Creatinine, Ser 2.18 (*)    Albumin 3.3 (*)    GFR, Estimated 22 (*)    All other components within normal limits  CBC - Abnormal; Notable for the following components:   WBC 13.9 (*)    All other components within normal limits  URINALYSIS, COMPLETE (UACMP) WITH MICROSCOPIC - Abnormal; Notable for the following components:   Color, Urine YELLOW (*)    APPearance CLEAR (*)    Hgb urine dipstick SMALL (*)    Protein, ur 30 (*)    Bacteria, UA RARE (*)    All other components within normal limits  GASTROINTESTINAL PANEL BY PCR, STOOL (REPLACES STOOL CULTURE)  LIPASE, BLOOD   ____________________________________________  EKG   ____________________________________________  RADIOLOGY I personally viewed and evaluated these images as part of my medical decision making, as well as reviewing the  written report by the radiologist.  ED Provider Interpretation: No toxic megacolon, ileus.  Patient does have findings consistent with mild colitis.  CT ABDOMEN PELVIS WO CONTRAST  Result Date: 11/14/2020 CLINICAL DATA:  Right history of: Diverticulitis suspected Nausea, vomiting, diarrhea. Recently diagnosed with urinary tract  infection. EXAM: CT ABDOMEN AND PELVIS WITHOUT CONTRAST TECHNIQUE: Multidetector CT imaging of the abdomen and pelvis was performed following the standard protocol without IV contrast. COMPARISON:  Noncontrast CT 01/27/2015, renal mass protocol MRI 07/10/2015 FINDINGS: Lower chest: Small bilateral pulmonary nodules are stable from 2016 and considered benign. No acute airspace disease. No pleural effusion. Trace pericardial effusion. Hepatobiliary: No focal hepatic abnormality on noncontrast exam. No gallstones, pericholecystic fat stranding or biliary dilatation. Pancreas: Mild parenchymal atrophy. No ductal dilatation or inflammation. Spleen: Normal in size without focal abnormality. Adrenals/Urinary Tract: Mild adrenal thickening without dominant adrenal nodule. Bilateral renal parenchymal thinning and areas of scarring. No hydronephrosis or renal calculi. No significant perinephric edema. Bilateral renal cysts as well as scattered high-density lesions that are incompletely characterized on this noncontrast exam. One of these lesions in the lower left kidney appears increased in size from prior exam measuring 19 mm, series 2, image 35, with some internal high density. Urinary bladder is grossly negative, partially obscured by motion and streak artifact from bilateral hip arthroplasties. Stomach/Bowel: Small hiatal hernia. Stomach is decompressed. No small bowel obstruction or inflammation. Normal appendix. Small volume of colonic stool. There is colonic tortuosity. Diverticulosis from the splenic flexure distally. There is mild fat stranding adjacent to a diverticulum at the junction  of the descending and sigmoid colon, series 2, image 52, suspicious for acute diverticulitis. No evidence of abscess or perforation. Suggestion of mild sigmoid colonic wall thickening diffusely. Portions of the sigmoid colon are obscured by streak artifact from bilateral hip arthroplasties. Vascular/Lymphatic: Aorto bi-iliac atherosclerosis. No aortic aneurysm. Small retroperitoneal lymph nodes are not enlarged by size criteria. No enlarged lymph nodes in the abdomen or pelvis. Reproductive: Hysterectomy.  Quiescent ovaries.  No adnexal mass. Other: No free air, ascites, or focal fluid collection. There is a tiny fat containing umbilical hernia. Musculoskeletal: Bilateral hip arthroplasties. Diffuse degenerative change in the lumbar spine. There are no acute or suspicious osseous abnormalities. IMPRESSION: 1. Mild fat stranding adjacent to a diverticulum at the junction of the descending and sigmoid colon, suspicious for acute diverticulitis. No perforation or abscess. 2. Suggestion of mild diffuse sigmoid colonic wall thickening. This is in the region of multiple diverticula. Findings may be due to mural hypertrophy, mild colitis is difficult to exclude. 3. Bilateral renal parenchymal thinning and areas of scarring. Bilateral renal cysts as well as scattered high-density lesions that are incompletely characterized on this noncontrast exam. One of these lesions in the lower left kidney appears increased in size from prior exam measuring 19 mm, with some internal high density. Recommend nonemergent renal protocol MRI for characterization on an elective basis, giving consideration to patient's advanced age. MRI should allow me considered if patient is able to tolerate breath hold technique. 4. Small hiatal hernia. Aortic Atherosclerosis (ICD10-I70.0). Electronically Signed   By: Keith Rake M.D.   On: 11/14/2020 17:36    ____________________________________________    PROCEDURES  Procedure(s) performed:     Procedures    Medications  vancomycin (VANCOCIN) 50 mg/mL oral solution 125 mg (has no administration in time range)     ____________________________________________   INITIAL IMPRESSION / ASSESSMENT AND PLAN / ED COURSE  Pertinent labs & imaging results that were available during my care of the patient were reviewed by me and considered in my medical decision making (see chart for details).  Review of the [place] CSRS was performed in accordance of the [place] prior to dispensing any controlled drugs.  Patient's diagnosis is consistent with C. difficile.  Patient presented to emergency department complaining of weakness, decreased oral intake, significant diarrhea.  Patient was diagnosed with a UTI 5 days ago.  Given Rocephin in the urgent care, discharged with oral antibiotics.  Patient started to develop nausea, significant diarrhea and has not had an appetite or been able to keep down antibiotic medicines for her UTI.  Patient is reporting increased weakness.  No abdominal pain.  She does have a history of diverticulosis.  Given the findings differential included diverticulitis, diverticulosis, colitis, C. difficile, viral gastroenteritis.  Findings on CT were consistent with mild colitis.  Patient was positive for C. difficile.  Oral bank started here in the emergency department.  Given the increasing weakness, inability to keep oral antibiotics down at home for her UTI, will admit the patient to the hospital service at this time..     ____________________________________________  FINAL CLINICAL IMPRESSION(S) / ED DIAGNOSES  Final diagnoses:  C. difficile diarrhea  Weakness      NEW MEDICATIONS STARTED DURING THIS VISIT:  ED Discharge Orders    None          This chart was dictated using voice recognition software/Dragon. Despite best efforts to proofread, errors can occur which can change the meaning. Any change was purely unintentional.    Darletta Moll, PA-C 11/14/20 1913    Duffy Bruce, MD 11/16/20 [phone]

## 2020-11-14 NOTE — ED Provider Notes (Signed)
[facility]    CSN: [id number] Arrival date & time: 11/14/20  1051      History   Chief Complaint Chief Complaint  Patient presents with  . Diarrhea  . Foot Pain    HPI Jill [name] is a 87 y.o. female.   Patient presenting today with family member for evaluation of significant diarrhea the past 4 days.  She was seen 5 days ago in the urgent care for fever, chills, urinary symptoms and diagnosed with a urinary tract infection.  She was given a shot of Rocephin in clinic and shortly after this started with frequent watery diarrhea.  This improved slightly over the next few days until trying to start her Keflex given for the urinary tract infection which she started last night.  Symptoms returned worse than before after starting the Keflex.  She has had for urgent watery stools already this morning and caregiver states anytime she tries to eat or drink anything it spares a another episode.  Very strong odor to her diarrhea.  Nonbloody, no mucus in stools.  Patient denies recent fever, continued urinary symptoms or weakness, abdominal pain, nausea, vomiting.  Has not been tolerating much by mouth due to lack of appetite.  She is also having left distal foot redness and pain since waking up this morning.  Does have history of gout and states this feels similar to past gout attacks.  Has not tried anything over-the-counter for this at this time.     Past Medical History:  Diagnosis Date  . Allergy   . Arthritis    osteoarthritis   . Chronic kidney disease   . Complication of anesthesia    difficult waking   . Depression   . Diabetes mellitus   . Diverticula, colon   . Family history of anesthesia complication    Son is difficult intubation  . Hyperlipidemia   . Hypertension   . Vasovagal syncope 2006   Negative cardiac workup-myoview, echo    Patient Active Problem List   Diagnosis Date Noted  . Acute pain of left knee 09/29/2020  . Mild dementia ([place])  07/03/2020  . Acute pain of right shoulder 07/03/2020  . Left foot pain 04/09/2020  . Acute infectious diarrhea 05/21/2019  . Bilateral leg weakness 05/21/2019  . Pulmonary nodule 05/29/2018  . Urinary urgency 02/03/2017  . Bilateral buttock pain 02/02/2017  . Gout involving toe of right foot 12/23/2016  . [place] intertrigo 10/28/2016  . Stage 2 moderate COPD by GOLD classification ([place]) 09/13/2016  . Type 2 diabetes mellitus with stage 3 chronic kidney disease ([place]) 08/08/2016  . Chronic diastolic congestive heart failure ([place]) 08/08/2016  . Intracranial bleed ([place])   . BPPV (benign paroxysmal positional vertigo) 06/24/2016  . Memory loss 06/24/2016  . Accidental fall 06/24/2016  . Chronic neck pain 03/18/2016  . Neuropathy due to type 2 diabetes mellitus ([place]) 03/18/2016  . Anemia in chronic kidney disease 12/15/2015  . Incomplete bladder emptying 10/15/2015  . Abscess, renal/perirenal 10/15/2015  . Recurrent UTI 10/15/2015  . Lumbar back pain with radiculopathy affecting left lower extremity 05/05/2015  . Renal abscess 01/28/2015  . Chronic fatigue 01/22/2015  . Chronic idiopathic urticaria 01/17/2015  . S/P hip replacement 05/16/2014  . DNR (do not resuscitate) 02/20/2014  . ALLERGIC RHINITIS 12/17/2008  . Vitamin D deficiency 07/01/2008  . CKD (chronic kidney disease) stage 4, GFR 15-29 ml/min ([facility]) 02/11/2008  . DIVERTICULOSIS OF COLON 02/01/2008  . COLONIC POLYPS, ADENOMATOUS 10/19/2006  .  Type 2 diabetes mellitus with diabetic neuropathy, unspecified ([place]) 10/19/2006  . Hyperlipidemia 10/19/2006  . OBESITY, NOS 10/19/2006  . Major depression, recurrent ([place]) 10/19/2006  . HYPERTENSION, BENIGN SYSTEMIC 10/19/2006  . Recurrent urticaria 10/19/2006    Past Surgical History:  Procedure Laterality Date  . ABDOMINAL HYSTERECTOMY  1977   fibroid  . JOINT REPLACEMENT  2009   rt hip  . TOTAL HIP ARTHROPLASTY Left 05/12/2014   dr Mayer Camel  . TOTAL HIP ARTHROPLASTY Left  05/12/2014   Procedure: TOTAL HIP ARTHROPLASTY;  Surgeon: Kerin Salen, MD;  Location: [facility];  Service: Orthopedics;  Laterality: Left;    OB History   No obstetric history on file.      Home Medications    Prior to Admission medications   Medication Sig Start Date End Date Taking? Authorizing Provider  bismuth subsalicylate (PEPTO BISMOL) 262 MG chewable tablet Chew 262 mg by mouth as needed for diarrhea or loose stools.   Yes [provider]  cephALEXin (KEFLEX) 500 MG capsule Take 1 capsule (500 mg total) by mouth 2 (two) times daily. 11/09/20  Yes Augusto Gamble B, NP  cetirizine (ZYRTEC) 5 MG tablet Take 1 tablet (5 mg total) by mouth daily. 09/12/20  Yes Jaynee Eagles, PA-C  diclofenac Sodium (VOLTAREN) 1 % GEL Apply 2 g topically 4 (four) times daily. 11/14/20  Yes Volney American, PA-C  donepezil (ARICEPT) 5 MG tablet TAKE 1 TABLET BY MOUTH EVERYDAY AT BEDTIME 10/20/20  Yes Bedsole, Amy E, MD  doxycycline (VIBRA-TABS) 100 MG tablet Take 1 tablet (100 mg total) by mouth 2 (two) times daily. 11/14/20  Yes Volney American, PA-C  escitalopram (LEXAPRO) 20 MG tablet TAKE 1 TABLET BY MOUTH EVERY DAY 10/23/20  Yes Bedsole, Amy E, MD  midodrine (PROAMATINE) 5 MG tablet Take 5 mg by mouth daily. 04/18/19  Yes [provider]  Multiple Vitamins-Minerals (MULTIVITAMIN WITH MINERALS) tablet Take 1 tablet by mouth daily.   Yes [provider]  tiotropium (SPIRIVA HANDIHALER) 18 MCG inhalation capsule INHALE 1 CAPSULE VIA HANDIHALER ONCE DAILY AT THE SAME TIME EVERY DAY 09/16/20  Yes Bedsole, Amy E, MD  Vitamin D, Ergocalciferol, (DRISDOL) 1.25 MG (50000 UNIT) CAPS capsule TAKE 1 CAPSULE (50,000 UNITS TOTAL) BY MOUTH EVERY FRIDAY. 10/16/20  Yes Bedsole, Amy E, MD  albuterol (PROVENTIL HFA;VENTOLIN HFA) 108 (90 Base) MCG/ACT inhaler Inhale 2 puffs into the lungs every 4 (four) hours as needed for wheezing or shortness of breath. 05/28/18   Tonia Ghent, MD     Family History Family History  Problem Relation Age of Onset  . COPD Brother   . Heart disease Brother   . Diabetes Brother   . Lymphoma Brother   . Alzheimer's disease Brother   . Pancreatic cancer Sister   . Alzheimer's disease Sister   . Heart disease Mother   . Breast cancer Sister   . Leukemia Other   . Kidney disease Neg Hx     Social History Social History   Tobacco Use  . Smoking status: Former Smoker    Packs/day: 1.00    Years: 45.00    Pack years: 45.00    Types: Cigarettes    Quit date: 06/16/1999    Years since quitting: 21.4  . Smokeless tobacco: Never Used  . Tobacco comment: quit 15 years ago  Substance Use Topics  . Alcohol use: No    Alcohol/week: 0.0 standard drinks  . Drug use: No  Allergies   Lovastatin, Statins, Sulfa antibiotics, Codeine, and Niacin   Review of Systems Review of Systems Per HPI Physical Exam Triage Vital Signs ED Triage Vitals  Enc Vitals Group     BP 11/14/20 1113 (!) 117/54     Pulse Rate 11/14/20 1113 80     Resp 11/14/20 1113 18     Temp 11/14/20 1113 (!) 97.4 F (36.3 C)     Temp Source 11/14/20 1113 Oral     SpO2 11/14/20 1113 99 %     Weight --      Height --      Head Circumference --      Peak Flow --      Pain Score 11/14/20 1107 3     Pain Loc --      Pain Edu? --      Excl. in [place]? --    No data found.  Updated Vital Signs BP (!) 117/54 (BP Location: Right Arm)   Pulse 80   Temp (!) 97.4 F (36.3 C) (Oral)   Resp 18   SpO2 99%   Visual Acuity Right Eye Distance:   Left Eye Distance:   Bilateral Distance:    Right Eye Near:   Left Eye Near:    Bilateral Near:     Physical Exam Vitals and nursing note reviewed.  Constitutional:      Appearance: Normal appearance. She is not ill-appearing.  HENT:     Head: Atraumatic.     Nose: Nose normal.     Mouth/Throat:     Mouth: Mucous membranes are moist.     Pharynx: Oropharynx is clear.  Eyes:     Extraocular Movements:  Extraocular movements intact.     Conjunctiva/sclera: Conjunctivae normal.  Cardiovascular:     Rate and Rhythm: Normal rate and regular rhythm.     Heart sounds: Normal heart sounds.  Pulmonary:     Effort: Pulmonary effort is normal.     Breath sounds: Normal breath sounds. No wheezing or rales.  Abdominal:     General: There is no distension.     Palpations: Abdomen is soft. There is no mass.     Tenderness: There is no abdominal tenderness. There is no right CVA tenderness, left CVA tenderness, guarding or rebound.     Comments: Bowel sounds hyperactive diffusely  Musculoskeletal:     Cervical back: Normal range of motion and neck supple.     Comments: In wheelchair today, unclear what her baseline is at this time.  Good range of motion of extremities  Skin:    General: Skin is warm and dry.     Findings: No bruising, erythema or rash.  Neurological:     Mental Status: She is alert and oriented to person, place, and time.     Cranial Nerves: No cranial nerve deficit.  Psychiatric:        Mood and Affect: Mood normal.        Thought Content: Thought content normal.        Judgment: Judgment normal.      UC Treatments / Results  Labs (all labs ordered are listed, but only abnormal results are displayed) Labs Reviewed  CBC WITH DIFFERENTIAL/PLATELET - Abnormal; Notable for the following components:      Result Value   WBC 15.3 (*)    Neutro Abs 9.3 (*)    Monocytes Absolute 2.4 (*)    Abs Immature Granulocytes 0.20 (*)    All other  components within normal limits  COMPREHENSIVE METABOLIC PANEL - Abnormal; Notable for the following components:   Glucose, Bld 169 (*)    BUN 37 (*)    Creatinine, Ser 2.13 (*)    Total Protein 6.4 (*)    Albumin 2.9 (*)    GFR, Estimated 22 (*)    All other components within normal limits  C DIFFICILE QUICK SCREEN W PCR REFLEX  POCT URINALYSIS DIPSTICK, ED / UC    EKG   Radiology No results found.  Procedures Procedures  (including critical care time)  Medications Ordered in UC Medications - No data to display  Initial Impression / Assessment and Plan / UC Course  I have reviewed the triage vital signs and the nursing notes.  Pertinent labs & imaging results that were available during my care of the patient were reviewed by me and considered in my medical decision making (see chart for details).     She is afebrile today and in no acute distress.  Unable to give urine sample after 2 attempts due to poor stool continence at the moment and intermixing of stool with her urine specimen in urine hat.  CBC, CMP pending.  CBC was elevated per record review at visit 5 days ago where she was given IM antibiotics.  Unclear if her diarrhea is a side effect to the antibiotics or if this is indicative of viral GI illness or C. difficile infection.  Stool sample collected today for C. difficile rule out.  Continue antidiarrheal medication, push electrolyte solutions, will send doxycycline instead of the Keflex as she may be having a side effect to the cephalosporins she has been given for her UTI.  Urine culture sensitivities reviewed and no resistances shown.  Discussed at length with patient and caregiver to go to the ER immediately if symptoms worsen in any way. Will give Voltaren gel for her foot pain, potentially gout flare but with her history of diabetes and CKD have to treat very cautiously at this time.  Epsom salt soaks, elevation, tart cherry supplements reviewed for other good supportive care.  Follow-up with primary care for recheck.  Final Clinical Impressions(s) / UC Diagnoses   Final diagnoses:  Foot pain, left  Diarrhea, unspecified type  Acute lower UTI     Discharge Instructions     If you worsen at any point, please go to the ER for further evaluation, IV hydration and monitoring. Follow-up with your primary care first thing Monday morning.      ED Prescriptions    Medication Sig Dispense Auth.  Provider   diclofenac Sodium (VOLTAREN) 1 % GEL Apply 2 g topically 4 (four) times daily. 100 g Volney American, [place]   doxycycline (VIBRA-TABS) 100 MG tablet Take 1 tablet (100 mg total) by mouth 2 (two) times daily. 14 tablet Volney American, [place]     PDMP not reviewed this encounter.   Volney American, [place] 11/14/20 1658

## 2020-11-14 NOTE — ED Triage Notes (Signed)
Pt presents today with granddaughter. Pt was seen here 11/09/20 and dx with UTI. She began to have diarrhea that night and continues (4 episodes in 24 hours). She had telemed visit with PCP on Wednesday and began Pepto and Probiotic. Reports stool as very dark in color.

## 2020-11-14 NOTE — H&P (Signed)
History and Physical   Jill [name] TDD:[id number] DOB: 1932/01/04 DOA: 11/14/2020  Referring MD/NP/PA: Dr. Ellender Hose  PCP: Jinny Sanders, MD   Outpatient Specialists: None  Patient coming from: Home  Chief Complaint: Diarrhea and abdominal pain  HPI: Jill [name] is a 87 y.o. female with medical history significant of chronic kidney disease stage III, diabetes, hyperlipidemia, hypertension, osteoarthritis who presented to the ER with persistent diarrhea.  Patient was apparently treated about 7 to 10 days ago for UTI.  She did get Rocephin shot.  Since then she developed some diarrhea days later.  She was given oral antibiotics that she was unable to tolerate.  Denied any urinary symptoms.  Denied any nausea or vomiting but she was initially not able to take anything by mouth.  She has abdominal cramping.  Denied any melena no bright red blood per rectum.  Patient was seen in the ER and stool studies confirm C. difficile positive.  She is therefore being admitted to the hospital with C. difficile colitis with diarrhea.  She is having up to 6 bowel movements a day.  Has notably been mildly dehydrated..  ED Course: Temperature 98 blood pressure 164/77 pulse 86 respirate of 18 oxygen sat 96% on room air.  C. difficile is positive.  Urinalysis essentially negative.  Chemistry showed blood sugar 220 creatinine 2.18.  White count is 13.9 down from 15.3.  Otherwise the rest of the numbers appear stable.  GFR of 22.  CT abdomen pelvis without contrast shows possibly acute diverticulitis.  Also mild diffuse sigmoid colonic wall thickening.  Patient being admitted with acute colitis secondary to C. difficile.  Review of Systems: As per HPI otherwise 10 point review of systems negative.    Past Medical History:  Diagnosis Date  . Allergy   . Arthritis    osteoarthritis   . Chronic kidney disease   . Complication of anesthesia    difficult waking   . Depression   . Diabetes mellitus   .  Diverticula, colon   . Family history of anesthesia complication    Son is difficult intubation  . Hyperlipidemia   . Hypertension   . Vasovagal syncope 2006   Negative cardiac workup-myoview, echo    Past Surgical History:  Procedure Laterality Date  . ABDOMINAL HYSTERECTOMY  1977   fibroid  . JOINT REPLACEMENT  2009   rt hip  . TOTAL HIP ARTHROPLASTY Left 05/12/2014   dr Mayer Camel  . TOTAL HIP ARTHROPLASTY Left 05/12/2014   Procedure: TOTAL HIP ARTHROPLASTY;  Surgeon: Kerin Salen, MD;  Location: [facility];  Service: Orthopedics;  Laterality: Left;     reports that she quit smoking about 21 years ago. Her smoking use included cigarettes. She has a 45.00 pack-year smoking history. She has never used smokeless tobacco. She reports that she does not drink alcohol and does not use drugs.  Allergies  Allergen Reactions  . Lovastatin Other (See Comments)    REACTION: leg pain  . Statins Other (See Comments)    REACTION: leg cramps, weakness  . Sulfa Antibiotics Hives and Itching  . Codeine Rash  . Niacin Rash    Flushing also    Family History  Problem Relation Age of Onset  . COPD Brother   . Heart disease Brother   . Diabetes Brother   . Lymphoma Brother   . Alzheimer's disease Brother   . Pancreatic cancer Sister   . Alzheimer's disease Sister   . Heart disease Mother   .  Breast cancer Sister   . Leukemia Other   . Kidney disease Neg Hx      Prior to Admission medications   Medication Sig Start Date End Date Taking? Authorizing Provider  albuterol (PROVENTIL HFA;VENTOLIN HFA) 108 (90 Base) MCG/ACT inhaler Inhale 2 puffs into the lungs every 4 (four) hours as needed for wheezing or shortness of breath. 05/28/18   Tonia Ghent, MD  bismuth subsalicylate (PEPTO BISMOL) 262 MG chewable tablet Chew 262 mg by mouth as needed for diarrhea or loose stools.    [provider]  cephALEXin (KEFLEX) 500 MG capsule Take 1 capsule (500 mg total) by mouth 2 (two) times  daily. 11/09/20   Zigmund Gottron, NP  cetirizine (ZYRTEC) 5 MG tablet Take 1 tablet (5 mg total) by mouth daily. 09/12/20   Jaynee Eagles, PA-C  diclofenac Sodium (VOLTAREN) 1 % GEL Apply 2 g topically 4 (four) times daily. 11/14/20   Volney American, PA-C  donepezil (ARICEPT) 5 MG tablet TAKE 1 TABLET BY MOUTH EVERYDAY AT BEDTIME 10/20/20   Bedsole, Amy E, MD  doxycycline (VIBRA-TABS) 100 MG tablet Take 1 tablet (100 mg total) by mouth 2 (two) times daily. 11/14/20   Volney American, PA-C  escitalopram (LEXAPRO) 20 MG tablet TAKE 1 TABLET BY MOUTH EVERY DAY 10/23/20   Bedsole, Amy E, MD  midodrine (PROAMATINE) 5 MG tablet Take 5 mg by mouth daily. 04/18/19   [provider]  Multiple Vitamins-Minerals (MULTIVITAMIN WITH MINERALS) tablet Take 1 tablet by mouth daily.    [provider]  tiotropium (SPIRIVA HANDIHALER) 18 MCG inhalation capsule INHALE 1 CAPSULE VIA HANDIHALER ONCE DAILY AT THE SAME TIME EVERY DAY 09/16/20   Bedsole, Amy E, MD  Vitamin D, Ergocalciferol, (DRISDOL) 1.25 MG (50000 UNIT) CAPS capsule TAKE 1 CAPSULE (50,000 UNITS TOTAL) BY MOUTH EVERY FRIDAY. 10/16/20   Jinny Sanders, MD    Physical Exam: Vitals:   11/14/20 1335 11/14/20 1615 11/14/20 1700 11/14/20 1830  BP:  (!) 164/77 (!) 163/74 (!) 155/55  Pulse:  69 70 75  Resp:  18  18  Temp:  98 F (36.7 C)  98 F (36.7 C)  TempSrc:  Oral  Oral  SpO2:  100% 99% 98%  Weight: 72.6 kg     Height: 5\' 3"  (1.6 m)         Constitutional: Stable with no acute distress. Vitals:   11/14/20 1335 11/14/20 1615 11/14/20 1700 11/14/20 1830  BP:  (!) 164/77 (!) 163/74 (!) 155/55  Pulse:  69 70 75  Resp:  18  18  Temp:  98 F (36.7 C)  98 F (36.7 C)  TempSrc:  Oral  Oral  SpO2:  100% 99% 98%  Weight: 72.6 kg     Height: 5\' 3"  (1.6 m)      Eyes: PERRL, lids and conjunctivae normal ENMT: Mucous membranes are dry. Posterior pharynx clear of any exudate or lesions.Normal dentition.  Neck: normal,  supple, no masses, no thyromegaly Respiratory: clear to auscultation bilaterally, no wheezing, no crackles. Normal respiratory effort. No accessory muscle use.  Cardiovascular: Regular rate and rhythm, no murmurs / rubs / gallops. No extremity edema. 2+ pedal pulses. No carotid bruits.  Abdomen: Mild diffuse tenderness, no masses palpated. No hepatosplenomegaly. Bowel sounds positive.  Musculoskeletal: no clubbing / cyanosis. No joint deformity upper and lower extremities. Good ROM, no contractures. Normal muscle tone.  Skin: no rashes, lesions, ulcers. No induration Neurologic: CN 2-12 grossly intact. Sensation  intact, DTR normal. Strength 5/5 in all 4.  Psychiatric: Normal judgment and insight. Alert and oriented x 3. Normal mood.     Labs on Admission: I have personally reviewed following labs and imaging studies  CBC: Recent Labs  Lab 11/09/20 1806 11/14/20 1146 11/14/20 1337  WBC 17.0* 15.3* 13.9*  NEUTROABS 10.2* 9.3*  --   HGB 12.5 12.2 12.4  HCT 37.5 37.9 37.9  MCV 91.5 92.7 90.7  PLT 217 283 161   Basic Metabolic Panel: Recent Labs  Lab 11/09/20 1806 11/14/20 1146 11/14/20 1337  NA 133* 137 136  K 4.2 3.8 4.1  CL 101 104 103  CO2 26 23 23   GLUCOSE 156* 169* 220*  BUN 37* 37* 41*  CREATININE 1.91* 2.13* 2.18*  CALCIUM 9.1 8.9 8.9   GFR: Estimated Creatinine Clearance: 17.7 mL/min (A) (by C-G formula based on SCr of 2.18 mg/dL (H)). Liver Function Tests: Recent Labs  Lab 11/09/20 1806 11/14/20 1146 11/14/20 1337  AST 23 23 21   ALT 14 17 16   ALKPHOS 74 64 63  BILITOT 1.1 0.6 0.7  PROT 6.8 6.4* 7.1  ALBUMIN 3.4* 2.9* 3.3*   Recent Labs  Lab 11/14/20 1337  LIPASE 26   No results for input(s): AMMONIA in the last 168 hours. Coagulation Profile: No results for input(s): INR, PROTIME in the last 168 hours. Cardiac Enzymes: No results for input(s): CKTOTAL, CKMB, CKMBINDEX, TROPONINI in the last 168 hours. BNP (last 3 results) No results for  input(s): PROBNP in the last 8760 hours. HbA1C: No results for input(s): HGBA1C in the last 72 hours. CBG: Recent Labs  Lab 11/09/20 1721  GLUCAP 158*   Lipid Profile: No results for input(s): CHOL, HDL, LDLCALC, TRIG, CHOLHDL, LDLDIRECT in the last 72 hours. Thyroid Function Tests: No results for input(s): TSH, T4TOTAL, FREET4, T3FREE, THYROIDAB in the last 72 hours. Anemia Panel: No results for input(s): VITAMINB12, FOLATE, FERRITIN, TIBC, IRON, RETICCTPCT in the last 72 hours. Urine analysis:    Component Value Date/Time   COLORURINE YELLOW (A) 11/14/2020 1805   APPEARANCEUR CLEAR (A) 11/14/2020 1805   APPEARANCEUR Cloudy (A) 01/19/2016 1338   LABSPEC 1.016 11/14/2020 1805   PHURINE 5.0 11/14/2020 1805   GLUCOSEU NEGATIVE 11/14/2020 1805   HGBUR SMALL (A) 11/14/2020 1805   BILIRUBINUR NEGATIVE 11/14/2020 1805   BILIRUBINUR neg 12/28/2017 1229   BILIRUBINUR Negative 01/19/2016 [place] 11/14/2020 1805   PROTEINUR 30 (A) 11/14/2020 1805   UROBILINOGEN 0.2 11/09/2020 1834   NITRITE NEGATIVE 11/14/2020 1805   LEUKOCYTESUR NEGATIVE 11/14/2020 1805   Sepsis Labs: @LABRCNTIP (procalcitonin:4,lacticidven:4) ) Recent Results (from the past 240 hour(s))  Urine culture     Status: Abnormal   Collection Time: 11/09/20  6:42 PM   Specimen: Urine, Random  Result Value Ref Range Status   Specimen Description URINE, RANDOM  Final   Special Requests   Final    NONE Performed at [facility], [place] [address] [place], [place] [address]    Culture >=100,000 COLONIES/mL ESCHERICHIA COLI (A)  Final   Report Status 11/11/2020 FINAL  Final   Organism ID, Bacteria ESCHERICHIA COLI (A)  Final      Susceptibility   Escherichia coli - MIC*    AMPICILLIN 8 SENSITIVE Sensitive     CEFAZOLIN <=4 SENSITIVE Sensitive     CEFEPIME <=0.12 SENSITIVE Sensitive     CEFTRIAXONE <=0.25 SENSITIVE Sensitive     CIPROFLOXACIN <=0.25 SENSITIVE Sensitive     GENTAMICIN <=1  SENSITIVE Sensitive     IMIPENEM <=0.25 SENSITIVE Sensitive     NITROFURANTOIN 32 SENSITIVE Sensitive     TRIMETH/SULFA <=20 SENSITIVE Sensitive     AMPICILLIN/SULBACTAM <=2 SENSITIVE Sensitive     PIP/TAZO <=4 SENSITIVE Sensitive     * >=100,000 COLONIES/mL ESCHERICHIA COLI  C Difficile Quick Screen w PCR reflex     Status: Abnormal   Collection Time: 11/14/20  4:10 PM   Specimen: STOOL  Result Value Ref Range Status   C Diff antigen POSITIVE (A) NEGATIVE Final    Comment: RESULT CALLED TO, READ BACK BY AND VERIFIED WITH: brooke Myrick @ [id number] on 11/14/2020 by caf    C Diff toxin POSITIVE (A) NEGATIVE Final    Comment: RESULT CALLED TO, READ BACK BY AND VERIFIED WITH: Brooke Myrick @ [phone] on 11/14/2020 by Ventura Sellers Diff interpretation Toxin producing C. difficile detected.  Final    Comment: Performed at [facility], [address] [place], [place] [address]  Gastrointestinal Panel by PCR , Stool     Status: None   Collection Time: 11/14/20  4:10 PM   Specimen: STOOL  Result Value Ref Range Status   Campylobacter species NOT DETECTED NOT DETECTED Final   Plesimonas shigelloides NOT DETECTED NOT DETECTED Final   Salmonella species NOT DETECTED NOT DETECTED Final   Yersinia enterocolitica NOT DETECTED NOT DETECTED Final   Vibrio species NOT DETECTED NOT DETECTED Final   Vibrio cholerae NOT DETECTED NOT DETECTED Final   Enteroaggregative E coli (EAEC) NOT DETECTED NOT DETECTED Final   Enteropathogenic E coli (EPEC) NOT DETECTED NOT DETECTED Final   Enterotoxigenic E coli (ETEC) NOT DETECTED NOT DETECTED Final   Shiga like toxin producing E coli (STEC) NOT DETECTED NOT DETECTED Final   Shigella/Enteroinvasive E coli (EIEC) NOT DETECTED NOT DETECTED Final   Cryptosporidium NOT DETECTED NOT DETECTED Final   Cyclospora cayetanensis NOT DETECTED NOT DETECTED Final   Entamoeba histolytica NOT DETECTED NOT DETECTED Final   Giardia lamblia NOT DETECTED NOT DETECTED Final    Adenovirus F40/41 NOT DETECTED NOT DETECTED Final   Astrovirus NOT DETECTED NOT DETECTED Final   Norovirus GI/GII NOT DETECTED NOT DETECTED Final   Rotavirus A NOT DETECTED NOT DETECTED Final   Sapovirus (I, II, IV, and V) NOT DETECTED NOT DETECTED Final    Comment: Performed at [facility], [address] [place], [place] [address]     Radiological Exams on Admission: CT ABDOMEN PELVIS WO CONTRAST  Result Date: 11/14/2020 CLINICAL DATA:  Right history of: Diverticulitis suspected Nausea, vomiting, diarrhea. Recently diagnosed with urinary tract infection. EXAM: CT ABDOMEN AND PELVIS WITHOUT CONTRAST TECHNIQUE: Multidetector CT imaging of the abdomen and pelvis was performed following the standard protocol without IV contrast. COMPARISON:  Noncontrast CT 01/27/2015, renal mass protocol MRI 07/10/2015 FINDINGS: Lower chest: Small bilateral pulmonary nodules are stable from 2016 and considered benign. No acute airspace disease. No pleural effusion. Trace pericardial effusion. Hepatobiliary: No focal hepatic abnormality on noncontrast exam. No gallstones, pericholecystic fat stranding or biliary dilatation. Pancreas: Mild parenchymal atrophy. No ductal dilatation or inflammation. Spleen: Normal in size without focal abnormality. Adrenals/Urinary Tract: Mild adrenal thickening without dominant adrenal nodule. Bilateral renal parenchymal thinning and areas of scarring. No hydronephrosis or renal calculi. No significant perinephric edema. Bilateral renal cysts as well as scattered high-density lesions that are incompletely characterized on this noncontrast exam. One of these lesions in the lower left kidney appears increased in size from prior exam  measuring 19 mm, series 2, image 35, with some internal high density. Urinary bladder is grossly negative, partially obscured by motion and streak artifact from bilateral hip arthroplasties. Stomach/Bowel: Small hiatal hernia. Stomach is decompressed. No  small bowel obstruction or inflammation. Normal appendix. Small volume of colonic stool. There is colonic tortuosity. Diverticulosis from the splenic flexure distally. There is mild fat stranding adjacent to a diverticulum at the junction of the descending and sigmoid colon, series 2, image 52, suspicious for acute diverticulitis. No evidence of abscess or perforation. Suggestion of mild sigmoid colonic wall thickening diffusely. Portions of the sigmoid colon are obscured by streak artifact from bilateral hip arthroplasties. Vascular/Lymphatic: Aorto bi-iliac atherosclerosis. No aortic aneurysm. Small retroperitoneal lymph nodes are not enlarged by size criteria. No enlarged lymph nodes in the abdomen or pelvis. Reproductive: Hysterectomy.  Quiescent ovaries.  No adnexal mass. Other: No free air, ascites, or focal fluid collection. There is a tiny fat containing umbilical hernia. Musculoskeletal: Bilateral hip arthroplasties. Diffuse degenerative change in the lumbar spine. There are no acute or suspicious osseous abnormalities. IMPRESSION: 1. Mild fat stranding adjacent to a diverticulum at the junction of the descending and sigmoid colon, suspicious for acute diverticulitis. No perforation or abscess. 2. Suggestion of mild diffuse sigmoid colonic wall thickening. This is in the region of multiple diverticula. Findings may be due to mural hypertrophy, mild colitis is difficult to exclude. 3. Bilateral renal parenchymal thinning and areas of scarring. Bilateral renal cysts as well as scattered high-density lesions that are incompletely characterized on this noncontrast exam. One of these lesions in the lower left kidney appears increased in size from prior exam measuring 19 mm, with some internal high density. Recommend nonemergent renal protocol MRI for characterization on an elective basis, giving consideration to patient's advanced age. MRI should allow me considered if patient is able to tolerate breath hold  technique. 4. Small hiatal hernia. Aortic Atherosclerosis (ICD10-I70.0). Electronically Signed   By: Keith Rake M.D.   On: 11/14/2020 17:36      Assessment/Plan Principal Problem:   C. difficile diarrhea Active Problems:   Type 2 diabetes mellitus with diabetic neuropathy, unspecified (HCC)   Hyperlipidemia   HYPERTENSION, BENIGN SYSTEMIC   CKD (chronic kidney disease) stage 4, GFR 15-29 ml/min (HCC)   Stage 2 moderate COPD by GOLD classification ([place])     #1 C. difficile colitis with diarrhea: Patient will be admitted.  Aggressive hydration.  We will follow the protocol using oral vancomycin as first-line.  Monitor stool as well as response.  She is able to tolerate orally now.  She did have episode of vomiting in the ER which has now resolved.  Once patient improves will likely discharge home on oral vancomycin.  #2 diabetes: Initiate sliding scale insulin.  Monitor closely.  #3 chronic kidney disease stage IV: Continue to monitor renal function.  #4 essential hypertension: Stable.  Continue home regimen  #5 hyperlipidemia: Continue home regimen.  #6 COPD: No exacerbation.  #7 recent Covid 19 infection: Patient is outside the window for infectiousness but less than 90 days.  She is asymptomatic.   DVT prophylaxis: Heparin Code Status: DNR Family Communication: Daughter at bedside Disposition Plan: Home Consults called: None Admission status: Inpatient  Severity of Illness: The appropriate patient status for this patient is INPATIENT. Inpatient status is judged to be reasonable and necessary in order to provide the required intensity of service to ensure the patient's safety. The patient's presenting symptoms, physical exam findings, and initial radiographic and  laboratory data in the context of their chronic comorbidities is felt to place them at high risk for further clinical deterioration. Furthermore, it is not anticipated that the patient will be medically stable  for discharge from the hospital within 2 midnights of admission. The following factors support the patient status of inpatient.   " The patient's presenting symptoms include diarrhea. " The worrisome physical exam findings include mild dehydration. " The initial radiographic and laboratory data are worrisome because of positive C. difficile. " The chronic co-morbidities include osteoarthritis.   * I certify that at the point of admission it is my clinical judgment that the patient will require inpatient hospital care spanning beyond 2 midnights from the point of admission due to high intensity of service, high risk for further deterioration and high frequency of surveillance required.Barbette Merino MD [facility] Pager [phone]  If 7PM-7AM, please contact night-coverage [email or web address] Password [facility]  11/14/2020, 7:22 PM

## 2020-11-14 NOTE — ED Triage Notes (Signed)
Pt dx UTI and was given rocephin shot but since Monday has had n/v/d with any food. Pt was seen at [facility] and dx with gout and inability to keep fluids down so they sent her here.

## 2020-11-15 DIAGNOSIS — A0472 Enterocolitis due to Clostridium difficile, not specified as recurrent: Secondary | ICD-10-CM | POA: Diagnosis not present

## 2020-11-15 LAB — COMPREHENSIVE METABOLIC PANEL
ALT: 11 U/L (ref 0–44)
AST: 13 U/L — ABNORMAL LOW (ref 15–41)
Albumin: 2.5 g/dL — ABNORMAL LOW (ref 3.5–5.0)
Alkaline Phosphatase: 49 U/L (ref 38–126)
Anion gap: 9 (ref 5–15)
BUN: 36 mg/dL — ABNORMAL HIGH (ref 8–23)
CO2: 20 mmol/L — ABNORMAL LOW (ref 22–32)
Calcium: 8.3 mg/dL — ABNORMAL LOW (ref 8.9–10.3)
Chloride: 106 mmol/L (ref 98–111)
Creatinine, Ser: 1.88 mg/dL — ABNORMAL HIGH (ref 0.44–1.00)
GFR, Estimated: 26 mL/min — ABNORMAL LOW (ref >60)
Glucose, Bld: 99 mg/dL (ref 70–99)
Potassium: 3.5 mmol/L (ref 3.5–5.1)
Sodium: 135 mmol/L (ref 135–145)
Total Bilirubin: 0.7 mg/dL (ref 0.3–1.2)
Total Protein: 5.6 g/dL — ABNORMAL LOW (ref 6.5–8.1)

## 2020-11-15 LAB — HEMOGLOBIN A1C
Hgb A1c MFr Bld: 7.2 % — ABNORMAL HIGH (ref 4.8–5.6)
Mean Plasma Glucose: 159.94 mg/dL

## 2020-11-15 LAB — CBC
HCT: 31.2 % — ABNORMAL LOW (ref 36.0–46.0)
Hemoglobin: 10.3 g/dL — ABNORMAL LOW (ref 12.0–15.0)
MCH: 30 pg (ref 26.0–34.0)
MCHC: 33 g/dL (ref 30.0–36.0)
MCV: 91 fL (ref 80.0–100.0)
Platelets: 230 10*3/uL (ref 150–400)
RBC: 3.43 MIL/uL — ABNORMAL LOW (ref 3.87–5.11)
RDW: 13 % (ref 11.5–15.5)
WBC: 11.7 10*3/uL — ABNORMAL HIGH (ref 4.0–10.5)
nRBC: 0 % (ref 0.0–0.2)

## 2020-11-15 LAB — GLUCOSE, CAPILLARY
Glucose-Capillary: 108 mg/dL — ABNORMAL HIGH (ref 70–99)
Glucose-Capillary: 162 mg/dL — ABNORMAL HIGH (ref 70–99)
Glucose-Capillary: 154 mg/dL — ABNORMAL HIGH (ref 70–99)
Glucose-Capillary: 106 mg/dL — ABNORMAL HIGH (ref 70–99)

## 2020-11-15 LAB — SARS CORONAVIRUS 2 (TAT 6-24 HRS): SARS Coronavirus 2: NEGATIVE

## 2020-11-15 MED ORDER — DONEPEZIL HCL 5 MG PO TABS
5.0000 mg | ORAL_TABLET | Freq: Every day | ORAL | Status: DC
  Administered 2020-11-15 – 2020-11-20 (×6): 5 mg via ORAL
  Filled 2020-11-15 (×6): qty 1

## 2020-11-15 MED ORDER — ESCITALOPRAM OXALATE 10 MG PO TABS
20.0000 mg | ORAL_TABLET | Freq: Every day | ORAL | Status: DC
  Administered 2020-11-15 – 2020-11-21 (×7): 20 mg via ORAL
  Filled 2020-11-15 (×8): qty 2

## 2020-11-15 MED ORDER — ALBUTEROL SULFATE HFA 108 (90 BASE) MCG/ACT IN AERS
2.0000 | INHALATION_SPRAY | RESPIRATORY_TRACT | Status: DC | PRN
  Filled 2020-11-15: qty 6.7

## 2020-11-15 MED ORDER — TIOTROPIUM BROMIDE MONOHYDRATE 18 MCG IN CAPS
18.0000 ug | ORAL_CAPSULE | Freq: Every day | RESPIRATORY_TRACT | Status: DC
  Administered 2020-11-15 – 2020-11-21 (×7): 18 ug via RESPIRATORY_TRACT
  Filled 2020-11-15 (×2): qty 5

## 2020-11-15 MED ORDER — LORATADINE 10 MG PO TABS
10.0000 mg | ORAL_TABLET | Freq: Every day | ORAL | Status: DC
  Administered 2020-11-15 – 2020-11-21 (×7): 10 mg via ORAL
  Filled 2020-11-15 (×7): qty 1

## 2020-11-15 MED ORDER — MIDODRINE HCL 5 MG PO TABS
5.0000 mg | ORAL_TABLET | Freq: Every day | ORAL | Status: DC
  Administered 2020-11-15 – 2020-11-18 (×3): 5 mg via ORAL
  Filled 2020-11-15 (×4): qty 1

## 2020-11-15 NOTE — Plan of Care (Signed)

## 2020-11-15 NOTE — Progress Notes (Signed)
PROGRESS NOTE    Jill [name]  NOB:[id number] DOB: February 26, 1932 DOA: 11/14/2020 PCP: Jinny Sanders, MD    Brief Narrative:  87 y.o. female with medical history significant of chronic kidney disease stage III, diabetes, hyperlipidemia, hypertension, osteoarthritis who presented to the ER with persistent diarrhea.  Patient was apparently treated about 7 to 10 days ago for UTI.  She did get Rocephin shot.  Since then she developed some diarrhea days later.  She was given oral antibiotics that she was unable to tolerate.  Denied any urinary symptoms.  Denied any nausea or vomiting but she was initially not able to take anything by mouth.  She has abdominal cramping.  Denied any melena no bright red blood per rectum.  Patient was seen in the ER and stool studies confirm C. difficile positive.   Started on regimen of p.o. vancomycin 125 4 times daily and aggressive intravenous fluids.  Reports improvement in symptoms.   Assessment & Plan:   Principal Problem:   C. difficile diarrhea Active Problems:   Type 2 diabetes mellitus with diabetic neuropathy, unspecified (HCC)   Hyperlipidemia   HYPERTENSION, BENIGN SYSTEMIC   CKD (chronic kidney disease) stage 4, GFR 15-29 ml/min (HCC)   Stage 2 moderate COPD by GOLD classification (HCC)  Acute diarrhea secondary to C. difficile colitis In the setting of recent antibiotic use No previous history of C. difficile infection Plan: Continue p.o. vancomycin 125 4 times daily Daily probiotic Protein supplement Aggressive IV hydration Monitor BM As needed antiemetics  Diabetes Patient does not appear to be on any antidiabetic medication Hemoglobin A1c 7.2 Carb modified diet Sliding scale insulin Accu-Cheks AC at bedtime  CKD stage IV Creatinine at or near baseline IV fluids as above Avoid nephrotoxin Daily renal function  Chronic hypotension Continue midodrine 5 mg daily  Depression Dementia, Alzheimer's type Continue home  Aricept Continue home Lexapro  COPD Continue home albuterol Continue home Spiriva As needed nebs Oxygen if needed Continue Zyrtec, loratadine on substitution   DVT prophylaxis: SQ heparin Code Status: DNR Family Communication: Daughter Charna Busman [phone].  Left VM on 3/27 Disposition Plan: Status is: Inpatient  Remains inpatient appropriate because:Inpatient level of care appropriate due to severity of illness   Dispo: The patient is from: Home              Anticipated d/c is to: Home              Patient currently is not medically stable to d/c.   Difficult to place patient No  Acute diarrhea, C. difficile colitis.  Disposition plan pending     Level of care: Med-Surg  Consultants:  None Procedures:   None  Antimicrobials:   P.o. vancomycin   Subjective: Patient seen and examined.  Still some abdominal pain but improving over interval.  No other complaints  Objective: Vitals:   11/14/20 2013 11/15/20 0021 11/15/20 0400 11/15/20 0724  BP: (!) 161/66 (!) 150/59 136/65 (!) 149/55  Pulse: 86 80 94 83  Resp: 17 16 16 14   Temp: 97.9 F (36.6 C) 98.4 F (36.9 C) 98.1 F (36.7 C) 97.9 F (36.6 C)  TempSrc: Oral Oral Oral   SpO2: 100% 94% 95%   Weight:      Height:       No intake or output data in the 24 hours ending 11/15/20 0921 Filed Weights   11/14/20 1335  Weight: 72.6 kg    Examination:  General exam: Appears calm and comfortable  Respiratory system: Clear to auscultation. Respiratory effort normal. Cardiovascular system: S1 & S2 heard, RRR. No JVD, murmurs, rubs, gallops or clicks. No pedal edema. Gastrointestinal system: Soft, nondistended, tender to palpation diffusely, hyperactive bowel sounds  Central nervous system: Alert and oriented. No focal neurological deficits. Extremities: Symmetric 5 x 5 power. Skin: No rashes, lesions or ulcers Psychiatry: Judgement and insight appear normal. Mood & affect appropriate.     Data  Reviewed: I have personally reviewed following labs and imaging studies  CBC: Recent Labs  Lab 11/09/20 1806 11/14/20 1146 11/14/20 1337 11/15/20 0442  WBC 17.0* 15.3* 13.9* 11.7*  NEUTROABS 10.2* 9.3*  --   --   HGB 12.5 12.2 12.4 10.3*  HCT 37.5 37.9 37.9 31.2*  MCV 91.5 92.7 90.7 91.0  PLT 217 283 293 250   Basic Metabolic Panel: Recent Labs  Lab 11/09/20 1806 11/14/20 1146 11/14/20 1337 11/15/20 0442  NA 133* 137 136 135  K 4.2 3.8 4.1 3.5  CL 101 104 103 106  CO2 26 23 23  20*  GLUCOSE 156* 169* 220* 99  BUN 37* 37* 41* 36*  CREATININE 1.91* 2.13* 2.18* 1.88*  CALCIUM 9.1 8.9 8.9 8.3*  MG  --   --  2.3  --   PHOS  --   --  3.8  --    GFR: Estimated Creatinine Clearance: 20.5 mL/min (A) (by C-G formula based on SCr of 1.88 mg/dL (H)). Liver Function Tests: Recent Labs  Lab 11/09/20 1806 11/14/20 1146 11/14/20 1337 11/15/20 0442  AST 23 23 21  13*  ALT 14 17 16 11   ALKPHOS 74 64 63 49  BILITOT 1.1 0.6 0.7 0.7  PROT 6.8 6.4* 7.1 5.6*  ALBUMIN 3.4* 2.9* 3.3* 2.5*   Recent Labs  Lab 11/14/20 1337  LIPASE 26   No results for input(s): AMMONIA in the last 168 hours. Coagulation Profile: No results for input(s): INR, PROTIME in the last 168 hours. Cardiac Enzymes: No results for input(s): CKTOTAL, CKMB, CKMBINDEX, TROPONINI in the last 168 hours. BNP (last 3 results) No results for input(s): PROBNP in the last 8760 hours. HbA1C: Recent Labs    11/14/20 1337  HGBA1C 7.2*   CBG: Recent Labs  Lab 11/09/20 1721 11/14/20 2025 11/15/20 0724  GLUCAP 158* 115* 108*   Lipid Profile: No results for input(s): CHOL, HDL, LDLCALC, TRIG, CHOLHDL, LDLDIRECT in the last 72 hours. Thyroid Function Tests: No results for input(s): TSH, T4TOTAL, FREET4, T3FREE, THYROIDAB in the last 72 hours. Anemia Panel: No results for input(s): VITAMINB12, FOLATE, FERRITIN, TIBC, IRON, RETICCTPCT in the last 72 hours. Sepsis Labs: No results for input(s): PROCALCITON,  LATICACIDVEN in the last 168 hours.  Recent Results (from the past 240 hour(s))  Urine culture     Status: Abnormal   Collection Time: 11/09/20  6:42 PM   Specimen: Urine, Random  Result Value Ref Range Status   Specimen Description URINE, RANDOM  Final   Special Requests   Final    NONE Performed at [facility], [address] [place], [place] [address]    Culture >=100,000 COLONIES/mL ESCHERICHIA COLI (A)  Final   Report Status 11/11/2020 FINAL  Final   Organism ID, Bacteria ESCHERICHIA COLI (A)  Final      Susceptibility   Escherichia coli - MIC*    AMPICILLIN 8 SENSITIVE Sensitive     CEFAZOLIN <=4 SENSITIVE Sensitive     CEFEPIME <=0.12 SENSITIVE Sensitive     CEFTRIAXONE <=0.25 SENSITIVE Sensitive  CIPROFLOXACIN <=0.25 SENSITIVE Sensitive     GENTAMICIN <=1 SENSITIVE Sensitive     IMIPENEM <=0.25 SENSITIVE Sensitive     NITROFURANTOIN 32 SENSITIVE Sensitive     TRIMETH/SULFA <=20 SENSITIVE Sensitive     AMPICILLIN/SULBACTAM <=2 SENSITIVE Sensitive     PIP/TAZO <=4 SENSITIVE Sensitive     * >=100,000 COLONIES/mL ESCHERICHIA COLI  C Difficile Quick Screen w PCR reflex     Status: Abnormal   Collection Time: 11/14/20  4:10 PM   Specimen: STOOL  Result Value Ref Range Status   C Diff antigen POSITIVE (A) NEGATIVE Final    Comment: RESULT CALLED TO, READ BACK BY AND VERIFIED WITH: brooke Myrick @ 6195 on 11/14/2020 by caf    C Diff toxin POSITIVE (A) NEGATIVE Final    Comment: RESULT CALLED TO, READ BACK BY AND VERIFIED WITH: Brooke Myrick @ 0932 on 11/14/2020 by Ventura Sellers Diff interpretation Toxin producing C. difficile detected.  Final    Comment: Performed at [facility], [address] [place], [place] [address]  Gastrointestinal Panel by PCR , Stool     Status: None   Collection Time: 11/14/20  4:10 PM   Specimen: STOOL  Result Value Ref Range Status   Campylobacter species NOT DETECTED NOT DETECTED Final   Plesimonas shigelloides NOT DETECTED  NOT DETECTED Final   Salmonella species NOT DETECTED NOT DETECTED Final   Yersinia enterocolitica NOT DETECTED NOT DETECTED Final   Vibrio species NOT DETECTED NOT DETECTED Final   Vibrio cholerae NOT DETECTED NOT DETECTED Final   Enteroaggregative E coli (EAEC) NOT DETECTED NOT DETECTED Final   Enteropathogenic E coli (EPEC) NOT DETECTED NOT DETECTED Final   Enterotoxigenic E coli (ETEC) NOT DETECTED NOT DETECTED Final   Shiga like toxin producing E coli (STEC) NOT DETECTED NOT DETECTED Final   Shigella/Enteroinvasive E coli (EIEC) NOT DETECTED NOT DETECTED Final   Cryptosporidium NOT DETECTED NOT DETECTED Final   Cyclospora cayetanensis NOT DETECTED NOT DETECTED Final   Entamoeba histolytica NOT DETECTED NOT DETECTED Final   Giardia lamblia NOT DETECTED NOT DETECTED Final   Adenovirus F40/41 NOT DETECTED NOT DETECTED Final   Astrovirus NOT DETECTED NOT DETECTED Final   Norovirus GI/GII NOT DETECTED NOT DETECTED Final   Rotavirus A NOT DETECTED NOT DETECTED Final   Sapovirus (I, II, IV, and V) NOT DETECTED NOT DETECTED Final    Comment: Performed at [facility], [address] [place], [place] [address]  SARS CORONAVIRUS 2 (TAT 6-24 HRS) Nasopharyngeal Nasopharyngeal Swab     Status: None   Collection Time: 11/14/20  7:29 PM   Specimen: Nasopharyngeal Swab  Result Value Ref Range Status   SARS Coronavirus 2 NEGATIVE NEGATIVE Final    Comment: (NOTE) SARS-CoV-2 target nucleic acids are NOT DETECTED.  The SARS-CoV-2 RNA is generally detectable in upper and lower respiratory specimens during the acute phase of infection. Negative results do not preclude SARS-CoV-2 infection, do not rule out co-infections with other pathogens, and should not be used as the sole basis for treatment or other patient management decisions. Negative results must be combined with clinical observations, patient history, and epidemiological information. The expected result is Negative.  Fact  Sheet for Patients: SugarRoll.be  Fact Sheet for Healthcare Providers: https://www.woods-mathews.com/  This test is not yet approved or cleared by the [place] FDA and  has been authorized for detection and/or diagnosis of SARS-CoV-2 by FDA under an Emergency Use Authorization (EUA). This EUA will remain  in effect (meaning this test can be used) for the duration of the COVID-19 declaration under Se ction 564(b)(1) of the Act, 21 U.S.C. section 360bbb-3(b)(1), unless the authorization is terminated or revoked sooner.  Performed at [facility], [place] [address] [place], [place] [address]          Radiology Studies: CT ABDOMEN PELVIS WO CONTRAST  Result Date: 11/14/2020 CLINICAL DATA:  Right history of: Diverticulitis suspected Nausea, vomiting, diarrhea. Recently diagnosed with urinary tract infection. EXAM: CT ABDOMEN AND PELVIS WITHOUT CONTRAST TECHNIQUE: Multidetector CT imaging of the abdomen and pelvis was performed following the standard protocol without IV contrast. COMPARISON:  Noncontrast CT 01/27/2015, renal mass protocol MRI 07/10/2015 FINDINGS: Lower chest: Small bilateral pulmonary nodules are stable from 2016 and considered benign. No acute airspace disease. No pleural effusion. Trace pericardial effusion. Hepatobiliary: No focal hepatic abnormality on noncontrast exam. No gallstones, pericholecystic fat stranding or biliary dilatation. Pancreas: Mild parenchymal atrophy. No ductal dilatation or inflammation. Spleen: Normal in size without focal abnormality. Adrenals/Urinary Tract: Mild adrenal thickening without dominant adrenal nodule. Bilateral renal parenchymal thinning and areas of scarring. No hydronephrosis or renal calculi. No significant perinephric edema. Bilateral renal cysts as well as scattered high-density lesions that are incompletely characterized on this noncontrast exam. One of these lesions in the lower left  kidney appears increased in size from prior exam measuring 19 mm, series 2, image 35, with some internal high density. Urinary bladder is grossly negative, partially obscured by motion and streak artifact from bilateral hip arthroplasties. Stomach/Bowel: Small hiatal hernia. Stomach is decompressed. No small bowel obstruction or inflammation. Normal appendix. Small volume of colonic stool. There is colonic tortuosity. Diverticulosis from the splenic flexure distally. There is mild fat stranding adjacent to a diverticulum at the junction of the descending and sigmoid colon, series 2, image 52, suspicious for acute diverticulitis. No evidence of abscess or perforation. Suggestion of mild sigmoid colonic wall thickening diffusely. Portions of the sigmoid colon are obscured by streak artifact from bilateral hip arthroplasties. Vascular/Lymphatic: Aorto bi-iliac atherosclerosis. No aortic aneurysm. Small retroperitoneal lymph nodes are not enlarged by size criteria. No enlarged lymph nodes in the abdomen or pelvis. Reproductive: Hysterectomy.  Quiescent ovaries.  No adnexal mass. Other: No free air, ascites, or focal fluid collection. There is a tiny fat containing umbilical hernia. Musculoskeletal: Bilateral hip arthroplasties. Diffuse degenerative change in the lumbar spine. There are no acute or suspicious osseous abnormalities. IMPRESSION: 1. Mild fat stranding adjacent to a diverticulum at the junction of the descending and sigmoid colon, suspicious for acute diverticulitis. No perforation or abscess. 2. Suggestion of mild diffuse sigmoid colonic wall thickening. This is in the region of multiple diverticula. Findings may be due to mural hypertrophy, mild colitis is difficult to exclude. 3. Bilateral renal parenchymal thinning and areas of scarring. Bilateral renal cysts as well as scattered high-density lesions that are incompletely characterized on this noncontrast exam. One of these lesions in the lower left  kidney appears increased in size from prior exam measuring 19 mm, with some internal high density. Recommend nonemergent renal protocol MRI for characterization on an elective basis, giving consideration to patient's advanced age. MRI should allow me considered if patient is able to tolerate breath hold technique. 4. Small hiatal hernia. Aortic Atherosclerosis (ICD10-I70.0). Electronically Signed   By: Keith Rake M.D.   On: 11/14/2020 17:36        Scheduled Meds: . donepezil  5 mg Oral QHS  . escitalopram  20 mg  Oral Daily  . heparin  5,000 Units Subcutaneous Q8H  . insulin aspart  0-5 Units Subcutaneous QHS  . insulin aspart  0-9 Units Subcutaneous TID WC  . loratadine  10 mg Oral Daily  . midodrine  5 mg Oral Daily  . tiotropium  18 mcg Inhalation Daily  . vancomycin  125 mg Oral QID   Continuous Infusions: . lactated ringers 100 mL/hr at 11/14/20 2202     LOS: 1 day    Time spent:25 minutes    Sidney Ace, MD [facility] Pager [phone]  If 7PM-7AM, please contact night-coverage 11/15/2020, 9:21 AM

## 2020-11-15 NOTE — Plan of Care (Signed)
  Problem: Education: Goal: Knowledge of General Education information will improve Description Including pain rating scale, medication(s)/side effects and non-pharmacologic comfort measures Outcome: Progressing   Problem: Health Behavior/Discharge Planning: Goal: Ability to manage health-related needs will improve Outcome: Progressing   

## 2020-11-16 ENCOUNTER — Inpatient Hospital Stay: Payer: Medicare PPO

## 2020-11-16 DIAGNOSIS — A0472 Enterocolitis due to Clostridium difficile, not specified as recurrent: Secondary | ICD-10-CM | POA: Diagnosis not present

## 2020-11-16 LAB — MAGNESIUM: Magnesium: 1.8 mg/dL (ref 1.7–2.4)

## 2020-11-16 LAB — CBC WITH DIFFERENTIAL/PLATELET
Abs Immature Granulocytes: 0.59 10*3/uL — ABNORMAL HIGH (ref 0.00–0.07)
Basophils Absolute: 0 10*3/uL (ref 0.0–0.1)
Basophils Relative: 0 %
Eosinophils Absolute: 0.1 10*3/uL (ref 0.0–0.5)
Eosinophils Relative: 0 %
HCT: 29.4 % — ABNORMAL LOW (ref 36.0–46.0)
Hemoglobin: 9.6 g/dL — ABNORMAL LOW (ref 12.0–15.0)
Immature Granulocytes: 3 %
Lymphocytes Relative: 22 %
Lymphs Abs: 3.8 10*3/uL (ref 0.7–4.0)
MCH: 29.2 pg (ref 26.0–34.0)
MCHC: 32.7 g/dL (ref 30.0–36.0)
MCV: 89.4 fL (ref 80.0–100.0)
Monocytes Absolute: 2.6 10*3/uL — ABNORMAL HIGH (ref 0.1–1.0)
Monocytes Relative: 15 %
Neutro Abs: 10.4 10*3/uL — ABNORMAL HIGH (ref 1.7–7.7)
Neutrophils Relative %: 60 %
Platelets: 233 10*3/uL (ref 150–400)
RBC: 3.29 MIL/uL — ABNORMAL LOW (ref 3.87–5.11)
RDW: 12.9 % (ref 11.5–15.5)
WBC: 17.5 10*3/uL — ABNORMAL HIGH (ref 4.0–10.5)
nRBC: 0 % (ref 0.0–0.2)

## 2020-11-16 LAB — BASIC METABOLIC PANEL
Anion gap: 8 (ref 5–15)
BUN: 26 mg/dL — ABNORMAL HIGH (ref 8–23)
CO2: 21 mmol/L — ABNORMAL LOW (ref 22–32)
Calcium: 7.9 mg/dL — ABNORMAL LOW (ref 8.9–10.3)
Chloride: 105 mmol/L (ref 98–111)
Creatinine, Ser: 1.63 mg/dL — ABNORMAL HIGH (ref 0.44–1.00)
GFR, Estimated: 31 mL/min — ABNORMAL LOW (ref >60)
Glucose, Bld: 114 mg/dL — ABNORMAL HIGH (ref 70–99)
Potassium: 3.4 mmol/L — ABNORMAL LOW (ref 3.5–5.1)
Sodium: 134 mmol/L — ABNORMAL LOW (ref 135–145)

## 2020-11-16 LAB — GLUCOSE, CAPILLARY
Glucose-Capillary: 115 mg/dL — ABNORMAL HIGH (ref 70–99)
Glucose-Capillary: 169 mg/dL — ABNORMAL HIGH (ref 70–99)
Glucose-Capillary: 136 mg/dL — ABNORMAL HIGH (ref 70–99)
Glucose-Capillary: 104 mg/dL — ABNORMAL HIGH (ref 70–99)

## 2020-11-16 LAB — PROCALCITONIN: Procalcitonin: 0.2 ng/mL

## 2020-11-16 MED ORDER — CIPROFLOXACIN IN D5W 400 MG/200ML IV SOLN
400.0000 mg | INTRAVENOUS | Status: DC
  Administered 2020-11-16 – 2020-11-18 (×3): 400 mg via INTRAVENOUS
  Filled 2020-11-16 (×3): qty 200

## 2020-11-16 MED ORDER — MAGNESIUM SULFATE 2 GM/50ML IV SOLN
2.0000 g | Freq: Once | INTRAVENOUS | Status: AC
  Administered 2020-11-16: 2 g via INTRAVENOUS
  Filled 2020-11-16: qty 50

## 2020-11-16 MED ORDER — POTASSIUM CHLORIDE CRYS ER 20 MEQ PO TBCR
40.0000 meq | EXTENDED_RELEASE_TABLET | Freq: Once | ORAL | Status: AC
  Administered 2020-11-16: 40 meq via ORAL
  Filled 2020-11-16: qty 2

## 2020-11-16 MED ORDER — METRONIDAZOLE IN NACL 5-0.79 MG/ML-% IV SOLN
500.0000 mg | Freq: Three times a day (TID) | INTRAVENOUS | Status: AC
  Administered 2020-11-16 – 2020-11-18 (×8): 500 mg via INTRAVENOUS
  Filled 2020-11-16 (×10): qty 100

## 2020-11-16 MED ORDER — CHOLESTYRAMINE LIGHT 4 G PO PACK
4.0000 g | PACK | Freq: Two times a day (BID) | ORAL | Status: DC
  Administered 2020-11-16 – 2020-11-18 (×5): 4 g via ORAL
  Filled 2020-11-16 (×6): qty 1

## 2020-11-16 NOTE — TOC Benefit Eligibility Note (Signed)
Patient Advocate Encounter  Prior Authorization for Vancomycin 125 mg has been approved.    PA# [id number] Effective dates: 11/16/2020 through 08/21/2021  Patients co-pay is $10.00.     Lyndel Safe, [place] Patient Advocate Specialist [place] Antimicrobial Stewardship Team Direct Number: [phone]  Fax: [phone]

## 2020-11-16 NOTE — Plan of Care (Signed)

## 2020-11-16 NOTE — Progress Notes (Signed)
PROGRESS NOTE    Jill [name]  WNU:[id number] DOB: [birth year] DOA: 11/14/2020 PCP: Jinny Sanders, MD    Brief Narrative:  87 y.o. female with medical history significant of chronic kidney disease stage III, diabetes, hyperlipidemia, hypertension, osteoarthritis who presented to the ER with persistent diarrhea.  Patient was apparently treated about 7 to 10 days ago for UTI.  She did get Rocephin shot.  Since then she developed some diarrhea days later.  She was given oral antibiotics that she was unable to tolerate.  Denied any urinary symptoms.  Denied any nausea or vomiting but she was initially not able to take anything by mouth.  She has abdominal cramping.  Denied any melena no bright red blood per rectum.  Patient was seen in the ER and stool studies confirm C. difficile positive.   Started on regimen of p.o. vancomycin 125 4 times daily and aggressive intravenous fluids.  Has persistent diarrhea and increasing white blood cell count.   Assessment & Plan:   Principal Problem:   C. difficile diarrhea Active Problems:   Type 2 diabetes mellitus with diabetic neuropathy, unspecified (HCC)   Hyperlipidemia   HYPERTENSION, BENIGN SYSTEMIC   CKD (chronic kidney disease) stage 4, GFR 15-29 ml/min (HCC)   Stage 2 moderate COPD by GOLD classification (HCC)  Acute diarrhea secondary to C. difficile colitis Possible acute diverticulitis In the setting of recent antibiotic use No previous history of C. difficile infection CT on admission demonstrates acute diverticulitis in addition to colitis Treated with blood cell count Plan: Continue p.o. vancomycin 125 mg 4 times daily And Flagyl 500 mg IV 3 times daily Daily probiotic Protein supplement Aggressive IV hydration Monitor BM As needed antiemetics  Diabetes Patient does not appear to be on any antidiabetic medication Hemoglobin A1c 7.2 Carb modified diet Sliding scale insulin Accu-Cheks AC at bedtime  CKD stage  IV Creatinine at or near baseline IV fluids as above Avoid nephrotoxin Daily renal function  Chronic hypotension Continue midodrine 5 mg daily  Depression Dementia, Alzheimer's type Continue home Aricept Continue home Lexapro  COPD Continue home albuterol Continue home Spiriva As needed nebs Oxygen if needed Continue Zyrtec, loratadine on substitution   DVT prophylaxis: SQ heparin Code Status: DNR Family Communication: Wyvonne Lenz [phone] at bedside on 3/28 disposition Plan: Status is: Inpatient  Remains inpatient appropriate because:Inpatient level of care appropriate due to severity of illness   Dispo: The patient is from: Home              Anticipated d/c is to: Home              Patient currently is not medically stable to d/c.   Difficult to place patient No  Persistent diarrhea in the setting of C. difficile colitis with possible diverticulitis.  Remains on IV antibiotics.  Discharge plan pending resolution of diarrhea     Level of care: Med-Surg  Consultants:  None Procedures:   None  Antimicrobials:   P.o. vancomycin   Subjective: Patient seen and examined.  Still with significant diarrhea.  Abdominal pain persistent.  Feels weak  Objective: Vitals:   11/15/20 1501 11/15/20 1919 11/16/20 0421 11/16/20 0724  BP: (!) 161/71 (!) 151/62 (!) 139/55 136/60  Pulse: 78 83 92 90  Resp: 16 20 18 19   Temp: 98.8 F (37.1 C) 99.4 F (37.4 C) (!) 97 F (36.1 C) 99 F (37.2 C)  TempSrc: Oral   Oral  SpO2: 100% 98% 96% 98%  Weight:  Height:       No intake or output data in the 24 hours ending 11/16/20 1049 Filed Weights   11/14/20 1335  Weight: 72.6 kg    Examination:  General exam: Appears frail and weak.  Mild distress due to pain Respiratory system: Clear to auscultation. Respiratory effort normal. Cardiovascular system: S1 & S2 heard, RRR. No JVD, murmurs, rubs, gallops or clicks. No pedal edema. Gastrointestinal  system: Soft, nondistended, tender to palpation diffusely, hyperactive bowel sounds  Central nervous system: Alert and oriented. No focal neurological deficits. Extremities: Symmetric 5 x 5 power. Skin: No rashes, lesions or ulcers Psychiatry: Judgement and insight appear normal. Mood & affect appropriate.     Data Reviewed: I have personally reviewed following labs and imaging studies  CBC: Recent Labs  Lab 11/09/20 1806 11/14/20 1146 11/14/20 1337 11/15/20 0442 11/16/20 0737  WBC 17.0* 15.3* 13.9* 11.7* 17.5*  NEUTROABS 10.2* 9.3*  --   --  10.4*  HGB 12.5 12.2 12.4 10.3* 9.6*  HCT 37.5 37.9 37.9 31.2* 29.4*  MCV 91.5 92.7 90.7 91.0 89.4  PLT 217 283 293 230 532   Basic Metabolic Panel: Recent Labs  Lab 11/09/20 1806 11/14/20 1146 11/14/20 1337 11/15/20 0442 11/16/20 0737  NA 133* 137 136 135 134*  K 4.2 3.8 4.1 3.5 3.4*  CL 101 104 103 106 105  CO2 26 23 23  20* 21*  GLUCOSE 156* 169* 220* 99 114*  BUN 37* 37* 41* 36* 26*  CREATININE 1.91* 2.13* 2.18* 1.88* 1.63*  CALCIUM 9.1 8.9 8.9 8.3* 7.9*  MG  --   --  2.3  --  1.8  PHOS  --   --  3.8  --   --    GFR: Estimated Creatinine Clearance: 23.7 mL/min (A) (by C-G formula based on SCr of 1.63 mg/dL (H)). Liver Function Tests: Recent Labs  Lab 11/09/20 1806 11/14/20 1146 11/14/20 1337 11/15/20 0442  AST 23 23 21  13*  ALT 14 17 16 11   ALKPHOS 74 64 63 49  BILITOT 1.1 0.6 0.7 0.7  PROT 6.8 6.4* 7.1 5.6*  ALBUMIN 3.4* 2.9* 3.3* 2.5*   Recent Labs  Lab 11/14/20 1337  LIPASE 26   No results for input(s): AMMONIA in the last 168 hours. Coagulation Profile: No results for input(s): INR, PROTIME in the last 168 hours. Cardiac Enzymes: No results for input(s): CKTOTAL, CKMB, CKMBINDEX, TROPONINI in the last 168 hours. BNP (last 3 results) No results for input(s): PROBNP in the last 8760 hours. HbA1C: Recent Labs    11/14/20 1337  HGBA1C 7.2*   CBG: Recent Labs  Lab 11/15/20 0724 11/15/20 1130  11/15/20 1614 11/15/20 2108 11/16/20 0725  GLUCAP 108* 162* 154* 106* 115*   Lipid Profile: No results for input(s): CHOL, HDL, LDLCALC, TRIG, CHOLHDL, LDLDIRECT in the last 72 hours. Thyroid Function Tests: No results for input(s): TSH, T4TOTAL, FREET4, T3FREE, THYROIDAB in the last 72 hours. Anemia Panel: No results for input(s): VITAMINB12, FOLATE, FERRITIN, TIBC, IRON, RETICCTPCT in the last 72 hours. Sepsis Labs: No results for input(s): PROCALCITON, LATICACIDVEN in the last 168 hours.  Recent Results (from the past 240 hour(s))  Urine culture     Status: Abnormal   Collection Time: 11/09/20  6:42 PM   Specimen: Urine, Random  Result Value Ref Range Status   Specimen Description URINE, RANDOM  Final   Special Requests   Final    NONE Performed at [facility], [address] [place], [place]  [address]    Culture >=100,000 COLONIES/mL ESCHERICHIA COLI (A)  Final   Report Status 11/11/2020 FINAL  Final   Organism ID, Bacteria ESCHERICHIA COLI (A)  Final      Susceptibility   Escherichia coli - MIC*    AMPICILLIN 8 SENSITIVE Sensitive     CEFAZOLIN <=4 SENSITIVE Sensitive     CEFEPIME <=0.12 SENSITIVE Sensitive     CEFTRIAXONE <=0.25 SENSITIVE Sensitive     CIPROFLOXACIN <=0.25 SENSITIVE Sensitive     GENTAMICIN <=1 SENSITIVE Sensitive     IMIPENEM <=0.25 SENSITIVE Sensitive     NITROFURANTOIN 32 SENSITIVE Sensitive     TRIMETH/SULFA <=20 SENSITIVE Sensitive     AMPICILLIN/SULBACTAM <=2 SENSITIVE Sensitive     PIP/TAZO <=4 SENSITIVE Sensitive     * >=100,000 COLONIES/mL ESCHERICHIA COLI  C Difficile Quick Screen w PCR reflex     Status: Abnormal   Collection Time: 11/14/20  4:10 PM   Specimen: STOOL  Result Value Ref Range Status   C Diff antigen POSITIVE (A) NEGATIVE Final    Comment: RESULT CALLED TO, READ BACK BY AND VERIFIED WITH: brooke Myrick @ [phone] on 11/14/2020 by caf    C Diff toxin POSITIVE (A) NEGATIVE Final    Comment: RESULT CALLED TO, READ BACK  BY AND VERIFIED WITH: Brooke Myrick @ [phone] on 11/14/2020 by Ventura Sellers Diff interpretation Toxin producing C. difficile detected.  Final    Comment: Performed at [facility], [address] [place], [place] [address]  Gastrointestinal Panel by PCR , Stool     Status: None   Collection Time: 11/14/20  4:10 PM   Specimen: STOOL  Result Value Ref Range Status   Campylobacter species NOT DETECTED NOT DETECTED Final   Plesimonas shigelloides NOT DETECTED NOT DETECTED Final   Salmonella species NOT DETECTED NOT DETECTED Final   Yersinia enterocolitica NOT DETECTED NOT DETECTED Final   Vibrio species NOT DETECTED NOT DETECTED Final   Vibrio cholerae NOT DETECTED NOT DETECTED Final   Enteroaggregative E coli (EAEC) NOT DETECTED NOT DETECTED Final   Enteropathogenic E coli (EPEC) NOT DETECTED NOT DETECTED Final   Enterotoxigenic E coli (ETEC) NOT DETECTED NOT DETECTED Final   Shiga like toxin producing E coli (STEC) NOT DETECTED NOT DETECTED Final   Shigella/Enteroinvasive E coli (EIEC) NOT DETECTED NOT DETECTED Final   Cryptosporidium NOT DETECTED NOT DETECTED Final   Cyclospora cayetanensis NOT DETECTED NOT DETECTED Final   Entamoeba histolytica NOT DETECTED NOT DETECTED Final   Giardia lamblia NOT DETECTED NOT DETECTED Final   Adenovirus F40/41 NOT DETECTED NOT DETECTED Final   Astrovirus NOT DETECTED NOT DETECTED Final   Norovirus GI/GII NOT DETECTED NOT DETECTED Final   Rotavirus A NOT DETECTED NOT DETECTED Final   Sapovirus (I, II, IV, and V) NOT DETECTED NOT DETECTED Final    Comment: Performed at [facility], [address] [place], [place] [address]  SARS CORONAVIRUS 2 (TAT 6-24 HRS) Nasopharyngeal Nasopharyngeal Swab     Status: None   Collection Time: 11/14/20  7:29 PM   Specimen: Nasopharyngeal Swab  Result Value Ref Range Status   SARS Coronavirus 2 NEGATIVE NEGATIVE Final    Comment: (NOTE) SARS-CoV-2 target nucleic acids are NOT DETECTED.  The  SARS-CoV-2 RNA is generally detectable in upper and lower respiratory specimens during the acute phase of infection. Negative results do not preclude SARS-CoV-2 infection, do not rule out co-infections with other pathogens, and should not be used as the  sole basis for treatment or other patient management decisions. Negative results must be combined with clinical observations, patient history, and epidemiological information. The expected result is Negative.  Fact Sheet for Patients: SugarRoll.be  Fact Sheet for Healthcare Providers: https://www.woods-mathews.com/  This test is not yet approved or cleared by the [place] FDA and  has been authorized for detection and/or diagnosis of SARS-CoV-2 by FDA under an Emergency Use Authorization (EUA). This EUA will remain  in effect (meaning this test can be used) for the duration of the COVID-19 declaration under Se ction 564(b)(1) of the Act, 21 U.S.C. section 360bbb-3(b)(1), unless the authorization is terminated or revoked sooner.  Performed at [facility], [address], [place], [place] [address]          Radiology Studies: CT ABDOMEN PELVIS WO CONTRAST  Result Date: 11/14/2020 CLINICAL DATA:  Right history of: Diverticulitis suspected Nausea, vomiting, diarrhea. Recently diagnosed with urinary tract infection. EXAM: CT ABDOMEN AND PELVIS WITHOUT CONTRAST TECHNIQUE: Multidetector CT imaging of the abdomen and pelvis was performed following the standard protocol without IV contrast. COMPARISON:  Noncontrast CT 01/27/2015, renal mass protocol MRI 07/10/2015 FINDINGS: Lower chest: Small bilateral pulmonary nodules are stable from 2016 and considered benign. No acute airspace disease. No pleural effusion. Trace pericardial effusion. Hepatobiliary: No focal hepatic abnormality on noncontrast exam. No gallstones, pericholecystic fat stranding or biliary dilatation. Pancreas: Mild  parenchymal atrophy. No ductal dilatation or inflammation. Spleen: Normal in size without focal abnormality. Adrenals/Urinary Tract: Mild adrenal thickening without dominant adrenal nodule. Bilateral renal parenchymal thinning and areas of scarring. No hydronephrosis or renal calculi. No significant perinephric edema. Bilateral renal cysts as well as scattered high-density lesions that are incompletely characterized on this noncontrast exam. One of these lesions in the lower left kidney appears increased in size from prior exam measuring 19 mm, series 2, image 35, with some internal high density. Urinary bladder is grossly negative, partially obscured by motion and streak artifact from bilateral hip arthroplasties. Stomach/Bowel: Small hiatal hernia. Stomach is decompressed. No small bowel obstruction or inflammation. Normal appendix. Small volume of colonic stool. There is colonic tortuosity. Diverticulosis from the splenic flexure distally. There is mild fat stranding adjacent to a diverticulum at the junction of the descending and sigmoid colon, series 2, image 52, suspicious for acute diverticulitis. No evidence of abscess or perforation. Suggestion of mild sigmoid colonic wall thickening diffusely. Portions of the sigmoid colon are obscured by streak artifact from bilateral hip arthroplasties. Vascular/Lymphatic: Aorto bi-iliac atherosclerosis. No aortic aneurysm. Small retroperitoneal lymph nodes are not enlarged by size criteria. No enlarged lymph nodes in the abdomen or pelvis. Reproductive: Hysterectomy.  Quiescent ovaries.  No adnexal mass. Other: No free air, ascites, or focal fluid collection. There is a tiny fat containing umbilical hernia. Musculoskeletal: Bilateral hip arthroplasties. Diffuse degenerative change in the lumbar spine. There are no acute or suspicious osseous abnormalities. IMPRESSION: 1. Mild fat stranding adjacent to a diverticulum at the junction of the descending and sigmoid colon,  suspicious for acute diverticulitis. No perforation or abscess. 2. Suggestion of mild diffuse sigmoid colonic wall thickening. This is in the region of multiple diverticula. Findings may be due to mural hypertrophy, mild colitis is difficult to exclude. 3. Bilateral renal parenchymal thinning and areas of scarring. Bilateral renal cysts as well as scattered high-density lesions that are incompletely characterized on this noncontrast exam. One of these lesions in the lower left kidney appears increased in size from prior exam measuring 19 mm, with  some internal high density. Recommend nonemergent renal protocol MRI for characterization on an elective basis, giving consideration to patient's advanced age. MRI should allow me considered if patient is able to tolerate breath hold technique. 4. Small hiatal hernia. Aortic Atherosclerosis (ICD10-I70.0). Electronically Signed   By: Keith Rake M.D.   On: 11/14/2020 17:36        Scheduled Meds: . donepezil  5 mg Oral QHS  . escitalopram  20 mg Oral Daily  . heparin  5,000 Units Subcutaneous Q8H  . insulin aspart  0-5 Units Subcutaneous QHS  . insulin aspart  0-9 Units Subcutaneous TID WC  . loratadine  10 mg Oral Daily  . midodrine  5 mg Oral Daily  . tiotropium  18 mcg Inhalation Daily  . vancomycin  125 mg Oral QID   Continuous Infusions: . lactated ringers 100 mL/hr at 11/16/20 0434  . metronidazole 500 mg (11/16/20 0940)     LOS: 2 days    Time spent:25 minutes    Sidney Ace, MD [facility] Pager [phone]  If 7PM-7AM, please contact night-coverage 11/16/2020, 10:49 AM

## 2020-11-17 DIAGNOSIS — A0472 Enterocolitis due to Clostridium difficile, not specified as recurrent: Secondary | ICD-10-CM | POA: Diagnosis not present

## 2020-11-17 LAB — GLUCOSE, CAPILLARY
Glucose-Capillary: 113 mg/dL — ABNORMAL HIGH (ref 70–99)
Glucose-Capillary: 172 mg/dL — ABNORMAL HIGH (ref 70–99)
Glucose-Capillary: 138 mg/dL — ABNORMAL HIGH (ref 70–99)
Glucose-Capillary: 105 mg/dL — ABNORMAL HIGH (ref 70–99)

## 2020-11-17 LAB — BASIC METABOLIC PANEL
Anion gap: 6 (ref 5–15)
BUN: 22 mg/dL (ref 8–23)
CO2: 21 mmol/L — ABNORMAL LOW (ref 22–32)
Calcium: 8 mg/dL — ABNORMAL LOW (ref 8.9–10.3)
Chloride: 111 mmol/L (ref 98–111)
Creatinine, Ser: 1.81 mg/dL — ABNORMAL HIGH (ref 0.44–1.00)
GFR, Estimated: 27 mL/min — ABNORMAL LOW (ref >60)
Glucose, Bld: 101 mg/dL — ABNORMAL HIGH (ref 70–99)
Potassium: 3.9 mmol/L (ref 3.5–5.1)
Sodium: 138 mmol/L (ref 135–145)

## 2020-11-17 LAB — CBC WITH DIFFERENTIAL/PLATELET
Abs Immature Granulocytes: 0.38 10*3/uL — ABNORMAL HIGH (ref 0.00–0.07)
Basophils Absolute: 0 10*3/uL (ref 0.0–0.1)
Basophils Relative: 0 %
Eosinophils Absolute: 0.1 10*3/uL (ref 0.0–0.5)
Eosinophils Relative: 1 %
HCT: 28.2 % — ABNORMAL LOW (ref 36.0–46.0)
Hemoglobin: 9.2 g/dL — ABNORMAL LOW (ref 12.0–15.0)
Immature Granulocytes: 3 %
Lymphocytes Relative: 30 %
Lymphs Abs: 3.8 10*3/uL (ref 0.7–4.0)
MCH: 29.7 pg (ref 26.0–34.0)
MCHC: 32.6 g/dL (ref 30.0–36.0)
MCV: 91 fL (ref 80.0–100.0)
Monocytes Absolute: 1.6 10*3/uL — ABNORMAL HIGH (ref 0.1–1.0)
Monocytes Relative: 12 %
Neutro Abs: 6.7 10*3/uL (ref 1.7–7.7)
Neutrophils Relative %: 54 %
Platelets: 240 10*3/uL (ref 150–400)
RBC: 3.1 MIL/uL — ABNORMAL LOW (ref 3.87–5.11)
RDW: 13.1 % (ref 11.5–15.5)
WBC: 12.6 10*3/uL — ABNORMAL HIGH (ref 4.0–10.5)
nRBC: 0 % (ref 0.0–0.2)

## 2020-11-17 LAB — MAGNESIUM: Magnesium: 2.2 mg/dL (ref 1.7–2.4)

## 2020-11-17 NOTE — Progress Notes (Signed)
PROGRESS NOTE    Jill [name]  HCW:[id number] DOB: 1932/04/06 DOA: 11/14/2020 PCP: Jinny Sanders, MD    Brief Narrative:  87 y.o. female with medical history significant of chronic kidney disease stage III, diabetes, hyperlipidemia, hypertension, osteoarthritis who presented to the ER with persistent diarrhea.  Patient was apparently treated about 7 to 10 days ago for UTI.  She did get Rocephin shot.  Since then she developed some diarrhea days later.  She was given oral antibiotics that she was unable to tolerate.  Denied any urinary symptoms.  Denied any nausea or vomiting but she was initially not able to take anything by mouth.  She has abdominal cramping.  Denied any melena no bright red blood per rectum.  Patient was seen in the ER and stool studies confirm C. difficile positive.   Started on regimen of p.o. vancomycin 125 4 times daily and aggressive intravenous fluids.  In the setting of persistent diarrhea, increasing white blood cell count, worsening confusion I added ciprofloxacin and metronidazole for concomitant treatment of acute diverticulitis, increased rate of IV fluids, added p.o. Questran for control of diarrhea  Patient seems to be responding to this regimen.  White count now decreasing.  Mental status at baseline  Assessment & Plan:   Principal Problem:   C. difficile diarrhea Active Problems:   Type 2 diabetes mellitus with diabetic neuropathy, unspecified (HCC)   Hyperlipidemia   HYPERTENSION, BENIGN SYSTEMIC   CKD (chronic kidney disease) stage 4, GFR 15-29 ml/min (HCC)   Stage 2 moderate COPD by GOLD classification (HCC)  Acute diarrhea secondary to C. difficile colitis Concomitant acute diverticulitis In the setting of recent antibiotic use No previous history of C. difficile infection CT on admission demonstrates acute diverticulitis in addition to colitis Has had persistent diarrhea altered mentation Mental status improved after addition of Cipro  Questran Plan: Continue p.o. vancomycin 125 mg 4 times daily Continue Flagyl 500 IV 3 times daily Continue Cipro 400 IV twice daily Continue Questran 4 g p.o. twice daily Daily probiotic Protein supplement Aggressive IV hydration Monitor BM As needed antiemetics  Diabetes Patient does not appear to be on any antidiabetic medication Hemoglobin A1c 7.2 Carb modified diet Sliding scale insulin Accu-Cheks AC at bedtime  CKD stage IV Creatinine at or near baseline IV fluids as above Avoid nephrotoxin Daily renal function  Chronic hypotension Continue midodrine 5 mg daily  Depression Dementia, Alzheimer's type Continue home Aricept Continue home Lexapro  COPD Continue home albuterol Continue home Spiriva As needed nebs Oxygen if needed Continue Zyrtec, loratadine on substitution   DVT prophylaxis: SQ heparin Code Status: DNR Family Communication:Granddaughter [name] [phone] on 3/29 disposition Plan: Status is: Inpatient  Remains inpatient appropriate because:Inpatient level of care appropriate due to severity of illness   Dispo: The patient is from: Home              Anticipated d/c is to: Home              Patient currently is not medically stable to d/c.   Difficult to place patient No  Persistent diarrhea in the setting of C. difficile colitis with possible diverticulitis.  Remains on IV antibiotics.  Discharge plan pending resolution of diarrhea     Level of care: Med-Surg  Consultants:  None Procedures:   None  Antimicrobials:   P.o. vancomycin   Subjective: Patient seen and examined.  Still with significant diarrhea.  Abdominal pain persistent.  Feels weak  Objective: Vitals:  11/16/20 1512 11/16/20 2041 11/17/20 0056 11/17/20 0812  BP: 139/60 (!) 133/58 (!) 132/51 132/63  Pulse: 81 85 81 83  Resp: 19 18 17 19   Temp: 98.5 F (36.9 C) 98.6 F (37 C) 99.4 F (37.4 C) 97.7 F (36.5 C)  TempSrc:    Oral  SpO2: 97% 94% 92% 93%   Weight:      Height:        Intake/Output Summary (Last 24 hours) at 11/17/2020 1019 Last data filed at 11/17/2020 0101 Gross per 24 hour  Intake 200 ml  Output --  Net 200 ml   Filed Weights   11/14/20 1335  Weight: 72.6 kg    Examination: General: No apparent distress, patient appears well HEENT: Normocephalic, atraumatic Neck, supple, trachea midline, no tenderness Heart: Regular rate and rhythm, S1/S2 normal, no murmurs Lungs: Clear to auscultation bilaterally, no adventitious sounds, normal work of breathing Abdomen: Soft, nondistended, mild tender to palpation, hyperactive bowel sounds Extremities: Normal, atraumatic, no clubbing or cyanosis, normal muscle tone Skin: No rashes or lesions, normal color Neurologic: Cranial nerves grossly intact, sensation intact, alert and oriented x3 Psychiatric: Normal affect   Data Reviewed: I have personally reviewed following labs and imaging studies  CBC: Recent Labs  Lab 11/14/20 1146 11/14/20 1337 11/15/20 0442 11/16/20 0737 11/17/20 0633  WBC 15.3* 13.9* 11.7* 17.5* 12.6*  NEUTROABS 9.3*  --   --  10.4* 6.7  HGB 12.2 12.4 10.3* 9.6* 9.2*  HCT 37.9 37.9 31.2* 29.4* 28.2*  MCV 92.7 90.7 91.0 89.4 91.0  PLT 283 293 230 233 160   Basic Metabolic Panel: Recent Labs  Lab 11/14/20 1146 11/14/20 1337 11/15/20 0442 11/16/20 0737 11/17/20 0633  NA 137 136 135 134* 138  K 3.8 4.1 3.5 3.4* 3.9  CL 104 103 106 105 111  CO2 23 23 20* 21* 21*  GLUCOSE 169* 220* 99 114* 101*  BUN 37* 41* 36* 26* 22  CREATININE 2.13* 2.18* 1.88* 1.63* 1.81*  CALCIUM 8.9 8.9 8.3* 7.9* 8.0*  MG  --  2.3  --  1.8 2.2  PHOS  --  3.8  --   --   --    GFR: Estimated Creatinine Clearance: 21.3 mL/min (A) (by C-G formula based on SCr of 1.81 mg/dL (H)). Liver Function Tests: Recent Labs  Lab 11/14/20 1146 11/14/20 1337 11/15/20 0442  AST 23 21 13*  ALT 17 16 11   ALKPHOS 64 63 49  BILITOT 0.6 0.7 0.7  PROT 6.4* 7.1 5.6*  ALBUMIN 2.9*  3.3* 2.5*   Recent Labs  Lab 11/14/20 1337  LIPASE 26   No results for input(s): AMMONIA in the last 168 hours. Coagulation Profile: No results for input(s): INR, PROTIME in the last 168 hours. Cardiac Enzymes: No results for input(s): CKTOTAL, CKMB, CKMBINDEX, TROPONINI in the last 168 hours. BNP (last 3 results) No results for input(s): PROBNP in the last 8760 hours. HbA1C: Recent Labs    11/14/20 1337  HGBA1C 7.2*   CBG: Recent Labs  Lab 11/16/20 0725 11/16/20 1136 11/16/20 1659 11/16/20 2105 11/17/20 0810  GLUCAP 115* 169* 136* 104* 113*   Lipid Profile: No results for input(s): CHOL, HDL, LDLCALC, TRIG, CHOLHDL, LDLDIRECT in the last 72 hours. Thyroid Function Tests: No results for input(s): TSH, T4TOTAL, FREET4, T3FREE, THYROIDAB in the last 72 hours. Anemia Panel: No results for input(s): VITAMINB12, FOLATE, FERRITIN, TIBC, IRON, RETICCTPCT in the last 72 hours. Sepsis Labs: Recent Labs  Lab 11/16/20 0737  PROCALCITON 0.20  Recent Results (from the past 240 hour(s))  Urine culture     Status: Abnormal   Collection Time: 11/09/20  6:42 PM   Specimen: Urine, Random  Result Value Ref Range Status   Specimen Description URINE, RANDOM  Final   Special Requests   Final    NONE Performed at [facility], [address], [place], [place] [address]    Culture >=100,000 COLONIES/mL ESCHERICHIA COLI (A)  Final   Report Status 11/11/2020 FINAL  Final   Organism ID, Bacteria ESCHERICHIA COLI (A)  Final      Susceptibility   Escherichia coli - MIC*    AMPICILLIN 8 SENSITIVE Sensitive     CEFAZOLIN <=4 SENSITIVE Sensitive     CEFEPIME <=0.12 SENSITIVE Sensitive     CEFTRIAXONE <=0.25 SENSITIVE Sensitive     CIPROFLOXACIN <=0.25 SENSITIVE Sensitive     GENTAMICIN <=1 SENSITIVE Sensitive     IMIPENEM <=0.25 SENSITIVE Sensitive     NITROFURANTOIN 32 SENSITIVE Sensitive     TRIMETH/SULFA <=20 SENSITIVE Sensitive     AMPICILLIN/SULBACTAM <=2 SENSITIVE  Sensitive     PIP/TAZO <=4 SENSITIVE Sensitive     * >=100,000 COLONIES/mL ESCHERICHIA COLI  C Difficile Quick Screen w PCR reflex     Status: Abnormal   Collection Time: 11/14/20  4:10 PM   Specimen: STOOL  Result Value Ref Range Status   C Diff antigen POSITIVE (A) NEGATIVE Final    Comment: RESULT CALLED TO, READ BACK BY AND VERIFIED WITH: brooke Myrick @ 1027 on 11/14/2020 by caf    C Diff toxin POSITIVE (A) NEGATIVE Final    Comment: RESULT CALLED TO, READ BACK BY AND VERIFIED WITH: Brooke Myrick @ 2536 on 11/14/2020 by Ventura Sellers Diff interpretation Toxin producing C. difficile detected.  Final    Comment: Performed at [facility], [address] [place], [place] [address]  Gastrointestinal Panel by PCR , Stool     Status: None   Collection Time: 11/14/20  4:10 PM   Specimen: STOOL  Result Value Ref Range Status   Campylobacter species NOT DETECTED NOT DETECTED Final   Plesimonas shigelloides NOT DETECTED NOT DETECTED Final   Salmonella species NOT DETECTED NOT DETECTED Final   Yersinia enterocolitica NOT DETECTED NOT DETECTED Final   Vibrio species NOT DETECTED NOT DETECTED Final   Vibrio cholerae NOT DETECTED NOT DETECTED Final   Enteroaggregative E coli (EAEC) NOT DETECTED NOT DETECTED Final   Enteropathogenic E coli (EPEC) NOT DETECTED NOT DETECTED Final   Enterotoxigenic E coli (ETEC) NOT DETECTED NOT DETECTED Final   Shiga like toxin producing E coli (STEC) NOT DETECTED NOT DETECTED Final   Shigella/Enteroinvasive E coli (EIEC) NOT DETECTED NOT DETECTED Final   Cryptosporidium NOT DETECTED NOT DETECTED Final   Cyclospora cayetanensis NOT DETECTED NOT DETECTED Final   Entamoeba histolytica NOT DETECTED NOT DETECTED Final   Giardia lamblia NOT DETECTED NOT DETECTED Final   Adenovirus F40/41 NOT DETECTED NOT DETECTED Final   Astrovirus NOT DETECTED NOT DETECTED Final   Norovirus GI/GII NOT DETECTED NOT DETECTED Final   Rotavirus A NOT DETECTED NOT DETECTED  Final   Sapovirus (I, II, IV, and V) NOT DETECTED NOT DETECTED Final    Comment: Performed at [facility], [address] [place], [place] [address]  SARS CORONAVIRUS 2 (TAT 6-24 HRS) Nasopharyngeal Nasopharyngeal Swab     Status: None   Collection Time: 11/14/20  7:29 PM   Specimen: Nasopharyngeal Swab  Result Value Ref Range Status   SARS Coronavirus 2 NEGATIVE NEGATIVE Final    Comment: (NOTE) SARS-CoV-2 target nucleic acids are NOT DETECTED.  The SARS-CoV-2 RNA is generally detectable in upper and lower respiratory specimens during the acute phase of infection. Negative results do not preclude SARS-CoV-2 infection, do not rule out co-infections with other pathogens, and should not be used as the sole basis for treatment or other patient management decisions. Negative results must be combined with clinical observations, patient history, and epidemiological information. The expected result is Negative.  Fact Sheet for Patients: SugarRoll.be  Fact Sheet for Healthcare Providers: https://www.woods-mathews.com/  This test is not yet approved or cleared by the [place] FDA and  has been authorized for detection and/or diagnosis of SARS-CoV-2 by FDA under an Emergency Use Authorization (EUA). This EUA will remain  in effect (meaning this test can be used) for the duration of the COVID-19 declaration under Se ction 564(b)(1) of the Act, 21 U.S.C. section 360bbb-3(b)(1), unless the authorization is terminated or revoked sooner.  Performed at [facility], [place] [address], [place], [place] [address]          Radiology Studies: DG Abd 1 View  Result Date: 11/16/2020 CLINICAL DATA:  Diarrhea. EXAM: ABDOMEN - 1 VIEW COMPARISON:  CT abdomen and pelvis 11/14/2020. Chest and two views abdomen 01/27/2015. FINDINGS: The bowel gas pattern is normal. No radio-opaque calculi or other significant radiographic abnormality are  seen. Bilateral hip replacements and multilevel lumbar spondylosis are noted. IMPRESSION: No acute finding. Electronically Signed   By: Inge Rise M.D.   On: 11/16/2020 15:45        Scheduled Meds: . cholestyramine light  4 g Oral BID  . donepezil  5 mg Oral QHS  . escitalopram  20 mg Oral Daily  . heparin  5,000 Units Subcutaneous Q8H  . insulin aspart  0-5 Units Subcutaneous QHS  . insulin aspart  0-9 Units Subcutaneous TID WC  . loratadine  10 mg Oral Daily  . midodrine  5 mg Oral Daily  . tiotropium  18 mcg Inhalation Daily  . vancomycin  125 mg Oral QID   Continuous Infusions: . ciprofloxacin Stopped (11/16/20 1635)  . lactated ringers 100 mL/hr at 11/17/20 0101  . metronidazole 500 mg (11/16/20 2351)     LOS: 3 days    Time spent:25 minutes    Sidney Ace, MD [facility] Pager [phone]  If 7PM-7AM, please contact night-coverage 11/17/2020, 10:19 AM

## 2020-11-18 DIAGNOSIS — A0472 Enterocolitis due to Clostridium difficile, not specified as recurrent: Secondary | ICD-10-CM | POA: Diagnosis not present

## 2020-11-18 LAB — BASIC METABOLIC PANEL
Anion gap: 5 (ref 5–15)
BUN: 20 mg/dL (ref 8–23)
CO2: 21 mmol/L — ABNORMAL LOW (ref 22–32)
Calcium: 7.7 mg/dL — ABNORMAL LOW (ref 8.9–10.3)
Chloride: 112 mmol/L — ABNORMAL HIGH (ref 98–111)
Creatinine, Ser: 1.55 mg/dL — ABNORMAL HIGH (ref 0.44–1.00)
GFR, Estimated: 32 mL/min — ABNORMAL LOW (ref >60)
Glucose, Bld: 158 mg/dL — ABNORMAL HIGH (ref 70–99)
Potassium: 3.8 mmol/L (ref 3.5–5.1)
Sodium: 138 mmol/L (ref 135–145)

## 2020-11-18 LAB — CBC WITH DIFFERENTIAL/PLATELET
Abs Immature Granulocytes: 0.16 10*3/uL — ABNORMAL HIGH (ref 0.00–0.07)
Basophils Absolute: 0 10*3/uL (ref 0.0–0.1)
Basophils Relative: 0 %
Eosinophils Absolute: 0.1 10*3/uL (ref 0.0–0.5)
Eosinophils Relative: 1 %
HCT: 27.9 % — ABNORMAL LOW (ref 36.0–46.0)
Hemoglobin: 9.3 g/dL — ABNORMAL LOW (ref 12.0–15.0)
Immature Granulocytes: 2 %
Lymphocytes Relative: 41 %
Lymphs Abs: 4.3 10*3/uL — ABNORMAL HIGH (ref 0.7–4.0)
MCH: 29.8 pg (ref 26.0–34.0)
MCHC: 33.3 g/dL (ref 30.0–36.0)
MCV: 89.4 fL (ref 80.0–100.0)
Monocytes Absolute: 1.3 10*3/uL — ABNORMAL HIGH (ref 0.1–1.0)
Monocytes Relative: 13 %
Neutro Abs: 4.4 10*3/uL (ref 1.7–7.7)
Neutrophils Relative %: 43 %
Platelets: 265 10*3/uL (ref 150–400)
RBC: 3.12 MIL/uL — ABNORMAL LOW (ref 3.87–5.11)
RDW: 13.1 % (ref 11.5–15.5)
WBC: 10.3 10*3/uL (ref 4.0–10.5)
nRBC: 0 % (ref 0.0–0.2)

## 2020-11-18 LAB — MAGNESIUM: Magnesium: 2 mg/dL (ref 1.7–2.4)

## 2020-11-18 LAB — GLUCOSE, CAPILLARY
Glucose-Capillary: 125 mg/dL — ABNORMAL HIGH (ref 70–99)
Glucose-Capillary: 180 mg/dL — ABNORMAL HIGH (ref 70–99)
Glucose-Capillary: 141 mg/dL — ABNORMAL HIGH (ref 70–99)
Glucose-Capillary: 106 mg/dL — ABNORMAL HIGH (ref 70–99)

## 2020-11-18 MED ORDER — MIDODRINE HCL 5 MG PO TABS: 5.0000 mg | ORAL_TABLET | Freq: Three times a day (TID) | ORAL | Status: DC | PRN

## 2020-11-18 MED ORDER — PIPERACILLIN-TAZOBACTAM 3.375 G IVPB
3.3750 g | Freq: Three times a day (TID) | INTRAVENOUS | Status: DC
  Administered 2020-11-19 – 2020-11-20 (×4): 3.375 g via INTRAVENOUS
  Filled 2020-11-18 (×3): qty 50

## 2020-11-18 NOTE — Plan of Care (Signed)
Alert and oriented x 3, anxious, expressed of being alone in room, reassured presence of staff, kept door open as requested. No c/o pain, calm and cooperative at this time.

## 2020-11-18 NOTE — Care Management Important Message (Signed)
Important Message  Patient Details  Name: Jill [name] MRN: [id number] Date of Birth: 08-23-1931   Medicare Important Message Given:  Yes  Patient is in an isolation room and RN CM needed to follow-up with patient and asked bedside RN to deliver the Important Message from Medicare.   [name] A Sloka Volante 11/18/2020, 2:50 PM

## 2020-11-18 NOTE — Progress Notes (Signed)
[facility] Progress Note  Patient: Jill [name]    YJE:[id number]  DOA: 11/14/2020     Date of Service: the patient was seen and examined on 11/18/2020  Chief Complaint  Patient presents with  . Diarrhea   Brief hospital course: 87 y.o.femalewith medical history significant ofchronic kidney disease stage III, diabetes, hyperlipidemia, hypertension, osteoarthritis who presented to the ER with persistent diarrhea. Patient was apparently treated about 7 to 10 days ago for UTI. She did get Rocephin shot. Since then she developed some diarrhea days later. She was given oral antibiotics that she was unable to tolerate. Denied any urinary symptoms. Denied any nausea or vomiting but she was initially not able to take anything by mouth. She has abdominal cramping. Denied any melena no bright red blood per rectum. Patient was seen in the ER and stool studies confirm C. difficile positive.   Started on regimen of p.o. vancomycin 125 4 times daily and aggressive intravenous fluids.  In the setting of persistent diarrhea, increasing white blood cell count, worsening confusion I added ciprofloxacin and metronidazole for concomitant treatment of acute diverticulitis, increased rate of IV fluids, added p.o. Questran for control of diarrhea  Patient seems to be responding to this regimen.  White count now decreasing.  Mental status at baseline   Assessment and Plan: Principal Problem:   C. difficile diarrhea Active Problems:   Type 2 diabetes mellitus with diabetic neuropathy, unspecified (HCC)   Hyperlipidemia   HYPERTENSION, BENIGN SYSTEMIC   CKD (chronic kidney disease) stage 4, GFR 15-29 ml/min (HCC)   Stage 2 moderate COPD by GOLD classification (HCC)  Acute diarrhea secondary to C. difficile colitis Concomitant acute diverticulitis In the setting of recent antibiotic use No previous history of C. difficile infection CT on admission demonstrates acute diverticulitis in  addition to colitis Has had persistent diarrhea altered mentation Mental status improved after addition of Cipro Questran Plan: Continue p.o. vancomycin 125 mg 4 times daily Continue Flagyl 500 IV 3 times daily Continue Cipro 400 IV twice daily Continue Questran 4 g p.o. twice daily Daily probiotic Protein supplement IV fluid Ringer lactate 75 mill per hour Monitor BM As needed antiemetics   Diabetes Patient does not appear to be on any antidiabetic medication Hemoglobin A1c 7.2 Carb modified diet Sliding scale insulin Accu-Cheks AC at bedtime  CKD stage IV Creatinine at or near baseline IV fluids as above Avoid nephrotoxin Daily renal function  Chronic hypotension Continue midodrine 5 mg po TID prn if SBP <100 mmHg   Depression Dementia, Alzheimer's type Continue home Aricept Continue home Lexapro  COPD Continue home albuterol Continue home Spiriva As needed nebs Oxygen if needed Continue Zyrtec, loratadine on substitution  Left kidney lesion, incidental finding on CT scan.  Recommended MRI with breath-hold technique as an outpatient. Follow-up with PCP for further management   Body mass index is 28.34 kg/m.  Interventions:        Diet: Diabetic diet DVT Prophylaxis: Subcutaneous Heparin    Advance goals of care discussion: DNR  Family Communication: family was present at bedside, at the time of interview.  The pt provided permission to discuss medical plan with the family. Opportunity was given to ask question and all questions were answered satisfactorily.   Disposition:  Pt is from home, admitted with C. difficile colitis with possible diverticulitis, still has diarrhea, which precludes a safe discharge. Discharge to home, when diarrhea resolves, may be in 1 to 2 days.  Subjective: No any issues overnight, yesterday  patient had less than 5 bowel movements, diarrhea and today she had one episode of diarrhea so far, denied any nausea and no  vomiting.  Patient has decreased appetite. Patient denied any abdominal pain, no any other complaints.  Physical Exam: General:  Alert, NAD.  Appear in no distress, affect appropriate Eyes: PERRLA ENT: Oral Mucosa Clear, moist  Neck: no JVD,  Cardiovascular: S1 and S2 Present, no Murmur,  Respiratory: good respiratory effort, Bilateral Air entry equal and Decreased, no Crackles, no wheezes Abdomen: Bowel Sound present, Soft and no tenderness,  Skin: no rashes Extremities: no Pedal edema, no calf tenderness Neurologic: without any new focal findings Gait not checked due to patient safety concerns  Vitals:   11/18/20 0013 11/18/20 0449 11/18/20 0722 11/18/20 1210  BP: (!) 151/65 (!) 149/61 (!) 138/102 (!) 151/57  Pulse: 79 74 83 70  Resp: 19 16 18 18   Temp: 98.2 F (36.8 C) 98 F (36.7 C) 98 F (36.7 C) 98.1 F (36.7 C)  TempSrc:      SpO2: 95% 95% 95% 96%  Weight:      Height:        Intake/Output Summary (Last 24 hours) at 11/18/2020 1344 Last data filed at 11/18/2020 8502 Gross per 24 hour  Intake 1020 ml  Output --  Net 1020 ml   Filed Weights   11/14/20 1335  Weight: 72.6 kg    Data Reviewed: I have personally reviewed and interpreted daily labs, tele strips, imagings as discussed above. I reviewed all nursing notes, pharmacy notes, vitals, pertinent old records I have discussed plan of care as described above with RN and patient/family.  CBC: Recent Labs  Lab 11/14/20 1146 11/14/20 1337 11/15/20 0442 11/16/20 0737 11/17/20 0633 11/18/20 0349  WBC 15.3* 13.9* 11.7* 17.5* 12.6* 10.3  NEUTROABS 9.3*  --   --  10.4* 6.7 4.4  HGB 12.2 12.4 10.3* 9.6* 9.2* 9.3*  HCT 37.9 37.9 31.2* 29.4* 28.2* 27.9*  MCV 92.7 90.7 91.0 89.4 91.0 89.4  PLT 283 293 230 233 240 774   Basic Metabolic Panel: Recent Labs  Lab 11/14/20 1337 11/15/20 0442 11/16/20 0737 11/17/20 0633 11/18/20 0349  NA 136 135 134* 138 138  K 4.1 3.5 3.4* 3.9 3.8  CL 103 106 105 111 112*   CO2 23 20* 21* 21* 21*  GLUCOSE 220* 99 114* 101* 158*  BUN 41* 36* 26* 22 20  CREATININE 2.18* 1.88* 1.63* 1.81* 1.55*  CALCIUM 8.9 8.3* 7.9* 8.0* 7.7*  MG 2.3  --  1.8 2.2 2.0  PHOS 3.8  --   --   --   --     Studies: No results found.  Scheduled Meds: . cholestyramine light  4 g Oral BID  . donepezil  5 mg Oral QHS  . escitalopram  20 mg Oral Daily  . heparin  5,000 Units Subcutaneous Q8H  . insulin aspart  0-5 Units Subcutaneous QHS  . insulin aspart  0-9 Units Subcutaneous TID WC  . loratadine  10 mg Oral Daily  . midodrine  5 mg Oral Daily  . tiotropium  18 mcg Inhalation Daily  . vancomycin  125 mg Oral QID   Continuous Infusions: . ciprofloxacin 400 mg (11/17/20 1547)  . lactated ringers 125 mL/hr at 11/18/20 1004  . metronidazole 500 mg (11/18/20 0831)   PRN Meds: albuterol, ondansetron **OR** ondansetron (ZOFRAN) IV  Time spent: 35 minutes  Author: Val Riles. MD [facility] 11/18/2020 1:44 PM  To reach  On-call, see care teams to locate the attending and reach out to them via [email or web address]. If 7PM-7AM, please contact night-coverage If you still have difficulty reaching the attending provider, please page the [facility] (Director on Call) for [facility] on amion for assistance.

## 2020-11-19 DIAGNOSIS — A0472 Enterocolitis due to Clostridium difficile, not specified as recurrent: Secondary | ICD-10-CM | POA: Diagnosis not present

## 2020-11-19 LAB — BASIC METABOLIC PANEL
Anion gap: 6 (ref 5–15)
BUN: 14 mg/dL (ref 8–23)
CO2: 22 mmol/L (ref 22–32)
Calcium: 8.1 mg/dL — ABNORMAL LOW (ref 8.9–10.3)
Chloride: 112 mmol/L — ABNORMAL HIGH (ref 98–111)
Creatinine, Ser: 1.52 mg/dL — ABNORMAL HIGH (ref 0.44–1.00)
GFR, Estimated: 33 mL/min — ABNORMAL LOW (ref >60)
Glucose, Bld: 109 mg/dL — ABNORMAL HIGH (ref 70–99)
Potassium: 4 mmol/L (ref 3.5–5.1)
Sodium: 140 mmol/L (ref 135–145)

## 2020-11-19 LAB — CBC WITH DIFFERENTIAL/PLATELET
Abs Immature Granulocytes: 0.16 10*3/uL — ABNORMAL HIGH (ref 0.00–0.07)
Basophils Absolute: 0 10*3/uL (ref 0.0–0.1)
Basophils Relative: 0 %
Eosinophils Absolute: 0.1 10*3/uL (ref 0.0–0.5)
Eosinophils Relative: 1 %
HCT: 30.8 % — ABNORMAL LOW (ref 36.0–46.0)
Hemoglobin: 10 g/dL — ABNORMAL LOW (ref 12.0–15.0)
Immature Granulocytes: 2 %
Lymphocytes Relative: 44 %
Lymphs Abs: 4.5 10*3/uL — ABNORMAL HIGH (ref 0.7–4.0)
MCH: 29.5 pg (ref 26.0–34.0)
MCHC: 32.5 g/dL (ref 30.0–36.0)
MCV: 90.9 fL (ref 80.0–100.0)
Monocytes Absolute: 0.9 10*3/uL (ref 0.1–1.0)
Monocytes Relative: 9 %
Neutro Abs: 4.4 10*3/uL (ref 1.7–7.7)
Neutrophils Relative %: 44 %
Platelets: 307 10*3/uL (ref 150–400)
RBC: 3.39 MIL/uL — ABNORMAL LOW (ref 3.87–5.11)
RDW: 13.4 % (ref 11.5–15.5)
WBC: 10.1 10*3/uL (ref 4.0–10.5)
nRBC: 0 % (ref 0.0–0.2)

## 2020-11-19 LAB — GLUCOSE, CAPILLARY
Glucose-Capillary: 102 mg/dL — ABNORMAL HIGH (ref 70–99)
Glucose-Capillary: 154 mg/dL — ABNORMAL HIGH (ref 70–99)
Glucose-Capillary: 75 mg/dL (ref 70–99)
Glucose-Capillary: 121 mg/dL — ABNORMAL HIGH (ref 70–99)

## 2020-11-19 LAB — MAGNESIUM: Magnesium: 1.9 mg/dL (ref 1.7–2.4)

## 2020-11-19 LAB — PHOSPHORUS: Phosphorus: 4.1 mg/dL (ref 2.5–4.6)

## 2020-11-19 MED ORDER — HYDRALAZINE HCL 20 MG/ML IJ SOLN
10.0000 mg | Freq: Four times a day (QID) | INTRAMUSCULAR | Status: DC | PRN
  Administered 2020-11-21: 10 mg via INTRAVENOUS
  Filled 2020-11-19: qty 1

## 2020-11-19 MED ORDER — AMLODIPINE BESYLATE 5 MG PO TABS
5.0000 mg | ORAL_TABLET | Freq: Every day | ORAL | Status: DC
Start: 2020-11-19 — End: 2020-11-21
  Administered 2020-11-19 – 2020-11-20 (×2): 5 mg via ORAL
  Filled 2020-11-19 (×2): qty 1

## 2020-11-19 NOTE — Progress Notes (Signed)
[facility] Progress Note  Patient: Jill [name]    ZSW:[id number]  DOA: 11/14/2020     Date of Service: the patient was seen and examined on 11/19/2020  Chief Complaint  Patient presents with  . Diarrhea   Brief hospital course: 87 y.o.femalewith medical history significant ofchronic kidney disease stage III, diabetes, hyperlipidemia, hypertension, osteoarthritis who presented to the ER with persistent diarrhea. Patient was apparently treated about 7 to 10 days ago for UTI. She did get Rocephin shot. Since then she developed some diarrhea days later. She was given oral antibiotics that she was unable to tolerate. Denied any urinary symptoms. Denied any nausea or vomiting but she was initially not able to take anything by mouth. She has abdominal cramping. Denied any melena no bright red blood per rectum. Patient was seen in the ER and stool studies confirm C. difficile positive.   Started on regimen of p.o. vancomycin 125 4 times daily and aggressive intravenous fluids.  In the setting of persistent diarrhea, increasing white blood cell count, worsening confusion I added ciprofloxacin and metronidazole for concomitant treatment of acute diverticulitis, increased rate of IV fluids, added p.o. Questran for control of diarrhea  Patient seems to be responding to this regimen.  White count now decreasing.  Mental status at baseline   Assessment and Plan: Principal Problem:   C. difficile diarrhea Active Problems:   Type 2 diabetes mellitus with diabetic neuropathy, unspecified (HCC)   Hyperlipidemia   HYPERTENSION, BENIGN SYSTEMIC   CKD (chronic kidney disease) stage 4, GFR 15-29 ml/min (HCC)   Stage 2 moderate COPD by GOLD classification (HCC)  Acute diarrhea secondary to C. difficile colitis Concomitant acute diverticulitis In the setting of recent antibiotic use No previous history of C. difficile infection CT on admission demonstrates acute diverticulitis in  addition to colitis Has had persistent diarrhea altered mentation Mental status improved after addition of Cipro Questran Plan: Continue p.o. vancomycin 125 mg 4 times daily S/p Flagyl and cipro, switch to Zosyn as per pharmacy recommendation Discontinued Questran due to interaction with oral vancomycin  Daily probiotic Protein supplement IV fluid Ringer lactate 50 ml/hr  Monitor BM As needed antiemetics   Diabetes Patient does not appear to be on any antidiabetic medication Hemoglobin A1c 7.2 Carb modified diet Sliding scale insulin Accu-Cheks AC at bedtime  CKD stage IV Creatinine at or near baseline IV fluids as above Avoid nephrotoxin Daily renal function  Chronic hypotension Continue midodrine 5 mg po TID prn if SBP <100 mmHg   Hypertension currently patient has uncontrolled blood pressure Started amlodipine 5 mg p.o. daily, hydralazine IV as needed We will continue to monitor BP and titrate medication accordingly  Depression Dementia, Alzheimer's type Continue home Aricept Continue home Lexapro  COPD Continue home albuterol Continue home Spiriva As needed nebs Oxygen if needed Continue Zyrtec, loratadine on substitution  Left kidney lesion, incidental finding on CT scan.  Recommended MRI with breath-hold technique as an outpatient. Follow-up with PCP for further management   Body mass index is 28.34 kg/m.  Interventions:        Diet: Diabetic diet DVT Prophylaxis: Subcutaneous Heparin    Advance goals of care discussion: DNR  Family Communication: family was present at bedside, at the time of interview.  The pt provided permission to discuss medical plan with the family. Opportunity was given to ask question and all questions were answered satisfactorily.   Disposition:  Pt is from home, admitted with C. difficile colitis with possible diverticulitis, still  has diarrhea, and fluctuating blood pressure which precludes a safe  discharge. Discharge to home, when diarrhea improves and blood pressure remained stable, may be in 1 to 2 days.  Subjective: As per patient's daughter patient was confused last night but today morning she is more awake and alert, AAO x3.  She does have underlying dementia but normally she knows where she is and awaiting alert.  Possibly developed hospital delirium.  Does not remember how many times she had a diarrhea, or any active issues.   Physical Exam: General:  Alert, NAD.  Appear in no distress, affect appropriate Eyes: PERRLA ENT: Oral Mucosa Clear, moist  Neck: no JVD,  Cardiovascular: S1 and S2 Present, no Murmur,  Respiratory: good respiratory effort, Bilateral Air entry equal and Decreased, no Crackles, no wheezes Abdomen: Bowel Sound present, Soft and no tenderness,  Skin: no rashes Extremities: no Pedal edema, no calf tenderness Neurologic: without any new focal findings Gait not checked due to patient safety concerns  Vitals:   11/18/20 2101 11/19/20 0036 11/19/20 0427 11/19/20 0815  BP: (!) 174/75 (!) 167/72 (!) 191/84 (!) 154/65  Pulse: 71 84 85 81  Resp: 20  19 18   Temp:  98.3 F (36.8 C)  97.8 F (36.6 C)  TempSrc:      SpO2: 100% 95% 96% 96%  Weight:      Height:       No intake or output data in the 24 hours ending 11/19/20 1417 Filed Weights   11/14/20 1335  Weight: 72.6 kg    Data Reviewed: I have personally reviewed and interpreted daily labs, tele strips, imagings as discussed above. I reviewed all nursing notes, pharmacy notes, vitals, pertinent old records I have discussed plan of care as described above with RN and patient/family.  CBC: Recent Labs  Lab 11/14/20 1146 11/14/20 1337 11/15/20 0442 11/16/20 0737 11/17/20 0633 11/18/20 0349 11/19/20 0603  WBC 15.3*   < > 11.7* 17.5* 12.6* 10.3 10.1  NEUTROABS 9.3*  --   --  10.4* 6.7 4.4 4.4  HGB 12.2   < > 10.3* 9.6* 9.2* 9.3* 10.0*  HCT 37.9   < > 31.2* 29.4* 28.2* 27.9* 30.8*  MCV  92.7   < > 91.0 89.4 91.0 89.4 90.9  PLT 283   < > 230 233 240 265 307   < > = values in this interval not displayed.   Basic Metabolic Panel: Recent Labs  Lab 11/14/20 1337 11/15/20 0442 11/16/20 0737 11/17/20 0633 11/18/20 0349 11/19/20 0603  NA 136 135 134* 138 138 140  K 4.1 3.5 3.4* 3.9 3.8 4.0  CL 103 106 105 111 112* 112*  CO2 23 20* 21* 21* 21* 22  GLUCOSE 220* 99 114* 101* 158* 109*  BUN 41* 36* 26* 22 20 14   CREATININE 2.18* 1.88* 1.63* 1.81* 1.55* 1.52*  CALCIUM 8.9 8.3* 7.9* 8.0* 7.7* 8.1*  MG 2.3  --  1.8 2.2 2.0 1.9  PHOS 3.8  --   --   --   --  4.1    Studies: No results found.  Scheduled Meds: . amLODipine  5 mg Oral Daily  . donepezil  5 mg Oral QHS  . escitalopram  20 mg Oral Daily  . heparin  5,000 Units Subcutaneous Q8H  . insulin aspart  0-5 Units Subcutaneous QHS  . insulin aspart  0-9 Units Subcutaneous TID WC  . loratadine  10 mg Oral Daily  . tiotropium  18 mcg Inhalation Daily  .  vancomycin  125 mg Oral QID   Continuous Infusions: . lactated ringers 75 mL/hr at 11/19/20 0039  . piperacillin-tazobactam (ZOSYN)  IV 3.375 g (11/19/20 0608)   PRN Meds: albuterol, hydrALAZINE, midodrine, ondansetron **OR** ondansetron (ZOFRAN) IV  Time spent: 35 minutes  Author: Val Riles. MD [facility] 11/19/2020 2:17 PM  To reach On-call, see care teams to locate the attending and reach out to them via [email or web address]. If 7PM-7AM, please contact night-coverage If you still have difficulty reaching the attending provider, please page the [facility] (Director on Call) for [facility] on amion for assistance.

## 2020-11-20 ENCOUNTER — Other Ambulatory Visit: Payer: Medicare PPO

## 2020-11-20 DIAGNOSIS — A0472 Enterocolitis due to Clostridium difficile, not specified as recurrent: Secondary | ICD-10-CM | POA: Diagnosis not present

## 2020-11-20 LAB — BASIC METABOLIC PANEL
Anion gap: 6 (ref 5–15)
BUN: 13 mg/dL (ref 8–23)
CO2: 21 mmol/L — ABNORMAL LOW (ref 22–32)
Calcium: 8 mg/dL — ABNORMAL LOW (ref 8.9–10.3)
Chloride: 112 mmol/L — ABNORMAL HIGH (ref 98–111)
Creatinine, Ser: 1.62 mg/dL — ABNORMAL HIGH (ref 0.44–1.00)
GFR, Estimated: 31 mL/min — ABNORMAL LOW (ref >60)
Glucose, Bld: 88 mg/dL (ref 70–99)
Potassium: 4.2 mmol/L (ref 3.5–5.1)
Sodium: 139 mmol/L (ref 135–145)

## 2020-11-20 LAB — CBC WITH DIFFERENTIAL/PLATELET
Abs Immature Granulocytes: 0.11 10*3/uL — ABNORMAL HIGH (ref 0.00–0.07)
Basophils Absolute: 0 10*3/uL (ref 0.0–0.1)
Basophils Relative: 0 %
Eosinophils Absolute: 0.2 10*3/uL (ref 0.0–0.5)
Eosinophils Relative: 1 %
HCT: 30.7 % — ABNORMAL LOW (ref 36.0–46.0)
Hemoglobin: 10.1 g/dL — ABNORMAL LOW (ref 12.0–15.0)
Immature Granulocytes: 1 %
Lymphocytes Relative: 42 %
Lymphs Abs: 4.7 10*3/uL — ABNORMAL HIGH (ref 0.7–4.0)
MCH: 30.2 pg (ref 26.0–34.0)
MCHC: 32.9 g/dL (ref 30.0–36.0)
MCV: 91.9 fL (ref 80.0–100.0)
Monocytes Absolute: 1 10*3/uL (ref 0.1–1.0)
Monocytes Relative: 9 %
Neutro Abs: 5.2 10*3/uL (ref 1.7–7.7)
Neutrophils Relative %: 47 %
Platelets: 326 10*3/uL (ref 150–400)
RBC: 3.34 MIL/uL — ABNORMAL LOW (ref 3.87–5.11)
RDW: 13.5 % (ref 11.5–15.5)
WBC: 11.2 10*3/uL — ABNORMAL HIGH (ref 4.0–10.5)
nRBC: 0 % (ref 0.0–0.2)

## 2020-11-20 LAB — GLUCOSE, CAPILLARY
Glucose-Capillary: 94 mg/dL (ref 70–99)
Glucose-Capillary: 135 mg/dL — ABNORMAL HIGH (ref 70–99)
Glucose-Capillary: 113 mg/dL — ABNORMAL HIGH (ref 70–99)
Glucose-Capillary: 127 mg/dL — ABNORMAL HIGH (ref 70–99)

## 2020-11-20 LAB — PHOSPHORUS: Phosphorus: 3.7 mg/dL (ref 2.5–4.6)

## 2020-11-20 LAB — MAGNESIUM: Magnesium: 2.1 mg/dL (ref 1.7–2.4)

## 2020-11-20 MED ORDER — RISAQUAD PO CAPS
1.0000 | ORAL_CAPSULE | Freq: Every day | ORAL | Status: DC
  Administered 2020-11-20 – 2020-11-21 (×2): 1 via ORAL
  Filled 2020-11-20 (×2): qty 1

## 2020-11-20 MED ORDER — LACTATED RINGERS IV SOLN: INTRAVENOUS | Status: DC

## 2020-11-20 NOTE — Care Management Important Message (Signed)
Important Message  Patient Details  Name: Jill [name] MRN: [id number] Date of Birth: Mar 22, 1932   Medicare Important Message Given:  Yes  Patient is in an isolation room so call the room [phone]) and granddaughter, Azalea Cedar said she was asleep and prefers not to wake her.  She asked if I could I review the form with her and she is listed as pt's contact so I reviewed the Important Message from Medicare with her.  I asked if she would like a copy and she asked me to send to her email:  [email or web address].  I sent in a [place] email and thanked her for her time.   [name] A Corona Popovich 11/20/2020, 3:41 PM

## 2020-11-20 NOTE — Progress Notes (Signed)
[facility] Progress Note  Patient: Jill [name]    OJJ:[id number]  DOA: 11/14/2020     Date of Service: the patient was seen and examined on 11/20/2020  Chief Complaint  Patient presents with  . Diarrhea   Brief hospital course: 87 y.o.femalewith medical history significant ofchronic kidney disease stage III, diabetes, hyperlipidemia, hypertension, osteoarthritis who presented to the ER with persistent diarrhea. Patient was apparently treated about 7 to 10 days ago for UTI. She did get Rocephin shot. Since then she developed some diarrhea days later. She was given oral antibiotics that she was unable to tolerate. Denied any urinary symptoms. Denied any nausea or vomiting but she was initially not able to take anything by mouth. She has abdominal cramping. Denied any melena no bright red blood per rectum. Patient was seen in the ER and stool studies confirm C. difficile positive.   Started on regimen of p.o. vancomycin 125 4 times daily and aggressive intravenous fluids.  In the setting of persistent diarrhea, increasing white blood cell count, worsening confusion I added ciprofloxacin and metronidazole for concomitant treatment of acute diverticulitis, increased rate of IV fluids, added p.o. Questran for control of diarrhea  Patient seems to be responding to this regimen.  White count now decreasing.  Mental status at baseline   Assessment and Plan: Principal Problem:   C. difficile diarrhea Active Problems:   Type 2 diabetes mellitus with diabetic neuropathy, unspecified (HCC)   Hyperlipidemia   HYPERTENSION, BENIGN SYSTEMIC   CKD (chronic kidney disease) stage 4, GFR 15-29 ml/min (HCC)   Stage 2 moderate COPD by GOLD classification (HCC)  Acute diarrhea secondary to C. difficile colitis Concomitant acute diverticulitis In the setting of recent antibiotic use No previous history of C. difficile infection CT on admission demonstrates acute diverticulitis in  addition to colitis Has had persistent diarrhea altered mentation Mental status improved after addition of Cipro Questran Plan: Continue p.o. vancomycin 125 mg 4 times daily S/p Flagyl and cipro, switch to Zosyn, and d/c'd on 4/as per pharmacy recommendation, as pt has no abd pain Discontinued Questran due to interaction with oral vancomycin  Daily probiotic Protein supplement IV fluid Ringer lactate  64ml/hr  Monitor BM As needed antiemetics    Diabetes Patient does not appear to be on any antidiabetic medication Hemoglobin A1c 7.2 Carb modified diet Sliding scale insulin Accu-Cheks AC at bedtime  CKD stage IV Creatinine at or near baseline IV fluids as above Avoid nephrotoxin Daily renal function  Chronic hypotension Continue midodrine 5 mg po TID prn if SBP <100 mmHg   Hypertension currently patient has uncontrolled blood pressure Started amlodipine 5 mg p.o. daily, hydralazine IV as needed We will continue to monitor BP and titrate medication accordingly  Depression Dementia, Alzheimer's type Continue home Aricept Continue home Lexapro  COPD Continue home albuterol Continue home Spiriva As needed nebs Oxygen if needed Continue Zyrtec, loratadine on substitution  Left kidney lesion, incidental finding on CT scan.  Recommended MRI with breath-hold technique as an outpatient. Follow-up with PCP for further management   Body mass index is 28.34 kg/m.  Interventions:        Diet: Diabetic diet DVT Prophylaxis: Subcutaneous Heparin    Advance goals of care discussion: DNR  Family Communication: family was present at bedside, at the time of interview.  The pt provided permission to discuss medical plan with the family. Opportunity was given to ask question and all questions were answered satisfactorily.   Disposition:  Pt is from  home, admitted with C. difficile colitis with possible diverticulitis, still has diarrhea, and fluctuating blood pressure  which precludes a safe discharge. Discharge to home, when diarrhea improves and blood pressure remained stable, likely discharge tomorrow a.m.  Subjective: No overnight issues, last night patient slept well without any sundowning or confusion.  Still having loose stools, patient has memory issues so she does not remember.  Denies any abdominal pain, no nausea vomiting.   Physical Exam: General:  Alert, NAD.  Appear in no distress, affect appropriate Eyes: PERRLA ENT: Oral Mucosa Clear, moist  Neck: no JVD,  Cardiovascular: S1 and S2 Present, no Murmur,  Respiratory: good respiratory effort, Bilateral Air entry equal and Decreased, no Crackles, no wheezes Abdomen: Bowel Sound present, Soft and no tenderness,  Skin: no rashes Extremities: no Pedal edema, no calf tenderness Neurologic: without any new focal findings Gait not checked due to patient safety concerns  Vitals:   11/20/20 0004 11/20/20 0527 11/20/20 0804 11/20/20 1248  BP: (!) 167/68 (!) 142/98 (!) 156/74 (!) 143/66  Pulse: 82 86 81 78  Resp: 16 16 18 17   Temp: 98.4 F (36.9 C) 98 F (36.7 C) 97.6 F (36.4 C) (!) 97.5 F (36.4 C)  TempSrc: Oral Oral Oral Oral  SpO2: 95% 94% 95% 95%  Weight:      Height:        Intake/Output Summary (Last 24 hours) at 11/20/2020 1438 Last data filed at 11/19/2020 2300 Gross per 24 hour  Intake 240 ml  Output --  Net 240 ml   Filed Weights   11/14/20 1335  Weight: 72.6 kg    Data Reviewed: I have personally reviewed and interpreted daily labs, tele strips, imagings as discussed above. I reviewed all nursing notes, pharmacy notes, vitals, pertinent old records I have discussed plan of care as described above with RN and patient/family.  CBC: Recent Labs  Lab 11/16/20 0737 11/17/20 0633 11/18/20 0349 11/19/20 0603 11/20/20 0411  WBC 17.5* 12.6* 10.3 10.1 11.2*  NEUTROABS 10.4* 6.7 4.4 4.4 5.2  HGB 9.6* 9.2* 9.3* 10.0* 10.1*  HCT 29.4* 28.2* 27.9* 30.8* 30.7*  MCV 89.4  91.0 89.4 90.9 91.9  PLT 233 240 265 307 637   Basic Metabolic Panel: Recent Labs  Lab 11/14/20 1337 11/15/20 0442 11/16/20 0737 11/17/20 0633 11/18/20 0349 11/19/20 0603 11/20/20 0411  NA 136   < > 134* 138 138 140 139  K 4.1   < > 3.4* 3.9 3.8 4.0 4.2  CL 103   < > 105 111 112* 112* 112*  CO2 23   < > 21* 21* 21* 22 21*  GLUCOSE 220*   < > 114* 101* 158* 109* 88  BUN 41*   < > 26* 22 20 14 13   CREATININE 2.18*   < > 1.63* 1.81* 1.55* 1.52* 1.62*  CALCIUM 8.9   < > 7.9* 8.0* 7.7* 8.1* 8.0*  MG 2.3  --  1.8 2.2 2.0 1.9 2.1  PHOS 3.8  --   --   --   --  4.1 3.7   < > = values in this interval not displayed.    Studies: No results found.  Scheduled Meds: . acidophilus  1 capsule Oral Daily  . amLODipine  5 mg Oral Daily  . donepezil  5 mg Oral QHS  . escitalopram  20 mg Oral Daily  . heparin  5,000 Units Subcutaneous Q8H  . insulin aspart  0-5 Units Subcutaneous QHS  . insulin aspart  0-9 Units Subcutaneous TID WC  . loratadine  10 mg Oral Daily  . tiotropium  18 mcg Inhalation Daily  . vancomycin  125 mg Oral QID   Continuous Infusions: . lactated ringers 50 mL/hr at 11/19/20 1443  . lactated ringers 75 mL/hr at 11/20/20 0927   PRN Meds: albuterol, hydrALAZINE, midodrine, ondansetron **OR** ondansetron (ZOFRAN) IV  Time spent: 35 minutes  Author: Val Riles. MD [facility] 11/20/2020 2:38 PM  To reach On-call, see care teams to locate the attending and reach out to them via [email or web address]. If 7PM-7AM, please contact night-coverage If you still have difficulty reaching the attending provider, please page the [facility] (Director on Call) for [facility] on amion for assistance.

## 2020-11-21 DIAGNOSIS — A0472 Enterocolitis due to Clostridium difficile, not specified as recurrent: Secondary | ICD-10-CM | POA: Diagnosis not present

## 2020-11-21 LAB — CBC WITH DIFFERENTIAL/PLATELET
Abs Immature Granulocytes: 0.13 10*3/uL — ABNORMAL HIGH (ref 0.00–0.07)
Basophils Absolute: 0 10*3/uL (ref 0.0–0.1)
Basophils Relative: 0 %
Eosinophils Absolute: 0.1 10*3/uL (ref 0.0–0.5)
Eosinophils Relative: 1 %
HCT: 30.7 % — ABNORMAL LOW (ref 36.0–46.0)
Hemoglobin: 10.2 g/dL — ABNORMAL LOW (ref 12.0–15.0)
Immature Granulocytes: 1 %
Lymphocytes Relative: 47 %
Lymphs Abs: 5.4 10*3/uL — ABNORMAL HIGH (ref 0.7–4.0)
MCH: 30.2 pg (ref 26.0–34.0)
MCHC: 33.2 g/dL (ref 30.0–36.0)
MCV: 90.8 fL (ref 80.0–100.0)
Monocytes Absolute: 1 10*3/uL (ref 0.1–1.0)
Monocytes Relative: 8 %
Neutro Abs: 5.1 10*3/uL (ref 1.7–7.7)
Neutrophils Relative %: 43 %
Platelets: 335 10*3/uL (ref 150–400)
RBC: 3.38 MIL/uL — ABNORMAL LOW (ref 3.87–5.11)
RDW: 13.5 % (ref 11.5–15.5)
WBC: 11.7 10*3/uL — ABNORMAL HIGH (ref 4.0–10.5)
nRBC: 0 % (ref 0.0–0.2)

## 2020-11-21 LAB — BASIC METABOLIC PANEL
Anion gap: 7 (ref 5–15)
BUN: 12 mg/dL (ref 8–23)
CO2: 24 mmol/L (ref 22–32)
Calcium: 7.9 mg/dL — ABNORMAL LOW (ref 8.9–10.3)
Chloride: 109 mmol/L (ref 98–111)
Creatinine, Ser: 1.66 mg/dL — ABNORMAL HIGH (ref 0.44–1.00)
GFR, Estimated: 30 mL/min — ABNORMAL LOW (ref >60)
Glucose, Bld: 94 mg/dL (ref 70–99)
Potassium: 4.1 mmol/L (ref 3.5–5.1)
Sodium: 140 mmol/L (ref 135–145)

## 2020-11-21 LAB — MAGNESIUM: Magnesium: 1.7 mg/dL (ref 1.7–2.4)

## 2020-11-21 LAB — GLUCOSE, CAPILLARY: Glucose-Capillary: 110 mg/dL — ABNORMAL HIGH (ref 70–99)

## 2020-11-21 MED ORDER — AMLODIPINE BESYLATE 10 MG PO TABS: 10.0000 mg | ORAL_TABLET | Freq: Every day | ORAL | 0 refills | Status: DC

## 2020-11-21 MED ORDER — AMLODIPINE BESYLATE 10 MG PO TABS
10.0000 mg | ORAL_TABLET | Freq: Every day | ORAL | Status: DC
  Administered 2020-11-21: 10 mg via ORAL
  Filled 2020-11-21: qty 1

## 2020-11-21 MED ORDER — RISAQUAD PO CAPS: 1.0000 | ORAL_CAPSULE | Freq: Every day | ORAL | 0 refills | Status: AC

## 2020-11-21 MED ORDER — AMLODIPINE BESYLATE 10 MG PO TABS: 10.0000 mg | ORAL_TABLET | Freq: Every day | ORAL | Status: DC

## 2020-11-21 MED ORDER — MIDODRINE HCL 5 MG PO TABS: 5.0000 mg | ORAL_TABLET | Freq: Every day | ORAL | Status: DC | PRN

## 2020-11-21 MED ORDER — VANCOMYCIN HCL 125 MG PO CAPS: 125.0000 mg | ORAL_CAPSULE | Freq: Four times a day (QID) | ORAL | 0 refills | Status: AC

## 2020-11-21 NOTE — Progress Notes (Signed)
Patient A&O and able to make needs known. Granddaughter, [name], at bedside throughout discharge process. AVS reviewed with patient and granddaughter who acknowledge understanding via teach back regarding medications, follow up appointments, signs and symptoms to notify MD as well as limitations and restrictions. Granddaughter will transport patient home via private vehicle.

## 2020-11-21 NOTE — Progress Notes (Signed)
Patient hypertensive 174/77; gave 10mg  IV hydralazine. Rechecked and 162/69. Night coverage Judd Gaudier, MD notified.

## 2020-11-21 NOTE — Discharge Summary (Signed)
[facility] Discharge Summary   Patient: Jill [name]  PCP: Jinny Sanders, MD  Date of admission: 11/14/2020   Date of discharge:  11/21/2020     Discharge Diagnoses:  Principal Problem:   C. difficile diarrhea Active Problems:   Type 2 diabetes mellitus with diabetic neuropathy, unspecified (HCC)   Hyperlipidemia   HYPERTENSION, BENIGN SYSTEMIC   CKD (chronic kidney disease) stage 4, GFR 15-29 ml/min (HCC)   Stage 2 moderate COPD by GOLD classification ([place])   Admitted From: Home Disposition:  Home   Recommendations for Outpatient Follow-up:  1. PCP: in 1 wk, monitor BP and titrate medications accordingly 2. Follow up LABS/TEST: Repeat CBC and BMP   Diet recommendation: Cardiac diet  Activity: The patient is advised to gradually reintroduce usual activities, as tolerated  Discharge Condition: stable  Code Status: DNR   History of present illness: As per the H and P dictated on admission Hospital Course:  87 y.o.femalewith medical history significant ofchronic kidney disease stage III, diabetes, hyperlipidemia, hypertension, osteoarthritis who presented to the ER with persistent diarrhea. Patient was apparently treated about 7 to 10 days ago for UTI. She did get Rocephin shot. Since then she developed some diarrhea days later. She was given oral antibiotics that she was unable to tolerate. Denied any urinary symptoms. Denied any nausea or vomiting but she was initially not able to take anything by mouth. She has abdominal cramping. Denied any melena no bright red blood per rectum. Patient was seen in the ER and stool studies confirm C. difficile positive.  Started on regimen of p.o. vancomycin 125 4 times daily and aggressive intravenous fluids.In the setting of persistent diarrhea, increasing white blood cell count, worsening confusion I added ciprofloxacin and metronidazole for concomitant treatment of acute diverticulitis, increased rate of IV  fluids, added p.o. Questran for control of diarrhea Patient seems to be responding to this regimen. White count nowdecreasing. Mental status at baseline Assessment and Plan: # Acute diarrhea secondary to C. difficile colitis and Concomitant acute diverticulitis. In the setting of recent antibiotic use. No previous history of C. difficile infection. CT on admission demonstrates acute diverticulitis in addition to colitis Has had persistent diarrhea altered mentation. Mental status improved after addition of Cipro Questran. S/p p.o. vancomycin 125 mg 4 times daily, S/p Flagyl and cipro, switch to Zosyn, and d/c'd on 4/as per pharmacy recommendation, as pt has no abd pain. Discontinued Questran due to interaction with oral vancomycin. Continue Daily probioti. S/p Protein supplement and IV fluid Ringer lactate  4ml/hr. patient's diarrhea improved, still seems to be little bit dehydrated, WBC count and creatinine slightly elevated.  Patient was advised to follow with PCP and repeat CBC and BMP after 1 week.  Continue oral vancomycin for 4 additional days to complete total 10 days course. # Diabetes, Patient does not appear to be on any antidiabetic medication. Hemoglobin A1c 7.2, continue Carb modified diet. S/p Sliding scale insulin.  Follow with PCP # CKD stage IVm Creatinine at or near baseline, s/p IV fluids as above.  Repeat BMP after 1 week and continue oral hydration. # Chronic hypotension, resolved, currently patient is hypotensive.  Patient was advised to take midodrine only if the blood pressure drops and hold amlodipine. # Hypertension currently patient has uncontrolled blood pressure, Started amlodipine 5 mg p.o. daily, and increase to 10 mg due to persistent high blood pressure.  Patient was advised to monitor BP and follow with PCP for further management.   #  Depression and Dementia, Alzheimer's type, Continue home Aricept and Lexapro # COPD, Continue home albuterol, Spiriva, Zyrtec,  #  Left kidney lesion, incidental finding on CT scan.  Recommended MRI with breath-hold technique as an outpatient. Follow-up with PCP for further management Body mass index is 28.34 kg/m.  Nutrition Interventions:   Patient was ambulatory without any assistance. On the day of the discharge the patient's vitals were stable, and no other acute medical condition were reported by patient. the patient was felt safe to be discharge at Home.  Consultants: None Procedures: none  Discharge Exam: General: Appear in no distress, no Rash; Oral Mucosa Clear, moist. Cardiovascular: S1 and S2 Present, no Murmur, Respiratory: normal respiratory effort, Bilateral Air entry present and no Crackles, no wheezes Abdomen: Bowel Sound present, Soft and no tenderness, no hernia Extremities: no Pedal edema, no calf tenderness Neurology: alert and oriented to time, place, and person affect appropriate.  Filed Weights   11/14/20 1335  Weight: 72.6 kg   Vitals:   11/21/20 0538 11/21/20 0824  BP: (!) 162/69 (!) 157/72  Pulse: 93 90  Resp:  17  Temp:  98.1 F (36.7 C)  SpO2:  94%    DISCHARGE MEDICATION: Allergies as of 11/21/2020      Reactions   Lovastatin Other (See Comments)   REACTION: leg pain   Statins Other (See Comments)   REACTION: leg cramps, weakness   Sulfa Antibiotics Hives, Itching   Codeine Rash   Niacin Rash   Flushing also      Medication List    STOP taking these medications   cephALEXin 500 MG capsule Commonly known as: KEFLEX   doxycycline 100 MG tablet Commonly known as: VIBRA-TABS     TAKE these medications   acidophilus Caps capsule Take 1 capsule by mouth daily for 10 days. Start taking on: November 22, 2020   albuterol 108 (90 Base) MCG/ACT inhaler Commonly known as: VENTOLIN HFA Inhale 2 puffs into the lungs every 4 (four) hours as needed for wheezing or shortness of breath.   amLODipine 10 MG tablet Commonly known as: NORVASC Take 1 tablet (10 mg total) by  mouth daily. Skip dose if SBP <130 mmHg Start taking on: November 22, 4164   bismuth subsalicylate 063 MG chewable tablet Commonly known as: PEPTO BISMOL Chew 262 mg by mouth as needed for diarrhea or loose stools.   cetirizine 5 MG tablet Commonly known as: ZYRTEC Take 1 tablet (5 mg total) by mouth daily.   diclofenac Sodium 1 % Gel Commonly known as: Voltaren Apply 2 g topically 4 (four) times daily.   donepezil 5 MG tablet Commonly known as: ARICEPT TAKE 1 TABLET BY MOUTH EVERYDAY AT BEDTIME What changed: See the new instructions.   escitalopram 20 MG tablet Commonly known as: LEXAPRO TAKE 1 TABLET BY MOUTH EVERY DAY   midodrine 5 MG tablet Commonly known as: PROAMATINE Take 1 tablet (5 mg total) by mouth daily as needed (if SBP <100 and stop Amlodipine). What changed:   when to take this  reasons to take this   multivitamin with minerals tablet Take 1 tablet by mouth daily.   Spiriva HandiHaler 18 MCG inhalation capsule Generic drug: tiotropium INHALE 1 CAPSULE VIA HANDIHALER ONCE DAILY AT THE SAME TIME EVERY DAY   vancomycin 125 MG capsule Commonly known as: VANCOCIN Take 1 capsule (125 mg total) by mouth 4 (four) times daily for 4 days.   Vitamin D (Ergocalciferol) 1.25 MG (50000 UNIT) Caps capsule Commonly  known as: DRISDOL TAKE 1 CAPSULE (50,000 UNITS TOTAL) BY MOUTH EVERY FRIDAY.      Allergies  Allergen Reactions  . Lovastatin Other (See Comments)    REACTION: leg pain  . Statins Other (See Comments)    REACTION: leg cramps, weakness  . Sulfa Antibiotics Hives and Itching  . Codeine Rash  . Niacin Rash    Flushing also   Discharge Instructions    Call MD for:  persistant nausea and vomiting   Complete by: As directed    Call MD for:  severe uncontrolled pain   Complete by: As directed    Call MD for:  temperature >100.4   Complete by: As directed    Diet - low sodium heart healthy   Complete by: As directed    Discharge instructions    Complete by: As directed    Follow-up with PCP in 1 week, monitor blood pressure at home and follow with PCP to titrate medications accordingly.  Blood pressure remained high so started amlodipine.  Stop amlodipine if blood pressure drops and resume midodrine as patient was using before hospitalization.  Repeat CBC and BMP after 1 week.   Increase activity slowly   Complete by: As directed       The results of significant diagnostics from this hospitalization (including imaging, microbiology, ancillary and laboratory) are listed below for reference.    Significant Diagnostic Studies: CT ABDOMEN PELVIS WO CONTRAST  Result Date: 11/14/2020 CLINICAL DATA:  Right history of: Diverticulitis suspected Nausea, vomiting, diarrhea. Recently diagnosed with urinary tract infection. EXAM: CT ABDOMEN AND PELVIS WITHOUT CONTRAST TECHNIQUE: Multidetector CT imaging of the abdomen and pelvis was performed following the standard protocol without IV contrast. COMPARISON:  Noncontrast CT 01/27/2015, renal mass protocol MRI 07/10/2015 FINDINGS: Lower chest: Small bilateral pulmonary nodules are stable from 2016 and considered benign. No acute airspace disease. No pleural effusion. Trace pericardial effusion. Hepatobiliary: No focal hepatic abnormality on noncontrast exam. No gallstones, pericholecystic fat stranding or biliary dilatation. Pancreas: Mild parenchymal atrophy. No ductal dilatation or inflammation. Spleen: Normal in size without focal abnormality. Adrenals/Urinary Tract: Mild adrenal thickening without dominant adrenal nodule. Bilateral renal parenchymal thinning and areas of scarring. No hydronephrosis or renal calculi. No significant perinephric edema. Bilateral renal cysts as well as scattered high-density lesions that are incompletely characterized on this noncontrast exam. One of these lesions in the lower left kidney appears increased in size from prior exam measuring 19 mm, series 2, image 35, with  some internal high density. Urinary bladder is grossly negative, partially obscured by motion and streak artifact from bilateral hip arthroplasties. Stomach/Bowel: Small hiatal hernia. Stomach is decompressed. No small bowel obstruction or inflammation. Normal appendix. Small volume of colonic stool. There is colonic tortuosity. Diverticulosis from the splenic flexure distally. There is mild fat stranding adjacent to a diverticulum at the junction of the descending and sigmoid colon, series 2, image 52, suspicious for acute diverticulitis. No evidence of abscess or perforation. Suggestion of mild sigmoid colonic wall thickening diffusely. Portions of the sigmoid colon are obscured by streak artifact from bilateral hip arthroplasties. Vascular/Lymphatic: Aorto bi-iliac atherosclerosis. No aortic aneurysm. Small retroperitoneal lymph nodes are not enlarged by size criteria. No enlarged lymph nodes in the abdomen or pelvis. Reproductive: Hysterectomy.  Quiescent ovaries.  No adnexal mass. Other: No free air, ascites, or focal fluid collection. There is a tiny fat containing umbilical hernia. Musculoskeletal: Bilateral hip arthroplasties. Diffuse degenerative change in the lumbar spine. There are no acute or suspicious  osseous abnormalities. IMPRESSION: 1. Mild fat stranding adjacent to a diverticulum at the junction of the descending and sigmoid colon, suspicious for acute diverticulitis. No perforation or abscess. 2. Suggestion of mild diffuse sigmoid colonic wall thickening. This is in the region of multiple diverticula. Findings may be due to mural hypertrophy, mild colitis is difficult to exclude. 3. Bilateral renal parenchymal thinning and areas of scarring. Bilateral renal cysts as well as scattered high-density lesions that are incompletely characterized on this noncontrast exam. One of these lesions in the lower left kidney appears increased in size from prior exam measuring 19 mm, with some internal high  density. Recommend nonemergent renal protocol MRI for characterization on an elective basis, giving consideration to patient's advanced age. MRI should allow me considered if patient is able to tolerate breath hold technique. 4. Small hiatal hernia. Aortic Atherosclerosis (ICD10-I70.0). Electronically Signed   By: Keith Rake M.D.   On: 11/14/2020 17:36   DG Abd 1 View  Result Date: 11/16/2020 CLINICAL DATA:  Diarrhea. EXAM: ABDOMEN - 1 VIEW COMPARISON:  CT abdomen and pelvis 11/14/2020. Chest and two views abdomen 01/27/2015. FINDINGS: The bowel gas pattern is normal. No radio-opaque calculi or other significant radiographic abnormality are seen. Bilateral hip replacements and multilevel lumbar spondylosis are noted. IMPRESSION: No acute finding. Electronically Signed   By: Inge Rise M.D.   On: 11/16/2020 15:45    Microbiology: Recent Results (from the past 240 hour(s))  C Difficile Quick Screen w PCR reflex     Status: Abnormal   Collection Time: 11/14/20  4:10 PM   Specimen: STOOL  Result Value Ref Range Status   C Diff antigen POSITIVE (A) NEGATIVE Final    Comment: RESULT CALLED TO, READ BACK BY AND VERIFIED WITH: brooke Myrick @ 0539 on 11/14/2020 by caf    C Diff toxin POSITIVE (A) NEGATIVE Final    Comment: RESULT CALLED TO, READ BACK BY AND VERIFIED WITH: Brooke Myrick @ [phone] on 11/14/2020 by Ventura Sellers Diff interpretation Toxin producing C. difficile detected.  Final    Comment: Performed at [facility], [address] [place], [place] [address]  Gastrointestinal Panel by PCR , Stool     Status: None   Collection Time: 11/14/20  4:10 PM   Specimen: STOOL  Result Value Ref Range Status   Campylobacter species NOT DETECTED NOT DETECTED Final   Plesimonas shigelloides NOT DETECTED NOT DETECTED Final   Salmonella species NOT DETECTED NOT DETECTED Final   Yersinia enterocolitica NOT DETECTED NOT DETECTED Final   Vibrio species NOT DETECTED NOT DETECTED Final    Vibrio cholerae NOT DETECTED NOT DETECTED Final   Enteroaggregative E coli (EAEC) NOT DETECTED NOT DETECTED Final   Enteropathogenic E coli (EPEC) NOT DETECTED NOT DETECTED Final   Enterotoxigenic E coli (ETEC) NOT DETECTED NOT DETECTED Final   Shiga like toxin producing E coli (STEC) NOT DETECTED NOT DETECTED Final   Shigella/Enteroinvasive E coli (EIEC) NOT DETECTED NOT DETECTED Final   Cryptosporidium NOT DETECTED NOT DETECTED Final   Cyclospora cayetanensis NOT DETECTED NOT DETECTED Final   Entamoeba histolytica NOT DETECTED NOT DETECTED Final   Giardia lamblia NOT DETECTED NOT DETECTED Final   Adenovirus F40/41 NOT DETECTED NOT DETECTED Final   Astrovirus NOT DETECTED NOT DETECTED Final   Norovirus GI/GII NOT DETECTED NOT DETECTED Final   Rotavirus A NOT DETECTED NOT DETECTED Final   Sapovirus (I, II, IV, and V) NOT DETECTED NOT DETECTED Final  Comment: Performed at [facility], [place], [place] [address]  SARS CORONAVIRUS 2 (TAT 6-24 HRS) Nasopharyngeal Nasopharyngeal Swab     Status: None   Collection Time: 11/14/20  7:29 PM   Specimen: Nasopharyngeal Swab  Result Value Ref Range Status   SARS Coronavirus 2 NEGATIVE NEGATIVE Final    Comment: (NOTE) SARS-CoV-2 target nucleic acids are NOT DETECTED.  The SARS-CoV-2 RNA is generally detectable in upper and lower respiratory specimens during the acute phase of infection. Negative results do not preclude SARS-CoV-2 infection, do not rule out co-infections with other pathogens, and should not be used as the sole basis for treatment or other patient management decisions. Negative results must be combined with clinical observations, patient history, and epidemiological information. The expected result is Negative.  Fact Sheet for Patients: SugarRoll.be  Fact Sheet for Healthcare Providers: https://www.woods-mathews.com/  This test is not yet approved or  cleared by the [place] FDA and  has been authorized for detection and/or diagnosis of SARS-CoV-2 by FDA under an Emergency Use Authorization (EUA). This EUA will remain  in effect (meaning this test can be used) for the duration of the COVID-19 declaration under Se ction 564(b)(1) of the Act, 21 U.S.C. section 360bbb-3(b)(1), unless the authorization is terminated or revoked sooner.  Performed at [facility], [place] [address] [place], [place] [address]      Labs: CBC: Recent Labs  Lab 11/17/20 [phone] 11/18/20 0349 11/19/20 0603 11/20/20 0411 11/21/20 0339  WBC 12.6* 10.3 10.1 11.2* 11.7*  NEUTROABS 6.7 4.4 4.4 5.2 5.1  HGB 9.2* 9.3* 10.0* 10.1* 10.2*  HCT 28.2* 27.9* 30.8* 30.7* 30.7*  MCV 91.0 89.4 90.9 91.9 90.8  PLT 240 265 307 326 841   Basic Metabolic Panel: Recent Labs  Lab 11/14/20 1337 11/15/20 0442 11/17/20 0633 11/18/20 0349 11/19/20 0603 11/20/20 0411 11/21/20 0339  NA 136   < > 138 138 140 139 140  K 4.1   < > 3.9 3.8 4.0 4.2 4.1  CL 103   < > 111 112* 112* 112* 109  CO2 23   < > 21* 21* 22 21* 24  GLUCOSE 220*   < > 101* 158* 109* 88 94  BUN 41*   < > 22 20 14 13 12   CREATININE 2.18*   < > 1.81* 1.55* 1.52* 1.62* 1.66*  CALCIUM 8.9   < > 8.0* 7.7* 8.1* 8.0* 7.9*  MG 2.3   < > 2.2 2.0 1.9 2.1 1.7  PHOS 3.8  --   --   --  4.1 3.7  --    < > = values in this interval not displayed.   Liver Function Tests: Recent Labs  Lab 11/14/20 1146 11/14/20 1337 11/15/20 0442  AST 23 21 13*  ALT 17 16 11   ALKPHOS 64 63 49  BILITOT 0.6 0.7 0.7  PROT 6.4* 7.1 5.6*  ALBUMIN 2.9* 3.3* 2.5*   Recent Labs  Lab 11/14/20 1337  LIPASE 26   No results for input(s): AMMONIA in the last 168 hours. Cardiac Enzymes: No results for input(s): CKTOTAL, CKMB, CKMBINDEX, TROPONINI in the last 168 hours. BNP (last 3 results) No results for input(s): BNP in the last 8760 hours. CBG: Recent Labs  Lab 11/20/20 0806 11/20/20 1249 11/20/20 1644  11/20/20 2110 11/21/20 0825  GLUCAP 94 135* 113* 127* 110*    Time spent: 35 minutes  Signed:  Val Riles  [facility]  11/21/2020 11:27 AM

## 2020-11-23 ENCOUNTER — Telehealth: Payer: Self-pay

## 2020-11-23 NOTE — Telephone Encounter (Signed)
Transition Care Management Follow-up Telephone Call  Date of discharge and from where: 11/21/2020, [facility]  How have you been since you were released from the hospital? Patient is doing fine, just weak.   Any questions or concerns? No  Items Reviewed:  Did the pt receive and understand the discharge instructions provided? Yes   Medications obtained and verified? Yes   Other? No   Any new allergies since your discharge? No   Dietary orders reviewed? Yes  Do you have support at home? Yes   Home Care and Equipment/Supplies: Were home health services ordered? not applicable If so, what is the name of the agency? N/A  Has the agency set up a time to come to the patient's home? not applicable Were any new equipment or medical supplies ordered?  No What is the name of the medical supply agency? N/A Were you able to get the supplies/equipment? not applicable Do you have any questions related to the use of the equipment or supplies? No  Functional Questionnaire: (I = Independent and D = Dependent) ADLs: I  Bathing/Dressing- I  Meal Prep- I  Eating- I  Maintaining continence- I  Transferring/Ambulation- I  Managing Meds- I  Follow up appointments reviewed:   PCP Hospital f/u appt confirmed? Yes  Scheduled to see Dr. Diona Browner on 11/27/2020 @ 2 pm.  [facility] f/u appt confirmed? N/A   Are transportation arrangements needed? No   If their condition worsens, is the pt aware to call PCP or go to the Emergency Dept.? Yes  Was the patient provided with contact information for the PCP's office or ED? Yes  Was to pt encouraged to call back with questions or concerns? Yes

## 2020-11-25 ENCOUNTER — Ambulatory Visit: Payer: Medicare PPO

## 2020-11-26 ENCOUNTER — Other Ambulatory Visit: Payer: Medicare PPO

## 2020-11-27 ENCOUNTER — Other Ambulatory Visit: Payer: Self-pay

## 2020-11-27 ENCOUNTER — Encounter: Payer: Self-pay | Admitting: Family Medicine

## 2020-11-27 ENCOUNTER — Ambulatory Visit: Payer: Medicare PPO | Admitting: Family Medicine

## 2020-11-27 VITALS — BP 140/62 | HR 89 | Temp 98.1°F | Ht 62.0 in | Wt 156.2 lb

## 2020-11-27 DIAGNOSIS — A0472 Enterocolitis due to Clostridium difficile, not specified as recurrent: Secondary | ICD-10-CM

## 2020-11-27 DIAGNOSIS — N183 Chronic kidney disease, stage 3 unspecified: Secondary | ICD-10-CM | POA: Diagnosis not present

## 2020-11-27 DIAGNOSIS — I152 Hypertension secondary to endocrine disorders: Secondary | ICD-10-CM

## 2020-11-27 DIAGNOSIS — N289 Disorder of kidney and ureter, unspecified: Secondary | ICD-10-CM

## 2020-11-27 DIAGNOSIS — E1122 Type 2 diabetes mellitus with diabetic chronic kidney disease: Secondary | ICD-10-CM

## 2020-11-27 DIAGNOSIS — E1159 Type 2 diabetes mellitus with other circulatory complications: Secondary | ICD-10-CM

## 2020-11-27 DIAGNOSIS — I959 Hypotension, unspecified: Secondary | ICD-10-CM | POA: Diagnosis not present

## 2020-11-27 DIAGNOSIS — M10371 Gout due to renal impairment, right ankle and foot: Secondary | ICD-10-CM | POA: Diagnosis not present

## 2020-11-27 NOTE — Progress Notes (Signed)
Patient ID: Jill [name], female    DOB: 10-21-1931, 87 y.o.   MRN: [id number]  This visit was conducted in person.  BP 140/62   Pulse 89   Temp 98.1 F (36.7 C) (Temporal)   Ht 5\' 2"  (1.575 m)   Wt 156 lb 4 oz (70.9 kg)   SpO2 96%   BMI 28.58 kg/m    CC:  Chief Complaint  Patient presents with  . Hospitalization Follow-up    Subjective:   HPI: Jill [name] is a 87 y.o. female presenting on 11/27/2020 for Hospitalization Follow-up  Admitted 11/14/2020 to 11/21/2020 for persistent diarrhea found to have C difficile colitis and diverticulitis    Hospital Course reviewed and  copied below for informational purposes Assessment and Plan: # Acute diarrhea secondary to C. difficile colitis and Concomitant acute diverticulitis. In the setting of recent antibiotic use. No previous history of C. difficile infection. CT on admission demonstrates acute diverticulitis in addition to colitis Has had persistent diarrhea altered mentation. Mental status improved after addition of Cipro Questran. S/p p.o. vancomycin 125 mg 4 times daily, S/p Flagyl and cipro, switch to Zosyn, and d/c'd on 4/as per pharmacy recommendation, as pt has no abd pain. Discontinued Questran due to interaction with oral vancomycin. Continue Daily probioti. S/p Protein supplement and IV fluid Ringer lactate56ml/hr. patient's diarrhea improved, still seems to be little bit dehydrated, WBC count and creatinine slightly elevated.  Patient was advised to follow with PCP and repeat CBC and BMP after 1 week.  Continue oral vancomycin for 4 additional days to complete total 10 days course. # Diabetes, Patient does not appear to be on any antidiabetic medication. Hemoglobin A1c 7.2, continue Carb modified diet. S/p Sliding scale insulin.  Follow with PCP # CKD stage IVm Creatinine at or near baseline, s/p IV fluids as above.  Repeat BMP after 1 week and continue oral hydration. # Chronic hypotension, resolved, currently patient is  hypertensive.  Patient was advised to take midodrine only if the blood pressure drops and hold amlodipine. # Hypertensioncurrently patient has uncontrolled blood pressure, Started amlodipine 5 mg p.o. daily, and increase to 10 mg due to persistent high blood pressure.  Patient was advised to monitor BP and follow with PCP for further management.   # Depression and Dementia, Alzheimer's type, Continue home Aricept and Lexapro # COPD, Continue home albuterol, Spiriva, Zyrtec,  # Left kidney lesion, incidental finding on CT scan. Recommended MRI with breath-hold technique as an outpatient. Follow-up with PCP for further management   Today she reports diarrhea continued but better... several a day but small amounts. occ incontinence. No current blood in stool She has been taking vancomycin but has not been able to take 4 a day... she has one more dose today. No SE to this. She remains very tired but no longer as confused.    Granddaughter noted weakness in right hand.. seems shaky and weak holding cups.  Occ tremor.  She has had improvement in food, moderate water intake... sleeps a lot.   Using tart cherry for gout flare, using topical voltaren gel.  FBs in nornal range.   No fever. No abdominal pain.  Hx of hypotension... granddaughter has  Not been given amlodipine... has been using midodrine. BP Readings from Last 3 Encounters:  11/27/20 140/62  11/21/20 (!) 157/72  11/14/20 (!) 117/54     Relevant past medical, surgical, family and social history reviewed and updated as indicated. Interim medical history since our  last visit reviewed. Allergies and medications reviewed and updated. Outpatient Medications Prior to Visit  Medication Sig Dispense Refill  . acidophilus (RISAQUAD) CAPS capsule Take 1 capsule by mouth daily for 10 days. 10 capsule 0  . cetirizine (ZYRTEC) 5 MG tablet Take 1 tablet (5 mg total) by mouth daily. 90 tablet 0  . diclofenac Sodium (VOLTAREN) 1 % GEL  Apply 2 g topically 4 (four) times daily. 100 g 1  . donepezil (ARICEPT) 5 MG tablet TAKE 1 TABLET BY MOUTH EVERYDAY AT BEDTIME 90 tablet 0  . escitalopram (LEXAPRO) 20 MG tablet TAKE 1 TABLET BY MOUTH EVERY DAY 90 tablet 1  . midodrine (PROAMATINE) 5 MG tablet Take 1 tablet (5 mg total) by mouth daily as needed (if SBP <100 and stop Amlodipine).    . Multiple Vitamins-Minerals (MULTIVITAMIN WITH MINERALS) tablet Take 1 tablet by mouth daily.    Marland Kitchen TART CHERRY PO Take 1 tablet by mouth in the morning and at bedtime.    Marland Kitchen tiotropium (SPIRIVA HANDIHALER) 18 MCG inhalation capsule INHALE 1 CAPSULE VIA HANDIHALER ONCE DAILY AT THE SAME TIME EVERY DAY 30 capsule 5  . Vitamin D, Ergocalciferol, (DRISDOL) 1.25 MG (50000 UNIT) CAPS capsule TAKE 1 CAPSULE (50,000 UNITS TOTAL) BY MOUTH EVERY FRIDAY. 12 capsule 3  . albuterol (PROVENTIL HFA;VENTOLIN HFA) 108 (90 Base) MCG/ACT inhaler Inhale 2 puffs into the lungs every 4 (four) hours as needed for wheezing or shortness of breath. 1 Inhaler 1  . bismuth subsalicylate (PEPTO BISMOL) 262 MG chewable tablet Chew 262 mg by mouth as needed for diarrhea or loose stools.    Marland Kitchen amLODipine (NORVASC) 10 MG tablet Take 1 tablet (10 mg total) by mouth daily. Skip dose if SBP <130 mmHg (Patient not taking: Reported on 11/27/2020) 30 tablet 0   No facility-administered medications prior to visit.     Per HPI unless specifically indicated in ROS section below Review of Systems  Constitutional: Positive for fatigue.  Neurological: Positive for weakness.   Objective:  BP 140/62   Pulse 89   Temp 98.1 F (36.7 C) (Temporal)   Ht 5\' 2"  (1.575 m)   Wt 156 lb 4 oz (70.9 kg)   SpO2 96%   BMI 28.58 kg/m   Wt Readings from Last 3 Encounters:  11/27/20 156 lb 4 oz (70.9 kg)  11/14/20 160 lb (72.6 kg)  09/29/20 160 lb 8 oz (72.8 kg)      Physical Exam    Results for orders placed or performed during the hospital encounter of 11/14/20  C Difficile Quick Screen w PCR  reflex   Specimen: STOOL  Result Value Ref Range   C Diff antigen POSITIVE (A) NEGATIVE   C Diff toxin POSITIVE (A) NEGATIVE   C Diff interpretation Toxin producing C. difficile detected.   Gastrointestinal Panel by PCR , Stool   Specimen: STOOL  Result Value Ref Range   Campylobacter species NOT DETECTED NOT DETECTED   Plesimonas shigelloides NOT DETECTED NOT DETECTED   Salmonella species NOT DETECTED NOT DETECTED   Yersinia enterocolitica NOT DETECTED NOT DETECTED   Vibrio species NOT DETECTED NOT DETECTED   Vibrio cholerae NOT DETECTED NOT DETECTED   Enteroaggregative E coli (EAEC) NOT DETECTED NOT DETECTED   Enteropathogenic E coli (EPEC) NOT DETECTED NOT DETECTED   Enterotoxigenic E coli (ETEC) NOT DETECTED NOT DETECTED   Shiga like toxin producing E coli (STEC) NOT DETECTED NOT DETECTED   Shigella/Enteroinvasive E coli (EIEC) NOT DETECTED NOT DETECTED  Cryptosporidium NOT DETECTED NOT DETECTED   Cyclospora cayetanensis NOT DETECTED NOT DETECTED   Entamoeba histolytica NOT DETECTED NOT DETECTED   Giardia lamblia NOT DETECTED NOT DETECTED   Adenovirus F40/41 NOT DETECTED NOT DETECTED   Astrovirus NOT DETECTED NOT DETECTED   Norovirus GI/GII NOT DETECTED NOT DETECTED   Rotavirus A NOT DETECTED NOT DETECTED   Sapovirus (I, II, IV, and V) NOT DETECTED NOT DETECTED  SARS CORONAVIRUS 2 (TAT 6-24 HRS) Nasopharyngeal Nasopharyngeal Swab   Specimen: Nasopharyngeal Swab  Result Value Ref Range   SARS Coronavirus 2 NEGATIVE NEGATIVE  Lipase, blood  Result Value Ref Range   Lipase 26 11 - 51 U/L  Comprehensive metabolic panel  Result Value Ref Range   Sodium 136 135 - 145 mmol/L   Potassium 4.1 3.5 - 5.1 mmol/L   Chloride 103 98 - 111 mmol/L   CO2 23 22 - 32 mmol/L   Glucose, Bld 220 (H) 70 - 99 mg/dL   BUN 41 (H) 8 - 23 mg/dL   Creatinine, Ser 2.18 (H) 0.44 - 1.00 mg/dL   Calcium 8.9 8.9 - 10.3 mg/dL   Total Protein 7.1 6.5 - 8.1 g/dL   Albumin 3.3 (L) 3.5 - 5.0 g/dL   AST  21 15 - 41 U/L   ALT 16 0 - 44 U/L   Alkaline Phosphatase 63 38 - 126 U/L   Total Bilirubin 0.7 0.3 - 1.2 mg/dL   GFR, Estimated 22 (L) >60 mL/min   Anion gap 10 5 - 15  CBC  Result Value Ref Range   WBC 13.9 (H) 4.0 - 10.5 K/uL   RBC 4.18 3.87 - 5.11 MIL/uL   Hemoglobin 12.4 12.0 - 15.0 g/dL   HCT 37.9 36.0 - 46.0 %   MCV 90.7 80.0 - 100.0 fL   MCH 29.7 26.0 - 34.0 pg   MCHC 32.7 30.0 - 36.0 g/dL   RDW 13.0 11.5 - 15.5 %   Platelets 293 150 - 400 K/uL   nRBC 0.0 0.0 - 0.2 %  Urinalysis, Complete w Microscopic Urine, Catheterized  Result Value Ref Range   Color, Urine YELLOW (A) YELLOW   APPearance CLEAR (A) CLEAR   Specific Gravity, Urine 1.016 1.005 - 1.030   pH 5.0 5.0 - 8.0   Glucose, UA NEGATIVE NEGATIVE mg/dL   Hgb urine dipstick SMALL (A) NEGATIVE   Bilirubin Urine NEGATIVE NEGATIVE   Ketones, ur NEGATIVE NEGATIVE mg/dL   Protein, ur 30 (A) NEGATIVE mg/dL   Nitrite NEGATIVE NEGATIVE   Leukocytes,Ua NEGATIVE NEGATIVE   RBC / HPF 0-5 0 - 5 RBC/hpf   WBC, UA NONE SEEN 0 - 5 WBC/hpf   Bacteria, UA RARE (A) NONE SEEN   Squamous Epithelial / LPF NONE SEEN 0 - 5   Mucus PRESENT    Hyaline Casts, UA PRESENT   Phosphorus  Result Value Ref Range   Phosphorus 3.8 2.5 - 4.6 mg/dL  Magnesium  Result Value Ref Range   Magnesium 2.3 1.7 - 2.4 mg/dL  Hemoglobin A1c  Result Value Ref Range   Hgb A1c MFr Bld 7.2 (H) 4.8 - 5.6 %   Mean Plasma Glucose 159.94 mg/dL  Comprehensive metabolic panel  Result Value Ref Range   Sodium 135 135 - 145 mmol/L   Potassium 3.5 3.5 - 5.1 mmol/L   Chloride 106 98 - 111 mmol/L   CO2 20 (L) 22 - 32 mmol/L   Glucose, Bld 99 70 - 99 mg/dL   BUN 36 (  H) 8 - 23 mg/dL   Creatinine, Ser 1.88 (H) 0.44 - 1.00 mg/dL   Calcium 8.3 (L) 8.9 - 10.3 mg/dL   Total Protein 5.6 (L) 6.5 - 8.1 g/dL   Albumin 2.5 (L) 3.5 - 5.0 g/dL   AST 13 (L) 15 - 41 U/L   ALT 11 0 - 44 U/L   Alkaline Phosphatase 49 38 - 126 U/L   Total Bilirubin 0.7 0.3 - 1.2 mg/dL    GFR, Estimated 26 (L) >60 mL/min   Anion gap 9 5 - 15  CBC  Result Value Ref Range   WBC 11.7 (H) 4.0 - 10.5 K/uL   RBC 3.43 (L) 3.87 - 5.11 MIL/uL   Hemoglobin 10.3 (L) 12.0 - 15.0 g/dL   HCT 31.2 (L) 36.0 - 46.0 %   MCV 91.0 80.0 - 100.0 fL   MCH 30.0 26.0 - 34.0 pg   MCHC 33.0 30.0 - 36.0 g/dL   RDW 13.0 11.5 - 15.5 %   Platelets 230 150 - 400 K/uL   nRBC 0.0 0.0 - 0.2 %  Glucose, capillary  Result Value Ref Range   Glucose-Capillary 115 (H) 70 - 99 mg/dL   Comment 1 Notify RN   Glucose, capillary  Result Value Ref Range   Glucose-Capillary 108 (H) 70 - 99 mg/dL  Glucose, capillary  Result Value Ref Range   Glucose-Capillary 162 (H) 70 - 99 mg/dL  Glucose, capillary  Result Value Ref Range   Glucose-Capillary 154 (H) 70 - 99 mg/dL  Glucose, capillary  Result Value Ref Range   Glucose-Capillary 106 (H) 70 - 99 mg/dL   Comment 1 Notify RN   CBC with Differential/Platelet  Result Value Ref Range   WBC 17.5 (H) 4.0 - 10.5 K/uL   RBC 3.29 (L) 3.87 - 5.11 MIL/uL   Hemoglobin 9.6 (L) 12.0 - 15.0 g/dL   HCT 29.4 (L) 36.0 - 46.0 %   MCV 89.4 80.0 - 100.0 fL   MCH 29.2 26.0 - 34.0 pg   MCHC 32.7 30.0 - 36.0 g/dL   RDW 12.9 11.5 - 15.5 %   Platelets 233 150 - 400 K/uL   nRBC 0.0 0.0 - 0.2 %   Neutrophils Relative % 60 %   Neutro Abs 10.4 (H) 1.7 - 7.7 K/uL   Lymphocytes Relative 22 %   Lymphs Abs 3.8 0.7 - 4.0 K/uL   Monocytes Relative 15 %   Monocytes Absolute 2.6 (H) 0.1 - 1.0 K/uL   Eosinophils Relative 0 %   Eosinophils Absolute 0.1 0.0 - 0.5 K/uL   Basophils Relative 0 %   Basophils Absolute 0.0 0.0 - 0.1 K/uL   Immature Granulocytes 3 %   Abs Immature Granulocytes 0.59 (H) 0.00 - 0.07 K/uL  Basic metabolic panel  Result Value Ref Range   Sodium 134 (L) 135 - 145 mmol/L   Potassium 3.4 (L) 3.5 - 5.1 mmol/L   Chloride 105 98 - 111 mmol/L   CO2 21 (L) 22 - 32 mmol/L   Glucose, Bld 114 (H) 70 - 99 mg/dL   BUN 26 (H) 8 - 23 mg/dL   Creatinine, Ser 1.63 (H) 0.44  - 1.00 mg/dL   Calcium 7.9 (L) 8.9 - 10.3 mg/dL   GFR, Estimated 31 (L) >60 mL/min   Anion gap 8 5 - 15  Magnesium  Result Value Ref Range   Magnesium 1.8 1.7 - 2.4 mg/dL  Glucose, capillary  Result Value Ref Range   Glucose-Capillary 115 (H)  70 - 99 mg/dL   Comment 1 Notify RN   Procalcitonin - Baseline  Result Value Ref Range   Procalcitonin 0.20 ng/mL  Glucose, capillary  Result Value Ref Range   Glucose-Capillary 169 (H) 70 - 99 mg/dL   Comment 1 Notify RN   CBC with Differential/Platelet  Result Value Ref Range   WBC 12.6 (H) 4.0 - 10.5 K/uL   RBC 3.10 (L) 3.87 - 5.11 MIL/uL   Hemoglobin 9.2 (L) 12.0 - 15.0 g/dL   HCT 28.2 (L) 36.0 - 46.0 %   MCV 91.0 80.0 - 100.0 fL   MCH 29.7 26.0 - 34.0 pg   MCHC 32.6 30.0 - 36.0 g/dL   RDW 13.1 11.5 - 15.5 %   Platelets 240 150 - 400 K/uL   nRBC 0.0 0.0 - 0.2 %   Neutrophils Relative % 54 %   Neutro Abs 6.7 1.7 - 7.7 K/uL   Lymphocytes Relative 30 %   Lymphs Abs 3.8 0.7 - 4.0 K/uL   Monocytes Relative 12 %   Monocytes Absolute 1.6 (H) 0.1 - 1.0 K/uL   Eosinophils Relative 1 %   Eosinophils Absolute 0.1 0.0 - 0.5 K/uL   Basophils Relative 0 %   Basophils Absolute 0.0 0.0 - 0.1 K/uL   Immature Granulocytes 3 %   Abs Immature Granulocytes 0.38 (H) 0.00 - 0.07 K/uL  Basic metabolic panel  Result Value Ref Range   Sodium 138 135 - 145 mmol/L   Potassium 3.9 3.5 - 5.1 mmol/L   Chloride 111 98 - 111 mmol/L   CO2 21 (L) 22 - 32 mmol/L   Glucose, Bld 101 (H) 70 - 99 mg/dL   BUN 22 8 - 23 mg/dL   Creatinine, Ser 1.81 (H) 0.44 - 1.00 mg/dL   Calcium 8.0 (L) 8.9 - 10.3 mg/dL   GFR, Estimated 27 (L) >60 mL/min   Anion gap 6 5 - 15  Magnesium  Result Value Ref Range   Magnesium 2.2 1.7 - 2.4 mg/dL  Glucose, capillary  Result Value Ref Range   Glucose-Capillary 136 (H) 70 - 99 mg/dL   Comment 1 Notify RN   Glucose, capillary  Result Value Ref Range   Glucose-Capillary 104 (H) 70 - 99 mg/dL  Glucose, capillary  Result Value  Ref Range   Glucose-Capillary 113 (H) 70 - 99 mg/dL   Comment 1 Notify RN   Glucose, capillary  Result Value Ref Range   Glucose-Capillary 172 (H) 70 - 99 mg/dL   Comment 1 Notify RN   Glucose, capillary  Result Value Ref Range   Glucose-Capillary 138 (H) 70 - 99 mg/dL   Comment 1 Notify RN   CBC with Differential/Platelet  Result Value Ref Range   WBC 10.3 4.0 - 10.5 K/uL   RBC 3.12 (L) 3.87 - 5.11 MIL/uL   Hemoglobin 9.3 (L) 12.0 - 15.0 g/dL   HCT 27.9 (L) 36.0 - 46.0 %   MCV 89.4 80.0 - 100.0 fL   MCH 29.8 26.0 - 34.0 pg   MCHC 33.3 30.0 - 36.0 g/dL   RDW 13.1 11.5 - 15.5 %   Platelets 265 150 - 400 K/uL   nRBC 0.0 0.0 - 0.2 %   Neutrophils Relative % 43 %   Neutro Abs 4.4 1.7 - 7.7 K/uL   Lymphocytes Relative 41 %   Lymphs Abs 4.3 (H) 0.7 - 4.0 K/uL   Monocytes Relative 13 %   Monocytes Absolute 1.3 (H) 0.1 - 1.0 K/uL  Eosinophils Relative 1 %   Eosinophils Absolute 0.1 0.0 - 0.5 K/uL   Basophils Relative 0 %   Basophils Absolute 0.0 0.0 - 0.1 K/uL   Immature Granulocytes 2 %   Abs Immature Granulocytes 0.16 (H) 0.00 - 0.07 K/uL  Basic metabolic panel  Result Value Ref Range   Sodium 138 135 - 145 mmol/L   Potassium 3.8 3.5 - 5.1 mmol/L   Chloride 112 (H) 98 - 111 mmol/L   CO2 21 (L) 22 - 32 mmol/L   Glucose, Bld 158 (H) 70 - 99 mg/dL   BUN 20 8 - 23 mg/dL   Creatinine, Ser 1.55 (H) 0.44 - 1.00 mg/dL   Calcium 7.7 (L) 8.9 - 10.3 mg/dL   GFR, Estimated 32 (L) >60 mL/min   Anion gap 5 5 - 15  Magnesium  Result Value Ref Range   Magnesium 2.0 1.7 - 2.4 mg/dL  Glucose, capillary  Result Value Ref Range   Glucose-Capillary 105 (H) 70 - 99 mg/dL   Comment 1 Notify RN   Glucose, capillary  Result Value Ref Range   Glucose-Capillary 125 (H) 70 - 99 mg/dL   Comment 1 Notify RN   Glucose, capillary  Result Value Ref Range   Glucose-Capillary 180 (H) 70 - 99 mg/dL   Comment 1 Notify RN   Glucose, capillary  Result Value Ref Range   Glucose-Capillary 141 (H)  70 - 99 mg/dL   Comment 1 Notify RN   CBC with Differential/Platelet  Result Value Ref Range   WBC 10.1 4.0 - 10.5 K/uL   RBC 3.39 (L) 3.87 - 5.11 MIL/uL   Hemoglobin 10.0 (L) 12.0 - 15.0 g/dL   HCT 30.8 (L) 36.0 - 46.0 %   MCV 90.9 80.0 - 100.0 fL   MCH 29.5 26.0 - 34.0 pg   MCHC 32.5 30.0 - 36.0 g/dL   RDW 13.4 11.5 - 15.5 %   Platelets 307 150 - 400 K/uL   nRBC 0.0 0.0 - 0.2 %   Neutrophils Relative % 44 %   Neutro Abs 4.4 1.7 - 7.7 K/uL   Lymphocytes Relative 44 %   Lymphs Abs 4.5 (H) 0.7 - 4.0 K/uL   Monocytes Relative 9 %   Monocytes Absolute 0.9 0.1 - 1.0 K/uL   Eosinophils Relative 1 %   Eosinophils Absolute 0.1 0.0 - 0.5 K/uL   Basophils Relative 0 %   Basophils Absolute 0.0 0.0 - 0.1 K/uL   Immature Granulocytes 2 %   Abs Immature Granulocytes 0.16 (H) 0.00 - 0.07 K/uL  Basic metabolic panel  Result Value Ref Range   Sodium 140 135 - 145 mmol/L   Potassium 4.0 3.5 - 5.1 mmol/L   Chloride 112 (H) 98 - 111 mmol/L   CO2 22 22 - 32 mmol/L   Glucose, Bld 109 (H) 70 - 99 mg/dL   BUN 14 8 - 23 mg/dL   Creatinine, Ser 1.52 (H) 0.44 - 1.00 mg/dL   Calcium 8.1 (L) 8.9 - 10.3 mg/dL   GFR, Estimated 33 (L) >60 mL/min   Anion gap 6 5 - 15  Magnesium  Result Value Ref Range   Magnesium 1.9 1.7 - 2.4 mg/dL  Phosphorus  Result Value Ref Range   Phosphorus 4.1 2.5 - 4.6 mg/dL  Glucose, capillary  Result Value Ref Range   Glucose-Capillary 106 (H) 70 - 99 mg/dL  Glucose, capillary  Result Value Ref Range   Glucose-Capillary 102 (H) 70 - 99 mg/dL  Glucose,  capillary  Result Value Ref Range   Glucose-Capillary 154 (H) 70 - 99 mg/dL  Glucose, capillary  Result Value Ref Range   Glucose-Capillary 75 70 - 99 mg/dL  CBC with Differential/Platelet  Result Value Ref Range   WBC 11.2 (H) 4.0 - 10.5 K/uL   RBC 3.34 (L) 3.87 - 5.11 MIL/uL   Hemoglobin 10.1 (L) 12.0 - 15.0 g/dL   HCT 30.7 (L) 36.0 - 46.0 %   MCV 91.9 80.0 - 100.0 fL   MCH 30.2 26.0 - 34.0 pg   MCHC 32.9  30.0 - 36.0 g/dL   RDW 13.5 11.5 - 15.5 %   Platelets 326 150 - 400 K/uL   nRBC 0.0 0.0 - 0.2 %   Neutrophils Relative % 47 %   Neutro Abs 5.2 1.7 - 7.7 K/uL   Lymphocytes Relative 42 %   Lymphs Abs 4.7 (H) 0.7 - 4.0 K/uL   Monocytes Relative 9 %   Monocytes Absolute 1.0 0.1 - 1.0 K/uL   Eosinophils Relative 1 %   Eosinophils Absolute 0.2 0.0 - 0.5 K/uL   Basophils Relative 0 %   Basophils Absolute 0.0 0.0 - 0.1 K/uL   Immature Granulocytes 1 %   Abs Immature Granulocytes 0.11 (H) 0.00 - 0.07 K/uL  Basic metabolic panel  Result Value Ref Range   Sodium 139 135 - 145 mmol/L   Potassium 4.2 3.5 - 5.1 mmol/L   Chloride 112 (H) 98 - 111 mmol/L   CO2 21 (L) 22 - 32 mmol/L   Glucose, Bld 88 70 - 99 mg/dL   BUN 13 8 - 23 mg/dL   Creatinine, Ser 1.62 (H) 0.44 - 1.00 mg/dL   Calcium 8.0 (L) 8.9 - 10.3 mg/dL   GFR, Estimated 31 (L) >60 mL/min   Anion gap 6 5 - 15  Magnesium  Result Value Ref Range   Magnesium 2.1 1.7 - 2.4 mg/dL  Phosphorus  Result Value Ref Range   Phosphorus 3.7 2.5 - 4.6 mg/dL  Glucose, capillary  Result Value Ref Range   Glucose-Capillary 121 (H) 70 - 99 mg/dL  Glucose, capillary  Result Value Ref Range   Glucose-Capillary 94 70 - 99 mg/dL  Glucose, capillary  Result Value Ref Range   Glucose-Capillary 135 (H) 70 - 99 mg/dL  Glucose, capillary  Result Value Ref Range   Glucose-Capillary 113 (H) 70 - 99 mg/dL  CBC with Differential/Platelet  Result Value Ref Range   WBC 11.7 (H) 4.0 - 10.5 K/uL   RBC 3.38 (L) 3.87 - 5.11 MIL/uL   Hemoglobin 10.2 (L) 12.0 - 15.0 g/dL   HCT 30.7 (L) 36.0 - 46.0 %   MCV 90.8 80.0 - 100.0 fL   MCH 30.2 26.0 - 34.0 pg   MCHC 33.2 30.0 - 36.0 g/dL   RDW 13.5 11.5 - 15.5 %   Platelets 335 150 - 400 K/uL   nRBC 0.0 0.0 - 0.2 %   Neutrophils Relative % 43 %   Neutro Abs 5.1 1.7 - 7.7 K/uL   Lymphocytes Relative 47 %   Lymphs Abs 5.4 (H) 0.7 - 4.0 K/uL   Monocytes Relative 8 %   Monocytes Absolute 1.0 0.1 - 1.0 K/uL    Eosinophils Relative 1 %   Eosinophils Absolute 0.1 0.0 - 0.5 K/uL   Basophils Relative 0 %   Basophils Absolute 0.0 0.0 - 0.1 K/uL   Immature Granulocytes 1 %   Abs Immature Granulocytes 0.13 (H) 0.00 - 0.07 K/uL  Basic metabolic panel  Result Value Ref Range   Sodium 140 135 - 145 mmol/L   Potassium 4.1 3.5 - 5.1 mmol/L   Chloride 109 98 - 111 mmol/L   CO2 24 22 - 32 mmol/L   Glucose, Bld 94 70 - 99 mg/dL   BUN 12 8 - 23 mg/dL   Creatinine, Ser 1.66 (H) 0.44 - 1.00 mg/dL   Calcium 7.9 (L) 8.9 - 10.3 mg/dL   GFR, Estimated 30 (L) >60 mL/min   Anion gap 7 5 - 15  Magnesium  Result Value Ref Range   Magnesium 1.7 1.7 - 2.4 mg/dL  Glucose, capillary  Result Value Ref Range   Glucose-Capillary 127 (H) 70 - 99 mg/dL   Comment 1 Notify RN   Glucose, capillary  Result Value Ref Range   Glucose-Capillary 110 (H) 70 - 99 mg/dL    This visit occurred during the SARS-CoV-2 public health emergency.  Safety protocols were in place, including screening questions prior to the visit, additional usage of staff PPE, and extensive cleaning of exam room while observing appropriate contact time as indicated for disinfecting solutions.   COVID 19 screen:  No recent travel or known exposure to COVID19 The patient denies respiratory symptoms of COVID 19 at this time. The importance of social distancing was discussed today.   Assessment and Plan    Problem List Items Addressed This Visit    C. difficile diarrhea - Primary    Rest and fluids.  Please stop at the lab to have labs drawn.  If wbc still elevated will repeat course of vancomycin.       Relevant Orders   Basic metabolic panel (Completed)   CBC with Differential/Platelet (Completed)   Gout involving toe of right foot      Using tart cherry for gout flare, using topical voltaren gel.      Hypertension associated with diabetes ([place])    Follow BP daily at home...  calll in 1  Week.  Hold midodrine unless low. Con continue to  hold amlodipine as well.       Relevant Orders   Basic metabolic panel (Completed)   CBC with Differential/Platelet (Completed)   Lesion of left native kidney   Relevant Orders   Basic metabolic panel (Completed)   CBC with Differential/Platelet (Completed)   Type 2 diabetes mellitus with stage 3 chronic kidney disease, without long-term current use of insulin ([facility])   Relevant Orders   Basic metabolic panel (Completed)   CBC with Differential/Platelet (Completed)    Other Visit Diagnoses    Hypotension, unspecified hypotension type           Eliezer Lofts, MD

## 2020-11-27 NOTE — Patient Instructions (Addendum)
Rest and fluids.  Please stop at the lab to have labs drawn.  If wbc still elevated will repeat course of vancomycin.  Follow BP daily at home...  calll in 1  Week.  Hold midodrine unless low. Con continue to hold amlodipine as well.

## 2020-11-28 LAB — CBC WITH DIFFERENTIAL/PLATELET
Absolute Monocytes: 683 cells/uL (ref 200–950)
Basophils Absolute: 51 cells/uL (ref 0–200)
Basophils Relative: 0.5 %
Eosinophils Absolute: 71 cells/uL (ref 15–500)
Eosinophils Relative: 0.7 %
HCT: 37.3 % (ref 35.0–45.0)
Hemoglobin: 12.1 g/dL (ref 11.7–15.5)
Lymphs Abs: 4325 cells/uL — ABNORMAL HIGH (ref 850–3900)
MCH: 29.2 pg (ref 27.0–33.0)
MCHC: 32.4 g/dL (ref 32.0–36.0)
MCV: 90.1 fL (ref 80.0–100.0)
MPV: 9.5 fL (ref 7.5–12.5)
Monocytes Relative: 6.7 %
Neutro Abs: 5069 cells/uL (ref 1500–7800)
Neutrophils Relative %: 49.7 %
Platelets: 429 10*3/uL — ABNORMAL HIGH (ref 140–400)
RBC: 4.14 10*6/uL (ref 3.80–5.10)
RDW: 12.9 % (ref 11.0–15.0)
Total Lymphocyte: 42.4 %
WBC: 10.2 10*3/uL (ref 3.8–10.8)

## 2020-11-28 LAB — BASIC METABOLIC PANEL
BUN/Creatinine Ratio: 9 (calc) (ref 6–22)
BUN: 16 mg/dL (ref 7–25)
CO2: 29 mmol/L (ref 20–32)
Calcium: 9.1 mg/dL (ref 8.6–10.4)
Chloride: 103 mmol/L (ref 98–110)
Creat: 1.78 mg/dL — ABNORMAL HIGH (ref 0.60–0.88)
Glucose, Bld: 192 mg/dL — ABNORMAL HIGH (ref 65–99)
Potassium: 5.3 mmol/L (ref 3.5–5.3)
Sodium: 140 mmol/L (ref 135–146)

## 2020-12-07 ENCOUNTER — Emergency Department
Admission: EM | Admit: 2020-12-07 | Discharge: 2020-12-07 | Disposition: A | Discharge status: Home / Self Care | Payer: Medicare PPO | Attending: Emergency Medicine | Admitting: Emergency Medicine

## 2020-12-07 ENCOUNTER — Other Ambulatory Visit: Payer: Self-pay

## 2020-12-07 DIAGNOSIS — Z96643 Presence of artificial hip joint, bilateral: Secondary | ICD-10-CM | POA: Diagnosis not present

## 2020-12-07 DIAGNOSIS — E1140 Type 2 diabetes mellitus with diabetic neuropathy, unspecified: Secondary | ICD-10-CM | POA: Insufficient documentation

## 2020-12-07 DIAGNOSIS — E1122 Type 2 diabetes mellitus with diabetic chronic kidney disease: Secondary | ICD-10-CM | POA: Insufficient documentation

## 2020-12-07 DIAGNOSIS — J449 Chronic obstructive pulmonary disease, unspecified: Secondary | ICD-10-CM | POA: Insufficient documentation

## 2020-12-07 DIAGNOSIS — Z87891 Personal history of nicotine dependence: Secondary | ICD-10-CM | POA: Insufficient documentation

## 2020-12-07 DIAGNOSIS — R197 Diarrhea, unspecified: Secondary | ICD-10-CM | POA: Diagnosis present

## 2020-12-07 DIAGNOSIS — N184 Chronic kidney disease, stage 4 (severe): Secondary | ICD-10-CM | POA: Insufficient documentation

## 2020-12-07 DIAGNOSIS — Z79899 Other long term (current) drug therapy: Secondary | ICD-10-CM | POA: Insufficient documentation

## 2020-12-07 DIAGNOSIS — A0472 Enterocolitis due to Clostridium difficile, not specified as recurrent: Secondary | ICD-10-CM | POA: Diagnosis not present

## 2020-12-07 DIAGNOSIS — I130 Hypertensive heart and chronic kidney disease with heart failure and stage 1 through stage 4 chronic kidney disease, or unspecified chronic kidney disease: Secondary | ICD-10-CM | POA: Diagnosis not present

## 2020-12-07 DIAGNOSIS — F0390 Unspecified dementia without behavioral disturbance: Secondary | ICD-10-CM | POA: Diagnosis not present

## 2020-12-07 DIAGNOSIS — I5032 Chronic diastolic (congestive) heart failure: Secondary | ICD-10-CM | POA: Diagnosis not present

## 2020-12-07 LAB — CBC
HCT: 34.5 % — ABNORMAL LOW (ref 36.0–46.0)
Hemoglobin: 11.4 g/dL — ABNORMAL LOW (ref 12.0–15.0)
MCH: 30.2 pg (ref 26.0–34.0)
MCHC: 33 g/dL (ref 30.0–36.0)
MCV: 91.3 fL (ref 80.0–100.0)
Platelets: 110 10*3/uL — ABNORMAL LOW (ref 150–400)
RBC: 3.78 MIL/uL — ABNORMAL LOW (ref 3.87–5.11)
RDW: 13.8 % (ref 11.5–15.5)
WBC: 12.9 10*3/uL — ABNORMAL HIGH (ref 4.0–10.5)
nRBC: 0 % (ref 0.0–0.2)

## 2020-12-07 LAB — COMPREHENSIVE METABOLIC PANEL
ALT: 7 U/L (ref 0–44)
AST: 18 U/L (ref 15–41)
Albumin: 3.1 g/dL — ABNORMAL LOW (ref 3.5–5.0)
Alkaline Phosphatase: 59 U/L (ref 38–126)
Anion gap: 10 (ref 5–15)
BUN: 26 mg/dL — ABNORMAL HIGH (ref 8–23)
CO2: 25 mmol/L (ref 22–32)
Calcium: 8.7 mg/dL — ABNORMAL LOW (ref 8.9–10.3)
Chloride: 100 mmol/L (ref 98–111)
Creatinine, Ser: 1.81 mg/dL — ABNORMAL HIGH (ref 0.44–1.00)
GFR, Estimated: 27 mL/min — ABNORMAL LOW (ref >60)
Glucose, Bld: 129 mg/dL — ABNORMAL HIGH (ref 70–99)
Potassium: 4.1 mmol/L (ref 3.5–5.1)
Sodium: 135 mmol/L (ref 135–145)
Total Bilirubin: 1.1 mg/dL (ref 0.3–1.2)
Total Protein: 6.4 g/dL — ABNORMAL LOW (ref 6.5–8.1)

## 2020-12-07 LAB — LIPASE, BLOOD: Lipase: 21 U/L (ref 11–51)

## 2020-12-07 MED ORDER — FIDAXOMICIN 200 MG PO TABS
200.0000 mg | ORAL_TABLET | Freq: Two times a day (BID) | ORAL | Status: DC
  Administered 2020-12-07: 200 mg via ORAL
  Filled 2020-12-07: qty 1

## 2020-12-07 MED ORDER — DIFICID 200 MG PO TABS
200.0000 mg | ORAL_TABLET | Freq: Two times a day (BID) | ORAL | 0 refills | Status: DC
  Filled 2020-12-08: qty 20, 10d supply, fill #0

## 2020-12-07 MED ORDER — SODIUM CHLORIDE 0.9 % IV BOLUS
1000.0000 mL | Freq: Once | INTRAVENOUS | Status: AC
  Administered 2020-12-07: 1000 mL via INTRAVENOUS

## 2020-12-07 NOTE — Telephone Encounter (Signed)
Per chart review tab pt is at [facility] ED. 

## 2020-12-07 NOTE — ED Notes (Signed)
Attempted IV x2 by this RN and Amy, RN without success. Iv team consult placed.

## 2020-12-07 NOTE — ED Provider Notes (Signed)
[facility] Emergency Department Provider Note  Time seen: 3:35 PM  I have reviewed the triage vital signs and the nursing notes.   HISTORY  Chief Complaint Diarrhea   HPI Jill [name] is a 87 y.o. female with a past medical history of arthritis, diabetes, hypertension, hyperlipidemia, presents to the emergency department for diarrhea and generalized weakness.  According to the patient and daughter she was discharged from the hospital over a week ago after an admission for C. difficile.  Patient finished her vancomycin approximately 7 days ago.  Patient did well for 2 to 3 days and then began having loose stool once again.  Now is having diarrhea 4-5 times per day including accidents/incontinence episodes with a foul smell again consistent with her prior C. difficile infection.  For the past 2 to 3 days the patient has been very weak and has been spending most of the day in bed per daughter.  Has not been eating or drinking much due to the diarrhea that follows anytime she eats or drinks.  No fever.  Denies abdominal pain.  Past Medical History:  Diagnosis Date  . Allergy   . Arthritis    osteoarthritis   . Chronic kidney disease   . Complication of anesthesia    difficult waking   . Depression   . Diabetes mellitus   . Diverticula, colon   . Family history of anesthesia complication    Son is difficult intubation  . Hyperlipidemia   . Hypertension   . Vasovagal syncope 2006   Negative cardiac workup-myoview, echo    Patient Active Problem List   Diagnosis Date Noted  . C. difficile diarrhea 11/14/2020  . Acute pain of left knee 09/29/2020  . Mild dementia ([place]) 07/03/2020  . Acute pain of right shoulder 07/03/2020  . Left foot pain 04/09/2020  . Acute infectious diarrhea 05/21/2019  . Bilateral leg weakness 05/21/2019  . Pulmonary nodule 05/29/2018  . Urinary urgency 02/03/2017  . Bilateral buttock pain 02/02/2017  . Gout involving toe of right  foot 12/23/2016  . [place] intertrigo 10/28/2016  . Stage 2 moderate COPD by GOLD classification ([place]) 09/13/2016  . Type 2 diabetes mellitus with stage 3 chronic kidney disease ([place]) 08/08/2016  . Chronic diastolic congestive heart failure ([place]) 08/08/2016  . Intracranial bleed ([place])   . BPPV (benign paroxysmal positional vertigo) 06/24/2016  . Memory loss 06/24/2016  . Accidental fall 06/24/2016  . Chronic neck pain 03/18/2016  . Neuropathy due to type 2 diabetes mellitus ([place]) 03/18/2016  . Anemia in chronic kidney disease 12/15/2015  . Incomplete bladder emptying 10/15/2015  . Abscess, renal/perirenal 10/15/2015  . Recurrent UTI 10/15/2015  . Lumbar back pain with radiculopathy affecting left lower extremity 05/05/2015  . Lesion of left native kidney 01/28/2015  . Chronic fatigue 01/22/2015  . Chronic idiopathic urticaria 01/17/2015  . S/P hip replacement 05/16/2014  . DNR (do not resuscitate) 02/20/2014  . ALLERGIC RHINITIS 12/17/2008  . Vitamin D deficiency 07/01/2008  . CKD (chronic kidney disease) stage 4, GFR 15-29 ml/min (HCC) 02/11/2008  . DIVERTICULOSIS OF COLON 02/01/2008  . COLONIC POLYPS, ADENOMATOUS 10/19/2006  . Type 2 diabetes mellitus with diabetic neuropathy, unspecified ([place]) 10/19/2006  . Hyperlipidemia 10/19/2006  . OBESITY, NOS 10/19/2006  . Major depression, recurrent ([place]) 10/19/2006  . Hypertension associated with diabetes ([place]) 10/19/2006  . Recurrent urticaria 10/19/2006    Past Surgical History:  Procedure Laterality Date  . ABDOMINAL HYSTERECTOMY  1977   fibroid  .  JOINT REPLACEMENT  2009   rt hip  . TOTAL HIP ARTHROPLASTY Left 05/12/2014   dr Mayer Camel  . TOTAL HIP ARTHROPLASTY Left 05/12/2014   Procedure: TOTAL HIP ARTHROPLASTY;  Surgeon: Kerin Salen, MD;  Location: [place] [facility];  Service: Orthopedics;  Laterality: Left;    Prior to Admission medications   Medication Sig Start Date End Date Taking? Authorizing Provider  amLODipine (NORVASC) 10  MG tablet Take 1 tablet (10 mg total) by mouth daily. Skip dose if SBP <130 mmHg Patient not taking: Reported on 11/27/2020 11/22/20 12/22/20  Val Riles, MD  cetirizine (ZYRTEC) 5 MG tablet Take 1 tablet (5 mg total) by mouth daily. 09/12/20   Jaynee Eagles, PA-C  diclofenac Sodium (VOLTAREN) 1 % GEL Apply 2 g topically 4 (four) times daily. 11/14/20   Volney American, PA-C  donepezil (ARICEPT) 5 MG tablet TAKE 1 TABLET BY MOUTH EVERYDAY AT BEDTIME 10/20/20   Bedsole, Amy E, MD  escitalopram (LEXAPRO) 20 MG tablet TAKE 1 TABLET BY MOUTH EVERY DAY 10/23/20   Bedsole, Amy E, MD  midodrine (PROAMATINE) 5 MG tablet Take 1 tablet (5 mg total) by mouth daily as needed (if SBP <100 and stop Amlodipine). 11/21/20   Val Riles, MD  Multiple Vitamins-Minerals (MULTIVITAMIN WITH MINERALS) tablet Take 1 tablet by mouth daily.    [provider]  TART CHERRY PO Take 1 tablet by mouth in the morning and at bedtime.    [provider]  tiotropium (SPIRIVA HANDIHALER) 18 MCG inhalation capsule INHALE 1 CAPSULE VIA HANDIHALER ONCE DAILY AT THE SAME TIME EVERY DAY 09/16/20   Bedsole, Amy E, MD  Vitamin D, Ergocalciferol, (DRISDOL) 1.25 MG (50000 UNIT) CAPS capsule TAKE 1 CAPSULE (50,000 UNITS TOTAL) BY MOUTH EVERY FRIDAY. 10/16/20   Jinny Sanders, MD    Allergies  Allergen Reactions  . Lovastatin Other (See Comments)    REACTION: leg pain  . Statins Other (See Comments)    REACTION: leg cramps, weakness  . Sulfa Antibiotics Hives and Itching  . Codeine Rash  . Niacin Rash    Flushing also    Family History  Problem Relation Age of Onset  . COPD Brother   . Heart disease Brother   . Diabetes Brother   . Lymphoma Brother   . Alzheimer's disease Brother   . Pancreatic cancer Sister   . Alzheimer's disease Sister   . Heart disease Mother   . Breast cancer Sister   . Leukemia Other   . Kidney disease Neg Hx     Social History Social History   Tobacco Use  . Smoking status: Former  Smoker    Packs/day: 1.00    Years: 45.00    Pack years: 45.00    Types: Cigarettes    Quit date: 06/16/1999    Years since quitting: 21.4  . Smokeless tobacco: Never Used  . Tobacco comment: quit 15 years ago  Substance Use Topics  . Alcohol use: No    Alcohol/week: 0.0 standard drinks  . Drug use: No    Review of Systems Constitutional: Negative for fever. Cardiovascular: Negative for chest pain. Respiratory: Negative for shortness of breath. Gastrointestinal: Negative for abdominal pain.  Positive for diarrhea. Genitourinary: Negative for urinary compaints Musculoskeletal: Negative for musculoskeletal complaints Neurological: Negative for headache All other ROS negative  ____________________________________________   PHYSICAL EXAM:  VITAL SIGNS: ED Triage Vitals  Enc Vitals Group     BP 12/07/20 1501 (!) 157/62     Pulse  Rate 12/07/20 1501 87     Resp 12/07/20 1501 16     Temp 12/07/20 1502 98.7 F (37.1 C)     Temp Source 12/07/20 1501 Oral     SpO2 12/07/20 1501 96 %     Weight 12/07/20 1502 150 lb (68 kg)     Height 12/07/20 1502 5\' 2"  (1.575 m)     Head Circumference --      Peak Flow --      Pain Score 12/07/20 1502 0     Pain Loc --      Pain Edu? --      Excl. in [place]? --    Constitutional: Alert and oriented. Well appearing and in no distress. Eyes: Normal exam ENT      Head: Normocephalic and atraumatic.      Mouth/Throat: Mucous membranes are moist. Cardiovascular: Normal rate, regular rhythm. Respiratory: Normal respiratory effort without tachypnea nor retractions. Breath sounds are clear Gastrointestinal: Soft and nontender. No distention.  Musculoskeletal: Nontender with normal range of motion in all extremities.  Neurologic:  Normal speech and language. No gross focal neurologic deficits Skin:  Skin is warm, dry and intact.  Psychiatric: Mood and affect are normal.   ____________________________________________   INITIAL IMPRESSION /  ASSESSMENT AND PLAN / ED COURSE  Pertinent labs & imaging results that were available during my care of the patient were reviewed by me and considered in my medical decision making (see chart for details).   Patient presents to the emergency department for diarrhea.  Recently diagnosed with C. difficile infection, now with frequent diarrhea once again and incontinence episodes after completing her antibiotics.  Given the complaint of weakness and fatigue we will check labs to evaluate electrolyte status.  We will IV hydrate.  Given the patient's recent C. difficile positive status I do not believe retesting the patient would be of much utility as it will likely remain positive regardless of her current infection status.  Given the patient's frequent episodes of diarrhea we will start the patient on oral Dificid.  We will IV hydrate with awaiting lab results.  Patient states she is feeling better after fluids.  Lab work is largely at the patient's baseline.  Offered to admit the patient however they would strongly wish to go home.  We will place the patient on Dificid 200 twice daily x10 days.  Patient is to follow-up with her doctor in the next 2 to 3 days for recheck.  I discussed return precautions with the patient and granddaughter.  Jill [name] was evaluated in Emergency Department on 12/07/2020 for the symptoms described in the history of present illness. She was evaluated in the context of the global COVID-19 pandemic, which necessitated consideration that the patient might be at risk for infection with the SARS-CoV-2 virus that causes COVID-19. Institutional protocols and algorithms that pertain to the evaluation of patients at risk for COVID-19 are in a state of rapid change based on information released by regulatory bodies including the CDC and federal and state organizations. These policies and algorithms were followed during the patient's care in the  ED.  ____________________________________________   FINAL CLINICAL IMPRESSION(S) / ED DIAGNOSES  Diarrhea C. difficile   Harvest Dark, MD 12/07/20 1818

## 2020-12-07 NOTE — Telephone Encounter (Signed)
[facility] - Client TELEPHONE ADVICE RECORD [facility] Patient Name: Jill [name] LEV ENS Gender: Female DOB: September 01, 1931 Age: 87 Y 73 M Return Phone Number: [phone] (Primary), [phone] (Secondary) Address: City/ State/ Zip: [place] [address] Client [facility] - Client Client Site [facility] - Day Physician Eliezer Lofts - MD Contact Type Call Who Is Calling Patient / Member / Family / Caregiver Call Type Triage / Clinical Caller Name Gwendolyn Lima Return Phone Number [phone] (Secondary) Chief Complaint Weakness, Generalized Reason for Call Symptomatic / Request for [place] states that her mother in law may have c diff. She is weak and she has c diff. Translation No Nurse Assessment Nurse: Donna Christen, RN, Legrand Como Date/Time [name] Time): 12/07/2020 1:26:56 PM Confirm and document reason for call. If symptomatic, describe symptoms. ---Caller states that patient was in hospital for Cdiff 2 weeks ago. Was home on Vancomycin. Diarrhea increasing, confused and refusing to eat. Does the patient have any new or worsening symptoms? ---Yes Will a triage be completed? ---Yes Related visit to physician within the last 2 weeks? ---Yes Does the PT have any chronic conditions? (i.e. diabetes, asthma, this includes High risk factors for pregnancy, etc.) ---Yes List chronic conditions. ---Dm2, COPD Is this a behavioral health or substance abuse call? ---No Guidelines Guideline Title Affirmed Question Affirmed Notes Nurse Date/Time (Eastern Time) Diarrhea Difficult to awaken or acting confused (e.g., disoriented, slurred speech) Donna Christen, RNLegrand Como 12/07/2020 1:28:13 PM Disp. Time [name] Time) Disposition Final User 12/07/2020 1:37:21 PM 911 Outcome Documentation Donna Christen, RN, Legrand Como Reason: Rudy Jew that they will take her in theirself does not want to  call 911 at this time. PLEASE NOTE: All timestamps contained within this report are represented as [place] Standard Time. CONFIDENTIALTY NOTICE: This fax transmission is intended only for the addressee. It contains information that is legally privileged, confidential or otherwise protected from use or disclosure. If you are not the intended recipient, you are strictly prohibited from reviewing, disclosing, copying using or disseminating any of this information or taking any action in reliance on or regarding this information. If you have received this fax in error, please notify us immediately by telephone so that we can arrange for its return to [place]. Phone: [phone], Toll-Free: [phone], Fax: [phone] Page: 2 of 2 Call Id: [id number] 12/07/2020 1:30:13 PM Call EMS 911 Now Yes Donna Christen, RN, Gerome Sam Disagree/Comply Comply Caller Understands Yes PreDisposition InappropriateToAsk Care Advice Given Per Guideline CALL EMS 911 NOW: CARE ADVICE given per Diarrhea (Adult) guideline.

## 2020-12-07 NOTE — ED Triage Notes (Signed)
Pt c/o having watery stools for over 2 weeks, was seen for the same 2 weeks ago and dx with c-diff, states she took the vancomycin rx and had a few days of relief but the sx have returned, pt experiencing generalized weakness

## 2020-12-08 ENCOUNTER — Other Ambulatory Visit (HOSPITAL_COMMUNITY): Payer: Self-pay

## 2020-12-08 ENCOUNTER — Other Ambulatory Visit: Payer: Self-pay

## 2020-12-08 NOTE — Progress Notes (Signed)
Antimicrobial Stewardship - Pharmacy (medication procurement)  Patient with recurrent C. Difficile infection s/p vancomycin PO treatment.  Patient seen and discharged from ED on 4/18 with a script for fidaxomicin.  [facility] [place] [facility] in [place] and would come in 4/20 PM.  [facility] has in stock so [facility] and [facility] to [facility].  Per Stewardship patient advocate, benefit check revealed copay to be $100 ([facility] confirmed copay).    Contacted Patient's granddaughter, [name], to discuss [facility] not having medication and if OK to transfer to [facility] and to discuss copay. Received OK to transfer prescription and confirmed ability to pay for medication.  Directions to pharmacy given and how the medication should be taken.  Doreene Eland, PharmD, BCPS.   Work Cell: [phone] 12/08/2020 10:15 AM

## 2020-12-10 ENCOUNTER — Ambulatory Visit: Payer: Medicare PPO | Admitting: Family Medicine

## 2020-12-15 ENCOUNTER — Ambulatory Visit: Payer: Medicare PPO | Admitting: Family Medicine

## 2020-12-18 ENCOUNTER — Other Ambulatory Visit: Payer: Self-pay

## 2020-12-18 ENCOUNTER — Telehealth (INDEPENDENT_AMBULATORY_CARE_PROVIDER_SITE_OTHER): Payer: Medicare PPO | Admitting: Family Medicine

## 2020-12-18 VITALS — BP 154/73 | HR 75

## 2020-12-18 DIAGNOSIS — N184 Chronic kidney disease, stage 4 (severe): Secondary | ICD-10-CM

## 2020-12-18 DIAGNOSIS — E1122 Type 2 diabetes mellitus with diabetic chronic kidney disease: Secondary | ICD-10-CM | POA: Diagnosis not present

## 2020-12-18 DIAGNOSIS — E1159 Type 2 diabetes mellitus with other circulatory complications: Secondary | ICD-10-CM

## 2020-12-18 DIAGNOSIS — I152 Hypertension secondary to endocrine disorders: Secondary | ICD-10-CM

## 2020-12-18 DIAGNOSIS — A0472 Enterocolitis due to Clostridium difficile, not specified as recurrent: Secondary | ICD-10-CM

## 2020-12-18 DIAGNOSIS — N183 Chronic kidney disease, stage 3 unspecified: Secondary | ICD-10-CM

## 2020-12-18 NOTE — Assessment & Plan Note (Signed)
They have not been following CBGs at home... but no symptom of highs or lows.. Will start checking. Reviewed goals.

## 2020-12-18 NOTE — Progress Notes (Signed)
VIRTUAL VISIT Due to national recommendations of social distancing due to [place] 19, a virtual visit is felt to be most appropriate for this patient at this time.   I connected with the patient on 12/18/20 at 11:40 AM EDT by virtual telehealth platform and verified that I am speaking with the correct person using two identifiers.   I discussed the limitations, risks, security and privacy concerns of performing an evaluation and management service by  virtual telehealth platform and the availability of in person appointments. I also discussed with the patient that there may be a patient responsible charge related to this service. The patient expressed understanding and agreed to proceed.  Patient location: Home Provider Location: [facility] Participants: Eliezer Lofts and [name]   Chief Complaint  Patient presents with  . ER follow-up    C Diff    History of Present Illness:  87 year old female with c.difficile colitis hospitalization 3/22  presents following  Repeat ER visit on 4/18 with worsening diarrhea and dehydration. She an her son report that she had completed her course of vancomycin and 2-3 days later had recurrence of diarrhea.   In the ER: Given IVF  Lab work was unremarkable.  Started on Dificid 200 twice daily x10 days.  Today she reports she finished the second antibiotic. She is having one bowel movement a day.. normal consistency. No  Fevers, abdominal pain.  She is eating and drinking better.  She is still fatigued with decreased energy. She is moving more now but still weak.  Not checking blood sugar.  [facility] ED note in detail  Reviewed labs from 12/07/2020 in detail... CBG was 129, GFR 27.   COVID 19 screen No recent travel or known exposure to COVID19 The patient denies respiratory symptoms of COVID 19 at this time.  The importance of social distancing was discussed today.   Review of Systems  Constitutional: Negative for chills and  fever.  HENT: Negative for congestion and ear pain.   Eyes: Negative for pain and redness.  Respiratory: Negative for cough and shortness of breath.   Cardiovascular: Negative for chest pain, palpitations and leg swelling.  Gastrointestinal: Negative for abdominal pain, blood in stool, constipation, diarrhea, nausea and vomiting.  Genitourinary: Negative for dysuria.  Musculoskeletal: Negative for falls and myalgias.  Skin: Negative for rash.  Neurological: Negative for dizziness.  Psychiatric/Behavioral: Negative for depression. The patient is not nervous/anxious.       Past Medical History:  Diagnosis Date  . Allergy   . Arthritis    osteoarthritis   . Chronic kidney disease   . Complication of anesthesia    difficult waking   . Depression   . Diabetes mellitus   . Diverticula, colon   . Family history of anesthesia complication    Son is difficult intubation  . Hyperlipidemia   . Hypertension   . Vasovagal syncope 2006   Negative cardiac workup-myoview, echo    reports that she quit smoking about 21 years ago. Her smoking use included cigarettes. She has a 45.00 pack-year smoking history. She has never used smokeless tobacco. She reports that she does not drink alcohol and does not use drugs.   Current Outpatient Medications:  .  cetirizine (ZYRTEC) 5 MG tablet, Take 1 tablet (5 mg total) by mouth daily., Disp: 90 tablet, Rfl: 0 .  diclofenac Sodium (VOLTAREN) 1 % GEL, Apply 2 g topically 4 (four) times daily., Disp: 100 g, Rfl: 1 .  donepezil (  ARICEPT) 5 MG tablet, TAKE 1 TABLET BY MOUTH EVERYDAY AT BEDTIME, Disp: 90 tablet, Rfl: 0 .  escitalopram (LEXAPRO) 20 MG tablet, TAKE 1 TABLET BY MOUTH EVERY DAY, Disp: 90 tablet, Rfl: 1 .  fidaxomicin (DIFICID) 200 MG TABS tablet, Take 1 tablet (200 mg total) by mouth 2 (two) times daily., Disp: 20 tablet, Rfl: 0 .  midodrine (PROAMATINE) 5 MG tablet, Take 1 tablet (5 mg total) by mouth daily as needed (if SBP <100 and stop  Amlodipine)., Disp: , Rfl:  .  Multiple Vitamins-Minerals (MULTIVITAMIN WITH MINERALS) tablet, Take 1 tablet by mouth daily., Disp: , Rfl:  .  TART CHERRY PO, Take 1 tablet by mouth in the morning and at bedtime., Disp: , Rfl:  .  tiotropium (SPIRIVA HANDIHALER) 18 MCG inhalation capsule, INHALE 1 CAPSULE VIA HANDIHALER ONCE DAILY AT THE SAME TIME EVERY DAY, Disp: 30 capsule, Rfl: 5 .  Vitamin D, Ergocalciferol, (DRISDOL) 1.25 MG (50000 UNIT) CAPS capsule, TAKE 1 CAPSULE (50,000 UNITS TOTAL) BY MOUTH EVERY FRIDAY., Disp: 12 capsule, Rfl: 3 .  amLODipine (NORVASC) 10 MG tablet, Take 1 tablet (10 mg total) by mouth daily. Skip dose if SBP <130 mmHg (Patient not taking: No sig reported), Disp: 30 tablet, Rfl: 0   Observations/Objective: Blood pressure (!) 154/73, pulse 75.  Physical Exam  Physical Exam Constitutional:      General: The patient is not in acute distress. Pulmonary:     Effort: Pulmonary effort is normal. No respiratory distress.  Neurological:     Mental Status: The patient is alert and oriented to person, place, and time.  Psychiatric:        Mood and Affect: Mood normal.        Behavior: Behavior normal.   Assessment and Plan    Problem List Items Addressed This Visit    C. difficile diarrhea - Primary    Recurrence resolved after Dificid course.  She is having good po intake and increasing energy.   Encouraged home PT... she reuses referral to [facility]. She will work on home strengthening exercises with her family caregivers.      CKD (chronic kidney disease) stage 4, GFR 15-29 ml/min (HCC)    Was at baseline 1.6-2 at ER visit.Marland Kitchen encouraged pt to keep up po intake.      Hypertension associated with diabetes ([place])    Borderline today.. will follow at home.      Type 2 diabetes mellitus with stage 3 chronic kidney disease, without long-term current use of insulin ([place])    They have not been following CBGs at home... but no symptom of highs or lows.. Will start  checking. Reviewed goals.         I discussed the assessment and treatment plan with the patient. The patient was provided an opportunity to ask questions and all were answered. The patient agreed with the plan and demonstrated an understanding of the instructions.   The patient was advised to call back or seek an in-person evaluation if the symptoms worsen or if the condition fails to improve as anticipated.     Eliezer Lofts, MD

## 2020-12-18 NOTE — Assessment & Plan Note (Signed)
Recurrence resolved after Dificid course.  She is having good po intake and increasing energy.   Encouraged home PT... she reuses referral to [facility]. She will work on home strengthening exercises with her family caregivers.

## 2020-12-18 NOTE — Assessment & Plan Note (Signed)
Was at baseline 1.6-2 at ER visit.Marland Kitchen encouraged pt to keep up po intake.

## 2020-12-18 NOTE — Assessment & Plan Note (Signed)
Borderline today.. will follow at home.

## 2020-12-30 NOTE — Assessment & Plan Note (Signed)
Rest and fluids.  Please stop at the lab to have labs drawn.  If wbc still elevated will repeat course of vancomycin.

## 2020-12-30 NOTE — Assessment & Plan Note (Signed)
   Using tart cherry for gout flare, using topical voltaren gel.

## 2020-12-30 NOTE — Assessment & Plan Note (Signed)
Follow BP daily at home...  calll in 1  Week.  Hold midodrine unless low. Con continue to hold amlodipine as well.

## 2021-01-08 ENCOUNTER — Ambulatory Visit (INDEPENDENT_AMBULATORY_CARE_PROVIDER_SITE_OTHER)
Admission: RE | Admit: 2021-01-08 | Discharge: 2021-01-08 | Disposition: A | Discharge status: Home / Self Care | Payer: Medicare PPO | Source: Ambulatory Visit | Attending: Family Medicine | Admitting: Family Medicine

## 2021-01-08 ENCOUNTER — Other Ambulatory Visit: Payer: Self-pay | Admitting: Family Medicine

## 2021-01-08 ENCOUNTER — Other Ambulatory Visit: Payer: Self-pay

## 2021-01-08 ENCOUNTER — Ambulatory Visit: Payer: Medicare PPO | Admitting: Family Medicine

## 2021-01-08 VITALS — BP 150/76 | HR 78 | Temp 97.6°F | Ht 62.0 in | Wt 159.0 lb

## 2021-01-08 DIAGNOSIS — M47816 Spondylosis without myelopathy or radiculopathy, lumbar region: Secondary | ICD-10-CM | POA: Diagnosis not present

## 2021-01-08 DIAGNOSIS — W19XXXA Unspecified fall, initial encounter: Secondary | ICD-10-CM

## 2021-01-08 DIAGNOSIS — R0789 Other chest pain: Secondary | ICD-10-CM

## 2021-01-08 DIAGNOSIS — S0990XA Unspecified injury of head, initial encounter: Secondary | ICD-10-CM

## 2021-01-08 DIAGNOSIS — Z96641 Presence of right artificial hip joint: Secondary | ICD-10-CM | POA: Diagnosis not present

## 2021-01-08 DIAGNOSIS — M25551 Pain in right hip: Secondary | ICD-10-CM

## 2021-01-08 DIAGNOSIS — M25521 Pain in right elbow: Secondary | ICD-10-CM | POA: Diagnosis not present

## 2021-01-08 DIAGNOSIS — R0781 Pleurodynia: Secondary | ICD-10-CM | POA: Diagnosis not present

## 2021-01-08 NOTE — Progress Notes (Signed)
Patient ID: Jill [name], female    DOB: Aug 18, 1932, 87 y.o.   MRN: [id number]  This visit was conducted in person.  BP (!) 150/76   Pulse 78   Temp 97.6 F (36.4 C) (Temporal)   Ht 5\' 2"  (1.575 m)   Wt 159 lb (72.1 kg)   SpO2 94%   BMI 29.08 kg/m    CC:  Chief Complaint  Patient presents with   Elbow Injury    R x 6 days fell in bedroom on hardwood floor    Fall    Today    Chest Pain    "feels muscular" and burping since the fall today     Subjective:   HPI: Jill [name] is a 87 y.o. female presenting on 01/08/2021 for Elbow Injury (R x 6 days fell in bedroom on hardwood floor ), Fall (Today ), and Chest Pain ("feels muscular" and burping since the fall today )  Recent hospitalization c. difficile colitis. Reviewed last discharge note.  She presents following recent all 6 days ago and again a fall on way to office today.  6 days ago she was going to bathroom and feet slipped...landed on right forearm and right elbow. No LOC but did hit her head... pulled herself to  A cough. Right hip also painful, but improved.  No confusion, no neuro changes, no headache  Today on way to office she tripped. Fell flat on back on left side on shoulder balde, no head injury, Since fall today she has pain in left chest wall anterior. No change in breathing. Some increase in burping. No exertion pain.    no proceeding symptoms to either fall.. no dizziness, no weakness    No further diarrhea, improved po intake   Wt Readings from Last 3 Encounters:  01/08/21 159 lb (72.1 kg)  12/07/20 150 lb (68 kg)  11/27/20 156 lb 4 oz (70.9 kg)    Relevant past medical, surgical, family and social history reviewed and updated as indicated. Interim medical history since our last visit reviewed. Allergies and medications reviewed and updated. Outpatient Medications Prior to Visit  Medication Sig Dispense Refill   cetirizine (ZYRTEC) 5 MG tablet Take 1 tablet (5 mg total) by mouth daily.  90 tablet 0   diclofenac Sodium (VOLTAREN) 1 % GEL Apply 2 g topically 4 (four) times daily. 100 g 1   donepezil (ARICEPT) 5 MG tablet TAKE 1 TABLET BY MOUTH EVERYDAY AT BEDTIME 90 tablet 0   escitalopram (LEXAPRO) 20 MG tablet TAKE 1 TABLET BY MOUTH EVERY DAY 90 tablet 1   Multiple Vitamins-Minerals (MULTIVITAMIN WITH MINERALS) tablet Take 1 tablet by mouth daily.     TART CHERRY PO Take 1 tablet by mouth in the morning and at bedtime.     tiotropium (SPIRIVA HANDIHALER) 18 MCG inhalation capsule INHALE 1 CAPSULE VIA HANDIHALER ONCE DAILY AT THE SAME TIME EVERY DAY 30 capsule 5   Vitamin D, Ergocalciferol, (DRISDOL) 1.25 MG (50000 UNIT) CAPS capsule TAKE 1 CAPSULE (50,000 UNITS TOTAL) BY MOUTH EVERY FRIDAY. 12 capsule 3   amLODipine (NORVASC) 10 MG tablet Take 1 tablet (10 mg total) by mouth daily. Skip dose if SBP <130 mmHg (Patient not taking: No sig reported) 30 tablet 0   midodrine (PROAMATINE) 5 MG tablet Take 1 tablet (5 mg total) by mouth daily as needed (if SBP <100 and stop Amlodipine). (Patient not taking: Reported on 01/08/2021)     fidaxomicin (DIFICID) 200 MG TABS tablet  Take 1 tablet (200 mg total) by mouth 2 (two) times daily. 20 tablet 0   No facility-administered medications prior to visit.     Per HPI unless specifically indicated in ROS section below Review of Systems  Constitutional:  Negative for fatigue and fever.  HENT:  Negative for congestion.   Eyes:  Negative for pain.  Respiratory:  Negative for cough and shortness of breath.   Cardiovascular:  Negative for chest pain, palpitations and leg swelling.  Gastrointestinal:  Negative for abdominal pain.  Genitourinary:  Negative for dysuria and vaginal bleeding.  Musculoskeletal:  Negative for back pain.  Neurological:  Negative for syncope, light-headedness and headaches.  Psychiatric/Behavioral:  Negative for dysphoric mood.   Objective:  BP (!) 150/76   Pulse 78   Temp 97.6 F (36.4 C) (Temporal)   Ht 5\' 2"   (1.575 m)   Wt 159 lb (72.1 kg)   SpO2 94%   BMI 29.08 kg/m   Wt Readings from Last 3 Encounters:  01/08/21 159 lb (72.1 kg)  12/07/20 150 lb (68 kg)  11/27/20 156 lb 4 oz (70.9 kg)      Physical Exam Constitutional:      General: She is not in acute distress.    Appearance: Normal appearance. She is well-developed. She is not ill-appearing or toxic-appearing.  HENT:     Head: Normocephalic.     Right Ear: Hearing, tympanic membrane, ear canal and external ear normal. Tympanic membrane is not erythematous, retracted or bulging.     Left Ear: Hearing, tympanic membrane, ear canal and external ear normal. Tympanic membrane is not erythematous, retracted or bulging.     Nose: No mucosal edema or rhinorrhea.     Right Sinus: No maxillary sinus tenderness or frontal sinus tenderness.     Left Sinus: No maxillary sinus tenderness or frontal sinus tenderness.     Mouth/Throat:     Pharynx: Uvula midline.  Eyes:     General: Lids are normal. Lids are everted, no foreign bodies appreciated.     Conjunctiva/sclera: Conjunctivae normal.     Pupils: Pupils are equal, round, and reactive to light.  Neck:     Thyroid: No thyroid mass or thyromegaly.     Vascular: No carotid bruit.     Trachea: Trachea normal.  Cardiovascular:     Rate and Rhythm: Normal rate and regular rhythm.     Pulses: Normal pulses.     Heart sounds: Normal heart sounds, S1 normal and S2 normal. No murmur heard.   No friction rub. No gallop.  Pulmonary:     Effort: Pulmonary effort is normal. No tachypnea or respiratory distress.     Breath sounds: Normal breath sounds. No decreased breath sounds, wheezing, rhonchi or rales.  Abdominal:     General: Bowel sounds are normal.     Palpations: Abdomen is soft.     Tenderness: There is no abdominal tenderness.  Musculoskeletal:     Cervical back: Normal range of motion and neck supple.     Comments: TTP over right elbow, normal ROM Chest wall ttp  left anterior, no  bruising  Skin:    General: Skin is warm and dry.     Findings: No rash.  Neurological:     Mental Status: She is alert.  Psychiatric:        Mood and Affect: Mood is not anxious or depressed.        Speech: Speech normal.  Behavior: Behavior normal. Behavior is cooperative.        Thought Content: Thought content normal.        Judgment: Judgment normal.      Results for orders placed or performed during the hospital encounter of 12/07/20  Lipase, blood  Result Value Ref Range   Lipase 21 11 - 51 U/L  Comprehensive metabolic panel  Result Value Ref Range   Sodium 135 135 - 145 mmol/L   Potassium 4.1 3.5 - 5.1 mmol/L   Chloride 100 98 - 111 mmol/L   CO2 25 22 - 32 mmol/L   Glucose, Bld 129 (H) 70 - 99 mg/dL   BUN 26 (H) 8 - 23 mg/dL   Creatinine, Ser 1.81 (H) 0.44 - 1.00 mg/dL   Calcium 8.7 (L) 8.9 - 10.3 mg/dL   Total Protein 6.4 (L) 6.5 - 8.1 g/dL   Albumin 3.1 (L) 3.5 - 5.0 g/dL   AST 18 15 - 41 U/L   ALT 7 0 - 44 U/L   Alkaline Phosphatase 59 38 - 126 U/L   Total Bilirubin 1.1 0.3 - 1.2 mg/dL   GFR, Estimated 27 (L) >60 mL/min   Anion gap 10 5 - 15  CBC  Result Value Ref Range   WBC 12.9 (H) 4.0 - 10.5 K/uL   RBC 3.78 (L) 3.87 - 5.11 MIL/uL   Hemoglobin 11.4 (L) 12.0 - 15.0 g/dL   HCT 34.5 (L) 36.0 - 46.0 %   MCV 91.3 80.0 - 100.0 fL   MCH 30.2 26.0 - 34.0 pg   MCHC 33.0 30.0 - 36.0 g/dL   RDW 13.8 11.5 - 15.5 %   Platelets 110 (L) 150 - 400 K/uL   nRBC 0.0 0.0 - 0.2 %    This visit occurred during the SARS-CoV-2 public health emergency.  Safety protocols were in place, including screening questions prior to the visit, additional usage of staff PPE, and extensive cleaning of exam room while observing appropriate contact time as indicated for disinfecting solutions.   COVID 19 screen:  No recent travel or known exposure to COVID19 The patient denies respiratory symptoms of COVID 19 at this time. The importance of social distancing was discussed today.    Assessment and Plan Problem List Items Addressed This Visit   None Visit Diagnoses     Left-sided chest wall pain    -  Primary   Acute right hip pain       Relevant Orders   DG Hip Unilat W OR W/O Pelvis 2-3 Views Right (Completed)   Right elbow pain       Fall, accidental, initial encounter       Relevant Orders   DG Hip Unilat W OR W/O Pelvis 2-3 Views Right (Completed)   Traumatic injury of head, initial encounter           No indication for CT head at this time, but recommended close follow up up and ER precautions given. Eval with X-rays.  Ice on scalp swelling.  Apply volatren gel to right elbow.  Tylenol extra strength twice daily.  Frequent falls: Work on fall precautions at home and use assistive devices. Wear safer shoes.    Eliezer Lofts, MD

## 2021-01-08 NOTE — Patient Instructions (Addendum)
Ice on scalp swelling.  Apply volatren gel to right elbow.  Tylenol extra strength twice daily.  We will call with X-ray results.  Work on fall precautions at home and use assistive devices. Wear safer shoes.

## 2021-01-10 DIAGNOSIS — M25512 Pain in left shoulder: Secondary | ICD-10-CM | POA: Diagnosis not present

## 2021-01-10 DIAGNOSIS — M25552 Pain in left hip: Secondary | ICD-10-CM | POA: Diagnosis not present

## 2021-01-11 ENCOUNTER — Telehealth: Payer: Self-pay | Admitting: Family Medicine

## 2021-01-11 NOTE — Telephone Encounter (Signed)
Patients family member called in stating that they took her to [facility] and she has fractured hip. Patients family is not very happy that they have not heard anything back about her results from her xray here. Please call the patient back to discuss. EM

## 2021-01-11 NOTE — Telephone Encounter (Signed)
Spoke with Estill Bamberg and advised her that Dr. Diona Browner has not reviewed the results yet but that per x-ray reports on 01/08/2021 there was no acute bony abnormality noted on either x-rays.  FYI to Dr. Diona Browner.

## 2021-01-11 NOTE — Telephone Encounter (Signed)
Patient was called and spoke with her daughter. She is aware of the Xrays being clear and is waiting for Dr. Rometta Emery phone call tomorrow.

## 2021-01-11 NOTE — Telephone Encounter (Signed)
Let family know, I will call pt tommorow, but per X-ray they were clear per radiology.

## 2021-01-12 NOTE — Telephone Encounter (Signed)
Call and spoke with granddaughter.  Pain began in left hip over weekend and right leg was imaged as this was where patient was having pain at the time of the office visit. Frontal view reviewed and possible fracture missed on that view.. will have radiologist addend with update if fracture seen.  Patients pain is well controlled  on tramadol and she is able to weight bear.  She has follow up with Ortho on Friday.

## 2021-01-14 ENCOUNTER — Telehealth: Payer: Self-pay

## 2021-01-14 ENCOUNTER — Emergency Department
Admission: EM | Admit: 2021-01-14 | Discharge: 2021-01-14 | Disposition: A | Discharge status: Home / Self Care | Payer: Medicare PPO | Attending: Emergency Medicine | Admitting: Emergency Medicine

## 2021-01-14 ENCOUNTER — Other Ambulatory Visit: Payer: Self-pay

## 2021-01-14 DIAGNOSIS — Z87891 Personal history of nicotine dependence: Secondary | ICD-10-CM | POA: Diagnosis not present

## 2021-01-14 DIAGNOSIS — E1140 Type 2 diabetes mellitus with diabetic neuropathy, unspecified: Secondary | ICD-10-CM | POA: Insufficient documentation

## 2021-01-14 DIAGNOSIS — F0390 Unspecified dementia without behavioral disturbance: Secondary | ICD-10-CM | POA: Insufficient documentation

## 2021-01-14 DIAGNOSIS — E860 Dehydration: Secondary | ICD-10-CM | POA: Insufficient documentation

## 2021-01-14 DIAGNOSIS — Z79899 Other long term (current) drug therapy: Secondary | ICD-10-CM | POA: Diagnosis not present

## 2021-01-14 DIAGNOSIS — E1122 Type 2 diabetes mellitus with diabetic chronic kidney disease: Secondary | ICD-10-CM | POA: Insufficient documentation

## 2021-01-14 DIAGNOSIS — I129 Hypertensive chronic kidney disease with stage 1 through stage 4 chronic kidney disease, or unspecified chronic kidney disease: Secondary | ICD-10-CM | POA: Insufficient documentation

## 2021-01-14 DIAGNOSIS — J449 Chronic obstructive pulmonary disease, unspecified: Secondary | ICD-10-CM | POA: Insufficient documentation

## 2021-01-14 DIAGNOSIS — M25512 Pain in left shoulder: Secondary | ICD-10-CM | POA: Diagnosis not present

## 2021-01-14 DIAGNOSIS — N184 Chronic kidney disease, stage 4 (severe): Secondary | ICD-10-CM | POA: Diagnosis not present

## 2021-01-14 DIAGNOSIS — Z96643 Presence of artificial hip joint, bilateral: Secondary | ICD-10-CM | POA: Insufficient documentation

## 2021-01-14 LAB — URINALYSIS, COMPLETE (UACMP) WITH MICROSCOPIC
Bacteria, UA: NONE SEEN
Bilirubin Urine: NEGATIVE
Glucose, UA: NEGATIVE mg/dL
Hgb urine dipstick: NEGATIVE
Ketones, ur: NEGATIVE mg/dL
Nitrite: NEGATIVE
Protein, ur: 30 mg/dL — AB
Specific Gravity, Urine: 1.014 (ref 1.005–1.030)
pH: 5 (ref 5.0–8.0)

## 2021-01-14 LAB — CBC WITH DIFFERENTIAL/PLATELET
Abs Immature Granulocytes: 0.36 10*3/uL — ABNORMAL HIGH (ref 0.00–0.07)
Basophils Absolute: 0 10*3/uL (ref 0.0–0.1)
Basophils Relative: 0 %
Eosinophils Absolute: 0.2 10*3/uL (ref 0.0–0.5)
Eosinophils Relative: 1 %
HCT: 35.9 % — ABNORMAL LOW (ref 36.0–46.0)
Hemoglobin: 11.3 g/dL — ABNORMAL LOW (ref 12.0–15.0)
Immature Granulocytes: 3 %
Lymphocytes Relative: 28 %
Lymphs Abs: 3.5 10*3/uL (ref 0.7–4.0)
MCH: 29.8 pg (ref 26.0–34.0)
MCHC: 31.5 g/dL (ref 30.0–36.0)
MCV: 94.7 fL (ref 80.0–100.0)
Monocytes Absolute: 2.2 10*3/uL — ABNORMAL HIGH (ref 0.1–1.0)
Monocytes Relative: 17 %
Neutro Abs: 6.4 10*3/uL (ref 1.7–7.7)
Neutrophils Relative %: 51 %
Platelets: 216 10*3/uL (ref 150–400)
RBC: 3.79 MIL/uL — ABNORMAL LOW (ref 3.87–5.11)
RDW: 14.4 % (ref 11.5–15.5)
WBC: 12.7 10*3/uL — ABNORMAL HIGH (ref 4.0–10.5)
nRBC: 0 % (ref 0.0–0.2)

## 2021-01-14 LAB — COMPREHENSIVE METABOLIC PANEL
ALT: 10 U/L (ref 0–44)
AST: 17 U/L (ref 15–41)
Albumin: 3.6 g/dL (ref 3.5–5.0)
Alkaline Phosphatase: 76 U/L (ref 38–126)
Anion gap: 8 (ref 5–15)
BUN: 39 mg/dL — ABNORMAL HIGH (ref 8–23)
CO2: 25 mmol/L (ref 22–32)
Calcium: 9 mg/dL (ref 8.9–10.3)
Chloride: 105 mmol/L (ref 98–111)
Creatinine, Ser: 2.07 mg/dL — ABNORMAL HIGH (ref 0.44–1.00)
GFR, Estimated: 23 mL/min — ABNORMAL LOW (ref >60)
Glucose, Bld: 141 mg/dL — ABNORMAL HIGH (ref 70–99)
Potassium: 4.5 mmol/L (ref 3.5–5.1)
Sodium: 138 mmol/L (ref 135–145)
Total Bilirubin: 1.2 mg/dL (ref 0.3–1.2)
Total Protein: 7 g/dL (ref 6.5–8.1)

## 2021-01-14 MED ORDER — ONDANSETRON 4 MG PO TBDP: 4.0000 mg | ORAL_TABLET | Freq: Three times a day (TID) | ORAL | 0 refills | Status: AC | PRN

## 2021-01-14 MED ORDER — HYDROCODONE-ACETAMINOPHEN 5-325 MG PO TABS
1.0000 | ORAL_TABLET | Freq: Once | ORAL | Status: AC
  Administered 2021-01-14: 1 via ORAL
  Filled 2021-01-14: qty 1

## 2021-01-14 MED ORDER — ONDANSETRON 4 MG PO TBDP
4.0000 mg | ORAL_TABLET | Freq: Once | ORAL | Status: AC
  Administered 2021-01-14: 4 mg via ORAL
  Filled 2021-01-14: qty 1

## 2021-01-14 MED ORDER — HYDROCODONE-ACETAMINOPHEN 5-325 MG PO TABS: 2.0000 | ORAL_TABLET | Freq: Four times a day (QID) | ORAL | 0 refills | Status: AC | PRN

## 2021-01-14 MED ORDER — SODIUM CHLORIDE 0.9 % IV BOLUS
500.0000 mL | Freq: Once | INTRAVENOUS | Status: AC
  Administered 2021-01-14: 500 mL via INTRAVENOUS

## 2021-01-14 NOTE — Telephone Encounter (Signed)
Yes per chart review tab pt is at [facility] ED now.

## 2021-01-14 NOTE — Discharge Instructions (Signed)
Please stay hydrated at home. You have been prescribed a short course of Norco for pain. Please stop tramadol.

## 2021-01-14 NOTE — Telephone Encounter (Signed)
Jill [name], granddaughter, called stating today at 4 am patient woke up stating her chest/heart was hurting but pointing to the area of possible fracture she has. They called EMS and per them heart was not the issue, everything looked ok and was advised to follow up with Dr Diona Browner. Per Jill [name] it is something in her shoulder, rib area as was discussed on the phone with Dr Diona Browner on 01/12/21. Patient does have follow up with ortho tomorrow but that is for her hip issue not the other areas. Patient is taking Tramadol and that helps a little but not a lot and does not resolve it, pain still there. Patient is not having any SOB or any distress now. Patient is sleeping at the time of the phone call. We do not have any appointments today or tomorrow.   Please advise on how to proceed. Any other medications to try. Please call Jill [name] back [phone].

## 2021-01-14 NOTE — Telephone Encounter (Signed)
Agree.. patient needs to be seen.. ER is likely best option given complicated history.

## 2021-01-14 NOTE — ED Triage Notes (Signed)
Pt to ED with daughter for possible UTI. Reports frequent UTI's. Has foul smelling urine that started today Daughter reports 2 recent falls but pt has been evaluated for those.

## 2021-01-14 NOTE — ED Provider Notes (Signed)
[facility]-EMERGENCY DEPARTMENT  ____________________________________________  Time seen: Approximately 8:43 PM  I have reviewed the triage vital signs and the nursing notes.   HISTORY  Chief Complaint UTI   Historian Patient     HPI Jill [name] is a 87 y.o. female presents to the emergency department with concern for possible UTI.  Patient had 2 recent falls in the past patient 2 weeks and recently was evaluated by orthopedics and prescribed tramadol.  Since taking tramadol this morning, patient has seemed "not like herself" according to her daughter and her urine has smelled different than it normally does.  She has not been complaining of dysuria, hematuria or increased urinary frequency.  No fever at home.  No complaints of abdominal pain no chest pain, chest tightness or shortness of breath.   Past Medical History:  Diagnosis Date  . Allergy   . Arthritis    osteoarthritis   . Chronic kidney disease   . Complication of anesthesia    difficult waking   . Depression   . Diabetes mellitus   . Diverticula, colon   . Family history of anesthesia complication    Son is difficult intubation  . Hyperlipidemia   . Hypertension   . Vasovagal syncope 2006   Negative cardiac workup-myoview, echo     Immunizations up to date:  Yes.     Past Medical History:  Diagnosis Date  . Allergy   . Arthritis    osteoarthritis   . Chronic kidney disease   . Complication of anesthesia    difficult waking   . Depression   . Diabetes mellitus   . Diverticula, colon   . Family history of anesthesia complication    Son is difficult intubation  . Hyperlipidemia   . Hypertension   . Vasovagal syncope 2006   Negative cardiac workup-myoview, echo    Patient Active Problem List   Diagnosis Date Noted  . C. difficile diarrhea 11/14/2020  . Acute pain of left knee 09/29/2020  . Mild dementia ([place]) 07/03/2020  . Acute pain of right shoulder 07/03/2020  . Left foot pain  04/09/2020  . Acute infectious diarrhea 05/21/2019  . Bilateral leg weakness 05/21/2019  . Pulmonary nodule 05/29/2018  . Urinary urgency 02/03/2017  . Bilateral buttock pain 02/02/2017  . Gout involving toe of right foot 12/23/2016  . [place] intertrigo 10/28/2016  . Stage 2 moderate COPD by GOLD classification ([place]) 09/13/2016  . Type 2 diabetes mellitus with stage 3 chronic kidney disease, without long-term current use of insulin ([place]) 08/08/2016  . Chronic diastolic congestive heart failure ([place]) 08/08/2016  . Intracranial bleed ([place])   . BPPV (benign paroxysmal positional vertigo) 06/24/2016  . Memory loss 06/24/2016  . Accidental fall 06/24/2016  . Chronic neck pain 03/18/2016  . Neuropathy due to type 2 diabetes mellitus ([place]) 03/18/2016  . Anemia in chronic kidney disease 12/15/2015  . Incomplete bladder emptying 10/15/2015  . Abscess, renal/perirenal 10/15/2015  . Recurrent UTI 10/15/2015  . Lumbar back pain with radiculopathy affecting left lower extremity 05/05/2015  . Lesion of left native kidney 01/28/2015  . Chronic fatigue 01/22/2015  . Chronic idiopathic urticaria 01/17/2015  . S/P hip replacement 05/16/2014  . DNR (do not resuscitate) 02/20/2014  . ALLERGIC RHINITIS 12/17/2008  . Vitamin D deficiency 07/01/2008  . CKD (chronic kidney disease) stage 4, GFR 15-29 ml/min ([facility]) 02/11/2008  . DIVERTICULOSIS OF COLON 02/01/2008  . COLONIC POLYPS, ADENOMATOUS 10/19/2006  . Type 2 diabetes mellitus with diabetic neuropathy, unspecified (  [place]) 10/19/2006  . Hyperlipidemia 10/19/2006  . OBESITY, NOS 10/19/2006  . Major depression, recurrent ([place]) 10/19/2006  . Hypertension associated with diabetes ([place]) 10/19/2006  . Recurrent urticaria 10/19/2006    Past Surgical History:  Procedure Laterality Date  . ABDOMINAL HYSTERECTOMY  1977   fibroid  . JOINT REPLACEMENT  2009   rt hip  . TOTAL HIP ARTHROPLASTY Left 05/12/2014   dr Mayer Camel  . TOTAL HIP ARTHROPLASTY Left  05/12/2014   Procedure: TOTAL HIP ARTHROPLASTY;  Surgeon: Kerin Salen, MD;  Location: [place];  Service: Orthopedics;  Laterality: Left;    Prior to Admission medications   Medication Sig Start Date End Date Taking? Authorizing Provider  HYDROcodone-acetaminophen (NORCO) 5-325 MG tablet Take 2 tablets by mouth every 6 (six) hours as needed for up to 3 days for moderate pain. 01/14/21 01/17/21 Yes Vallarie Mare M, PA-C  ondansetron (ZOFRAN ODT) 4 MG disintegrating tablet Take 1 tablet (4 mg total) by mouth every 8 (eight) hours as needed for up to 5 days. 01/14/21 01/19/21 Yes Vallarie Mare M, PA-C  amLODipine (NORVASC) 10 MG tablet Take 1 tablet (10 mg total) by mouth daily. Skip dose if SBP <130 mmHg Patient not taking: No sig reported 11/22/20 12/22/20  Val Riles, MD  cetirizine (ZYRTEC) 5 MG tablet Take 1 tablet (5 mg total) by mouth daily. 09/12/20   Jaynee Eagles, PA-C  diclofenac Sodium (VOLTAREN) 1 % GEL Apply 2 g topically 4 (four) times daily. 11/14/20   Volney American, PA-C  donepezil (ARICEPT) 5 MG tablet TAKE 1 TABLET BY MOUTH EVERYDAY AT BEDTIME 10/20/20   Bedsole, Amy E, MD  escitalopram (LEXAPRO) 20 MG tablet TAKE 1 TABLET BY MOUTH EVERY DAY 10/23/20   Bedsole, Amy E, MD  midodrine (PROAMATINE) 5 MG tablet Take 1 tablet (5 mg total) by mouth daily as needed (if SBP <100 and stop Amlodipine). Patient not taking: Reported on 01/08/2021 11/21/20   Val Riles, MD  Multiple Vitamins-Minerals (MULTIVITAMIN WITH MINERALS) tablet Take 1 tablet by mouth daily.    [provider]  TART CHERRY PO Take 1 tablet by mouth in the morning and at bedtime.    [provider]  tiotropium (SPIRIVA HANDIHALER) 18 MCG inhalation capsule INHALE 1 CAPSULE VIA HANDIHALER ONCE DAILY AT THE SAME TIME EVERY DAY 09/16/20   Bedsole, Amy E, MD  Vitamin D, Ergocalciferol, (DRISDOL) 1.25 MG (50000 UNIT) CAPS capsule TAKE 1 CAPSULE (50,000 UNITS TOTAL) BY MOUTH EVERY FRIDAY. 10/16/20   Jinny Sanders, MD     Allergies Lovastatin, Statins, Sulfa antibiotics, Codeine, and Niacin  Family History  Problem Relation Age of Onset  . COPD Brother   . Heart disease Brother   . Diabetes Brother   . Lymphoma Brother   . Alzheimer's disease Brother   . Pancreatic cancer Sister   . Alzheimer's disease Sister   . Heart disease Mother   . Breast cancer Sister   . Leukemia Other   . Kidney disease Neg Hx     Social History Social History   Tobacco Use  . Smoking status: Former Smoker    Packs/day: 1.00    Years: 45.00    Pack years: 45.00    Types: Cigarettes    Quit date: 06/16/1999    Years since quitting: 21.5  . Smokeless tobacco: Never Used  . Tobacco comment: quit 15 years ago  Substance Use Topics  . Alcohol use: No    Alcohol/week: 0.0 standard drinks  .  Drug use: No     Review of Systems  Constitutional: No fever/chills Eyes:  No discharge ENT: No upper respiratory complaints. Respiratory: no cough. No SOB/ use of accessory muscles to breath Gastrointestinal:   No nausea, no vomiting.  No diarrhea.  No constipation. Musculoskeletal: Patient has left shoulder pain.  Skin: Negative for rash, abrasions, lacerations, ecchymosis.    ____________________________________________   PHYSICAL EXAM:  VITAL SIGNS: ED Triage Vitals  Enc Vitals Group     BP 01/14/21 1532 124/65     Pulse Rate 01/14/21 1532 97     Resp 01/14/21 1532 20     Temp 01/14/21 1532 98.1 F (36.7 C)     Temp Source 01/14/21 1532 Oral     SpO2 01/14/21 1532 92 %     Weight 01/14/21 1533 158 lb 11.7 oz (72 kg)     Height 01/14/21 1533 5\' 2"  (1.575 m)     Head Circumference --      Peak Flow --      Pain Score 01/14/21 1533 4     Pain Loc --      Pain Edu? --      Excl. in [place]? --      Constitutional: Alert and oriented. Well appearing and in no acute distress. Eyes: Conjunctivae are normal. PERRL. EOMI. Head: Atraumatic. ENT:      Nose: No congestion/rhinnorhea.      Mouth/Throat:  Mucous membranes are moist.  Neck: No stridor.  No cervical spine tenderness to palpation. Cardiovascular: Normal rate, regular rhythm. Normal S1 and S2.  Good peripheral circulation. Respiratory: Normal respiratory effort without tachypnea or retractions. Lungs CTAB. Good air entry to the bases with no decreased or absent breath sounds Gastrointestinal: Bowel sounds x 4 quadrants. Soft and nontender to palpation. No guarding or rigidity. No distention. Musculoskeletal: Patient has symmetric strength in the upper extremities.  She has limited range of motion of the left shoulder. Neurologic:  Normal for age. No gross focal neurologic deficits are appreciated.  Skin:  Skin is warm, dry and intact. No rash noted. Psychiatric: Mood and affect are normal for age. Speech and behavior are normal.   ____________________________________________   LABS (all labs ordered are listed, but only abnormal results are displayed)  Labs Reviewed  URINALYSIS, COMPLETE (UACMP) WITH MICROSCOPIC - Abnormal; Notable for the following components:      Result Value   Color, Urine YELLOW (*)    APPearance CLEAR (*)    Protein, ur 30 (*)    Leukocytes,Ua SMALL (*)    All other components within normal limits  CBC WITH DIFFERENTIAL/PLATELET - Abnormal; Notable for the following components:   WBC 12.7 (*)    RBC 3.79 (*)    Hemoglobin 11.3 (*)    HCT 35.9 (*)    Monocytes Absolute 2.2 (*)    Abs Immature Granulocytes 0.36 (*)    All other components within normal limits  COMPREHENSIVE METABOLIC PANEL - Abnormal; Notable for the following components:   Glucose, Bld 141 (*)    BUN 39 (*)    Creatinine, Ser 2.07 (*)    GFR, Estimated 23 (*)    All other components within normal limits   ____________________________________________  EKG   ____________________________________________  RADIOLOGY   No results found.  ____________________________________________    PROCEDURES  Procedure(s)  performed:     Procedures     Medications  sodium chloride 0.9 % bolus 500 mL (0 mLs Intravenous Stopped 01/14/21 2002)  HYDROcodone-acetaminophen (  NORCO/VICODIN) 5-325 MG per tablet 1 tablet (1 tablet Oral Given 01/14/21 2016)  ondansetron (ZOFRAN-ODT) disintegrating tablet 4 mg (4 mg Oral Given 01/14/21 2021)     ____________________________________________   INITIAL IMPRESSION / ASSESSMENT AND PLAN / ED COURSE  Pertinent labs & imaging results that were available during my care of the patient were reviewed by me and considered in my medical decision making (see chart for details).  Clinical Course as of 01/14/21 2043  Thu Jan 14, 2021  1703 Specific Gravity, Urine: 1.014 [JW]    Clinical Course User Index [JW] Lannie Fields, PA-C     Assessment and plan 87 year old female presents to the emergency department with concern for UTI and questions about pain management.  Vital signs are reassuring at triage.  On physical exam, patient was alert, active and nontoxic-appearing.   Patient had mild increase in her creatinine from her baseline.  Mild leukocytosis on CBC but was otherwise reassuring.  Urinalysis showed a small amount of leuks but no other findings concerning for UTI.  Patient was given 500 cc of normal saline in the emergency department.  I discontinued her tramadol and started on a short course of Norco for pain.  Return precautions were given to return with new or worsening symptoms.  All patient questions were answered.   ____________________________________________  FINAL CLINICAL IMPRESSION(S) / ED DIAGNOSES  Final diagnoses:  Acute pain of left shoulder  Dehydration      NEW MEDICATIONS STARTED DURING THIS VISIT:  ED Discharge Orders         Ordered    HYDROcodone-acetaminophen (NORCO) 5-325 MG tablet  Every 6 hours PRN        01/14/21 1954    ondansetron (ZOFRAN ODT) 4 MG disintegrating tablet  Every 8 hours PRN        01/14/21 1954               This chart was dictated using voice recognition software/Dragon. Despite best efforts to proofread, errors can occur which can change the meaning. Any change was purely unintentional.     Lannie Fields, PA-C 01/14/21 2134    Naaman Plummer, MD 01/14/21 2141

## 2021-01-14 NOTE — Telephone Encounter (Signed)
[name] said pt continues to have chest pain; pt has said that it hurts in her chest when she breathes. Now pt is confused and talking to herself; [name] is not sure if heart things going on or if possibly getting UTI; pts urine  Has a strong smell to it. [name] said EMS came out earlier this morning and EMS said was not her heart and to contact PCP. Kristen asked if I would call EMS and I did call 911 and ask if EMS would transport pt to ED this time and 911 dispatcher said she would take that note for EMS. Also [name] said that [place]ts [id number] but at end of pts driveway is a large sign with [phone] on it and that is where EMS would want to turn in at; pts son is in a pick up truck at end of driveway to help guide EMS in also. EMS was notified about which drive to go into and voiced understanding.

## 2021-01-15 DIAGNOSIS — M25512 Pain in left shoulder: Secondary | ICD-10-CM | POA: Diagnosis not present

## 2021-01-16 ENCOUNTER — Telehealth: Payer: Medicare PPO | Admitting: Orthopedic Surgery

## 2021-01-16 DIAGNOSIS — R399 Unspecified symptoms and signs involving the genitourinary system: Secondary | ICD-10-CM | POA: Diagnosis not present

## 2021-01-16 MED ORDER — CIPROFLOXACIN HCL 250 MG PO TABS: 250.0000 mg | ORAL_TABLET | Freq: Two times a day (BID) | ORAL | 0 refills | Status: AC

## 2021-01-16 NOTE — Progress Notes (Signed)
We are sorry that you are not feeling well.  Here is how we plan to help!  Based on what you shared with me it looks like you most likely have a simple urinary tract infection.  A UTI (Urinary Tract Infection) is a bacterial infection of the bladder.  Most cases of urinary tract infections are simple to treat but a key part of your care is to encourage you to drink plenty of fluids and watch your symptoms carefully.  I have prescribed Cipro 250mg  bid for 3 days.  Your symptoms should gradually improve. Call us if the burning in your urine worsens, you develop worsening fever, back pain or pelvic pain or if your symptoms do not resolve after completing the antibiotic.  Urinary tract infections can be prevented by drinking plenty of water to keep your body hydrated.  Also be sure when you wipe, wipe from front to back and don't hold it in!  If possible, empty your bladder every 4 hours.  Your e-visit answers were reviewed by a board certified advanced clinical practitioner to complete your personal care plan.  Depending on the condition, your plan could have included both over the counter or prescription medications.  If there is a problem please reply  once you have received a response from your provider.  Your safety is important to [place].  If you have drug allergies check your prescription carefully.    You can use MyChart to ask questions about today's visit, request a non-urgent call back, or ask for a work or school excuse for 24 hours related to this e-Visit. If it has been greater than 24 hours you will need to follow up with your provider, or enter a new e-Visit to address those concerns.   You will get an e-mail in the next two days asking about your experience.  I hope that your e-visit has been valuable and will speed your recovery. Thank you for using e-visits.   Greater than 5 minutes, yet less than 10 minutes of time have been spent researching, coordinating and implementing care  for this patient today.

## 2021-01-19 ENCOUNTER — Encounter (HOSPITAL_COMMUNITY): Payer: Self-pay | Admitting: Emergency Medicine

## 2021-01-19 ENCOUNTER — Other Ambulatory Visit: Payer: Self-pay

## 2021-01-19 ENCOUNTER — Emergency Department (HOSPITAL_COMMUNITY): Payer: Medicare PPO

## 2021-01-19 ENCOUNTER — Inpatient Hospital Stay (HOSPITAL_COMMUNITY): Payer: Medicare PPO

## 2021-01-19 ENCOUNTER — Inpatient Hospital Stay (HOSPITAL_COMMUNITY)
Admission: EM | Admit: 2021-01-19 | Discharge: 2021-01-21 | DRG: 086 | Disposition: A | Discharge status: Home Health | Payer: Medicare PPO | Attending: Internal Medicine | Admitting: Internal Medicine

## 2021-01-19 DIAGNOSIS — S0003XA Contusion of scalp, initial encounter: Secondary | ICD-10-CM | POA: Diagnosis not present

## 2021-01-19 DIAGNOSIS — Z87891 Personal history of nicotine dependence: Secondary | ICD-10-CM | POA: Diagnosis not present

## 2021-01-19 DIAGNOSIS — R4781 Slurred speech: Secondary | ICD-10-CM | POA: Diagnosis present

## 2021-01-19 DIAGNOSIS — R296 Repeated falls: Secondary | ICD-10-CM | POA: Diagnosis present

## 2021-01-19 DIAGNOSIS — Z882 Allergy status to sulfonamides status: Secondary | ICD-10-CM

## 2021-01-19 DIAGNOSIS — J309 Allergic rhinitis, unspecified: Secondary | ICD-10-CM | POA: Diagnosis present

## 2021-01-19 DIAGNOSIS — I129 Hypertensive chronic kidney disease with stage 1 through stage 4 chronic kidney disease, or unspecified chronic kidney disease: Secondary | ICD-10-CM | POA: Diagnosis present

## 2021-01-19 DIAGNOSIS — Z87440 Personal history of urinary (tract) infections: Secondary | ICD-10-CM

## 2021-01-19 DIAGNOSIS — Z888 Allergy status to other drugs, medicaments and biological substances status: Secondary | ICD-10-CM | POA: Diagnosis not present

## 2021-01-19 DIAGNOSIS — G9389 Other specified disorders of brain: Secondary | ICD-10-CM | POA: Diagnosis not present

## 2021-01-19 DIAGNOSIS — Z66 Do not resuscitate: Secondary | ICD-10-CM | POA: Diagnosis present

## 2021-01-19 DIAGNOSIS — E1169 Type 2 diabetes mellitus with other specified complication: Secondary | ICD-10-CM | POA: Diagnosis present

## 2021-01-19 DIAGNOSIS — E559 Vitamin D deficiency, unspecified: Secondary | ICD-10-CM | POA: Diagnosis present

## 2021-01-19 DIAGNOSIS — G9340 Encephalopathy, unspecified: Secondary | ICD-10-CM | POA: Diagnosis present

## 2021-01-19 DIAGNOSIS — Z833 Family history of diabetes mellitus: Secondary | ICD-10-CM

## 2021-01-19 DIAGNOSIS — E1140 Type 2 diabetes mellitus with diabetic neuropathy, unspecified: Secondary | ICD-10-CM | POA: Diagnosis present

## 2021-01-19 DIAGNOSIS — N390 Urinary tract infection, site not specified: Secondary | ICD-10-CM | POA: Diagnosis not present

## 2021-01-19 DIAGNOSIS — F0390 Unspecified dementia without behavioral disturbance: Secondary | ICD-10-CM | POA: Diagnosis present

## 2021-01-19 DIAGNOSIS — E1122 Type 2 diabetes mellitus with diabetic chronic kidney disease: Secondary | ICD-10-CM | POA: Diagnosis not present

## 2021-01-19 DIAGNOSIS — W19XXXA Unspecified fall, initial encounter: Secondary | ICD-10-CM | POA: Diagnosis present

## 2021-01-19 DIAGNOSIS — I639 Cerebral infarction, unspecified: Secondary | ICD-10-CM | POA: Diagnosis not present

## 2021-01-19 DIAGNOSIS — Z825 Family history of asthma and other chronic lower respiratory diseases: Secondary | ICD-10-CM

## 2021-01-19 DIAGNOSIS — S065X0A Traumatic subdural hemorrhage without loss of consciousness, initial encounter: Principal | ICD-10-CM | POA: Diagnosis present

## 2021-01-19 DIAGNOSIS — I6203 Nontraumatic chronic subdural hemorrhage: Secondary | ICD-10-CM | POA: Diagnosis not present

## 2021-01-19 DIAGNOSIS — Z79899 Other long term (current) drug therapy: Secondary | ICD-10-CM

## 2021-01-19 DIAGNOSIS — E1159 Type 2 diabetes mellitus with other circulatory complications: Secondary | ICD-10-CM | POA: Diagnosis present

## 2021-01-19 DIAGNOSIS — E785 Hyperlipidemia, unspecified: Secondary | ICD-10-CM | POA: Diagnosis present

## 2021-01-19 DIAGNOSIS — S065X9A Traumatic subdural hemorrhage with loss of consciousness of unspecified duration, initial encounter: Secondary | ICD-10-CM | POA: Diagnosis not present

## 2021-01-19 DIAGNOSIS — Z9181 History of falling: Secondary | ICD-10-CM

## 2021-01-19 DIAGNOSIS — N184 Chronic kidney disease, stage 4 (severe): Secondary | ICD-10-CM | POA: Diagnosis present

## 2021-01-19 DIAGNOSIS — Z20822 Contact with and (suspected) exposure to covid-19: Secondary | ICD-10-CM | POA: Diagnosis present

## 2021-01-19 DIAGNOSIS — F32A Depression, unspecified: Secondary | ICD-10-CM | POA: Diagnosis present

## 2021-01-19 DIAGNOSIS — R29810 Facial weakness: Secondary | ICD-10-CM | POA: Diagnosis present

## 2021-01-19 DIAGNOSIS — Z885 Allergy status to narcotic agent status: Secondary | ICD-10-CM

## 2021-01-19 DIAGNOSIS — Y929 Unspecified place or not applicable: Secondary | ICD-10-CM

## 2021-01-19 DIAGNOSIS — S065XAA Traumatic subdural hemorrhage with loss of consciousness status unknown, initial encounter: Secondary | ICD-10-CM

## 2021-01-19 DIAGNOSIS — Z043 Encounter for examination and observation following other accident: Secondary | ICD-10-CM | POA: Diagnosis not present

## 2021-01-19 DIAGNOSIS — Z96643 Presence of artificial hip joint, bilateral: Secondary | ICD-10-CM | POA: Diagnosis present

## 2021-01-19 DIAGNOSIS — I10 Essential (primary) hypertension: Secondary | ICD-10-CM | POA: Diagnosis not present

## 2021-01-19 DIAGNOSIS — R410 Disorientation, unspecified: Secondary | ICD-10-CM | POA: Diagnosis present

## 2021-01-19 DIAGNOSIS — M4804 Spinal stenosis, thoracic region: Secondary | ICD-10-CM | POA: Diagnosis not present

## 2021-01-19 DIAGNOSIS — I6200 Nontraumatic subdural hemorrhage, unspecified: Secondary | ICD-10-CM | POA: Diagnosis not present

## 2021-01-19 DIAGNOSIS — Z8249 Family history of ischemic heart disease and other diseases of the circulatory system: Secondary | ICD-10-CM | POA: Diagnosis not present

## 2021-01-19 DIAGNOSIS — I152 Hypertension secondary to endocrine disorders: Secondary | ICD-10-CM | POA: Diagnosis present

## 2021-01-19 DIAGNOSIS — M4312 Spondylolisthesis, cervical region: Secondary | ICD-10-CM | POA: Diagnosis not present

## 2021-01-19 DIAGNOSIS — Z820 Family history of epilepsy and other diseases of the nervous system: Secondary | ICD-10-CM

## 2021-01-19 DIAGNOSIS — R4701 Aphasia: Secondary | ICD-10-CM | POA: Diagnosis not present

## 2021-01-19 DIAGNOSIS — G9608 Other cranial cerebrospinal fluid leak: Secondary | ICD-10-CM | POA: Diagnosis present

## 2021-01-19 DIAGNOSIS — I6782 Cerebral ischemia: Secondary | ICD-10-CM | POA: Diagnosis not present

## 2021-01-19 LAB — COMPREHENSIVE METABOLIC PANEL
ALT: 22 U/L (ref 0–44)
AST: 27 U/L (ref 15–41)
Albumin: 3 g/dL — ABNORMAL LOW (ref 3.5–5.0)
Alkaline Phosphatase: 93 U/L (ref 38–126)
Anion gap: 10 (ref 5–15)
BUN: 37 mg/dL — ABNORMAL HIGH (ref 8–23)
CO2: 24 mmol/L (ref 22–32)
Calcium: 8.6 mg/dL — ABNORMAL LOW (ref 8.9–10.3)
Chloride: 102 mmol/L (ref 98–111)
Creatinine, Ser: 1.81 mg/dL — ABNORMAL HIGH (ref 0.44–1.00)
GFR, Estimated: 27 mL/min — ABNORMAL LOW (ref >60)
Glucose, Bld: 196 mg/dL — ABNORMAL HIGH (ref 70–99)
Potassium: 4.2 mmol/L (ref 3.5–5.1)
Sodium: 136 mmol/L (ref 135–145)
Total Bilirubin: 0.6 mg/dL (ref 0.3–1.2)
Total Protein: 6.5 g/dL (ref 6.5–8.1)

## 2021-01-19 LAB — CBC
HCT: 37 % (ref 36.0–46.0)
Hemoglobin: 11.8 g/dL — ABNORMAL LOW (ref 12.0–15.0)
MCH: 30.3 pg (ref 26.0–34.0)
MCHC: 31.9 g/dL (ref 30.0–36.0)
MCV: 95.1 fL (ref 80.0–100.0)
Platelets: 355 10*3/uL (ref 150–400)
RBC: 3.89 MIL/uL (ref 3.87–5.11)
RDW: 14.1 % (ref 11.5–15.5)
WBC: 12.5 10*3/uL — ABNORMAL HIGH (ref 4.0–10.5)
nRBC: 0 % (ref 0.0–0.2)

## 2021-01-19 LAB — DIFFERENTIAL
Abs Immature Granulocytes: 0.19 10*3/uL — ABNORMAL HIGH (ref 0.00–0.07)
Basophils Absolute: 0 10*3/uL (ref 0.0–0.1)
Basophils Relative: 0 %
Eosinophils Absolute: 0.1 10*3/uL (ref 0.0–0.5)
Eosinophils Relative: 1 %
Immature Granulocytes: 2 %
Lymphocytes Relative: 31 %
Lymphs Abs: 3.9 10*3/uL (ref 0.7–4.0)
Monocytes Absolute: 1.9 10*3/uL — ABNORMAL HIGH (ref 0.1–1.0)
Monocytes Relative: 15 %
Neutro Abs: 6.4 10*3/uL (ref 1.7–7.7)
Neutrophils Relative %: 51 %

## 2021-01-19 LAB — RESP PANEL BY RT-PCR (FLU A&B, COVID) ARPGX2
Influenza A by PCR: NEGATIVE
Influenza B by PCR: NEGATIVE
SARS Coronavirus 2 by RT PCR: NEGATIVE

## 2021-01-19 LAB — I-STAT CHEM 8, ED
BUN: 38 mg/dL — ABNORMAL HIGH (ref 8–23)
Calcium, Ion: 1.18 mmol/L (ref 1.15–1.40)
Chloride: 104 mmol/L (ref 98–111)
Creatinine, Ser: 1.9 mg/dL — ABNORMAL HIGH (ref 0.44–1.00)
Glucose, Bld: 190 mg/dL — ABNORMAL HIGH (ref 70–99)
HCT: 36 % (ref 36.0–46.0)
Hemoglobin: 12.2 g/dL (ref 12.0–15.0)
Potassium: 4.3 mmol/L (ref 3.5–5.1)
Sodium: 139 mmol/L (ref 135–145)
TCO2: 24 mmol/L (ref 22–32)

## 2021-01-19 LAB — APTT: aPTT: 24 seconds (ref 24–36)

## 2021-01-19 LAB — PROTIME-INR
INR: 1.1 (ref 0.8–1.2)
Prothrombin Time: 14.2 seconds (ref 11.4–15.2)

## 2021-01-19 LAB — ETHANOL: Alcohol, Ethyl (B): <10 mg/dL (ref <10)

## 2021-01-19 MED ORDER — UMECLIDINIUM BROMIDE 62.5 MCG/INH IN AEPB
1.0000 | INHALATION_SPRAY | Freq: Every day | RESPIRATORY_TRACT | Status: DC
  Administered 2021-01-20 – 2021-01-21 (×2): 1 via RESPIRATORY_TRACT
  Filled 2021-01-19 (×2): qty 7

## 2021-01-19 MED ORDER — ESCITALOPRAM OXALATE 10 MG PO TABS
20.0000 mg | ORAL_TABLET | Freq: Every day | ORAL | Status: DC
  Administered 2021-01-20 – 2021-01-21 (×2): 20 mg via ORAL
  Filled 2021-01-19 (×2): qty 2

## 2021-01-19 MED ORDER — SODIUM CHLORIDE 0.9 % IV SOLN
250.0000 mg | Freq: Two times a day (BID) | INTRAVENOUS | Status: DC
  Filled 2021-01-19 (×2): qty 2.5

## 2021-01-19 MED ORDER — LEVETIRACETAM 250 MG PO TABS
250.0000 mg | ORAL_TABLET | Freq: Two times a day (BID) | ORAL | Status: DC
  Filled 2021-01-19 (×2): qty 1

## 2021-01-19 MED ORDER — AMLODIPINE BESYLATE 10 MG PO TABS
10.0000 mg | ORAL_TABLET | Freq: Every day | ORAL | Status: DC
  Administered 2021-01-20 – 2021-01-21 (×2): 10 mg via ORAL
  Filled 2021-01-19: qty 1
  Filled 2021-01-19: qty 2
  Filled 2021-01-19: qty 1

## 2021-01-19 MED ORDER — DONEPEZIL HCL 5 MG PO TABS
5.0000 mg | ORAL_TABLET | Freq: Every day | ORAL | Status: DC
  Administered 2021-01-19 – 2021-01-20 (×2): 5 mg via ORAL
  Filled 2021-01-19 (×2): qty 1

## 2021-01-19 MED ORDER — ACETAMINOPHEN 650 MG RE SUPP: 650.0000 mg | Freq: Four times a day (QID) | RECTAL | Status: DC | PRN

## 2021-01-19 MED ORDER — SODIUM CHLORIDE 0.9 % IV SOLN: INTRAVENOUS | Status: DC

## 2021-01-19 MED ORDER — ACETAMINOPHEN 325 MG PO TABS: 650.0000 mg | ORAL_TABLET | Freq: Four times a day (QID) | ORAL | Status: DC | PRN

## 2021-01-19 NOTE — ED Triage Notes (Signed)
Pt reporting weakness, confusion, slurred speech, aphasia and facial droop that has been going on and off this past weekend. Pt's granddaughter at bedside reports rt facial droop and slurred speech started again about 2:45pm in the lobby. Pt A&Ox4 at this time. Pt fell about two weeks ago, hit her head. Not on blood thinners.   Pt currently taking Cipro for UTI - granddaughter concerned about possible reoccurrence of CDiff d/t abnormal stool.

## 2021-01-19 NOTE — ED Notes (Signed)
Spoke with Maudie Mercury at [place] to page out a code stroke per [facility]

## 2021-01-19 NOTE — ED Notes (Signed)
Delay in transport to CT 2 d/t phlebotomy collecting labs. Transported to CT at this time by this triage RN.

## 2021-01-19 NOTE — Consult Note (Addendum)
Neurology Consultation  Reason for Consult: Transient slurred speech, left mouth droop, increasing confusion, increasing weakness, and right nasolabial fold flattening for 4 days  Referring Physician: Dr. Alvino Chapel  CC: Increasing confusion, transient slurred speech, increasing weakness of bilateral lower extremities  History is obtained from: Patient granddaughter (primary caregiver), Chart review  HPI: Jill [name] is a 87 y.o. female with a medical history significant for diabetes mellitus, hypertension, hyperlipidemia, and chronic kidney disease who presented to the ED for evaluation of approximately 4 days of transient slurred speech, increased confusion, and intermittent facial asymmetry. Per patient's granddaughter (her primary caregiver) Jill [name] was in her normal state of health until approximately 2 weeks ago when she fell and hit her right head. She states that approximately one week ago on Friday, May 20 she fell a second time but did not hit her head. Patient was seen on 5/20 by her PCP for xray imaging of her hip and left ribs due to pain after her fall and was evaluated without confusion, neuro changes, or headaches- however Jill [name] and her granddaughter state that she has complained of right head pain and soreness since she fell approximately 2 weeks ago. Her granddaughter states that starting Friday, May 27, Jill [name] has been intermittently increasingly confused with transient slurred speech with facial asymmetry. She notes that Jill [name]' symptoms have waxed and waned since Friday. She states that at one point on Saturday night, she noted a left mouth droop that resolved. Her granddaughter states that Jill [name] has been unable to read and seems to forget simple tasks such as holding onto a newspaper and has been increasingly weak in her lower extremities requiring much more assistance for ambulation and positioning than she normally would. Today, after an episode of confusion  and slurred speech, she was advised to come to the ED for evaluation. While in the waiting room, [name] had a sudden onset of right nasolabial fold flattening with her confusion and a code stroke was activated for further evaluation.   At baseline Jill [name] lives with her granddaughter. Her granddaughter is her primary caregiver. Jill [name] requires help with medication management and is usually able to ambulate with a walker or a cane. She does have some confusion at baseline with time (month, year) but is usually able to read and speaks without dysarthria or aphasia. She is able to recall self and others in her family at baseline without difficulty. Also, of note, Jill [name] was started on Cipro for a UTI on Sunday and has received 2 doses of her antibiotic.   LKW: Friday, May 27 tpa given?: no, recent fall with head trauma, out of the thrombolytic therapy window IR Thrombectomy? No, presentation not consistent with LVO Modified Rankin Scale: 4-Needs assistance to walk and tend to bodily needs  ROS: A complete ROS was performed and is negative except as noted in the HPI.   Past Medical History:  Diagnosis Date  . Allergy   . Arthritis    osteoarthritis   . Chronic kidney disease   . Complication of anesthesia    difficult waking   . Depression   . Diabetes mellitus   . Diverticula, colon   . Family history of anesthesia complication    Son is difficult intubation  . Hyperlipidemia   . Hypertension   . Vasovagal syncope 2006   Negative cardiac workup-myoview, echo   Past Surgical History:  Procedure Laterality Date  . ABDOMINAL HYSTERECTOMY  1977   fibroid  .  JOINT REPLACEMENT  2009   rt hip  . TOTAL HIP ARTHROPLASTY Left 05/12/2014   dr Mayer Camel  . TOTAL HIP ARTHROPLASTY Left 05/12/2014   Procedure: TOTAL HIP ARTHROPLASTY;  Surgeon: Kerin Salen, MD;  Location: [facility];  Service: Orthopedics;  Laterality: Left;   Family History  Problem Relation Age of Onset  . COPD  Brother   . Heart disease Brother   . Diabetes Brother   . Lymphoma Brother   . Alzheimer's disease Brother   . Pancreatic cancer Sister   . Alzheimer's disease Sister   . Heart disease Mother   . Breast cancer Sister   . Leukemia Other   . Kidney disease Neg Hx    Social History:   reports that she quit smoking about 21 years ago. Her smoking use included cigarettes. She has a 45.00 pack-year smoking history. She has never used smokeless tobacco. She reports that she does not drink alcohol and does not use drugs.  Medications No current facility-administered medications for this encounter.  Current Outpatient Medications:  .  amLODipine (NORVASC) 10 MG tablet, Take 1 tablet (10 mg total) by mouth daily. Skip dose if SBP <130 mmHg (Patient not taking: No sig reported), Disp: 30 tablet, Rfl: 0 .  cetirizine (ZYRTEC) 5 MG tablet, Take 1 tablet (5 mg total) by mouth daily., Disp: 90 tablet, Rfl: 0 .  ciprofloxacin (CIPRO) 250 MG tablet, Take 1 tablet (250 mg total) by mouth 2 (two) times daily for 3 days., Disp: 6 tablet, Rfl: 0 .  diclofenac Sodium (VOLTAREN) 1 % GEL, Apply 2 g topically 4 (four) times daily., Disp: 100 g, Rfl: 1 .  donepezil (ARICEPT) 5 MG tablet, TAKE 1 TABLET BY MOUTH EVERYDAY AT BEDTIME, Disp: 90 tablet, Rfl: 0 .  escitalopram (LEXAPRO) 20 MG tablet, TAKE 1 TABLET BY MOUTH EVERY DAY, Disp: 90 tablet, Rfl: 1 .  midodrine (PROAMATINE) 5 MG tablet, Take 1 tablet (5 mg total) by mouth daily as needed (if SBP <100 and stop Amlodipine). (Patient not taking: Reported on 01/08/2021), Disp: , Rfl:  .  Multiple Vitamins-Minerals (MULTIVITAMIN WITH MINERALS) tablet, Take 1 tablet by mouth daily., Disp: , Rfl:  .  ondansetron (ZOFRAN ODT) 4 MG disintegrating tablet, Take 1 tablet (4 mg total) by mouth every 8 (eight) hours as needed for up to 5 days., Disp: 15 tablet, Rfl: 0 .  TART CHERRY PO, Take 1 tablet by mouth in the morning and at bedtime., Disp: , Rfl:  .  tiotropium  (SPIRIVA HANDIHALER) 18 MCG inhalation capsule, INHALE 1 CAPSULE VIA HANDIHALER ONCE DAILY AT THE SAME TIME EVERY DAY, Disp: 30 capsule, Rfl: 5 .  Vitamin D, Ergocalciferol, (DRISDOL) 1.25 MG (50000 UNIT) CAPS capsule, TAKE 1 CAPSULE (50,000 UNITS TOTAL) BY MOUTH EVERY FRIDAY., Disp: 12 capsule, Rfl: 3  Exam: Current vital signs: BP (!) 175/63   Pulse 79   Temp 97.7 F (36.5 C) (Oral)   Resp 12   Ht 5\' 3"  (1.6 m)   Wt 70.3 kg   SpO2 96%   BMI 27.46 kg/m  Vital signs in last 24 hours: Temp:  [97.7 F (36.5 C)-98 F (36.7 C)] 97.7 F (36.5 C) (05/31 1612) Pulse Rate:  [78-92] 79 (05/31 1616) Resp:  [12-18] 12 (05/31 1616) BP: (123-175)/(63-84) 175/63 (05/31 1616) SpO2:  [96 %] 96 % (05/31 1616) Weight:  [70.3 kg] 70.3 kg (05/31 1532)  GENERAL: Awake, alert in no acute distress Psych: Pleasantly confused, affect appropriate for situation, calm  and cooperative with examination Head: Normocephalic without obvious abnormality EENT: Wears eyeglasses at baseline, normal conjunctivae, no OP obstruction LUNGS: Normal respiratory effort. Non-labored breathing CV: Regular rate on telemetry, extremities warm without edema ABDOMEN: Soft, non-tender Ext: warm, without obvious abnormality  NEURO:  Mental Status: Awake, alert, and oriented to self and age. She initially incorrectly states that the year is [place] but quickly corrects to 2022. She states that the month is June.  She is able to provide small details of her history of present illness but is unable to provide specific details. Her speech is intermittently mildly dysarthric but she is not aphasic. Naming and repetition remain intact.  No neglect is noted.  Cranial Nerves:  II: PERRL 3 mm --> 2 mm / brisk. Visual fields full.  III, IV, VI: EOMI with saccadic movements, no ptosis noted. V: Sensation is intact to light touch and symmetrical to face. VII: Face is asymmetric with right nasolabial fold flattening VIII: Hearing is  intact to voice IX, X: Palate elevation is symmetric. Phonation normal.  XI: Normal sternocleidomastoid and trapezius muscle strength XII: Tongue protrudes midline without fasciculations.   Motor: Bilateral upper extremities 5/5 strength without pronator drift on assessment. Bilateral lower extremities are equally weak with 2/5 strength noted with minimal effort against gravity (new and progressive per granddaughter since Friday, May 27). Tone and bulk are normal.  Sensation: Decreased sensation to light touch reported on left upper and lower extremity on NP examination, on MD evaluation she reported equal sensation throughout Coordination: FNF with mild tremor at end reach bilaterally, HKS unable to be assessed secondary to bilateral lower extremity weakness DTRs: 2+ and symmetric biceps, brachioradialis, and patellae Gait: Stands briefly with two-person assist, when moved to scanner from stretcher  NIHSS: 1a Level of Conscious.: 0 1b LOC Questions: 1 1c LOC Commands: 0 2 Best Gaze: 0 3 Visual: 0 4 Facial Palsy: 1 5a Motor Arm - left: 0 5b Motor Arm - Right: 0 6a Motor Leg - Left: 3 6b Motor Leg - Right: 3 7 Limb Ataxia: 0 8 Sensory: 1 9 Best Language: 0 10 Dysarthria: 1 11 Extinct. and Inatten.: 0 TOTAL:  10  Labs I have reviewed labs in epic and the results pertinent to this consultation are: CBC    Component Value Date/Time   WBC 12.5 (H) 01/19/2021 1531   RBC 3.89 01/19/2021 1531   HGB 12.2 01/19/2021 1602   HGB 11.4 08/02/2018 1550   HCT 36.0 01/19/2021 1602   HCT 33.9 (L) 08/02/2018 1550   PLT 355 01/19/2021 1531   PLT 276 08/02/2018 1550   MCV 95.1 01/19/2021 1531   MCV 91 08/02/2018 1550   MCH 30.3 01/19/2021 1531   MCHC 31.9 01/19/2021 1531   RDW 14.1 01/19/2021 1531   RDW 13.3 08/02/2018 1550   LYMPHSABS 3.9 01/19/2021 1531   LYMPHSABS 3.3 (H) 08/02/2018 1550   MONOABS 1.9 (H) 01/19/2021 1531   EOSABS 0.1 01/19/2021 1531   EOSABS 0.2 08/02/2018 1550    BASOSABS 0.0 01/19/2021 1531   BASOSABS 0.1 08/02/2018 1550   CMP     Component Value Date/Time   NA 139 01/19/2021 1602   NA 138 02/25/2015 1336   K 4.3 01/19/2021 1602   CL 104 01/19/2021 1602   CO2 25 01/14/2021 1638   GLUCOSE 190 (H) 01/19/2021 1602   BUN 38 (H) 01/19/2021 1602   BUN 25 05/13/2015 1354   CREATININE 1.90 (H) 01/19/2021 1602   CREATININE 1.78 (H) 11/27/2020  1457   CALCIUM 9.0 01/14/2021 1638   CALCIUM 9.1 02/12/2008 2351   PROT 7.0 01/14/2021 1638   ALBUMIN 3.6 01/14/2021 1638   AST 17 01/14/2021 1638   ALT 10 01/14/2021 1638   ALKPHOS 76 01/14/2021 1638   BILITOT 1.2 01/14/2021 1638   GFRNONAA 23 (L) 01/14/2021 1638   GFRAA 22 (L) 05/17/2019 1317   Lipid Panel     Component Value Date/Time   CHOL 204 (H) 08/08/2018 1133   TRIG 128.0 08/08/2018 1133   HDL 61.50 08/08/2018 1133   CHOLHDL 3 08/08/2018 1133   VLDL 25.6 08/08/2018 1133   LDLCALC 117 (H) 08/08/2018 1133   LDLDIRECT 122.0 07/03/2017 1218   Lab Results  Component Value Date   HGBA1C 7.2 (H) 11/14/2020   Imaging I have reviewed the images obtained:  CT-scan of the brain: 1. Approximately 7 mm thick right cerebral convexity subdural hemorrhage. While largely intermediate density, there is an area of linear hyperdensity anteriorly that is consistent with acute/recent hemorrhage. Mild mass effect on the right frontal lobe due to the patient's brain atrophy with approximately 2 mm of leftward midline shift. 2. No evidence of acute large vascular territory infarct. 3. Small high right posterior scalp contusion without acute fracture. 4. Advanced chronic microvascular ischemic disease and atrophy, as detailed above. 5. Left sphenoid sinus air-fluid level.  Assessment: 87 year old female with PMHx as above who presents with transient increased confusion, facial asymmetry, and slurred speech. Of note was a recent fall with reported striking of her head on hard floor 01/02/2021 with a second fall  without striking her head on 01/08/2021.  - Examination revealed patient with some confusion, bilateral lower extremity weakness, right NLF flattening, and inconsistent decreased sensation to light touch on the left upper and lower extremities. Granddaughter at bedside describes fluctuating degrees of confusion and progressive weakness for approximately 4 days. - CT imaging with evidence of a right cerebral convexity subdural hemorrhage with an area of linear hyperdensity that is consistent with acute hemorrhage with mild mass effect on the right frontal lobe and approx. 2 mm of leftward midline shift.  - Acute encephalopathy may be multifactorial with recent fall with head injury, subdural hemorrhage, and recent reported UTI s/p 2 days of cipro.  - Given variable subjective and effort on serial examinations by NP, neurology attending and neurosurgery PA, suspect her lower extremity weakness is more related to confusion/delirium than an organic etiology.  Given facial droop has been variable and intermittent, this does not fit expected history for stroke and MRI brain is not needed at this time  Impression: Right cerebral convexity subdural hemorrhage with mild mass effect and approximately 2 mm of leftward midline shift Reported recent UTI Acute encephalopathy, likely multifactorial Risk for seizures due to cortical involvement of SDH  Recommendations: - Appreciate neurosurgery evaluation for recommendations on evaluation and management of SDH - Routine EEG - Evaluation and management of possible UTI per ED / primary team - Neurology will continue to follow  Anibal Henderson, AGAC-NP [facility] Pager: [phone]  Attending Neurologist's note:  I personally saw this patient, gathering history, performing a full neurologic examination, reviewing relevant labs, personally reviewing relevant imaging including head CT, and formulated the assessment and plan, adding the note above  for completeness and clarity to accurately reflect my thoughts  Lesleigh Noe MD-PhD [facility] [phone] Available 7 AM to 7 PM, outside these hours please contact Neurologist on call listed on AMION

## 2021-01-19 NOTE — Code Documentation (Signed)
Pt is a 87 yr old female who came to hospital for evaluation of slurred speech and facial droop. Pt was admitted at 1521, and code stroke activated at 1548. Pt was taken to CT scanner at 1552. Pt was alert and cooperative. Mild rt droop noted and legs equally unable to resist gravity. (NIHSS 8). Per pts granddaughter, these symptoms have been happening off and on since Friday.She has also had a recent fall.  CTNC showed rt SDH. Pt returned to room 35. She will need q 2 hr VS and mNIHSS while her workup continues.  Bedside handoff with Katharine Look RN complete. Not candidate for IV thrombolytic as hemorrhage present on CTNC. Not eligible for NIR as exam LVO negative.

## 2021-01-19 NOTE — Telephone Encounter (Signed)
Noted. Agree given pt's tenuous state and complicated history... eval in ER with CT labs etc is best for her.

## 2021-01-19 NOTE — Telephone Encounter (Signed)
I was unable to speak with Jill [name] and Jill [name]. I called pts home # and per DPR left v/m requesting cb. I did speak with Jill [name]; pt seen at ED on 01/14/21. Pt having more confusion and when pt lays down and puts pressure where fell and that causes more confusion. Pt  Sending note to Dr Diona Browner. Pt cannot walk but very short distance which is new since the fall. Pt had slurred speech over the weekend. Pt tried to read something and pt was saying something but did not make sense. Jill [name] said prefers not to go to ED. I spoke with DR Diona Browner and she will order CT but will need to be done today. Jill [name] again pt condition has changed and worsened since seen at ED on 01/14/21 with confusion and slurred speech. Jill [name] voiced understanding and will take pt to [facility] ED now. Sending note to Dr Diona Browner.

## 2021-01-19 NOTE — ED Provider Notes (Signed)
Emergency Medicine Provider Triage Evaluation Note  Jill [name] , a 87 y.o. female  was evaluated in triage.  Pt complains of intermittent confusion, weakness, and right facial droop over the weekend. While in the waiting room symptoms began again at 2:45 PM.  Review of Systems  Positive: + right facial droop, confusion, weakness Negative: - numbness, speech changes  Physical Exam  BP 123/84   Pulse 92   Temp 98 F (36.7 C)   Resp 17   SpO2 96%  Gen:   Awake, no distress   Resp:  Normal effort  MSK:   Moves extremities without difficulty  Other:  Right facial droop and drift noted.   Medical Decision Making  Medically screening exam initiated at 3:31 PM.  Appropriate orders placed.  Jill [name] was informed that the remainder of the evaluation will be completed by another provider, this initial triage assessment does not replace that evaluation, and the importance of remaining in the ED until their evaluation is complete.  Code stroke called in triage at 3:32 PM   Eustaquio Maize, PA-C 01/19/21 1532    Isla Pence, MD 01/20/21 [phone]

## 2021-01-19 NOTE — ED Notes (Signed)
Patient transported to MRI 

## 2021-01-19 NOTE — Consult Note (Signed)
   Providing Compassionate, Quality Care - Together  Neurosurgery Consult  Referring physician: Dr. Alvino Chapel Reason for referral: SDH  Chief Complaint: Fall/MS change  History of Present Illness: This is an 87 year old female with a history of chronic kidney disease, recent UTI, multiple falls over the past few months per the granddaughter at bedside, that has had intermittent facial droop on the left and mental status changes per the niece.  The mental status changes have not been constant, she is unsure if they have gotten better since her UTI treatment a few days ago.  She has noticed over the weekend left-sided facial droop that progressively has worsened.  She was brought to the hospital for this, CT of the brain without contrast for stroke work-up revealed a small right-sided convexity subdural hematoma, bilateral hygromas without significant mass-effect.  She is not currently on any anticoagulation.     Medications: I have reviewed the patient's current medications. Allergies: No Known Allergies  History reviewed. No pertinent family history. Social History:  has no history on file for tobacco use, alcohol use, and drug use.  ROS: All pertinent positives and negatives are listed in HPI above  Physical Exam:  Vital signs in last 24 hours: Temp:  [98 F (36.7 C)-98.3 F (36.8 C)] 98 F (36.7 C) (07/25 1814) Pulse Rate:  [58-128] 65 (07/26 0746) Resp:  [11-18] 14 (07/26 0217) BP: (138-182)/(65-125) 153/88 (07/26 0700) SpO2:  [91 %-98 %] 96 % (07/26 0746) PE: Awake, alert, oriented x3 No acute distress PERRLA EOMI Slight left-sided nasolabial facial droop Otherwise cranial nerves intact throughout Sensory intact throughout Full strength bilateral upper extremities Bilateral lower extremities 4/5, slight weakness more on the left due to chronic hip injury No drift   Impression/Assessment:  87 year old female with  1.  Small right acute on chronic subdural  hematoma/hygroma 2.  Mental status changes, unknown etiology  Plan:  -No acute neurosurgical intervention -No Keppra needed at this time, risks versus benefits are not beneficial in her age group nor with the small acute component -Repeat CT brain in the morning -CT brain reviewed, compared to January 2019, there were bilateral frontal hygromas at that time, there is now minimal small acute on chronic subdural hematoma on the right without significant mass-effect. -Work-up per medicine/neurology for facial droop as I do not believe that the subdural hematoma is the cause of this -PT/OT eval -Per the granddaughter, she does not regularly use her cane or walker as she is supposed to and therefore has intermittent falls at home   Thank you for allowing me to participate in this patient's care.  Please do not hesitate to call with questions or concerns.   Elwin Sleight, [facility] Cell: [phone]

## 2021-01-19 NOTE — ED Notes (Signed)
Neuro surgery at bedside.

## 2021-01-19 NOTE — ED Notes (Signed)
Symptoms have been going on all weekend.

## 2021-01-19 NOTE — ED Provider Notes (Addendum)
[facility] EMERGENCY DEPARTMENT Provider Note   CSN: [id number] Arrival date & time: 01/19/21  1320     History Chief Complaint  Patient presents with  . Code Stroke  . Altered Mental Status    Jill [name] is a 87 y.o. female. Level 5 caveat due to confusion. HPI Patient came in for facial droop mental status change.  Comes and goes.  Has been screened and called a code stroke by the time I saw her.  Reportedly was having symptoms since Thursday.  Has been seen at [facility] and treated for UTI.  Had had more mental status changes and also had a facial droop.  Right-sided facial droop.  Has been more unsteady.  More difficulty walking.  Reportedly had more confusion and more difficulty speaking while in the waiting room at around 245.  Reportedly there was a delay to being seen from the waiting room.  Reportedly did have a fall a couple weeks ago.  Not on anticoagulation.  Is on day 3 of Cipro.  At baseline does have a mild level of confusion.  Patient's granddaughter says she would always be able to identify her although she identifies her as a niece at this time.    Past Medical History:  Diagnosis Date  . Allergy   . Arthritis    osteoarthritis   . Chronic kidney disease   . Complication of anesthesia    difficult waking   . Depression   . Diabetes mellitus   . Diverticula, colon   . Family history of anesthesia complication    Son is difficult intubation  . Hyperlipidemia   . Hypertension   . Vasovagal syncope 2006   Negative cardiac workup-myoview, echo    Patient Active Problem List   Diagnosis Date Noted  . C. difficile diarrhea 11/14/2020  . Acute pain of left knee 09/29/2020  . Mild dementia ([place]) 07/03/2020  . Acute pain of right shoulder 07/03/2020  . Left foot pain 04/09/2020  . Acute infectious diarrhea 05/21/2019  . Bilateral leg weakness 05/21/2019  . Pulmonary nodule 05/29/2018  . Urinary urgency 02/03/2017  . Bilateral buttock  pain 02/02/2017  . Gout involving toe of right foot 12/23/2016  . [place] intertrigo 10/28/2016  . Stage 2 moderate COPD by GOLD classification ([place]) 09/13/2016  . Type 2 diabetes mellitus with stage 3 chronic kidney disease, without long-term current use of insulin ([place]) 08/08/2016  . Chronic diastolic congestive heart failure ([place]) 08/08/2016  . Intracranial bleed ([place])   . BPPV (benign paroxysmal positional vertigo) 06/24/2016  . Memory loss 06/24/2016  . Accidental fall 06/24/2016  . Chronic neck pain 03/18/2016  . Neuropathy due to type 2 diabetes mellitus ([place]) 03/18/2016  . Anemia in chronic kidney disease 12/15/2015  . Incomplete bladder emptying 10/15/2015  . Abscess, renal/perirenal 10/15/2015  . Recurrent UTI 10/15/2015  . Lumbar back pain with radiculopathy affecting left lower extremity 05/05/2015  . Lesion of left native kidney 01/28/2015  . Chronic fatigue 01/22/2015  . Chronic idiopathic urticaria 01/17/2015  . S/P hip replacement 05/16/2014  . DNR (do not resuscitate) 02/20/2014  . ALLERGIC RHINITIS 12/17/2008  . Vitamin D deficiency 07/01/2008  . CKD (chronic kidney disease) stage 4, GFR 15-29 ml/min (HCC) 02/11/2008  . DIVERTICULOSIS OF COLON 02/01/2008  . COLONIC POLYPS, ADENOMATOUS 10/19/2006  . Type 2 diabetes mellitus with diabetic neuropathy, unspecified ([place]) 10/19/2006  . Hyperlipidemia 10/19/2006  . OBESITY, NOS 10/19/2006  . Major depression, recurrent ([place]) 10/19/2006  .  Hypertension associated with diabetes ([place]) 10/19/2006  . Recurrent urticaria 10/19/2006    Past Surgical History:  Procedure Laterality Date  . ABDOMINAL HYSTERECTOMY  1977   fibroid  . JOINT REPLACEMENT  2009   rt hip  . TOTAL HIP ARTHROPLASTY Left 05/12/2014   dr Mayer Camel  . TOTAL HIP ARTHROPLASTY Left 05/12/2014   Procedure: TOTAL HIP ARTHROPLASTY;  Surgeon: Kerin Salen, MD;  Location: [facility];  Service: Orthopedics;  Laterality: Left;     OB History   No obstetric history  on file.     Family History  Problem Relation Age of Onset  . COPD Brother   . Heart disease Brother   . Diabetes Brother   . Lymphoma Brother   . Alzheimer's disease Brother   . Pancreatic cancer Sister   . Alzheimer's disease Sister   . Heart disease Mother   . Breast cancer Sister   . Leukemia Other   . Kidney disease Neg Hx     Social History   Tobacco Use  . Smoking status: Former Smoker    Packs/day: 1.00    Years: 45.00    Pack years: 45.00    Types: Cigarettes    Quit date: 06/16/1999    Years since quitting: 21.6  . Smokeless tobacco: Never Used  . Tobacco comment: quit 15 years ago  Substance Use Topics  . Alcohol use: No    Alcohol/week: 0.0 standard drinks  . Drug use: No    Home Medications Prior to Admission medications   Medication Sig Start Date End Date Taking? Authorizing Provider  amLODipine (NORVASC) 10 MG tablet Take 1 tablet (10 mg total) by mouth daily. Skip dose if SBP <130 mmHg Patient not taking: No sig reported 11/22/20 12/22/20  Val Riles, MD  cetirizine (ZYRTEC) 5 MG tablet Take 1 tablet (5 mg total) by mouth daily. 09/12/20   Jaynee Eagles, PA-C  ciprofloxacin (CIPRO) 250 MG tablet Take 1 tablet (250 mg total) by mouth 2 (two) times daily for 3 days. 01/16/21 01/19/21  Lisette Abu, PA-C  diclofenac Sodium (VOLTAREN) 1 % GEL Apply 2 g topically 4 (four) times daily. 11/14/20   Volney American, PA-C  donepezil (ARICEPT) 5 MG tablet TAKE 1 TABLET BY MOUTH EVERYDAY AT BEDTIME 10/20/20   Bedsole, Amy E, MD  escitalopram (LEXAPRO) 20 MG tablet TAKE 1 TABLET BY MOUTH EVERY DAY 10/23/20   Bedsole, Amy E, MD  midodrine (PROAMATINE) 5 MG tablet Take 1 tablet (5 mg total) by mouth daily as needed (if SBP <100 and stop Amlodipine). Patient not taking: Reported on 01/08/2021 11/21/20   Val Riles, MD  Multiple Vitamins-Minerals (MULTIVITAMIN WITH MINERALS) tablet Take 1 tablet by mouth daily.    [provider]  ondansetron (ZOFRAN ODT)  4 MG disintegrating tablet Take 1 tablet (4 mg total) by mouth every 8 (eight) hours as needed for up to 5 days. 01/14/21 01/19/21  Vallarie Mare M, PA-C  TART CHERRY PO Take 1 tablet by mouth in the morning and at bedtime.    [provider]  tiotropium (SPIRIVA HANDIHALER) 18 MCG inhalation capsule INHALE 1 CAPSULE VIA HANDIHALER ONCE DAILY AT THE SAME TIME EVERY DAY 09/16/20   Bedsole, Amy E, MD  Vitamin D, Ergocalciferol, (DRISDOL) 1.25 MG (50000 UNIT) CAPS capsule TAKE 1 CAPSULE (50,000 UNITS TOTAL) BY MOUTH EVERY FRIDAY. 10/16/20   Jinny Sanders, MD    Allergies    Lovastatin, Statins, Sulfa antibiotics, Cephalosporins, Codeine, and Niacin  Review of Systems   Review of Systems  Unable to perform ROS: Mental status change    Physical Exam Updated Vital Signs BP (!) 174/67   Pulse 76   Temp 97.7 F (36.5 C) (Oral)   Resp 18   Ht 5\' 3"  (1.6 m)   Wt 70.3 kg   SpO2 99%   BMI 27.46 kg/m   Physical Exam Vitals and nursing note reviewed.  HENT:     Head: Atraumatic.     Mouth/Throat:     Mouth: Mucous membranes are moist.  Eyes:     Pupils: Pupils are equal, round, and reactive to light.  Cardiovascular:     Rate and Rhythm: Normal rate.  Pulmonary:     Effort: Pulmonary effort is normal.  Abdominal:     Tenderness: There is no abdominal tenderness.  Musculoskeletal:        General: No tenderness.     Cervical back: Neck supple.  Skin:    General: Skin is warm.     Capillary Refill: Capillary refill takes less than 2 seconds.  Neurological:     Mental Status: She is alert.     Comments: Mild confusion.  Difficulty identifying granddaughter but able to give appropriate answers.  Right-sided mild facial droop.  No drift.  Difficulty raising both legs equally reportedly chronic.  Somewhat unsteady attempting to get from wheelchair to bed.  Required assistance which would be unusual for her.     ED Results / Procedures / Treatments   Labs (all labs ordered are  listed, but only abnormal results are displayed) Labs Reviewed  CBC - Abnormal; Notable for the following components:      Result Value   WBC 12.5 (*)    Hemoglobin 11.8 (*)    All other components within normal limits  DIFFERENTIAL - Abnormal; Notable for the following components:   Monocytes Absolute 1.9 (*)    Abs Immature Granulocytes 0.19 (*)    All other components within normal limits  COMPREHENSIVE METABOLIC PANEL - Abnormal; Notable for the following components:   Glucose, Bld 196 (*)    BUN 37 (*)    Creatinine, Ser 1.81 (*)    Calcium 8.6 (*)    Albumin 3.0 (*)    GFR, Estimated 27 (*)    All other components within normal limits  I-STAT CHEM 8, ED - Abnormal; Notable for the following components:   BUN 38 (*)    Creatinine, Ser 1.90 (*)    Glucose, Bld 190 (*)    All other components within normal limits  RESP PANEL BY RT-PCR (FLU A&B, COVID) ARPGX2  URINE CULTURE  ETHANOL  PROTIME-INR  APTT  RAPID URINE DRUG SCREEN, HOSP PERFORMED  URINALYSIS, ROUTINE W REFLEX MICROSCOPIC    EKG None  Radiology CT HEAD CODE STROKE WO CONTRAST  Result Date: 01/19/2021 CLINICAL DATA:  Code stroke.  Aphasia/confusion. EXAM: CT HEAD WITHOUT CONTRAST TECHNIQUE: Contiguous axial images were obtained from the base of the skull through the vertex without intravenous contrast. COMPARISON:  09/15/2017. FINDINGS: Brain: Approximately 7 mm thick right cerebral convexity subdural fluid collection. This collection is largely intermediate density; however, there is an area of linear hyperdensity anteriorly (series 2, image 16) that is consistent with acute/recent hemorrhage. Mild mass effect on the right frontal lobe due to the patient's brain atrophy with approximately 2 mm of leftward midline shift at the foramen of [place]ro. Similar volume loss, greatest in the left occipital lobe from prior insult. Similar  ex vacuo ventricular dilation without evidence of progressive ventriculomegaly. Similar  advanced patchy white matter hypoattenuation, most likely related to chronic microvascular ischemic disease. Vascular: No hyperdense vessel identified. Skull: Small high right posterior scalp contusion. No acute fracture. Similar skull lesions, as described on the prior CT head. Sinuses/Orbits: Left sphenoid sinus air-fluid level. Other: No mastoid effusions. Dr. Maurine Minister paged at 4:07 PM for call of report. Awaiting call back. IMPRESSION: 1. Approximately 7 mm thick right cerebral convexity subdural hemorrhage. While largely intermediate density, there is an area of linear hyperdensity anteriorly that is consistent with acute/recent hemorrhage. Mild mass effect on the right frontal lobe due to the patient's brain atrophy with approximately 2 mm of leftward midline shift. 2. No evidence of acute large vascular territory infarct. 3. Small high right posterior scalp contusion without acute fracture. 4. Advanced chronic microvascular ischemic disease and atrophy, as detailed above. 5. Left sphenoid sinus air-fluid level. Electronically Signed   By: Margaretha Sheffield MD   On: 01/19/2021 16:13    Procedures Procedures   Medications Ordered in ED Medications  levETIRAcetam (KEPPRA) 250 mg in sodium chloride 0.9 % 100 mL IVPB (has no administration in time range)    Or  levETIRAcetam (KEPPRA) tablet 250 mg (has no administration in time range)    ED Course  I have reviewed the triage vital signs and the nursing notes.  Pertinent labs & imaging results that were available during my care of the patient were reviewed by me and considered in my medical decision making (see chart for details).    MDM Rules/Calculators/A&P                          Patient Libby Maw with confusion.  Facial droop ears had for the last few days.  Currently on antibiotics for UTI.  Although it looks as if it was a somewhat indeterminant urinalysis at [facility].  No cultures been sent.  Patient has been worsening the last couple days.   Worse today.  Does have facial droop.  Code stroke Called Found Subdural on CT.  Discussed with Dr. Reatha Armour from neurosurgery.  No acute intervention at this time.  Neurology had recommended Keppra but neurosurgery thinks with the size she will not need it.  Recommends follow-up CT tomorrow.  Will admit to internal medicine for further monitoring.  CRITICAL CARE Performed by: Davonna Belling Total critical care time: 30 minutes Critical care time was exclusive of separately billable procedures and treating other patients. Critical care was necessary to treat or prevent imminent or life-threatening deterioration. Critical care was time spent personally by me on the following activities: development of treatment plan with patient and/or surrogate as well as nursing, discussions with consultants, evaluation of patient's response to treatment, examination of patient, obtaining history from patient or surrogate, ordering and performing treatments and interventions, ordering and review of laboratory studies, ordering and review of radiographic studies, pulse oximetry and re-evaluation of patient's condition.  Final Clinical Impression(s) / ED Diagnoses Final diagnoses:  Subdural hematoma [facility])    Rx / DC Orders ED Discharge Orders    None       Davonna Belling, MD 01/19/21 1726    Davonna Belling, MD 01/19/21 [phone]

## 2021-01-19 NOTE — H&P (Signed)
History and Physical    Jill [name] QZR:[id number] DOB: [birth year] DOA: 01/19/2021  PCP: Jill Sanders, [name]    Patient coming from:  Home   Chief Complaint:  Altered mental status   HPI: Jill [name] is a 87 y.o. female seen in ed with complaints of facial droop and mental status change.  Per report symptoms are intermittent, patient symptoms have been going on since Thursday.  He was recently seen at [facility] and treated for a urinary tract infection.  Even with treatment for her UTI patient's mental status was getting worse and she had a right facial droop.  Patient has been unsteady with some difficulty with gait and balance.  Patient has been more confused.  Patient did have a fall few weeks ago.  At baseline does have mild dementia but does recognize her granddaughter which she thinks is her niece at the moment. Patient was seen by neurology and neurosurgery.  About 2 weeks ago patient fell and hit her right side of the head and about a week ago on May 20 she fell a second time but did not hit her head.  Since May 27 patient has been reportedly intermittently confused with some transient slurred speech facial asymmetry, per granduaghter pt has had slurred speech and confusion. She lives with her and pt has fallen 7 times since December.Pt has also sustained left hip fracture that is non operative and had been doing well.    Pt has past medical history of allergy to sulfa, cephalosporin, codeine, niacin, lovastatin, statin.  Patient also has past medical history of HTN, Rec UTI, dm II,vit d def, depression, allergic rhinitis.   ED Course:  Vitals:   01/19/21 1700 01/19/21 1730 01/19/21 1800 01/19/21 1830  BP: (!) 174/67 (!) 187/79 (!) 177/87 137/79  Pulse: 76 76 79 79  Resp: 18 (!) 21 18 (!) 26  Temp:   97.8 F (36.6 C)   TempSrc:   Oral   SpO2: 99% 98% 99% 97%  Weight:      Height:      In ed pt is alert,awake and oriented to self but intermittently is confused and has  dementia per granddaughter. In ed pt is cooperative and BP is elevated,and ct head noncontrast done shows  Small right sided subdural hematoma without significant mass effect. Labs shows elevated glucose of 196 and creatinine of 1.81 and wbc count of 12.5 and hb of 11.8, resp panel is negative for flu and covid.  Review of Systems:  Review of Systems  Unable to perform ROS: Other  Eyes: Positive for blurred vision.  Respiratory: Negative.   Cardiovascular: Negative.   Neurological: Negative.  Negative for focal weakness, seizures, loss of consciousness, weakness and headaches.   Past Medical History:  Diagnosis Date  . Allergy   . Arthritis    osteoarthritis   . Chronic kidney disease   . Complication of anesthesia    difficult waking   . Depression   . Diabetes mellitus   . Diverticula, colon   . Family history of anesthesia complication    Son is difficult intubation  . Hyperlipidemia   . Hypertension   . Vasovagal syncope 2006   Negative cardiac workup-myoview, echo    Past Surgical History:  Procedure Laterality Date  . ABDOMINAL HYSTERECTOMY  1977   fibroid  . JOINT REPLACEMENT  2009   rt hip  . TOTAL HIP ARTHROPLASTY Left 05/12/2014   dr Mayer Camel  . TOTAL HIP ARTHROPLASTY Left  05/12/2014   Procedure: TOTAL HIP ARTHROPLASTY;  Surgeon: Kerin Salen, [name];  Location: [facility];  Service: Orthopedics;  Laterality: Left;     reports that she quit smoking about 21 years ago. Her smoking use included cigarettes. She has a 45.00 pack-year smoking history. She has never used smokeless tobacco. She reports that she does not drink alcohol and does not use drugs.  Allergies  Allergen Reactions  . Lovastatin Other (See Comments)    REACTION: leg pain  . Statins Other (See Comments)    REACTION: leg cramps, weakness  . Sulfa Antibiotics Hives and Itching  . Cephalosporins   . Codeine Rash  . Niacin Rash    Flushing also    Family History  Problem Relation Age of Onset  .  COPD Brother   . Heart disease Brother   . Diabetes Brother   . Lymphoma Brother   . Alzheimer's disease Brother   . Pancreatic cancer Sister   . Alzheimer's disease Sister   . Heart disease Mother   . Breast cancer Sister   . Leukemia Other   . Kidney disease Neg Hx     Prior to Admission medications   Medication Sig Start Date End Date Taking? Authorizing Provider  amLODipine (NORVASC) 10 MG tablet Take 1 tablet (10 mg total) by mouth daily. Skip dose if SBP <130 mmHg Patient not taking: No sig reported 11/22/20 12/22/20  Jill Riles, [name]  cetirizine (ZYRTEC) 5 MG tablet Take 1 tablet (5 mg total) by mouth daily. 09/12/20   Jill [name]  ciprofloxacin (CIPRO) 250 MG tablet Take 1 tablet (250 mg total) by mouth 2 (two) times daily for 3 days. 01/16/21 01/19/21  Jill [name], [name]  diclofenac Sodium (VOLTAREN) 1 % GEL Apply 2 g topically 4 (four) times daily. 11/14/20   Jill American, [name]  donepezil (ARICEPT) 5 MG tablet TAKE 1 TABLET BY MOUTH EVERYDAY AT BEDTIME 10/20/20   Jill [name]  escitalopram (LEXAPRO) 20 MG tablet TAKE 1 TABLET BY MOUTH EVERY DAY 10/23/20   Jill [name]  midodrine (PROAMATINE) 5 MG tablet Take 1 tablet (5 mg total) by mouth daily as needed (if SBP <100 and stop Amlodipine). Patient not taking: Reported on 01/08/2021 11/21/20   Jill Riles, [name]  Multiple Vitamins-Minerals (MULTIVITAMIN WITH MINERALS) tablet Take 1 tablet by mouth daily.    Provider, Historical, [name]  ondansetron (ZOFRAN ODT) 4 MG disintegrating tablet Take 1 tablet (4 mg total) by mouth every 8 (eight) hours as needed for up to 5 days. 01/14/21 01/19/21  Jill [name]  TART CHERRY PO Take 1 tablet by mouth in the morning and at bedtime.    Provider, Historical, [name]  tiotropium (SPIRIVA HANDIHALER) 18 MCG inhalation capsule INHALE 1 CAPSULE VIA HANDIHALER ONCE DAILY AT THE SAME TIME EVERY DAY 09/16/20   Jill [name]  Vitamin D, Ergocalciferol, (DRISDOL) 1.25 MG (50000  UNIT) CAPS capsule TAKE 1 CAPSULE (50,000 UNITS TOTAL) BY MOUTH EVERY FRIDAY. 10/16/20   Jill Sanders, [name]    Physical Exam: Vitals:   01/19/21 1700 01/19/21 1730 01/19/21 1800 01/19/21 1830  BP: (!) 174/67 (!) 187/79 (!) 177/87 137/79  Pulse: 76 76 79 79  Resp: 18 (!) 21 18 (!) 26  Temp:   97.8 F (36.6 C)   TempSrc:   Oral   SpO2: 99% 98% 99% 97%  Weight:      Height:       Physical  Exam Vitals and nursing note reviewed.  Constitutional:      General: She is not in acute distress.    Appearance: Normal appearance. She is not ill-appearing, toxic-appearing or diaphoretic.  HENT:     Head: Normocephalic and atraumatic.     Right Ear: Hearing and external ear normal.     Left Ear: Hearing and external ear normal.     Nose: Nose normal.     Mouth/Throat:     Lips: Pink.     Mouth: Mucous membranes are moist.     Tongue: Tongue does not deviate from midline.  Eyes:     Extraocular Movements: Extraocular movements intact.     Pupils: Pupils are equal, round, and reactive to light.  Neck:     Vascular: No carotid bruit.  Cardiovascular:     Rate and Rhythm: Normal rate and regular rhythm.     Pulses: Normal pulses.     Heart sounds: Normal heart sounds.  Pulmonary:     Effort: Pulmonary effort is normal.     Breath sounds: Normal breath sounds.  Abdominal:     General: Bowel sounds are normal. There is no distension.     Palpations: Abdomen is soft. There is no mass.     Tenderness: There is no abdominal tenderness. There is no guarding.     Hernia: No hernia is present.  Musculoskeletal:        General: Normal range of motion.     Right lower leg: No edema.     Left lower leg: No edema.  Skin:    General: Skin is warm.  Neurological:     General: No focal deficit present.     Mental Status: She is alert and oriented to person, place, and time.     Cranial Nerves: Cranial nerves are intact. No cranial nerve deficit.     Sensory: Sensation is intact.     Motor:  Motor function is intact.     Deep Tendon Reflexes:     Reflex Scores:      Bicep reflexes are 2+ on the right side and 2+ on the left side.      Patellar reflexes are 1+ on the right side and 1+ on the left side. Psychiatric:        Mood and Affect: Mood normal.        Behavior: Behavior normal. Behavior is cooperative.    Labs on Admission: I have personally reviewed following labs and imaging studies  No results for input(s): CKTOTAL, CKMB, TROPONINI in the last 72 hours. Lab Results  Component Value Date   WBC 12.5 (H) 01/19/2021   HGB 12.2 01/19/2021   HCT 36.0 01/19/2021   MCV 95.1 01/19/2021   PLT 355 01/19/2021    Recent Labs  Lab 01/19/21 1531 01/19/21 1602  NA 136 139  K 4.2 4.3  CL 102 104  CO2 24  --   BUN 37* 38*  CREATININE 1.81* 1.90*  CALCIUM 8.6*  --   PROT 6.5  --   BILITOT 0.6  --   ALKPHOS 93  --   ALT 22  --   AST 27  --   GLUCOSE 196* 190*   Lab Results  Component Value Date   CHOL 204 (H) 08/08/2018   HDL 61.50 08/08/2018   LDLCALC 117 (H) 08/08/2018   TRIG 128.0 08/08/2018   Lab Results  Component Value Date   DDIMER 5.80 (H) 02/03/2015   Invalid input(s): POCBNP  Urinalysis    Component Value Date/Time   COLORURINE YELLOW (A) 01/14/2021 1638   APPEARANCEUR CLEAR (A) 01/14/2021 1638   APPEARANCEUR Cloudy (A) 01/19/2016 1338   LABSPEC 1.014 01/14/2021 1638   PHURINE 5.0 01/14/2021 1638   GLUCOSEU NEGATIVE 01/14/2021 1638   HGBUR NEGATIVE 01/14/2021 [place] 01/14/2021 1638   BILIRUBINUR neg 12/28/2017 1229   BILIRUBINUR Negative 01/19/2016 [place] 01/14/2021 1638   PROTEINUR 30 (A) 01/14/2021 1638   UROBILINOGEN 0.2 11/09/2020 1834   NITRITE NEGATIVE 01/14/2021 1638   LEUKOCYTESUR SMALL (A) 01/14/2021 1638   COVID-19 Labs No results for input(s): DDIMER, FERRITIN, LDH, CRP in the last 72 hours.  Lab Results  Component Value Date   SARSCOV2NAA NEGATIVE 01/19/2021   SARSCOV2NAA  NEGATIVE 11/14/2020   SARSCOV2NAA POSITIVE (A) 09/12/2020    Radiological Exams on Admission: CT HEAD CODE STROKE WO CONTRAST Result Date: 01/19/2021 CLINICAL DATA:  Code stroke.  Aphasia/confusion. EXAM: CT HEAD WITHOUT CONTRAST TECHNIQUE: Contiguous axial images were obtained from the base of the skull through the vertex without intravenous contrast. COMPARISON:  09/15/2017. FINDINGS: Brain: Approximately 7 mm thick right cerebral convexity subdural fluid collection. This collection is largely intermediate density; however, there is an area of linear hyperdensity anteriorly (series 2, image 16) that is consistent with acute/recent hemorrhage. Mild mass effect on the right frontal lobe due to the patient's brain atrophy with approximately 2 mm of leftward midline shift at the foramen of Monro. Similar volume loss, greatest in the left occipital lobe from prior insult. Similar ex vacuo ventricular dilation without evidence of progressive ventriculomegaly. Similar advanced patchy white matter hypoattenuation, most likely related to chronic microvascular ischemic disease. Vascular: No hyperdense vessel identified. Skull: Small high right posterior scalp contusion. No acute fracture. Similar skull lesions, as described on the prior CT head. Sinuses/Orbits: Left sphenoid sinus air-fluid level. Other: No mastoid effusions. Dr. Maurine Minister paged at 4:07 PM for call of report. Awaiting call back.  IMPRESSION:  1. Approximately 7 mm thick right cerebral convexity subdural hemorrhage. While largely intermediate density, there is an area of linear hyperdensity anteriorly that is consistent with acute/recent hemorrhage. Mild mass effect on the right frontal lobe due to the patient's brain atrophy with approximately 2 mm of leftward midline shift.  2. No evidence of acute large vascular territory infarct.  3. Small high right posterior scalp contusion without acute fracture.  4. Advanced chronic microvascular ischemic  disease and atrophy, as detailed above.  5. Left sphenoid sinus air-fluid level.  Electronically Signed   By: Margaretha Sheffield [name]   On: 01/19/2021 16:13    EKG: Independently reviewed.  Sinus rhythm 89, QTc 382, nonspecific ST-T wave changes in V2-V4.   Assessment/Plan Principal Problem:   Subdural hematoma (HCC) Active Problems:   Type 2 diabetes mellitus with diabetic neuropathy, unspecified (HCC)   Hypertension associated with diabetes ([place])   DNR (do not resuscitate)   Recurrent UTI   Subdural heamotma: Being followed and managed by neurology and neurosurgery.  Goal bp is systolic of 409 and below.  Hold antiplatelet  To prevent progression of bleed. Fall / aspiration/ seizure precaution.  No AED per neurosurgery.  Repeat ct scan of head to follow size. PT once repeat ct head is unchanged until then bedrest   DM II: Ssi/ accuchecks.  HTN: Resumed amlodipine at 10 mg .  DNR status changed in chart.  Rec UTI; Pt has completed abx therapy and we will obtain culture.  CKD: We will renalyl dose all meds and avoid contrast studies.  Leucocytosis: suspect this is related to SDH. We will follow and manage.  If infectious we will start broad spectrum antibiotics.      DVT prophylaxis:  SCD'd  Code Status:  Full Code  Family Communication:  Nohemi, Nicklaus (Granddaughter)  [phone] (Mobile)  Disposition Plan:  TBD   Consults called:  Neurosurgery.  Admission status: Inpatient     Para Skeans [name] [facility] [phone] How to contact the [facility] Attending or Consulting provider [place] or covering provider during after hours [place], for this patient.    1. Check the care team in [facility] and look for a) attending/consulting [place] provider listed and b) the [facility] team listed 2. Log into [email or web address] and use [place]'s universal password to access. If you do not have the password, please contact the hospital operator. 3. Locate the [facility] provider  you are looking for under [facility] and page to a number that you can be directly reached. 4. If you still have difficulty reaching the provider, please page the [facility] (Director on Call) for the Hospitalists listed on amion for assistance. [email or web address] Password [facility] 01/19/2021, 6:59 PM

## 2021-01-19 NOTE — ED Triage Notes (Signed)
Friday, family noticed slurred speech and facial droop along with AMS.

## 2021-01-19 NOTE — ED Notes (Signed)
neurology Provider at bedside.

## 2021-01-20 ENCOUNTER — Inpatient Hospital Stay (HOSPITAL_COMMUNITY): Payer: Medicare PPO

## 2021-01-20 DIAGNOSIS — S065X9A Traumatic subdural hemorrhage with loss of consciousness of unspecified duration, initial encounter: Secondary | ICD-10-CM | POA: Diagnosis not present

## 2021-01-20 LAB — URINALYSIS, ROUTINE W REFLEX MICROSCOPIC
Bilirubin Urine: NEGATIVE
Glucose, UA: NEGATIVE mg/dL
Ketones, ur: NEGATIVE mg/dL
Nitrite: NEGATIVE
Protein, ur: 30 mg/dL — AB
Specific Gravity, Urine: 1.014 (ref 1.005–1.030)
pH: 6 (ref 5.0–8.0)

## 2021-01-20 LAB — RAPID URINE DRUG SCREEN, HOSP PERFORMED
Amphetamines: NOT DETECTED
Barbiturates: NOT DETECTED
Benzodiazepines: NOT DETECTED
Cocaine: NOT DETECTED
Opiates: NOT DETECTED
Tetrahydrocannabinol: NOT DETECTED

## 2021-01-20 NOTE — Progress Notes (Signed)
PROGRESS NOTE    Jill [name]  JIR:[id number] DOB: 05-05-1932 DOA: 01/19/2021 PCP: Jinny Sanders, MD     Brief Narrative:  Jill [name] is an 87 year old female with past medical history significant for hypertension, diabetes, mild dementia who presented to the hospital with complaints of facial droop, mental status change.  Symptoms were intermittent in nature, ongoing since Thursday 5/26.  Patient was seen at [facility], treated for urinary tract infection, however her mentation continued to get worse.  She also had an steadiness, difficulty with gait and balance, some confusion.  She did have 2 falls recently.  Work-up in the emergency department revealed small right-sided subdural hematoma.  Neurology and neurosurgery were consulted.  New events last 24 hours / Subjective: Patient feeling well, denies any headaches, vision changes, speech deficits, chest pain, abdominal pain, nausea or vomiting.  She is quite comfortable in bed.  Granddaughter is at bedside.  Assessment & Plan:   Principal Problem:   Subdural hematoma (HCC) Active Problems:   Type 2 diabetes mellitus with diabetic neuropathy, unspecified ([place])   Hypertension associated with diabetes ([place])   DNR (do not resuscitate)   Recurrent UTI   Subdural hematoma -Repeat CT head remains stable -Neurosurgery recommends outpatient follow-up in 1 month with repeat CT brain -Neurology following -EEG pending -PT OT  Hypertension -Continue Norvasc  Dementia -Continue Aricept  Recent UTI -Patient has completed antibiotic therapy as outpatient -Urinalysis unremarkable, urine culture is pending  CKD stage IV -Stable  DVT prophylaxis:  SCDs Start: 01/19/21 1843  Code Status:     Code Status Orders  (From admission, onward)         Start     Ordered   01/19/21 1859  Do not attempt resuscitation (DNR)  Continuous       Question Answer Comment  In the event of cardiac or respiratory ARREST Do not call a "code  blue"   In the event of cardiac or respiratory ARREST Do not perform Intubation, CPR, defibrillation or ACLS   In the event of cardiac or respiratory ARREST Use medication by any route, position, wound care, and other measures to relive pain and suffering. May use oxygen, suction and manual treatment of airway obstruction as needed for comfort.      01/19/21 1859        Code Status History    Date Active Date Inactive Code Status Order ID Comments User Context   01/19/2021 1850 01/19/2021 1859 DNR [id number]  Para Skeans, MD ED   11/14/2020 1959 11/21/2020 1742 DNR [id number]  Elwyn Reach, MD ED   05/31/2018 1604 06/02/2018 1603 DNR [id number]  Merton Border, MD Inpatient   08/08/2016 1703 08/09/2016 1822 Full Code [id number]  Waldemar Dickens, MD ED   01/27/2015 0351 02/01/2015 1251 DNR [id number]  Lavina Hamman, MD Inpatient   05/16/2014 0314 05/19/2014 1856 DNR [id number]  Ivor Costa, MD Inpatient   05/12/2014 1522 05/15/2014 1613 Full Code [id number]  Kerin Salen, MD Inpatient   Advance Care Planning Activity     Family Communication: Granddaughter at bedside Disposition Plan:  Status is: Inpatient  Remains inpatient appropriate because:Inpatient level of care appropriate due to severity of illness   Dispo: The patient is from: Home              Anticipated d/c is to: Home              Patient currently is not medically stable to d/c.  Difficult to place patient No      Consultants:   Neurology  Neurosurgery  Procedures:   None  Antimicrobials:  Anti-infectives (From admission, onward)   None        Objective: Vitals:   01/20/21 0700 01/20/21 0835 01/20/21 0848 01/20/21 1149  BP:  (!) 177/72 (!) 177/72 (!) 153/64  Pulse: 73 76 76 78  Resp: 20 18 20 18   Temp:  97.9 F (36.6 C) 97.9 F (36.6 C) 97.7 F (36.5 C)  TempSrc:  Oral Axillary Oral  SpO2: 97% 97% 97%   Weight:      Height:       No intake or output data in the 24 hours ending 01/20/21  1357 Filed Weights   01/19/21 1532  Weight: 70.3 kg    Examination:  General exam: Appears calm and comfortable  Respiratory system: Clear to auscultation. Respiratory effort normal. No respiratory distress. No conversational dyspnea.  Cardiovascular system: S1 & S2 heard, RRR. No murmurs. No pedal edema. Gastrointestinal system: Abdomen is nondistended, soft and nontender. Normal bowel sounds heard. Central nervous system: Alert and oriented.  Speech clear.  Extremities: Symmetric in appearance  Skin: No rashes, lesions or ulcers on exposed skin  Psychiatry: Judgement and insight appear normal. Mood & affect appropriate.   Data Reviewed: I have personally reviewed following labs and imaging studies  CBC: Recent Labs  Lab 01/14/21 1638 01/19/21 1531 01/19/21 1602  WBC 12.7* 12.5*  --   NEUTROABS 6.4 6.4  --   HGB 11.3* 11.8* 12.2  HCT 35.9* 37.0 36.0  MCV 94.7 95.1  --   PLT 216 355  --    Basic Metabolic Panel: Recent Labs  Lab 01/14/21 1638 01/19/21 1531 01/19/21 1602  NA 138 136 139  K 4.5 4.2 4.3  CL 105 102 104  CO2 25 24  --   GLUCOSE 141* 196* 190*  BUN 39* 37* 38*  CREATININE 2.07* 1.81* 1.90*  CALCIUM 9.0 8.6*  --    GFR: Estimated Creatinine Clearance: 20 mL/min (A) (by C-G formula based on SCr of 1.9 mg/dL (H)). Liver Function Tests: Recent Labs  Lab 01/14/21 1638 01/19/21 1531  AST 17 27  ALT 10 22  ALKPHOS 76 93  BILITOT 1.2 0.6  PROT 7.0 6.5  ALBUMIN 3.6 3.0*   No results for input(s): LIPASE, AMYLASE in the last 168 hours. No results for input(s): AMMONIA in the last 168 hours. Coagulation Profile: Recent Labs  Lab 01/19/21 1531  INR 1.1   Cardiac Enzymes: No results for input(s): CKTOTAL, CKMB, CKMBINDEX, TROPONINI in the last 168 hours. BNP (last 3 results) No results for input(s): PROBNP in the last 8760 hours. HbA1C: No results for input(s): HGBA1C in the last 72 hours. CBG: No results for input(s): GLUCAP in the last 168  hours. Lipid Profile: No results for input(s): CHOL, HDL, LDLCALC, TRIG, CHOLHDL, LDLDIRECT in the last 72 hours. Thyroid Function Tests: No results for input(s): TSH, T4TOTAL, FREET4, T3FREE, THYROIDAB in the last 72 hours. Anemia Panel: No results for input(s): VITAMINB12, FOLATE, FERRITIN, TIBC, IRON, RETICCTPCT in the last 72 hours. Sepsis Labs: No results for input(s): PROCALCITON, LATICACIDVEN in the last 168 hours.  Recent Results (from the past 240 hour(s))  Resp Panel by RT-PCR (Flu A&B, Covid) Nasopharyngeal Swab     Status: None   Collection Time: 01/19/21  3:38 PM   Specimen: Nasopharyngeal Swab; Nasopharyngeal(NP) swabs in vial transport medium  Result Value Ref Range Status  SARS Coronavirus 2 by RT PCR NEGATIVE NEGATIVE Final    Comment: (NOTE) SARS-CoV-2 target nucleic acids are NOT DETECTED.  The SARS-CoV-2 RNA is generally detectable in upper respiratory specimens during the acute phase of infection. The lowest concentration of SARS-CoV-2 viral copies this assay can detect is 138 copies/mL. A negative result does not preclude SARS-Cov-2 infection and should not be used as the sole basis for treatment or other patient management decisions. A negative result may occur with  improper specimen collection/handling, submission of specimen other than nasopharyngeal swab, presence of viral mutation(s) within the areas targeted by this assay, and inadequate number of viral copies(<138 copies/mL). A negative result must be combined with clinical observations, patient history, and epidemiological information. The expected result is Negative.  Fact Sheet for Patients:  EntrepreneurPulse.com.au  Fact Sheet for Healthcare Providers:  IncredibleEmployment.be  This test is no t yet approved or cleared by the [place] FDA and  has been authorized for detection and/or diagnosis of SARS-CoV-2 by FDA under an Emergency Use Authorization  (EUA). This EUA will remain  in effect (meaning this test can be used) for the duration of the COVID-19 declaration under Section 564(b)(1) of the Act, 21 U.S.C.section 360bbb-3(b)(1), unless the authorization is terminated  or revoked sooner.       Influenza A by PCR NEGATIVE NEGATIVE Final   Influenza B by PCR NEGATIVE NEGATIVE Final    Comment: (NOTE) The Xpert Xpress SARS-CoV-2/FLU/RSV plus assay is intended as an aid in the diagnosis of influenza from Nasopharyngeal swab specimens and should not be used as a sole basis for treatment. Nasal washings and aspirates are unacceptable for Xpert Xpress SARS-CoV-2/FLU/RSV testing.  Fact Sheet for Patients: EntrepreneurPulse.com.au  Fact Sheet for Healthcare Providers: IncredibleEmployment.be  This test is not yet approved or cleared by the [place] FDA and has been authorized for detection and/or diagnosis of SARS-CoV-2 by FDA under an Emergency Use Authorization (EUA). This EUA will remain in effect (meaning this test can be used) for the duration of the COVID-19 declaration under Section 564(b)(1) of the Act, 21 U.S.C. section 360bbb-3(b)(1), unless the authorization is terminated or revoked.  Performed at [facility], [place] [address], [place], [place] [address]       Radiology Studies: CT HEAD WO CONTRAST  Result Date: 01/20/2021 CLINICAL DATA:  87 year old female code stroke presentation with mixed density right subdural hematoma. EXAM: CT HEAD WITHOUT CONTRAST TECHNIQUE: Contiguous axial images were obtained from the base of the skull through the vertex without intravenous contrast. COMPARISON:  Head CT 01/19/2021 and earlier. FINDINGS: Brain: Mixed density 7 mm right mid frontal convexity subdural hematoma is stable (coronal image 23). Small volume of hyperdense blood is unchanged. Trace leftward midline shift is stable. No other extra-axial collection or new intracranial  hemorrhage identified. Basilar cisterns remain normal. No ventriculomegaly. Stable gray-white matter differentiation throughout the brain. Chronic encephalomalacia in the left occipital pole. Patchy and confluent bilateral white matter hypodensity and heterogeneity in the deep gray nuclei. Small chronic right cerebellar infarct. Vascular: Calcified atherosclerosis at the skull base. No suspicious intracranial vascular hyperdensity. Skull: No fracture identified. Right calvarium benign hemangioma suspected on series 4, image 58, was intrinsically T1 hyperintense on a 2017 MRI. Sinuses/Orbits: Stable small fluid level in the left sphenoid sinus. Otherwise well aerated. Other: No acute orbit or scalp soft tissue finding. IMPRESSION: 1. Stable small mixed density right side subdural hematoma since yesterday, up to 7 mm. Stable trace leftward midline shift. 2. No  new intracranial abnormality. Chronic ischemic and small vessel disease. Electronically Signed   By: Genevie Ann M.D.   On: 01/20/2021 06:47   MR CERVICAL SPINE WO CONTRAST  Result Date: 01/19/2021 CLINICAL DATA:  Gait and balance disturbance.  Recent fall. EXAM: MRI CERVICAL SPINE WITHOUT CONTRAST TECHNIQUE: Multiplanar, multisequence MR imaging of the cervical spine was performed. No intravenous contrast was administered. COMPARISON:  None. FINDINGS: Alignment: Grade 1 anterolisthesis at C4-5 Vertebrae: No fracture, evidence of discitis, or bone lesion. Cord: Normal signal and morphology. Posterior Fossa, vertebral arteries, paraspinal tissues: Negative. Incompletely visualized cerebral volume loss. Disc levels: C1-2: Unremarkable. C2-3: Mild facet hypertrophy. No disc herniation. There is no spinal canal stenosis. No neural foraminal stenosis. C3-4: Right-greater-than-left uncovertebral hypertrophy. There is no spinal canal stenosis. Mild right neural foraminal stenosis. C4-5: Facet hypertrophy with mild uncovertebral spurring. There is no spinal canal  stenosis. Mild left neural foraminal stenosis. C5-6: Left-greater-than-right facet hypertrophy. Small disc bulge with bilateral uncovertebral hypertrophy. There is no spinal canal stenosis. Moderate bilateral neural foraminal stenosis. C6-7: Left uncovertebral hypertrophy. There is no spinal canal stenosis. Moderate left neural foraminal stenosis. C7-T1: Normal disc space and facet joints. There is no spinal canal stenosis. No neural foraminal stenosis. IMPRESSION: 1. No acute abnormality of the cervical spine. 2. Moderate bilateral C5-6 and left C6-7 neural foraminal stenosis. 3. No spinal canal stenosis. Electronically Signed   By: Ulyses Jarred M.D.   On: 01/19/2021 22:26   MR THORACIC SPINE WO CONTRAST  Result Date: 01/19/2021 CLINICAL DATA:  Fall EXAM: MRI THORACIC SPINE WITHOUT CONTRAST TECHNIQUE: Multiplanar, multisequence MR imaging of the thoracic spine was performed. No intravenous contrast was administered. COMPARISON:  None. FINDINGS: Alignment:  Physiologic. Vertebrae: No fracture, evidence of discitis, or bone lesion. Cord:  Normal signal and morphology. Paraspinal and other soft tissues: Negative Disc levels: No spinal canal or neural foraminal stenosis. Mild multilevel disc space narrowing. IMPRESSION: No acute abnormality of the thoracic spine. No spinal canal or neural foraminal stenosis. Electronically Signed   By: Ulyses Jarred M.D.   On: 01/19/2021 22:33   CT HEAD CODE STROKE WO CONTRAST  Result Date: 01/19/2021 CLINICAL DATA:  Code stroke.  Aphasia/confusion. EXAM: CT HEAD WITHOUT CONTRAST TECHNIQUE: Contiguous axial images were obtained from the base of the skull through the vertex without intravenous contrast. COMPARISON:  09/15/2017. FINDINGS: Brain: Approximately 7 mm thick right cerebral convexity subdural fluid collection. This collection is largely intermediate density; however, there is an area of linear hyperdensity anteriorly (series 2, image 16) that is consistent with  acute/recent hemorrhage. Mild mass effect on the right frontal lobe due to the patient's brain atrophy with approximately 2 mm of leftward midline shift at the foramen of [place]ro. Similar volume loss, greatest in the left occipital lobe from prior insult. Similar ex vacuo ventricular dilation without evidence of progressive ventriculomegaly. Similar advanced patchy white matter hypoattenuation, most likely related to chronic microvascular ischemic disease. Vascular: No hyperdense vessel identified. Skull: Small high right posterior scalp contusion. No acute fracture. Similar skull lesions, as described on the prior CT head. Sinuses/Orbits: Left sphenoid sinus air-fluid level. Other: No mastoid effusions. Dr. Maurine Minister paged at 4:07 PM for call of report. Awaiting call back. IMPRESSION: 1. Approximately 7 mm thick right cerebral convexity subdural hemorrhage. While largely intermediate density, there is an area of linear hyperdensity anteriorly that is consistent with acute/recent hemorrhage. Mild mass effect on the right frontal lobe due to the patient's brain atrophy with approximately 2 mm of leftward  midline shift. 2. No evidence of acute large vascular territory infarct. 3. Small high right posterior scalp contusion without acute fracture. 4. Advanced chronic microvascular ischemic disease and atrophy, as detailed above. 5. Left sphenoid sinus air-fluid level. Electronically Signed   By: Margaretha Sheffield MD   On: 01/19/2021 16:13      Scheduled Meds: . amLODipine  10 mg Oral Daily  . donepezil  5 mg Oral QHS  . escitalopram  20 mg Oral Daily  . umeclidinium bromide  1 puff Inhalation Daily   Continuous Infusions: . sodium chloride 20 mL/hr at 01/19/21 2055     LOS: 1 day      Time spent: 25 minutes   Dessa Phi, DO [facility] 01/20/2021, 1:57 PM   Available via Epic secure chat 7am-7pm After these hours, please refer to coverage provider listed on [email or web address]

## 2021-01-20 NOTE — Procedures (Signed)
Patient Name: Jill [name]  MRN: [id number]  Epilepsy Attending: Lora Havens  Referring Physician/Provider: Dr Lesleigh Noe Date: 01/20/2021 Duration: 25.04 mins  Patient history: 87 year old with fluctuating neurological symptoms likely multifactorial secondary to UTI, subdural, on background of dementia. EEG to evaluate for seizure  Level of alertness: Awake, asleep  AEDs during EEG study: None  Technical aspects: This EEG study was done with scalp electrodes positioned according to the 10-20 International system of electrode placement. Electrical activity was acquired at a sampling rate of 500Hz  and reviewed with a high frequency filter of 70Hz  and a low frequency filter of 1Hz . EEG data were recorded continuously and digitally stored.   Description: The posterior dominant rhythm consists of 8-9 Hz activity of moderate voltage (25-35 uV) seen predominantly in posterior head regions, symmetric and reactive to eye opening and eye closing. Sleep was characterized by vertex waves, sleep spindles (12 to 14 Hz), maximal frontocentral region. Hyperventilation and photic stimulation were not performed.     IMPRESSION: This study is within normal limits. No seizures or epileptiform discharges were seen throughout the recording.  Athol Bolds Barbra Sarks

## 2021-01-20 NOTE — Progress Notes (Addendum)
Neurology Progress Note  Patient ID: Jill [name] is a 87 y.o. with a medical history significant for diabetes mellitus, hypertension, hyperlipidemia, and chronic kidney disease who presented to the ED for evaluation of approximately 4 days of transient slurred speech, increased confusion, and intermittent facial asymmetry   Subjective: -Sleepy, no acute complaints  Exam: Vitals:   01/20/21 0835 01/20/21 0848  BP: (!) 177/72 (!) 177/72  Pulse: 76 76  Resp: 18 20  Temp: 97.9 F (36.6 C) 97.9 F (36.6 C)  SpO2: 97% 97%   Gen: In bed, comfortable  Resp: non-labored breathing, no grossly audible wheezing Cardiac: Perfusing extremities well  Abd: soft, nt  Neuro: MS: Sleeping but awakens for lower extremity strength exam CN: Face symmetric with activation Motor: Bilateral hip flexion pain limited, lifts both hips briefly antigravity more briskly done yesterday but not with full range of motion (2/5).  Knee extension 4 -/5 due to pain on the right, 4/5 due to pain on the left.  Knee flexion 5/5 and foot dorsi and plantar flexion 5/5 bilaterally DTR: 2+ and symmetric at the patella, downgoing toes bilaterally  MRI C-spine and T-spine ordered by primary team reviewed personally Mild degenerative changes without significant spinal canal stenosis or acute spinal process.  Moderate bilateral C5/6 and left C6/7 neuroforaminal stenosis  Impression: 87 year old with fluctuating neurological symptoms likely multifactorial secondary to UTI, subdural, on background of dementia  Recommendations: -Appreciate management of subdural hemorrhage per neurosurgery -Routine EEG today to rule out epileptogenic activity contributing to her waxing/waning symptoms -Neurology will follow up EEG and sign off if this is negative for epileptogenic activity  Work-up and plan discussed with family at bedside.  Greater than 25 minutes were spent in the care of this patient today, of which over half was at  bedside performing examination and discussing with family as documented above  Lesleigh Noe MD-PhD [facility] [phone]  Available 7 AM to 7 PM, outside these hours please contact Neurologist on call listed on AMION

## 2021-01-20 NOTE — Progress Notes (Signed)
EEG completed, results pending. 

## 2021-01-20 NOTE — ED Notes (Signed)
Attempted report 

## 2021-01-20 NOTE — Evaluation (Signed)
Physical Therapy Evaluation Patient Details Name: Jill [name] MRN: [id number] DOB: [birth year] Today's Date: 01/20/2021   History of Present Illness  87 yo female presents to [facility] on 5/31 with weakness, confusion, slurred speech, aphasia and facial droop occurring off and on over weekend. Pt with recent falls, + head trauma. [place] shows right-sided convexity subdural hematoma, bilateral hygromas without significant mass-effect, no NRSY intervention planned at this time. Pt also with recent UTI and treatment. EEG 6/1 WFL. PMH includes diabetes mellitus, hypertension, dementia, hyperlipidemia, and chronic kidney disease, vasovagal syncope, depression, OA, R THA 2009, L THA 2015.  Clinical Impression   Pt presents with generalized weakness, AMS but close to baseline at time of eval per family, impaired standing balance with history of falls, and decreased activity tolerance vs baseline. Pt to benefit from acute PT to address deficits. Pt ambulated very short room distance with RW, overall requiring min-mod assist for mobility tasks. Session somewhat limited due to pt needing assist with stool clean up, fatigued quickly after this. Pt's family wish to take pt home at d/c, recommending HHPT. PT to progress mobility as tolerated, and will continue to follow acutely.      Follow Up Recommendations Home health PT (family requests home with HHPT, decline SNF)    Equipment Recommendations  None recommended by PT    Recommendations for Other Services       Precautions / Restrictions Precautions Precautions: Fall Restrictions Weight Bearing Restrictions: No      Mobility  Bed Mobility Overal bed mobility: Needs Assistance Bed Mobility: Supine to Sit     Supine to sit: Mod assist;HOB elevated     General bed mobility comments: Mod assist for trunk elevation, LE translation to EOB, and scooting to EOB. Icnreased time, step by step sequencing cues.    Transfers Overall transfer level: Needs  assistance Equipment used: Rolling walker (2 wheeled) Transfers: Sit to/from [facility] Sit to Stand: Mod assist Stand pivot transfers: Mod assist       General transfer comment: Mod assist for rise, steady, and pivot to [facility] towards pt's R.  Ambulation/Gait Ambulation/Gait assistance: Min assist Gait Distance (Feet): 5 Feet Assistive device: Rolling walker (2 wheeled) Gait Pattern/deviations: Step-through pattern;Decreased stride length;Trunk flexed;Shuffle Gait velocity: decr   General Gait Details: min assist to steady, guide RW, verbal cuing for upright posture and navigating from [facility] to recliner.  Stairs            Wheelchair Mobility    Modified Rankin (Stroke Patients Only) Modified Rankin (Stroke Patients Only) Pre-Morbid Rankin Score: Moderately severe disability Modified Rankin: Moderately severe disability     Balance Overall balance assessment: Needs assistance;History of Falls Sitting-balance support: Feet supported;Single extremity supported Sitting balance-Leahy Scale: Fair     Standing balance support: Bilateral upper extremity supported;During functional activity Standing balance-Leahy Scale: Poor Standing balance comment: posterior bias                             Pertinent Vitals/Pain Pain Assessment: Faces Faces Pain Scale: No hurt Pain Intervention(s): Monitored during session;Limited activity within patient's tolerance;Repositioned    Home Living Family/patient expects to be discharged to:: Private residence Living Arrangements: Other relatives (granddaughter) Available Help at Discharge: Family;Available 24 hours/day (granddaughter is a Theme park manager, has flexible hours. Pt's children live closeby) Type of Home: House Home Access: Stairs to enter Entrance Stairs-Rails: Right Entrance Stairs-Number of Steps: 1 Home Layout: One level Home Equipment: Shower  seat;Walker - 2 wheels;Walker - 4 wheels;Wheelchair -  manual Additional Comments: per pt's granddaughter, the shower seat has wheels on it (?)    Prior Function Level of Independence: Needs assistance   Gait / Transfers Assistance Needed: pt uses cane or RW for mobility, sometimes refuses to use it  ADL's / Homemaking Assistance Needed: Pt requires assist for bathing, clean up at times        Hand Dominance   Dominant Hand: Right    Extremity/Trunk Assessment   Upper Extremity Assessment Upper Extremity Assessment: Defer to OT evaluation    Lower Extremity Assessment Lower Extremity Assessment: Generalized weakness    Cervical / Trunk Assessment Cervical / Trunk Assessment: Kyphotic  Communication   Communication: HOH  Cognition Arousal/Alertness: Awake/alert Behavior During Therapy: WFL for tasks assessed/performed Overall Cognitive Status: History of cognitive impairments - at baseline                                 General Comments: History of dementia; not oriented to hospital or situation. Follows one-step commands      General Comments General comments (skin integrity, edema, etc.): stool incontinent upon PT arrival, requires total assist for clean up    Exercises     Assessment/Plan    PT Assessment Patient needs continued PT services  PT Problem List Decreased strength;Decreased mobility;Decreased activity tolerance;Decreased balance;Decreased knowledge of use of DME;Decreased safety awareness;Decreased cognition       PT Treatment Interventions DME instruction;Therapeutic activities;Gait training;Therapeutic exercise;Patient/family education;Balance training;Stair training;Functional mobility training;Neuromuscular re-education    PT Goals (Current goals can be found in the Care Plan section)  Acute Rehab PT Goals PT Goal Formulation: With patient Time For Goal Achievement: 02/03/21 Potential to Achieve Goals: Good    Frequency Min 4X/week   Barriers to discharge         Co-evaluation               AM-PAC PT "6 Clicks" Mobility  Outcome Measure Help needed turning from your back to your side while in a flat bed without using bedrails?: A Little Help needed moving from lying on your back to sitting on the side of a flat bed without using bedrails?: A Lot Help needed moving to and from a bed to a chair (including a wheelchair)?: A Lot Help needed standing up from a chair using your arms (e.g., wheelchair or bedside chair)?: A Little Help needed to walk in hospital room?: A Little Help needed climbing 3-5 steps with a railing? : A Lot 6 Click Score: 15    End of Session Equipment Utilized During Treatment: Gait belt Activity Tolerance: Patient tolerated treatment well;Patient limited by fatigue Patient left: in chair;with chair alarm set;with call bell/phone within reach Nurse Communication: Mobility status PT Visit Diagnosis: Other abnormalities of gait and mobility (R26.89);Difficulty in walking, not elsewhere classified (R26.2)    Time: 2426-8341 PT Time Calculation (min) (ACUTE ONLY): 32 min   Charges:   PT Evaluation $PT Eval Low Complexity: 1 Low PT Treatments $Therapeutic Activity: 8-22 mins        Stacie Glaze, PT DPT Acute Rehabilitation Services Pager [phone]  Office [phone]   Louis Matte 01/20/2021, 6:28 PM

## 2021-01-20 NOTE — Progress Notes (Signed)
   Providing Compassionate, Quality Care - Together  NEUROSURGERY PROGRESS NOTE   S: No issues overnight.  Denies any new complaints  O: EXAM:  BP (!) 177/72 (BP Location: Left Arm)   Pulse 76   Temp 97.9 F (36.6 C) (Axillary)   Resp 20   Ht 5\' 3"  (1.6 m)   Wt 70.3 kg   SpO2 97%   BMI 27.46 kg/m   Awake, alert, oriented, x1 Speech fluent, appropriate  PERRLA CNs grossly intact, slight left-sided facial droop, stable Full strength BUE/BLE Sensory intact No drift  ASSESSMENT:  87 y.o. female with   1.  Small right acute on chronic subdural hematoma without significant mass-effect 2.  Small bilateral hygromas, stable  PLAN: -Repeat CT this morning stable -Recommend PT/OT eval -Follow-up in 1 month with repeat CT brain -No need for antiepileptics -No acute surgical intervention, please call with questions    Thank you for allowing me to participate in this patient's care.  Please do not hesitate to call with questions or concerns.   Elwin Sleight, [facility] Cell: [phone]

## 2021-01-21 ENCOUNTER — Other Ambulatory Visit (HOSPITAL_COMMUNITY): Payer: Self-pay

## 2021-01-21 ENCOUNTER — Telehealth: Payer: Self-pay

## 2021-01-21 ENCOUNTER — Ambulatory Visit: Payer: Medicare Other | Admitting: Urology

## 2021-01-21 DIAGNOSIS — S065X9A Traumatic subdural hemorrhage with loss of consciousness of unspecified duration, initial encounter: Secondary | ICD-10-CM | POA: Diagnosis not present

## 2021-01-21 LAB — CBC
HCT: 30.7 % — ABNORMAL LOW (ref 36.0–46.0)
Hemoglobin: 9.9 g/dL — ABNORMAL LOW (ref 12.0–15.0)
MCH: 29.9 pg (ref 26.0–34.0)
MCHC: 32.2 g/dL (ref 30.0–36.0)
MCV: 92.7 fL (ref 80.0–100.0)
Platelets: 313 10*3/uL (ref 150–400)
RBC: 3.31 MIL/uL — ABNORMAL LOW (ref 3.87–5.11)
RDW: 13.9 % (ref 11.5–15.5)
WBC: 12.5 10*3/uL — ABNORMAL HIGH (ref 4.0–10.5)
nRBC: 0 % (ref 0.0–0.2)

## 2021-01-21 LAB — BASIC METABOLIC PANEL
Anion gap: 9 (ref 5–15)
BUN: 28 mg/dL — ABNORMAL HIGH (ref 8–23)
CO2: 24 mmol/L (ref 22–32)
Calcium: 8.3 mg/dL — ABNORMAL LOW (ref 8.9–10.3)
Chloride: 104 mmol/L (ref 98–111)
Creatinine, Ser: 1.43 mg/dL — ABNORMAL HIGH (ref 0.44–1.00)
GFR, Estimated: 36 mL/min — ABNORMAL LOW (ref >60)
Glucose, Bld: 103 mg/dL — ABNORMAL HIGH (ref 70–99)
Potassium: 4.1 mmol/L (ref 3.5–5.1)
Sodium: 137 mmol/L (ref 135–145)

## 2021-01-21 MED ORDER — NITROFURANTOIN MONOHYD MACRO 100 MG PO CAPS: 100.0000 mg | ORAL_CAPSULE | Freq: Two times a day (BID) | ORAL | Status: DC

## 2021-01-21 MED ORDER — SODIUM CHLORIDE 0.9 % IV SOLN
1.0000 g | INTRAVENOUS | Status: DC
  Administered 2021-01-21: 1 g via INTRAVENOUS
  Filled 2021-01-21: qty 10

## 2021-01-21 MED ORDER — CEPHALEXIN 500 MG PO CAPS
500.0000 mg | ORAL_CAPSULE | Freq: Four times a day (QID) | ORAL | 0 refills | Status: AC
  Filled 2021-01-21: qty 20, 5d supply, fill #0

## 2021-01-21 NOTE — Discharge Summary (Signed)
Physician Discharge Summary  Jill [name] GXQ:[id number] DOB: April 20, 1932 DOA: 01/19/2021  PCP: Jinny Sanders, MD  Admit date: 01/19/2021 Discharge date: 01/21/2021  Admitted From: Home Disposition:  Home with home health  Recommendations for Outpatient Follow-up:  1. Follow up with PCP in 1 week 2. Follow up with Dr. Reatha Armour, Neurosurgery, in 1 month for repeat CT head 3. Follow up final urine culture result  Discharge Condition: Stable CODE STATUS: DNR  Diet recommendation: Regular   Brief/Interim Summary: Jill [name] is an 87 year old female with past medical history significant for hypertension, diabetes, mild dementia who presented to the hospital with complaints of facial droop, mental status change.  Symptoms were intermittent in nature, ongoing since Thursday 5/26.  Patient was seen at [facility], treated for urinary tract infection, however her mentation continued to get worse.  She also had an steadiness, difficulty with gait and balance, some confusion.  She did have 2 falls recently.  Work-up in the emergency department revealed small right-sided subdural hematoma.  Neurology and neurosurgery were consulted. Repeat CT Head remained stable, no surgical intervention was necessary. She underwent EEG which was unremarkable, no antiepileptic was necessary. She worked with PT and rec for home health PT. She was started on rocephin for UTI (GNR on culture). Pharmacy determined that patient has tolerated cephalosporins in the past without allergic reaction.   Discharge Diagnoses:  Principal Problem:   Subdural hematoma (HCC) Active Problems:   Type 2 diabetes mellitus with diabetic neuropathy, unspecified ([place])   Hypertension associated with diabetes ([place])   DNR (do not resuscitate)   Recurrent UTI   Subdural hematoma -Repeat CT head remains stable -Neurosurgery recommends outpatient follow-up in 1 month with repeat CT brain -Neurology following -EEG unremarkable  -PT rec home  health   Hypertension -Continue Norvasc  Dementia -Continue Aricept  Recent UTI -Patient has completed antibiotic therapy as outpatient -Urine culture showing >100,000 GNR. Started rocephin --> keflex on dc   CKD stage IV -Stable   Discharge Instructions  Discharge Instructions    Call MD for:  difficulty breathing, headache or visual disturbances   Complete by: As directed    Call MD for:  extreme fatigue   Complete by: As directed    Call MD for:  persistant dizziness or light-headedness   Complete by: As directed    Call MD for:  persistant nausea and vomiting   Complete by: As directed    Call MD for:  severe uncontrolled pain   Complete by: As directed    Call MD for:  temperature >100.4   Complete by: As directed    Discharge instructions   Complete by: As directed    You were cared for by a hospitalist during your hospital stay. If you have any questions about your discharge medications or the care you received while you were in the hospital after you are discharged, you can call the unit and ask to speak with the hospitalist on call if the hospitalist that took care of you is not available. Once you are discharged, your primary care physician will handle any further medical issues. Please note that NO REFILLS for any discharge medications will be authorized once you are discharged, as it is imperative that you return to your primary care physician (or establish a relationship with a primary care physician if you do not have one) for your aftercare needs so that they can reassess your need for medications and monitor your lab values.   Increase activity  slowly   Complete by: As directed      Allergies as of 01/21/2021      Reactions   Lovastatin Other (See Comments)   REACTION: leg pain   Statins Other (See Comments)   REACTION: leg cramps, weakness   Sulfa Antibiotics Hives, Itching   Cephalosporins    Codeine Rash   Niacin Rash   Flushing also       Medication List    STOP taking these medications   ciprofloxacin 250 MG tablet Commonly known as: Cipro   ondansetron 4 MG disintegrating tablet Commonly known as: Zofran ODT     TAKE these medications   acetaminophen 500 MG tablet Commonly known as: TYLENOL Take 1,000 mg by mouth every 6 (six) hours as needed for moderate pain or headache.   amLODipine 10 MG tablet Commonly known as: NORVASC Take 1 tablet (10 mg total) by mouth daily. Skip dose if SBP <130 mmHg   cephALEXin 500 MG capsule Commonly known as: KEFLEX Take 1 capsule (500 mg total) by mouth 4 (four) times daily for 5 days.   cetirizine 5 MG tablet Commonly known as: ZYRTEC Take 1 tablet (5 mg total) by mouth daily.   diclofenac Sodium 1 % Gel Commonly known as: Voltaren Apply 2 g topically 4 (four) times daily. What changed:   when to take this  reasons to take this   donepezil 5 MG tablet Commonly known as: ARICEPT TAKE 1 TABLET BY MOUTH EVERYDAY AT BEDTIME What changed: See the new instructions.   escitalopram 20 MG tablet Commonly known as: LEXAPRO TAKE 1 TABLET BY MOUTH EVERY DAY   midodrine 5 MG tablet Commonly known as: PROAMATINE Take 1 tablet (5 mg total) by mouth daily as needed (if SBP <100 and stop Amlodipine).   multivitamin with minerals tablet Take 1 tablet by mouth daily.   Spiriva HandiHaler 18 MCG inhalation capsule Generic drug: tiotropium INHALE 1 CAPSULE VIA HANDIHALER ONCE DAILY AT THE SAME TIME EVERY DAY What changed:   how much to take  how to take this  when to take this  additional instructions   TART CHERRY PO Take 1 tablet by mouth in the morning and at bedtime.   traMADol 50 MG tablet Commonly known as: ULTRAM Take 50 mg by mouth every 8 (eight) hours as needed for moderate pain.   Vitamin D (Ergocalciferol) 1.25 MG (50000 UNIT) Caps capsule Commonly known as: DRISDOL TAKE 1 CAPSULE (50,000 UNITS TOTAL) BY MOUTH EVERY FRIDAY.       Follow-up  Information    Dawley, Troy C, DO Follow up in 1 month(s).   Why: call for appointment Contact information: [address] [place] [address] [phone]        Jinny Sanders, MD. Schedule an appointment as soon as possible for a visit in 1 week(s).   Specialty: Family Medicine Contact information: [place] [address] [phone]              Allergies  Allergen Reactions  . Lovastatin Other (See Comments)    REACTION: leg pain  . Statins Other (See Comments)    REACTION: leg cramps, weakness  . Sulfa Antibiotics Hives and Itching  . Cephalosporins   . Codeine Rash  . Niacin Rash    Flushing also    Consultations:  Neurology  Neurosurgery   Procedures/Studies: DG Ribs Unilateral W/Chest Left  Result Date: 01/11/2021 CLINICAL DATA:  Left-sided rib pain following  fall, initial encounter EXAM: LEFT RIBS AND CHEST - 3+ VIEW COMPARISON:  None. FINDINGS: No fracture or other bone lesions are seen involving the ribs. There is no evidence of pneumothorax or pleural effusion. Both lungs are clear. Heart size and mediastinal contours are within normal limits. IMPRESSION: No rib abnormality is noted. Electronically Signed   By: Inez Catalina M.D.   On: 01/11/2021 10:24   CT HEAD WO CONTRAST  Result Date: 01/20/2021 CLINICAL DATA:  87 year old female code stroke presentation with mixed density right subdural hematoma. EXAM: CT HEAD WITHOUT CONTRAST TECHNIQUE: Contiguous axial images were obtained from the base of the skull through the vertex without intravenous contrast. COMPARISON:  Head CT 01/19/2021 and earlier. FINDINGS: Brain: Mixed density 7 mm right mid frontal convexity subdural hematoma is stable (coronal image 23). Small volume of hyperdense blood is unchanged. Trace leftward midline shift is stable. No other extra-axial collection or new intracranial hemorrhage identified. Basilar cisterns remain normal. No ventriculomegaly.  Stable gray-white matter differentiation throughout the brain. Chronic encephalomalacia in the left occipital pole. Patchy and confluent bilateral white matter hypodensity and heterogeneity in the deep gray nuclei. Small chronic right cerebellar infarct. Vascular: Calcified atherosclerosis at the skull base. No suspicious intracranial vascular hyperdensity. Skull: No fracture identified. Right calvarium benign hemangioma suspected on series 4, image 58, was intrinsically T1 hyperintense on a 2017 MRI. Sinuses/Orbits: Stable small fluid level in the left sphenoid sinus. Otherwise well aerated. Other: No acute orbit or scalp soft tissue finding. IMPRESSION: 1. Stable small mixed density right side subdural hematoma since yesterday, up to 7 mm. Stable trace leftward midline shift. 2. No new intracranial abnormality. Chronic ischemic and small vessel disease. Electronically Signed   By: Genevie Ann M.D.   On: 01/20/2021 06:47   MR CERVICAL SPINE WO CONTRAST  Result Date: 01/19/2021 CLINICAL DATA:  Gait and balance disturbance.  Recent fall. EXAM: MRI CERVICAL SPINE WITHOUT CONTRAST TECHNIQUE: Multiplanar, multisequence MR imaging of the cervical spine was performed. No intravenous contrast was administered. COMPARISON:  None. FINDINGS: Alignment: Grade 1 anterolisthesis at C4-5 Vertebrae: No fracture, evidence of discitis, or bone lesion. Cord: Normal signal and morphology. Posterior Fossa, vertebral arteries, paraspinal tissues: Negative. Incompletely visualized cerebral volume loss. Disc levels: C1-2: Unremarkable. C2-3: Mild facet hypertrophy. No disc herniation. There is no spinal canal stenosis. No neural foraminal stenosis. C3-4: Right-greater-than-left uncovertebral hypertrophy. There is no spinal canal stenosis. Mild right neural foraminal stenosis. C4-5: Facet hypertrophy with mild uncovertebral spurring. There is no spinal canal stenosis. Mild left neural foraminal stenosis. C5-6: Left-greater-than-right facet  hypertrophy. Small disc bulge with bilateral uncovertebral hypertrophy. There is no spinal canal stenosis. Moderate bilateral neural foraminal stenosis. C6-7: Left uncovertebral hypertrophy. There is no spinal canal stenosis. Moderate left neural foraminal stenosis. C7-T1: Normal disc space and facet joints. There is no spinal canal stenosis. No neural foraminal stenosis. IMPRESSION: 1. No acute abnormality of the cervical spine. 2. Moderate bilateral C5-6 and left C6-7 neural foraminal stenosis. 3. No spinal canal stenosis. Electronically Signed   By: Ulyses Jarred M.D.   On: 01/19/2021 22:26   MR THORACIC SPINE WO CONTRAST  Result Date: 01/19/2021 CLINICAL DATA:  Fall EXAM: MRI THORACIC SPINE WITHOUT CONTRAST TECHNIQUE: Multiplanar, multisequence MR imaging of the thoracic spine was performed. No intravenous contrast was administered. COMPARISON:  None. FINDINGS: Alignment:  Physiologic. Vertebrae: No fracture, evidence of discitis, or bone lesion. Cord:  Normal signal and morphology. Paraspinal and other soft tissues: Negative Disc levels: No spinal canal or  neural foraminal stenosis. Mild multilevel disc space narrowing. IMPRESSION: No acute abnormality of the thoracic spine. No spinal canal or neural foraminal stenosis. Electronically Signed   By: Ulyses Jarred M.D.   On: 01/19/2021 22:33   EEG adult  Result Date: 01/20/2021 Lora Havens, MD     01/20/2021  3:23 PM Patient Name: Jill [name] MRN: [id number] Epilepsy Attending: Lora Havens Referring Physician/Provider: Dr Lesleigh Noe Date: 01/20/2021 Duration: 25.04 mins Patient history: 87 year old with fluctuating neurological symptoms likely multifactorial secondary to UTI, subdural, on background of dementia. EEG to evaluate for seizure Level of alertness: Awake, asleep AEDs during EEG study: None Technical aspects: This EEG study was done with scalp electrodes positioned according to the 10-20 International system of electrode placement.  Electrical activity was acquired at a sampling rate of 500Hz  and reviewed with a high frequency filter of 70Hz  and a low frequency filter of 1Hz . EEG data were recorded continuously and digitally stored. Description: The posterior dominant rhythm consists of 8-9 Hz activity of moderate voltage (25-35 uV) seen predominantly in posterior head regions, symmetric and reactive to eye opening and eye closing. Sleep was characterized by vertex waves, sleep spindles (12 to 14 Hz), maximal frontocentral region. Hyperventilation and photic stimulation were not performed.   IMPRESSION: This study is within normal limits. No seizures or epileptiform discharges were seen throughout the recording. Priyanka Barbra Sarks   DG Hip Unilat W OR W/O Pelvis 2-3 Views Right  Result Date: 01/11/2021 CLINICAL DATA:  Right-sided hip pain following fall, initial encounter EXAM: DG HIP (WITH OR WITHOUT PELVIS) 2-3V RIGHT COMPARISON:  02/02/2017 FINDINGS: Bilateral hip replacements are noted. Degenerative change of the lumbar spine is seen. No evidence of periprosthetic fracture are seen. No soft tissue abnormality is noted. IMPRESSION: Bilateral hip replacements. No acute bony abnormality is noted. Electronically Signed   By: Inez Catalina M.D.   On: 01/11/2021 10:25   CT HEAD CODE STROKE WO CONTRAST  Result Date: 01/19/2021 CLINICAL DATA:  Code stroke.  Aphasia/confusion. EXAM: CT HEAD WITHOUT CONTRAST TECHNIQUE: Contiguous axial images were obtained from the base of the skull through the vertex without intravenous contrast. COMPARISON:  09/15/2017. FINDINGS: Brain: Approximately 7 mm thick right cerebral convexity subdural fluid collection. This collection is largely intermediate density; however, there is an area of linear hyperdensity anteriorly (series 2, image 16) that is consistent with acute/recent hemorrhage. Mild mass effect on the right frontal lobe due to the patient's brain atrophy with approximately 2 mm of leftward midline  shift at the foramen of Monro. Similar volume loss, greatest in the left occipital lobe from prior insult. Similar ex vacuo ventricular dilation without evidence of progressive ventriculomegaly. Similar advanced patchy white matter hypoattenuation, most likely related to chronic microvascular ischemic disease. Vascular: No hyperdense vessel identified. Skull: Small high right posterior scalp contusion. No acute fracture. Similar skull lesions, as described on the prior CT head. Sinuses/Orbits: Left sphenoid sinus air-fluid level. Other: No mastoid effusions. Dr. Maurine Minister paged at 4:07 PM for call of report. Awaiting call back. IMPRESSION: 1. Approximately 7 mm thick right cerebral convexity subdural hemorrhage. While largely intermediate density, there is an area of linear hyperdensity anteriorly that is consistent with acute/recent hemorrhage. Mild mass effect on the right frontal lobe due to the patient's brain atrophy with approximately 2 mm of leftward midline shift. 2. No evidence of acute large vascular territory infarct. 3. Small high right posterior scalp contusion without acute fracture. 4. Advanced chronic microvascular ischemic disease and  atrophy, as detailed above. 5. Left sphenoid sinus air-fluid level. Electronically Signed   By: Margaretha Sheffield MD   On: 01/19/2021 16:13       Discharge Exam: Vitals:   01/21/21 0401 01/21/21 0819  BP: (!) 174/66 (!) 167/72  Pulse: 80 86  Resp: 17 17  Temp: 98.6 F (37 C) 98.1 F (36.7 C)  SpO2: 96% 96%    General: Pt is alert, awake, not in acute distress Cardiovascular: RRR, S1/S2 +, no edema Respiratory: CTA bilaterally, no wheezing, no rhonchi, no respiratory distress, no conversational dyspnea  Abdominal: Soft, NT, ND, bowel sounds + Extremities: no edema, no cyanosis Psych: Normal mood and affect, stable judgement and insight     The results of significant diagnostics from this hospitalization (including imaging, microbiology, ancillary  and laboratory) are listed below for reference.     Microbiology: Recent Results (from the past 240 hour(s))  Resp Panel by RT-PCR (Flu A&B, Covid) Nasopharyngeal Swab     Status: None   Collection Time: 01/19/21  3:38 PM   Specimen: Nasopharyngeal Swab; Nasopharyngeal(NP) swabs in vial transport medium  Result Value Ref Range Status   SARS Coronavirus 2 by RT PCR NEGATIVE NEGATIVE Final    Comment: (NOTE) SARS-CoV-2 target nucleic acids are NOT DETECTED.  The SARS-CoV-2 RNA is generally detectable in upper respiratory specimens during the acute phase of infection. The lowest concentration of SARS-CoV-2 viral copies this assay can detect is 138 copies/mL. A negative result does not preclude SARS-Cov-2 infection and should not be used as the sole basis for treatment or other patient management decisions. A negative result may occur with  improper specimen collection/handling, submission of specimen other than nasopharyngeal swab, presence of viral mutation(s) within the areas targeted by this assay, and inadequate number of viral copies(<138 copies/mL). A negative result must be combined with clinical observations, patient history, and epidemiological information. The expected result is Negative.  Fact Sheet for Patients:  EntrepreneurPulse.com.au  Fact Sheet for Healthcare Providers:  IncredibleEmployment.be  This test is no t yet approved or cleared by the [place] FDA and  has been authorized for detection and/or diagnosis of SARS-CoV-2 by FDA under an Emergency Use Authorization (EUA). This EUA will remain  in effect (meaning this test can be used) for the duration of the COVID-19 declaration under Section 564(b)(1) of the Act, 21 U.S.C.section 360bbb-3(b)(1), unless the authorization is terminated  or revoked sooner.       Influenza A by PCR NEGATIVE NEGATIVE Final   Influenza B by PCR NEGATIVE NEGATIVE Final    Comment:  (NOTE) The Xpert Xpress SARS-CoV-2/FLU/RSV plus assay is intended as an aid in the diagnosis of influenza from Nasopharyngeal swab specimens and should not be used as a sole basis for treatment. Nasal washings and aspirates are unacceptable for Xpert Xpress SARS-CoV-2/FLU/RSV testing.  Fact Sheet for Patients: EntrepreneurPulse.com.au  Fact Sheet for Healthcare Providers: IncredibleEmployment.be  This test is not yet approved or cleared by the [place] FDA and has been authorized for detection and/or diagnosis of SARS-CoV-2 by FDA under an Emergency Use Authorization (EUA). This EUA will remain in effect (meaning this test can be used) for the duration of the COVID-19 declaration under Section 564(b)(1) of the Act, 21 U.S.C. section 360bbb-3(b)(1), unless the authorization is terminated or revoked.  Performed at [facility], [place] [address] [place], [place] [address]   Urine culture     Status: Abnormal (Preliminary result)   Collection Time: 01/19/21  4:06  PM   Specimen: Urine, Random  Result Value Ref Range Status   Specimen Description URINE, RANDOM  Final   Special Requests   Final    NONE Performed at [facility], [address] [place], [place] [address]    Culture >=100,000 COLONIES/mL GRAM NEGATIVE RODS (A)  Final   Report Status PENDING  Incomplete     Labs: BNP (last 3 results) No results for input(s): BNP in the last 8760 hours. Basic Metabolic Panel: Recent Labs  Lab 01/14/21 1638 01/19/21 1531 01/19/21 1602 01/21/21 0220  NA 138 136 139 137  K 4.5 4.2 4.3 4.1  CL 105 102 104 104  CO2 25 24  --  24  GLUCOSE 141* 196* 190* 103*  BUN 39* 37* 38* 28*  CREATININE 2.07* 1.81* 1.90* 1.43*  CALCIUM 9.0 8.6*  --  8.3*   Liver Function Tests: Recent Labs  Lab 01/14/21 1638 01/19/21 1531  AST 17 27  ALT 10 22  ALKPHOS 76 93  BILITOT 1.2 0.6  PROT 7.0 6.5  ALBUMIN 3.6 3.0*   No results for  input(s): LIPASE, AMYLASE in the last 168 hours. No results for input(s): AMMONIA in the last 168 hours. CBC: Recent Labs  Lab 01/14/21 1638 01/19/21 1531 01/19/21 1602 01/21/21 0220  WBC 12.7* 12.5*  --  12.5*  NEUTROABS 6.4 6.4  --   --   HGB 11.3* 11.8* 12.2 9.9*  HCT 35.9* 37.0 36.0 30.7*  MCV 94.7 95.1  --  92.7  PLT 216 355  --  313   Cardiac Enzymes: No results for input(s): CKTOTAL, CKMB, CKMBINDEX, TROPONINI in the last 168 hours. BNP: Invalid input(s): POCBNP CBG: No results for input(s): GLUCAP in the last 168 hours. D-Dimer No results for input(s): DDIMER in the last 72 hours. Hgb A1c No results for input(s): HGBA1C in the last 72 hours. Lipid Profile No results for input(s): CHOL, HDL, LDLCALC, TRIG, CHOLHDL, LDLDIRECT in the last 72 hours. Thyroid function studies No results for input(s): TSH, T4TOTAL, T3FREE, THYROIDAB in the last 72 hours.  Invalid input(s): FREET3 Anemia work up No results for input(s): VITAMINB12, FOLATE, FERRITIN, TIBC, IRON, RETICCTPCT in the last 72 hours. Urinalysis    Component Value Date/Time   COLORURINE YELLOW 01/20/2021 0648   APPEARANCEUR CLEAR 01/20/2021 0648   APPEARANCEUR Cloudy (A) 01/19/2016 1338   LABSPEC 1.014 01/20/2021 0648   PHURINE 6.0 01/20/2021 0648   GLUCOSEU NEGATIVE 01/20/2021 0648   HGBUR SMALL (A) 01/20/2021 0648   BILIRUBINUR NEGATIVE 01/20/2021 0648   BILIRUBINUR neg 12/28/2017 1229   BILIRUBINUR Negative 01/19/2016 1338   KETONESUR NEGATIVE 01/20/2021 0648   PROTEINUR 30 (A) 01/20/2021 0648   UROBILINOGEN 0.2 11/09/2020 1834   NITRITE NEGATIVE 01/20/2021 0648   LEUKOCYTESUR SMALL (A) 01/20/2021 0648   Sepsis Labs Invalid input(s): PROCALCITONIN,  WBC,  LACTICIDVEN Microbiology Recent Results (from the past 240 hour(s))  Resp Panel by RT-PCR (Flu A&B, Covid) Nasopharyngeal Swab     Status: None   Collection Time: 01/19/21  3:38 PM   Specimen: Nasopharyngeal Swab; Nasopharyngeal(NP) swabs in  vial transport medium  Result Value Ref Range Status   SARS Coronavirus 2 by RT PCR NEGATIVE NEGATIVE Final    Comment: (NOTE) SARS-CoV-2 target nucleic acids are NOT DETECTED.  The SARS-CoV-2 RNA is generally detectable in upper respiratory specimens during the acute phase of infection. The lowest concentration of SARS-CoV-2 viral copies this assay can detect is 138 copies/mL. A negative result does not  preclude SARS-Cov-2 infection and should not be used as the sole basis for treatment or other patient management decisions. A negative result may occur with  improper specimen collection/handling, submission of specimen other than nasopharyngeal swab, presence of viral mutation(s) within the areas targeted by this assay, and inadequate number of viral copies(<138 copies/mL). A negative result must be combined with clinical observations, patient history, and epidemiological information. The expected result is Negative.  Fact Sheet for Patients:  EntrepreneurPulse.com.au  Fact Sheet for Healthcare Providers:  IncredibleEmployment.be  This test is no t yet approved or cleared by the [place] FDA and  has been authorized for detection and/or diagnosis of SARS-CoV-2 by FDA under an Emergency Use Authorization (EUA). This EUA will remain  in effect (meaning this test can be used) for the duration of the COVID-19 declaration under Section 564(b)(1) of the Act, 21 U.S.C.section 360bbb-3(b)(1), unless the authorization is terminated  or revoked sooner.       Influenza A by PCR NEGATIVE NEGATIVE Final   Influenza B by PCR NEGATIVE NEGATIVE Final    Comment: (NOTE) The Xpert Xpress SARS-CoV-2/FLU/RSV plus assay is intended as an aid in the diagnosis of influenza from Nasopharyngeal swab specimens and should not be used as a sole basis for treatment. Nasal washings and aspirates are unacceptable for Xpert Xpress SARS-CoV-2/FLU/RSV testing.  Fact  Sheet for Patients: EntrepreneurPulse.com.au  Fact Sheet for Healthcare Providers: IncredibleEmployment.be  This test is not yet approved or cleared by the [place] FDA and has been authorized for detection and/or diagnosis of SARS-CoV-2 by FDA under an Emergency Use Authorization (EUA). This EUA will remain in effect (meaning this test can be used) for the duration of the COVID-19 declaration under Section 564(b)(1) of the Act, 21 U.S.C. section 360bbb-3(b)(1), unless the authorization is terminated or revoked.  Performed at [facility], [place] [address] [place], [place] [address]   Urine culture     Status: Abnormal (Preliminary result)   Collection Time: 01/19/21  4:06 PM   Specimen: Urine, Random  Result Value Ref Range Status   Specimen Description URINE, RANDOM  Final   Special Requests   Final    NONE Performed at [facility], [place] [address] [place], [place] [address]    Culture >=100,000 COLONIES/mL GRAM NEGATIVE RODS (A)  Final   Report Status PENDING  Incomplete     Patient was seen and examined on the day of discharge and was found to be in stable condition. Time coordinating discharge: 25 minutes including assessment and coordination of care, as well as examination of the patient.   SIGNED:  Dessa Phi, DO [facility] 01/21/2021, 10:19 AM

## 2021-01-21 NOTE — TOC Transition Note (Signed)
Transition of Care [facility]) - CM/SW Discharge Note   Patient Details  Name: MALAN WERK MRN: [id number] Date of Birth: 07-18-1932  Transition of Care [facility] CM/SW Contact:  Pollie Friar, RN Phone Number: 01/21/2021, 12:41 PM   Clinical Narrative:    Pt is discharging home with [facility] services through [facility]. Pt has needed supervision at home and transport to home. No issues with home meds.    Final next level of care: Home w Home Health Services Barriers to Discharge: No Barriers Identified   Patient Goals and CMS Choice   CMS Medicare.gov Compare Post Acute Care list provided to:: Patient Represenative (must comment) Choice offered to / list presented to : Adult Children  Discharge Placement                       Discharge Plan and Services                          HH Arranged: PT HH Agency: [facility] Date [facility] Agency Contacted: 01/21/21   Representative spoke with at [place]: [place] ([place] Interventions     Readmission Risk Interventions No flowsheet data found.

## 2021-01-21 NOTE — Telephone Encounter (Signed)
Transition Care Management Follow-up Telephone Call  Date of discharge and from where: [facility]  How have you been since you were released from the hospital? Patient is doing much better. Weakness still noted.  Any questions or concerns? No  Items Reviewed:  Did the pt receive and understand the discharge instructions provided? Yes   Medications obtained and verified? Yes   Other? No   Any new allergies since your discharge? No   Dietary orders reviewed? Yes  Do you have support at home? Yes   Home Care and Equipment/Supplies: Were home health services ordered? yes If so, what is the name of the agency? [facility]  Has the agency set up a time to come to the patient's home? no Were any new equipment or medical supplies ordered?  No What is the name of the medical supply agency? N/A Were you able to get the supplies/equipment? not applicable Do you have any questions related to the use of the equipment or supplies? No  Functional Questionnaire: (I = Independent and D = Dependent) ADLs: I  Bathing/Dressing- I  Meal Prep- I  Eating- I  Maintaining continence- I  Transferring/Ambulation- I  Managing Meds- I  Follow up appointments reviewed:   PCP Hospital f/u appt confirmed? Yes  Scheduled to see Dr. Diona Browner on 01/26/2021 @ 2:20 pm.  [facility] f/u appt confirmed? follow up with Dr. Reatha Armour    Are transportation arrangements needed? No   If their condition worsens, is the pt aware to call PCP or go to the Emergency Dept.? Yes  Was the patient provided with contact information for the PCP's office or ED? Yes  Was to pt encouraged to call back with questions or concerns? Yes

## 2021-01-21 NOTE — Evaluation (Signed)
Occupational Therapy Evaluation Patient Details Name: Jill [name] MRN: [id number] DOB: 1931-09-21 Today's Date: 01/21/2021    History of Present Illness 87 yo female presents to [facility] on 5/31 with weakness, confusion, slurred speech, aphasia and facial droop occurring off and on over weekend. Pt with recent falls, + head trauma. [place] shows right-sided convexity subdural hematoma, bilateral hygromas without significant mass-effect, no NRSY intervention planned at this time. Pt also with recent UTI and treatment. EEG 6/1 WFL. PMH includes diabetes mellitus, hypertension, dementia, hyperlipidemia, and chronic kidney disease, vasovagal syncope, depression, OA, R THA 2009, L THA 2015.   Clinical Impression   Patient was admitted for the diagnosis above.  PTA she lives at her home, and her granddaughter stays with her.  The granddaughter provides oversight, cueing and assist as needed for bathing, dressing, meals, meds, community mobility, and home management.  Barriers are listed below.  She is performing much better today, and is scheduled to go home.  Currently, she is at baseline for ADL, and up to [facility] for mobility in the room at [facility].      Follow Up Recommendations  No OT follow up    Equipment Recommendations  None recommended by OT    Recommendations for Other Services       Precautions / Restrictions Precautions Precautions: Fall Restrictions Weight Bearing Restrictions: No      Mobility Bed Mobility               General bed mobility comments: up in recliner Patient Response: Cooperative  Transfers Overall transfer level: Needs assistance Equipment used: Rolling walker (2 wheeled) Transfers: Sit to/from [facility] Sit to Stand: Min guard Stand pivot transfers: Min guard            Balance Overall balance assessment: Needs assistance;History of Falls Sitting-balance support: Feet supported Sitting balance-Leahy Scale: Good     Standing  balance support: Bilateral upper extremity supported;During functional activity Standing balance-Leahy Scale: Poor Standing balance comment: needs external support                           ADL either performed or assessed with clinical judgement   ADL Overall ADL's : At baseline                                             Vision Baseline Vision/History: Wears glasses Wears Glasses: At all times Patient Visual Report: No change from baseline       Perception     Praxis      Pertinent Vitals/Pain Pain Assessment: No/denies pain Pain Intervention(s): Monitored during session     Hand Dominance Right   Extremity/Trunk Assessment Upper Extremity Assessment Upper Extremity Assessment: Overall WFL for tasks assessed   Lower Extremity Assessment Lower Extremity Assessment: Defer to PT evaluation       Communication Communication Communication: HOH   Cognition Arousal/Alertness: Awake/alert Behavior During Therapy: WFL for tasks assessed/performed Overall Cognitive Status: History of cognitive impairments - at baseline                                                      Home Living Family/patient expects to  be discharged to:: Private residence Living Arrangements: Other relatives Available Help at Discharge: Family;Available 24 hours/day Type of Home: House Home Access: Stairs to enter   Entrance Stairs-Rails: Right Home Layout: One level     Bathroom Shower/Tub: Occupational psychologist: Standard     Home Equipment: Clinical cytogeneticist - 2 wheels;Walker - 4 wheels;Wheelchair - manual          Prior Functioning/Environment Level of Independence: Needs assistance  Gait / Transfers Assistance Needed: pt uses cane or RW for mobility, sometimes refuses to use it ADL's / Homemaking Assistance Needed: Pt requires assist for bathing, clean up at times            OT Problem List: Impaired balance  (sitting and/or standing)      OT Treatment/Interventions:      OT Goals(Current goals can be found in the care plan section) Acute Rehab OT Goals Patient Stated Goal: return home and get a little stronger OT Goal Formulation: With patient Time For Goal Achievement: 01/21/21 Potential to Achieve Goals: Good  OT Frequency:     Barriers to D/C:  none noted          Co-evaluation              AM-PAC OT "6 Clicks" Daily Activity     Outcome Measure Help from another person eating meals?: None Help from another person taking care of personal grooming?: None Help from another person toileting, which includes using toliet, bedpan, or urinal?: None Help from another person bathing (including washing, rinsing, drying)?: A Little Help from another person to put on and taking off regular upper body clothing?: None Help from another person to put on and taking off regular lower body clothing?: A Little 6 Click Score: 22   End of Session Equipment Utilized During Treatment: Gait belt;Rolling walker Nurse Communication: Mobility status  Activity Tolerance: Patient tolerated treatment well Patient left: in chair;with call bell/phone within reach;with chair alarm set;with family/visitor present  OT Visit Diagnosis: Unsteadiness on feet (R26.81)                Time: 2330-0762 OT Time Calculation (min): 18 min Charges:  OT General Charges $OT Visit: 1 Visit OT Evaluation $OT Eval Moderate Complexity: 1 Mod  01/21/2021  Jill [name], OTR/L  Acute Rehabilitation Services  Office:  [phone]   Metta Clines 01/21/2021, 11:03 AM

## 2021-01-22 LAB — URINE CULTURE: Culture: >=100000 — AB

## 2021-01-25 ENCOUNTER — Other Ambulatory Visit: Payer: Self-pay | Admitting: Family Medicine

## 2021-01-26 ENCOUNTER — Encounter: Payer: Self-pay | Admitting: Family Medicine

## 2021-01-26 ENCOUNTER — Ambulatory Visit: Payer: Medicare PPO | Admitting: Family Medicine

## 2021-01-26 ENCOUNTER — Other Ambulatory Visit: Payer: Self-pay

## 2021-01-26 VITALS — BP 140/74 | HR 60 | Temp 98.0°F | Ht 62.0 in | Wt 156.2 lb

## 2021-01-26 DIAGNOSIS — S329XXD Fracture of unspecified parts of lumbosacral spine and pelvis, subsequent encounter for fracture with routine healing: Secondary | ICD-10-CM

## 2021-01-26 DIAGNOSIS — N390 Urinary tract infection, site not specified: Secondary | ICD-10-CM | POA: Diagnosis not present

## 2021-01-26 DIAGNOSIS — S065X9A Traumatic subdural hemorrhage with loss of consciousness of unspecified duration, initial encounter: Secondary | ICD-10-CM

## 2021-01-26 DIAGNOSIS — R296 Repeated falls: Secondary | ICD-10-CM

## 2021-01-26 DIAGNOSIS — F418 Other specified anxiety disorders: Secondary | ICD-10-CM | POA: Diagnosis not present

## 2021-01-26 DIAGNOSIS — S065XAA Traumatic subdural hemorrhage with loss of consciousness status unknown, initial encounter: Secondary | ICD-10-CM

## 2021-01-26 MED ORDER — HYDROXYZINE HCL 10 MG PO TABS: 5.0000 mg | ORAL_TABLET | Freq: Every day | ORAL | 0 refills | Status: DC | PRN

## 2021-01-26 NOTE — Progress Notes (Signed)
Patient ID: Jill [name], female    DOB: 1931/12/06, 87 y.o.   MRN: [id number]  This visit was conducted in person.  BP 140/74   Pulse 60   Temp 98 F (36.7 C) (Temporal)   Ht 5\' 2"  (1.575 m)   Wt 156 lb 4 oz (70.9 kg)   SpO2 97%   BMI 28.58 kg/m    CC:  Chief Complaint  Patient presents with   Hospitalization Follow-up    Subjective:   HPI: Jill [name] is a 87 y.o. female presenting on 01/26/2021 for Hospitalization Follow-up  Admitted 01/19/2021 after worsening confusion, facial droop following fall and head injury.   Head CT showed subdural hematoma    Repeat CT Head remained stable, no surgical intervention was necessary. She underwent EEG which was unremarkable, no antiepileptic was necessary. She worked with PT and rec for home health PT. She was started on rocephin for UTI (GNR on culture).    Scheduled for follow up with neurosurgery for repeat head CT in 1 month.  UTI treatment transitioned to keflex at discharge   Today she presents with her granddaughter.  grand daguhter feels she is somewhat better but keep saying that ' home does not look like home"  Able to read again, no facial droop. No headache.  if waking early in morning.. she seems more anxious.  Doing home PT for overall weakness.   They have not given the keflex given recent Cdiff. Was given rocephin in ER. No urinary urgency, no frequency, no odor, no abdominal pain.  Pelvic fracture followed by Ortho..minimal pain... not requiring tramadol. occ using tylenol. Shoulder has improved as well.   No falls.. using wheel chair  For long distance and walker around the house.   Relevant past medical, surgical, family and social history reviewed and updated as indicated. Interim medical history since our last visit reviewed. Allergies and medications reviewed and updated. Outpatient Medications Prior to Visit  Medication Sig Dispense Refill   acetaminophen (TYLENOL) 500 MG tablet Take 1,000 mg  by mouth every 6 (six) hours as needed for moderate pain or headache.     cetirizine (ZYRTEC) 5 MG tablet Take 1 tablet (5 mg total) by mouth daily. 90 tablet 0   diclofenac Sodium (VOLTAREN) 1 % GEL Apply 2 g topically 4 (four) times daily as needed.     donepezil (ARICEPT) 5 MG tablet TAKE 1 TABLET BY MOUTH EVERYDAY AT BEDTIME 90 tablet 0   escitalopram (LEXAPRO) 20 MG tablet TAKE 1 TABLET BY MOUTH EVERY DAY 90 tablet 1   midodrine (PROAMATINE) 5 MG tablet Take 1 tablet (5 mg total) by mouth daily as needed (if SBP <100 and stop Amlodipine).     Multiple Vitamins-Minerals (MULTIVITAMIN WITH MINERALS) tablet Take 1 tablet by mouth daily.     TART CHERRY PO Take 1 tablet by mouth in the morning and at bedtime.     tiotropium (SPIRIVA HANDIHALER) 18 MCG inhalation capsule INHALE 1 CAPSULE VIA HANDIHALER ONCE DAILY AT THE SAME TIME EVERY DAY 30 capsule 5   traMADol (ULTRAM) 50 MG tablet Take 50 mg by mouth every 8 (eight) hours as needed for moderate pain.     Vitamin D, Ergocalciferol, (DRISDOL) 1.25 MG (50000 UNIT) CAPS capsule TAKE 1 CAPSULE (50,000 UNITS TOTAL) BY MOUTH EVERY FRIDAY. 12 capsule 3   diclofenac Sodium (VOLTAREN) 1 % GEL Apply 2 g topically 4 (four) times daily. (Patient taking differently: Apply 2 g topically 4 (four)  times daily as needed (shoulder pain).) 100 g 1   amLODipine (NORVASC) 10 MG tablet Take 1 tablet (10 mg total) by mouth daily. Skip dose if SBP <130 mmHg 30 tablet 0   cephALEXin (KEFLEX) 500 MG capsule Take 1 capsule (500 mg total) by mouth 4 (four) times daily for 5 days. (Patient not taking: Reported on 01/26/2021) 20 capsule 0   No facility-administered medications prior to visit.     Per HPI unless specifically indicated in ROS section below Review of Systems  Constitutional:  Positive for activity change and fatigue. Negative for fever.  HENT:  Negative for congestion and ear pain.   Eyes:  Negative for pain.  Respiratory:  Negative for cough, chest  tightness and shortness of breath.   Cardiovascular:  Negative for chest pain, palpitations and leg swelling.  Gastrointestinal:  Negative for abdominal pain.  Genitourinary:  Negative for dysuria and vaginal bleeding.  Musculoskeletal:  Negative for back pain.  Neurological:  Negative for syncope, light-headedness and headaches.  Psychiatric/Behavioral:  Negative for dysphoric mood.   Objective:  BP 140/74   Pulse 60   Temp 98 F (36.7 C) (Temporal)   Ht 5\' 2"  (1.575 m)   Wt 156 lb 4 oz (70.9 kg)   SpO2 97%   BMI 28.58 kg/m   Wt Readings from Last 3 Encounters:  01/26/21 156 lb 4 oz (70.9 kg)  01/19/21 155 lb (70.3 kg)  01/14/21 158 lb 11.7 oz (72 kg)      Physical Exam Constitutional:      General: She is not in acute distress.    Appearance: Normal appearance. She is well-developed. She is not ill-appearing or toxic-appearing.     Comments: Elderly female in wheelchair  HENT:     Head: Normocephalic.     Right Ear: Hearing, tympanic membrane, ear canal and external ear normal. Tympanic membrane is not erythematous, retracted or bulging.     Left Ear: Hearing, tympanic membrane, ear canal and external ear normal. Tympanic membrane is not erythematous, retracted or bulging.     Nose: No mucosal edema or rhinorrhea.     Right Sinus: No maxillary sinus tenderness or frontal sinus tenderness.     Left Sinus: No maxillary sinus tenderness or frontal sinus tenderness.     Mouth/Throat:     Pharynx: Uvula midline.  Eyes:     General: Lids are normal. Lids are everted, no foreign bodies appreciated.     Conjunctiva/sclera: Conjunctivae normal.     Pupils: Pupils are equal, round, and reactive to light.  Neck:     Thyroid: No thyroid mass or thyromegaly.     Vascular: No carotid bruit.     Trachea: Trachea normal.  Cardiovascular:     Rate and Rhythm: Normal rate and regular rhythm.     Pulses: Normal pulses.     Heart sounds: Normal heart sounds, S1 normal and S2 normal. No  murmur heard.   No friction rub. No gallop.  Pulmonary:     Effort: Pulmonary effort is normal. No tachypnea or respiratory distress.     Breath sounds: Normal breath sounds. No decreased breath sounds, wheezing, rhonchi or rales.  Abdominal:     General: Bowel sounds are normal.     Palpations: Abdomen is soft.     Tenderness: There is no abdominal tenderness.  Musculoskeletal:     Cervical back: Normal range of motion and neck supple.     Comments: No hip or pelvic pain but  bilateral decreased range of motion, nonambulatory  Skin:    General: Skin is warm and dry.     Findings: No rash.  Neurological:     Mental Status: She is alert.  Psychiatric:        Mood and Affect: Mood is not anxious or depressed.        Speech: Speech normal.        Behavior: Behavior normal. Behavior is cooperative.        Thought Content: Thought content normal.        Judgment: Judgment normal.      Results for orders placed or performed during the hospital encounter of 01/19/21  Resp Panel by RT-PCR (Flu A&B, Covid) Nasopharyngeal Swab   Specimen: Nasopharyngeal Swab; Nasopharyngeal(NP) swabs in vial transport medium  Result Value Ref Range   SARS Coronavirus 2 by RT PCR NEGATIVE NEGATIVE   Influenza A by PCR NEGATIVE NEGATIVE   Influenza B by PCR NEGATIVE NEGATIVE  Urine culture   Specimen: Urine, Random  Result Value Ref Range   Specimen Description URINE, RANDOM    Special Requests      NONE Performed at [facility], [address] [place],  [address]    Culture >=100,000 COLONIES/mL ESCHERICHIA COLI (A)    Report Status 01/22/2021 FINAL    Organism ID, Bacteria ESCHERICHIA COLI (A)       Susceptibility   Escherichia coli - MIC*    AMPICILLIN <=2 SENSITIVE Sensitive     CEFAZOLIN <=4 SENSITIVE Sensitive     CEFEPIME <=0.12 SENSITIVE Sensitive     CEFTRIAXONE <=0.25 SENSITIVE Sensitive     CIPROFLOXACIN >=4 RESISTANT Resistant     GENTAMICIN <=1 SENSITIVE Sensitive      IMIPENEM <=0.25 SENSITIVE Sensitive     NITROFURANTOIN <=16 SENSITIVE Sensitive     TRIMETH/SULFA >=320 RESISTANT Resistant     AMPICILLIN/SULBACTAM <=2 SENSITIVE Sensitive     PIP/TAZO <=4 SENSITIVE Sensitive     * >=100,000 COLONIES/mL ESCHERICHIA COLI  Ethanol  Result Value Ref Range   Alcohol, Ethyl (B) <10 <10 mg/dL  Protime-INR  Result Value Ref Range   Prothrombin Time 14.2 11.4 - 15.2 seconds   INR 1.1 0.8 - 1.2  APTT  Result Value Ref Range   aPTT 24 24 - 36 seconds  CBC  Result Value Ref Range   WBC 12.5 (H) 4.0 - 10.5 K/uL   RBC 3.89 3.87 - 5.11 MIL/uL   Hemoglobin 11.8 (L) 12.0 - 15.0 g/dL   HCT 37.0 36.0 - 46.0 %   MCV 95.1 80.0 - 100.0 fL   MCH 30.3 26.0 - 34.0 pg   MCHC 31.9 30.0 - 36.0 g/dL   RDW 14.1 11.5 - 15.5 %   Platelets 355 150 - 400 K/uL   nRBC 0.0 0.0 - 0.2 %  Differential  Result Value Ref Range   Neutrophils Relative % 51 %   Neutro Abs 6.4 1.7 - 7.7 K/uL   Lymphocytes Relative 31 %   Lymphs Abs 3.9 0.7 - 4.0 K/uL   Monocytes Relative 15 %   Monocytes Absolute 1.9 (H) 0.1 - 1.0 K/uL   Eosinophils Relative 1 %   Eosinophils Absolute 0.1 0.0 - 0.5 K/uL   Basophils Relative 0 %   Basophils Absolute 0.0 0.0 - 0.1 K/uL   Immature Granulocytes 2 %   Abs Immature Granulocytes 0.19 (H) 0.00 - 0.07 K/uL  Comprehensive metabolic panel  Result Value Ref Range   Sodium  136 135 - 145 mmol/L   Potassium 4.2 3.5 - 5.1 mmol/L   Chloride 102 98 - 111 mmol/L   CO2 24 22 - 32 mmol/L   Glucose, Bld 196 (H) 70 - 99 mg/dL   BUN 37 (H) 8 - 23 mg/dL   Creatinine, Ser 1.81 (H) 0.44 - 1.00 mg/dL   Calcium 8.6 (L) 8.9 - 10.3 mg/dL   Total Protein 6.5 6.5 - 8.1 g/dL   Albumin 3.0 (L) 3.5 - 5.0 g/dL   AST 27 15 - 41 U/L   ALT 22 0 - 44 U/L   Alkaline Phosphatase 93 38 - 126 U/L   Total Bilirubin 0.6 0.3 - 1.2 mg/dL   GFR, Estimated 27 (L) >60 mL/min   Anion gap 10 5 - 15  Urine rapid drug screen (hosp performed)  Result Value Ref Range   Opiates NONE  DETECTED NONE DETECTED   Cocaine NONE DETECTED NONE DETECTED   Benzodiazepines NONE DETECTED NONE DETECTED   Amphetamines NONE DETECTED NONE DETECTED   Tetrahydrocannabinol NONE DETECTED NONE DETECTED   Barbiturates NONE DETECTED NONE DETECTED  Urinalysis, Routine w reflex microscopic  Result Value Ref Range   Color, Urine YELLOW YELLOW   APPearance CLEAR CLEAR   Specific Gravity, Urine 1.014 1.005 - 1.030   pH 6.0 5.0 - 8.0   Glucose, UA NEGATIVE NEGATIVE mg/dL   Hgb urine dipstick SMALL (A) NEGATIVE   Bilirubin Urine NEGATIVE NEGATIVE   Ketones, ur NEGATIVE NEGATIVE mg/dL   Protein, ur 30 (A) NEGATIVE mg/dL   Nitrite NEGATIVE NEGATIVE   Leukocytes,Ua SMALL (A) NEGATIVE   RBC / HPF 0-5 0 - 5 RBC/hpf   WBC, UA 0-5 0 - 5 WBC/hpf   Bacteria, UA RARE (A) NONE SEEN   Squamous Epithelial / LPF 0-5 0 - 5  CBC  Result Value Ref Range   WBC 12.5 (H) 4.0 - 10.5 K/uL   RBC 3.31 (L) 3.87 - 5.11 MIL/uL   Hemoglobin 9.9 (L) 12.0 - 15.0 g/dL   HCT 30.7 (L) 36.0 - 46.0 %   MCV 92.7 80.0 - 100.0 fL   MCH 29.9 26.0 - 34.0 pg   MCHC 32.2 30.0 - 36.0 g/dL   RDW 13.9 11.5 - 15.5 %   Platelets 313 150 - 400 K/uL   nRBC 0.0 0.0 - 0.2 %  Basic metabolic panel  Result Value Ref Range   Sodium 137 135 - 145 mmol/L   Potassium 4.1 3.5 - 5.1 mmol/L   Chloride 104 98 - 111 mmol/L   CO2 24 22 - 32 mmol/L   Glucose, Bld 103 (H) 70 - 99 mg/dL   BUN 28 (H) 8 - 23 mg/dL   Creatinine, Ser 1.43 (H) 0.44 - 1.00 mg/dL   Calcium 8.3 (L) 8.9 - 10.3 mg/dL   GFR, Estimated 36 (L) >60 mL/min   Anion gap 9 5 - 15  I-stat chem 8, ED  Result Value Ref Range   Sodium 139 135 - 145 mmol/L   Potassium 4.3 3.5 - 5.1 mmol/L   Chloride 104 98 - 111 mmol/L   BUN 38 (H) 8 - 23 mg/dL   Creatinine, Ser 1.90 (H) 0.44 - 1.00 mg/dL   Glucose, Bld 190 (H) 70 - 99 mg/dL   Calcium, Ion 1.18 1.15 - 1.40 mmol/L   TCO2 24 22 - 32 mmol/L   Hemoglobin 12.2 12.0 - 15.0 g/dL   HCT 36.0 36.0 - 46.0 %    This visit occurred  during the SARS-CoV-2 public health emergency.  Safety protocols were in place, including screening questions prior to the visit, additional usage of staff PPE, and extensive cleaning of exam room while observing appropriate contact time as indicated for disinfecting solutions.   COVID 19 screen:  No recent travel or known exposure to COVID19 The patient denies respiratory symptoms of COVID 19 at this time. The importance of social distancing was discussed today.   Assessment and Plan    Problem List Items Addressed This Visit     Frequent falls    Reviewed medication , will limit sedating medications.       Pelvic fracture (HCC)    Pelvic fracture followed by Ortho..minimal pain... not requiring tramadol. occ using tylenol.      Recurrent UTI    Currently not symptomatic.. no treatment needed.  Limit antibiotics as much as able given Cdiff history.      Situational anxiety    Can use hydroxyzine low dose for panic attack, anxiety spell.... if oversedating stop.      Subdural hematoma (HCC) - Primary    Scheduled for follow up with neurosurgery for repeat head CT in 1 month.  No worsening of symptoms at this time        Eliezer Lofts, MD

## 2021-01-26 NOTE — Patient Instructions (Addendum)
Can use hydroxyzine low dose for panic attack, anxiety spell.  Start PT as planned.  Call if any UTI symptoms.  Follow up with neurosurgeon as planned.  Start on regular routine.

## 2021-01-29 ENCOUNTER — Other Ambulatory Visit: Payer: Self-pay

## 2021-01-29 ENCOUNTER — Emergency Department: Payer: Medicare PPO

## 2021-01-29 ENCOUNTER — Emergency Department
Admission: EM | Admit: 2021-01-29 | Discharge: 2021-01-29 | Disposition: A | Discharge status: Home / Self Care | Payer: Medicare PPO | Attending: Emergency Medicine | Admitting: Emergency Medicine

## 2021-01-29 DIAGNOSIS — A0472 Enterocolitis due to Clostridium difficile, not specified as recurrent: Secondary | ICD-10-CM

## 2021-01-29 DIAGNOSIS — I130 Hypertensive heart and chronic kidney disease with heart failure and stage 1 through stage 4 chronic kidney disease, or unspecified chronic kidney disease: Secondary | ICD-10-CM | POA: Insufficient documentation

## 2021-01-29 DIAGNOSIS — E1122 Type 2 diabetes mellitus with diabetic chronic kidney disease: Secondary | ICD-10-CM | POA: Diagnosis not present

## 2021-01-29 DIAGNOSIS — F0390 Unspecified dementia without behavioral disturbance: Secondary | ICD-10-CM | POA: Insufficient documentation

## 2021-01-29 DIAGNOSIS — J449 Chronic obstructive pulmonary disease, unspecified: Secondary | ICD-10-CM | POA: Diagnosis not present

## 2021-01-29 DIAGNOSIS — R109 Unspecified abdominal pain: Secondary | ICD-10-CM | POA: Diagnosis not present

## 2021-01-29 DIAGNOSIS — K529 Noninfective gastroenteritis and colitis, unspecified: Secondary | ICD-10-CM | POA: Diagnosis not present

## 2021-01-29 DIAGNOSIS — N184 Chronic kidney disease, stage 4 (severe): Secondary | ICD-10-CM | POA: Diagnosis not present

## 2021-01-29 DIAGNOSIS — I5032 Chronic diastolic (congestive) heart failure: Secondary | ICD-10-CM | POA: Diagnosis not present

## 2021-01-29 DIAGNOSIS — R197 Diarrhea, unspecified: Secondary | ICD-10-CM | POA: Diagnosis not present

## 2021-01-29 DIAGNOSIS — K5730 Diverticulosis of large intestine without perforation or abscess without bleeding: Secondary | ICD-10-CM | POA: Diagnosis not present

## 2021-01-29 DIAGNOSIS — Z7951 Long term (current) use of inhaled steroids: Secondary | ICD-10-CM | POA: Insufficient documentation

## 2021-01-29 DIAGNOSIS — K429 Umbilical hernia without obstruction or gangrene: Secondary | ICD-10-CM | POA: Diagnosis not present

## 2021-01-29 DIAGNOSIS — I10 Essential (primary) hypertension: Secondary | ICD-10-CM | POA: Diagnosis not present

## 2021-01-29 DIAGNOSIS — Z87891 Personal history of nicotine dependence: Secondary | ICD-10-CM | POA: Insufficient documentation

## 2021-01-29 DIAGNOSIS — Z96643 Presence of artificial hip joint, bilateral: Secondary | ICD-10-CM | POA: Insufficient documentation

## 2021-01-29 DIAGNOSIS — R6889 Other general symptoms and signs: Secondary | ICD-10-CM | POA: Diagnosis not present

## 2021-01-29 DIAGNOSIS — E1165 Type 2 diabetes mellitus with hyperglycemia: Secondary | ICD-10-CM | POA: Diagnosis not present

## 2021-01-29 DIAGNOSIS — Z20822 Contact with and (suspected) exposure to covid-19: Secondary | ICD-10-CM | POA: Diagnosis not present

## 2021-01-29 DIAGNOSIS — Z743 Need for continuous supervision: Secondary | ICD-10-CM | POA: Diagnosis not present

## 2021-01-29 DIAGNOSIS — K449 Diaphragmatic hernia without obstruction or gangrene: Secondary | ICD-10-CM | POA: Diagnosis not present

## 2021-01-29 LAB — CBC WITH DIFFERENTIAL/PLATELET
Abs Immature Granulocytes: 1.84 10*3/uL — ABNORMAL HIGH (ref 0.00–0.07)
Basophils Absolute: 0 10*3/uL (ref 0.0–0.1)
Basophils Relative: 0 %
Eosinophils Absolute: 0 10*3/uL (ref 0.0–0.5)
Eosinophils Relative: 0 %
HCT: 31.4 % — ABNORMAL LOW (ref 36.0–46.0)
Hemoglobin: 10.1 g/dL — ABNORMAL LOW (ref 12.0–15.0)
Immature Granulocytes: 6 %
Lymphocytes Relative: 10 %
Lymphs Abs: 3.1 10*3/uL (ref 0.7–4.0)
MCH: 30 pg (ref 26.0–34.0)
MCHC: 32.2 g/dL (ref 30.0–36.0)
MCV: 93.2 fL (ref 80.0–100.0)
Monocytes Absolute: 2.3 10*3/uL — ABNORMAL HIGH (ref 0.1–1.0)
Monocytes Relative: 8 %
Neutro Abs: 22.7 10*3/uL — ABNORMAL HIGH (ref 1.7–7.7)
Neutrophils Relative %: 76 %
Platelets: 366 10*3/uL (ref 150–400)
RBC: 3.37 MIL/uL — ABNORMAL LOW (ref 3.87–5.11)
RDW: 13.9 % (ref 11.5–15.5)
Smear Review: NORMAL
WBC: 29.9 10*3/uL — ABNORMAL HIGH (ref 4.0–10.5)
nRBC: 0 % (ref 0.0–0.2)

## 2021-01-29 LAB — GASTROINTESTINAL PANEL BY PCR, STOOL (REPLACES STOOL CULTURE)

## 2021-01-29 LAB — URINALYSIS, COMPLETE (UACMP) WITH MICROSCOPIC
Bacteria, UA: NONE SEEN
Bilirubin Urine: NEGATIVE
Glucose, UA: NEGATIVE mg/dL
Hgb urine dipstick: NEGATIVE
Ketones, ur: NEGATIVE mg/dL
Leukocytes,Ua: NEGATIVE
Nitrite: NEGATIVE
Protein, ur: NEGATIVE mg/dL
Specific Gravity, Urine: 1.014 (ref 1.005–1.030)
Squamous Epithelial / HPF: NONE SEEN (ref 0–5)
pH: 5 (ref 5.0–8.0)

## 2021-01-29 LAB — COMPREHENSIVE METABOLIC PANEL
ALT: 8 U/L (ref 0–44)
AST: 22 U/L (ref 15–41)
Albumin: 3.1 g/dL — ABNORMAL LOW (ref 3.5–5.0)
Alkaline Phosphatase: 109 U/L (ref 38–126)
Anion gap: 9 (ref 5–15)
BUN: 24 mg/dL — ABNORMAL HIGH (ref 8–23)
CO2: 20 mmol/L — ABNORMAL LOW (ref 22–32)
Calcium: 8.4 mg/dL — ABNORMAL LOW (ref 8.9–10.3)
Chloride: 105 mmol/L (ref 98–111)
Creatinine, Ser: 1.82 mg/dL — ABNORMAL HIGH (ref 0.44–1.00)
GFR, Estimated: 27 mL/min — ABNORMAL LOW (ref >60)
Glucose, Bld: 192 mg/dL — ABNORMAL HIGH (ref 70–99)
Potassium: 3.7 mmol/L (ref 3.5–5.1)
Sodium: 134 mmol/L — ABNORMAL LOW (ref 135–145)
Total Bilirubin: 0.9 mg/dL (ref 0.3–1.2)
Total Protein: 6.1 g/dL — ABNORMAL LOW (ref 6.5–8.1)

## 2021-01-29 LAB — PROTIME-INR
INR: 1.2 (ref 0.8–1.2)
Prothrombin Time: 15 seconds (ref 11.4–15.2)

## 2021-01-29 LAB — RESP PANEL BY RT-PCR (FLU A&B, COVID) ARPGX2
Influenza A by PCR: NEGATIVE
Influenza B by PCR: NEGATIVE
SARS Coronavirus 2 by RT PCR: NEGATIVE

## 2021-01-29 LAB — C DIFFICILE QUICK SCREEN W PCR REFLEX
C Diff antigen: NEGATIVE
C Diff toxin: POSITIVE — AB

## 2021-01-29 LAB — APTT: aPTT: 26 seconds (ref 24–36)

## 2021-01-29 LAB — LIPASE, BLOOD: Lipase: 20 U/L (ref 11–51)

## 2021-01-29 LAB — LACTIC ACID, PLASMA: Lactic Acid, Venous: 1.2 mmol/L (ref 0.5–1.9)

## 2021-01-29 LAB — CLOSTRIDIUM DIFFICILE BY PCR, REFLEXED: Toxigenic C. Difficile by PCR: POSITIVE — AB

## 2021-01-29 MED ORDER — DIFICID 200 MG PO TABS: 200.0000 mg | ORAL_TABLET | Freq: Two times a day (BID) | ORAL | 0 refills | Status: AC

## 2021-01-29 MED ORDER — LACTATED RINGERS IV BOLUS
1000.0000 mL | Freq: Once | INTRAVENOUS | Status: AC
  Administered 2021-01-29: 1000 mL via INTRAVENOUS

## 2021-01-29 NOTE — ED Provider Notes (Signed)
[facility] Emergency Department Provider Note  ____________________________________________   Event Date/Time   First MD Initiated Contact with Patient 01/29/21 0930     (approximate)  I have reviewed the triage vital signs and the nursing notes.   HISTORY  Chief Complaint Diarrhea   HPI Jill [name] is a 87 y.o. female with past medical history significant for hypertension, diabetes, mild dementia and recent admission 5/31-6/2 for evaluation of nontraumatic subdural hematoma and UTI who presents via EMS from home with concerns from granddaughter that she has had some worsening diarrhea and abdominal pain.  Patient denies any pain including any abdominal pain, back pain, chest pain, cough, shortness of breath, rash or recent falls or injuries.  She states she thinks he may have a little diarrhea this morning but is not 100% sure and does not have any urinary symptoms.  Per granddaughter she has difficulty with her memory from her dementia and after recent subdural and that her daughter is worried she has recurrence of her CT patient is patient had some very mucousy diarrhea today.  She notes patient was treated with Rocephin and discharged on Keflex for UTI during recent hospitalization she is worried this may have caused a recurrence of the C. difficile.  Her granddaughter patient also was complaining of little abdominal pain earlier this morning and seemed weaker than usual.  She has not had significant diarrhea for today of the last couple days or any other associated sick symptoms.         Past Medical History:  Diagnosis Date  . Allergy   . Arthritis    osteoarthritis   . Chronic kidney disease   . Complication of anesthesia    difficult waking   . Depression   . Diabetes mellitus   . Diverticula, colon   . Family history of anesthesia complication    Son is difficult intubation  . Hyperlipidemia   . Hypertension   . Vasovagal syncope 2006    Negative cardiac workup-myoview, echo    Patient Active Problem List   Diagnosis Date Noted  . Subdural hematoma ([place]) 01/19/2021  . C. difficile diarrhea 11/14/2020  . Acute pain of left knee 09/29/2020  . Mild dementia ([place]) 07/03/2020  . Acute pain of right shoulder 07/03/2020  . Left foot pain 04/09/2020  . Acute infectious diarrhea 05/21/2019  . Bilateral leg weakness 05/21/2019  . Pulmonary nodule 05/29/2018  . Urinary urgency 02/03/2017  . Bilateral buttock pain 02/02/2017  . Gout involving toe of right foot 12/23/2016  . [place] intertrigo 10/28/2016  . Stage 2 moderate COPD by GOLD classification ([place]) 09/13/2016  . Type 2 diabetes mellitus with stage 3 chronic kidney disease, without long-term current use of insulin ([place]) 08/08/2016  . Chronic diastolic congestive heart failure ([place]) 08/08/2016  . Intracranial bleed ([place])   . BPPV (benign paroxysmal positional vertigo) 06/24/2016  . Memory loss 06/24/2016  . Accidental fall 06/24/2016  . Chronic neck pain 03/18/2016  . Neuropathy due to type 2 diabetes mellitus ([place]) 03/18/2016  . Anemia in chronic kidney disease 12/15/2015  . Incomplete bladder emptying 10/15/2015  . Abscess, renal/perirenal 10/15/2015  . Recurrent UTI 10/15/2015  . Lumbar back pain with radiculopathy affecting left lower extremity 05/05/2015  . Lesion of left native kidney 01/28/2015  . Chronic fatigue 01/22/2015  . Chronic idiopathic urticaria 01/17/2015  . S/P hip replacement 05/16/2014  . DNR (do not resuscitate) 02/20/2014  . ALLERGIC RHINITIS 12/17/2008  . Vitamin D deficiency  07/01/2008  . CKD (chronic kidney disease) stage 4, GFR 15-29 ml/min (HCC) 02/11/2008  . DIVERTICULOSIS OF COLON 02/01/2008  . COLONIC POLYPS, ADENOMATOUS 10/19/2006  . Type 2 diabetes mellitus with diabetic neuropathy, unspecified ([place]) 10/19/2006  . Hyperlipidemia 10/19/2006  . OBESITY, NOS 10/19/2006  . Major depression, recurrent ([place]) 10/19/2006  .  Hypertension associated with diabetes ([place]) 10/19/2006  . Recurrent urticaria 10/19/2006    Past Surgical History:  Procedure Laterality Date  . ABDOMINAL HYSTERECTOMY  1977   fibroid  . JOINT REPLACEMENT  2009   rt hip  . TOTAL HIP ARTHROPLASTY Left 05/12/2014   dr Mayer Camel  . TOTAL HIP ARTHROPLASTY Left 05/12/2014   Procedure: TOTAL HIP ARTHROPLASTY;  Surgeon: Kerin Salen, MD;  Location: [facility];  Service: Orthopedics;  Laterality: Left;    Prior to Admission medications   Medication Sig Start Date End Date Taking? Authorizing Provider  acetaminophen (TYLENOL) 500 MG tablet Take 1,000 mg by mouth every 6 (six) hours as needed for moderate pain or headache.    [provider]  amLODipine (NORVASC) 10 MG tablet Take 1 tablet (10 mg total) by mouth daily. Skip dose if SBP <130 mmHg 11/22/20 12/22/20  Val Riles, MD  cetirizine (ZYRTEC) 5 MG tablet Take 1 tablet (5 mg total) by mouth daily. 09/12/20   Jaynee Eagles, PA-C  diclofenac Sodium (VOLTAREN) 1 % GEL Apply 2 g topically 4 (four) times daily as needed.    [provider]  donepezil (ARICEPT) 5 MG tablet TAKE 1 TABLET BY MOUTH EVERYDAY AT BEDTIME 01/25/21   Bedsole, Amy E, MD  escitalopram (LEXAPRO) 20 MG tablet TAKE 1 TABLET BY MOUTH EVERY DAY 10/23/20   Bedsole, Amy E, MD  hydrOXYzine (ATARAX/VISTARIL) 10 MG tablet Take 0.5-1 tablets (5-10 mg total) by mouth daily as needed for anxiety. 01/26/21   Bedsole, Amy E, MD  midodrine (PROAMATINE) 5 MG tablet Take 1 tablet (5 mg total) by mouth daily as needed (if SBP <100 and stop Amlodipine). 11/21/20   Val Riles, MD  Multiple Vitamins-Minerals (MULTIVITAMIN WITH MINERALS) tablet Take 1 tablet by mouth daily.    [provider]  TART CHERRY PO Take 1 tablet by mouth in the morning and at bedtime.    [provider]  tiotropium (SPIRIVA HANDIHALER) 18 MCG inhalation capsule INHALE 1 CAPSULE VIA HANDIHALER ONCE DAILY AT THE SAME TIME EVERY DAY 09/16/20   Bedsole, Amy  E, MD  traMADol (ULTRAM) 50 MG tablet Take 50 mg by mouth every 8 (eight) hours as needed for moderate pain. 01/10/21   [provider]  Vitamin D, Ergocalciferol, (DRISDOL) 1.25 MG (50000 UNIT) CAPS capsule TAKE 1 CAPSULE (50,000 UNITS TOTAL) BY MOUTH EVERY FRIDAY. 10/16/20   Jinny Sanders, MD    Allergies Lovastatin, Statins, Sulfa antibiotics, Cephalosporins, Codeine, and Niacin  Family History  Problem Relation Age of Onset  . COPD Brother   . Heart disease Brother   . Diabetes Brother   . Lymphoma Brother   . Alzheimer's disease Brother   . Pancreatic cancer Sister   . Alzheimer's disease Sister   . Heart disease Mother   . Breast cancer Sister   . Leukemia Other   . Kidney disease Neg Hx     Social History Social History   Tobacco Use  . Smoking status: Former    Packs/day: 1.00    Years: 45.00    Pack years: 45.00    Types: Cigarettes    Quit date: 06/16/1999  Years since quitting: 21.6  . Smokeless tobacco: Never  . Tobacco comments:    quit 15 years ago  Substance Use Topics  . Alcohol use: No    Alcohol/week: 0.0 standard drinks  . Drug use: No    Review of Systems  Review of Systems  Unable to perform ROS: Dementia     ____________________________________________   PHYSICAL EXAM:  VITAL SIGNS: ED Triage Vitals [01/29/21 0925]  Enc Vitals Group     BP      Pulse Rate 94     Resp 18     Temp      Temp src      SpO2 96 %     Weight 156 lb 4 oz (70.9 kg)     Height 5\' 2"  (1.575 m)     Head Circumference      Peak Flow      Pain Score 0     Pain Loc      Pain Edu?      Excl. in [place]?    Vitals:   01/29/21 1436 01/29/21 1500  BP: 115/68 (!) 136/53  Pulse: 80 88  Resp: 16 (!) 23  Temp:    SpO2: 99% 97%   Physical Exam Vitals and nursing note reviewed.  Constitutional:      General: She is not in acute distress.    Appearance: She is well-developed.  HENT:     Head: Normocephalic and atraumatic.     Right Ear: External  ear normal.     Left Ear: External ear normal.  Eyes:     Conjunctiva/sclera: Conjunctivae normal.  Cardiovascular:     Rate and Rhythm: Normal rate and regular rhythm.     Heart sounds: No murmur heard. Pulmonary:     Effort: Pulmonary effort is normal. No respiratory distress.     Breath sounds: Normal breath sounds.  Abdominal:     Palpations: Abdomen is soft.     Tenderness: There is no abdominal tenderness.  Musculoskeletal:     Cervical back: Neck supple.     Right lower leg: No edema.     Left lower leg: No edema.  Skin:    General: Skin is warm and dry.     Capillary Refill: Capillary refill takes less than 2 seconds.  Neurological:     General: No focal deficit present.     Mental Status: She is alert. Mental status is at baseline. She is disoriented.     ____________________________________________   LABS (all labs ordered are listed, but only abnormal results are displayed)  Labs Reviewed  C DIFFICILE QUICK SCREEN W PCR REFLEX   - Abnormal; Notable for the following components:      Result Value   C Diff toxin POSITIVE (*)    All other components within normal limits  CBC WITH DIFFERENTIAL/PLATELET - Abnormal; Notable for the following components:   WBC 29.9 (*)    RBC 3.37 (*)    Hemoglobin 10.1 (*)    HCT 31.4 (*)    Neutro Abs 22.7 (*)    Monocytes Absolute 2.3 (*)    Abs Immature Granulocytes 1.84 (*)    All other components within normal limits  COMPREHENSIVE METABOLIC PANEL - Abnormal; Notable for the following components:   Sodium 134 (*)    CO2 20 (*)    Glucose, Bld 192 (*)    BUN 24 (*)    Creatinine, Ser 1.82 (*)    Calcium 8.4 (*)  Total Protein 6.1 (*)    Albumin 3.1 (*)    GFR, Estimated 27 (*)    All other components within normal limits  URINALYSIS, COMPLETE (UACMP) WITH MICROSCOPIC - Abnormal; Notable for the following components:   Color, Urine YELLOW (*)    APPearance CLEAR (*)    All other components within normal limits   RESP PANEL BY RT-PCR (FLU A&B, COVID) ARPGX2  CULTURE, BLOOD (SINGLE)  URINE CULTURE  GASTROINTESTINAL PANEL BY PCR, STOOL (REPLACES STOOL CULTURE)  CLOSTRIDIUM DIFFICILE BY PCR, REFLEXED  LIPASE, BLOOD  LACTIC ACID, PLASMA  PROTIME-INR  APTT   ____________________________________________  EKG  Sinus rhythm with ventricular of 93, normal axis, unremarkable intervals without clearance of acute ischemia with nonspecific change in lead II. ____________________________________________  RADIOLOGY  ED MD interpretation: CT abdomen pelvis remarkable for evidence of colitis from the cecum to the mid transverse colon and distal sigmoid colon.  There is also evidence of diverticulosis without evidence of diverticulitis.  Small hiatal hernia and small umbilical hernia containing fat.  Cortical atrophy of the kidneys bilaterally and very small pericardial effusion as well as aortic atherosclerosis.  Official radiology report(s): CT ABDOMEN PELVIS WO CONTRAST  Result Date: 01/29/2021 CLINICAL DATA:  Abdominal pain, recent hospitalization for subdural hematoma complicated by C difficile colitis EXAM: CT ABDOMEN AND PELVIS WITHOUT CONTRAST TECHNIQUE: Multidetector CT imaging of the abdomen and pelvis was performed following the standard protocol without IV contrast. Sagittal and coronal MPR images reconstructed from axial data set. No oral contrast administered. COMPARISON:  11/14/2020 FINDINGS: Lower chest: Subsegmental atelectasis at lung bases. Minimal pericardial effusion. Hepatobiliary: Gallbladder and liver normal appearance Pancreas: Normal appearance Spleen: Normal appearance Adrenals/Urinary Tract: Adrenal glands normal appearance. BILATERAL renal cortical atrophy. Small BILATERAL renal cysts. Several small hyperdense lesions within RIGHT kidney question hyperdense cysts, unchanged. Stomach/Bowel: Wall thickening of the colon from the cecum through mid transverse colon consistent with colitis.  Diverticulosis of descending and sigmoid colon with additional diffuse wall thickening. Minimal pericolic edema at the thickened segments. No definite wall thickening at the splenic flexure region. Small hiatal hernia. Stomach and small bowel loops otherwise normal appearance. Upper normal appendices size without inflammatory changes. Vascular/Lymphatic: Atherosclerotic calcifications aorta and iliac arteries without aneurysm. Few scattered normal size mesenteric lymph nodes. Reproductive: Uterus surgically absent.  Ovaries unremarkable. Other: No free air or free fluid. Small umbilical hernia containing fat. Musculoskeletal: BILATERAL hip prostheses. Osseous demineralization. Degenerative disc and facet disease changes lumbar spine. IMPRESSION: Wall thickening of the colon from the cecum through mid transverse colon as well as sigmoid colon consistent with colitis; differential diagnosis includes infection and inflammatory bowel disease, potentially C difficile in light of recent history. Distal colonic diverticulosis without evidence of diverticulitis. Small hiatal hernia. Small umbilical hernia containing fat. BILATERAL renal cortical atrophy with small BILATERAL renal cysts and probable hyperdense RIGHT renal cysts. Minimal pericardial effusion. Aortic Atherosclerosis (ICD10-I70.0). Electronically Signed   By: Lavonia Dana M.D.   On: 01/29/2021 11:55    ____________________________________________   PROCEDURES  Procedure(s) performed (including Critical Care):  .1-3 Lead EKG Interpretation  Date/Time: 01/29/2021 4:06 PM Performed by: Lucrezia Starch, MD Authorized by: Lucrezia Starch, MD     Interpretation: normal     ECG rate assessment: normal     Rhythm: sinus rhythm     Ectopy: none     Conduction: normal     ____________________________________________   INITIAL IMPRESSION / ASSESSMENT AND PLAN / ED COURSE     Patient  presents with above-stated history exam for assessment of  reported abdominal pain earlier today associate with some new diarrhea in the setting of being on Keflex for recently diagnosed UTI.  Patient is at her neurological baseline per daughter bedside although weaker today as well.  Patient denies any complaints although does not contribute further history.  On arrival she is afebrile and hemodynamically stable.  Abdomen is soft nontender.  Differential includes possible recurrence of C. difficile, diverticulitis, metabolic derangements, appendicitis, cholecystitis,   CT abdomen pelvis remarkable for evidence of colitis from the cecum to the mid transverse colon and distal sigmoid colon.  There is also evidence of diverticulosis without evidence of diverticulitis.  Small hiatal hernia and small umbilical hernia containing fat.  Cortical atrophy of the kidneys bilaterally and very small pericardial effusion as well as aortic atherosclerosis.  CBC with WBC count of 29.9, hemoglobin at baseline and normal platelets.  CMP with kidney function of 1.82 which seems to be close to baseline without any other significant electrode or metabolic derangements.  Lipase not consistent with acute pancreatitis.  Lactic acid nonelevated 1.2.  Urine does not appear infected.  Overall I do not believe patient is septic and is likely safe for discharge home.  She is pending GI pathogen and C. difficile studies at the time of signout.  This will determine if she will go home with Doxy mycin versus antibiotics for colitis.  Discussed this plan with patient and granddaughter at bedside.  They are amenable to this.  She is able to tolerate p.o. and does not appear significantly dehydrated.  Advise close outpatient PCP follow-up.     ____________________________________________   FINAL CLINICAL IMPRESSION(S) / ED DIAGNOSES  Final diagnoses:  Colitis    Medications  lactated ringers bolus 1,000 mL (0 mLs Intravenous Stopped 01/29/21 1356)     ED Discharge Orders     None         Note:  This document was prepared using Dragon voice recognition software and may include unintentional dictation errors.    Lucrezia Starch, MD 01/29/21 [phone]

## 2021-01-29 NOTE — ED Notes (Signed)
Patient transported to CT 

## 2021-01-29 NOTE — ED Triage Notes (Addendum)
Pt to ER via [facility] from home with complaints of abdominal pain.   Pt was recently hospitalized for subdural hematoma in May, d/c 01/21/21. Pt was diagnosed with c.diff during hospitalization. Pt states she woke up this morning with lower abdominal pain (which she denies at this time) and diarrhea.   EMS VSS- bp 142/58, HR 100, O2 sats 94% RA, RR 16, temp 98.6 orally, cbg 265

## 2021-01-29 NOTE — Discharge Instructions (Addendum)
Take the antibiotic as prescribed and finish the full 10-day course.  Follow-up with your regular doctor.  Return to the ER for new, worsening, or persistent severe diarrhea, abdominal pain, vomiting or inability to take the medication, fever, weakness, or any other new or worsening symptoms that concern you.

## 2021-01-29 NOTE — ED Provider Notes (Signed)
-----------------------------------------   6:02 PM on 01/29/2021 -----------------------------------------  I took over care on this patient from Dr. Tamala Julian.  The C. difficile toxin is positive.  I will proceed with treatment for C. difficile.  On reassessment, the patient appears comfortable.  I discussed the results of the work-up with her.  Given her elevated WBC count, age, and CT findings of colitis, I offered her admission antibiotics and monitoring.  However, the patient states that she has a strong preference to go home.  Given that her vital signs are normal, she is not vomiting, is afebrile, has a normal lactate, and has an overall otherwise reassuring work-up, I think that this is appropriate.  I will prescribe a 10-day course of Dificid.  I gave the patient and her family member thorough return precautions and they expressed understanding.   Arta Silence, MD 01/29/21 [phone]

## 2021-01-30 LAB — URINE CULTURE: Culture: NO GROWTH

## 2021-01-31 DIAGNOSIS — E1165 Type 2 diabetes mellitus with hyperglycemia: Secondary | ICD-10-CM | POA: Diagnosis not present

## 2021-01-31 DIAGNOSIS — E1122 Type 2 diabetes mellitus with diabetic chronic kidney disease: Secondary | ICD-10-CM | POA: Diagnosis not present

## 2021-01-31 DIAGNOSIS — E1140 Type 2 diabetes mellitus with diabetic neuropathy, unspecified: Secondary | ICD-10-CM | POA: Diagnosis not present

## 2021-01-31 DIAGNOSIS — N184 Chronic kidney disease, stage 4 (severe): Secondary | ICD-10-CM | POA: Diagnosis not present

## 2021-01-31 DIAGNOSIS — I5032 Chronic diastolic (congestive) heart failure: Secondary | ICD-10-CM | POA: Diagnosis not present

## 2021-01-31 DIAGNOSIS — E1159 Type 2 diabetes mellitus with other circulatory complications: Secondary | ICD-10-CM | POA: Diagnosis not present

## 2021-01-31 DIAGNOSIS — F0390 Unspecified dementia without behavioral disturbance: Secondary | ICD-10-CM | POA: Diagnosis not present

## 2021-01-31 DIAGNOSIS — S065X0D Traumatic subdural hemorrhage without loss of consciousness, subsequent encounter: Secondary | ICD-10-CM | POA: Diagnosis not present

## 2021-01-31 DIAGNOSIS — I152 Hypertension secondary to endocrine disorders: Secondary | ICD-10-CM | POA: Diagnosis not present

## 2021-02-01 ENCOUNTER — Telehealth: Payer: Self-pay | Admitting: Family Medicine

## 2021-02-01 NOTE — Telephone Encounter (Signed)
New message    Need verbal order for home health PT.   One time a week for one weeks  Twice a week for four weeks   One time a week for four weeks

## 2021-02-01 NOTE — Telephone Encounter (Signed)
Verbal orders given to [facility] for:  Home Health PT   One time a week x one week  Twice a week x four weeks  One time a week x four weeks

## 2021-02-03 ENCOUNTER — Encounter: Payer: Self-pay | Admitting: Family Medicine

## 2021-02-03 LAB — CULTURE, BLOOD (SINGLE)
Culture: NO GROWTH
Special Requests: ADEQUATE

## 2021-02-04 ENCOUNTER — Encounter: Payer: Self-pay | Admitting: Family Medicine

## 2021-02-04 ENCOUNTER — Other Ambulatory Visit: Payer: Self-pay

## 2021-02-04 ENCOUNTER — Ambulatory Visit: Payer: Medicare PPO | Admitting: Family Medicine

## 2021-02-04 VITALS — BP 112/68 | HR 93 | Temp 97.6°F | Ht 62.0 in | Wt 155.0 lb

## 2021-02-04 DIAGNOSIS — A0472 Enterocolitis due to Clostridium difficile, not specified as recurrent: Secondary | ICD-10-CM | POA: Diagnosis not present

## 2021-02-04 LAB — BASIC METABOLIC PANEL
BUN: 25 mg/dL — ABNORMAL HIGH (ref 6–23)
CO2: 26 mEq/L (ref 19–32)
Calcium: 8.5 mg/dL (ref 8.4–10.5)
Chloride: 104 mEq/L (ref 96–112)
Creatinine, Ser: 1.75 mg/dL — ABNORMAL HIGH (ref 0.40–1.20)
GFR: 25.97 mL/min — ABNORMAL LOW (ref >60.00)
Glucose, Bld: 234 mg/dL — ABNORMAL HIGH (ref 70–99)
Potassium: 5 mEq/L (ref 3.5–5.1)
Sodium: 137 mEq/L (ref 135–145)

## 2021-02-04 LAB — CBC WITH DIFFERENTIAL/PLATELET
Basophils Absolute: 0 10*3/uL (ref 0.0–0.1)
Basophils Relative: 0.3 % (ref 0.0–3.0)
Eosinophils Absolute: 0.1 10*3/uL (ref 0.0–0.7)
Eosinophils Relative: 0.7 % (ref 0.0–5.0)
HCT: 30.8 % — ABNORMAL LOW (ref 36.0–46.0)
Hemoglobin: 10.3 g/dL — ABNORMAL LOW (ref 12.0–15.0)
Lymphocytes Relative: 29.8 % (ref 12.0–46.0)
Lymphs Abs: 2.7 10*3/uL (ref 0.7–4.0)
MCHC: 33.3 g/dL (ref 30.0–36.0)
MCV: 90.9 fl (ref 78.0–100.0)
Monocytes Absolute: 1.3 10*3/uL — ABNORMAL HIGH (ref 0.1–1.0)
Monocytes Relative: 13.7 % — ABNORMAL HIGH (ref 3.0–12.0)
Neutro Abs: 5.1 10*3/uL (ref 1.4–7.7)
Neutrophils Relative %: 55.5 % (ref 43.0–77.0)
Platelets: 332 10*3/uL (ref 150.0–400.0)
RBC: 3.39 Mil/uL — ABNORMAL LOW (ref 3.87–5.11)
RDW: 15 % (ref 11.5–15.5)
WBC: 9.2 10*3/uL (ref 4.0–10.5)

## 2021-02-04 NOTE — Progress Notes (Signed)
This visit occurred during the SARS-CoV-2 public health emergency.  Safety protocols were in place, including screening questions prior to the visit, additional usage of staff PPE, and extensive cleaning of exam room while observing appropriate contact time as indicated for disinfecting solutions.  ER f/u.  C diff positive.  Less diarrhea and she feels some better but not back to baseline.  Still mucous in stools now.  No fevers.  Some occ abd pain but that isn't escalating or present now.  On dificid currently.  She has CKD at baseline.  Prev episode of C diff. as in 11/2020.  Overall improvement noted in the last 24 hours, she clearly feels better today.  Recent labs and emergency room evaluation discussed with patient, especially her elevated white count but normal lactate.  Meds, vitals, and allergies reviewed.   ROS: Per HPI unless specifically indicated in ROS section   GEN: nad, alert and oriented HEENT: mucous membranes moist NECK: supple w/o LA CV: rrr PULM: ctab, no inc wob ABD: soft, +bs, not ttp EXT: no edema SKIN: Well-perfused.  32 minutes were devoted to patient care in this encounter (this includes time spent reviewing the patient's file/history, interviewing and examining the patient, counseling/reviewing plan with patient).

## 2021-02-04 NOTE — Patient Instructions (Signed)
Go to the lab on the way out.   If you have mychart we'll likely use that to update you.    Take care.  Glad to see you. I"ll update Dr. Diona Browner.

## 2021-02-05 DIAGNOSIS — I5032 Chronic diastolic (congestive) heart failure: Secondary | ICD-10-CM | POA: Diagnosis not present

## 2021-02-05 DIAGNOSIS — N184 Chronic kidney disease, stage 4 (severe): Secondary | ICD-10-CM | POA: Diagnosis not present

## 2021-02-05 DIAGNOSIS — E1122 Type 2 diabetes mellitus with diabetic chronic kidney disease: Secondary | ICD-10-CM | POA: Diagnosis not present

## 2021-02-05 DIAGNOSIS — I152 Hypertension secondary to endocrine disorders: Secondary | ICD-10-CM | POA: Diagnosis not present

## 2021-02-05 DIAGNOSIS — E1159 Type 2 diabetes mellitus with other circulatory complications: Secondary | ICD-10-CM | POA: Diagnosis not present

## 2021-02-05 DIAGNOSIS — S065X0D Traumatic subdural hemorrhage without loss of consciousness, subsequent encounter: Secondary | ICD-10-CM | POA: Diagnosis not present

## 2021-02-05 DIAGNOSIS — E1165 Type 2 diabetes mellitus with hyperglycemia: Secondary | ICD-10-CM | POA: Diagnosis not present

## 2021-02-05 DIAGNOSIS — F0390 Unspecified dementia without behavioral disturbance: Secondary | ICD-10-CM | POA: Diagnosis not present

## 2021-02-05 DIAGNOSIS — E1140 Type 2 diabetes mellitus with diabetic neuropathy, unspecified: Secondary | ICD-10-CM | POA: Diagnosis not present

## 2021-02-07 NOTE — Assessment & Plan Note (Signed)
She is clinically improved today, fortunately.  Still okay for outpatient follow-up.  Reasonable to recheck her labs today, especially regarding her white count.  I will ask for input from Dr. Diona Browner about potential vancomycin taper/pulse after completing current antibiotics.  I will defer to PCP.  Routine cautions given to patient in the meantime regarding limiting spread/exposure to others, medication use, etc.  Discussed potential pulse dose vancomycin in the future, along with rationale.

## 2021-02-08 DIAGNOSIS — E1122 Type 2 diabetes mellitus with diabetic chronic kidney disease: Secondary | ICD-10-CM | POA: Diagnosis not present

## 2021-02-08 DIAGNOSIS — E1140 Type 2 diabetes mellitus with diabetic neuropathy, unspecified: Secondary | ICD-10-CM | POA: Diagnosis not present

## 2021-02-08 DIAGNOSIS — E1165 Type 2 diabetes mellitus with hyperglycemia: Secondary | ICD-10-CM | POA: Diagnosis not present

## 2021-02-08 DIAGNOSIS — F0390 Unspecified dementia without behavioral disturbance: Secondary | ICD-10-CM | POA: Diagnosis not present

## 2021-02-08 DIAGNOSIS — I5032 Chronic diastolic (congestive) heart failure: Secondary | ICD-10-CM | POA: Diagnosis not present

## 2021-02-08 DIAGNOSIS — E1159 Type 2 diabetes mellitus with other circulatory complications: Secondary | ICD-10-CM | POA: Diagnosis not present

## 2021-02-08 DIAGNOSIS — I152 Hypertension secondary to endocrine disorders: Secondary | ICD-10-CM | POA: Diagnosis not present

## 2021-02-08 DIAGNOSIS — N184 Chronic kidney disease, stage 4 (severe): Secondary | ICD-10-CM | POA: Diagnosis not present

## 2021-02-08 DIAGNOSIS — S065X0D Traumatic subdural hemorrhage without loss of consciousness, subsequent encounter: Secondary | ICD-10-CM | POA: Diagnosis not present

## 2021-02-09 ENCOUNTER — Other Ambulatory Visit: Payer: Self-pay | Admitting: Neurological Surgery

## 2021-02-09 DIAGNOSIS — S065X9A Traumatic subdural hemorrhage with loss of consciousness of unspecified duration, initial encounter: Secondary | ICD-10-CM

## 2021-02-09 DIAGNOSIS — S065XAA Traumatic subdural hemorrhage with loss of consciousness status unknown, initial encounter: Secondary | ICD-10-CM

## 2021-02-10 ENCOUNTER — Other Ambulatory Visit: Payer: Self-pay | Admitting: Family Medicine

## 2021-02-10 DIAGNOSIS — I5032 Chronic diastolic (congestive) heart failure: Secondary | ICD-10-CM | POA: Diagnosis not present

## 2021-02-10 DIAGNOSIS — S065X0D Traumatic subdural hemorrhage without loss of consciousness, subsequent encounter: Secondary | ICD-10-CM | POA: Diagnosis not present

## 2021-02-10 DIAGNOSIS — E1159 Type 2 diabetes mellitus with other circulatory complications: Secondary | ICD-10-CM | POA: Diagnosis not present

## 2021-02-10 DIAGNOSIS — F0390 Unspecified dementia without behavioral disturbance: Secondary | ICD-10-CM | POA: Diagnosis not present

## 2021-02-10 DIAGNOSIS — N184 Chronic kidney disease, stage 4 (severe): Secondary | ICD-10-CM | POA: Diagnosis not present

## 2021-02-10 DIAGNOSIS — E1122 Type 2 diabetes mellitus with diabetic chronic kidney disease: Secondary | ICD-10-CM | POA: Diagnosis not present

## 2021-02-10 DIAGNOSIS — E1165 Type 2 diabetes mellitus with hyperglycemia: Secondary | ICD-10-CM | POA: Diagnosis not present

## 2021-02-10 DIAGNOSIS — I152 Hypertension secondary to endocrine disorders: Secondary | ICD-10-CM | POA: Diagnosis not present

## 2021-02-10 DIAGNOSIS — E1140 Type 2 diabetes mellitus with diabetic neuropathy, unspecified: Secondary | ICD-10-CM | POA: Diagnosis not present

## 2021-02-10 NOTE — Telephone Encounter (Signed)
Pharmacy is requesting 90 day supply.  Last refilled 01/26/2021 for #15 with no refills.

## 2021-02-15 DIAGNOSIS — S065X0D Traumatic subdural hemorrhage without loss of consciousness, subsequent encounter: Secondary | ICD-10-CM | POA: Diagnosis not present

## 2021-02-15 DIAGNOSIS — E1159 Type 2 diabetes mellitus with other circulatory complications: Secondary | ICD-10-CM | POA: Diagnosis not present

## 2021-02-15 DIAGNOSIS — I5032 Chronic diastolic (congestive) heart failure: Secondary | ICD-10-CM | POA: Diagnosis not present

## 2021-02-15 DIAGNOSIS — N184 Chronic kidney disease, stage 4 (severe): Secondary | ICD-10-CM | POA: Diagnosis not present

## 2021-02-15 DIAGNOSIS — E1122 Type 2 diabetes mellitus with diabetic chronic kidney disease: Secondary | ICD-10-CM | POA: Diagnosis not present

## 2021-02-15 DIAGNOSIS — I152 Hypertension secondary to endocrine disorders: Secondary | ICD-10-CM | POA: Diagnosis not present

## 2021-02-15 DIAGNOSIS — F0390 Unspecified dementia without behavioral disturbance: Secondary | ICD-10-CM | POA: Diagnosis not present

## 2021-02-15 DIAGNOSIS — E1165 Type 2 diabetes mellitus with hyperglycemia: Secondary | ICD-10-CM | POA: Diagnosis not present

## 2021-02-15 DIAGNOSIS — E1140 Type 2 diabetes mellitus with diabetic neuropathy, unspecified: Secondary | ICD-10-CM | POA: Diagnosis not present

## 2021-02-15 NOTE — Progress Notes (Signed)
02/16/2021 4:54 PM   Jill [name] 09-Jun-1932 [id number]  Referring provider: Jinny Sanders, MD [address] [place],  [place] [address]  Urological history: 1. Renal cysts -non-contrast CT 2022 - Small BILATERAL renal cysts. Several small hyperdense lesions within RIGHT kidney question hyperdense cysts, unchanged  2. Renal abscess -MRI 2016 - resolved  3. rUTI's -contributing factors of age, vaginal atrophy, DM, C. Diff and incontinence -documented positive urine cultures over the last year  E. coli resistant to Cipro and trimethoprim/sulfa on Jan 19, 2021  Pansensitive E. coli on November 09, 2020  E. coli resistant to ampicillin, gentamicin and trimethoprim sulfa on September 12, 2020  4. Vaginal atrophy/urethral caruncle -Vaginal estrogen cream has been misplaced and in need of new prescription  Chief Complaint  Patient presents with   Recurrent UTI    HPI: Jill [name] is a 87 y.o. female who presents today for rUTI's  CATH UA benign.     She states that her symptoms with an UTI consist of itching and burning when she urinates.  She also experiences LBP.  An increase in incontinence is also noted.    She is having an increase in frequency due to increase in fluids.  She sometimes feels like she has to go, but she is unable to urinate.    Patient denies any modifying or aggravating factors.  Patient denies any gross hematuria, dysuria or suprapubic/flank pain.  Patient denies any fevers, chills, nausea or vomiting.    She does not engage in tub baths.  She does not have constipation or diarrhea.  She is taking pro-biotics.   PMH: Past Medical History:  Diagnosis Date   Allergy    Arthritis    osteoarthritis    Chronic kidney disease    Complication of anesthesia    difficult waking    Depression    Diabetes mellitus    Diverticula, colon    Family history of anesthesia complication    Son is difficult intubation   Hyperlipidemia    Hypertension     Vasovagal syncope 2006   Negative cardiac workup-myoview, echo    Surgical History: Past Surgical History:  Procedure Laterality Date   ABDOMINAL HYSTERECTOMY  1977   fibroid   JOINT REPLACEMENT  2009   rt hip   TOTAL HIP ARTHROPLASTY Left 05/12/2014   dr Mayer Camel   TOTAL HIP ARTHROPLASTY Left 05/12/2014   Procedure: TOTAL HIP ARTHROPLASTY;  Surgeon: Kerin Salen, MD;  Location: [facility];  Service: Orthopedics;  Laterality: Left;    Home Medications:  Allergies as of 02/16/2021       Reactions   Lovastatin Other (See Comments)   REACTION: leg pain   Statins Other (See Comments)   REACTION: leg cramps, weakness   Sulfa Antibiotics Hives, Itching   Cephalosporins    Codeine Rash   Niacin Rash   Flushing also        Medication List        Accurate as of February 16, 2021 11:59 PM. If you have any questions, ask your nurse or doctor.          STOP taking these medications    amLODipine 10 MG tablet Commonly known as: NORVASC Stopped by: Amyri Frenz, PA-C   midodrine 5 MG tablet Commonly known as: PROAMATINE Stopped by: Boniface Goffe, PA-C       TAKE these medications    acetaminophen 500 MG tablet Commonly known as: TYLENOL Take 1,000  mg by mouth every 6 (six) hours as needed for moderate pain or headache.   cetirizine 5 MG tablet Commonly known as: ZYRTEC Take 1 tablet (5 mg total) by mouth daily.   diclofenac Sodium 1 % Gel Commonly known as: VOLTAREN Apply 2 g topically 4 (four) times daily as needed.   donepezil 5 MG tablet Commonly known as: ARICEPT TAKE 1 TABLET BY MOUTH EVERYDAY AT BEDTIME   escitalopram 20 MG tablet Commonly known as: LEXAPRO TAKE 1 TABLET BY MOUTH EVERY DAY   estradiol 0.1 MG/GM vaginal cream Commonly known as: ESTRACE VAGINAL Apply 0.49m (pea-sized amount)  just inside the vaginal introitus with a finger-tip on Monday, Wednesday and Friday nights. Started by: SZara Council PA-C   hydrOXYzine 10 MG  tablet Commonly known as: ATARAX/VISTARIL TAKE 0.5-1 TABLETS (5-10 MG TOTAL) BY MOUTH DAILY AS NEEDED FOR ANXIETY.   multivitamin with minerals tablet Take 1 tablet by mouth daily.   Spiriva HandiHaler 18 MCG inhalation capsule Generic drug: tiotropium INHALE 1 CAPSULE VIA HANDIHALER ONCE DAILY AT THE SAME TIME EVERY DAY   TART CHERRY PO Take 1 tablet by mouth in the morning and at bedtime.   traMADol 50 MG tablet Commonly known as: ULTRAM Take 50 mg by mouth every 8 (eight) hours as needed for moderate pain.   Vitamin D (Ergocalciferol) 1.25 MG (50000 UNIT) Caps capsule Commonly known as: DRISDOL TAKE 1 CAPSULE (50,000 UNITS TOTAL) BY MOUTH EVERY FRIDAY.        Allergies:  Allergies  Allergen Reactions   Lovastatin Other (See Comments)    REACTION: leg pain   Statins Other (See Comments)    REACTION: leg cramps, weakness   Sulfa Antibiotics Hives and Itching   Cephalosporins    Codeine Rash   Niacin Rash    Flushing also    Family History: Family History  Problem Relation Age of Onset   COPD Brother    Heart disease Brother    Diabetes Brother    Lymphoma Brother    Alzheimer's disease Brother    Pancreatic cancer Sister    Alzheimer's disease Sister    Heart disease Mother    Breast cancer Sister    Leukemia Other    Kidney disease Neg Hx     Social History:  reports that she quit smoking about 21 years ago. Her smoking use included cigarettes. She has a 45.00 pack-year smoking history. She has never used smokeless tobacco. She reports that she does not drink alcohol and does not use drugs.  ROS: Pertinent ROS in HPI  Physical Exam: BP 132/63   Pulse 69   Ht 5' 2"  (1.575 m)   Wt 152 lb (68.9 kg)   BMI 27.80 kg/m   Constitutional:  Well nourished. Alert and oriented, No acute distress. HEENT: [place] AT, mask in place  Trachea midline Cardiovascular: No clubbing, cyanosis, or edema. Respiratory: Normal respiratory effort, no increased work of  breathing. Neurologic: Grossly intact, no focal deficits, moving all 4 extremities. Psychiatric: Normal mood and affect.    Laboratory Data: Lab Results  Component Value Date   WBC 9.2 02/04/2021   HGB 10.3 (L) 02/04/2021   HCT 30.8 (L) 02/04/2021   MCV 90.9 02/04/2021   PLT 332.0 02/04/2021    Lab Results  Component Value Date   CREATININE 1.75 (H) 02/04/2021    Lab Results  Component Value Date   HGBA1C 7.2 (H) 11/14/2020    Lab Results  Component Value Date   AST  22 01/29/2021   Lab Results  Component Value Date   ALT 8 01/29/2021     Urinalysis Component     Latest Ref Rng & Units 02/16/2021  Specific Gravity, UA     1.005 - 1.030 1.020  pH, UA     5.0 - 7.5 5.5  Color, UA     Yellow Yellow  Appearance Ur     Clear Hazy (A)  Leukocytes,UA     Negative Negative  Protein,UA     Negative/Trace Negative  Glucose, UA     Negative Negative  Ketones, UA     Negative Negative  RBC, UA     Negative Negative  Bilirubin, UA     Negative Negative  Urobilinogen, Ur     0.2 - 1.0 mg/dL 0.2  Nitrite, UA     Negative Negative  Microscopic Examination      See below:   Component     Latest Ref Rng & Units 02/16/2021          WBC, UA     0 - 5 /hpf 0-5  RBC     0 - 2 /hpf 0-2  Epithelial Cells (non renal)     0 - 10 /hpf 0-10  Bacteria, UA     None seen/Few None seen  I have reviewed the labs.   Pertinent Imaging: Narrative & Impression  CLINICAL DATA:  Right history of: Diverticulitis suspected   Nausea, vomiting, diarrhea. Recently diagnosed with urinary tract infection.   EXAM: CT ABDOMEN AND PELVIS WITHOUT CONTRAST   TECHNIQUE: Multidetector CT imaging of the abdomen and pelvis was performed following the standard protocol without IV contrast.   COMPARISON:  Noncontrast CT 01/27/2015, renal mass protocol MRI 07/10/2015   FINDINGS: Lower chest: Small bilateral pulmonary nodules are stable from 2016 and considered benign. No acute  airspace disease. No pleural effusion. Trace pericardial effusion.   Hepatobiliary: No focal hepatic abnormality on noncontrast exam. No gallstones, pericholecystic fat stranding or biliary dilatation.   Pancreas: Mild parenchymal atrophy. No ductal dilatation or inflammation.   Spleen: Normal in size without focal abnormality.   Adrenals/Urinary Tract: Mild adrenal thickening without dominant adrenal nodule. Bilateral renal parenchymal thinning and areas of scarring. No hydronephrosis or renal calculi. No significant perinephric edema. Bilateral renal cysts as well as scattered high-density lesions that are incompletely characterized on this noncontrast exam. One of these lesions in the lower left kidney appears increased in size from prior exam measuring 19 mm, series 2, image 35, with some internal high density. Urinary bladder is grossly negative, partially obscured by motion and streak artifact from bilateral hip arthroplasties.   Stomach/Bowel: Small hiatal hernia. Stomach is decompressed. No small bowel obstruction or inflammation. Normal appendix. Small volume of colonic stool. There is colonic tortuosity. Diverticulosis from the splenic flexure distally. There is mild fat stranding adjacent to a diverticulum at the junction of the descending and sigmoid colon, series 2, image 52, suspicious for acute diverticulitis. No evidence of abscess or perforation. Suggestion of mild sigmoid colonic wall thickening diffusely. Portions of the sigmoid colon are obscured by streak artifact from bilateral hip arthroplasties.   Vascular/Lymphatic: Aorto bi-iliac atherosclerosis. No aortic aneurysm. Small retroperitoneal lymph nodes are not enlarged by size criteria. No enlarged lymph nodes in the abdomen or pelvis.   Reproductive: Hysterectomy.  Quiescent ovaries.  No adnexal mass.   Other: No free air, ascites, or focal fluid collection. There is a tiny fat containing umbilical  hernia.  Musculoskeletal: Bilateral hip arthroplasties. Diffuse degenerative change in the lumbar spine. There are no acute or suspicious osseous abnormalities.   IMPRESSION: 1. Mild fat stranding adjacent to a diverticulum at the junction of the descending and sigmoid colon, suspicious for acute diverticulitis. No perforation or abscess. 2. Suggestion of mild diffuse sigmoid colonic wall thickening. This is in the region of multiple diverticula. Findings may be due to mural hypertrophy, mild colitis is difficult to exclude. 3. Bilateral renal parenchymal thinning and areas of scarring. Bilateral renal cysts as well as scattered high-density lesions that are incompletely characterized on this noncontrast exam. One of these lesions in the lower left kidney appears increased in size from prior exam measuring 19 mm, with some internal high density. Recommend nonemergent renal protocol MRI for characterization on an elective basis, giving consideration to patient's advanced age. MRI should allow me considered if patient is able to tolerate breath hold technique. 4. Small hiatal hernia.   Aortic Atherosclerosis (ICD10-I70.0).     Electronically Signed   By: Keith Rake M.D.   On: 11/14/2020 17:36     Narrative & Impression  CLINICAL DATA:  Abdominal pain, recent hospitalization for subdural hematoma complicated by C difficile colitis   EXAM: CT ABDOMEN AND PELVIS WITHOUT CONTRAST   TECHNIQUE: Multidetector CT imaging of the abdomen and pelvis was performed following the standard protocol without IV contrast. Sagittal and coronal MPR images reconstructed from axial data set. No oral contrast administered.   COMPARISON:  11/14/2020   FINDINGS: Lower chest: Subsegmental atelectasis at lung bases. Minimal pericardial effusion.   Hepatobiliary: Gallbladder and liver normal appearance   Pancreas: Normal appearance   Spleen: Normal appearance   Adrenals/Urinary  Tract: Adrenal glands normal appearance. BILATERAL renal cortical atrophy. Small BILATERAL renal cysts. Several small hyperdense lesions within RIGHT kidney question hyperdense cysts, unchanged.   Stomach/Bowel: Wall thickening of the colon from the cecum through mid transverse colon consistent with colitis. Diverticulosis of descending and sigmoid colon with additional diffuse wall thickening. Minimal pericolic edema at the thickened segments. No definite wall thickening at the splenic flexure region. Small hiatal hernia. Stomach and small bowel loops otherwise normal appearance. Upper normal appendices size without inflammatory changes.   Vascular/Lymphatic: Atherosclerotic calcifications aorta and iliac arteries without aneurysm. Few scattered normal size mesenteric lymph nodes.   Reproductive: Uterus surgically absent.  Ovaries unremarkable.   Other: No free air or free fluid. Small umbilical hernia containing fat.   Musculoskeletal: BILATERAL hip prostheses. Osseous demineralization. Degenerative disc and facet disease changes lumbar spine.   IMPRESSION: Wall thickening of the colon from the cecum through mid transverse colon as well as sigmoid colon consistent with colitis; differential diagnosis includes infection and inflammatory bowel disease, potentially C difficile in light of recent history.   Distal colonic diverticulosis without evidence of diverticulitis.   Small hiatal hernia.   Small umbilical hernia containing fat.   BILATERAL renal cortical atrophy with small BILATERAL renal cysts and probable hyperdense RIGHT renal cysts.   Minimal pericardial effusion.   Aortic Atherosclerosis (ICD10-I70.0).     Electronically Signed   By: Lavonia Dana M.D.   On: 01/29/2021 11:55  I have independently reviewed the films.    Assessment & Plan:    1. rUTI's - criteria for recurrent UTI has been met with 2 or more infections in 6 months or 3 or greater infections  in one year  - patient is instructed to increase their water intake until the urine is pale yellow or  clear (10 to 12 cups daily)  - patient is instructed to take probiotics (yogurt, oral pills or vaginal suppositories), take cranberry pills or drink the juice and Vitamin C 1,000 mg daily to acidify the urine  -avoid soaking in tubs and wipe front to back after urinating  -today asymptomatic -CATH UA clear   2. Vaginal atrophy -encouraged to apply the vaginal estrogen cream three nights weekly  3. Bilateral renal cysts -some with internal high density -will repeat RUS in 3 months                                             Return in about 3 months (around 05/19/2021) for exam .  These notes generated with voice recognition software. I apologize for typographical errors.  Zara Council, PA-C  [facility] [address]  [place], [place] [address] [phone]

## 2021-02-16 ENCOUNTER — Ambulatory Visit (INDEPENDENT_AMBULATORY_CARE_PROVIDER_SITE_OTHER): Payer: Medicare PPO | Admitting: Urology

## 2021-02-16 ENCOUNTER — Encounter: Payer: Self-pay | Admitting: Urology

## 2021-02-16 ENCOUNTER — Other Ambulatory Visit: Payer: Self-pay

## 2021-02-16 VITALS — BP 132/63 | HR 69 | Ht 62.0 in | Wt 152.0 lb

## 2021-02-16 DIAGNOSIS — N390 Urinary tract infection, site not specified: Secondary | ICD-10-CM | POA: Diagnosis not present

## 2021-02-16 DIAGNOSIS — N281 Cyst of kidney, acquired: Secondary | ICD-10-CM

## 2021-02-16 DIAGNOSIS — N952 Postmenopausal atrophic vaginitis: Secondary | ICD-10-CM | POA: Diagnosis not present

## 2021-02-16 LAB — URINALYSIS, COMPLETE
Bilirubin, UA: NEGATIVE
Glucose, UA: NEGATIVE
Ketones, UA: NEGATIVE
Leukocytes,UA: NEGATIVE
Nitrite, UA: NEGATIVE
Protein,UA: NEGATIVE
RBC, UA: NEGATIVE
Specific Gravity, UA: 1.02 (ref 1.005–1.030)
Urobilinogen, Ur: 0.2 mg/dL (ref 0.2–1.0)
pH, UA: 5.5 (ref 5.0–7.5)

## 2021-02-16 LAB — MICROSCOPIC EXAMINATION: Bacteria, UA: NONE SEEN

## 2021-02-16 MED ORDER — ESTRADIOL 0.1 MG/GM VA CREA: TOPICAL_CREAM | VAGINAL | 12 refills | Status: DC

## 2021-02-16 NOTE — Patient Instructions (Signed)
Apply 0.5mg  (pea-sized amount)  just inside the vaginal introitus with a finger-tip on Monday, Wednesday and Friday nights,

## 2021-02-16 NOTE — Progress Notes (Signed)
In and Out Catheterization  Patient is present today for a I & O catheterization due to recurrent UTI. Patient was cleaned and prepped in a sterile fashion with betadine . A 14FR cath was inserted no complications were noted , 31ml of urine return was noted, urine was yellow in color. A clean urine sample was collected for UA. Bladder was drained  And catheter was removed with out difficulty.    Preformed by: Elberta Leatherwood

## 2021-02-17 DIAGNOSIS — I152 Hypertension secondary to endocrine disorders: Secondary | ICD-10-CM | POA: Diagnosis not present

## 2021-02-17 DIAGNOSIS — E1122 Type 2 diabetes mellitus with diabetic chronic kidney disease: Secondary | ICD-10-CM | POA: Diagnosis not present

## 2021-02-17 DIAGNOSIS — N184 Chronic kidney disease, stage 4 (severe): Secondary | ICD-10-CM | POA: Diagnosis not present

## 2021-02-17 DIAGNOSIS — E1140 Type 2 diabetes mellitus with diabetic neuropathy, unspecified: Secondary | ICD-10-CM | POA: Diagnosis not present

## 2021-02-17 DIAGNOSIS — I5032 Chronic diastolic (congestive) heart failure: Secondary | ICD-10-CM | POA: Diagnosis not present

## 2021-02-17 DIAGNOSIS — S065X0D Traumatic subdural hemorrhage without loss of consciousness, subsequent encounter: Secondary | ICD-10-CM | POA: Diagnosis not present

## 2021-02-17 DIAGNOSIS — E1159 Type 2 diabetes mellitus with other circulatory complications: Secondary | ICD-10-CM | POA: Diagnosis not present

## 2021-02-17 DIAGNOSIS — F0390 Unspecified dementia without behavioral disturbance: Secondary | ICD-10-CM | POA: Diagnosis not present

## 2021-02-17 DIAGNOSIS — E1165 Type 2 diabetes mellitus with hyperglycemia: Secondary | ICD-10-CM | POA: Diagnosis not present

## 2021-02-24 DIAGNOSIS — E1165 Type 2 diabetes mellitus with hyperglycemia: Secondary | ICD-10-CM | POA: Diagnosis not present

## 2021-02-24 DIAGNOSIS — F0390 Unspecified dementia without behavioral disturbance: Secondary | ICD-10-CM | POA: Diagnosis not present

## 2021-02-24 DIAGNOSIS — N184 Chronic kidney disease, stage 4 (severe): Secondary | ICD-10-CM | POA: Diagnosis not present

## 2021-02-24 DIAGNOSIS — S065X0D Traumatic subdural hemorrhage without loss of consciousness, subsequent encounter: Secondary | ICD-10-CM | POA: Diagnosis not present

## 2021-02-24 DIAGNOSIS — I5032 Chronic diastolic (congestive) heart failure: Secondary | ICD-10-CM | POA: Diagnosis not present

## 2021-02-24 DIAGNOSIS — E1140 Type 2 diabetes mellitus with diabetic neuropathy, unspecified: Secondary | ICD-10-CM | POA: Diagnosis not present

## 2021-02-24 DIAGNOSIS — I152 Hypertension secondary to endocrine disorders: Secondary | ICD-10-CM | POA: Diagnosis not present

## 2021-02-24 DIAGNOSIS — E1122 Type 2 diabetes mellitus with diabetic chronic kidney disease: Secondary | ICD-10-CM | POA: Diagnosis not present

## 2021-02-24 DIAGNOSIS — E1159 Type 2 diabetes mellitus with other circulatory complications: Secondary | ICD-10-CM | POA: Diagnosis not present

## 2021-02-25 ENCOUNTER — Telehealth: Payer: Self-pay

## 2021-02-25 ENCOUNTER — Ambulatory Visit: Payer: Medicare PPO

## 2021-02-25 DIAGNOSIS — F0390 Unspecified dementia without behavioral disturbance: Secondary | ICD-10-CM | POA: Diagnosis not present

## 2021-02-25 DIAGNOSIS — E1159 Type 2 diabetes mellitus with other circulatory complications: Secondary | ICD-10-CM | POA: Diagnosis not present

## 2021-02-25 DIAGNOSIS — E1140 Type 2 diabetes mellitus with diabetic neuropathy, unspecified: Secondary | ICD-10-CM | POA: Diagnosis not present

## 2021-02-25 DIAGNOSIS — E1122 Type 2 diabetes mellitus with diabetic chronic kidney disease: Secondary | ICD-10-CM | POA: Diagnosis not present

## 2021-02-25 DIAGNOSIS — I5032 Chronic diastolic (congestive) heart failure: Secondary | ICD-10-CM | POA: Diagnosis not present

## 2021-02-25 DIAGNOSIS — E1165 Type 2 diabetes mellitus with hyperglycemia: Secondary | ICD-10-CM | POA: Diagnosis not present

## 2021-02-25 DIAGNOSIS — N184 Chronic kidney disease, stage 4 (severe): Secondary | ICD-10-CM | POA: Diagnosis not present

## 2021-02-25 DIAGNOSIS — S065X0D Traumatic subdural hemorrhage without loss of consciousness, subsequent encounter: Secondary | ICD-10-CM | POA: Diagnosis not present

## 2021-02-25 DIAGNOSIS — I152 Hypertension secondary to endocrine disorders: Secondary | ICD-10-CM | POA: Diagnosis not present

## 2021-02-25 NOTE — Telephone Encounter (Signed)
Called patient to complete her AWV. Patient stated that she was busy right now and would not be able to complete this visit at this time. Advised patient to call and reschedule or provider  might complete at her upcoming physical. Appointment cancelled per patient request.

## 2021-02-26 ENCOUNTER — Other Ambulatory Visit: Payer: Self-pay

## 2021-02-26 ENCOUNTER — Other Ambulatory Visit (INDEPENDENT_AMBULATORY_CARE_PROVIDER_SITE_OTHER): Payer: Medicare PPO

## 2021-02-26 ENCOUNTER — Ambulatory Visit
Admission: RE | Admit: 2021-02-26 | Discharge: 2021-02-26 | Disposition: A | Discharge status: Home / Self Care | Payer: Medicare PPO | Source: Ambulatory Visit | Attending: Neurological Surgery | Admitting: Neurological Surgery

## 2021-02-26 ENCOUNTER — Telehealth: Payer: Self-pay | Admitting: Family Medicine

## 2021-02-26 DIAGNOSIS — I152 Hypertension secondary to endocrine disorders: Secondary | ICD-10-CM

## 2021-02-26 DIAGNOSIS — D631 Anemia in chronic kidney disease: Secondary | ICD-10-CM | POA: Diagnosis not present

## 2021-02-26 DIAGNOSIS — M10371 Gout due to renal impairment, right ankle and foot: Secondary | ICD-10-CM

## 2021-02-26 DIAGNOSIS — E1140 Type 2 diabetes mellitus with diabetic neuropathy, unspecified: Secondary | ICD-10-CM

## 2021-02-26 DIAGNOSIS — N184 Chronic kidney disease, stage 4 (severe): Secondary | ICD-10-CM | POA: Diagnosis not present

## 2021-02-26 DIAGNOSIS — E559 Vitamin D deficiency, unspecified: Secondary | ICD-10-CM

## 2021-02-26 DIAGNOSIS — R42 Dizziness and giddiness: Secondary | ICD-10-CM | POA: Diagnosis not present

## 2021-02-26 DIAGNOSIS — S065X9A Traumatic subdural hemorrhage with loss of consciousness of unspecified duration, initial encounter: Secondary | ICD-10-CM

## 2021-02-26 DIAGNOSIS — S065XAA Traumatic subdural hemorrhage with loss of consciousness status unknown, initial encounter: Secondary | ICD-10-CM

## 2021-02-26 DIAGNOSIS — I6782 Cerebral ischemia: Secondary | ICD-10-CM | POA: Diagnosis not present

## 2021-02-26 LAB — COMPREHENSIVE METABOLIC PANEL
ALT: 6 U/L (ref 0–35)
AST: 14 U/L (ref 0–37)
Albumin: 3.5 g/dL (ref 3.5–5.2)
Alkaline Phosphatase: 96 U/L (ref 39–117)
BUN: 35 mg/dL — ABNORMAL HIGH (ref 6–23)
CO2: 27 mEq/L (ref 19–32)
Calcium: 8.9 mg/dL (ref 8.4–10.5)
Chloride: 105 mEq/L (ref 96–112)
Creatinine, Ser: 1.79 mg/dL — ABNORMAL HIGH (ref 0.40–1.20)
GFR: 25.27 mL/min — ABNORMAL LOW (ref >60.00)
Glucose, Bld: 227 mg/dL — ABNORMAL HIGH (ref 70–99)
Potassium: 4.8 mEq/L (ref 3.5–5.1)
Sodium: 140 mEq/L (ref 135–145)
Total Bilirubin: 0.4 mg/dL (ref 0.2–1.2)
Total Protein: 6.1 g/dL (ref 6.0–8.3)

## 2021-02-26 LAB — LIPID PANEL
Cholesterol: 279 mg/dL — ABNORMAL HIGH (ref 0–200)
HDL: 50.9 mg/dL (ref >39.00)
NonHDL: 228.22
Total CHOL/HDL Ratio: 5
Triglycerides: 271 mg/dL — ABNORMAL HIGH (ref 0.0–149.0)
VLDL: 54.2 mg/dL — ABNORMAL HIGH (ref 0.0–40.0)

## 2021-02-26 LAB — CBC WITH DIFFERENTIAL/PLATELET
Basophils Absolute: 0 10*3/uL (ref 0.0–0.1)
Basophils Relative: 0.5 % (ref 0.0–3.0)
Eosinophils Absolute: 0.2 10*3/uL (ref 0.0–0.7)
Eosinophils Relative: 2.3 % (ref 0.0–5.0)
HCT: 33.4 % — ABNORMAL LOW (ref 36.0–46.0)
Hemoglobin: 11 g/dL — ABNORMAL LOW (ref 12.0–15.0)
Lymphocytes Relative: 60.3 % — ABNORMAL HIGH (ref 12.0–46.0)
Lymphs Abs: 4.2 10*3/uL — ABNORMAL HIGH (ref 0.7–4.0)
MCHC: 32.8 g/dL (ref 30.0–36.0)
MCV: 92.5 fl (ref 78.0–100.0)
Monocytes Absolute: 0.8 10*3/uL (ref 0.1–1.0)
Monocytes Relative: 11.7 % (ref 3.0–12.0)
Neutro Abs: 1.7 10*3/uL (ref 1.4–7.7)
Neutrophils Relative %: 25.2 % — ABNORMAL LOW (ref 43.0–77.0)
Platelets: 216 10*3/uL (ref 150.0–400.0)
RBC: 3.61 Mil/uL — ABNORMAL LOW (ref 3.87–5.11)
RDW: 15.8 % — ABNORMAL HIGH (ref 11.5–15.5)
WBC: 6.9 10*3/uL (ref 4.0–10.5)

## 2021-02-26 LAB — IBC + FERRITIN
Ferritin: 47.5 ng/mL (ref 10.0–291.0)
Iron: 63 ug/dL (ref 42–145)
Saturation Ratios: 19.6 % — ABNORMAL LOW (ref 20.0–50.0)
Transferrin: 230 mg/dL (ref 212.0–360.0)

## 2021-02-26 LAB — HEMOGLOBIN A1C: Hgb A1c MFr Bld: 6.3 % (ref 4.6–6.5)

## 2021-02-26 LAB — URIC ACID: Uric Acid, Serum: 11.7 mg/dL — ABNORMAL HIGH (ref 2.4–7.0)

## 2021-02-26 LAB — LDL CHOLESTEROL, DIRECT: Direct LDL: 169 mg/dL

## 2021-02-26 LAB — VITAMIN D 25 HYDROXY (VIT D DEFICIENCY, FRACTURES): VITD: 64.12 ng/mL (ref 30.00–100.00)

## 2021-02-26 NOTE — Progress Notes (Signed)
No critical labs need to be addressed urgently. We will discuss labs in detail at upcoming office visit.   

## 2021-02-26 NOTE — Telephone Encounter (Signed)
Labs

## 2021-03-01 DIAGNOSIS — S065X9A Traumatic subdural hemorrhage with loss of consciousness of unspecified duration, initial encounter: Secondary | ICD-10-CM | POA: Diagnosis not present

## 2021-03-02 DIAGNOSIS — E1159 Type 2 diabetes mellitus with other circulatory complications: Secondary | ICD-10-CM | POA: Diagnosis not present

## 2021-03-02 DIAGNOSIS — E1122 Type 2 diabetes mellitus with diabetic chronic kidney disease: Secondary | ICD-10-CM | POA: Diagnosis not present

## 2021-03-02 DIAGNOSIS — E1165 Type 2 diabetes mellitus with hyperglycemia: Secondary | ICD-10-CM | POA: Diagnosis not present

## 2021-03-02 DIAGNOSIS — E1140 Type 2 diabetes mellitus with diabetic neuropathy, unspecified: Secondary | ICD-10-CM | POA: Diagnosis not present

## 2021-03-02 DIAGNOSIS — I152 Hypertension secondary to endocrine disorders: Secondary | ICD-10-CM | POA: Diagnosis not present

## 2021-03-02 DIAGNOSIS — S065X0D Traumatic subdural hemorrhage without loss of consciousness, subsequent encounter: Secondary | ICD-10-CM | POA: Diagnosis not present

## 2021-03-02 DIAGNOSIS — I5032 Chronic diastolic (congestive) heart failure: Secondary | ICD-10-CM | POA: Diagnosis not present

## 2021-03-02 DIAGNOSIS — N184 Chronic kidney disease, stage 4 (severe): Secondary | ICD-10-CM | POA: Diagnosis not present

## 2021-03-02 DIAGNOSIS — F0390 Unspecified dementia without behavioral disturbance: Secondary | ICD-10-CM | POA: Diagnosis not present

## 2021-03-04 DIAGNOSIS — S065X0D Traumatic subdural hemorrhage without loss of consciousness, subsequent encounter: Secondary | ICD-10-CM | POA: Diagnosis not present

## 2021-03-04 DIAGNOSIS — I5032 Chronic diastolic (congestive) heart failure: Secondary | ICD-10-CM | POA: Diagnosis not present

## 2021-03-04 DIAGNOSIS — E1122 Type 2 diabetes mellitus with diabetic chronic kidney disease: Secondary | ICD-10-CM | POA: Diagnosis not present

## 2021-03-04 DIAGNOSIS — F0390 Unspecified dementia without behavioral disturbance: Secondary | ICD-10-CM | POA: Diagnosis not present

## 2021-03-04 DIAGNOSIS — E1165 Type 2 diabetes mellitus with hyperglycemia: Secondary | ICD-10-CM | POA: Diagnosis not present

## 2021-03-04 DIAGNOSIS — E1159 Type 2 diabetes mellitus with other circulatory complications: Secondary | ICD-10-CM | POA: Diagnosis not present

## 2021-03-04 DIAGNOSIS — N184 Chronic kidney disease, stage 4 (severe): Secondary | ICD-10-CM | POA: Diagnosis not present

## 2021-03-04 DIAGNOSIS — I152 Hypertension secondary to endocrine disorders: Secondary | ICD-10-CM | POA: Diagnosis not present

## 2021-03-04 DIAGNOSIS — E1140 Type 2 diabetes mellitus with diabetic neuropathy, unspecified: Secondary | ICD-10-CM | POA: Diagnosis not present

## 2021-03-05 ENCOUNTER — Other Ambulatory Visit: Payer: Self-pay

## 2021-03-05 ENCOUNTER — Encounter: Payer: Self-pay | Admitting: Family Medicine

## 2021-03-05 ENCOUNTER — Ambulatory Visit (INDEPENDENT_AMBULATORY_CARE_PROVIDER_SITE_OTHER): Payer: Medicare PPO | Admitting: Family Medicine

## 2021-03-05 VITALS — BP 132/74 | HR 65 | Temp 98.0°F | Ht 62.0 in | Wt 156.0 lb

## 2021-03-05 DIAGNOSIS — I5032 Chronic diastolic (congestive) heart failure: Secondary | ICD-10-CM | POA: Diagnosis not present

## 2021-03-05 DIAGNOSIS — Z0000 Encounter for general adult medical examination without abnormal findings: Secondary | ICD-10-CM | POA: Diagnosis not present

## 2021-03-05 DIAGNOSIS — E785 Hyperlipidemia, unspecified: Secondary | ICD-10-CM

## 2021-03-05 DIAGNOSIS — J449 Chronic obstructive pulmonary disease, unspecified: Secondary | ICD-10-CM

## 2021-03-05 DIAGNOSIS — F0390 Unspecified dementia without behavioral disturbance: Secondary | ICD-10-CM

## 2021-03-05 DIAGNOSIS — S065X9A Traumatic subdural hemorrhage with loss of consciousness of unspecified duration, initial encounter: Secondary | ICD-10-CM

## 2021-03-05 DIAGNOSIS — N184 Chronic kidney disease, stage 4 (severe): Secondary | ICD-10-CM | POA: Diagnosis not present

## 2021-03-05 DIAGNOSIS — E1169 Type 2 diabetes mellitus with other specified complication: Secondary | ICD-10-CM

## 2021-03-05 DIAGNOSIS — E1140 Type 2 diabetes mellitus with diabetic neuropathy, unspecified: Secondary | ICD-10-CM

## 2021-03-05 DIAGNOSIS — F03A0 Unspecified dementia, mild, without behavioral disturbance, psychotic disturbance, mood disturbance, and anxiety: Secondary | ICD-10-CM

## 2021-03-05 DIAGNOSIS — M10371 Gout due to renal impairment, right ankle and foot: Secondary | ICD-10-CM

## 2021-03-05 DIAGNOSIS — E1159 Type 2 diabetes mellitus with other circulatory complications: Secondary | ICD-10-CM

## 2021-03-05 DIAGNOSIS — A0471 Enterocolitis due to Clostridium difficile, recurrent: Secondary | ICD-10-CM

## 2021-03-05 DIAGNOSIS — I152 Hypertension secondary to endocrine disorders: Secondary | ICD-10-CM

## 2021-03-05 DIAGNOSIS — N390 Urinary tract infection, site not specified: Secondary | ICD-10-CM

## 2021-03-05 DIAGNOSIS — S329XXD Fracture of unspecified parts of lumbosacral spine and pelvis, subsequent encounter for fracture with routine healing: Secondary | ICD-10-CM

## 2021-03-05 DIAGNOSIS — S065XAA Traumatic subdural hemorrhage with loss of consciousness status unknown, initial encounter: Secondary | ICD-10-CM

## 2021-03-05 DIAGNOSIS — E348 Other specified endocrine disorders: Secondary | ICD-10-CM

## 2021-03-05 MED ORDER — VITAMIN D (ERGOCALCIFEROL) 1.25 MG (50000 UNIT) PO CAPS: 50000.0000 [IU] | ORAL_CAPSULE | ORAL | 3 refills | Status: DC

## 2021-03-05 MED ORDER — ALLOPURINOL 100 MG PO TABS: 50.0000 mg | ORAL_TABLET | ORAL | 11 refills | Status: DC

## 2021-03-05 NOTE — Patient Instructions (Addendum)
We will make referral to infectious disease.  Start allopurinol 1/2 tab every other day for gout prevention. Check blood sugar every few days.. fasting sugar  goal < 120.  We will get  you set up with Bone density.  Low-Purine Eating Plan A low-purine eating plan involves making food choices to limit your intake of purine. Purine is a kind of uric acid. Too much uric acid in your blood can cause certain conditions, such as gout and kidney stones. Eating a low-purinediet can help control these conditions.  What are tips for following this plan? Reading food labels Avoid foods with saturated or Trans fat. Check the ingredient list of grains-based foods, such as bread and cereal, to make sure that they contain whole grains. Check the ingredient list of sauces or soups to make sure they do not contain meat or fish. When choosing soft drinks, check the ingredient list to make sure they do not contain high-fructose corn syrup. Shopping  Buy plenty of fresh fruits and vegetables. Avoid buying canned or fresh fish. Buy dairy products labeled as low-fat or nonfat. Avoid buying premade or processed foods. These foods are often high in fat, salt (sodium), and added sugar.  Cooking Use olive oil instead of butter when cooking. Oils like olive oil, canola oil, and sunflower oil contain healthy fats. Meal planning Learn which foods do or do not affect you. If you find out that a food tends to cause your gout symptoms to flare up, avoid eating that food. You can enjoy foods that do not cause problems. If you have any questions about a food item, talk with your dietitian or health care provider. Limit foods high in fat, especially saturated fat. Fat makes it harder for your body to get rid of uric acid. Choose foods that are lower in fat and are lean sources of protein. General guidelines Limit alcohol intake to no more than 1 drink a day for nonpregnant women and 2 drinks a day for men. One drink equals  12 oz of beer, 5 oz of wine, or 1 oz of hard liquor. Alcohol can affect the way your body gets rid of uric acid. Drink plenty of water to keep your urine clear or pale yellow. Fluids can help remove uric acid from your body. If directed by your health care provider, take a vitamin C supplement. Work with your health care provider and dietitian to develop a plan to achieve or maintain a healthy weight. Losing weight can help reduce uric acid in your blood. What foods are recommended? The items listed may not be a complete list. Talk with your dietitian aboutwhat dietary choices are best for you. Foods low in purines Foods low in purines do not need to be limited. These include: All fruits. All low-purine vegetables, pickles, and olives. Breads, pasta, rice, cornbread, and popcorn. Cake and other baked goods. All dairy foods. Eggs, nuts, and nut butters. Spices and condiments, such as salt, herbs, and vinegar. Plant oils, butter, and margarine. Water, sugar-free soft drinks, tea, coffee, and cocoa. Vegetable-based soups, broths, sauces, and gravies. Foods moderate in purines Foods moderate in purines should be limited to the amounts listed.  cup of asparagus, cauliflower, spinach, mushrooms, or green peas, each day. 2/3 cup uncooked oatmeal, each day.  cup dry wheat bran or wheat germ, each day. 2-3 ounces of meat or poultry, each day. 4-6 ounces of shellfish, such as crab, lobster, oysters, or shrimp, each day. 1 cup cooked beans, peas, or lentils,  each day. Soup, broths, or bouillon made from meat or fish. Limit these foods as much as possible. What foods are not recommended? The items listed may not be a complete list. Talk with your dietitian aboutwhat dietary choices are best for you. Limit your intake of foods high in purines, including: Beer and other alcohol. Meat-based gravy or sauce. Canned or fresh fish, such as: Anchovies, sardines, herring, and tuna. Mussels and  scallops. Codfish, trout, and haddock. Berniece Salines. Organ meats, such as: Liver or kidney. Tripe. Sweetbreads (thymus gland or pancreas). Wild Clinical biochemist. Yeast or yeast extract supplements. Drinks sweetened with high-fructose corn syrup. Summary Eating a low-purine diet can help control conditions caused by too much uric acid in the body, such as gout or kidney stones. Choose low-purine foods, limit alcohol, and limit foods high in fat. You will learn over time which foods do or do not affect you. If you find out that a food tends to cause your gout symptoms to flare up, avoid eating that food. This information is not intended to replace advice given to you by your health care provider. Make sure you discuss any questions you have with your healthcare provider. Document Revised: 11/21/2019 Document Reviewed: 11/21/2019 Elsevier Patient Education  [employer].

## 2021-03-05 NOTE — Progress Notes (Signed)
Patient ID: Jill [name], female    DOB: November 08, 1931, 87 y.o.   MRN: [id number]  This visit was conducted in person.  Pulse 65   Temp 98 F (36.7 C) (Temporal)   Ht 5\' 2"  (1.575 m)   Wt 156 lb (70.8 kg)   SpO2 97%   BMI 28.53 kg/m    CC:  Chief Complaint  Patient presents with   Annual Exam    Subjective:   HPI: The patient presents for annual medicare wellness, complete physical and review of chronic health problems. He/She also has the following acute concerns today:  I have personally reviewed the Medicare Annual Wellness questionnaire and have noted 1. The patient's medical and social history 2. Their use of alcohol, tobacco or illicit drugs 3. Their current medications and supplements 4. The patient's functional ability including ADL's, fall risks, home safety risks and hearing or visual             impairment. 5. Diet and physical activities 6. Evidence for depression or mood disorders 7.         Updated provider list Cognitive evaluation was performed and recorded on pt medicare questionnaire form. The patients weight, height, BMI and visual acuity have been recorded in the chart   I have made referrals, counseling and provided education to the patient based review of the above and I have provided the pt with a written personalized care plan for preventive services.   Documentation of this information was scanned into the electronic record under the media tab.   Advance directives and end of life planning reviewed in detail with patient and documented in EMR. Patient given handout on advance care directives if needed. HCPOA and living will updated if needed.  Fall Risk  03/05/2021 08/13/2019 08/08/2018 07/03/2017 06/30/2016  Falls in the past year? 1 0 0 Yes Yes  Comment - - - 2-3 falls in past year due to feet "getting tangled up" -  Number falls in past yr: 1 0 - 2 or more 1  Injury with Fall? 1 0 - Yes Yes  Risk Factor Category  - - - - -  Risk for fall due  to : - Medication side effect;History of fall(s) - - Impaired balance/gait  Follow up - Falls prevention discussed;Falls evaluation completed - - Falls evaluation completed   [place] from 08/13/2019 in [place] [facility] Total Score 0       Gout:  uric acid increase at 11.7 Last flare 3 months ago.  Diabetes:  She was not fasting prior to labs Lab Results  Component Value Date   HGBA1C 6.3 02/26/2021  Using medications without difficulties: Hypoglycemic episodes: Hyperglycemic episodes: Feet problems: no ulcers Blood Sugars averaging: not checking lately. eye exam within last year: Associated with neuropathy and CKD.. stable.  COPD Feels symptoms are well controlled: yes Using medications without problems: Spiriva Night time symptoms:  none ER visits since last visit: Sputum production at baseline: none Increased cough:none Increased SOB: none Using O2: no  Mild dementia   Hypertension:    BP Readings from Last 3 Encounters:  03/05/21 132/74  02/16/21 132/63  02/04/21 112/68  Using medication without problems or lightheadedness: none Chest pain with exertion:none Edema:none Short of breath: none Average home BPs: Other issues:  Elevated Cholesterol:  Not at goal with DM but given age...tolerated. Statins cause myalgia. Lab Results  Component Value Date   CHOL 279 (H) 02/26/2021  HDL 50.90 02/26/2021   LDLCALC 117 (H) 08/08/2018   LDLDIRECT 169.0 02/26/2021   TRIG 271.0 (H) 02/26/2021   CHOLHDL 5 02/26/2021  Using medications without problems: Muscle aches:  Diet compliance:moderate Exercise:minimal Other complaints:  Chronic diastolic heart failure, euvolemic in office today  Recent recurrent C difficile.Marland Kitchen 3 episodes.eating better overall.  Wt Readings from Last 3 Encounters:  03/05/21 156 lb (70.8 kg)  02/16/21 152 lb (68.9 kg)  02/04/21 155 lb (70.3 kg)    Subdural hematoma: Followed by   neurosurgery.. Stable CT head 02/26/2021.. no further treatment.  Pelvic fracture: followed by ORTHO, minimal pain.  Frequent  falls..  staying with family.  No falls in last month.  Recurrent UTI: followed by Dr. Anitra Lauth.. continuing premarin cream.     Relevant past medical, surgical, family and social history reviewed and updated as indicated. Interim medical history since our last visit reviewed. Allergies and medications reviewed and updated. Outpatient Medications Prior to Visit  Medication Sig Dispense Refill   acetaminophen (TYLENOL) 500 MG tablet Take 1,000 mg by mouth every 6 (six) hours as needed for moderate pain or headache.     cetirizine (ZYRTEC) 5 MG tablet Take 1 tablet (5 mg total) by mouth daily. 90 tablet 0   diclofenac Sodium (VOLTAREN) 1 % GEL Apply 2 g topically 4 (four) times daily as needed.     donepezil (ARICEPT) 5 MG tablet TAKE 1 TABLET BY MOUTH EVERYDAY AT BEDTIME 90 tablet 0   escitalopram (LEXAPRO) 20 MG tablet TAKE 1 TABLET BY MOUTH EVERY DAY 90 tablet 1   estradiol (ESTRACE VAGINAL) 0.1 MG/GM vaginal cream Apply 0.5mg  (pea-sized amount)  just inside the vaginal introitus with a finger-tip on Monday, Wednesday and Friday nights. 30 g 12   hydrOXYzine (ATARAX/VISTARIL) 10 MG tablet TAKE 0.5-1 TABLETS (5-10 MG TOTAL) BY MOUTH DAILY AS NEEDED FOR ANXIETY. 90 tablet 0   Multiple Vitamins-Minerals (MULTIVITAMIN WITH MINERALS) tablet Take 1 tablet by mouth daily.     TART CHERRY PO Take 1 tablet by mouth in the morning and at bedtime.     tiotropium (SPIRIVA HANDIHALER) 18 MCG inhalation capsule INHALE 1 CAPSULE VIA HANDIHALER ONCE DAILY AT THE SAME TIME EVERY DAY 30 capsule 5   Vitamin D, Ergocalciferol, (DRISDOL) 1.25 MG (50000 UNIT) CAPS capsule TAKE 1 CAPSULE (50,000 UNITS TOTAL) BY MOUTH EVERY FRIDAY. 12 capsule 3   traMADol (ULTRAM) 50 MG tablet Take 50 mg by mouth every 8 (eight) hours as needed for moderate pain.     No facility-administered medications  prior to visit.     Per HPI unless specifically indicated in ROS section below Review of Systems  Constitutional:  Positive for fatigue. Negative for fever.  HENT:  Negative for congestion.   Eyes:  Negative for pain.  Respiratory:  Negative for cough and shortness of breath.   Cardiovascular:  Negative for chest pain, palpitations and leg swelling.  Gastrointestinal:  Negative for abdominal pain.  Genitourinary:  Negative for dysuria and vaginal bleeding.  Musculoskeletal:  Negative for back pain.  Neurological:  Negative for syncope, light-headedness and headaches.  Psychiatric/Behavioral:  Negative for dysphoric mood.   Objective:  Pulse 65   Temp 98 F (36.7 C) (Temporal)   Ht 5\' 2"  (1.575 m)   Wt 156 lb (70.8 kg)   SpO2 97%   BMI 28.53 kg/m   Wt Readings from Last 3 Encounters:  03/05/21 156 lb (70.8 kg)  02/16/21 152 lb (68.9 kg)  02/04/21 155 lb (  70.3 kg)      Physical Exam Constitutional:      General: She is not in acute distress.    Appearance: Normal appearance. She is well-developed. She is not ill-appearing or toxic-appearing.     Comments: In wheelchair  HENT:     Head: Normocephalic.     Right Ear: Hearing, tympanic membrane, ear canal and external ear normal. Tympanic membrane is not erythematous, retracted or bulging.     Left Ear: Hearing, tympanic membrane, ear canal and external ear normal. Tympanic membrane is not erythematous, retracted or bulging.     Nose: No mucosal edema or rhinorrhea.     Right Sinus: No maxillary sinus tenderness or frontal sinus tenderness.     Left Sinus: No maxillary sinus tenderness or frontal sinus tenderness.     Mouth/Throat:     Pharynx: Uvula midline.  Eyes:     General: Lids are normal. Lids are everted, no foreign bodies appreciated.     Conjunctiva/sclera: Conjunctivae normal.     Pupils: Pupils are equal, round, and reactive to light.  Neck:     Thyroid: No thyroid mass or thyromegaly.     Vascular: No carotid  bruit.     Trachea: Trachea normal.  Cardiovascular:     Rate and Rhythm: Normal rate and regular rhythm.     Pulses: Normal pulses.     Heart sounds: Normal heart sounds, S1 normal and S2 normal. No murmur heard.   No friction rub. No gallop.  Pulmonary:     Effort: Pulmonary effort is normal. No tachypnea or respiratory distress.     Breath sounds: Normal breath sounds. No decreased breath sounds, wheezing, rhonchi or rales.  Abdominal:     General: Bowel sounds are normal.     Palpations: Abdomen is soft.     Tenderness: There is no abdominal tenderness.  Musculoskeletal:     Cervical back: Normal range of motion and neck supple.  Skin:    General: Skin is warm and dry.     Findings: No rash.  Neurological:     Mental Status: She is alert.  Psychiatric:        Mood and Affect: Mood is not anxious or depressed.        Speech: Speech normal.        Behavior: Behavior normal. Behavior is cooperative.        Thought Content: Thought content normal.        Judgment: Judgment normal.      Results for orders placed or performed in visit on 02/26/21  VITAMIN D 25 Hydroxy (Vit-D Deficiency, Fractures)  Result Value Ref Range   VITD 64.12 30.00 - 100.00 ng/mL  Uric acid  Result Value Ref Range   Uric Acid, Serum 11.7 (H) 2.4 - 7.0 mg/dL  Comprehensive metabolic panel  Result Value Ref Range   Sodium 140 135 - 145 mEq/L   Potassium 4.8 3.5 - 5.1 mEq/L   Chloride 105 96 - 112 mEq/L   CO2 27 19 - 32 mEq/L   Glucose, Bld 227 (H) 70 - 99 mg/dL   BUN 35 (H) 6 - 23 mg/dL   Creatinine, Ser 1.79 (H) 0.40 - 1.20 mg/dL   Total Bilirubin 0.4 0.2 - 1.2 mg/dL   Alkaline Phosphatase 96 39 - 117 U/L   AST 14 0 - 37 U/L   ALT 6 0 - 35 U/L   Total Protein 6.1 6.0 - 8.3 g/dL   Albumin 3.5 3.5 -  5.2 g/dL   GFR 25.27 (L) >60.00 mL/min   Calcium 8.9 8.4 - 10.5 mg/dL  Hemoglobin A1c  Result Value Ref Range   Hgb A1c MFr Bld 6.3 4.6 - 6.5 %  Lipid panel  Result Value Ref Range    Cholesterol 279 (H) 0 - 200 mg/dL   Triglycerides 271.0 (H) 0.0 - 149.0 mg/dL   HDL 50.90 >39.00 mg/dL   VLDL 54.2 (H) 0.0 - 40.0 mg/dL   Total CHOL/HDL Ratio 5    NonHDL 228.22   IBC + Ferritin  Result Value Ref Range   Iron 63 42 - 145 ug/dL   Transferrin 230.0 212.0 - 360.0 mg/dL   Saturation Ratios 19.6 (L) 20.0 - 50.0 %   Ferritin 47.5 10.0 - 291.0 ng/mL  CBC with Differential/Platelet  Result Value Ref Range   WBC 6.9 4.0 - 10.5 K/uL   RBC 3.61 (L) 3.87 - 5.11 Mil/uL   Hemoglobin 11.0 (L) 12.0 - 15.0 g/dL   HCT 33.4 (L) 36.0 - 46.0 %   MCV 92.5 78.0 - 100.0 fl   MCHC 32.8 30.0 - 36.0 g/dL   RDW 15.8 (H) 11.5 - 15.5 %   Platelets 216.0 150.0 - 400.0 K/uL   Neutrophils Relative % 25.2 (L) 43.0 - 77.0 %   Lymphocytes Relative 60.3 (H) 12.0 - 46.0 %   Monocytes Relative 11.7 3.0 - 12.0 %   Eosinophils Relative 2.3 0.0 - 5.0 %   Basophils Relative 0.5 0.0 - 3.0 %   Neutro Abs 1.7 1.4 - 7.7 K/uL   Lymphs Abs 4.2 (H) 0.7 - 4.0 K/uL   Monocytes Absolute 0.8 0.1 - 1.0 K/uL   Eosinophils Absolute 0.2 0.0 - 0.7 K/uL   Basophils Absolute 0.0 0.0 - 0.1 K/uL  LDL cholesterol, direct  Result Value Ref Range   Direct LDL 169.0 mg/dL    This visit occurred during the SARS-CoV-2 public health emergency.  Safety protocols were in place, including screening questions prior to the visit, additional usage of staff PPE, and extensive cleaning of exam room while observing appropriate contact time as indicated for disinfecting solutions.   COVID 19 screen:  No recent travel or known exposure to COVID19 The patient denies respiratory symptoms of COVID 19 at this time. The importance of social distancing was discussed today.   Assessment and Plan The patient's preventative maintenance and recommended screening tests for an annual wellness exam were reviewed in full today. Brought up to date unless services declined.  Counselled on the importance of diet, exercise, and its role in overall  health and mortality. The patient's FH and SH was reviewed, including their home life, tobacco status, and drug and alcohol status.    Reviewed vaccines  No indication for colonoscopy, mammogram, PAP/DVE  DEXA: pt will consider given frequent falls and recent pelvic fracture.  Last normal in 2009. Problem List Items Addressed This Visit     Chronic diastolic congestive heart failure (HCC)    Chronic, stable Euvolemic in office today      CKD (chronic kidney disease) stage 4, GFR 15-29 ml/min (HCC)   Gout involving toe of right foot    Start low renal dose of allopurinol. Low uric acid diet. Follow up uric acid levels.      Relevant Medications   allopurinol (ZYLOPRIM) 100 MG tablet   Hyperlipidemia associated with type 2 diabetes mellitus ([place])     Not at goal with DM but given age...tolerated. Statins cause myalgia.  Hypertension associated with diabetes (HCC)    Stable, chronic.  Continue current medication.         Mild dementia (HCC)    Stable control on aricept 5 mg daily.      Neuropathy due to type 2 diabetes mellitus ([place])   Pelvic fracture (HCC)    followed by ORTHO, minimal pain.      Recurrent Clostridioides difficile diarrhea    Refer to ID for consideration of further treatment given 2 episodes of Cdiff. In last 3 months.  Avoid antibiotic use unless absolutely necessary.      Relevant Orders   Ambulatory referral to Infectious Disease   Recurrent UTI    Continue estrogen cream.      Stage 2 moderate COPD by GOLD classification ([place])   Subdural hematoma (HCC)    Followed by  neurosurgery.. Stable CT head 02/26/2021.. no further treatment.       Type 2 diabetes mellitus with diabetic neuropathy, unspecified ([place])   Other Visit Diagnoses     Medicare annual wellness visit, subsequent    -  Primary   Estradiol deficiency       Relevant Orders   DG Bone Density        Eliezer Lofts, MD

## 2021-03-09 DIAGNOSIS — F0390 Unspecified dementia without behavioral disturbance: Secondary | ICD-10-CM | POA: Diagnosis not present

## 2021-03-09 DIAGNOSIS — E1165 Type 2 diabetes mellitus with hyperglycemia: Secondary | ICD-10-CM | POA: Diagnosis not present

## 2021-03-09 DIAGNOSIS — N184 Chronic kidney disease, stage 4 (severe): Secondary | ICD-10-CM | POA: Diagnosis not present

## 2021-03-09 DIAGNOSIS — E1140 Type 2 diabetes mellitus with diabetic neuropathy, unspecified: Secondary | ICD-10-CM | POA: Diagnosis not present

## 2021-03-09 DIAGNOSIS — S065X0D Traumatic subdural hemorrhage without loss of consciousness, subsequent encounter: Secondary | ICD-10-CM | POA: Diagnosis not present

## 2021-03-09 DIAGNOSIS — I152 Hypertension secondary to endocrine disorders: Secondary | ICD-10-CM | POA: Diagnosis not present

## 2021-03-09 DIAGNOSIS — I5032 Chronic diastolic (congestive) heart failure: Secondary | ICD-10-CM | POA: Diagnosis not present

## 2021-03-09 DIAGNOSIS — E1159 Type 2 diabetes mellitus with other circulatory complications: Secondary | ICD-10-CM | POA: Diagnosis not present

## 2021-03-09 DIAGNOSIS — E1122 Type 2 diabetes mellitus with diabetic chronic kidney disease: Secondary | ICD-10-CM | POA: Diagnosis not present

## 2021-03-10 DIAGNOSIS — E1159 Type 2 diabetes mellitus with other circulatory complications: Secondary | ICD-10-CM | POA: Diagnosis not present

## 2021-03-10 DIAGNOSIS — N184 Chronic kidney disease, stage 4 (severe): Secondary | ICD-10-CM | POA: Diagnosis not present

## 2021-03-10 DIAGNOSIS — E1165 Type 2 diabetes mellitus with hyperglycemia: Secondary | ICD-10-CM | POA: Diagnosis not present

## 2021-03-10 DIAGNOSIS — I5032 Chronic diastolic (congestive) heart failure: Secondary | ICD-10-CM | POA: Diagnosis not present

## 2021-03-10 DIAGNOSIS — I152 Hypertension secondary to endocrine disorders: Secondary | ICD-10-CM | POA: Diagnosis not present

## 2021-03-10 DIAGNOSIS — E1140 Type 2 diabetes mellitus with diabetic neuropathy, unspecified: Secondary | ICD-10-CM | POA: Diagnosis not present

## 2021-03-10 DIAGNOSIS — E1122 Type 2 diabetes mellitus with diabetic chronic kidney disease: Secondary | ICD-10-CM | POA: Diagnosis not present

## 2021-03-10 DIAGNOSIS — S065X0D Traumatic subdural hemorrhage without loss of consciousness, subsequent encounter: Secondary | ICD-10-CM | POA: Diagnosis not present

## 2021-03-10 DIAGNOSIS — F0390 Unspecified dementia without behavioral disturbance: Secondary | ICD-10-CM | POA: Diagnosis not present

## 2021-03-18 ENCOUNTER — Telehealth: Payer: Self-pay | Admitting: *Deleted

## 2021-03-18 NOTE — Telephone Encounter (Signed)
  Estill Bamberg notified as instructed by telephone.  She wants to discuss this with her daughter and she will MyChart [place] with what they decide.   Please contact granddaughter/daughter/caregiver.. let them know about this. She was to be seen for recurrent Cdiff... If she is doing well we can hold off but there is increased risk of further recurrence. I would like to document that pt and family understand increased risk of recurrence.  Eliezer Lofts, MD [place] [facility]  ----- Message ----- From: Bing Quarry Sent: 03/17/2021   5:04 PM EDT To: Jinny Sanders, MD Subject: referral                                       Hi Amy  You sent a new patient referral for Jill [name]  I called her to schedule and she advised to cancel this that she has been to so many doctors she did not want to go to any more. Wanted to let you know. Do you want to talk with patient? I schedule for Dr Delaine Lame in [place] as well as [facility].  Thanks

## 2021-03-19 NOTE — Telephone Encounter (Signed)
Per MyChart message from [place]:  Butch Penny,  we agree that she needs to see infectious disease.  Can you have the office either contact me at [phone] or my daughter, [name], who lives with [facility]([phone]) to schedule the appointment and we will makes sure she gets there.

## 2021-03-23 ENCOUNTER — Other Ambulatory Visit: Payer: Self-pay

## 2021-03-23 ENCOUNTER — Ambulatory Visit: Payer: Medicare PPO | Attending: Infectious Diseases | Admitting: Infectious Diseases

## 2021-03-23 ENCOUNTER — Encounter: Payer: Self-pay | Admitting: Infectious Diseases

## 2021-03-23 ENCOUNTER — Telehealth: Payer: Self-pay | Admitting: Family Medicine

## 2021-03-23 VITALS — BP 149/80 | HR 71 | Temp 97.9°F | Resp 16 | Ht 62.0 in | Wt 156.0 lb

## 2021-03-23 DIAGNOSIS — Z885 Allergy status to narcotic agent status: Secondary | ICD-10-CM | POA: Insufficient documentation

## 2021-03-23 DIAGNOSIS — Z882 Allergy status to sulfonamides status: Secondary | ICD-10-CM | POA: Diagnosis not present

## 2021-03-23 DIAGNOSIS — Z792 Long term (current) use of antibiotics: Secondary | ICD-10-CM | POA: Diagnosis not present

## 2021-03-23 DIAGNOSIS — A0471 Enterocolitis due to Clostridium difficile, recurrent: Secondary | ICD-10-CM | POA: Diagnosis not present

## 2021-03-23 DIAGNOSIS — M10371 Gout due to renal impairment, right ankle and foot: Secondary | ICD-10-CM

## 2021-03-23 DIAGNOSIS — Z87891 Personal history of nicotine dependence: Secondary | ICD-10-CM | POA: Insufficient documentation

## 2021-03-23 DIAGNOSIS — K521 Toxic gastroenteritis and colitis: Secondary | ICD-10-CM | POA: Diagnosis not present

## 2021-03-23 DIAGNOSIS — Z79891 Long term (current) use of opiate analgesic: Secondary | ICD-10-CM | POA: Insufficient documentation

## 2021-03-23 DIAGNOSIS — A0472 Enterocolitis due to Clostridium difficile, not specified as recurrent: Secondary | ICD-10-CM | POA: Diagnosis not present

## 2021-03-23 DIAGNOSIS — Z87440 Personal history of urinary (tract) infections: Secondary | ICD-10-CM | POA: Diagnosis not present

## 2021-03-23 DIAGNOSIS — Z79899 Other long term (current) drug therapy: Secondary | ICD-10-CM | POA: Insufficient documentation

## 2021-03-23 DIAGNOSIS — N390 Urinary tract infection, site not specified: Secondary | ICD-10-CM | POA: Diagnosis not present

## 2021-03-23 DIAGNOSIS — N951 Menopausal and female climacteric states: Secondary | ICD-10-CM | POA: Diagnosis not present

## 2021-03-23 DIAGNOSIS — E1122 Type 2 diabetes mellitus with diabetic chronic kidney disease: Secondary | ICD-10-CM | POA: Insufficient documentation

## 2021-03-23 DIAGNOSIS — M109 Gout, unspecified: Secondary | ICD-10-CM | POA: Insufficient documentation

## 2021-03-23 DIAGNOSIS — N958 Other specified menopausal and perimenopausal disorders: Secondary | ICD-10-CM | POA: Diagnosis not present

## 2021-03-23 DIAGNOSIS — Z881 Allergy status to other antibiotic agents status: Secondary | ICD-10-CM | POA: Diagnosis not present

## 2021-03-23 DIAGNOSIS — Z66 Do not resuscitate: Secondary | ICD-10-CM | POA: Diagnosis not present

## 2021-03-23 DIAGNOSIS — Z888 Allergy status to other drugs, medicaments and biological substances status: Secondary | ICD-10-CM | POA: Insufficient documentation

## 2021-03-23 DIAGNOSIS — T3695XA Adverse effect of unspecified systemic antibiotic, initial encounter: Secondary | ICD-10-CM | POA: Insufficient documentation

## 2021-03-23 DIAGNOSIS — Z96642 Presence of left artificial hip joint: Secondary | ICD-10-CM | POA: Insufficient documentation

## 2021-03-23 NOTE — Telephone Encounter (Signed)
-----   Message from Cloyd Stagers, RT sent at 03/08/2021  9:44 AM EDT ----- Regarding: Lab Orders for Friday 8.5.2022 Please place lab orders for Friday 8.5.2022, appt notes state "uric acid" Thank you, Dyke Maes RT(R)

## 2021-03-23 NOTE — Progress Notes (Signed)
NAME: Jill [name]  DOB: Dec 22, 1931  MRN: [id number]  Date/Time: 03/23/2021 12:02 PM   Subjective:  Patient patient is here with her granddaughter. ? Jill [name] is a 87 y.o. female with a history of diet-controlled diabetes, gout, recurrent C. difficile infection, being treated recurrent UTI is referred to me for the C. difficile infection. As per patient and granddaughter she has had some burning and increased frequency.  She was using Estrace in the past but had quit the past 2 years. She has been treated with multiple courses of antibiotics because of her complaint of dysuria.  This is led to her having C. difficile infection.  In June when the C. difficile was tested it was positive for toxin and PCR.  She completed a course of Dificid recently.  She says her stools are well formed now.  She does not have any diarrhea.  She has no abdominal pain.  She has got good appetite.  There is no nausea or vomiting. Past Medical History:  Diagnosis Date   Allergy    Arthritis    osteoarthritis    Chronic kidney disease    Complication of anesthesia    difficult waking    Depression    Diabetes mellitus    Diverticula, colon    Family history of anesthesia complication    Son is difficult intubation   Hyperlipidemia    Hypertension    Vasovagal syncope 2006   Negative cardiac workup-myoview, echo    Past Surgical History:  Procedure Laterality Date   ABDOMINAL HYSTERECTOMY  1977   fibroid   JOINT REPLACEMENT  2009   rt hip   TOTAL HIP ARTHROPLASTY Left 05/12/2014   dr Mayer Camel   TOTAL HIP ARTHROPLASTY Left 05/12/2014   Procedure: TOTAL HIP ARTHROPLASTY;  Surgeon: Kerin Salen, MD;  Location: [facility];  Service: Orthopedics;  Laterality: Left;    Social History   Socioeconomic History   Marital status: Widowed    Spouse name: Not on file   Number of children: Not on file   Years of education: Not on file   Highest education level: Not on file  Occupational History   Not on file   Tobacco Use   Smoking status: Former    Packs/day: 1.00    Years: 45.00    Pack years: 45.00    Types: Cigarettes    Quit date: 06/16/1999    Years since quitting: 21.7   Smokeless tobacco: Never   Tobacco comments:    quit 15 years ago  Substance and Sexual Activity   Alcohol use: No    Alcohol/week: 0.0 standard drinks   Drug use: No   Sexual activity: Not Currently  Other Topics Concern   Not on file  Social History Narrative   widowed; married for > 50 years; 4 pack year smoke - quit '02, no etoh; happy in marriage; 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 Diet: healthy diet      Son Antony Haste [place]) [place] , has living will and DNR ( reviewed 2015)               Social Determinants of Health   Financial Resource Strain: Not on file  Food Insecurity: Not on file  Transportation Needs: Not on file  Physical Activity: Not on file  Stress: Not on file  Social Connections: Not  on file  Intimate Partner Violence: Not on file    Family History  Problem Relation Age of Onset   COPD Brother    Heart disease Brother    Diabetes Brother    Lymphoma Brother    Alzheimer's disease Brother    Pancreatic cancer Sister    Alzheimer's disease Sister    Heart disease Mother    Breast cancer Sister    Leukemia Other    Kidney disease Neg Hx    Allergies  Allergen Reactions   Lovastatin Other (See Comments)    REACTION: leg pain   Statins Other (See Comments)    REACTION: leg cramps, weakness   Sulfa Antibiotics Hives and Itching   Cephalosporins    Codeine Rash   Niacin Rash    Flushing also   I? Current Outpatient Medications  Medication Sig Dispense Refill   acetaminophen (TYLENOL) 500 MG tablet Take 1,000 mg by mouth every 6 (six) hours as needed for moderate pain or headache.     allopurinol (ZYLOPRIM) 100 MG tablet Take 0.5  tablets (50 mg total) by mouth every other day. 15 tablet 11   cetirizine (ZYRTEC) 5 MG tablet Take 1 tablet (5 mg total) by mouth daily. 90 tablet 0   diclofenac Sodium (VOLTAREN) 1 % GEL Apply 2 g topically 4 (four) times daily as needed.     donepezil (ARICEPT) 5 MG tablet TAKE 1 TABLET BY MOUTH EVERYDAY AT BEDTIME 90 tablet 0   escitalopram (LEXAPRO) 20 MG tablet TAKE 1 TABLET BY MOUTH EVERY DAY 90 tablet 1   estradiol (ESTRACE VAGINAL) 0.1 MG/GM vaginal cream Apply 0.5mg  (pea-sized amount)  just inside the vaginal introitus with a finger-tip on Monday, Wednesday and Friday nights. 30 g 12   hydrOXYzine (ATARAX/VISTARIL) 10 MG tablet TAKE 0.5-1 TABLETS (5-10 MG TOTAL) BY MOUTH DAILY AS NEEDED FOR ANXIETY. 90 tablet 0   Multiple Vitamins-Minerals (MULTIVITAMIN WITH MINERALS) tablet Take 1 tablet by mouth daily.     Probiotic Product (PROBIOTIC DAILY PO) Take by mouth.     TART CHERRY PO Take 1 tablet by mouth in the morning and at bedtime.     tiotropium (SPIRIVA HANDIHALER) 18 MCG inhalation capsule INHALE 1 CAPSULE VIA HANDIHALER ONCE DAILY AT THE SAME TIME EVERY DAY 30 capsule 5   Vitamin D, Ergocalciferol, (DRISDOL) 1.25 MG (50000 UNIT) CAPS capsule Take 1 capsule (50,000 Units total) by mouth every Friday. 12 capsule 3   No current facility-administered medications for this visit.     Abtx:  Anti-infectives (From admission, onward)    None       REVIEW OF SYSTEMS:  Const: negative fever, negative chills, negative weight loss Eyes: negative diplopia or visual changes, negative eye pain ENT: negative coryza, negative sore throat Resp: negative cough, hemoptysis, dyspnea Cards: negative for chest pain, palpitations, lower extremity edema GU: negative for frequency, dysuria and hematuria GI: Negative for abdominal pain, diarrhea resolved, bleeding, constipation Skin: negative for rash and pruritus Heme: negative for easy bruising and gum/nose bleeding MS: negative for myalgias,  arthralgias, back pain and muscle weakness Neurolo:negative for headaches, dizziness, vertigo, memory problems  Psych: negative for feelings of anxiety, depression  Endocrine: negative for thyroid, diabetes Allergy/Immunology-as above Ambulates with a cane. Objective:  VITALS:  BP (!) 149/80   Pulse 71   Temp 97.9 F (36.6 C)   Resp 16   Ht 5\' 2"  (1.575 m)   Wt 156 lb (70.8 kg)   SpO2 94%  BMI 28.53 kg/m  PHYSICAL EXAM:  General: Alert, cooperative, no distress, appears stated age.  In wheelchair. Head: Normocephalic, without obvious abnormality, atraumatic. Eyes: Conjunctivae clear, anicteric sclerae. Pupils are equal ENT did not examine because of mask Neck: Supple, symmetrical, no adenopathy, thyroid: non tender no carotid bruit and no JVD. Back: No CVA tenderness. Lungs: Clear to auscultation bilaterally. No Wheezing or Rhonchi. No rales. Heart: Regular rate and rhythm, no murmur, rub or gallop. Abdomen: Soft, non-tender,not distended. Bowel sounds normal. No masses Extremities: atraumatic, no cyanosis. No edema. No clubbing Skin: No rashes or lesions. Or bruising Lymph: Cervical, supraclavicular normal. Neurologic: Grossly non-focal Pertinent Labs Lab Results CBC    Component Value Date/Time   WBC 6.9 02/26/2021 1420   RBC 3.61 (L) 02/26/2021 1420   HGB 11.0 (L) 02/26/2021 1420   HGB 11.4 08/02/2018 1550   HCT 33.4 (L) 02/26/2021 1420   HCT 33.9 (L) 08/02/2018 1550   PLT 216.0 02/26/2021 1420   PLT 276 08/02/2018 1550   MCV 92.5 02/26/2021 1420   MCV 91 08/02/2018 1550   MCH 30.0 01/29/2021 0933   MCHC 32.8 02/26/2021 1420   RDW 15.8 (H) 02/26/2021 1420   RDW 13.3 08/02/2018 1550   LYMPHSABS 4.2 (H) 02/26/2021 1420   LYMPHSABS 3.3 (H) 08/02/2018 1550   MONOABS 0.8 02/26/2021 1420   EOSABS 0.2 02/26/2021 1420   EOSABS 0.2 08/02/2018 1550   BASOSABS 0.0 02/26/2021 1420   BASOSABS 0.1 08/02/2018 1550    CMP Latest Ref Rng & Units 02/26/2021 02/04/2021  01/29/2021  Glucose 70 - 99 mg/dL 227(H) 234(H) 192(H)  BUN 6 - 23 mg/dL 35(H) 25(H) 24(H)  Creatinine 0.40 - 1.20 mg/dL 1.79(H) 1.75(H) 1.82(H)  Sodium 135 - 145 mEq/L 140 137 134(L)  Potassium 3.5 - 5.1 mEq/L 4.8 5.0 3.7  Chloride 96 - 112 mEq/L 105 104 105  CO2 19 - 32 mEq/L 27 26 20(L)  Calcium 8.4 - 10.5 mg/dL 8.9 8.5 8.4(L)  Total Protein 6.0 - 8.3 g/dL 6.1 - 6.1(L)  Total Bilirubin 0.2 - 1.2 mg/dL 0.4 - 0.9  Alkaline Phos 39 - 117 U/L 96 - 109  AST 0 - 37 U/L 14 - 22  ALT 0 - 35 U/L 6 - 8     ? Impression/Recommendation ? Recurrent C. difficile infection because of recurrent use of antibiotics. Explained to her that she should not take antibiotics unless she has a systemic infection. If she has to take antibiotic she will have to go on empiric C. difficile treatment with p.o. vancomycin.  Patient is being treated for recurrent UTI.  She has genitourinary syndrome of menopause causing dysuria and frequency.  Recommend not testing urine for the above 2 symptoms only.  If she has fever or urinary retention or flank pain or need to undergo cystoscopy then urine culture can be done and antibiotics can be given. Otherwise do not routinely test her urine. She has to use Estrace cream topically 3 times a week She has to take a probiotic She has been given instructions on using Cetaphil to wash and also try cranberry tart juice. Printed material was given to her.  She and her daughter-in-law understood the recommendations and answered their questions. Will follow her as needed. ___________________________________________________  Note:  This document was prepared using Systems analyst and may include unintentional dictation errors.

## 2021-03-23 NOTE — Patient Instructions (Addendum)
You are referred to me for recurrent cdiff infection- you jut completed dificid and you dont have any diarrhea now- You have been taking antibiotics for UTI . Your symptoms of burning, frequency is from Genito urinary syndrome of menopause. You need to avoid routine urine test and also avoid antibiotics for simple dysuria or frequency.    *Estrace /Premarin cream topically- peasized apply topically three times a week * Cetaphil to clean the genital area ( not soap)  * Cranberry supplement (-Knudsen cranberry concentrate- 1 ounce mixed with 8 ounces of water *wash with water after bowel movt *Probiotic for vaginal health( can try Pearls vaginal health) * Increase water consumption- 8 glasses a day *Ask your doctors not to check your urine on a routine basis  *Avoid antibiotics unless systemic infection/ or before cystoscopy * Kegel Exercise to strengthen pelvic floor   You are at risk for recurrent cdiff and the next time you take antibiotic you need to be treated empirically for Cdiff as well with Po  vancomycin

## 2021-03-26 ENCOUNTER — Other Ambulatory Visit: Payer: Self-pay

## 2021-03-26 ENCOUNTER — Other Ambulatory Visit (INDEPENDENT_AMBULATORY_CARE_PROVIDER_SITE_OTHER): Payer: Medicare PPO

## 2021-03-26 DIAGNOSIS — M10371 Gout due to renal impairment, right ankle and foot: Secondary | ICD-10-CM

## 2021-03-26 LAB — URIC ACID: Uric Acid, Serum: 8.5 mg/dL — ABNORMAL HIGH (ref 2.4–7.0)

## 2021-03-30 NOTE — Progress Notes (Signed)
No critical labs need to be addressed urgently. We will discuss labs in detail at upcoming office visit.   

## 2021-04-02 ENCOUNTER — Other Ambulatory Visit: Payer: Self-pay

## 2021-04-02 ENCOUNTER — Ambulatory Visit: Payer: Medicare PPO | Admitting: Family Medicine

## 2021-04-02 VITALS — BP 140/80 | HR 76 | Temp 97.8°F | Ht 62.0 in | Wt 158.2 lb

## 2021-04-02 DIAGNOSIS — S065XAA Traumatic subdural hemorrhage with loss of consciousness status unknown, initial encounter: Secondary | ICD-10-CM

## 2021-04-02 DIAGNOSIS — S065X9A Traumatic subdural hemorrhage with loss of consciousness of unspecified duration, initial encounter: Secondary | ICD-10-CM

## 2021-04-02 DIAGNOSIS — F0390 Unspecified dementia without behavioral disturbance: Secondary | ICD-10-CM | POA: Diagnosis not present

## 2021-04-02 DIAGNOSIS — F03A0 Unspecified dementia, mild, without behavioral disturbance, psychotic disturbance, mood disturbance, and anxiety: Secondary | ICD-10-CM

## 2021-04-02 DIAGNOSIS — M1A9XX0 Chronic gout, unspecified, without tophus (tophi): Secondary | ICD-10-CM

## 2021-04-02 DIAGNOSIS — A0471 Enterocolitis due to Clostridium difficile, recurrent: Secondary | ICD-10-CM

## 2021-04-02 MED ORDER — DONEPEZIL HCL 10 MG PO TABS: 10.0000 mg | ORAL_TABLET | Freq: Every day | ORAL | 11 refills | Status: DC

## 2021-04-02 NOTE — Assessment & Plan Note (Signed)
MMSE slightly worse from previous.. increase aricept to 10 mg daily as long as tolerated.

## 2021-04-02 NOTE — Patient Instructions (Signed)
If sleep remaining poor... let me know.  Limit hydroxyzine as able to avoid sedation.  Increase  aricept to 10 mg daily.  Continue allopurinol 50 mg every other day and low purine diet.

## 2021-04-02 NOTE — Assessment & Plan Note (Signed)
Improving uric acid on allopurinol 50 mg QOD ( given CKD)

## 2021-04-02 NOTE — Assessment & Plan Note (Signed)
Resolving, asymptomatic.

## 2021-04-02 NOTE — Assessment & Plan Note (Signed)
Avoid antibiotics.Marland Kitchen if has to be on antibiotics for UTI... start prophylactic Cdiff treatment.

## 2021-04-02 NOTE — Progress Notes (Signed)
Patient ID: Jill [name], female    DOB: 29-Sep-1931, 87 y.o.   MRN: [id number]  This visit was conducted in person.  BP 140/80   Pulse 76   Temp 97.8 F (36.6 C) (Temporal)   Ht 5\' 2"  (1.575 m)   Wt 158 lb 4 oz (71.8 kg)   SpO2 96%   BMI 28.94 kg/m    CC:  Chief Complaint  Patient presents with  . Follow-up    Uric acid   . Memory Loss   Subjective:   HPI: Jill [name] is a 87 y.o. female presenting on 04/02/2021 for Follow-up (Uric acid ) and Memory Loss   Memory Loss seems to gradually be worsening since admissions, mild dementia.. no SE on aricept 5 mg  Recent subdural hematoma  Gout, uric acid on 7/8 11.7  at last OV on 7/15.. she was started on low dose allopurinol 50 mg  QOD for gout prevention.  No  SE to allopurinol.  She was started on low purine diet  no chicken livers.   No gout flares in last month. Lab Results  Component Value Date   LABURIC 8.5 (H) 03/26/2021    Reviewed recent ID note and recs on recurrent UTI and recurrent Cdiff    Relevant past medical, surgical, family and social history reviewed and updated as indicated. Interim medical history since our last visit reviewed. Allergies and medications reviewed and updated. Outpatient Medications Prior to Visit  Medication Sig Dispense Refill  . acetaminophen (TYLENOL) 500 MG tablet Take 1,000 mg by mouth every 6 (six) hours as needed for moderate pain or headache.    . allopurinol (ZYLOPRIM) 100 MG tablet Take 0.5 tablets (50 mg total) by mouth every other day. 15 tablet 11  . cetirizine (ZYRTEC) 5 MG tablet Take 1 tablet (5 mg total) by mouth daily. 90 tablet 0  . Cranberry 500 MG CAPS Take by mouth.    . diclofenac Sodium (VOLTAREN) 1 % GEL Apply 2 g topically 4 (four) times daily as needed.    . donepezil (ARICEPT) 5 MG tablet TAKE 1 TABLET BY MOUTH EVERYDAY AT BEDTIME 90 tablet 0  . escitalopram (LEXAPRO) 20 MG tablet TAKE 1 TABLET BY MOUTH EVERY DAY 90 tablet 1  . estradiol (ESTRACE  VAGINAL) 0.1 MG/GM vaginal cream Apply 0.5mg  (pea-sized amount)  just inside the vaginal introitus with a finger-tip on Monday, Wednesday and Friday nights. 30 g 12  . hydrOXYzine (ATARAX/VISTARIL) 10 MG tablet TAKE 0.5-1 TABLETS (5-10 MG TOTAL) BY MOUTH DAILY AS NEEDED FOR ANXIETY. 90 tablet 0  . Multiple Vitamins-Minerals (MULTIVITAMIN WITH MINERALS) tablet Take 1 tablet by mouth daily.    . Probiotic Product (PROBIOTIC DAILY PO) Take by mouth.    Marland Kitchen TART CHERRY PO Take 1 tablet by mouth in the morning and at bedtime.    Marland Kitchen tiotropium (SPIRIVA HANDIHALER) 18 MCG inhalation capsule INHALE 1 CAPSULE VIA HANDIHALER ONCE DAILY AT THE SAME TIME EVERY DAY 30 capsule 5  . Vitamin D, Ergocalciferol, (DRISDOL) 1.25 MG (50000 UNIT) CAPS capsule Take 1 capsule (50,000 Units total) by mouth every Friday. 12 capsule 3   No facility-administered medications prior to visit.     Per HPI unless specifically indicated in ROS section below Review of Systems  Constitutional:  Negative for fatigue and fever.  HENT:  Negative for ear pain.   Eyes:  Negative for pain.  Respiratory:  Negative for chest tightness and shortness of breath.   Cardiovascular:  Negative for chest pain, palpitations and leg swelling.  Gastrointestinal:  Negative for abdominal pain.  Genitourinary:  Negative for dysuria.  Objective:  BP 140/80   Pulse 76   Temp 97.8 F (36.6 C) (Temporal)   Ht 5\' 2"  (1.575 m)   Wt 158 lb 4 oz (71.8 kg)   SpO2 96%   BMI 28.94 kg/m   Wt Readings from Last 3 Encounters:  04/02/21 158 lb 4 oz (71.8 kg)  03/23/21 156 lb (70.8 kg)  03/05/21 156 lb (70.8 kg)      Physical Exam Constitutional:      General: She is not in acute distress.    Appearance: Normal appearance. She is well-developed. She is not ill-appearing or toxic-appearing.     Comments: Elderly in wheelchair  HENT:     Head: Normocephalic.     Right Ear: Hearing, tympanic membrane, ear canal and external ear normal. Tympanic  membrane is not erythematous, retracted or bulging.     Left Ear: Hearing, tympanic membrane, ear canal and external ear normal. Tympanic membrane is not erythematous, retracted or bulging.     Nose: No mucosal edema or rhinorrhea.     Right Sinus: No maxillary sinus tenderness or frontal sinus tenderness.     Left Sinus: No maxillary sinus tenderness or frontal sinus tenderness.     Mouth/Throat:     Pharynx: Uvula midline.  Eyes:     General: Lids are normal. Lids are everted, no foreign bodies appreciated.     Conjunctiva/sclera: Conjunctivae normal.     Pupils: Pupils are equal, round, and reactive to light.  Neck:     Thyroid: No thyroid mass or thyromegaly.     Vascular: No carotid bruit.     Trachea: Trachea normal.  Cardiovascular:     Rate and Rhythm: Normal rate and regular rhythm.     Pulses: Normal pulses.     Heart sounds: Normal heart sounds, S1 normal and S2 normal. No murmur heard.   No friction rub. No gallop.  Pulmonary:     Effort: Pulmonary effort is normal. No tachypnea or respiratory distress.     Breath sounds: Normal breath sounds. No decreased breath sounds, wheezing, rhonchi or rales.  Abdominal:     General: Bowel sounds are normal.     Palpations: Abdomen is soft.     Tenderness: There is no abdominal tenderness.  Musculoskeletal:     Cervical back: Normal range of motion and neck supple.  Skin:    General: Skin is warm and dry.     Findings: No rash.  Neurological:     Mental Status: She is alert.     Comments: MMSE 26/30  Decreased short term recall and date  Psychiatric:        Mood and Affect: Mood is not anxious or depressed.        Speech: Speech normal.        Behavior: Behavior normal. Behavior is cooperative.        Thought Content: Thought content normal.        Judgment: Judgment normal.      Results for orders placed or performed in visit on 03/26/21  Uric acid  Result Value Ref Range   Uric Acid, Serum 8.5 (H) 2.4 - 7.0 mg/dL     This visit occurred during the SARS-CoV-2 public health emergency.  Safety protocols were in place, including screening questions prior to the visit, additional usage of staff PPE, and extensive cleaning of  exam room while observing appropriate contact time as indicated for disinfecting solutions.   COVID 19 screen:  No recent travel or known exposure to COVID19 The patient denies respiratory symptoms of COVID 19 at this time. The importance of social distancing was discussed today.   Assessment and Plan  Problem List Items Addressed This Visit     Chronic gout without tophus    Improving uric acid on allopurinol 50 mg QOD ( given CKD)      Mild dementia ([place]) - Primary     MMSE slightly worse from previous.. increase aricept to 10 mg daily as long as tolerated.      Relevant Medications   donepezil (ARICEPT) 10 MG tablet   Recurrent Clostridioides difficile diarrhea    Avoid antibiotics.Marland Kitchen if has to be on antibiotics for UTI... start prophylactic Cdiff treatment.      Subdural hematoma ([facility])     Resolving, asymptomatic.      Meds ordered this encounter  Medications  . donepezil (ARICEPT) 10 MG tablet    Sig: Take 1 tablet (10 mg total) by mouth at bedtime.    Dispense:  30 tablet    Refill:  [place], MD

## 2021-04-06 DIAGNOSIS — H5021 Vertical strabismus, right eye: Secondary | ICD-10-CM | POA: Diagnosis not present

## 2021-04-06 DIAGNOSIS — H532 Diplopia: Secondary | ICD-10-CM | POA: Diagnosis not present

## 2021-04-06 DIAGNOSIS — H35373 Puckering of macula, bilateral: Secondary | ICD-10-CM | POA: Diagnosis not present

## 2021-04-06 DIAGNOSIS — H353132 Nonexudative age-related macular degeneration, bilateral, intermediate dry stage: Secondary | ICD-10-CM | POA: Diagnosis not present

## 2021-04-06 DIAGNOSIS — Z961 Presence of intraocular lens: Secondary | ICD-10-CM | POA: Diagnosis not present

## 2021-04-06 LAB — HM DIABETES EYE EXAM

## 2021-04-07 DIAGNOSIS — R296 Repeated falls: Secondary | ICD-10-CM | POA: Insufficient documentation

## 2021-04-07 DIAGNOSIS — F418 Other specified anxiety disorders: Secondary | ICD-10-CM | POA: Insufficient documentation

## 2021-04-07 DIAGNOSIS — S329XXA Fracture of unspecified parts of lumbosacral spine and pelvis, initial encounter for closed fracture: Secondary | ICD-10-CM | POA: Insufficient documentation

## 2021-04-07 NOTE — Assessment & Plan Note (Signed)
Pelvic fracture followed by Ortho..minimal pain... not requiring tramadol. occ using tylenol.

## 2021-04-07 NOTE — Assessment & Plan Note (Signed)
Scheduled for follow up with neurosurgery for repeat head CT in 1 month.  No worsening of symptoms at this time

## 2021-04-07 NOTE — Assessment & Plan Note (Signed)
Currently not symptomatic.. no treatment needed.  Limit antibiotics as much as able given Cdiff history.

## 2021-04-07 NOTE — Assessment & Plan Note (Signed)
Can use hydroxyzine low dose for panic attack, anxiety spell.... if oversedating stop.

## 2021-04-07 NOTE — Assessment & Plan Note (Addendum)
Reviewed medication , will limit sedating medications.

## 2021-04-12 MED ORDER — VANCOMYCIN HCL 125 MG PO CAPS: 125.0000 mg | ORAL_CAPSULE | Freq: Four times a day (QID) | ORAL | 1 refills | Status: DC

## 2021-04-13 ENCOUNTER — Telehealth: Payer: Self-pay

## 2021-04-13 ENCOUNTER — Emergency Department
Admission: EM | Admit: 2021-04-13 | Discharge: 2021-04-13 | Disposition: A | Discharge status: Home / Self Care | Payer: Medicare PPO | Attending: Emergency Medicine | Admitting: Emergency Medicine

## 2021-04-13 ENCOUNTER — Other Ambulatory Visit: Payer: Self-pay

## 2021-04-13 DIAGNOSIS — R197 Diarrhea, unspecified: Secondary | ICD-10-CM | POA: Diagnosis not present

## 2021-04-13 DIAGNOSIS — Z5321 Procedure and treatment not carried out due to patient leaving prior to being seen by health care provider: Secondary | ICD-10-CM | POA: Diagnosis not present

## 2021-04-13 LAB — COMPREHENSIVE METABOLIC PANEL
ALT: 8 U/L (ref 0–44)
AST: 16 U/L (ref 15–41)
Albumin: 3.3 g/dL — ABNORMAL LOW (ref 3.5–5.0)
Alkaline Phosphatase: 74 U/L (ref 38–126)
Anion gap: 11 (ref 5–15)
BUN: 26 mg/dL — ABNORMAL HIGH (ref 8–23)
CO2: 23 mmol/L (ref 22–32)
Calcium: 8.6 mg/dL — ABNORMAL LOW (ref 8.9–10.3)
Chloride: 103 mmol/L (ref 98–111)
Creatinine, Ser: 1.87 mg/dL — ABNORMAL HIGH (ref 0.44–1.00)
GFR, Estimated: 26 mL/min — ABNORMAL LOW (ref >60)
Glucose, Bld: 213 mg/dL — ABNORMAL HIGH (ref 70–99)
Potassium: 3.8 mmol/L (ref 3.5–5.1)
Sodium: 137 mmol/L (ref 135–145)
Total Bilirubin: 0.7 mg/dL (ref 0.3–1.2)
Total Protein: 6.1 g/dL — ABNORMAL LOW (ref 6.5–8.1)

## 2021-04-13 LAB — CBC
HCT: 34.7 % — ABNORMAL LOW (ref 36.0–46.0)
Hemoglobin: 11.4 g/dL — ABNORMAL LOW (ref 12.0–15.0)
MCH: 30.9 pg (ref 26.0–34.0)
MCHC: 32.9 g/dL (ref 30.0–36.0)
MCV: 94 fL (ref 80.0–100.0)
Platelets: 280 10*3/uL (ref 150–400)
RBC: 3.69 MIL/uL — ABNORMAL LOW (ref 3.87–5.11)
RDW: 13.8 % (ref 11.5–15.5)
WBC: 13 10*3/uL — ABNORMAL HIGH (ref 4.0–10.5)
nRBC: 0 % (ref 0.0–0.2)

## 2021-04-13 LAB — LIPASE, BLOOD: Lipase: 21 U/L (ref 11–51)

## 2021-04-13 NOTE — Telephone Encounter (Signed)
Daughter called stating patient having 100-101 fevers over last few days and very loose back to back stools. Per DR Delaine Lame, I have advised that patient be seen in ED. Daughter [name] understands and will take her today.

## 2021-04-13 NOTE — ED Triage Notes (Signed)
Arrives with daughter for ED evaluation of diarrhea x 1 week.  Daughter states patient has history of C-Diff  Patient is AAOx3.  Skin warm and dry. NAD

## 2021-04-16 ENCOUNTER — Encounter (HOSPITAL_COMMUNITY): Payer: Self-pay

## 2021-04-16 ENCOUNTER — Other Ambulatory Visit: Payer: Self-pay

## 2021-04-16 ENCOUNTER — Telehealth: Payer: Self-pay

## 2021-04-16 ENCOUNTER — Ambulatory Visit (HOSPITAL_COMMUNITY)
Admission: RE | Admit: 2021-04-16 | Discharge: 2021-04-16 | Disposition: A | Discharge status: Home / Self Care | Payer: Medicare PPO | Source: Ambulatory Visit | Attending: Student | Admitting: Student

## 2021-04-16 VITALS — BP 165/72 | HR 69 | Temp 97.9°F | Resp 18

## 2021-04-16 DIAGNOSIS — A0472 Enterocolitis due to Clostridium difficile, not specified as recurrent: Secondary | ICD-10-CM | POA: Diagnosis not present

## 2021-04-16 LAB — CBC
HCT: 35.8 % — ABNORMAL LOW (ref 36.0–46.0)
Hemoglobin: 11.5 g/dL — ABNORMAL LOW (ref 12.0–15.0)
MCH: 30.5 pg (ref 26.0–34.0)
MCHC: 32.1 g/dL (ref 30.0–36.0)
MCV: 95 fL (ref 80.0–100.0)
Platelets: 293 10*3/uL (ref 150–400)
RBC: 3.77 MIL/uL — ABNORMAL LOW (ref 3.87–5.11)
RDW: 14.1 % (ref 11.5–15.5)
WBC: 10.5 10*3/uL (ref 4.0–10.5)
nRBC: 0 % (ref 0.0–0.2)

## 2021-04-16 NOTE — Telephone Encounter (Signed)
Received call today from patient's granddaughter Jill [name]; stating that TRUE is still having fevers, feeling week, and loose stool. Was not seen by a provider on 8/23 due to patient leaving AMA. Last fever was 100.7 yesterday evening.  Spoke with Jill [name] who states Jill [name] is feeling better. Fever has broke, but is still having episodes of loose stool. Advised that patient still go to ED today per Dr.Ravinshankar. They verbalized understanding and will call with additional concerns. Leatrice Jewels, RMA

## 2021-04-16 NOTE — ED Provider Notes (Signed)
[place]    CSN: [id number] Arrival date & time: 04/16/21  1738      History   Chief Complaint Chief Complaint  Patient presents with   Diarrhea   Weakness    HPI Jill [name] is a 87 y.o. female presenting with weakness related to active C dif infection. Medical history c dif, this is her fourth recurrence. She has already been started on vancomycin x14 days starting 8/22 by infectious disease. Here today with son.  They state that she has had decreased appetite for the last few days, and has not been eating and drinking.  States that she finally ate last night, and this is actually making her feel a lot better.  She is feeling fairly well today, but infectious disease asked her to come get "checked out".  Currently denies weakness, abdominal pain, nausea and vomiting, chest pain, dizziness.  Notes 1 episode of diarrhea today.  HPI  Past Medical History:  Diagnosis Date   Allergy    Arthritis    osteoarthritis    Chronic kidney disease    Complication of anesthesia    difficult waking    Depression    Diabetes mellitus    Diverticula, colon    Family history of anesthesia complication    Son is difficult intubation   Hyperlipidemia    Hypertension    Vasovagal syncope 2006   Negative cardiac workup-myoview, echo    Patient Active Problem List   Diagnosis Date Noted   Pelvic fracture ([place]) 04/07/2021   Frequent falls 04/07/2021   Situational anxiety 04/07/2021   Chronic gout without tophus 04/02/2021   Subdural hematoma ([place]) 01/19/2021   Recurrent Clostridioides difficile diarrhea 11/14/2020   Acute pain of left knee 09/29/2020   Mild dementia ([place]) 07/03/2020   Acute pain of right shoulder 07/03/2020   Left foot pain 04/09/2020   Bilateral leg weakness 05/21/2019   Pulmonary nodule 05/29/2018   Urinary urgency 02/03/2017   Bilateral buttock pain 02/02/2017   Gout involving toe of right foot 12/23/2016   Candidal intertrigo 10/28/2016    Stage 2 moderate COPD by GOLD classification ([place]) 09/13/2016   Type 2 diabetes mellitus with stage 3 chronic kidney disease, without long-term current use of insulin ([place]) 08/08/2016   Chronic diastolic congestive heart failure ([place]) 08/08/2016   Intracranial bleed ([facility])    BPPV (benign paroxysmal positional vertigo) 06/24/2016   Accidental fall 06/24/2016   Chronic neck pain 03/18/2016   Neuropathy due to type 2 diabetes mellitus ([place]) 03/18/2016   Anemia in chronic kidney disease 12/15/2015   Incomplete bladder emptying 10/15/2015   Abscess, renal/perirenal 10/15/2015   Recurrent UTI 10/15/2015   Lumbar back pain with radiculopathy affecting left lower extremity 05/05/2015   Lesion of left native kidney 01/28/2015   Chronic fatigue 01/22/2015   Chronic idiopathic urticaria 01/17/2015   S/P hip replacement 05/16/2014   DNR (do not resuscitate) 02/20/2014   ALLERGIC RHINITIS 12/17/2008   Vitamin D deficiency 07/01/2008   CKD (chronic kidney disease) stage 4, GFR 15-29 ml/min ([place]) 02/11/2008   DIVERTICULOSIS OF COLON 02/01/2008   COLONIC POLYPS, ADENOMATOUS 10/19/2006   Type 2 diabetes mellitus with diabetic neuropathy, unspecified ([place]) 10/19/2006   Hyperlipidemia associated with type 2 diabetes mellitus ([place]) 10/19/2006   OBESITY, NOS 10/19/2006   Major depression, recurrent ([place]) 10/19/2006   Hypertension associated with diabetes ([place]) 10/19/2006   Recurrent urticaria 10/19/2006    Past Surgical History:  Procedure Laterality Date   ABDOMINAL HYSTERECTOMY  1977   fibroid   JOINT REPLACEMENT  2009   rt hip   TOTAL HIP ARTHROPLASTY Left 05/12/2014   dr Mayer Camel   TOTAL HIP ARTHROPLASTY Left 05/12/2014   Procedure: TOTAL HIP ARTHROPLASTY;  Surgeon: Kerin Salen, MD;  Location: [facility];  Service: Orthopedics;  Laterality: Left;    OB History   No obstetric history on file.      Home Medications    Prior to Admission medications   Medication Sig Start Date End Date  Taking? Authorizing Provider  acetaminophen (TYLENOL) 500 MG tablet Take 1,000 mg by mouth every 6 (six) hours as needed for moderate pain or headache.    [provider]  allopurinol (ZYLOPRIM) 100 MG tablet Take 0.5 tablets (50 mg total) by mouth every other day. 03/05/21   Bedsole, Amy E, MD  cetirizine (ZYRTEC) 5 MG tablet Take 1 tablet (5 mg total) by mouth daily. 09/12/20   Jaynee Eagles, PA-C  Cranberry 500 MG CAPS Take by mouth.    [provider]  diclofenac Sodium (VOLTAREN) 1 % GEL Apply 2 g topically 4 (four) times daily as needed.    [provider]  donepezil (ARICEPT) 10 MG tablet Take 1 tablet (10 mg total) by mouth at bedtime. 04/02/21   Bedsole, Amy E, MD  escitalopram (LEXAPRO) 20 MG tablet TAKE 1 TABLET BY MOUTH EVERY DAY 10/23/20   Diona Browner, Amy E, MD  estradiol (ESTRACE VAGINAL) 0.1 MG/GM vaginal cream Apply 0.5mg  (pea-sized amount)  just inside the vaginal introitus with a finger-tip on Monday, Wednesday and Friday nights. 02/16/21   Zara Council A, PA-C  hydrOXYzine (ATARAX/VISTARIL) 10 MG tablet TAKE 0.5-1 TABLETS (5-10 MG TOTAL) BY MOUTH DAILY AS NEEDED FOR ANXIETY. 02/10/21   Bedsole, Amy E, MD  Multiple Vitamins-Minerals (MULTIVITAMIN WITH MINERALS) tablet Take 1 tablet by mouth daily.    [provider]  Probiotic Product (PROBIOTIC DAILY PO) Take by mouth.    [provider]  TART CHERRY PO Take 1 tablet by mouth in the morning and at bedtime.    [provider]  tiotropium (SPIRIVA HANDIHALER) 18 MCG inhalation capsule INHALE 1 CAPSULE VIA HANDIHALER ONCE DAILY AT THE SAME TIME EVERY DAY 09/16/20   Bedsole, Amy E, MD  vancomycin (VANCOCIN) 125 MG capsule Take 1 capsule (125 mg total) by mouth 4 (four) times daily. 04/12/21   Tsosie Billing, MD  Vitamin D, Ergocalciferol, (DRISDOL) 1.25 MG (50000 UNIT) CAPS capsule Take 1 capsule (50,000 Units total) by mouth every Friday. 03/05/21   Jinny Sanders, MD    Family  History Family History  Problem Relation Age of Onset   COPD Brother    Heart disease Brother    Diabetes Brother    Lymphoma Brother    Alzheimer's disease Brother    Pancreatic cancer Sister    Alzheimer's disease Sister    Heart disease Mother    Breast cancer Sister    Leukemia Other    Kidney disease Neg Hx     Social History Social History   Tobacco Use   Smoking status: Former    Packs/day: 1.00    Years: 45.00    Pack years: 45.00    Types: Cigarettes    Quit date: 06/16/1999    Years since quitting: 21.8   Smokeless tobacco: Never   Tobacco comments:    quit 15 years ago  Substance Use Topics   Alcohol use: No    Alcohol/week: 0.0 standard drinks  Drug use: No     Allergies   Lovastatin, Statins, Sulfa antibiotics, Cephalosporins, Codeine, and Niacin   Review of Systems Review of Systems  Constitutional:  Positive for appetite change. Negative for chills, diaphoresis, fever and unexpected weight change.  HENT:  Negative for congestion, ear pain, sinus pressure, sinus pain, sneezing, sore throat and trouble swallowing.   Respiratory:  Negative for cough, chest tightness and shortness of breath.   Cardiovascular:  Negative for chest pain.  Gastrointestinal:  Positive for diarrhea. Negative for abdominal distention, abdominal pain, anal bleeding, blood in stool, constipation, nausea, rectal pain and vomiting.  Genitourinary:  Negative for dysuria, flank pain, frequency and urgency.  Musculoskeletal:  Negative for back pain and myalgias.  Neurological:  Negative for dizziness, light-headedness and headaches.  All other systems reviewed and are negative.   Physical Exam Triage Vital Signs ED Triage Vitals  Enc Vitals Group     BP 04/16/21 1811 (!) 165/72     Pulse Rate 04/16/21 1811 69     Resp 04/16/21 1811 18     Temp 04/16/21 1811 97.9 F (36.6 C)     Temp Source 04/16/21 1811 Oral     SpO2 04/16/21 1811 96 %     Weight --      Height --       Head Circumference --      Peak Flow --      Pain Score 04/16/21 1809 0     Pain Loc --      Pain Edu? --      Excl. in [place]? --    No data found.  Updated Vital Signs BP (!) 165/72 (BP Location: Right Arm)   Pulse 69   Temp 97.9 F (36.6 C) (Oral)   Resp 18   SpO2 96%   Visual Acuity Right Eye Distance:   Left Eye Distance:   Bilateral Distance:    Right Eye Near:   Left Eye Near:    Bilateral Near:     Physical Exam Vitals reviewed.  Constitutional:      General: She is not in acute distress.    Appearance: Normal appearance. She is not ill-appearing.  HENT:     Head: Normocephalic and atraumatic.     Mouth/Throat:     Mouth: Mucous membranes are moist.     Comments: Moist mucous membranes Eyes:     Extraocular Movements: Extraocular movements intact.     Pupils: Pupils are equal, round, and reactive to light.  Cardiovascular:     Rate and Rhythm: Normal rate and regular rhythm.     Heart sounds: Normal heart sounds.  Pulmonary:     Effort: Pulmonary effort is normal.     Breath sounds: Normal breath sounds. No wheezing, rhonchi or rales.  Abdominal:     General: Bowel sounds are normal. There is no distension.     Palpations: Abdomen is soft. There is no mass.     Tenderness: There is no abdominal tenderness. There is no right CVA tenderness, left CVA tenderness, guarding or rebound.  Skin:    General: Skin is warm.     Capillary Refill: Capillary refill takes less than 2 seconds.     Comments: Good skin turgor  Neurological:     General: No focal deficit present.     Mental Status: She is alert and oriented to person, place, and time.  Psychiatric:        Mood and Affect: Mood normal.  Behavior: Behavior normal.     UC Treatments / Results  Labs (all labs ordered are listed, but only abnormal results are displayed) Labs Reviewed  CBC    EKG   Radiology No results found.  Procedures Procedures (including critical care  time)  Medications Ordered in UC Medications - No data to display  Initial Impression / Assessment and Plan / UC Course  I have reviewed the triage vital signs and the nursing notes.  Pertinent labs & imaging results that were available during my care of the patient were reviewed by me and considered in my medical decision making (see chart for details).     This patient is a very pleasant 87 y.o. year old female presenting with c dif and decreased appetite.  She is already followed by infectious disease, and is on day 4 of vancomycin for this.  Afebrile, nontachycardic.  Reassuring exam today, appears fairly well-hydrated.  We will check another CBC.  Encouraged good hydration by mouth, and follow-up if symptoms worsen or persist.  Strict ED return precautions discussed, patient and son verbalized understanding and agreement..   Final Clinical Impressions(s) / UC Diagnoses   Final diagnoses:  C. difficile colitis     Discharge Instructions      -Please drink plenty of fluids!! Water, decaf tea, etc.  -Seek additional medical attention if your symptoms are getting worse, like weakness, nausea and vomiting, worsening diarrhea, etc.     ED Prescriptions   None    PDMP not reviewed this encounter.   Hazel Sams, PA-C 04/16/21 1842

## 2021-04-16 NOTE — ED Triage Notes (Signed)
Pt presents with reoccurring diarrhea, fever, and weakness.

## 2021-04-16 NOTE — Discharge Instructions (Addendum)
-  Please drink plenty of fluids!! Water, decaf tea, etc.  -Seek additional medical attention if your symptoms are getting worse, like weakness, nausea and vomiting, worsening diarrhea, etc.

## 2021-04-17 ENCOUNTER — Telehealth: Payer: Self-pay

## 2021-04-21 NOTE — Telephone Encounter (Signed)
Unable to reach to set up AWV.

## 2021-04-30 ENCOUNTER — Other Ambulatory Visit: Payer: Self-pay | Admitting: Family Medicine

## 2021-05-01 ENCOUNTER — Other Ambulatory Visit: Payer: Self-pay | Admitting: Family Medicine

## 2021-05-04 NOTE — Assessment & Plan Note (Signed)
Stable control on aricept 5 mg daily.

## 2021-05-04 NOTE — Assessment & Plan Note (Signed)
Followed by  neurosurgery.. Stable CT head 02/26/2021.. no further treatment.

## 2021-05-04 NOTE — Assessment & Plan Note (Signed)
followed by ORTHO, minimal pain.

## 2021-05-04 NOTE — Assessment & Plan Note (Signed)
Not at goal with DM but given age...tolerated. Statins cause myalgia.

## 2021-05-04 NOTE — Assessment & Plan Note (Signed)
Stable, chronic.  Continue current medication.    

## 2021-05-04 NOTE — Assessment & Plan Note (Signed)
Continue estrogen cream.

## 2021-05-04 NOTE — Assessment & Plan Note (Signed)
Chronic, stable Euvolemic in office today

## 2021-05-04 NOTE — Assessment & Plan Note (Signed)
Refer to ID for consideration of further treatment given 2 episodes of Cdiff. In last 3 months.  Avoid antibiotic use unless absolutely necessary.

## 2021-05-04 NOTE — Assessment & Plan Note (Signed)
Start low renal dose of allopurinol. Low uric acid diet. Follow up uric acid levels.

## 2021-05-07 ENCOUNTER — Other Ambulatory Visit: Payer: Self-pay | Admitting: Infectious Diseases

## 2021-05-07 MED ORDER — VANCOMYCIN HCL 125 MG PO CAPS: 125.0000 mg | ORAL_CAPSULE | Freq: Four times a day (QID) | ORAL | 0 refills | Status: DC

## 2021-05-07 NOTE — Progress Notes (Signed)
Spoke to granddaughter- because of recurrence of diarrhea and h/o cdiff will do a 8 week taper of vancomycin- if no response to this she will need fecal transplant She has not taken any antibiotics for UTI in the past 6 weeks

## 2021-05-18 NOTE — Progress Notes (Signed)
05/19/2021 4:48 PM   Jill [name] [birth year] [id number]  Referring provider: Jinny Sanders, MD [address] [place],  [place] [address]   Chief Complaint  Patient presents with   Follow-up     Urological history: 1. Renal cysts -non-contrast CT 2022 - Small BILATERAL renal cysts. Several small hyperdense lesions within RIGHT kidney question hyperdense cysts, unchanged -RUS pending  2. Renal abscess -MRI 2016 - resolved  3. rUTI's -contributing factors of age, vaginal atrophy, DM, C. Diff and incontinence -documented positive urine cultures over the last year  E. coli resistant to Cipro and trimethoprim/sulfa on Jan 19, 2021  Pansensitive E. coli on November 09, 2020  E. coli resistant to ampicillin, gentamicin and trimethoprim sulfa on September 12, 2020  4. Vaginal atrophy/urethral caruncle -Vaginal estrogen cream has been misplaced and in need of new prescription  HPI: Jill [name] is a 87 y.o. female who presents today for 3 month follow up after starting vaginal estrogen cream.   She has been using the vaginal estrogen cream as she remembers.  Patient denies any modifying or aggravating factors.  Patient denies any gross hematuria, dysuria or suprapubic/flank pain.  Patient denies any fevers, chills, nausea or vomiting.     PMH: Past Medical History:  Diagnosis Date   Allergy    Arthritis    osteoarthritis    Chronic kidney disease    Complication of anesthesia    difficult waking    Depression    Diabetes mellitus    Diverticula, colon    Family history of anesthesia complication    Son is difficult intubation   Hyperlipidemia    Hypertension    Vasovagal syncope 2006   Negative cardiac workup-myoview, echo    Surgical History: Past Surgical History:  Procedure Laterality Date   ABDOMINAL HYSTERECTOMY  1977   fibroid   JOINT REPLACEMENT  2009   rt hip   TOTAL HIP ARTHROPLASTY Left 05/12/2014   dr Mayer Camel   TOTAL HIP ARTHROPLASTY Left  05/12/2014   Procedure: TOTAL HIP ARTHROPLASTY;  Surgeon: Kerin Salen, MD;  Location: [facility];  Service: Orthopedics;  Laterality: Left;    Home Medications:  Allergies as of 05/19/2021       Reactions   Lovastatin Other (See Comments)   REACTION: leg pain   Statins Other (See Comments)   REACTION: leg cramps, weakness   Sulfa Antibiotics Hives, Itching   Cephalosporins    Codeine Rash   Niacin Rash   Flushing also        Medication List        Accurate as of May 19, 2021 11:59 PM. If you have any questions, ask your nurse or doctor.          acetaminophen 500 MG tablet Commonly known as: TYLENOL Take 1,000 mg by mouth every 6 (six) hours as needed for moderate pain or headache.   allopurinol 100 MG tablet Commonly known as: ZYLOPRIM Take 0.5 tablets (50 mg total) by mouth every other day.   cetirizine 5 MG tablet Commonly known as: ZYRTEC Take 1 tablet (5 mg total) by mouth daily.   Cranberry 500 MG Caps Take by mouth.   diclofenac Sodium 1 % Gel Commonly known as: VOLTAREN Apply 2 g topically 4 (four) times daily as needed.   donepezil 10 MG tablet Commonly known as: ARICEPT Take 1 tablet (10 mg total) by mouth at bedtime.   escitalopram 20 MG tablet Commonly known as:  LEXAPRO TAKE 1 TABLET BY MOUTH EVERY DAY   estradiol 0.1 MG/GM vaginal cream Commonly known as: ESTRACE VAGINAL Apply 0.5mg  (pea-sized amount)  just inside the vaginal introitus with a finger-tip on Monday, Wednesday and Friday nights.   hydrOXYzine 10 MG tablet Commonly known as: ATARAX/VISTARIL TAKE 0.5-1 TABLETS (5-10 MG TOTAL) BY MOUTH DAILY AS NEEDED FOR ANXIETY.   multivitamin with minerals tablet Take 1 tablet by mouth daily.   PROBIOTIC DAILY PO Take by mouth.   Spiriva HandiHaler 18 MCG inhalation capsule Generic drug: tiotropium INHALE 1 CAPSULE VIA HANDIHALER ONCE DAILY AT THE SAME TIME EVERY DAY   TART CHERRY PO Take 1 tablet by mouth in the morning and at  bedtime.   vancomycin 125 MG capsule Commonly known as: VANCOCIN Take 1 capsule (125 mg total) by mouth 4 (four) times daily.   Vitamin D (Ergocalciferol) 1.25 MG (50000 UNIT) Caps capsule Commonly known as: DRISDOL Take 1 capsule (50,000 Units total) by mouth every Friday.        Allergies:  Allergies  Allergen Reactions   Lovastatin Other (See Comments)    REACTION: leg pain   Statins Other (See Comments)    REACTION: leg cramps, weakness   Sulfa Antibiotics Hives and Itching   Cephalosporins    Codeine Rash   Niacin Rash    Flushing also    Family History: Family History  Problem Relation Age of Onset   COPD Brother    Heart disease Brother    Diabetes Brother    Lymphoma Brother    Alzheimer's disease Brother    Pancreatic cancer Sister    Alzheimer's disease Sister    Heart disease Mother    Breast cancer Sister    Leukemia Other    Kidney disease Neg Hx     Social History:  reports that she quit smoking about 21 years ago. Her smoking use included cigarettes. She has a 45.00 pack-year smoking history. She has never used smokeless tobacco. She reports that she does not drink alcohol and does not use drugs.  ROS: Pertinent ROS in HPI  Physical Exam: BP (!) 171/73   Pulse 72   Ht 5\' 2"  (1.575 m)   Wt 150 lb (68 kg)   BMI 27.44 kg/m   Constitutional:  Well nourished. Alert and oriented, No acute distress. HEENT: [place] AT, mask in place.  Trachea midline Cardiovascular: No clubbing, cyanosis, or edema. GU: No CVA tenderness.  No bladder fullness or masses.  Atrophic external genitalia, normal pubic hair distribution, no lesions.  Normal urethral meatus, no lesions, no prolapse, no discharge.   No urethral masses, tenderness and/or tenderness. No bladder fullness, tenderness or masses. Pale vagina mucosa, fair estrogen effect, no discharge, no lesions.  Anus and perineum are without rashes or lesions.     Neurologic: Grossly intact, no focal deficits, moving  all 4 extremities. Psychiatric: Normal mood and affect.    Laboratory Data: Component     Latest Ref Rng & Units 04/16/2021          WBC     4.0 - 10.5 K/uL 10.5  RBC     3.87 - 5.11 MIL/uL 3.77 (L)  Hemoglobin     12.0 - 15.0 g/dL 11.5 (L)  HCT     36.0 - 46.0 % 35.8 (L)  MCV     80.0 - 100.0 fL 95.0  MCH     26.0 - 34.0 pg 30.5  MCHC     30.0 -  36.0 g/dL 32.1  RDW     11.5 - 15.5 % 14.1  Platelets     150 - 400 K/uL 293  nRBC     0.0 - 0.2 % 0.0  Neutrophils     43.0 - 77.0 %   NEUT#     1.4 - 7.7 K/uL   Lymphocytes     12.0 - 46.0 %   Lymphocyte #     0.7 - 4.0 K/uL   Monocytes Relative     3.0 - 12.0 %   Monocyte #     0.1 - 1.0 K/uL   Eosinophil     0.0 - 5.0 %   Eosinophils Absolute     0.0 - 0.7 K/uL   Basophil     0.0 - 3.0 %   Basophils Absolute     0.0 - 0.1 K/uL   WBC Morphology        RBC Morphology        Smear Review        Immature Granulocytes     %   Abs Immature Granulocytes     0.00 - 0.07 K/uL   Promyelocytes Relative     %   Lymphs     Not Estab. %   Monocytes     Not Estab. %   Eos     Not Estab. %   Basos     Not Estab. %   Monocytes Absolute     0.1 - 0.9 x10E3/uL   EOS (ABSOLUTE)     0.0 - 0.4 x10E3/uL   Immature Grans (Abs)     0.0 - 0.1 x10E3/uL   MPV     7.5 - 12.5 fL   Absolute Monocytes     200 - 950 cells/uL   Total Lymphocyte     %   Sodium     135 - 145 mmol/L   Potassium     3.5 - 5.1 mmol/L   Chloride     98 - 111 mmol/L   BUN     8 - 23 mg/dL   Creatinine     0.44 - 1.00 mg/dL   Glucose     70 - 99 mg/dL   Calcium Ionized     1.15 - 1.40 mmol/L   TCO2     22 - 32 mmol/L   WBC, UA     0 - 5 /hpf   Epithelial Cells (non renal)     0 - 10 /hpf   Bacteria, UA     None seen/Few    Component     Latest Ref Rng & Units 04/13/2021          Sodium     135 - 145 mmol/L 137  Potassium     3.5 - 5.1 mmol/L 3.8  Chloride     98 - 111 mmol/L 103  CO2     22 - 32 mmol/L 23  Glucose      70 - 99 mg/dL 213 (H)  BUN     8 - 23 mg/dL 26 (H)  Creatinine     0.44 - 1.00 mg/dL 1.87 (H)  Calcium     8.9 - 10.3 mg/dL 8.6 (L)  Total Protein     6.5 - 8.1 g/dL 6.1 (L)  Albumin     3.5 - 5.0 g/dL 3.3 (L)  AST     15 - 41 U/L 16  ALT     0 -  44 U/L 8  Alkaline Phosphatase     38 - 126 U/L 74  Total Bilirubin     0.3 - 1.2 mg/dL 0.7  GFR, Estimated     >60 mL/min 26 (L)  Anion gap     5 - 15 11  I have reviewed the labs.    Pertinent Imaging: RUS pending     Assessment & Plan:    1. rUTI's -no UTI's since last visit -continue preventative strategies   2. Vaginal atrophy -continue to apply the vaginal estrogen cream three nights weekly  3. Bilateral renal cysts -RUS pending                                             Return for I will call patient with results.  These notes generated with voice recognition software. I apologize for typographical errors.  Zara Council, PA-C  [facility] [address]  [place], [place] [address] [phone]

## 2021-05-19 ENCOUNTER — Ambulatory Visit (INDEPENDENT_AMBULATORY_CARE_PROVIDER_SITE_OTHER): Payer: Medicare PPO | Admitting: Urology

## 2021-05-19 ENCOUNTER — Other Ambulatory Visit: Payer: Self-pay

## 2021-05-19 VITALS — BP 171/73 | HR 72 | Ht 62.0 in | Wt 150.0 lb

## 2021-05-19 DIAGNOSIS — N952 Postmenopausal atrophic vaginitis: Secondary | ICD-10-CM

## 2021-05-19 DIAGNOSIS — N281 Cyst of kidney, acquired: Secondary | ICD-10-CM | POA: Diagnosis not present

## 2021-05-27 DIAGNOSIS — N184 Chronic kidney disease, stage 4 (severe): Secondary | ICD-10-CM | POA: Diagnosis not present

## 2021-05-27 DIAGNOSIS — E1122 Type 2 diabetes mellitus with diabetic chronic kidney disease: Secondary | ICD-10-CM | POA: Diagnosis not present

## 2021-05-27 DIAGNOSIS — I10 Essential (primary) hypertension: Secondary | ICD-10-CM | POA: Diagnosis not present

## 2021-05-30 ENCOUNTER — Encounter: Payer: Self-pay | Admitting: Urology

## 2021-06-01 ENCOUNTER — Ambulatory Visit
Admission: RE | Admit: 2021-06-01 | Discharge: 2021-06-01 | Disposition: A | Discharge status: Home / Self Care | Payer: Medicare PPO | Source: Ambulatory Visit | Attending: Urology | Admitting: Urology

## 2021-06-01 ENCOUNTER — Other Ambulatory Visit: Payer: Self-pay

## 2021-06-01 DIAGNOSIS — N281 Cyst of kidney, acquired: Secondary | ICD-10-CM | POA: Insufficient documentation

## 2021-06-23 ENCOUNTER — Telehealth: Payer: Self-pay | Admitting: Family Medicine

## 2021-06-23 DIAGNOSIS — D631 Anemia in chronic kidney disease: Secondary | ICD-10-CM

## 2021-06-23 DIAGNOSIS — N184 Chronic kidney disease, stage 4 (severe): Secondary | ICD-10-CM

## 2021-06-23 DIAGNOSIS — E1140 Type 2 diabetes mellitus with diabetic neuropathy, unspecified: Secondary | ICD-10-CM

## 2021-06-23 DIAGNOSIS — M10371 Gout due to renal impairment, right ankle and foot: Secondary | ICD-10-CM

## 2021-06-23 DIAGNOSIS — E559 Vitamin D deficiency, unspecified: Secondary | ICD-10-CM

## 2021-06-23 NOTE — Telephone Encounter (Signed)
-----   Message from Ellamae Sia sent at 06/23/2021 12:16 PM EDT ----- Regarding: Lab orders for Friday, 11.11.22 Lab orders, thanks

## 2021-06-28 ENCOUNTER — Other Ambulatory Visit: Payer: Self-pay | Admitting: Family Medicine

## 2021-07-02 ENCOUNTER — Other Ambulatory Visit: Payer: Self-pay

## 2021-07-02 ENCOUNTER — Other Ambulatory Visit: Payer: Medicare PPO

## 2021-07-02 ENCOUNTER — Other Ambulatory Visit (INDEPENDENT_AMBULATORY_CARE_PROVIDER_SITE_OTHER): Payer: Medicare PPO

## 2021-07-02 DIAGNOSIS — E1140 Type 2 diabetes mellitus with diabetic neuropathy, unspecified: Secondary | ICD-10-CM | POA: Diagnosis not present

## 2021-07-02 LAB — COMPREHENSIVE METABOLIC PANEL
ALT: 8 U/L (ref 0–35)
AST: 14 U/L (ref 0–37)
Albumin: 3.7 g/dL (ref 3.5–5.2)
Alkaline Phosphatase: 89 U/L (ref 39–117)
BUN: 37 mg/dL — ABNORMAL HIGH (ref 6–23)
CO2: 26 mEq/L (ref 19–32)
Calcium: 8.8 mg/dL (ref 8.4–10.5)
Chloride: 106 mEq/L (ref 96–112)
Creatinine, Ser: 1.77 mg/dL — ABNORMAL HIGH (ref 0.40–1.20)
GFR: 25.55 mL/min — ABNORMAL LOW (ref >60.00)
Glucose, Bld: 245 mg/dL — ABNORMAL HIGH (ref 70–99)
Potassium: 4.8 mEq/L (ref 3.5–5.1)
Sodium: 140 mEq/L (ref 135–145)
Total Bilirubin: 0.4 mg/dL (ref 0.2–1.2)
Total Protein: 6.1 g/dL (ref 6.0–8.3)

## 2021-07-02 LAB — HEMOGLOBIN A1C: Hgb A1c MFr Bld: 6.8 % — ABNORMAL HIGH (ref 4.6–6.5)

## 2021-07-02 NOTE — Progress Notes (Signed)
No critical labs need to be addressed urgently. We will discuss labs in detail at upcoming office visit.   

## 2021-07-09 ENCOUNTER — Other Ambulatory Visit: Payer: Self-pay

## 2021-07-09 ENCOUNTER — Ambulatory Visit: Payer: Medicare PPO | Admitting: Family Medicine

## 2021-07-09 VITALS — BP 150/86 | HR 64 | Temp 97.0°F | Ht 62.0 in | Wt 161.1 lb

## 2021-07-09 DIAGNOSIS — M10371 Gout due to renal impairment, right ankle and foot: Secondary | ICD-10-CM | POA: Diagnosis not present

## 2021-07-09 DIAGNOSIS — E1140 Type 2 diabetes mellitus with diabetic neuropathy, unspecified: Secondary | ICD-10-CM

## 2021-07-09 DIAGNOSIS — Z794 Long term (current) use of insulin: Secondary | ICD-10-CM

## 2021-07-09 DIAGNOSIS — F03A0 Unspecified dementia, mild, without behavioral disturbance, psychotic disturbance, mood disturbance, and anxiety: Secondary | ICD-10-CM

## 2021-07-09 DIAGNOSIS — E1159 Type 2 diabetes mellitus with other circulatory complications: Secondary | ICD-10-CM

## 2021-07-09 DIAGNOSIS — I152 Hypertension secondary to endocrine disorders: Secondary | ICD-10-CM

## 2021-07-09 DIAGNOSIS — A0471 Enterocolitis due to Clostridium difficile, recurrent: Secondary | ICD-10-CM

## 2021-07-09 LAB — HM DIABETES FOOT EXAM

## 2021-07-09 NOTE — Assessment & Plan Note (Signed)
Resolved with vancomycin taper per ID.

## 2021-07-09 NOTE — Assessment & Plan Note (Signed)
Minimal flares despite CKD on low dose QOD allopurinol.

## 2021-07-09 NOTE — Assessment & Plan Note (Signed)
Tolerable for age.  Not currently on medication.

## 2021-07-09 NOTE — Assessment & Plan Note (Signed)
No SE to 10 mg Aricept.  Dementia fairly stable per  Grand-daughter.

## 2021-07-09 NOTE — Assessment & Plan Note (Signed)
Associated with neuropathy.  Stable, chronic.   Diet controlled.

## 2021-07-09 NOTE — Progress Notes (Signed)
Patient ID: Jill [name], female    DOB: 12-17-1931, 87 y.o.   MRN: [id number]  This visit was conducted in person.  BP (!) 150/86   Pulse 64   Temp (!) 97 F (36.1 C) (Temporal)   Ht 5\' 2"  (1.575 m)   Wt 161 lb 2 oz (73.1 kg)   SpO2 98%   BMI 29.47 kg/m    CC: Chief Complaint  Patient presents with   Diabetes   Gout    Subjective:   HPI: [name] is a 87 y.o. female presenting on 07/09/2021 for Diabetes and Gout   Diabetes:  Diet controlled. FBS elevated but A1C at goal. Lab Results  Component Value Date   HGBA1C 6.8 (H) 07/02/2021  Using medications without difficulties: Hypoglycemic episodes: Hyperglycemic episodes: Feet problems: no ulcers Blood Sugars averaging: not checking eye exam within last year: per pt uptodate    Gout:  On allopurinol 1/2 tab every other day, renal dose.  No gout flares in last   3 months.  Due to CKD. Lab Results  Component Value Date   LABURIC 8.5 (H) 03/26/2021     On vancomycin taper for Cdiffile 04/2021.. just now finishing.  No diarrhea currently.    Hypertension:   Tolerable for age.  Not currently on medication. BP Readings from Last 3 Encounters:  07/09/21 (!) 150/86  05/19/21 (!) 171/73  04/16/21 (!) 165/72  Using medication without problems or lightheadedness:  none Chest pain with exertion: none Edema: none Short of breath: stable Average home BPs:  140/85 Other issues:   Relevant past medical, surgical, family and social history reviewed and updated as indicated. Interim medical history since our last visit reviewed. Allergies and medications reviewed and updated. Outpatient Medications Prior to Visit  Medication Sig Dispense Refill   acetaminophen (TYLENOL) 500 MG tablet Take 1,000 mg by mouth every 6 (six) hours as needed for moderate pain or headache.     allopurinol (ZYLOPRIM) 100 MG tablet Take 0.5 tablets (50 mg total) by mouth every other day. 15 tablet 11   Cranberry 500 MG CAPS Take by  mouth.     diclofenac Sodium (VOLTAREN) 1 % GEL Apply 2 g topically 4 (four) times daily as needed.     donepezil (ARICEPT) 10 MG tablet Take 1 tablet (10 mg total) by mouth at bedtime. 30 tablet 11   escitalopram (LEXAPRO) 20 MG tablet TAKE 1 TABLET BY MOUTH EVERY DAY 90 tablet 1   estradiol (ESTRACE VAGINAL) 0.1 MG/GM vaginal cream Apply 0.5mg  (pea-sized amount)  just inside the vaginal introitus with a finger-tip on Monday, Wednesday and Friday nights. 30 g 12   hydrOXYzine (ATARAX/VISTARIL) 10 MG tablet TAKE 0.5-1 TABLETS (5-10 MG TOTAL) BY MOUTH DAILY AS NEEDED FOR ANXIETY. 90 tablet 0   loratadine (CLARITIN) 10 MG tablet Take by mouth.     Multiple Vitamins-Minerals (MULTIVITAMIN WITH MINERALS) tablet Take 1 tablet by mouth daily.     Probiotic Product (PROBIOTIC DAILY PO) Take by mouth.     TART CHERRY PO Take 1 tablet by mouth in the morning and at bedtime.     tiotropium (SPIRIVA HANDIHALER) 18 MCG inhalation capsule INHALE 1 CAPSULE VIA HANDIHALER ONCE DAILY AT THE SAME TIME EVERY DAY 30 capsule 5   vancomycin (VANCOCIN) 125 MG capsule Take 1 capsule (125 mg total) by mouth 4 (four) times daily. 150 capsule 0   Vitamin D, Ergocalciferol, (DRISDOL) 1.25 MG (50000 UNIT) CAPS capsule Take 1 capsule (  50,000 Units total) by mouth every Friday. 12 capsule 3   cetirizine (ZYRTEC) 5 MG tablet Take 1 tablet (5 mg total) by mouth daily. (Patient not taking: Reported on 05/19/2021) 90 tablet 0   No facility-administered medications prior to visit.     Per HPI unless specifically indicated in ROS section below Review of Systems  Constitutional:  Negative for fatigue and fever.  HENT:  Negative for congestion.   Eyes:  Negative for pain.  Respiratory:  Negative for cough and shortness of breath.   Cardiovascular:  Negative for chest pain, palpitations and leg swelling.  Gastrointestinal:  Negative for abdominal pain.  Genitourinary:  Negative for dysuria and vaginal bleeding.  Musculoskeletal:   Negative for back pain.  Neurological:  Negative for syncope, light-headedness and headaches.  Psychiatric/Behavioral:  Negative for dysphoric mood.   Objective:  BP (!) 150/86   Pulse 64   Temp (!) 97 F (36.1 C) (Temporal)   Ht 5\' 2"  (1.575 m)   Wt 161 lb 2 oz (73.1 kg)   SpO2 98%   BMI 29.47 kg/m   Wt Readings from Last 3 Encounters:  07/09/21 161 lb 2 oz (73.1 kg)  05/19/21 150 lb (68 kg)  04/13/21 158 lb 4 oz (71.8 kg)      Physical Exam Constitutional:      General: She is not in acute distress.    Appearance: Normal appearance. She is well-developed. She is not ill-appearing or toxic-appearing.  HENT:     Head: Normocephalic.     Right Ear: Hearing, tympanic membrane, ear canal and external ear normal. Tympanic membrane is not erythematous, retracted or bulging.     Left Ear: Hearing, tympanic membrane, ear canal and external ear normal. Tympanic membrane is not erythematous, retracted or bulging.     Nose: No mucosal edema or rhinorrhea.     Right Sinus: No maxillary sinus tenderness or frontal sinus tenderness.     Left Sinus: No maxillary sinus tenderness or frontal sinus tenderness.     Mouth/Throat:     Pharynx: Uvula midline.  Eyes:     General: Lids are normal. Lids are everted, no foreign bodies appreciated.     Conjunctiva/sclera: Conjunctivae normal.     Pupils: Pupils are equal, round, and reactive to light.  Neck:     Thyroid: No thyroid mass or thyromegaly.     Vascular: No carotid bruit.     Trachea: Trachea normal.  Cardiovascular:     Rate and Rhythm: Normal rate and regular rhythm.     Pulses: Normal pulses.     Heart sounds: Normal heart sounds, S1 normal and S2 normal. No murmur heard.   No friction rub. No gallop.  Pulmonary:     Effort: Pulmonary effort is normal. No tachypnea or respiratory distress.     Breath sounds: Normal breath sounds. No decreased breath sounds, wheezing, rhonchi or rales.  Abdominal:     General: Bowel sounds are  normal.     Palpations: Abdomen is soft.     Tenderness: There is no abdominal tenderness.  Musculoskeletal:     Cervical back: Normal range of motion and neck supple.  Skin:    General: Skin is warm and dry.     Findings: No rash.  Neurological:     Mental Status: She is alert.  Psychiatric:        Mood and Affect: Mood is not anxious or depressed.        Speech: Speech normal.  Behavior: Behavior normal. Behavior is cooperative.        Thought Content: Thought content normal.        Judgment: Judgment normal.    Diabetic foot exam: Normal inspection No skin breakdown No calluses  Normal DP pulses Normal sensation to light touch and monofilament Nails normal     Results for orders placed or performed in visit on 07/02/21  Comprehensive metabolic panel  Result Value Ref Range   Sodium 140 135 - 145 mEq/L   Potassium 4.8 3.5 - 5.1 mEq/L   Chloride 106 96 - 112 mEq/L   CO2 26 19 - 32 mEq/L   Glucose, Bld 245 (H) 70 - 99 mg/dL   BUN 37 (H) 6 - 23 mg/dL   Creatinine, Ser 1.77 (H) 0.40 - 1.20 mg/dL   Total Bilirubin 0.4 0.2 - 1.2 mg/dL   Alkaline Phosphatase 89 39 - 117 U/L   AST 14 0 - 37 U/L   ALT 8 0 - 35 U/L   Total Protein 6.1 6.0 - 8.3 g/dL   Albumin 3.7 3.5 - 5.2 g/dL   GFR 25.55 (L) >60.00 mL/min   Calcium 8.8 8.4 - 10.5 mg/dL  Hemoglobin A1c  Result Value Ref Range   Hgb A1c MFr Bld 6.8 (H) 4.6 - 6.5 %    This visit occurred during the SARS-CoV-2 public health emergency.  Safety protocols were in place, including screening questions prior to the visit, additional usage of staff PPE, and extensive cleaning of exam room while observing appropriate contact time as indicated for disinfecting solutions.   COVID 19 screen:  No recent travel or known exposure to COVID19 The patient denies respiratory symptoms of COVID 19 at this time. The importance of social distancing was discussed today.   Assessment and Plan    Problem List Items Addressed This Visit      Gout involving toe of right foot    Minimal flares despite CKD on low dose QOD allopurinol.      Hypertension associated with diabetes ([place])     Tolerable for age.  Not currently on medication.      Mild dementia     No SE to 10 mg Aricept.  Dementia fairly stable per  Grand-daughter.      Recurrent Clostridioides difficile diarrhea    Resolved with vancomycin taper per ID.      Type 2 diabetes mellitus with diabetic neuropathy, unspecified ([place]) - Primary    Associated with neuropathy.  Stable, chronic.   Diet controlled.            Eliezer Lofts, MD

## 2021-07-12 ENCOUNTER — Encounter: Payer: Self-pay | Admitting: *Deleted

## 2021-08-23 ENCOUNTER — Telehealth: Payer: Medicare PPO | Admitting: Physician Assistant

## 2021-08-23 DIAGNOSIS — Z20828 Contact with and (suspected) exposure to other viral communicable diseases: Secondary | ICD-10-CM

## 2021-08-23 DIAGNOSIS — R6889 Other general symptoms and signs: Secondary | ICD-10-CM

## 2021-08-23 MED ORDER — OSELTAMIVIR PHOSPHATE 6 MG/ML PO SUSR: 30.0000 mg | Freq: Every day | ORAL | 0 refills | Status: AC

## 2021-08-23 MED ORDER — OSELTAMIVIR PHOSPHATE 30 MG PO CAPS
30.0000 mg | ORAL_CAPSULE | Freq: Every day | ORAL | 0 refills | Status: DC
Start: 2021-08-23 — End: 2021-08-23

## 2021-08-23 NOTE — Progress Notes (Signed)
E visit for Flu like symptoms   We are sorry that you are not feeling well.  Here is how we plan to help! Based on what you have shared with me it looks like you may have a respiratory virus that may be influenza.  Influenza or the flu is   an infection caused by a respiratory virus. The flu virus is highly contagious and persons who did not receive their yearly flu vaccination may catch the flu from close contact.  We have anti-viral medications to treat the viruses that cause this infection. They are not a cure and only shorten the course of the infection. These prescriptions are most effective when they are given within the first 2 days of flu symptoms. Antiviral medication are indicated if you have a high risk of complications from the flu. You should  also consider an antiviral medication if you are in close contact with someone who is at risk. These medications can help patients avoid complications from the flu  but have side effects that you should know. Possible side effects from Tamiflu or oseltamivir include nausea, vomiting, diarrhea, dizziness, headaches, eye redness, sleep problems or other respiratory symptoms. You should not take Tamiflu if you have an allergy to oseltamivir or any to the ingredients in Tamiflu.  Based upon your symptoms and potential risk factors I have prescribed Oseltamivir (Tamiflu).  It has been sent to your designated pharmacy.  You will take one 30 mg capsule orally once day for the next 5 days based on the proper dose for your kidney function.  ANYONE WHO HAS FLU SYMPTOMS SHOULD: Stay home. The flu is highly contagious and going out or to work exposes others! Be sure to drink plenty of fluids. Water is fine as well as fruit juices, sodas and electrolyte beverages. You may want to stay away from caffeine or alcohol. If you are nauseated, try taking small sips of liquids. How do you know if you are getting enough fluid? Your urine should be a pale yellow or  almost colorless. Get rest. Taking a steamy shower or using a humidifier may help nasal congestion and ease sore throat pain. Using a saline nasal spray works much the same way. Cough drops, hard candies and sore throat lozenges may ease your cough. Line up a caregiver. Have someone check on you regularly.   GET HELP RIGHT AWAY IF: You cannot keep down liquids or your medications. You become short of breath Your fell like you are going to pass out or loose consciousness. Your symptoms persist after you have completed your treatment plan MAKE SURE YOU  Understand these instructions. Will watch your condition. Will get help right away if you are not doing well or get worse.  Your e-visit answers were reviewed by a board certified advanced clinical practitioner to complete your personal care plan.  Depending on the condition, your plan could have included both over the counter or prescription medications.  If there is a problem please reply  once you have received a response from your provider.  Your safety is important to [place].  If you have drug allergies check your prescription carefully.    You can use MyChart to ask questions about todays visit, request a non-urgent call back, or ask for a work or school excuse for 24 hours related to this e-Visit. If it has been greater than 24 hours you will need to follow up with your provider, or enter a new e-Visit to address those concerns.  You will get an e-mail in the next two days asking about your experience.  I hope that your e-visit has been valuable and will speed your recovery. Thank you for using e-visits.

## 2021-08-23 NOTE — Addendum Note (Signed)
Addended by: Brunetta Jeans on: 08/23/2021 03:51 PM   Modules accepted: Orders

## 2021-08-23 NOTE — Progress Notes (Signed)
I have spent 5 minutes in review of e-visit questionnaire, review and updating patient chart, medical decision making and response to patient.   Donevin Sainsbury Cody Theophil Thivierge, PA-C    

## 2021-08-26 ENCOUNTER — Other Ambulatory Visit: Payer: Self-pay

## 2021-08-26 ENCOUNTER — Emergency Department
Admission: EM | Admit: 2021-08-26 | Discharge: 2021-08-26 | Disposition: A | Discharge status: Home / Self Care | Payer: Medicare PPO | Source: Home / Self Care

## 2021-08-26 ENCOUNTER — Telehealth: Payer: Self-pay

## 2021-08-26 ENCOUNTER — Emergency Department (INDEPENDENT_AMBULATORY_CARE_PROVIDER_SITE_OTHER): Payer: Medicare PPO

## 2021-08-26 DIAGNOSIS — R0689 Other abnormalities of breathing: Secondary | ICD-10-CM | POA: Diagnosis not present

## 2021-08-26 DIAGNOSIS — R059 Cough, unspecified: Secondary | ICD-10-CM

## 2021-08-26 MED ORDER — DOXYCYCLINE HYCLATE 100 MG PO CAPS: 100.0000 mg | ORAL_CAPSULE | Freq: Two times a day (BID) | ORAL | 0 refills | Status: DC

## 2021-08-26 MED ORDER — BENZONATATE 200 MG PO CAPS: 200.0000 mg | ORAL_CAPSULE | Freq: Three times a day (TID) | ORAL | 0 refills | Status: AC | PRN

## 2021-08-26 MED ORDER — DIFICID 200 MG PO TABS: 200.0000 mg | ORAL_TABLET | Freq: Two times a day (BID) | ORAL | 0 refills | Status: DC

## 2021-08-26 NOTE — ED Triage Notes (Signed)
Pt c/o cough, congestion and chills since Tuesday. E visit on Tuesday, was given Tamiflu 30mg  QD. Granddaughter dx with flu A on Monday. Here today to have lungs listened to due to pt having shortness of breath. Taking mucinex prn.

## 2021-08-26 NOTE — Telephone Encounter (Signed)
Noted  

## 2021-08-26 NOTE — Discharge Instructions (Addendum)
Advised patient and granddaughter chest x-ray was negative for consolidation/pneumonia.  Advised patient/granddaughter to take medication as directed with food to completion.  Advised may use Tessalon Perles daily or as needed for cough.  Encouraged patient increase daily water intake while taking these medications.

## 2021-08-26 NOTE — ED Provider Notes (Signed)
[facility]    CSN: [id number] Arrival date & time: 08/26/21  1312      History   Chief Complaint Chief Complaint  Patient presents with   Nasal Congestion   Cough    HPI Jill [name] is a 88 y.o. female.   HPI 88 year old female presents with cough, congestion and chills since Tuesday night.  E-visit on Tuesday was given Tamiflu (very low dose 30 mg QD).  Granddaughter, accompanying patient today diagnosed with flu A on Monday.  Patient is here to have provider listen to her lungs due to having shortness of breath.  PMH significant for chronic diastolic heart failure and CKD stage IV  Past Medical History:  Diagnosis Date   Allergy    Arthritis    osteoarthritis    Chronic kidney disease    Complication of anesthesia    difficult waking    Depression    Diabetes mellitus    Diverticula, colon    Family history of anesthesia complication    Son is difficult intubation   Hyperlipidemia    Hypertension    Vasovagal syncope 2006   Negative cardiac workup-myoview, echo    Patient Active Problem List   Diagnosis Date Noted   Pelvic fracture ([place]) 04/07/2021   Frequent falls 04/07/2021   Situational anxiety 04/07/2021   Chronic gout without tophus 04/02/2021   Subdural hematoma 01/19/2021   Recurrent Clostridioides difficile diarrhea 11/14/2020   Acute pain of left knee 09/29/2020   Mild dementia 07/03/2020   Acute pain of right shoulder 07/03/2020   Left foot pain 04/09/2020   Bilateral leg weakness 05/21/2019   Pulmonary nodule 05/29/2018   Urinary urgency 02/03/2017   Bilateral buttock pain 02/02/2017   Gout involving toe of right foot 12/23/2016   Candidal intertrigo 10/28/2016   Stage 2 moderate COPD by GOLD classification ([place]) 09/13/2016   Type 2 diabetes mellitus with stage 3 chronic kidney disease, without long-term current use of insulin ([place]) 08/08/2016   Chronic diastolic congestive heart failure ([place]) 08/08/2016   Intracranial bleed  (HCC)    BPPV (benign paroxysmal positional vertigo) 06/24/2016   Accidental fall 06/24/2016   Chronic neck pain 03/18/2016   Neuropathy due to type 2 diabetes mellitus ([place]) 03/18/2016   Anemia in chronic kidney disease 12/15/2015   Incomplete bladder emptying 10/15/2015   Abscess, renal/perirenal 10/15/2015   Recurrent UTI 10/15/2015   Lumbar back pain with radiculopathy affecting left lower extremity 05/05/2015   Lesion of left native kidney 01/28/2015   Chronic fatigue 01/22/2015   Chronic idiopathic urticaria 01/17/2015   S/P hip replacement 05/16/2014   DNR (do not resuscitate) 02/20/2014   ALLERGIC RHINITIS 12/17/2008   Vitamin D deficiency 07/01/2008   CKD (chronic kidney disease) stage 4, GFR 15-29 ml/min ([place]) 02/11/2008   DIVERTICULOSIS OF COLON 02/01/2008   COLONIC POLYPS, ADENOMATOUS 10/19/2006   Type 2 diabetes mellitus with diabetic neuropathy, unspecified ([place]) 10/19/2006   Hyperlipidemia associated with type 2 diabetes mellitus ([place]) 10/19/2006   OBESITY, NOS 10/19/2006   Major depression, recurrent ([place]) 10/19/2006   Hypertension associated with diabetes ([place]) 10/19/2006   Recurrent urticaria 10/19/2006    Past Surgical History:  Procedure Laterality Date   ABDOMINAL HYSTERECTOMY  1977   fibroid   JOINT REPLACEMENT  2009   rt hip   TOTAL HIP ARTHROPLASTY Left 05/12/2014   dr Mayer Camel   TOTAL HIP ARTHROPLASTY Left 05/12/2014   Procedure: TOTAL HIP ARTHROPLASTY;  Surgeon: Kerin Salen, MD;  Location:  [facility];  Service: Orthopedics;  Laterality: Left;    OB History   No obstetric history on file.      Home Medications    Prior to Admission medications   Medication Sig Start Date End Date Taking? Authorizing Provider  benzonatate (TESSALON) 200 MG capsule Take 1 capsule (200 mg total) by mouth 3 (three) times daily as needed for up to 7 days for cough. 08/26/21 09/02/21 Yes Eliezer Lofts, FNP  doxycycline (VIBRAMYCIN) 100 MG capsule Take 1 capsule (100 mg  total) by mouth 2 (two) times daily for 10 days. 08/26/21 09/05/21 Yes Eliezer Lofts, FNP  fidaxomicin (DIFICID) 200 MG TABS tablet Take 1 tablet (200 mg total) by mouth 2 (two) times daily. 08/26/21  Yes Eliezer Lofts, FNP  acetaminophen (TYLENOL) 500 MG tablet Take 1,000 mg by mouth every 6 (six) hours as needed for moderate pain or headache.    [provider]  allopurinol (ZYLOPRIM) 100 MG tablet Take 0.5 tablets (50 mg total) by mouth every other day. 03/05/21   Jinny Sanders, MD  Cranberry 500 MG CAPS Take by mouth.    [provider]  diclofenac Sodium (VOLTAREN) 1 % GEL Apply 2 g topically 4 (four) times daily as needed.    [provider]  donepezil (ARICEPT) 10 MG tablet Take 1 tablet (10 mg total) by mouth at bedtime. 04/02/21   Bedsole, Amy E, MD  escitalopram (LEXAPRO) 20 MG tablet TAKE 1 TABLET BY MOUTH EVERY DAY 04/30/21   Diona Browner, Amy E, MD  estradiol (ESTRACE VAGINAL) 0.1 MG/GM vaginal cream Apply 0.5mg  (pea-sized amount)  just inside the vaginal introitus with a finger-tip on Monday, Wednesday and Friday nights. 02/16/21   Zara Council A, PA-C  hydrOXYzine (ATARAX/VISTARIL) 10 MG tablet TAKE 0.5-1 TABLETS (5-10 MG TOTAL) BY MOUTH DAILY AS NEEDED FOR ANXIETY. 02/10/21   Bedsole, Amy E, MD  loratadine (CLARITIN) 10 MG tablet Take by mouth.    [provider]  Multiple Vitamins-Minerals (MULTIVITAMIN WITH MINERALS) tablet Take 1 tablet by mouth daily.    [provider]  oseltamivir (TAMIFLU) 6 MG/ML SUSR suspension Take 5 mLs (30 mg total) by mouth daily for 5 days. 08/23/21 08/28/21  Brunetta Jeans, PA-C  Probiotic Product (PROBIOTIC DAILY PO) Take by mouth.    [provider]  TART CHERRY PO Take 1 tablet by mouth in the morning and at bedtime.    [provider]  tiotropium (SPIRIVA HANDIHALER) 18 MCG inhalation capsule INHALE 1 CAPSULE VIA HANDIHALER ONCE DAILY AT THE SAME TIME EVERY DAY 09/16/20   Bedsole, Amy E, MD   vancomycin (VANCOCIN) 125 MG capsule Take 1 capsule (125 mg total) by mouth 4 (four) times daily. 05/07/21   Tsosie Billing, MD  Vitamin D, Ergocalciferol, (DRISDOL) 1.25 MG (50000 UNIT) CAPS capsule Take 1 capsule (50,000 Units total) by mouth every Friday. 03/05/21   Jinny Sanders, MD    Family History Family History  Problem Relation Age of Onset   COPD Brother    Heart disease Brother    Diabetes Brother    Lymphoma Brother    Alzheimer's disease Brother    Pancreatic cancer Sister    Alzheimer's disease Sister    Heart disease Mother    Breast cancer Sister    Leukemia Other    Kidney disease Neg Hx     Social History Social History   Tobacco Use   Smoking status: Former    Packs/day: 1.00  Years: 45.00    Pack years: 45.00    Types: Cigarettes    Quit date: 06/16/1999    Years since quitting: 22.2   Smokeless tobacco: Never   Tobacco comments:    quit 15 years ago  Substance Use Topics   Alcohol use: No    Alcohol/week: 0.0 standard drinks   Drug use: No     Allergies   Lovastatin, Statins, Sulfa antibiotics, Cephalosporins, Codeine, and Niacin   Review of Systems Review of Systems  HENT:  Positive for congestion.   Respiratory:  Positive for cough and shortness of breath.   All other systems reviewed and are negative.   Physical Exam Triage Vital Signs ED Triage Vitals  Enc Vitals Group     BP 08/26/21 1333 111/73     Pulse Rate 08/26/21 1333 91     Resp 08/26/21 1333 17     Temp 08/26/21 1333 98 F (36.7 C)     Temp Source 08/26/21 1333 Oral     SpO2 08/26/21 1333 93 %     Weight 08/26/21 1334 155 lb (70.3 kg)     Height --      Head Circumference --      Peak Flow --      Pain Score 08/26/21 1333 0     Pain Loc --      Pain Edu? --      Excl. in [place]? --    No data found.  Updated Vital Signs BP 111/73 (BP Location: Right Arm)    Pulse 91    Temp 98 F (36.7 C) (Oral)    Resp 17    Wt 155 lb (70.3 kg)    SpO2 93%    BMI  28.35 kg/m       Physical Exam Vitals and nursing note reviewed.  Constitutional:      General: She is not in acute distress.    Appearance: Normal appearance. She is normal weight. She is not ill-appearing.  HENT:     Right Ear: Tympanic membrane, ear canal and external ear normal.     Left Ear: Tympanic membrane, ear canal and external ear normal.     Mouth/Throat:     Mouth: Mucous membranes are moist.     Pharynx: Oropharynx is clear.  Eyes:     Extraocular Movements: Extraocular movements intact.     Conjunctiva/sclera: Conjunctivae normal.     Pupils: Pupils are equal, round, and reactive to light.  Cardiovascular:     Rate and Rhythm: Normal rate and regular rhythm.     Pulses: Normal pulses.     Heart sounds: Normal heart sounds.  Pulmonary:     Effort: Pulmonary effort is normal. No respiratory distress.     Breath sounds: No wheezing or rales.     Comments: Diffuse scattered rhonchi noted throughout, diminished breath sounds bibasilarly Musculoskeletal:     Cervical back: Normal range of motion and neck supple.  Skin:    General: Skin is warm and dry.  Neurological:     General: No focal deficit present.     Mental Status: She is alert and oriented to person, place, and time.     UC Treatments / Results  Labs (all labs ordered are listed, but only abnormal results are displayed) Labs Reviewed - No data to display  EKG   Radiology DG Chest 2 View  Result Date: 08/26/2021 CLINICAL DATA:  Cough EXAM: CHEST - 2 VIEW COMPARISON:  Chest x-ray  01/08/2021 FINDINGS: Heart size is normal. Mediastinum appears stable. Lungs are hyperinflated no focal consolidation identified. Mild biapical pleural thickening. No pleural effusion or pneumothorax. IMPRESSION: No focal consolidation identified. Electronically Signed   By: Ofilia Neas M.D.   On: 08/26/2021 14:52    Procedures Procedures (including critical care time)  Medications Ordered in UC Medications - No  data to display  Initial Impression / Assessment and Plan / UC Course  I have reviewed the triage vital signs and the nursing notes.  Pertinent labs & imaging results that were available during my care of the patient were reviewed by me and considered in my medical decision making (see chart for details).     MDM: 1.  Adventitious breath sounds, CXR revealed negative for focal consolidation (above); 2.  Cough-Rx'd Doxycycline, Dificid, and Tessalon Perles. Advised patient and granddaughter chest x-ray was negative for consolidation/pneumonia.  Advised patient/granddaughter to take medication as directed with food to completion.  Advised may use Tessalon Perles daily or as needed for cough.  Encouraged patient increase daily water intake while taking these medications.  Patient discharged home, hemodynamically stable. Final Clinical Impressions(s) / UC Diagnoses   Final diagnoses:  Adventitious breath sounds  Cough, unspecified type     Discharge Instructions      Advised patient and granddaughter chest x-ray was negative for consolidation/pneumonia.  Advised patient/granddaughter to take medication as directed with food to completion.  Advised may use Tessalon Perles daily or as needed for cough.  Encouraged patient increase daily water intake while taking these medications.     ED Prescriptions     Medication Sig Dispense Auth. Provider   fidaxomicin (DIFICID) 200 MG TABS tablet Take 1 tablet (200 mg total) by mouth 2 (two) times daily. 20 tablet Eliezer Lofts, FNP   doxycycline (VIBRAMYCIN) 100 MG capsule Take 1 capsule (100 mg total) by mouth 2 (two) times daily for 10 days. 20 capsule Eliezer Lofts, FNP   benzonatate (TESSALON) 200 MG capsule Take 1 capsule (200 mg total) by mouth 3 (three) times daily as needed for up to 7 days for cough. 40 capsule Eliezer Lofts, FNP      PDMP not reviewed this encounter.   Eliezer Lofts, [place] 08/26/21 [phone]

## 2021-08-26 NOTE — Telephone Encounter (Signed)
Per chart review tab pt is at [facility]. Sending note to DR Diona Browner and Butch Penny CMA.

## 2021-08-26 NOTE — Telephone Encounter (Signed)
[facility] - Client TELEPHONE ADVICE RECORD [facility] Patient Name: Jill [name] Gender: Female DOB: 09-04-1931 Age: 88 Y 31 M 18 D Return Phone Number: [phone] (Primary), [phone] (Secondary) Address: City/ State/ Zip: [place] [address] Client [facility] - Client Client Site [place] - Day Provider Eliezer Lofts - MD Contact Type Call Who Is Calling Patient / Member / Family / Caregiver Call Type Triage / Clinical Caller Name Kristen Relationship To Patient Grandchild Return Phone Number [phone] (Secondary) Chief Complaint BREATHING - shortness of breath or sounds breathless Reason for Call Symptomatic / Request for Jill [name] states that she is concerned about her grandmother's breathing. She has the flu. She rattles when she breathes. She has COPD. Her O2 is 92. [place] Not Listed UC in Jill [name] by hospital Translation No Nurse Assessment Nurse: [place], RN, Estill Bamberg Date/Time (Eastern Time): 08/26/2021 12:02:28 PM Confirm and document reason for call. If symptomatic, describe symptoms. ---caller states she has the flu and her grandmother has the same symptoms. today is day 3 of tamiflu. chest sounds bad. oxygen 92% and usually stays 97-98% Does the patient have any new or worsening symptoms? ---Yes Will a triage be completed? ---Yes Related visit to physician within the last 2 weeks? ---Yes Does the PT have any chronic conditions? (i.e. diabetes, asthma, this includes High risk factors for pregnancy, etc.) ---Yes List chronic conditions. ---copd on spiriva, CHF, CKD Is this a behavioral health or substance abuse call? ---No Guidelines Guideline Title Affirmed Question Affirmed Notes Nurse Date/Time (Eastern Time) Influenza Follow-up Call [1] Difficulty breathing AND [2] not severe AND [3] not from stuffy nose (e.g., not  relieved by cleaning out the nose) Humfleet, RN, Estill Bamberg 08/26/2021 12:03:11 PM PLEASE NOTE: All timestamps contained within this report are represented as [place] Standard Time. CONFIDENTIALTY NOTICE: This fax transmission is intended only for the addressee. It contains information that is legally privileged, confidential or otherwise protected from use or disclosure. If you are not the intended recipient, you are strictly prohibited from reviewing, disclosing, copying using or disseminating any of this information or taking any action in reliance on or regarding this information. If you have received this fax in error, please notify us immediately by telephone so that we can arrange for its return to [place]. Phone: [phone], Toll-Free: [phone], Fax: [phone] Page: 2 of 2 Call Id: [id number] [place]. Time [name] Time) Disposition Final User 08/26/2021 12:01:25 PM Send to Urgent Queue Windy Canny 08/26/2021 12:08:51 PM Go to ED Now Yes Hum[place]et, RN, Shelly Coss Disagree/Comply Comply Caller Understands Yes PreDisposition InappropriateToAsk Care Advice Given Per Guideline CARE ADVICE given per Influenza Follow-Up Call (Adult) guideline. GO TO ED NOW: * You need to be seen in the Emergency Department. * Leave now. Drive carefully. Comments User: [name], RN Date/Time [name] Time): 08/26/2021 12:05:29 PM temp 99 orally User: [name], RN Date/Time [name] Time): 08/26/2021 12:09:33 PM granddtr says patient may refuse the ER and if so she is going to take her to an [place]

## 2021-09-01 ENCOUNTER — Ambulatory Visit: Payer: Medicare PPO | Admitting: Nurse Practitioner

## 2021-09-01 ENCOUNTER — Other Ambulatory Visit: Payer: Self-pay

## 2021-09-01 ENCOUNTER — Emergency Department (HOSPITAL_COMMUNITY): Payer: Medicare PPO

## 2021-09-01 ENCOUNTER — Emergency Department (HOSPITAL_COMMUNITY)
Admission: EM | Admit: 2021-09-01 | Discharge: 2021-09-01 | Disposition: A | Discharge status: Home / Self Care | Payer: Medicare PPO | Attending: Emergency Medicine | Admitting: Emergency Medicine

## 2021-09-01 DIAGNOSIS — Z20822 Contact with and (suspected) exposure to covid-19: Secondary | ICD-10-CM | POA: Insufficient documentation

## 2021-09-01 DIAGNOSIS — Z563 Stressful work schedule: Secondary | ICD-10-CM | POA: Diagnosis not present

## 2021-09-01 DIAGNOSIS — J101 Influenza due to other identified influenza virus with other respiratory manifestations: Secondary | ICD-10-CM | POA: Insufficient documentation

## 2021-09-01 DIAGNOSIS — R059 Cough, unspecified: Secondary | ICD-10-CM | POA: Diagnosis not present

## 2021-09-01 DIAGNOSIS — R0602 Shortness of breath: Secondary | ICD-10-CM | POA: Diagnosis not present

## 2021-09-01 LAB — BASIC METABOLIC PANEL
Anion gap: 8 (ref 5–15)
BUN: 53 mg/dL — ABNORMAL HIGH (ref 8–23)
CO2: 23 mmol/L (ref 22–32)
Calcium: 9 mg/dL (ref 8.9–10.3)
Chloride: 102 mmol/L (ref 98–111)
Creatinine, Ser: 2.05 mg/dL — ABNORMAL HIGH (ref 0.44–1.00)
GFR, Estimated: 23 mL/min — ABNORMAL LOW (ref >60)
Glucose, Bld: 265 mg/dL — ABNORMAL HIGH (ref 70–99)
Potassium: 4.9 mmol/L (ref 3.5–5.1)
Sodium: 133 mmol/L — ABNORMAL LOW (ref 135–145)

## 2021-09-01 LAB — CBC
HCT: 39.6 % (ref 36.0–46.0)
Hemoglobin: 12.2 g/dL (ref 12.0–15.0)
MCH: 30.6 pg (ref 26.0–34.0)
MCHC: 30.8 g/dL (ref 30.0–36.0)
MCV: 99.2 fL (ref 80.0–100.0)
Platelets: 257 10*3/uL (ref 150–400)
RBC: 3.99 MIL/uL (ref 3.87–5.11)
RDW: 13.7 % (ref 11.5–15.5)
WBC: 11.7 10*3/uL — ABNORMAL HIGH (ref 4.0–10.5)
nRBC: 0 % (ref 0.0–0.2)

## 2021-09-01 LAB — RESP PANEL BY RT-PCR (FLU A&B, COVID) ARPGX2
Influenza A by PCR: POSITIVE — AB
Influenza B by PCR: NEGATIVE
SARS Coronavirus 2 by RT PCR: NEGATIVE

## 2021-09-01 NOTE — ED Notes (Signed)
Pt called for vitals. No response.  

## 2021-09-01 NOTE — ED Triage Notes (Signed)
Pt arrives with cough and sob x "several days." Seen at [facility] for same on 1/5 and prescribed doxycycline and tessalon, which she has been taking, without relief.

## 2021-09-01 NOTE — ED Notes (Signed)
Pt called for vitals recheck. No response.

## 2021-09-02 ENCOUNTER — Other Ambulatory Visit: Payer: Self-pay | Admitting: Nurse Practitioner

## 2021-09-02 ENCOUNTER — Encounter: Payer: Self-pay | Admitting: Nurse Practitioner

## 2021-09-02 ENCOUNTER — Ambulatory Visit: Payer: Medicare PPO | Admitting: Nurse Practitioner

## 2021-09-02 ENCOUNTER — Ambulatory Visit
Admission: RE | Admit: 2021-09-02 | Discharge: 2021-09-02 | Disposition: A | Discharge status: Home / Self Care | Payer: Medicare PPO | Source: Ambulatory Visit | Attending: Nurse Practitioner | Admitting: Nurse Practitioner

## 2021-09-02 VITALS — BP 130/64 | HR 99 | Temp 97.6°F | Resp 16 | Ht 62.0 in | Wt 157.1 lb

## 2021-09-02 DIAGNOSIS — J111 Influenza due to unidentified influenza virus with other respiratory manifestations: Secondary | ICD-10-CM

## 2021-09-02 DIAGNOSIS — J101 Influenza due to other identified influenza virus with other respiratory manifestations: Secondary | ICD-10-CM

## 2021-09-02 DIAGNOSIS — J189 Pneumonia, unspecified organism: Secondary | ICD-10-CM | POA: Diagnosis not present

## 2021-09-02 DIAGNOSIS — R051 Acute cough: Secondary | ICD-10-CM | POA: Insufficient documentation

## 2021-09-02 DIAGNOSIS — Z8709 Personal history of other diseases of the respiratory system: Secondary | ICD-10-CM

## 2021-09-02 DIAGNOSIS — R918 Other nonspecific abnormal finding of lung field: Secondary | ICD-10-CM | POA: Diagnosis not present

## 2021-09-02 DIAGNOSIS — Z8679 Personal history of other diseases of the circulatory system: Secondary | ICD-10-CM | POA: Diagnosis not present

## 2021-09-02 DIAGNOSIS — R0602 Shortness of breath: Secondary | ICD-10-CM | POA: Diagnosis not present

## 2021-09-02 DIAGNOSIS — J8410 Pulmonary fibrosis, unspecified: Secondary | ICD-10-CM | POA: Diagnosis not present

## 2021-09-02 DIAGNOSIS — R059 Cough, unspecified: Secondary | ICD-10-CM | POA: Insufficient documentation

## 2021-09-02 LAB — COMPREHENSIVE METABOLIC PANEL
ALT: 10 U/L (ref 0–35)
AST: 19 U/L (ref 0–37)
Albumin: 3.8 g/dL (ref 3.5–5.2)
Alkaline Phosphatase: 84 U/L (ref 39–117)
BUN: 54 mg/dL — ABNORMAL HIGH (ref 6–23)
CO2: 28 mEq/L (ref 19–32)
Calcium: 9.7 mg/dL (ref 8.4–10.5)
Chloride: 101 mEq/L (ref 96–112)
Creatinine, Ser: 1.92 mg/dL — ABNORMAL HIGH (ref 0.40–1.20)
GFR: 23.15 mL/min — ABNORMAL LOW (ref >60.00)
Glucose, Bld: 267 mg/dL — ABNORMAL HIGH (ref 70–99)
Potassium: 5.1 mEq/L (ref 3.5–5.1)
Sodium: 136 mEq/L (ref 135–145)
Total Bilirubin: 0.5 mg/dL (ref 0.2–1.2)
Total Protein: 6.9 g/dL (ref 6.0–8.3)

## 2021-09-02 LAB — CBC WITH DIFFERENTIAL/PLATELET
Basophils Absolute: 0.1 10*3/uL (ref 0.0–0.1)
Basophils Relative: 0.5 % (ref 0.0–3.0)
Eosinophils Absolute: 0.1 10*3/uL (ref 0.0–0.7)
Eosinophils Relative: 1.1 % (ref 0.0–5.0)
HCT: 37.9 % (ref 36.0–46.0)
Hemoglobin: 12.1 g/dL (ref 12.0–15.0)
Lymphocytes Relative: 57.5 % — ABNORMAL HIGH (ref 12.0–46.0)
Lymphs Abs: 6.2 10*3/uL — ABNORMAL HIGH (ref 0.7–4.0)
MCHC: 32 g/dL (ref 30.0–36.0)
MCV: 93.6 fl (ref 78.0–100.0)
Monocytes Absolute: 0.9 10*3/uL (ref 0.1–1.0)
Monocytes Relative: 8.4 % (ref 3.0–12.0)
Neutro Abs: 3.5 10*3/uL (ref 1.4–7.7)
Neutrophils Relative %: 32.5 % — ABNORMAL LOW (ref 43.0–77.0)
Platelets: 276 10*3/uL (ref 150.0–400.0)
RBC: 4.04 Mil/uL (ref 3.87–5.11)
RDW: 14.3 % (ref 11.5–15.5)
WBC: 10.8 10*3/uL — ABNORMAL HIGH (ref 4.0–10.5)

## 2021-09-02 LAB — BRAIN NATRIURETIC PEPTIDE: Pro B Natriuretic peptide (BNP): 107 pg/mL — ABNORMAL HIGH (ref 0.0–100.0)

## 2021-09-02 MED ORDER — ALBUTEROL SULFATE HFA 108 (90 BASE) MCG/ACT IN AERS: 2.0000 | INHALATION_SPRAY | Freq: Four times a day (QID) | RESPIRATORY_TRACT | 0 refills | Status: DC | PRN

## 2021-09-02 MED ORDER — AZITHROMYCIN 250 MG PO TABS: ORAL_TABLET | ORAL | 0 refills | Status: AC

## 2021-09-02 MED ORDER — PREDNISONE 20 MG PO TABS: 20.0000 mg | ORAL_TABLET | Freq: Two times a day (BID) | ORAL | 0 refills | Status: AC

## 2021-09-02 NOTE — Assessment & Plan Note (Signed)
History of COPD on Spiriva currently we will send in prednisone and albuterol inhaler.

## 2021-09-02 NOTE — Assessment & Plan Note (Signed)
Appropriate for influenza A.  Patient decompensating more since onset of illness.  Concerning for pneumonia and is not showing up on chest x-ray and not being responsive to doxycycline.  Pending CT chest without contrast

## 2021-09-02 NOTE — Assessment & Plan Note (Signed)
Since being diagnosed with influenza has progressed and gotten worse.  Multiple points of etiology.  Pending CT chest for further evaluation

## 2021-09-02 NOTE — Patient Instructions (Signed)
Nice to see you today Will be in touch in regards to the labs I am considering a CT of your chest. I am going to wait until I get some labs back first. If you continue to get worse you need to go to ED for further work up

## 2021-09-02 NOTE — Assessment & Plan Note (Signed)
Cough that has not improved and not responsive to antitussive therapies.  Patient does have history of COPD and CHF.  We will start patient on some prednisone and some albuterol inhaler for as needed use.  Pending labs and CT chest

## 2021-09-02 NOTE — Progress Notes (Signed)
Orders only.   Spoke with Lauren (inpatient pharmacist) will switch to azithromycin vs doxy. Too high risk for fluroqinolone

## 2021-09-02 NOTE — Assessment & Plan Note (Signed)
History of heart failure not followed by cardiology.  No overt edema today does have rales in lungs pending lab results

## 2021-09-02 NOTE — Progress Notes (Signed)
Acute Office Visit  Subjective:    Patient ID: Jill [name], female    DOB: [birth year], 88 y.o.   MRN: [id number]  Chief Complaint  Patient presents with   ER follow up/SOB    HPI Patient is in today for ED followup  Was around grand child that was dx with flu and patient stared showing symptoms on 08/24/2021. Was placed on tami flu on the same day, 30mg  daily. Did stop tami flu after seeing the urgent care provider on Thursday 08/26/2021. They placed her on on doxycycline, dificid and tessalon perals. Has not had improvement Patient states that she is short of breath while sitting and on exertion. SOme labored breathing noted in exam room  States she is urinating 2-3 times daily and it is real yellow. Having trouble eating and drinking. Not drinking water through out the day. Did review labs and showed increase in creatinine and BUN. Concern for AKI and Azotemia.   No history of blood clots. Was a former smoker  Past Medical History:  Diagnosis Date   Allergy    Arthritis    osteoarthritis    Chronic kidney disease    Complication of anesthesia    difficult waking    Depression    Diabetes mellitus    Diverticula, colon    Family history of anesthesia complication    Son is difficult intubation   Hyperlipidemia    Hypertension    Vasovagal syncope 2006   Negative cardiac workup-myoview, echo    Past Surgical History:  Procedure Laterality Date   ABDOMINAL HYSTERECTOMY  1977   fibroid   JOINT REPLACEMENT  2009   rt hip   TOTAL HIP ARTHROPLASTY Left 05/12/2014   dr Mayer Camel   TOTAL HIP ARTHROPLASTY Left 05/12/2014   Procedure: TOTAL HIP ARTHROPLASTY;  Surgeon: Kerin Salen, MD;  Location: [facility];  Service: Orthopedics;  Laterality: Left;    Family History  Problem Relation Age of Onset   COPD Brother    Heart disease Brother    Diabetes Brother    Lymphoma Brother    Alzheimer's disease Brother    Pancreatic cancer Sister    Alzheimer's disease Sister     Heart disease Mother    Breast cancer Sister    Leukemia Other    Kidney disease Neg Hx     Social History   Socioeconomic History   Marital status: Widowed    Spouse name: Not on file   Number of children: Not on file   Years of education: Not on file   Highest education level: Not on file  Occupational History   Not on file  Tobacco Use   Smoking status: Former    Packs/day: 1.00    Years: 45.00    Pack years: 45.00    Types: Cigarettes    Quit date: 06/16/1999    Years since quitting: 22.2   Smokeless tobacco: Never   Tobacco comments:    quit 15 years ago  Substance and Sexual Activity   Alcohol use: No    Alcohol/week: 0.0 standard drinks   Drug use: No   Sexual activity: Not Currently  Other Topics Concern   Not on file  Social History Narrative   widowed; married for > 50 years; 51 pack year smoke - quit '02, no etoh; happy in marriage; 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 Diet: healthy diet      Son Antony H[place]) [place] , has living will and DNR ( reviewed 2015)               Social Determinants of Health   Financial Resource Strain: Not on file  Food Insecurity: Not on file  Transportation Needs: Not on file  Physical Activity: Not on file  Stress: Not on file  Social Connections: Not on file  Intimate Partner Violence: Not on file    Outpatient Medications Prior to Visit  Medication Sig Dispense Refill   acetaminophen (TYLENOL) 500 MG tablet Take 1,000 mg by mouth every 6 (six) hours as needed for moderate pain or headache.     allopurinol (ZYLOPRIM) 100 MG tablet Take 0.5 tablets (50 mg total) by mouth every other day. 15 tablet 11   benzonatate (TESSALON) 200 MG capsule Take 1 capsule (200 mg total) by mouth 3 (three) times daily as needed for up to 7 days for cough. 40 capsule 0   Cranberry 500 MG CAPS Take by  mouth.     diclofenac Sodium (VOLTAREN) 1 % GEL Apply 2 g topically 4 (four) times daily as needed.     donepezil (ARICEPT) 10 MG tablet Take 1 tablet (10 mg total) by mouth at bedtime. 30 tablet 11   doxycycline (VIBRAMYCIN) 100 MG capsule Take 1 capsule (100 mg total) by mouth 2 (two) times daily for 10 days. 20 capsule 0   escitalopram (LEXAPRO) 20 MG tablet TAKE 1 TABLET BY MOUTH EVERY DAY 90 tablet 1   estradiol (ESTRACE VAGINAL) 0.1 MG/GM vaginal cream Apply 0.5mg  (pea-sized amount)  just inside the vaginal introitus with a finger-tip on Monday, Wednesday and Friday nights. 30 g 12   fidaxomicin (DIFICID) 200 MG TABS tablet Take 1 tablet (200 mg total) by mouth 2 (two) times daily. 20 tablet 0   hydrOXYzine (ATARAX/VISTARIL) 10 MG tablet TAKE 0.5-1 TABLETS (5-10 MG TOTAL) BY MOUTH DAILY AS NEEDED FOR ANXIETY. 90 tablet 0   loratadine (CLARITIN) 10 MG tablet Take by mouth.     Multiple Vitamins-Minerals (MULTIVITAMIN WITH MINERALS) tablet Take 1 tablet by mouth daily.     Probiotic Product (PROBIOTIC DAILY PO) Take by mouth.     TART CHERRY PO Take 1 tablet by mouth in the morning and at bedtime.     tiotropium (SPIRIVA HANDIHALER) 18 MCG inhalation capsule INHALE 1 CAPSULE VIA HANDIHALER ONCE DAILY AT THE SAME TIME EVERY DAY 30 capsule 5   vancomycin (VANCOCIN) 125 MG capsule Take 1 capsule (125 mg total) by mouth 4 (four) times daily. 150 capsule 0   Vitamin D, Ergocalciferol, (DRISDOL) 1.25 MG (50000 UNIT) CAPS capsule Take 1 capsule (50,000 Units total) by mouth every Friday. 12 capsule 3   No facility-administered medications prior to visit.    Allergies  Allergen Reactions   Lovastatin Other (See Comments)    REACTION: leg pain   Statins Other (See Comments)    REACTION: leg cramps, weakness   Sulfa Antibiotics Hives and Itching   Cephalosporins    Codeine Rash   Niacin Rash    Flushing also    Review of Systems  Constitutional:  Positive for fatigue. Negative for chills  and fever.  HENT:  Positive for congestion. Negative for ear discharge, ear pain and sore throat.   Respiratory:  Positive for cough (dry), chest tightness, shortness of breath and wheezing.   Cardiovascular:  Negative for chest pain.  Gastrointestinal:  Negative for abdominal pain, diarrhea, nausea and vomiting.       BM today   Genitourinary:  Negative for decreased urine volume.  Musculoskeletal:  Negative for arthralgias and myalgias.  Neurological:  Positive for weakness. Negative for dizziness, light-headedness and headaches.      Objective:    Physical Exam Vitals and nursing note reviewed.  Constitutional:      Appearance: Normal appearance.  HENT:     Right Ear: Tympanic membrane, ear canal and external ear normal.     Left Ear: Tympanic membrane, ear canal and external ear normal.     Mouth/Throat:     Mouth: Mucous membranes are moist.     Pharynx: Oropharynx is clear.  Cardiovascular:     Rate and Rhythm: Normal rate and regular rhythm.     Heart sounds: Normal heart sounds.  Pulmonary:     Breath sounds: Examination of the right-upper field reveals rales. Examination of the left-upper field reveals decreased breath sounds. Examination of the right-lower field reveals rales. Examination of the left-lower field reveals decreased breath sounds. Decreased breath sounds and rales present.     Comments: Labored breathing Musculoskeletal:     Right lower leg: No edema.     Left lower leg: No edema.  Lymphadenopathy:     Cervical: No cervical adenopathy.  Skin:    General: Skin is warm.  Neurological:     Mental Status: She is alert. Mental status is at baseline.    BP 130/64    Pulse 99    Temp 97.6 F (36.4 C)    Resp 16    Ht 5\' 2"  (1.575 m)    Wt 157 lb 2 oz (71.3 kg)    SpO2 94%    BMI 28.74 kg/m  Wt Readings from Last 3 Encounters:  09/02/21 157 lb 2 oz (71.3 kg)  08/26/21 155 lb (70.3 kg)  07/09/21 161 lb 2 oz (73.1 kg)    Health Maintenance Due  Topic  Date Due   Zoster Vaccines- Shingrix (1 of 2) Never done   URINE MICROALBUMIN  11/13/2020    There are no preventive care reminders to display for this patient.   Lab Results  Component Value Date   TSH 3.68 08/21/2019   Lab Results  Component Value Date   WBC 11.7 (H) 09/01/2021   HGB 12.2 09/01/2021   HCT 39.6 09/01/2021   MCV 99.2 09/01/2021   PLT 257 09/01/2021   Lab Results  Component Value Date   NA 133 (L) 09/01/2021   K 4.9 09/01/2021   CO2 23 09/01/2021   GLUCOSE 265 (H) 09/01/2021   BUN 53 (H) 09/01/2021   CREATININE 2.05 (H) 09/01/2021   BILITOT 0.4 07/02/2021   ALKPHOS 89 07/02/2021   AST 14 07/02/2021   ALT 8 07/02/2021   PROT 6.1 07/02/2021   ALBUMIN 3.7 07/02/2021   CALCIUM 9.0 09/01/2021   ANIONGAP 8 09/01/2021   GFR 25.55 (L) 07/02/2021   Lab Results  Component Value Date   CHOL 279 (H) 02/26/2021   Lab Results  Component Value Date   HDL 50.90 02/26/2021   Lab Results  Component Value Date   LDLCALC 117 (H) 08/08/2018   Lab Results  Component Value Date   TRIG 271.0 (H) 02/26/2021   Lab Results  Component Value Date   CHOLHDL 5 02/26/2021   Lab Results  Component Value Date   HGBA1C 6.8 (H) 07/02/2021  Assessment & Plan:   Problem List Items Addressed This Visit       Respiratory   Influenza A - Primary    Appropriate for influenza A.  Patient decompensating more since onset of illness.  Concerning for pneumonia and is not showing up on chest x-ray and not being responsive to doxycycline.  Pending CT chest without contrast      Relevant Orders   CT Chest Wo Contrast     Other   Acute cough    Cough that has not improved and not responsive to antitussive therapies.  Patient does have history of COPD and CHF.  We will start patient on some prednisone and some albuterol inhaler for as needed use.  Pending labs and CT chest      Relevant Medications   albuterol (VENTOLIN HFA) 108 (90 Base) MCG/ACT inhaler   Other  Relevant Orders   CT Chest Wo Contrast   History of heart failure    History of heart failure not followed by cardiology.  No overt edema today does have rales in lungs pending lab results      Relevant Orders   Brain natriuretic peptide   Shortness of breath    Since being diagnosed with influenza has progressed and gotten worse.  Multiple points of etiology.  Pending CT chest for further evaluation      Relevant Medications   predniSONE (DELTASONE) 20 MG tablet   albuterol (VENTOLIN HFA) 108 (90 Base) MCG/ACT inhaler   Other Relevant Orders   CBC with Differential/Platelet   Comprehensive metabolic panel   Brain natriuretic peptide   CT Chest Wo Contrast   History of COPD    History of COPD on Spiriva currently we will send in prednisone and albuterol inhaler.      Relevant Medications   predniSONE (DELTASONE) 20 MG tablet   Other Relevant Orders   CT Chest Wo Contrast     Meds ordered this encounter  Medications   predniSONE (DELTASONE) 20 MG tablet    Sig: Take 1 tablet (20 mg total) by mouth 2 (two) times daily with a meal for 5 days.    Dispense:  10 tablet    Refill:  0    Order Specific Question:   Supervising Provider    Answer:   Loura Pardon A [[id number]]   albuterol (VENTOLIN HFA) 108 (90 Base) MCG/ACT inhaler    Sig: Inhale 2 puffs into the lungs every 6 (six) hours as needed for wheezing or shortness of breath.    Dispense:  8 g    Refill:  0    Order Specific Question:   Supervising Provider    Answer:   Loura Pardon A [[id number]]   This visit occurred during the SARS-CoV-2 public health emergency.  Safety protocols were in place, including screening questions prior to the visit, additional usage of staff PPE, and extensive cleaning of exam room while observing appropriate contact time as indicated for disinfecting solutions.    Romilda Garret, NP

## 2021-09-03 ENCOUNTER — Other Ambulatory Visit: Payer: Medicare PPO

## 2021-09-08 ENCOUNTER — Other Ambulatory Visit: Payer: Self-pay

## 2021-09-08 ENCOUNTER — Telehealth: Payer: Self-pay | Admitting: Radiology

## 2021-09-08 ENCOUNTER — Other Ambulatory Visit (INDEPENDENT_AMBULATORY_CARE_PROVIDER_SITE_OTHER): Payer: Medicare PPO | Admitting: Nurse Practitioner

## 2021-09-08 ENCOUNTER — Other Ambulatory Visit (INDEPENDENT_AMBULATORY_CARE_PROVIDER_SITE_OTHER): Payer: Medicare PPO

## 2021-09-08 ENCOUNTER — Other Ambulatory Visit: Payer: Medicare PPO

## 2021-09-08 DIAGNOSIS — D72820 Lymphocytosis (symptomatic): Secondary | ICD-10-CM | POA: Diagnosis not present

## 2021-09-08 DIAGNOSIS — D72829 Elevated white blood cell count, unspecified: Secondary | ICD-10-CM

## 2021-09-08 DIAGNOSIS — R7989 Other specified abnormal findings of blood chemistry: Secondary | ICD-10-CM | POA: Diagnosis not present

## 2021-09-08 LAB — CBC WITH DIFFERENTIAL/PLATELET
Basophils Absolute: 0 10*3/uL (ref 0.0–0.1)
Basophils Relative: 0.1 % (ref 0.0–3.0)
Eosinophils Absolute: 0 10*3/uL (ref 0.0–0.7)
Eosinophils Relative: 0.1 % (ref 0.0–5.0)
HCT: 37.5 % (ref 36.0–46.0)
Hemoglobin: 12 g/dL (ref 12.0–15.0)
Lymphocytes Relative: 70.3 % — ABNORMAL HIGH (ref 12.0–46.0)
Lymphs Abs: 19.4 10*3/uL — ABNORMAL HIGH (ref 0.7–4.0)
MCHC: 32 g/dL (ref 30.0–36.0)
MCV: 93.6 fl (ref 78.0–100.0)
Monocytes Absolute: 1.7 10*3/uL — ABNORMAL HIGH (ref 0.1–1.0)
Monocytes Relative: 6 % (ref 3.0–12.0)
Neutro Abs: 6.5 10*3/uL (ref 1.4–7.7)
Neutrophils Relative %: 23.5 % — ABNORMAL LOW (ref 43.0–77.0)
Platelets: 287 10*3/uL (ref 150.0–400.0)
RBC: 4 Mil/uL (ref 3.87–5.11)
RDW: 14.4 % (ref 11.5–15.5)
WBC: 27.6 10*3/uL (ref 4.0–10.5)

## 2021-09-08 LAB — COMPREHENSIVE METABOLIC PANEL
ALT: 16 U/L (ref 0–35)
AST: 18 U/L (ref 0–37)
Albumin: 3.2 g/dL — ABNORMAL LOW (ref 3.5–5.2)
Alkaline Phosphatase: 76 U/L (ref 39–117)
BUN: 62 mg/dL — ABNORMAL HIGH (ref 6–23)
CO2: 26 mEq/L (ref 19–32)
Calcium: 8.7 mg/dL (ref 8.4–10.5)
Chloride: 101 mEq/L (ref 96–112)
Creatinine, Ser: 2.13 mg/dL — ABNORMAL HIGH (ref 0.40–1.20)
GFR: 20.43 mL/min — ABNORMAL LOW (ref >60.00)
Glucose, Bld: 255 mg/dL — ABNORMAL HIGH (ref 70–99)
Potassium: 4.3 mEq/L (ref 3.5–5.1)
Sodium: 135 mEq/L (ref 135–145)
Total Bilirubin: 0.4 mg/dL (ref 0.2–1.2)
Total Protein: 5.9 g/dL — ABNORMAL LOW (ref 6.0–8.3)

## 2021-09-08 NOTE — Addendum Note (Signed)
Addended by: Ellamae Sia on: 09/08/2021 04:26 PM   Modules accepted: Orders

## 2021-09-08 NOTE — Telephone Encounter (Signed)
Patient recently finished a 5 day course of 40mg  prednisone. Do feel this is reflective of steroid use.

## 2021-09-08 NOTE — Telephone Encounter (Signed)
[facility] lab called a critical result, WBC 27.6, results given to [facility]. Path review is ordered and being sent out per [facility] lab requirements.

## 2021-09-09 ENCOUNTER — Telehealth: Payer: Self-pay | Admitting: Nurse Practitioner

## 2021-09-09 LAB — PATHOLOGIST SMEAR REVIEW

## 2021-09-09 NOTE — Telephone Encounter (Signed)
Called Tamyrah's family member and alerted them on the lab results. Did reach out to Dr. Candiss Norse (nephrologist) he states the bump in creatinine and BUN is likely related to steroid use. He encouraged oral hydration and following the kidney functions.

## 2021-09-10 ENCOUNTER — Other Ambulatory Visit: Payer: Self-pay | Admitting: Nurse Practitioner

## 2021-09-10 DIAGNOSIS — D72829 Elevated white blood cell count, unspecified: Secondary | ICD-10-CM

## 2021-09-10 DIAGNOSIS — N184 Chronic kidney disease, stage 4 (severe): Secondary | ICD-10-CM

## 2021-09-14 ENCOUNTER — Other Ambulatory Visit (INDEPENDENT_AMBULATORY_CARE_PROVIDER_SITE_OTHER): Payer: Medicare PPO

## 2021-09-14 ENCOUNTER — Other Ambulatory Visit: Payer: Self-pay

## 2021-09-14 DIAGNOSIS — N184 Chronic kidney disease, stage 4 (severe): Secondary | ICD-10-CM | POA: Diagnosis not present

## 2021-09-14 DIAGNOSIS — D72829 Elevated white blood cell count, unspecified: Secondary | ICD-10-CM | POA: Diagnosis not present

## 2021-09-15 LAB — CBC WITH DIFFERENTIAL/PLATELET
Basophils Absolute: 0.1 10*3/uL (ref 0.0–0.1)
Basophils Relative: 0.8 % (ref 0.0–3.0)
Eosinophils Absolute: 0 10*3/uL (ref 0.0–0.7)
Eosinophils Relative: 0.2 % (ref 0.0–5.0)
HCT: 35.7 % — ABNORMAL LOW (ref 36.0–46.0)
Hemoglobin: 11.4 g/dL — ABNORMAL LOW (ref 12.0–15.0)
Lymphocytes Relative: 49.6 % — ABNORMAL HIGH (ref 12.0–46.0)
Lymphs Abs: 7.8 10*3/uL — ABNORMAL HIGH (ref 0.7–4.0)
MCHC: 32 g/dL (ref 30.0–36.0)
MCV: 93.2 fl (ref 78.0–100.0)
Monocytes Absolute: 1.5 10*3/uL — ABNORMAL HIGH (ref 0.1–1.0)
Monocytes Relative: 9.3 % (ref 3.0–12.0)
Neutro Abs: 6.3 10*3/uL (ref 1.4–7.7)
Neutrophils Relative %: 40.1 % — ABNORMAL LOW (ref 43.0–77.0)
Platelets: 216 10*3/uL (ref 150.0–400.0)
RBC: 3.83 Mil/uL — ABNORMAL LOW (ref 3.87–5.11)
RDW: 14.3 % (ref 11.5–15.5)
WBC: 15.8 10*3/uL — ABNORMAL HIGH (ref 4.0–10.5)

## 2021-09-15 LAB — BASIC METABOLIC PANEL
BUN: 31 mg/dL — ABNORMAL HIGH (ref 6–23)
CO2: 25 mEq/L (ref 19–32)
Calcium: 8.7 mg/dL (ref 8.4–10.5)
Chloride: 104 mEq/L (ref 96–112)
Creatinine, Ser: 1.7 mg/dL — ABNORMAL HIGH (ref 0.40–1.20)
GFR: 26.78 mL/min — ABNORMAL LOW (ref >60.00)
Glucose, Bld: 132 mg/dL — ABNORMAL HIGH (ref 70–99)
Potassium: 4.8 mEq/L (ref 3.5–5.1)
Sodium: 137 mEq/L (ref 135–145)

## 2021-09-27 DIAGNOSIS — I10 Essential (primary) hypertension: Secondary | ICD-10-CM | POA: Diagnosis not present

## 2021-09-27 DIAGNOSIS — N184 Chronic kidney disease, stage 4 (severe): Secondary | ICD-10-CM | POA: Diagnosis not present

## 2021-09-27 DIAGNOSIS — E1122 Type 2 diabetes mellitus with diabetic chronic kidney disease: Secondary | ICD-10-CM | POA: Diagnosis not present

## 2021-10-19 ENCOUNTER — Ambulatory Visit
Admission: RE | Admit: 2021-10-19 | Discharge: 2021-10-19 | Disposition: A | Discharge status: Home / Self Care | Payer: Medicare PPO | Source: Ambulatory Visit | Attending: Family Medicine | Admitting: Family Medicine

## 2021-10-19 DIAGNOSIS — E348 Other specified endocrine disorders: Secondary | ICD-10-CM

## 2021-10-19 DIAGNOSIS — Z780 Asymptomatic menopausal state: Secondary | ICD-10-CM | POA: Diagnosis not present

## 2021-11-06 ENCOUNTER — Encounter (HOSPITAL_BASED_OUTPATIENT_CLINIC_OR_DEPARTMENT_OTHER): Payer: Self-pay | Admitting: Pediatrics

## 2021-11-06 ENCOUNTER — Emergency Department (HOSPITAL_BASED_OUTPATIENT_CLINIC_OR_DEPARTMENT_OTHER): Payer: Medicare PPO

## 2021-11-06 ENCOUNTER — Other Ambulatory Visit: Payer: Self-pay

## 2021-11-06 ENCOUNTER — Emergency Department (HOSPITAL_BASED_OUTPATIENT_CLINIC_OR_DEPARTMENT_OTHER)
Admission: EM | Admit: 2021-11-06 | Discharge: 2021-11-06 | Disposition: A | Discharge status: Home / Self Care | Payer: Medicare PPO | Attending: Emergency Medicine | Admitting: Emergency Medicine

## 2021-11-06 DIAGNOSIS — N261 Atrophy of kidney (terminal): Secondary | ICD-10-CM | POA: Diagnosis not present

## 2021-11-06 DIAGNOSIS — R1084 Generalized abdominal pain: Secondary | ICD-10-CM | POA: Insufficient documentation

## 2021-11-06 DIAGNOSIS — E1122 Type 2 diabetes mellitus with diabetic chronic kidney disease: Secondary | ICD-10-CM | POA: Insufficient documentation

## 2021-11-06 DIAGNOSIS — R197 Diarrhea, unspecified: Secondary | ICD-10-CM | POA: Diagnosis not present

## 2021-11-06 DIAGNOSIS — N289 Disorder of kidney and ureter, unspecified: Secondary | ICD-10-CM | POA: Diagnosis not present

## 2021-11-06 DIAGNOSIS — K5730 Diverticulosis of large intestine without perforation or abscess without bleeding: Secondary | ICD-10-CM | POA: Diagnosis not present

## 2021-11-06 DIAGNOSIS — N189 Chronic kidney disease, unspecified: Secondary | ICD-10-CM | POA: Diagnosis not present

## 2021-11-06 DIAGNOSIS — D72829 Elevated white blood cell count, unspecified: Secondary | ICD-10-CM | POA: Insufficient documentation

## 2021-11-06 DIAGNOSIS — I129 Hypertensive chronic kidney disease with stage 1 through stage 4 chronic kidney disease, or unspecified chronic kidney disease: Secondary | ICD-10-CM | POA: Diagnosis not present

## 2021-11-06 DIAGNOSIS — Z20822 Contact with and (suspected) exposure to covid-19: Secondary | ICD-10-CM | POA: Insufficient documentation

## 2021-11-06 DIAGNOSIS — N281 Cyst of kidney, acquired: Secondary | ICD-10-CM | POA: Diagnosis not present

## 2021-11-06 DIAGNOSIS — Z96643 Presence of artificial hip joint, bilateral: Secondary | ICD-10-CM | POA: Insufficient documentation

## 2021-11-06 DIAGNOSIS — A0472 Enterocolitis due to Clostridium difficile, not specified as recurrent: Secondary | ICD-10-CM

## 2021-11-06 LAB — COMPREHENSIVE METABOLIC PANEL WITH GFR
ALT: 7 U/L (ref 0–44)
AST: 11 U/L — ABNORMAL LOW (ref 15–41)
Albumin: 3.5 g/dL (ref 3.5–5.0)
Alkaline Phosphatase: 82 U/L (ref 38–126)
Anion gap: 8 (ref 5–15)
BUN: 36 mg/dL — ABNORMAL HIGH (ref 8–23)
CO2: 25 mmol/L (ref 22–32)
Calcium: 8.8 mg/dL — ABNORMAL LOW (ref 8.9–10.3)
Chloride: 106 mmol/L (ref 98–111)
Creatinine, Ser: 1.8 mg/dL — ABNORMAL HIGH (ref 0.44–1.00)
GFR, Estimated: 27 mL/min — ABNORMAL LOW
Glucose, Bld: 147 mg/dL — ABNORMAL HIGH (ref 70–99)
Potassium: 4 mmol/L (ref 3.5–5.1)
Sodium: 139 mmol/L (ref 135–145)
Total Bilirubin: 0.5 mg/dL (ref 0.3–1.2)
Total Protein: 6.2 g/dL — ABNORMAL LOW (ref 6.5–8.1)

## 2021-11-06 LAB — RESP PANEL BY RT-PCR (FLU A&B, COVID) ARPGX2
Influenza A by PCR: NEGATIVE
Influenza B by PCR: NEGATIVE
SARS Coronavirus 2 by RT PCR: NEGATIVE

## 2021-11-06 LAB — CBC WITH DIFFERENTIAL/PLATELET
Abs Immature Granulocytes: 0.2 10*3/uL — ABNORMAL HIGH (ref 0.00–0.07)
Basophils Absolute: 0 10*3/uL (ref 0.0–0.1)
Basophils Relative: 0 %
Eosinophils Absolute: 0.1 10*3/uL (ref 0.0–0.5)
Eosinophils Relative: 1 %
HCT: 34.7 % — ABNORMAL LOW (ref 36.0–46.0)
Hemoglobin: 10.9 g/dL — ABNORMAL LOW (ref 12.0–15.0)
Immature Granulocytes: 2 %
Lymphocytes Relative: 39 %
Lymphs Abs: 4.8 10*3/uL — ABNORMAL HIGH (ref 0.7–4.0)
MCH: 29.8 pg (ref 26.0–34.0)
MCHC: 31.4 g/dL (ref 30.0–36.0)
MCV: 94.8 fL (ref 80.0–100.0)
Monocytes Absolute: 1.6 10*3/uL — ABNORMAL HIGH (ref 0.1–1.0)
Monocytes Relative: 13 %
Neutro Abs: 5.6 10*3/uL (ref 1.7–7.7)
Neutrophils Relative %: 45 %
Platelets: 210 10*3/uL (ref 150–400)
RBC: 3.66 MIL/uL — ABNORMAL LOW (ref 3.87–5.11)
RDW: 14 % (ref 11.5–15.5)
WBC: 12.3 10*3/uL — ABNORMAL HIGH (ref 4.0–10.5)
nRBC: 0 % (ref 0.0–0.2)

## 2021-11-06 LAB — LIPASE, BLOOD: Lipase: <10 U/L — ABNORMAL LOW (ref 11–51)

## 2021-11-06 LAB — C DIFFICILE QUICK SCREEN W PCR REFLEX
C Diff antigen: NEGATIVE
C Diff toxin: POSITIVE — AB

## 2021-11-06 LAB — CLOSTRIDIUM DIFFICILE BY PCR, REFLEXED: Toxigenic C. Difficile by PCR: POSITIVE — AB

## 2021-11-06 MED ORDER — VANCOMYCIN HCL 125 MG PO CAPS: 125.0000 mg | ORAL_CAPSULE | Freq: Four times a day (QID) | ORAL | 0 refills | Status: DC

## 2021-11-06 MED ORDER — SODIUM CHLORIDE 0.9 % IV BOLUS
1000.0000 mL | Freq: Once | INTRAVENOUS | Status: AC
  Administered 2021-11-06: 1000 mL via INTRAVENOUS

## 2021-11-06 NOTE — ED Triage Notes (Signed)
Concern for C diff reoccurrence; reported stool is mucousy and  lost count on how many episodes; today is day 3.  ?

## 2021-11-06 NOTE — ED Notes (Signed)
Notified RN of elevated BP / repositioned cuff to right arm  ?

## 2021-11-06 NOTE — ED Notes (Signed)
180 ml water given to patient RN approved  ?

## 2021-11-06 NOTE — ED Notes (Signed)
Patient given discharge instructions. Questions were answered. Patient verbalized understanding of discharge instructions and care at home. IV removed ? ?Patient discharged with family. ?

## 2021-11-06 NOTE — ED Provider Notes (Signed)
Patient's symptoms clinically very suggestive of C. difficile colitis.  Patient's C. difficile antigen was negative C. difficile toxin was positive.  Make things equivocal.  It will be based on PCR.   ? ?Patient followed by infectious diseases at [facility] in the past.  Daughter can make an appointment to follow-up with them.  We will treat her with a vancomycin.  125 mg orally every 6 for 10 days and then they can follow-up with infectious disease.  Patient nontoxic no acute distress. ?  ?Fredia Sorrow, MD ?11/06/21 1853 ? ?

## 2021-11-06 NOTE — ED Provider Notes (Signed)
?[facility] DEPT ?Provider Note ? ? ?CSN: [id number] ?Arrival date & time: 11/06/21  1343 ? ?  ? ?History ? ?Chief Complaint  ?Patient presents with  ? Diarrhea  ? ? ?Jill [name] is a 88 y.o. female. ? ?This is a 88 y.o. fem  with significant medical history as below, including recurrent c-diff colitis, HTN HLD who presents to the ED with complaint of diarrhea/abd pain ? ?Location:  diffuse ?Duration:  3-4 days  ?Onset:  sudden ?Timing:  constant ?Description:  copious malodorous stool similar to prior cdiff, >10 episodes daily. ?Severity:  mild ?Exacerbating/Alleviating Factors:  none ?Associated Symptoms:  poor po, malaise, abd pain ?Pertinent Negatives:  no fevers or chills, no emesis, no recnt rx changes ? ? ? ?Past Medical History: ?No date: Allergy ?No date: Arthritis ?    Comment:  osteoarthritis  ?No date: Chronic kidney disease ?No date: Complication of anesthesia ?    Comment:  difficult waking  ?No date: Depression ?No date: Diabetes mellitus ?No date: Diverticula, colon ?No date: Family history of anesthesia complication ?    Comment:  Son is difficult intubation ?No date: Hyperlipidemia ?No date: Hypertension ?2006: Vasovagal syncope ?    Comment:  Negative cardiac workup-myoview, echo ? ?Past Surgical History: ?1977: ABDOMINAL HYSTERECTOMY ?    Comment:  fibroid ?2009: JOINT REPLACEMENT ?    Comment:  rt hip ?05/12/2014: TOTAL HIP ARTHROPLASTY; Left ?    Comment:  dr Mayer Camel ?05/12/2014: TOTAL HIP ARTHROPLASTY; Left ?    Comment:  Procedure: TOTAL HIP ARTHROPLASTY;  Surgeon: Kathalene Frames  ?             Mayer Camel, MD;  Location: [facility];  Service: Orthopedics;   ?             Laterality: Left;  ? ? ?The history is provided by the patient and a relative. No language interpreter was used.  ?Diarrhea ?Associated symptoms: abdominal pain   ? ?  ? ?Home Medications ?Prior to Admission medications   ?Medication Sig Start Date End Date Taking? Authorizing Provider  ?acetaminophen (TYLENOL) 500 MG  tablet Take 1,000 mg by mouth every 6 (six) hours as needed for moderate pain or headache.    [provider]  ?albuterol (VENTOLIN HFA) 108 (90 Base) MCG/ACT inhaler Inhale 2 puffs into the lungs every 6 (six) hours as needed for wheezing or shortness of breath. 09/02/21   Michela Pitcher, NP  ?allopurinol (ZYLOPRIM) 100 MG tablet Take 0.5 tablets (50 mg total) by mouth every other day. 03/05/21   Jinny Sanders, MD  ?Cranberry 500 MG CAPS Take by mouth.    [provider]  ?diclofenac Sodium (VOLTAREN) 1 % GEL Apply 2 g topically 4 (four) times daily as needed.    [provider]  ?donepezil (ARICEPT) 10 MG tablet Take 1 tablet (10 mg total) by mouth at bedtime. 04/02/21   Bedsole, Amy E, MD  ?escitalopram (LEXAPRO) 20 MG tablet TAKE 1 TABLET BY MOUTH EVERY DAY 04/30/21   Diona Browner, Amy E, MD  ?estradiol (ESTRACE VAGINAL) 0.1 MG/GM vaginal cream Apply 0.'5mg'$  (pea-sized amount)  just inside the vaginal introitus with a finger-tip on Monday, Wednesday and Friday nights. 02/16/21   Zara Council A, PA-C  ?fidaxomicin (DIFICID) 200 MG TABS tablet Take 1 tablet (200 mg total) by mouth 2 (two) times daily. 08/26/21   Eliezer Lofts, FNP  ?hydrOXYzine (ATARAX/VISTARIL) 10 MG tablet TAKE 0.5-1 TABLETS (5-10 MG TOTAL) BY MOUTH DAILY AS  NEEDED FOR ANXIETY. 02/10/21   Bedsole, Amy E, MD  ?loratadine (CLARITIN) 10 MG tablet Take by mouth.    [provider]  ?Multiple Vitamins-Minerals (MULTIVITAMIN WITH MINERALS) tablet Take 1 tablet by mouth daily.    [provider]  ?Probiotic Product (PROBIOTIC DAILY PO) Take by mouth.    [provider]  ?TART CHERRY PO Take 1 tablet by mouth in the morning and at bedtime.    [provider]  ?tiotropium (SPIRIVA HANDIHALER) 18 MCG inhalation capsule INHALE 1 CAPSULE VIA HANDIHALER ONCE DAILY AT THE SAME TIME EVERY DAY 09/16/20   Jinny Sanders, MD  ?Vitamin D, Ergocalciferol, (DRISDOL) 1.25 MG (50000 UNIT) CAPS capsule Take 1 capsule  (50,000 Units total) by mouth every Friday. 03/05/21   Jinny Sanders, MD  ?   ? ?Allergies    ?Lovastatin, Statins, Sulfa antibiotics, Cephalosporins, Codeine, and Niacin   ? ?Review of Systems   ?Review of Systems  ?Constitutional:  Positive for appetite change and fatigue.  ?Gastrointestinal:  Positive for abdominal pain and diarrhea.  ?All other systems reviewed and are negative. ? ?Physical Exam ?Updated Vital Signs ?BP (!) 169/68   Pulse 74   Temp (!) 97.4 ?F (36.3 ?C)   Resp 17   Ht '5\' 4"'$  (1.626 m)   Wt 70.8 kg   SpO2 96%   BMI 26.78 kg/m?  ?Physical Exam ?Vitals and nursing note reviewed.  ?Constitutional:   ?   General: She is not in acute distress. ?   Appearance: Normal appearance.  ?HENT:  ?   Head: Normocephalic and atraumatic.  ?   Right Ear: External ear normal.  ?   Left Ear: External ear normal.  ?   Nose: Nose normal.  ?   Mouth/Throat:  ?   Mouth: Mucous membranes are moist.  ?Eyes:  ?   General: No scleral icterus.    ?   Right eye: No discharge.     ?   Left eye: No discharge.  ?Cardiovascular:  ?   Rate and Rhythm: Normal rate and regular rhythm.  ?   Pulses: Normal pulses.  ?   Heart sounds: Normal heart sounds.  ?Pulmonary:  ?   Effort: Pulmonary effort is normal. No respiratory distress.  ?   Breath sounds: Normal breath sounds.  ?Abdominal:  ?   General: Abdomen is flat. There is no distension.  ?   Palpations: Abdomen is soft.  ?   Tenderness: There is generalized abdominal tenderness. There is no guarding or rebound.  ?Musculoskeletal:     ?   General: Normal range of motion.  ?   Cervical back: Normal range of motion.  ?   Right lower leg: No edema.  ?   Left lower leg: No edema.  ?Skin: ?   General: Skin is warm and dry.  ?   Capillary Refill: Capillary refill takes less than 2 seconds.  ?Neurological:  ?   Mental Status: She is alert and oriented to person, place, and time.  ?   GCS: GCS eye subscore is 4. GCS verbal subscore is 5. GCS motor subscore is 6.  ?Psychiatric:     ?    Mood and Affect: Mood normal.     ?   Behavior: Behavior normal.  ? ? ?ED Results / Procedures / Treatments   ?Labs ?(all labs ordered are listed, but only abnormal results are displayed) ?Labs Reviewed  ?CBC WITH DIFFERENTIAL/PLATELET - Abnormal; Notable for the following  components:  ?    Result Value  ? WBC 12.3 (*)   ? RBC 3.66 (*)   ? Hemoglobin 10.9 (*)   ? HCT 34.7 (*)   ? Lymphs Abs 4.8 (*)   ? Monocytes Absolute 1.6 (*)   ? Abs Immature Granulocytes 0.20 (*)   ? All other components within normal limits  ?COMPREHENSIVE METABOLIC PANEL - Abnormal; Notable for the following components:  ? Glucose, Bld 147 (*)   ? BUN 36 (*)   ? Creatinine, Ser 1.80 (*)   ? Calcium 8.8 (*)   ? Total Protein 6.2 (*)   ? AST 11 (*)   ? GFR, Estimated 27 (*)   ? All other components within normal limits  ?LIPASE, BLOOD - Abnormal; Notable for the following components:  ? Lipase <10 (*)   ? All other components within normal limits  ?RESP PANEL BY RT-PCR (FLU A&B, COVID) ARPGX2  ?C DIFFICILE QUICK SCREEN W PCR REFLEX    ?URINALYSIS, ROUTINE W REFLEX MICROSCOPIC  ? ? ?EKG ?None ? ?Radiology ?CT ABDOMEN PELVIS WO CONTRAST ? ?Result Date: 11/06/2021 ?CLINICAL DATA:  Abdominal pain, diarrhea, concern for C diff recurrence EXAM: CT ABDOMEN AND PELVIS WITHOUT CONTRAST TECHNIQUE: Multidetector CT imaging of the abdomen and pelvis was performed following the standard protocol without IV contrast. RADIATION DOSE REDUCTION: This exam was performed according to the departmental dose-optimization program which includes automated exposure control, adjustment of the mA and/or kV according to patient size and/or use of iterative reconstruction technique. COMPARISON:  01/29/2021 FINDINGS: Lower chest: No acute abnormality.  Small hiatal hernia. Hepatobiliary: No solid liver abnormality is seen. No gallstones, gallbladder wall thickening, or biliary dilatation. Pancreas: Unremarkable. No pancreatic ductal dilatation or surrounding inflammatory  changes. Spleen: Normal in size without significant abnormality. Adrenals/Urinary Tract: Adrenal glands are unremarkable. Atrophic appearing kidneys. Multiple bilateral renal lesions of varying attenuation, some

## 2021-11-06 NOTE — Discharge Instructions (Addendum)
It was a pleasure caring for you today in the emergency department. ? ?Please return to the emergency department for any worsening or worrisome symptoms. ? ?Take the vancomycin as ordered.  Follow-up with infectious disease in [place].  Follow-up for any new or worse symptoms. ? ?

## 2021-11-08 ENCOUNTER — Encounter: Payer: Self-pay | Admitting: Family Medicine

## 2021-11-09 ENCOUNTER — Ambulatory Visit: Payer: Medicare PPO | Admitting: Family Medicine

## 2021-11-09 NOTE — Telephone Encounter (Signed)
Appointment was cancelled.

## 2021-11-09 NOTE — Telephone Encounter (Signed)
Noted. Please make sure her appt is cancelled. ?

## 2021-11-26 ENCOUNTER — Telehealth: Payer: Medicare PPO | Admitting: Emergency Medicine

## 2021-11-26 ENCOUNTER — Telehealth: Payer: Medicare PPO | Admitting: Physician Assistant

## 2021-11-26 DIAGNOSIS — A0472 Enterocolitis due to Clostridium difficile, not specified as recurrent: Secondary | ICD-10-CM

## 2021-11-26 MED ORDER — VANCOMYCIN HCL 125 MG PO CAPS: 125.0000 mg | ORAL_CAPSULE | Freq: Four times a day (QID) | ORAL | 0 refills | Status: DC

## 2021-11-26 NOTE — Progress Notes (Signed)
Based on the information that you have shared in the e-Visit Questionnaire, we recommend that you convert this visit to a video visit in order for the provider to better assess what is going on.  The provider will be able to give you a more accurate diagnosis and treatment plan if we can more freely discuss your symptoms and with the addition of a virtual examination.  ? ?It is recommended to proceed with a video visit for any medication refills. Family can assist, but patient will need to be present as well.  ? ?If you convert to a video visit, we will bill your insurance (similar to an office visit) and you will not be charged for this e-Visit. You will be able to stay at home and speak with the first available [facility] advanced practice provider. The link to do a video visit is in the drop down Menu tab of your Welcome screen in [place]. ? ?I provided 5 minutes of non face-to-face time during this encounter for chart review and documentation.  ? ?

## 2021-11-26 NOTE — Patient Instructions (Signed)
?[name], thank you for joining Carvel Getting, NP for today's virtual visit.  While this provider is not your primary care provider (PCP), if your PCP is located in our provider database this encounter information will be shared with them immediately following your visit. ? ?Consent: ?(Patient) Jill [name] provided verbal consent for this virtual visit at the beginning of the encounter. ? ?Current Medications: ? ?Current Outpatient Medications:  ?  vancomycin (VANCOCIN) 125 MG capsule, Take 1 capsule (125 mg total) by mouth 4 (four) times daily for 10 days., Disp: 40 capsule, Rfl: 0 ?  acetaminophen (TYLENOL) 500 MG tablet, Take 1,000 mg by mouth every 6 (six) hours as needed for moderate pain or headache., Disp: , Rfl:  ?  albuterol (VENTOLIN HFA) 108 (90 Base) MCG/ACT inhaler, Inhale 2 puffs into the lungs every 6 (six) hours as needed for wheezing or shortness of breath., Disp: 8 g, Rfl: 0 ?  allopurinol (ZYLOPRIM) 100 MG tablet, Take 0.5 tablets (50 mg total) by mouth every other day., Disp: 15 tablet, Rfl: 11 ?  Cranberry 500 MG CAPS, Take by mouth., Disp: , Rfl:  ?  diclofenac Sodium (VOLTAREN) 1 % GEL, Apply 2 g topically 4 (four) times daily as needed., Disp: , Rfl:  ?  donepezil (ARICEPT) 10 MG tablet, Take 1 tablet (10 mg total) by mouth at bedtime., Disp: 30 tablet, Rfl: 11 ?  escitalopram (LEXAPRO) 20 MG tablet, TAKE 1 TABLET BY MOUTH EVERY DAY, Disp: 90 tablet, Rfl: 1 ?  estradiol (ESTRACE VAGINAL) 0.1 MG/GM vaginal cream, Apply 0.'5mg'$  (pea-sized amount)  just inside the vaginal introitus with a finger-tip on Monday, Wednesday and Friday nights., Disp: 30 g, Rfl: 12 ?  hydrOXYzine (ATARAX/VISTARIL) 10 MG tablet, TAKE 0.5-1 TABLETS (5-10 MG TOTAL) BY MOUTH DAILY AS NEEDED FOR ANXIETY., Disp: 90 tablet, Rfl: 0 ?  loratadine (CLARITIN) 10 MG tablet, Take by mouth., Disp: , Rfl:  ?  Multiple Vitamins-Minerals (MULTIVITAMIN WITH MINERALS) tablet, Take 1 tablet by mouth daily., Disp: , Rfl:  ?   Probiotic Product (PROBIOTIC DAILY PO), Take by mouth., Disp: , Rfl:  ?  TART CHERRY PO, Take 1 tablet by mouth in the morning and at bedtime., Disp: , Rfl:  ?  tiotropium (SPIRIVA HANDIHALER) 18 MCG inhalation capsule, INHALE 1 CAPSULE VIA HANDIHALER ONCE DAILY AT THE SAME TIME EVERY DAY, Disp: 30 capsule, Rfl: 5 ?  Vitamin D, Ergocalciferol, (DRISDOL) 1.25 MG (50000 UNIT) CAPS capsule, Take 1 capsule (50,000 Units total) by mouth every Friday., Disp: 12 capsule, Rfl: 3  ? ?Medications ordered in this encounter:  ?Meds ordered this encounter  ?Medications  ? DISCONTD: vancomycin (VANCOCIN) 125 MG capsule  ?  Sig: Take 1 capsule (125 mg total) by mouth 4 (four) times daily.  ?  Dispense:  40 capsule  ?  Refill:  0  ? DISCONTD: vancomycin (VANCOCIN) 125 MG capsule  ?  Sig: Take 1 capsule (125 mg total) by mouth 4 (four) times daily.  ?  Dispense:  40 capsule  ?  Refill:  0  ? vancomycin (VANCOCIN) 125 MG capsule  ?  Sig: Take 1 capsule (125 mg total) by mouth 4 (four) times daily for 10 days.  ?  Dispense:  40 capsule  ?  Refill:  0  ?  ? ?*If you need refills on other medications prior to your next appointment, please contact your pharmacy* ? ?Follow-Up: ?Call back or seek an in-person evaluation if the symptoms worsen or  if the condition fails to improve as anticipated. ? ?Other Instructions ?Please follow-up with infectious disease as scheduled next week on 12/02/2021. ? ?If other symptoms develop, such as vomiting, please seek emergency care. ? ? ?If you have been instructed to have an in-person evaluation today at a local Urgent Care facility, please use the link below. It will take you to a list of all of our available [facility], including address, phone number and hours of operation. Please do not delay care.  ?[facility] ? ?If you or a family member do not have a primary care provider, use the link below to schedule a visit and establish care. When you choose a [facility] primary  care physician or advanced practice provider, you gain a long-term partner in health. ?Find a Primary Care Provider ? ?Learn more about [facility]'s in-office and virtual care options: ?[facility]  ?

## 2021-11-26 NOTE — Progress Notes (Signed)
?Virtual Visit Consent  ? ?Shali Wilkie Aye, you are scheduled for a virtual visit with a [facility] provider today.   ?  ?Just as with appointments in the office, your consent must be obtained to participate.  Your consent will be active for this visit and any virtual visit you may have with one of our providers in the next 365 days.   ?  ?If you have a MyChart account, a copy of this consent can be sent to you electronically.  All virtual visits are billed to your insurance company just like a traditional visit in the office.   ? ?As this is a virtual visit, video technology does not allow for your provider to perform a traditional examination.  This may limit your provider's ability to fully assess your condition.  If your provider identifies any concerns that need to be evaluated in person or the need to arrange testing (such as labs, EKG, etc.), we will make arrangements to do so.   ?  ?Although advances in technology are sophisticated, we cannot ensure that it will always work on either your end or our end.  If the connection with a video visit is poor, the visit may have to be switched to a telephone visit.  With either a video or telephone visit, we are not always able to ensure that we have a secure connection.    ? ?I need to obtain your verbal consent now.   Are you willing to proceed with your visit today?  ?  ?NAASIA WEILBACHER has provided verbal consent on 11/26/2021 for a virtual visit (video or telephone). ?  ?Carvel Getting, NP  ? ?Date: 11/26/2021 5:40 PM ? ? ?Virtual Visit via Video Note  ? ?Herbie Baltimore, connected with  AALYSSA ELDERKIN  ([id number], 86/18/1933) on 11/26/21 at  5:00 PM EDT by a video-enabled telemedicine application and verified that I am speaking with the correct person using two identifiers. ? ?Location: ?Patient: Virtual Visit Location Patient: Home ?Provider: Virtual Visit Location Provider: Home Office ?  ?I discussed the limitations of evaluation and management by telemedicine  and the availability of in person appointments. The patient expressed understanding and agreed to proceed.   ? ?History of Present Illness: ?BRIHANNA DEVENPORT is a 88 y.o. who identifies as a female who was assigned female at birth, and is being seen today for diarrhea.  Patient's granddaughter, [name], is present for the video visit as well.  [name] and Shanora reports that Krystall has had diarrhea since yesterday.  She has a history of C. difficile colitis.  She was treated with vancomycin in March, she finished the course about 10 days ago.  She felt better until yesterday when her diarrhea recurred.  Kristyne does have a follow-up appointment with infectious disease but it is not until next week on 12/02/2021.  Passages told [name] today about her diarrhea symptoms and unfortunately the infectious disease office and her PCPs office are closed due to the holiday. ? ?Denies fever, nausea, vomiting. ? ?HPI: HPI  ?Problems:  ?Patient Active Problem List  ? Diagnosis Date Noted  ? Influenza A 09/02/2021  ? Acute cough 09/02/2021  ? History of heart failure 09/02/2021  ? Shortness of breath 09/02/2021  ? History of COPD 09/02/2021  ? Pelvic fracture ([place]) 04/07/2021  ? Frequent falls 04/07/2021  ? Situational anxiety 04/07/2021  ? Chronic gout without tophus 04/02/2021  ? Subdural hematoma ([place]) 01/19/2021  ? Recurrent Clostridioides difficile diarrhea  11/14/2020  ? Acute pain of left knee 09/29/2020  ? Mild dementia ([place]) 07/03/2020  ? Acute pain of right shoulder 07/03/2020  ? Left foot pain 04/09/2020  ? Bilateral leg weakness 05/21/2019  ? Pulmonary nodule 05/29/2018  ? Urinary urgency 02/03/2017  ? Bilateral buttock pain 02/02/2017  ? Gout involving toe of right foot 12/23/2016  ? Candidal intertrigo 10/28/2016  ? Stage 2 moderate COPD by GOLD classification ([place]) 09/13/2016  ? Type 2 diabetes mellitus with stage 3 chronic kidney disease, without long-term current use of insulin ([place]) 08/08/2016  ? Chronic diastolic  congestive heart failure ([place]) 08/08/2016  ? Intracranial bleed ([place])   ? BPPV (benign paroxysmal positional vertigo) 06/24/2016  ? Accidental fall 06/24/2016  ? Chronic neck pain 03/18/2016  ? Neuropathy due to type 2 diabetes mellitus ([place]) 03/18/2016  ? Anemia in chronic kidney disease 12/15/2015  ? Incomplete bladder emptying 10/15/2015  ? Abscess, renal/perirenal 10/15/2015  ? Recurrent UTI 10/15/2015  ? Lumbar back pain with radiculopathy affecting left lower extremity 05/05/2015  ? Lesion of left native kidney 01/28/2015  ? Chronic fatigue 01/22/2015  ? Chronic idiopathic urticaria 01/17/2015  ? S/P hip replacement 05/16/2014  ? DNR (do not resuscitate) 02/20/2014  ? ALLERGIC RHINITIS 12/17/2008  ? Vitamin D deficiency 07/01/2008  ? CKD (chronic kidney disease) stage 4, GFR 15-29 ml/min (HCC) 02/11/2008  ? DIVERTICULOSIS OF COLON 02/01/2008  ? COLONIC POLYPS, ADENOMATOUS 10/19/2006  ? Type 2 diabetes mellitus with diabetic neuropathy, unspecified ([place]) 10/19/2006  ? Hyperlipidemia associated with type 2 diabetes mellitus ([place]) 10/19/2006  ? OBESITY, NOS 10/19/2006  ? Major depression, recurrent ([place]) 10/19/2006  ? Hypertension associated with diabetes ([place]) 10/19/2006  ? Recurrent urticaria 10/19/2006  ?  ?Allergies:  ?Allergies  ?Allergen Reactions  ? Lovastatin Other (See Comments)  ?  REACTION: leg pain  ? Statins Other (See Comments)  ?  REACTION: leg cramps, weakness  ? Sulfa Antibiotics Hives and Itching  ? Cephalosporins   ? Codeine Rash  ? Niacin Rash  ?  Flushing also  ? ?Medications:  ?Current Outpatient Medications:  ?  vancomycin (VANCOCIN) 125 MG capsule, Take 1 capsule (125 mg total) by mouth 4 (four) times daily for 10 days., Disp: 40 capsule, Rfl: 0 ?  acetaminophen (TYLENOL) 500 MG tablet, Take 1,000 mg by mouth every 6 (six) hours as needed for moderate pain or headache., Disp: , Rfl:  ?  albuterol (VENTOLIN HFA) 108 (90 Base) MCG/ACT inhaler, Inhale 2 puffs into the lungs every 6 (six)  hours as needed for wheezing or shortness of breath., Disp: 8 g, Rfl: 0 ?  allopurinol (ZYLOPRIM) 100 MG tablet, Take 0.5 tablets (50 mg total) by mouth every other day., Disp: 15 tablet, Rfl: 11 ?  Cranberry 500 MG CAPS, Take by mouth., Disp: , Rfl:  ?  diclofenac Sodium (VOLTAREN) 1 % GEL, Apply 2 g topically 4 (four) times daily as needed., Disp: , Rfl:  ?  donepezil (ARICEPT) 10 MG tablet, Take 1 tablet (10 mg total) by mouth at bedtime., Disp: 30 tablet, Rfl: 11 ?  escitalopram (LEXAPRO) 20 MG tablet, TAKE 1 TABLET BY MOUTH EVERY DAY, Disp: 90 tablet, Rfl: 1 ?  estradiol (ESTRACE VAGINAL) 0.1 MG/GM vaginal cream, Apply 0.'5mg'$  (pea-sized amount)  just inside the vaginal introitus with a finger-tip on Monday, Wednesday and Friday nights., Disp: 30 g, Rfl: 12 ?  hydrOXYzine (ATARAX/VISTARIL) 10 MG tablet, TAKE 0.5-1 TABLETS (5-10 MG TOTAL) BY MOUTH DAILY AS NEEDED FOR ANXIETY.,  Disp: 90 tablet, Rfl: 0 ?  loratadine (CLARITIN) 10 MG tablet, Take by mouth., Disp: , Rfl:  ?  Multiple Vitamins-Minerals (MULTIVITAMIN WITH MINERALS) tablet, Take 1 tablet by mouth daily., Disp: , Rfl:  ?  Probiotic Product (PROBIOTIC DAILY PO), Take by mouth., Disp: , Rfl:  ?  TART CHERRY PO, Take 1 tablet by mouth in the morning and at bedtime., Disp: , Rfl:  ?  tiotropium (SPIRIVA HANDIHALER) 18 MCG inhalation capsule, INHALE 1 CAPSULE VIA HANDIHALER ONCE DAILY AT THE SAME TIME EVERY DAY, Disp: 30 capsule, Rfl: 5 ?  Vitamin D, Ergocalciferol, (DRISDOL) 1.25 MG (50000 UNIT) CAPS capsule, Take 1 capsule (50,000 Units total) by mouth every Friday., Disp: 12 capsule, Rfl: 3 ? ?Observations/Objective: ?Patient is well-developed, well-nourished in no acute distress.  ?Resting comfortably  at home.  ?Head is normocephalic, atraumatic.  ?No labored breathing.  ?Speech is clear and coherent with logical content.  ?Patient is alert and oriented at baseline.  She does have a history of mild dementia. ? ? ?Assessment and Plan: ?1. C. difficile  colitis ? ?I do not think is reasonable to make patient wait until her infectious disease follow-up appointment next week to get treatment.  I will prescribe another course of vancomycin.  Reviewing prior records,

## 2021-11-29 ENCOUNTER — Encounter: Payer: Self-pay | Admitting: Physician Assistant

## 2021-12-02 ENCOUNTER — Ambulatory Visit: Payer: Medicare PPO | Attending: Infectious Diseases | Admitting: Infectious Diseases

## 2021-12-02 VITALS — BP 134/77 | HR 74 | Temp 98.1°F | Resp 16 | Ht 64.0 in | Wt 167.0 lb

## 2021-12-02 DIAGNOSIS — A498 Other bacterial infections of unspecified site: Secondary | ICD-10-CM | POA: Diagnosis not present

## 2021-12-02 DIAGNOSIS — Z792 Long term (current) use of antibiotics: Secondary | ICD-10-CM | POA: Diagnosis not present

## 2021-12-02 DIAGNOSIS — E119 Type 2 diabetes mellitus without complications: Secondary | ICD-10-CM | POA: Diagnosis not present

## 2021-12-02 DIAGNOSIS — N390 Urinary tract infection, site not specified: Secondary | ICD-10-CM | POA: Insufficient documentation

## 2021-12-02 DIAGNOSIS — M109 Gout, unspecified: Secondary | ICD-10-CM | POA: Diagnosis not present

## 2021-12-02 DIAGNOSIS — Z87440 Personal history of urinary (tract) infections: Secondary | ICD-10-CM | POA: Insufficient documentation

## 2021-12-02 DIAGNOSIS — B9689 Other specified bacterial agents as the cause of diseases classified elsewhere: Secondary | ICD-10-CM | POA: Diagnosis not present

## 2021-12-02 MED ORDER — VANCOMYCIN HCL 125 MG PO CAPS: 125.0000 mg | ORAL_CAPSULE | Freq: Four times a day (QID) | ORAL | 0 refills | Status: AC

## 2021-12-02 NOTE — Progress Notes (Signed)
NAME: Jill [name]  ?DOB: [birth year]  ?MRN: [id number]  ?Date/Time: 12/02/2021 11:12 AM ? ? ?Subjective:  ?Patient patient is here with her granddaughter. ?? ?Jill [name] is a 88 y.o. female with a history of diet-controlled diabetes, gout, recurrent C. difficile infection,  recurrent UTI is referred to me for  another C. difficile infection. ?I last saw patient in August 2022 and had advised her not to take any antibiotics for UTI unless she was systemically ill. ?Also advised her that to let the providers know not to check her urine on a routine basis as it would always show some pyuria ?She got a prolonged course of vancomycin taper and was completely normal. ?In January 2023 she had taken doxycycline for a pneumonia.  She also took vancomycin at the same time she was doing fine until mid March when started to have diarrhea..  Stool test done on 11/06/2021 was positive for C. difficile toxin but antigen was negative and toxigenic C. difficile with PCR was positive. ?Her PCP has restarted her on vancomycin and referred her to me. ? .  Patient is feeling better since she started on vancomycin. ?Past Medical History:  ?Diagnosis Date  ? Allergy   ? Arthritis   ? osteoarthritis   ? Chronic kidney disease   ? Complication of anesthesia   ? difficult waking   ? Depression   ? Diabetes mellitus   ? Diverticula, colon   ? Family history of anesthesia complication   ? Son is difficult intubation  ? Hyperlipidemia   ? Hypertension   ? Vasovagal syncope 2006  ? Negative cardiac workup-myoview, echo  ?  ?Past Surgical History:  ?Procedure Laterality Date  ? ABDOMINAL HYSTERECTOMY  1977  ? fibroid  ? JOINT REPLACEMENT  2009  ? rt hip  ? TOTAL HIP ARTHROPLASTY Left 05/12/2014  ? dr Mayer Camel  ? TOTAL HIP ARTHROPLASTY Left 05/12/2014  ? Procedure: TOTAL HIP ARTHROPLASTY;  Surgeon: Kerin Salen, MD;  Location: [facility];  Service: Orthopedics;  Laterality: Left;  ?  ?Social History  ? ?Socioeconomic History  ? Marital status: Widowed   ?  Spouse name: Not on file  ? Number of children: Not on file  ? Years of education: Not on file  ? Highest education level: Not on file  ?Occupational History  ? Not on file  ?Tobacco Use  ? Smoking status: Former  ?  Packs/day: 1.00  ?  Years: 45.00  ?  Pack years: 45.00  ?  Types: Cigarettes  ?  Quit date: 06/16/1999  ?  Years since quitting: 22.4  ? Smokeless tobacco: Never  ? Tobacco comments:  ?  quit 15 years ago  ?Substance and Sexual Activity  ? Alcohol use: No  ?  Alcohol/week: 0.0 standard drinks  ? Drug use: No  ? Sexual activity: Not Currently  ?Other Topics Concern  ? Not on file  ?Social History Narrative  ? 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 No exercsie. Diet: healthy diet  ?   ? Son Antony H[place]) [place] , has living will and DNR ( reviewed 2015)  ?   ?   ?   ?   ? ?Social Determinants of Health  ? ?Financial Resource Strain: Not on file  ?  Food Insecurity: Not on file  ?Transportation Needs: Not on file  ?Physical Activity: Not on file  ?Stress: Not on file  ?Social Connections: Not on file  ?Intimate Partner Violence: Not on file  ?  ?Family History  ?Problem Relation Age of Onset  ? COPD Brother   ? Heart disease Brother   ? Diabetes Brother   ? Lymphoma Brother   ? Alzheimer's disease Brother   ? Pancreatic cancer Sister   ? Alzheimer's disease Sister   ? Heart disease Mother   ? Breast cancer Sister   ? Leukemia Other   ? Kidney disease Neg Hx   ? ?Allergies  ?Allergen Reactions  ? Lovastatin Other (See Comments)  ?  REACTION: leg pain  ? Statins Other (See Comments)  ?  REACTION: leg cramps, weakness  ? Sulfa Antibiotics Hives and Itching  ? Cephalosporins   ? Codeine Rash  ? Niacin Rash  ?  Flushing also  ? ?I? ?Current Outpatient Medications  ?Medication Sig Dispense Refill  ?  acetaminophen (TYLENOL) 500 MG tablet Take 1,000 mg by mouth every 6 (six) hours as needed for moderate pain or headache.    ? albuterol (VENTOLIN HFA) 108 (90 Base) MCG/ACT inhaler Inhale 2 puffs into the lungs every 6 (six) hours as needed for wheezing or shortness of breath. 8 g 0  ? allopurinol (ZYLOPRIM) 100 MG tablet Take 0.5 tablets (50 mg total) by mouth every other day. 15 tablet 11  ? Cranberry 500 MG CAPS Take by mouth.    ? diclofenac Sodium (VOLTAREN) 1 % GEL Apply 2 g topically 4 (four) times daily as needed.    ? donepezil (ARICEPT) 10 MG tablet Take 1 tablet (10 mg total) by mouth at bedtime. 30 tablet 11  ? escitalopram (LEXAPRO) 20 MG tablet TAKE 1 TABLET BY MOUTH EVERY DAY 90 tablet 1  ? estradiol (ESTRACE VAGINAL) 0.1 MG/GM vaginal cream Apply 0.'5mg'$  (pea-sized amount)  just inside the vaginal introitus with a finger-tip on Monday, Wednesday and Friday nights. 30 g 12  ? hydrOXYzine (ATARAX/VISTARIL) 10 MG tablet TAKE 0.5-1 TABLETS (5-10 MG TOTAL) BY MOUTH DAILY AS NEEDED FOR ANXIETY. 90 tablet 0  ? loratadine (CLARITIN) 10 MG tablet Take by mouth.    ? Multiple Vitamins-Minerals (MULTIVITAMIN WITH MINERALS) tablet Take 1 tablet by mouth daily.    ? Probiotic Product (PROBIOTIC DAILY PO) Take by mouth.    ? TART CHERRY PO Take 1 tablet by mouth in the morning and at bedtime.    ? tiotropium (SPIRIVA HANDIHALER) 18 MCG inhalation capsule INHALE 1 CAPSULE VIA HANDIHALER ONCE DAILY AT THE SAME TIME EVERY DAY 30 capsule 5  ? vancomycin (VANCOCIN) 125 MG capsule Take 1 capsule (125 mg total) by mouth 4 (four) times daily for 10 days. 40 capsule 0  ? Vitamin D, Ergocalciferol, (DRISDOL) 1.25 MG (50000 UNIT) CAPS capsule Take 1 capsule (50,000 Units total) by mouth every Friday. 12 capsule 3  ? ?No current facility-administered medications for this visit.  ?  ? ?Abtx:  ?Anti-infectives (From admission, onward)  ? ? None  ? ?  ? ? ?REVIEW OF SYSTEMS:  ?Const: some fever, negative chills, weight  loss ?Eyes: negative diplopia or visual changes, negative eye pain ?ENT: negative coryza, negative sore throat ?Resp: negative cough, hemoptysis, dyspnea ?Cards: negative for chest pain, palpitations, lower extremity edema ?GU: negative for frequency, dysuria and hematuria ?GI: Has nausea and diarrhea which is improved since starting vancomycin 7 skin: negative for  rash and pruritus ?Heme: negative for easy bruising and gum/nose bleeding ?MS: General weakness ?Back pain and muscle weakness ?Neurolo:negative for headaches, dizziness, vertigo, memory problems  ?Psych: negative for feelings of anxiety, depression  ?Endocrine: negative for thyroid, diabetes ?Allergy/Immunology-as above ?Ambulates with walker ?Objective:  ?VITALS:  ?BP 134/77   Pulse 74   Temp 98.1 ?F (36.7 ?C) (Temporal)   Resp 16   Ht '5\' 4"'$  (1.626 m)   Wt 167 lb (75.8 kg) Comment: in person weight  SpO2 98%   BMI 28.67 kg/m?  ?PHYSICAL EXAM:  ?General: Alert, cooperative, no distress,   In wheelchair. ?Lungs: Clear to auscultation bilaterally. No Wheezing or Rhonchi. No rales. ?Heart: Regular rate and rhythm, no murmur, rub or gallop. ?Abdomen: Soft, non-tender,not distended. Bowel sounds normal. No masses ?Extremities: atraumatic, no cyanosis. No edema. No clubbing ?Skin: No rashes or lesions. Or bruising ?Lymph: Cervical, supraclavicular normal. ?Neurologic: Grossly non-focal ? ?No recent labs than 11/06/2021 ? ? ? ? ?? ?Impression/Recommendation ?? ?Recurrent C. difficile infection more than 4 in the last year. ?She was doing well since September 2022.  She took her course of doxycycline in January along with vancomycin.  Started to have diarrhea in March and was tested positive. ?She is currently on vancomycin. ?She is going to need fecal transplant ?I spoke with GI Dr. Vicente Males.  He will be able to do it in his office.  He will administer Rebyota which is of fecal microbiota given as a rectal suspension. ?She will continue with vancomycin until  she sees him.  The vancomycin will have to be stopped 24 hours to 72 hours before treatment. ?Discussed the management in great detail with the patient and her granddaughter. ?Reinforced not taking antibiotics for asy

## 2021-12-02 NOTE — Patient Instructions (Addendum)
You are here for recurrent cdif finfection- on 4/7 you restarted vanco PO '125mg'$  Q6- you need fecal transplant- Dr.kiran Jerrilyn [place] will do it or you with the product Rebyota. His office number is [phone]. I have already informed him and their office will call you. ?Follow up PRn ?

## 2021-12-09 ENCOUNTER — Ambulatory Visit: Payer: Medicare PPO | Admitting: Gastroenterology

## 2021-12-09 ENCOUNTER — Encounter: Payer: Self-pay | Admitting: Gastroenterology

## 2021-12-09 VITALS — BP 170/93 | HR 70 | Temp 97.7°F | Ht 64.0 in | Wt 166.0 lb

## 2021-12-09 DIAGNOSIS — A0471 Enterocolitis due to Clostridium difficile, recurrent: Secondary | ICD-10-CM

## 2021-12-09 NOTE — Progress Notes (Signed)
?  ?Cephas Darby, MD ?[address]  ?[address]  ?[place], [place] [address]  ?Main: [phone]  ?Fax: [phone] ? ? ? ?Gastroenterology Consultation ? ?Referring Provider:     Jinny Sanders, MD ?Primary Care Physician:  Jinny Sanders, MD ?Primary Gastroenterologist:  Dr. Cephas Darby ?Reason for Consultation: Recurrent C. difficile infection ?      ? HPI:   ?Jill [name] is a 88 y.o. female referred by Dr. Jinny Sanders, MD  for consultation & management of recurrent C. difficile diarrhea.  Patient has been experiencing recurrent episodes of C. difficile infection for past 1 year.  Her initial episode was in 10/2020, during that time she was hospitalized.  She developed first episode after she was treated with Rocephin and oral antibiotics for UTI.  Patient was treated with oral vancomycin along with Questran.  She had her first recurrence on 01/29/2021, again treated with oral vancomycin with prolonged taper.  Her diarrhea responded to oral vancomycin very well.  Patient had her second recurrence on 11/06/2021, again restarted oral vancomycin 125 mg 4 times daily for 14 days.  After finishing this course, her diarrhea recurred again, started her on another course of oral vancomycin.  Currently she is taking 125 mg 4 times daily of oral vancomycin.  Her diarrhea has resolved.  She reports having 1 soft bowel movement daily which is mucousy.  Denies any abdominal pain, rectal bleeding.  Her weight has been stable, appetite is improving.  Her granddaughter lives with her and takes care of her. ?Review of labs revealed mild leukocytosis, normocytic anemia based on labs from 10/2021, has mild CKD, serum albumin 3.5 ? ? ?NSAIDs: None ? ?Antiplts/Anticoagulants/Anti thrombotics: None ? ?GI Procedures: ?Colonoscopy 10/23/2002 ?ENDOSCOPIC BIOPSY, RECTAL POLYP: THREE FRAGMENTS OF  ? TUBULOVILLOUS ADENOMA,  ? NO EVIDENCE OF HIGH GRADE DYSPLASIA OR MALIGNANCY.  ? ?Past Medical History:  ?Diagnosis Date  ? Allergy    ? Arthritis   ? osteoarthritis   ? Chronic kidney disease   ? Complication of anesthesia   ? difficult waking   ? Depression   ? Diabetes mellitus   ? Diverticula, colon   ? Family history of anesthesia complication   ? Son is difficult intubation  ? Hyperlipidemia   ? Hypertension   ? Vasovagal syncope 2006  ? Negative cardiac workup-myoview, echo  ? ? ?Past Surgical History:  ?Procedure Laterality Date  ? ABDOMINAL HYSTERECTOMY  1977  ? fibroid  ? JOINT REPLACEMENT  2009  ? rt hip  ? TOTAL HIP ARTHROPLASTY Left 05/12/2014  ? dr Mayer Camel  ? TOTAL HIP ARTHROPLASTY Left 05/12/2014  ? Procedure: TOTAL HIP ARTHROPLASTY;  Surgeon: Kerin Salen, MD;  Location: [facility];  Service: Orthopedics;  Laterality: Left;  ? ?Current Outpatient Medications:  ?  acetaminophen (TYLENOL) 500 MG tablet, Take 1,000 mg by mouth every 6 (six) hours as needed for moderate pain or headache., Disp: , Rfl:  ?  albuterol (VENTOLIN HFA) 108 (90 Base) MCG/ACT inhaler, Inhale 2 puffs into the lungs every 6 (six) hours as needed for wheezing or shortness of breath., Disp: 8 g, Rfl: 0 ?  allopurinol (ZYLOPRIM) 100 MG tablet, Take 0.5 tablets (50 mg total) by mouth every other day., Disp: 15 tablet, Rfl: 11 ?  Cranberry 500 MG CAPS, Take by mouth., Disp: , Rfl:  ?  diclofenac Sodium (VOLTAREN) 1 % GEL, Apply 2 g topically 4 (four) times daily as needed., Disp: , Rfl:  ?  donepezil (ARICEPT) 10 MG tablet, Take 1 tablet (10 mg total) by mouth at bedtime., Disp: 30 tablet, Rfl: 11 ?  escitalopram (LEXAPRO) 20 MG tablet, TAKE 1 TABLET BY MOUTH EVERY DAY, Disp: 90 tablet, Rfl: 1 ?  estradiol (ESTRACE VAGINAL) 0.1 MG/GM vaginal cream, Apply 0.'5mg'$  (pea-sized amount)  just inside the vaginal introitus with a finger-tip on Monday, Wednesday and Friday nights., Disp: 30 g, Rfl: 12 ?  hydrOXYzine (ATARAX/VISTARIL) 10 MG tablet, TAKE 0.5-1 TABLETS (5-10 MG TOTAL) BY MOUTH DAILY AS NEEDED FOR ANXIETY., Disp: 90 tablet, Rfl: 0 ?  loratadine (CLARITIN) 10 MG tablet,  Take by mouth., Disp: , Rfl:  ?  Multiple Vitamins-Minerals (MULTIVITAMIN WITH MINERALS) tablet, Take 1 tablet by mouth daily., Disp: , Rfl:  ?  Probiotic Product (PROBIOTIC DAILY PO), Take by mouth., Disp: , Rfl:  ?  TART CHERRY PO, Take 1 tablet by mouth in the morning and at bedtime., Disp: , Rfl:  ?  tiotropium (SPIRIVA HANDIHALER) 18 MCG inhalation capsule, INHALE 1 CAPSULE VIA HANDIHALER ONCE DAILY AT THE SAME TIME EVERY DAY, Disp: 30 capsule, Rfl: 5 ?  vancomycin (VANCOCIN) 125 MG capsule, Take 1 capsule (125 mg total) by mouth 4 (four) times daily for 10 days., Disp: 56 capsule, Rfl: 0 ?  Vitamin D, Ergocalciferol, (DRISDOL) 1.25 MG (50000 UNIT) CAPS capsule, Take 1 capsule (50,000 Units total) by mouth every Friday., Disp: 12 capsule, Rfl: 3 ? ? ? ?Family History  ?Problem Relation Age of Onset  ? COPD Brother   ? Heart disease Brother   ? Diabetes Brother   ? Lymphoma Brother   ? Alzheimer's disease Brother   ? Pancreatic cancer Sister   ? Alzheimer's disease Sister   ? Heart disease Mother   ? Breast cancer Sister   ? Leukemia Other   ? Kidney disease Neg Hx   ?  ? ?Social History  ? ?Tobacco Use  ? Smoking status: Former  ?  Packs/day: 1.00  ?  Years: 45.00  ?  Pack years: 45.00  ?  Types: Cigarettes  ?  Quit date: 06/16/1999  ?  Years since quitting: 22.4  ? Smokeless tobacco: Never  ? Tobacco comments:  ?  quit 15 years ago  ?Substance Use Topics  ? Alcohol use: No  ?  Alcohol/week: 0.0 standard drinks  ? Drug use: No  ? ? ?Allergies as of 12/09/2021 - Review Complete 12/09/2021  ?Allergen Reaction Noted  ? Lovastatin Other (See Comments) 04/16/2007  ? Statins Other (See Comments) 08/30/2006  ? Sulfa antibiotics Hives and Itching 01/27/2015  ? Cephalosporins  01/19/2021  ? Codeine Rash 06/16/2011  ? Niacin Rash 04/16/2007  ? ? ?Review of Systems:    ?All systems reviewed and negative except where noted in HPI. ? ? Physical Exam:  ?BP (!) 170/93 (BP Location: Left Arm, Patient Position: Sitting, Cuff  Size: Normal)   Pulse 70   Temp 97.7 ?F (36.5 ?C) (Oral)   Ht '5\' 4"'$  (1.626 m)   Wt 166 lb (75.3 kg)   BMI 28.49 kg/m?  ?No LMP recorded. Patient has had a hysterectomy. ? ?General:   Alert,  Well-developed, well-nourished, pleasant and cooperative in NAD ?Head:  Normocephalic and atraumatic. ?Eyes:  Sclera clear, no icterus.   Conjunctiva pink. ?Ears:  Normal auditory acuity. ?Nose:  No deformity, discharge, or lesions. ?Mouth:  No deformity or lesions,oropharynx pink & moist. ?Neck:  Supple; no masses or thyromegaly. ?Lungs:  Respirations even and unlabored.  Clear throughout  to auscultation.   No wheezes, crackles, or rhonchi. No acute distress. ?Heart:  Regular rate and rhythm; no murmurs, clicks, rubs, or gallops. ?Abdomen:  Normal bowel sounds. Soft, non-tender and non-distended without masses, hepatosplenomegaly or hernias noted.  No guarding or rebound tenderness.   ?Rectal: Not performed ?Msk:  Symmetrical without gross deformities. Good, equal movement & strength bilaterally. ?Pulses:  Normal pulses noted. ?Extremities:  No clubbing or edema.  No cyanosis. ?Neurologic:  Alert and oriented x3;  grossly normal neurologically. ?Skin:  Intact without significant lesions or rashes. No jaundice. ?Psych:  Alert and cooperative. Normal mood and affect. ? ?Imaging Studies: ?Reviewed ? ?Assessment and Plan:  ? ?Jill [name] is a 88 y.o. female with history of CKD, is seen in consultation for recurrent C. difficile diarrhea ? ?Recurrent C. difficile diarrhea ?Patient is currently being treated with oral vancomycin 125 mg 4 times daily, continue the same for 10 to 14 days followed by gradual taper, 3 times a day for 2 weeks then 2 times a day for 2 weeks then once a day.  Patient will need to be off vancomycin at least for 48 hours prior to FMT ?Diarrhea has currently resolved ?Discussed with patient and her granddaughter regarding catheter-based FMT and both are agreeable.  Risks and benefits discussed and will  initiate paperwork today ? ?Follow up in 3 to 4 weeks and if MD is approved ? ? ?Cephas Darby, MD ? ?

## 2021-12-13 ENCOUNTER — Telehealth: Payer: Self-pay

## 2021-12-13 NOTE — Telephone Encounter (Signed)
[place] required a PA through cover my meds. Did PA on [employer] and Certificate Information ?Reference Number ?[id number] ? ?Status ?PENDED ? ?Review Reason 1 ?Requires Medical Review ? ?Message ?This case requires further review. You will be contacted if additional information is needed. ?

## 2021-12-15 NOTE — Telephone Encounter (Signed)
Look to check the status of the PA and the PA said it was canceled. [employer] and they said I had to talk to the drug department of the plan. There direct number is [phone]. Gave them the information and reference number is  [id number].  ? Fax clinicals to [phone] Attention EOC  ?

## 2021-12-16 ENCOUNTER — Ambulatory Visit: Payer: Medicare PPO | Admitting: Physician Assistant

## 2021-12-17 NOTE — Telephone Encounter (Signed)
Called and talk to a case manager Selena and she she documented this information and states to fax her this letter. Fax this letter to them at [phone]. She states patient has a 40 dollar copay and no dutiable has to be met.  ? ?Can you please order for 01/06/2022 ?

## 2021-12-17 NOTE — Telephone Encounter (Signed)
[employer] mediation Intake team fax back and states that no prior authorization is required for this medication under the patient [facility] medical benefits.  ?

## 2021-12-20 NOTE — Telephone Encounter (Signed)
Patient was accidentally schedule in [place] and not [place]. Called granddaughter and got her moved to 01/05/22 at 1:15pm. Informed grand daughter to stop antibiotics two days prior to procedure. She verbalized understanding of last does being on Sunday 01/02/22 ?

## 2021-12-30 ENCOUNTER — Telehealth: Payer: Self-pay

## 2021-12-30 NOTE — Telephone Encounter (Signed)
Called and left a detail message for patient grand daughter that she needed to stop the antibiotics two days prior to procedure.  ?

## 2021-12-30 NOTE — Telephone Encounter (Signed)
-----   Message from Shelby Mattocks, [place] sent at 12/17/2021  8:34 AM EDT ----- ?Remind patient to stop antibiotics 2 days prior to Stool transplant  ? ?

## 2022-01-05 ENCOUNTER — Encounter: Payer: Self-pay | Admitting: Gastroenterology

## 2022-01-05 ENCOUNTER — Ambulatory Visit (INDEPENDENT_AMBULATORY_CARE_PROVIDER_SITE_OTHER): Payer: Medicare PPO | Admitting: Gastroenterology

## 2022-01-05 VITALS — BP 173/85 | HR 77 | Temp 98.6°F | Ht 64.0 in | Wt 170.0 lb

## 2022-01-05 DIAGNOSIS — A498 Other bacterial infections of unspecified site: Secondary | ICD-10-CM | POA: Diagnosis not present

## 2022-01-05 NOTE — Progress Notes (Signed)
?  ?Jill Darby, MD ?[address]  ?[address]  ?[place], [place] [address]  ?Main: [phone]  ?Fax: [phone] ? ? ? ?Gastroenterology Consultation ? ?Referring Provider:     Jinny Sanders, MD ?Primary Care Physician:  Jinny Sanders, MD ?Primary Gastroenterologist:  Dr. Cephas [name] ?Reason for Consultation: Recurrent C. difficile infection ?      ? HPI:   ?Jill [name] is a 88 y.o. female referred by Dr. Jinny Sanders, MD  for consultation & management of recurrent C. difficile diarrhea.  Patient has been experiencing recurrent episodes of C. difficile infection for past 1 year.  Her initial episode was in 10/2020, during that time she was hospitalized.  She developed first episode after she was treated with Rocephin and oral antibiotics for UTI.  Patient was treated with oral vancomycin along with Questran.  She had her first recurrence on 01/29/2021, again treated with oral vancomycin with prolonged taper.  Her diarrhea responded to oral vancomycin very well.  Patient had her second recurrence on 11/06/2021, again restarted oral vancomycin 125 mg 4 times daily for 14 days.  After finishing this course, her diarrhea recurred again, started her on another course of oral vancomycin.  Currently she is taking 125 mg 4 times daily of oral vancomycin.  Her diarrhea has resolved.  She reports having 1 soft bowel movement daily which is mucousy.  Denies any abdominal pain, rectal bleeding.  Her weight has been stable, appetite is improving.  Her granddaughter lives with her and takes care of her. ?Review of labs revealed mild leukocytosis, normocytic anemia based on labs from 10/2021, has mild CKD, serum albumin 3.5 ? ?Follow-up visit 01/05/2022 ?Patient is here to undergo catheter-based fecal microbiota transplantation in office today.  Her diarrhea has currently resolved on vancomycin taper.  Her last dose of vancomycin was 2 days ago.  Patient is accompanied by her son today.  They did not have any  concerns today. ? ? ?NSAIDs: None ? ?Antiplts/Anticoagulants/Anti thrombotics: None ? ?GI Procedures: ?Colonoscopy 10/23/2002 ?ENDOSCOPIC BIOPSY, RECTAL POLYP: THREE FRAGMENTS OF  ? TUBULOVILLOUS ADENOMA,  ? NO EVIDENCE OF HIGH GRADE DYSPLASIA OR MALIGNANCY.  ? ?Past Medical History:  ?Diagnosis Date  ? Allergy   ? Arthritis   ? osteoarthritis   ? Chronic kidney disease   ? Complication of anesthesia   ? difficult waking   ? Depression   ? Diabetes mellitus   ? Diverticula, colon   ? Family history of anesthesia complication   ? Son is difficult intubation  ? Hyperlipidemia   ? Hypertension   ? Vasovagal syncope 2006  ? Negative cardiac workup-myoview, echo  ? ? ?Past Surgical History:  ?Procedure Laterality Date  ? ABDOMINAL HYSTERECTOMY  1977  ? fibroid  ? JOINT REPLACEMENT  2009  ? rt hip  ? TOTAL HIP ARTHROPLASTY Left 05/12/2014  ? dr Mayer Camel  ? TOTAL HIP ARTHROPLASTY Left 05/12/2014  ? Procedure: TOTAL HIP ARTHROPLASTY;  Surgeon: Kerin Salen, MD;  Location: [facility];  Service: Orthopedics;  Laterality: Left;  ? ?Current Outpatient Medications:  ?  acetaminophen (TYLENOL) 500 MG tablet, Take 1,000 mg by mouth every 6 (six) hours as needed for moderate pain or headache., Disp: , Rfl:  ?  albuterol (VENTOLIN HFA) 108 (90 Base) MCG/ACT inhaler, Inhale 2 puffs into the lungs every 6 (six) hours as needed for wheezing or shortness of breath., Disp: 8 g, Rfl: 0 ?  allopurinol (ZYLOPRIM) 100 MG tablet,  Take 0.5 tablets (50 mg total) by mouth every other day., Disp: 15 tablet, Rfl: 11 ?  Cranberry 500 MG CAPS, Take by mouth., Disp: , Rfl:  ?  diclofenac Sodium (VOLTAREN) 1 % GEL, Apply 2 g topically 4 (four) times daily as needed., Disp: , Rfl:  ?  donepezil (ARICEPT) 10 MG tablet, Take 1 tablet (10 mg total) by mouth at bedtime., Disp: 30 tablet, Rfl: 11 ?  escitalopram (LEXAPRO) 20 MG tablet, TAKE 1 TABLET BY MOUTH EVERY DAY, Disp: 90 tablet, Rfl: 1 ?  estradiol (ESTRACE VAGINAL) 0.1 MG/GM vaginal cream, Apply 0.'5mg'$   (pea-sized amount)  just inside the vaginal introitus with a finger-tip on Monday, Wednesday and Friday nights., Disp: 30 g, Rfl: 12 ?  hydrOXYzine (ATARAX/VISTARIL) 10 MG tablet, TAKE 0.5-1 TABLETS (5-10 MG TOTAL) BY MOUTH DAILY AS NEEDED FOR ANXIETY., Disp: 90 tablet, Rfl: 0 ?  loratadine (CLARITIN) 10 MG tablet, Take by mouth., Disp: , Rfl:  ?  Multiple Vitamins-Minerals (MULTIVITAMIN WITH MINERALS) tablet, Take 1 tablet by mouth daily., Disp: , Rfl:  ?  Probiotic Product (PROBIOTIC DAILY PO), Take by mouth., Disp: , Rfl:  ?  TART CHERRY PO, Take 1 tablet by mouth in the morning and at bedtime., Disp: , Rfl:  ?  tiotropium (SPIRIVA HANDIHALER) 18 MCG inhalation capsule, INHALE 1 CAPSULE VIA HANDIHALER ONCE DAILY AT THE SAME TIME EVERY DAY, Disp: 30 capsule, Rfl: 5 ?  Vitamin D, Ergocalciferol, (DRISDOL) 1.25 MG (50000 UNIT) CAPS capsule, Take 1 capsule (50,000 Units total) by mouth every Friday., Disp: 12 capsule, Rfl: 3 ? ? ? ?Family History  ?Problem Relation Age of Onset  ? COPD Brother   ? Heart disease Brother   ? Diabetes Brother   ? Lymphoma Brother   ? Alzheimer's disease Brother   ? Pancreatic cancer Sister   ? Alzheimer's disease Sister   ? Heart disease Mother   ? Breast cancer Sister   ? Leukemia Other   ? Kidney disease Neg Hx   ?  ? ?Social History  ? ?Tobacco Use  ? Smoking status: Former  ?  Packs/day: 1.00  ?  Years: 45.00  ?  Pack years: 45.00  ?  Types: Cigarettes  ?  Quit date: 06/16/1999  ?  Years since quitting: 22.5  ? Smokeless tobacco: Never  ? Tobacco comments:  ?  quit 15 years ago  ?Substance Use Topics  ? Alcohol use: No  ?  Alcohol/week: 0.0 standard drinks  ? Drug use: No  ? ? ?Allergies as of 01/05/2022 - Review Complete 01/05/2022  ?Allergen Reaction Noted  ? Lovastatin Other (See Comments) 04/16/2007  ? Statins Other (See Comments) 08/30/2006  ? Sulfa antibiotics Hives and Itching 01/27/2015  ? Cephalosporins  01/19/2021  ? Codeine Rash 06/16/2011  ? Niacin Rash 04/16/2007   ? ? ?Review of Systems:    ?All systems reviewed and negative except where noted in HPI. ? ? Physical Exam:  ?BP (!) 173/85 (BP Location: Left Arm, Patient Position: Sitting, Cuff Size: Normal)   Pulse 77   Temp 98.6 ?F (37 ?C) (Oral)   Ht '5\' 4"'$  (1.626 m)   Wt 170 lb (77.1 kg)   BMI 29.18 kg/m?  ?No LMP recorded. Patient has had a hysterectomy. ? ?General:   Alert,  Well-developed, well-nourished, pleasant and cooperative in NAD ?Head:  Normocephalic and atraumatic. ?Eyes:  Sclera clear, no icterus.   Conjunctiva pink. ?Ears:  Normal auditory acuity. ?Nose:  No deformity, discharge, or  lesions. ?Mouth:  No deformity or lesions,oropharynx pink & moist. ?Neck:  Supple; no masses or thyromegaly. ?Lungs:  Respirations even and unlabored.  Clear throughout to auscultation.   No wheezes, crackles, or rhonchi. No acute distress. ?Heart:  Regular rate and rhythm; no murmurs, clicks, rubs, or gallops. ?Abdomen:  Normal bowel sounds. Soft, non-tender and non-distended without masses, hepatosplenomegaly or hernias noted.  No guarding or rebound tenderness.   ?Rectal: Not performed ?Msk:  Symmetrical without gross deformities. Good, equal movement & strength bilaterally. ?Pulses:  Normal pulses noted. ?Extremities:  No clubbing or edema.  No cyanosis. ?Neurologic:  Alert and oriented x3;  grossly normal neurologically. ?Skin:  Intact without significant lesions or rashes. No jaundice. ?Psych:  Alert and cooperative. Normal mood and affect. ? ?Imaging Studies: ?Reviewed ? ?Assessment and Plan:  ? ?Jill [name] is a 88 y.o. female with history of CKD, is seen in consultation for recurrent C. difficile diarrhea.  Patient received oral vancomycin 125 mg 4 times daily with taper and her diarrhea has currently resolved.  She is here to undergo catheter-based FMT today to prevent recurrent C. difficile infections.  Patient's last dose of vancomycin was 48 hours ago ? ?Consent has been obtained for FMT.  Catheter-based FMT has  been administered in office today.   ?At the end of installation of donor stool, resulted in leakage as soon as the patient stood up from the table ? ?Follow up as needed ? ? ?Jill Darby, MD ? ?

## 2022-01-06 ENCOUNTER — Ambulatory Visit: Payer: Medicare PPO | Admitting: Gastroenterology

## 2022-01-11 ENCOUNTER — Telehealth: Payer: Self-pay | Admitting: Gastroenterology

## 2022-01-11 NOTE — Telephone Encounter (Signed)
Since fecal transplant she has been having diarrhea. She is having diarrhea 3 to 4 times a day. She is really weak and is not wanting to eat or drink. Does not have the c diff smell but it is a typical stool smell. Denies any blood in stool.

## 2022-01-11 NOTE — Telephone Encounter (Signed)
Pt is having severe bouts of diarrhea her daughter is concerned it started a few days after the stool transplant. She would like a call back.

## 2022-01-11 NOTE — Telephone Encounter (Signed)
Unfortunately, looks like the stool transplant did not work as most of the donor stool has leaked during installation. Recommend Dificid 200 mg p.o. twice daily for 10 days followed by once a day for 2 weeks then every other day for 2 weeks  RV

## 2022-01-11 NOTE — Telephone Encounter (Signed)
Called and left a message for call back  

## 2022-01-12 MED ORDER — DIFICID 200 MG PO TABS
ORAL_TABLET | ORAL | 0 refills | Status: AC
Start: 2022-01-12 — End: 2022-02-19

## 2022-01-12 NOTE — Telephone Encounter (Signed)
Patient granddaughter verbalized understanding of results. Sent medication to the pharmacy

## 2022-01-12 NOTE — Addendum Note (Signed)
Addended by: Ulyess Blossom L on: 01/12/2022 08:18 AM   Modules accepted: Orders

## 2022-01-18 ENCOUNTER — Other Ambulatory Visit: Payer: Self-pay | Admitting: Family Medicine

## 2022-01-18 NOTE — Telephone Encounter (Signed)
Please schedule Diabetes follow up with Dr. Diona Browner.

## 2022-01-19 NOTE — Telephone Encounter (Signed)
LVM for a call back

## 2022-01-31 ENCOUNTER — Ambulatory Visit: Payer: Medicare PPO | Admitting: Gastroenterology

## 2022-02-09 ENCOUNTER — Telehealth: Payer: Self-pay

## 2022-02-09 ENCOUNTER — Other Ambulatory Visit: Payer: Self-pay | Admitting: Family Medicine

## 2022-02-09 NOTE — Telephone Encounter (Signed)
Patients granddaughter [name] has requested Difficid to be sent to the pharmacy.  She said her grandmother has started taking it every other day, but she's going to run out because the quantity was not enough.  She has asked to be called back.  Thanks,  [place], [place]

## 2022-02-09 NOTE — Telephone Encounter (Signed)
Called granddaughter and she states the pharmacy only gave them 34 tablets. Informed them that it was called in for 41 tablets. Called the pharmacy and they can get the 7 tablets. They will call and get it refilled

## 2022-02-15 ENCOUNTER — Ambulatory Visit: Payer: Medicare PPO | Admitting: Family Medicine

## 2022-02-15 ENCOUNTER — Encounter: Payer: Self-pay | Admitting: Family Medicine

## 2022-02-15 VITALS — BP 122/62 | HR 73 | Temp 98.5°F | Ht 62.0 in | Wt 170.1 lb

## 2022-02-15 DIAGNOSIS — N183 Chronic kidney disease, stage 3 unspecified: Secondary | ICD-10-CM

## 2022-02-15 DIAGNOSIS — E1159 Type 2 diabetes mellitus with other circulatory complications: Secondary | ICD-10-CM | POA: Diagnosis not present

## 2022-02-15 DIAGNOSIS — I152 Hypertension secondary to endocrine disorders: Secondary | ICD-10-CM | POA: Diagnosis not present

## 2022-02-15 DIAGNOSIS — E1122 Type 2 diabetes mellitus with diabetic chronic kidney disease: Secondary | ICD-10-CM | POA: Diagnosis not present

## 2022-02-15 DIAGNOSIS — E1140 Type 2 diabetes mellitus with diabetic neuropathy, unspecified: Secondary | ICD-10-CM

## 2022-02-15 DIAGNOSIS — A0472 Enterocolitis due to Clostridium difficile, not specified as recurrent: Secondary | ICD-10-CM

## 2022-02-15 LAB — POCT GLYCOSYLATED HEMOGLOBIN (HGB A1C): Hemoglobin A1C: 7.1 % — AB (ref 4.0–5.6)

## 2022-02-15 NOTE — Progress Notes (Signed)
Patient ID: Jill [name], female    DOB: 1932-05-28, 88 y.o.   MRN: [id number]  This visit was conducted in person.  BP 122/62   Pulse 73   Temp 98.5 F (36.9 C) (Oral)   Ht '5\' 2"'$  (1.575 m)   Wt 170 lb 2 oz (77.2 kg)   SpO2 98%   BMI 31.12 kg/m    CC:  Chief Complaint  Patient presents with   Diabetes    Subjective:   HPI: Jill [name] is a 88 y.o. female presenting on 02/15/2022 for Diabetes    Recurrent C. difficile followed by GI.  Fecal Matter transfer was unsuccessful.  She is completing  Difficid taper.  Now  BMs are normal.   Good appetite.  Wt Readings from Last 3 Encounters:  02/15/22 170 lb 2 oz (77.2 kg)  01/05/22 170 lb (77.1 kg)  12/09/21 166 lb (75.3 kg)    Diabetes:   Diet controlled. Lab Results  Component Value Date   HGBA1C 7.1 (A) 02/15/2022  Using medications without difficulties: Hypoglycemic episodes: Hyperglycemic episodes: Feet problems: Blood Sugars averaging: eye exam within last year:   Hypertension:    Now at goal on no medication. BP Readings from Last 3 Encounters:  02/15/22 122/62  01/05/22 (!) 173/85  12/09/21 (!) 170/93  Using medication without problems or lightheadedness: none Chest pain with exertion: none Edema:none Short of breath: none Average home BPs: Other issues:      Relevant past medical, surgical, family and social history reviewed and updated as indicated. Interim medical history since our last visit reviewed. Allergies and medications reviewed and updated. Outpatient Medications Prior to Visit  Medication Sig Dispense Refill   acetaminophen (TYLENOL) 500 MG tablet Take 1,000 mg by mouth every 6 (six) hours as needed for moderate pain or headache.     albuterol (VENTOLIN HFA) 108 (90 Base) MCG/ACT inhaler Inhale 2 puffs into the lungs every 6 (six) hours as needed for wheezing or shortness of breath. 8 g 0   allopurinol (ZYLOPRIM) 100 MG tablet Take 0.5 tablets (50 mg total) by mouth every other  day. 15 tablet 11   Cranberry 500 MG CAPS Take by mouth.     diclofenac Sodium (VOLTAREN) 1 % GEL Apply 2 g topically 4 (four) times daily as needed.     donepezil (ARICEPT) 10 MG tablet Take 1 tablet (10 mg total) by mouth at bedtime. 30 tablet 11   escitalopram (LEXAPRO) 20 MG tablet TAKE 1 TABLET BY MOUTH EVERY DAY 90 tablet 0   estradiol (ESTRACE VAGINAL) 0.1 MG/GM vaginal cream Apply 0.'5mg'$  (pea-sized amount)  just inside the vaginal introitus with a finger-tip on Monday, Wednesday and Friday nights. 30 g 12   fidaxomicin (DIFICID) 200 MG TABS tablet Take 1 tablet (200 mg total) by mouth 2 (two) times daily for 10 days, THEN 1 tablet (200 mg total) daily for 14 days, THEN 1 tablet (200 mg total) every other day for 14 days. 41 tablet 0   hydrOXYzine (ATARAX/VISTARIL) 10 MG tablet TAKE 0.5-1 TABLETS (5-10 MG TOTAL) BY MOUTH DAILY AS NEEDED FOR ANXIETY. 90 tablet 0   loratadine (CLARITIN) 10 MG tablet Take by mouth.     Multiple Vitamins-Minerals (MULTIVITAMIN WITH MINERALS) tablet Take 1 tablet by mouth daily.     Probiotic Product (PROBIOTIC DAILY PO) Take by mouth.     TART CHERRY PO Take 1 tablet by mouth in the morning and at bedtime.  tiotropium (SPIRIVA HANDIHALER) 18 MCG inhalation capsule INHALE 1 CAPSULE VIA HANDIHALER ONCE DAILY AT THE SAME TIME EVERY DAY 30 capsule 5   Vitamin D, Ergocalciferol, (DRISDOL) 1.25 MG (50000 UNIT) CAPS capsule Take 1 capsule (50,000 Units total) by mouth every Friday. 12 capsule 3   No facility-administered medications prior to visit.     Per HPI unless specifically indicated in ROS section below Review of Systems  Constitutional:  Negative for fatigue and fever.  HENT:  Negative for congestion.   Eyes:  Negative for pain.  Respiratory:  Negative for cough and shortness of breath.   Cardiovascular:  Negative for chest pain, palpitations and leg swelling.  Gastrointestinal:  Negative for abdominal pain.  Genitourinary:  Negative for dysuria and  vaginal bleeding.  Musculoskeletal:  Negative for back pain.  Neurological:  Negative for syncope, light-headedness and headaches.  Psychiatric/Behavioral:  Negative for dysphoric mood.    Objective:  BP 122/62   Pulse 73   Temp 98.5 F (36.9 C) (Oral)   Ht '5\' 2"'$  (1.575 m)   Wt 170 lb 2 oz (77.2 kg)   SpO2 98%   BMI 31.12 kg/m   Wt Readings from Last 3 Encounters:  02/15/22 170 lb 2 oz (77.2 kg)  01/05/22 170 lb (77.1 kg)  12/09/21 166 lb (75.3 kg)      Physical Exam Constitutional:      General: She is not in acute distress.    Appearance: Normal appearance. She is well-developed. She is not ill-appearing or toxic-appearing.  HENT:     Head: Normocephalic.     Right Ear: Hearing, tympanic membrane, ear canal and external ear normal. Tympanic membrane is not erythematous, retracted or bulging.     Left Ear: Hearing, tympanic membrane, ear canal and external ear normal. Tympanic membrane is not erythematous, retracted or bulging.     Nose: No mucosal edema or rhinorrhea.     Right Sinus: No maxillary sinus tenderness or frontal sinus tenderness.     Left Sinus: No maxillary sinus tenderness or frontal sinus tenderness.     Mouth/Throat:     Pharynx: Uvula midline.  Eyes:     General: Lids are normal. Lids are everted, no foreign bodies appreciated.     Conjunctiva/sclera: Conjunctivae normal.     Pupils: Pupils are equal, round, and reactive to light.  Neck:     Thyroid: No thyroid mass or thyromegaly.     Vascular: No carotid bruit.     Trachea: Trachea normal.  Cardiovascular:     Rate and Rhythm: Normal rate and regular rhythm.     Pulses: Normal pulses.     Heart sounds: Normal heart sounds, S1 normal and S2 normal. No murmur heard.    No friction rub. No gallop.  Pulmonary:     Effort: Pulmonary effort is normal. No tachypnea or respiratory distress.     Breath sounds: Normal breath sounds. No decreased breath sounds, wheezing, rhonchi or rales.  Abdominal:      General: Bowel sounds are normal.     Palpations: Abdomen is soft.     Tenderness: There is no abdominal tenderness.  Musculoskeletal:     Cervical back: Normal range of motion and neck supple.  Skin:    General: Skin is warm and dry.     Findings: No rash.  Neurological:     Mental Status: She is alert.  Psychiatric:        Mood and Affect: Mood is not anxious or  depressed.        Speech: Speech normal.        Behavior: Behavior normal. Behavior is cooperative.        Thought Content: Thought content normal.        Judgment: Judgment normal.       Results for orders placed or performed in visit on 02/15/22  POCT glycosylated hemoglobin (Hb A1C)  Result Value Ref Range   Hemoglobin A1C 7.1 (A) 4.0 - 5.6 %   HbA1c POC (<> result, manual entry)     HbA1c, POC (prediabetic range)     HbA1c, POC (controlled diabetic range)       COVID 19 screen:  No recent travel or known exposure to COVID19 The patient denies respiratory symptoms of COVID 19 at this time. The importance of social distancing was discussed today.   Assessment and Plan    Problem List Items Addressed This Visit     C. difficile colitis    Recurrent C. difficile followed by GI.  Fecal Matter transfer was unsuccessful.  She is completing  Difficid taper.  Now  BMs are normal.       Hypertension associated with diabetes ([place])    Chronic, Now at goal on no medication.      Type 2 diabetes mellitus with diabetic neuropathy, unspecified (HCC) - Primary   Relevant Orders   POCT glycosylated hemoglobin (Hb A1C) (Completed)   Type 2 diabetes mellitus with stage 3 chronic kidney disease, without long-term current use of insulin (HCC)    Diet, controlled.        Eliezer Lofts, MD

## 2022-03-16 ENCOUNTER — Other Ambulatory Visit: Payer: Self-pay

## 2022-03-16 NOTE — Patient Outreach (Signed)
[facility]) Care Management  03/16/2022  Jill [name] Dec 10, 1931 [id number]   Telephone Screen    Outreach call to patient to introduce [facility] services and assess care needs as part of benefit of PCP office and insurance plan. No answer. RN CM left HIPAA compliant voicemail message along with contact info.      Plan: RN CM will make outreach within 4 business days if no return call.   Enzo Montgomery, RN,BSN,CCM [facility] Telephonic Care Management Coordinator Direct Phone: [phone] Toll Free: [phone] Fax: [phone]

## 2022-03-21 ENCOUNTER — Other Ambulatory Visit: Payer: Self-pay

## 2022-03-21 NOTE — Patient Outreach (Signed)
[facility]) Care Management  03/21/2022  Jill [name] Jul 17, 1932 [id number]   Telephone Screen       Outreach call to patient to introduce [facility] services and assess care needs as part of benefit of PCP office and insurance plan.Spoke with granddaughter-Kristen who manages patient's medical affairs and with whom patient lives with.    Main healthcare issue/concern today: None. Caregiver voices that patient is doing well considering her age  Health Maintenance/Care Gaps: Shingles Vaccine  Conditions: Per chart review, patient has PMH that includes but not limited to DM(diet controlled), CKD, COPD and mild dementia.    Medications Reviewed Today     Reviewed by Hayden Pedro, RN (Registered Nurse) on 03/21/22 at [place] List Status: <None>   Medication Order Taking? Sig Documenting Provider Last Dose Status Informant  acetaminophen (TYLENOL) 500 MG tablet [id number] No Take 1,000 mg by mouth every 6 (six) hours as needed for moderate pain or headache. [provider] Taking Active Family Member  albuterol (VENTOLIN HFA) 108 (90 Base) MCG/ACT inhaler [id number] No Inhale 2 puffs into the lungs every 6 (six) hours as needed for wheezing or shortness of breath. Michela Pitcher, NP Taking Active   allopurinol (ZYLOPRIM) 100 MG tablet [id number] No Take 0.5 tablets (50 mg total) by mouth every other day. Jinny Sanders, MD Taking Active   Cranberry 500 MG CAPS [id number] No Take by mouth. [provider] Taking Active   diclofenac Sodium (VOLTAREN) 1 % GEL [id number] No Apply 2 g topically 4 (four) times daily as needed. [provider] Taking Active   donepezil (ARICEPT) 10 MG tablet [id number] No Take 1 tablet (10 mg total) by mouth at bedtime. Jinny Sanders, MD Taking Active   escitalopram (LEXAPRO) 20 MG tablet [id number] No TAKE 1 TABLET BY MOUTH EVERY DAY Bedsole, Amy E, MD Taking Active   estradiol (ESTRACE VAGINAL) 0.1 MG/GM  vaginal cream [id number] No Apply 0.'5mg'$  (pea-sized amount)  just inside the vaginal introitus with a finger-tip on Monday, Wednesday and Friday nights. Zara Council A, PA-C Taking Active   hydrOXYzine (ATARAX/VISTARIL) 10 MG tablet [id number] No TAKE 0.5-1 TABLETS (5-10 MG TOTAL) BY MOUTH DAILY AS NEEDED FOR ANXIETY. Jinny Sanders, MD Taking Active   loratadine (CLARITIN) 10 MG tablet [id number] No Take by mouth. [provider] Taking Active   Multiple Vitamins-Minerals (MULTIVITAMIN WITH MINERALS) tablet [id number] No Take 1 tablet by mouth daily. [provider] Taking Active Family Member  Probiotic Product (PROBIOTIC DAILY PO) [id number] No Take by mouth. [provider] Taking Active   TART CHERRY PO [id number] No Take 1 tablet by mouth in the morning and at bedtime. [provider] Taking Active Family Member  tiotropium (SPIRIVA HANDIHALER) 18 MCG inhalation capsule [id number] No INHALE 1 CAPSULE VIA HANDIHALER ONCE DAILY AT THE SAME TIME EVERY DAY Bedsole, Amy E, MD Taking Active   Vitamin D, Ergocalciferol, (DRISDOL) 1.25 MG (50000 UNIT) CAPS capsule [id number] No Take 1 capsule (50,000 Units total) by mouth every Friday. Jinny Sanders, MD Taking Active   Med List Note Dessie Coma, CPhT 01/19/21 1752): Shawniece Oyola is the patient's granddaughter @ [phone] home [place] [phone] cell.               03/21/2022   10:20 AM 02/15/2022    4:12 PM 12/02/2021   10:59 AM 08/13/2019    2:02 PM 08/08/2018    1:55 PM  Depression screen PHQ 2/9  Decreased  Interest 0 0 0 0 0  Down, Depressed, Hopeless 0 0 0 0 0  PHQ - 2 Score 0 0 0 0 0  Altered sleeping  1  0 0  Tired, decreased energy  3  0 0  Change in appetite  0  0 0  Feeling bad or failure about yourself   0  0 0  Trouble concentrating  0  0 0  Moving slowly or fidgety/restless  0  0 0  Suicidal thoughts  0  0 0  PHQ-9 Score  4  0 0  Difficult doing work/chores  Somewhat difficult  Not  difficult at all Not difficult at all       08/26/2021    1:36 PM 09/01/2021    2:11 PM 11/06/2021    2:07 PM 12/02/2021   10:59 AM 03/21/2022   10:21 AM  Fall Risk  Falls in the past year?    1 1  Was there an injury with Fall?    1 0  Fall Risk Category Calculator    3 1  Fall Risk Category    High Low  Patient Fall Risk Level Low fall risk Low fall risk Moderate fall risk  Moderate fall risk  Patient at Risk for Falls Due to     History of fall(s);Impaired balance/gait  Fall risk Follow up     Falls evaluation completed;Education provided    SDOH Screenings   Alcohol Screen: Low Risk  (08/13/2019)   Alcohol Screen    Last Alcohol Screening Score (AUDIT): 0  Depression (PHQ2-9): Low Risk  (03/21/2022)   Depression (PHQ2-9)    PHQ-2 Score: 0  Financial Resource Strain: Low Risk  (08/13/2019)   Overall Financial Resource Strain (CARDIA)    Difficulty of Paying Living Expenses: Not hard at all  Food Insecurity: No Food Insecurity (03/21/2022)   Hunger Vital Sign    Worried About Running Out of Food in the Last Year: Never true    [place] in the Last Year: Never true  Housing: Low Risk  (08/13/2019)   Housing    Last Housing Risk Score: 0  Physical Activity: Inactive (08/13/2019)   Exercise Vital Sign    Days of Exercise per Week: 0 days    Minutes of Exercise per Session: 0 min  Social Connections: Not on file  Stress: No Stress Concern Present (08/13/2019)   [place]    Feeling of Stress : Not at all  Tobacco Use: Medium Risk (03/21/2022)   Patient History    Smoking Tobacco Use: Former    Smokeless Tobacco Use: Never    Passive Exposure: Not on file  Transportation Needs: No Transportation Needs (03/21/2022)   PRAPARE - Hydrologist (Medical): No    Lack of Transportation (Non-Medical): No      Care Plan : Consulting civil engineer POC  Updates made by Hayden Pedro, RN since 03/21/2022 12:00 AM     Problem: Chronic Disease Mgmt of Chronic Condition-DM, COPD   Priority: High     Long-Range Goal: Development of POC for Mgmt of Chronic Condition-   Start Date: 03/21/2022  Expected End Date: 03/22/2023  Priority: High  Note:   Current Barriers:  Chronic Disease Management support and education needs related to COPD and DMII   RNCM Clinical Goal(s):  Patient will verbalize understanding of plan for management of COPD and DMII as evidenced by  mgmt of chronic conditions demonstrate Ongoing adherence to prescribed treatment plan for DMII as evidenced by A1C WNL  through collaboration with RN Care manager, provider, and care team.   Interventions: 1:1 collaboration with primary care provider regarding development and update of comprehensive plan of care as evidenced by provider attestation and co-signature Inter-disciplinary care team collaboration (see longitudinal plan of care) Evaluation of current treatment plan related to  self management and patient's adherence to plan as established by provider   COPD Interventions:  (Status:  New goal.) Long Term Goal Provided instruction about proper use of medications used for management of COPD including inhalers   Diabetes Interventions:  (Status:  New goal.) Long Term Goal Assessed patient's understanding of A1c goal: <7% Counseled on importance of regular laboratory monitoring as prescribed Discussed plans with patient for ongoing care management follow up and provided patient with direct contact information for care management team Lab Results  Component Value Date   HGBA1C 7.1 (A) 02/15/2022   Patient Goals/Self-Care Activities: Take all medications as prescribed Attend all scheduled provider appointments Call provider office for new concerns or questions   Follow Up Plan:  Telephone follow up appointment with care management team member scheduled for:  within the month of Oct The patient  has been provided with contact information for the care management team and has been advised to call with any health related questions or concerns.      Consent: [facility] services reviewed and discussed with caregiver. Verbal consent for services given.   Plan: RN CM discussed with caregiver next outreach within . Caregiver agrees to care plan and follow up. RN CM will send barriers letter and route encounter to PCP. RN CM will send welcome letter to patient.  Enzo Montgomery, RN,BSN,CCM [place] Management Telephonic Care Management Coordinator Direct Phone: [phone] Toll Free: [phone] Fax: [phone]

## 2022-03-22 DIAGNOSIS — A0472 Enterocolitis due to Clostridium difficile, not specified as recurrent: Secondary | ICD-10-CM | POA: Insufficient documentation

## 2022-03-22 IMAGING — DX DG CHEST 2V
2 series · 2 of 2 positions shown · non-contrast
Comparison: 08/26/2021

CLINICAL DATA: Shortness of breath.  Cough.

EXAM:
CHEST - 2 VIEW

[x chest ap]
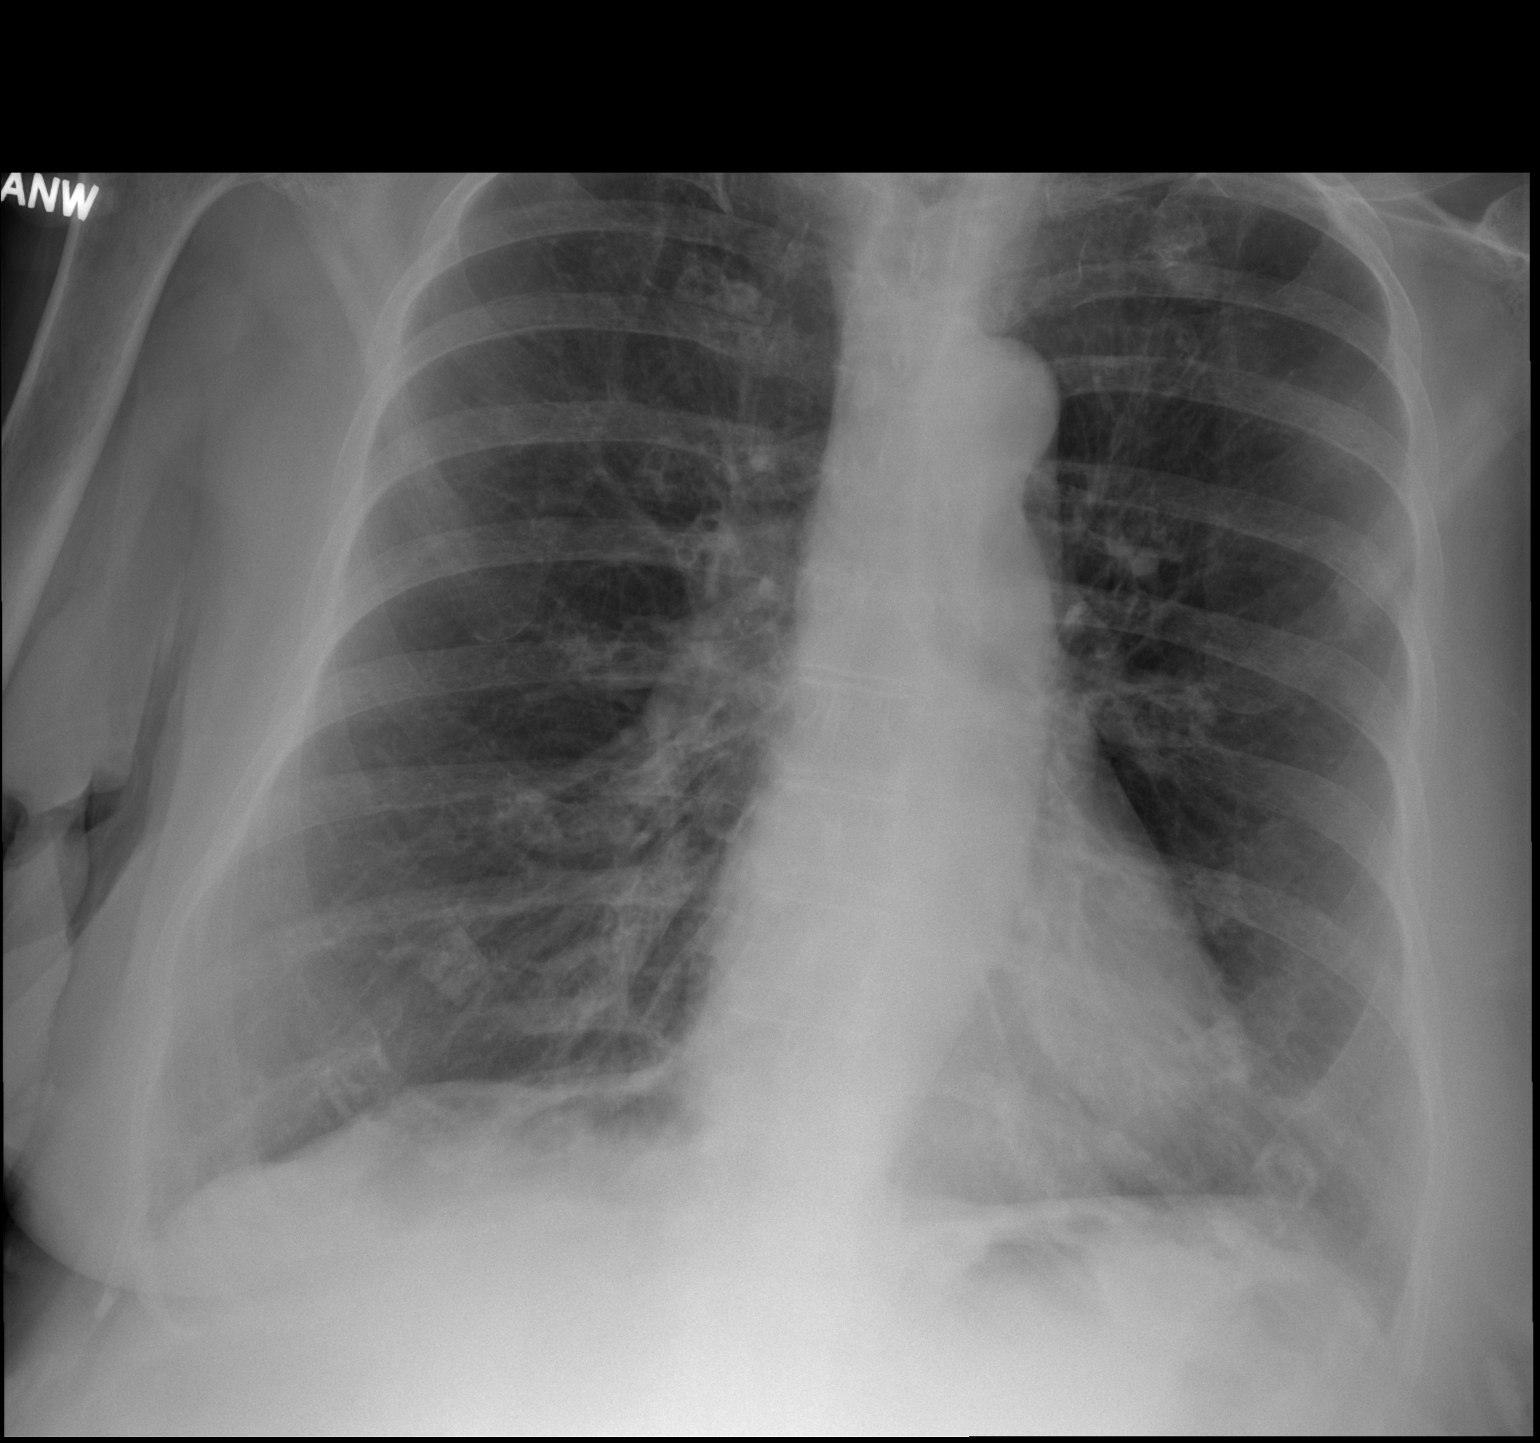

[w chest lat]
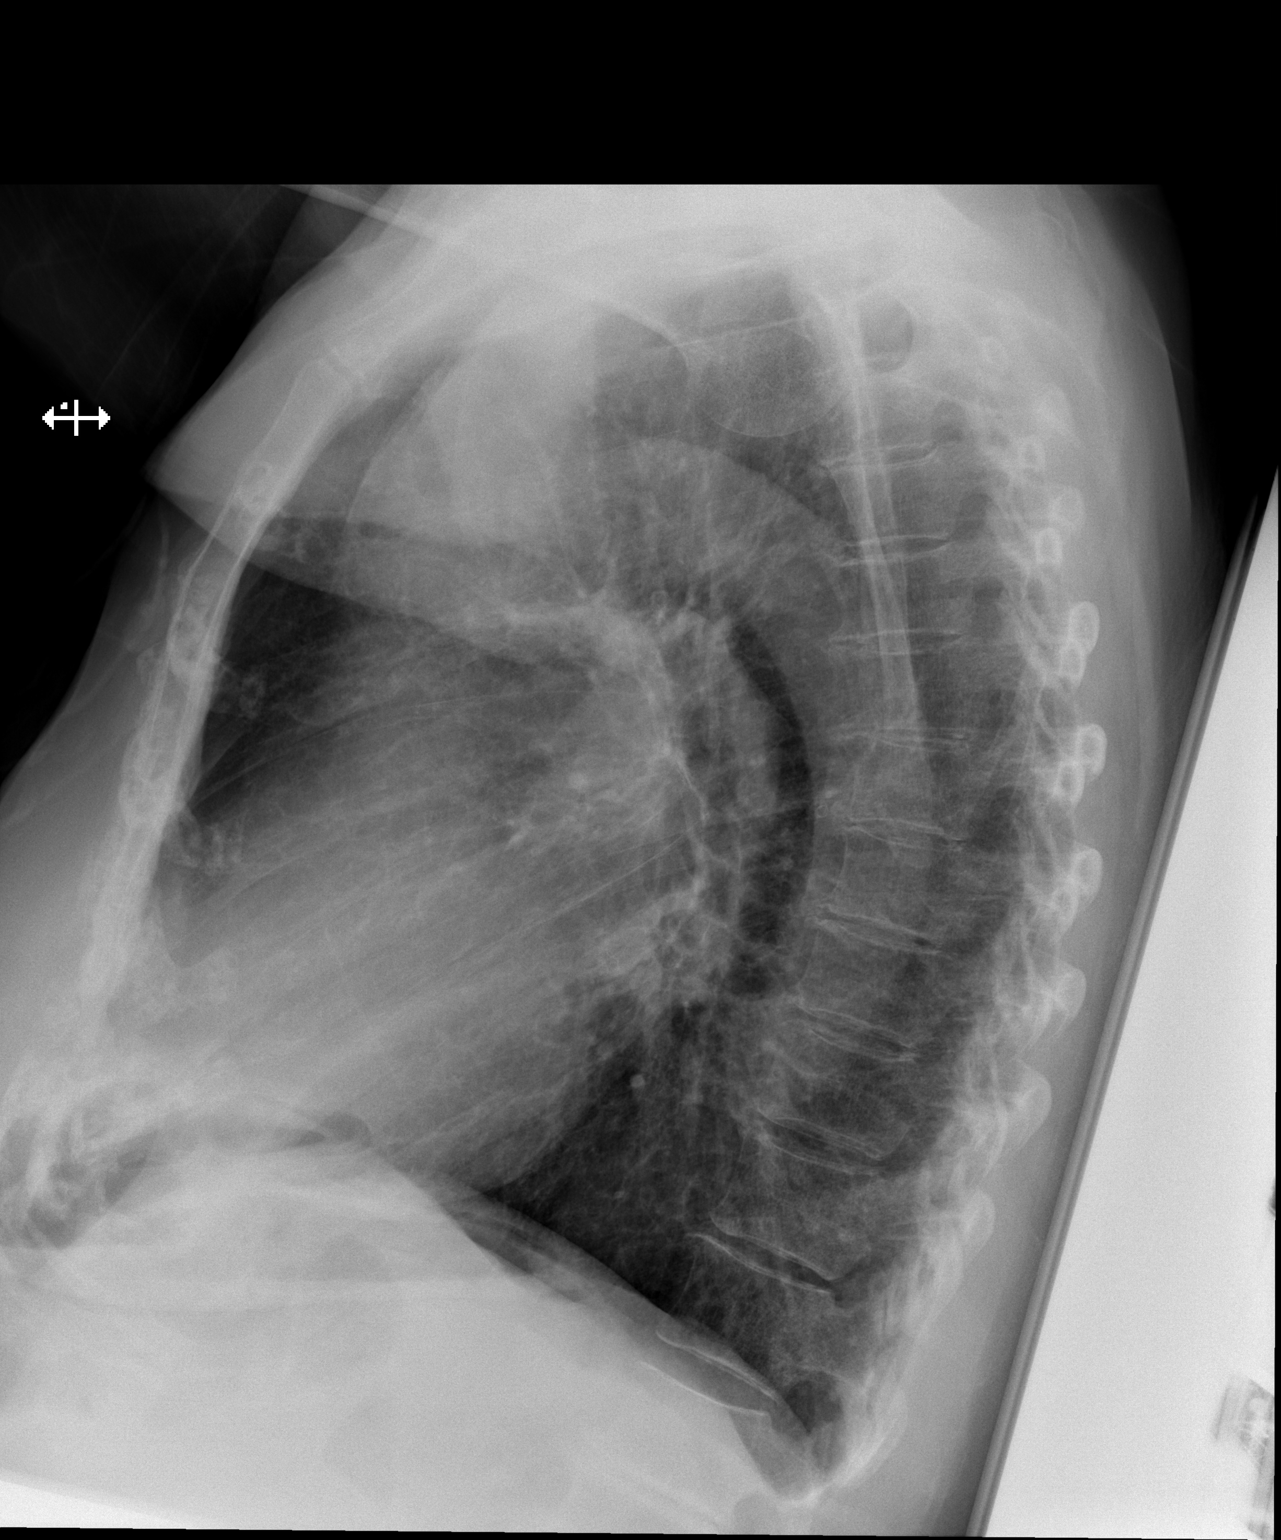

[2 of 2 positions shown; findings below may reference images not displayed]

FINDINGS: Lungs are clear without airspace disease or lung consolidation. No
pleural effusions. Heart and mediastinum are within normal limits.
Trachea is midline. Negative for a pneumothorax. No acute bone
abnormality.
IMPRESSION: No active cardiopulmonary disease.

## 2022-03-22 NOTE — Assessment & Plan Note (Signed)
Diet, controlled.

## 2022-03-22 NOTE — Assessment & Plan Note (Signed)
Chronic, Now at goal on no medication. 

## 2022-03-22 NOTE — Assessment & Plan Note (Signed)
Recurrent C. difficile followed by GI.  Fecal Matter transfer was unsuccessful.  She is completing  Difficid taper.  Now  BMs are normal.

## 2022-03-23 IMAGING — CT CT CHEST W/O CM
2 of 4 series · 13 of 36 positions shown, 16 images · non-contrast
Comparison: Chest radiograph 09/01/2021
COMPARISON: Chest radiograph 09/01/2021

Addendum:
CLINICAL DATA: Pneumonia, complication suspected, xray done
Shortness of breath that is progressing. Several cxr that are
normal. Suspect PNA. Already on doxycycline with out improvement



[Series 2: chest 2.00 · axial · 0.56mm/px · z∈[-1190,-924]mm · 10 of 159 slices shown, 13 images]
[im 13/159  mediastinal]
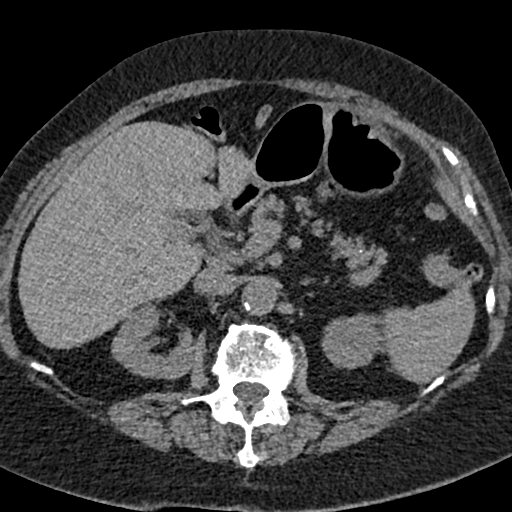
[im 13/159  lung]
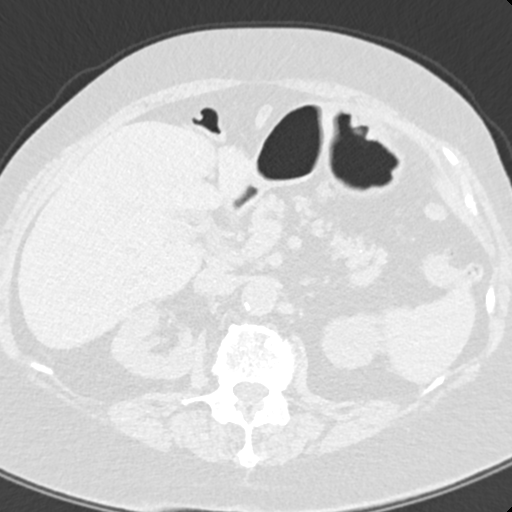
[im 25/159  lung]
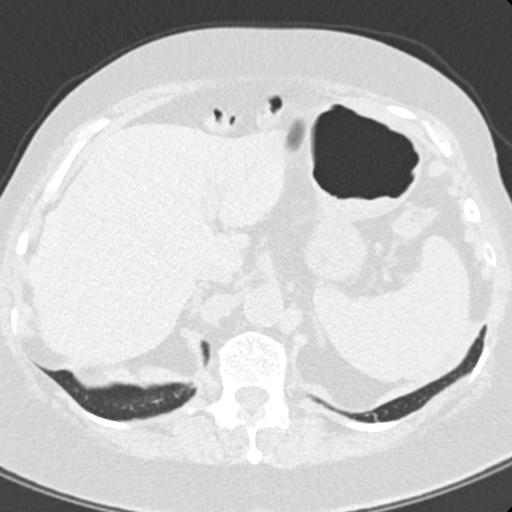
[im 49/159  lung]
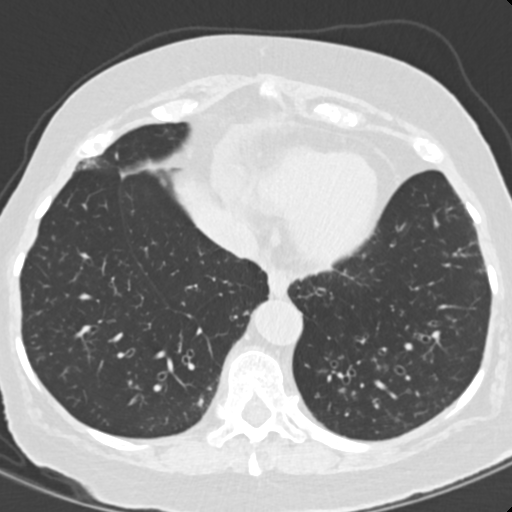
[im 61/159  lung]
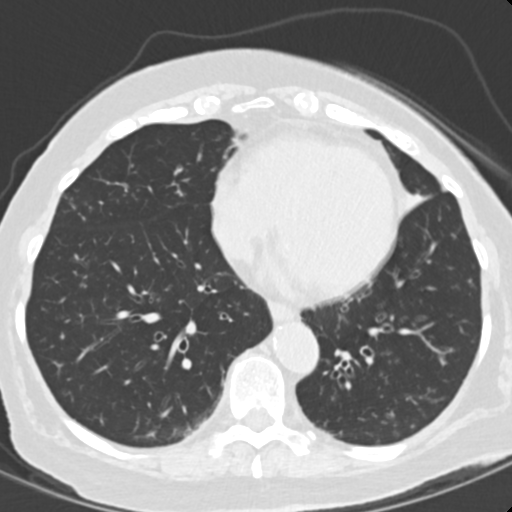
[im 73/159  mediastinal]
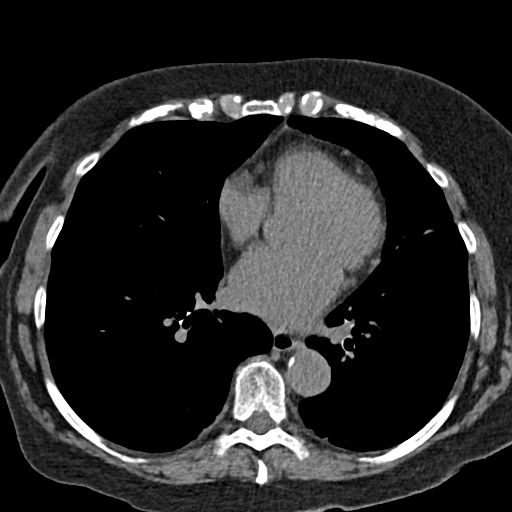
[im 73/159  lung]
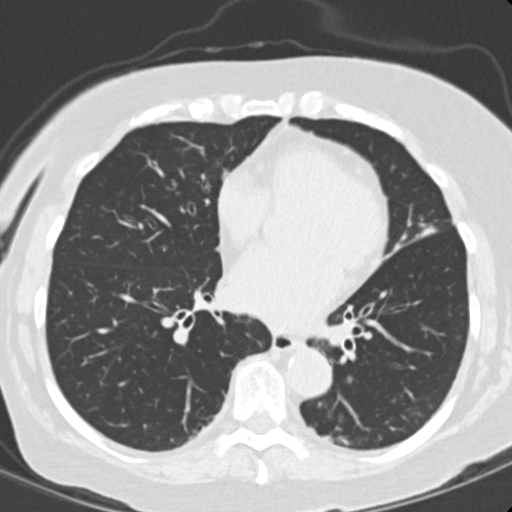
[im 86/159  lung]
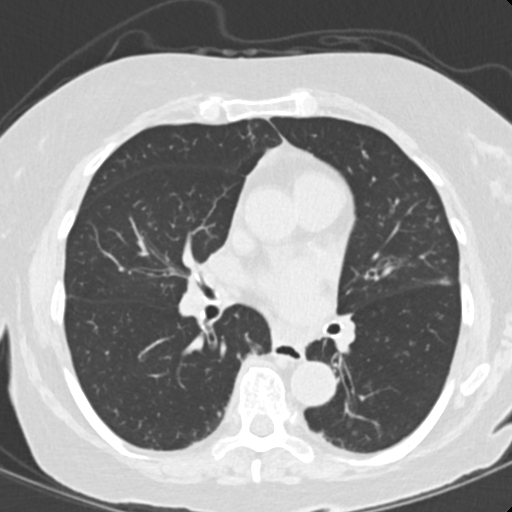
[im 98/159  lung]
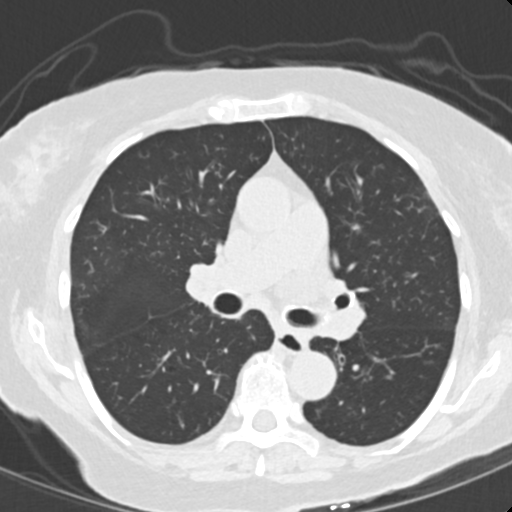
[im 122/159  lung]
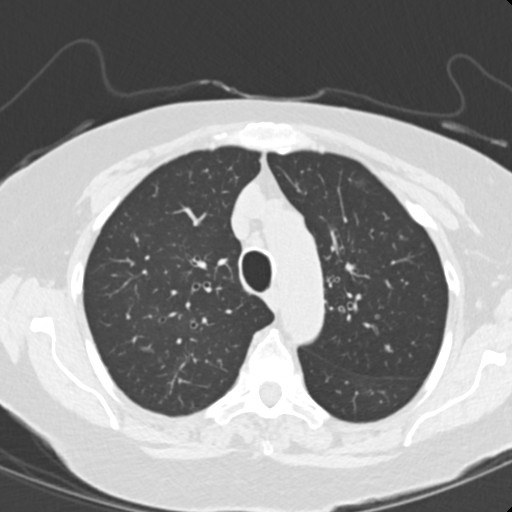
[im 134/159  mediastinal]
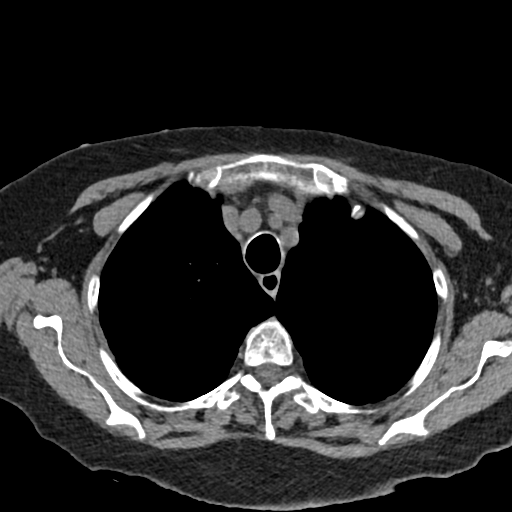
[im 134/159  lung]
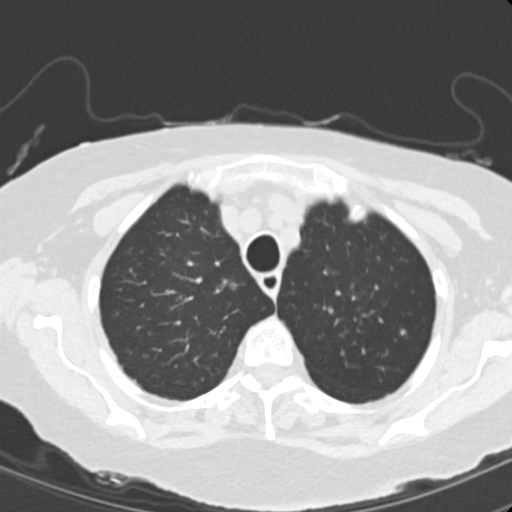
[im 146/159  lung]
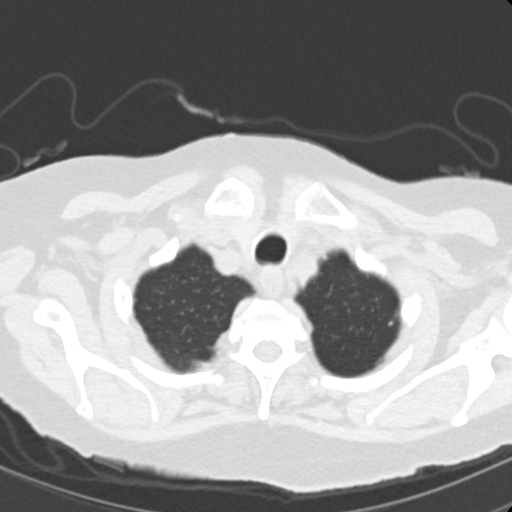

[Series 5: coronals chest 2.00 cor · coronal · 0.56mm/px · 3 of 144 slices shown]
[im 29/144  lung]
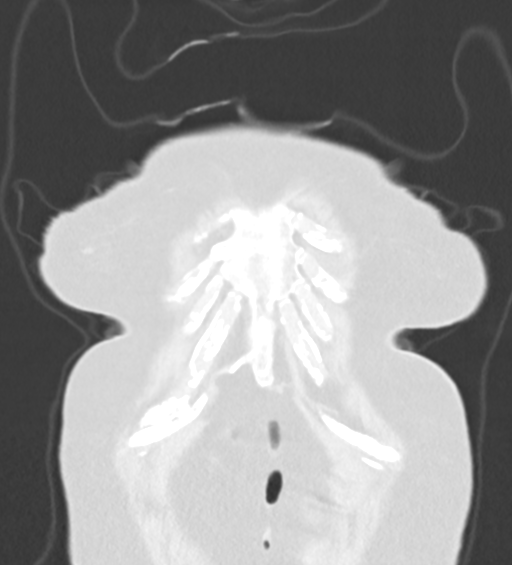
[im 58/144  lung]
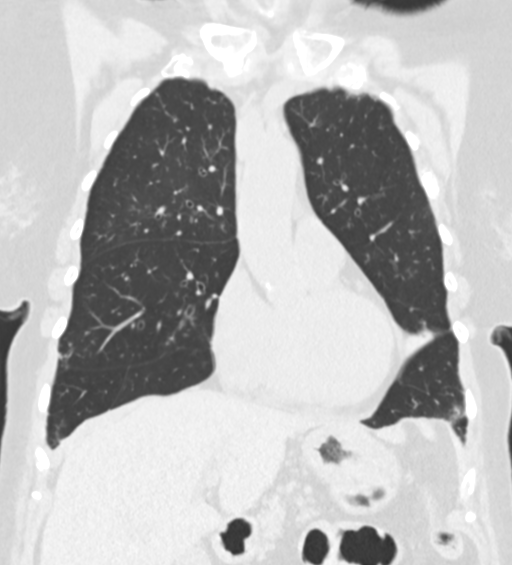
[im 86/144  lung]
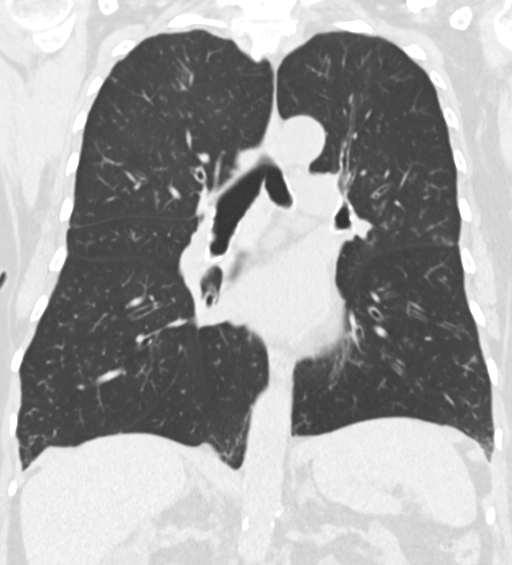

[13 of 36 positions shown; findings below may reference images not displayed]

FINDINGS: Cardiovascular: Normal cardiac size.Trace pericardial fluid.Normal
size main and branch pulmonary arteries.Mild aortic arch
calcifications. Mild coronary artery calcifications.

Mediastinum/Nodes: No lymphadenopathy.The thyroid is somewhat
atrophic.Esophagus is unremarkable.The trachea is unremarkable.

Lungs/Pleura: There are patchy ground-glass opacities in the right
upper lobe and diffuse centrilobular and tree in bud ground-glass
nodularity bilaterally. There is a right lower lobe pulmonary nodule
measuring 0.6 cm (series 3, image 94). There is a calcified
granuloma in the right upper lobe. There is a 0.4 cm pulmonary
nodule right middle lobe. There is a left basilar pulmonary nodule
measuring 0.5 cm (series 3, image 118). No pleural effusion.No
pneumothorax.

Upper Abdomen: Renal cortical thinning bilaterally. There are
partially visualized right renal cysts, previously evaluated on
ultrasound and appear simple. No acute abnormality.

Musculoskeletal: No acute osseous abnormality.No suspicious lytic or
blastic lesions. Partially visualized hip arthroplasties on scout
topogram.
IMPRESSION: Diffuse infectious/inflammatory bronchiolitis, with most confluent
ground-glass opacities in the right upper lobe. Hypersensitivity
pneumonitis could have a similar appearance.

Multiple solid pulmonary nodules bilaterally, largest measuring up
to 0.6 cm. Non-contrast chest CT at 3-6 months is recommended. If
the nodules are stable at time of repeat CT, then future CT at 18-24
months (from today's scan) is considered optional for low-risk
patients, but is recommended for high-risk patients. This
recommendation follows the consensus statement: Guidelines for
Management of Incidental Pulmonary Nodules Detected on CT Images:

ADDENDUM:
There is an additional incidental finding: There is a 1.9 cm right
breast nodule which is not visible on prior CT in May 2018.
Recommend correlation with diagnostic mammography and/or ultrasound.

*** End of Addendum ***
FINDINGS: Cardiovascular: Normal cardiac size.Trace pericardial fluid.Normal
size main and branch pulmonary arteries.Mild aortic arch
calcifications. Mild coronary artery calcifications.

Mediastinum/Nodes: No lymphadenopathy.The thyroid is somewhat
atrophic.Esophagus is unremarkable.The trachea is unremarkable.

Lungs/Pleura: There are patchy ground-glass opacities in the right
upper lobe and diffuse centrilobular and tree in bud ground-glass
nodularity bilaterally. There is a right lower lobe pulmonary nodule
measuring 0.6 cm (series 3, image 94). There is a calcified
granuloma in the right upper lobe. There is a 0.4 cm pulmonary
nodule right middle lobe. There is a left basilar pulmonary nodule
measuring 0.5 cm (series 3, image 118). No pleural effusion.No
pneumothorax.

Upper Abdomen: Renal cortical thinning bilaterally. There are
partially visualized right renal cysts, previously evaluated on
ultrasound and appear simple. No acute abnormality.

Musculoskeletal: No acute osseous abnormality.No suspicious lytic or
blastic lesions. Partially visualized hip arthroplasties on scout
topogram.
IMPRESSION: Diffuse infectious/inflammatory bronchiolitis, with most confluent
ground-glass opacities in the right upper lobe. Hypersensitivity
pneumonitis could have a similar appearance.

Multiple solid pulmonary nodules bilaterally, largest measuring up
to 0.6 cm. Non-contrast chest CT at 3-6 months is recommended. If
the nodules are stable at time of repeat CT, then future CT at 18-24
months (from today's scan) is considered optional for low-risk
patients, but is recommended for high-risk patients. This
recommendation follows the consensus statement: Guidelines for
Management of Incidental Pulmonary Nodules Detected on CT Images:

## 2022-03-28 DIAGNOSIS — I10 Essential (primary) hypertension: Secondary | ICD-10-CM | POA: Diagnosis not present

## 2022-03-28 DIAGNOSIS — N184 Chronic kidney disease, stage 4 (severe): Secondary | ICD-10-CM | POA: Diagnosis not present

## 2022-03-28 DIAGNOSIS — E1122 Type 2 diabetes mellitus with diabetic chronic kidney disease: Secondary | ICD-10-CM | POA: Diagnosis not present

## 2022-04-05 ENCOUNTER — Ambulatory Visit: Payer: Medicare PPO

## 2022-04-06 ENCOUNTER — Other Ambulatory Visit: Payer: Self-pay | Admitting: Family Medicine

## 2022-04-07 DIAGNOSIS — H3554 Dystrophies primarily involving the retinal pigment epithelium: Secondary | ICD-10-CM | POA: Diagnosis not present

## 2022-04-07 DIAGNOSIS — H35373 Puckering of macula, bilateral: Secondary | ICD-10-CM | POA: Diagnosis not present

## 2022-04-07 DIAGNOSIS — Z961 Presence of intraocular lens: Secondary | ICD-10-CM | POA: Diagnosis not present

## 2022-04-08 NOTE — Telephone Encounter (Signed)
Dr Diona Browner does patient need to continue on RX vitamin D?

## 2022-04-21 ENCOUNTER — Other Ambulatory Visit: Payer: Self-pay

## 2022-04-21 NOTE — Patient Outreach (Signed)
[facility]) Care Management  04/21/2022  PAYAL STANFORTH [birth year] [id number]   Case Transfer  Pt will be followed for care coordination by [facility] Management. Assigned RN CM will outreach to patient.     Enzo Montgomery, RN,BSN,CCM [facility] Management Telephonic Care Management Coordinator Direct Phone: [phone] Toll Free: [phone] Fax: [phone]

## 2022-04-27 ENCOUNTER — Ambulatory Visit: Payer: Medicare PPO | Admitting: Family Medicine

## 2022-04-27 ENCOUNTER — Ambulatory Visit (INDEPENDENT_AMBULATORY_CARE_PROVIDER_SITE_OTHER)
Admission: RE | Admit: 2022-04-27 | Discharge: 2022-04-27 | Disposition: A | Discharge status: Home / Self Care | Payer: Medicare PPO | Source: Ambulatory Visit | Attending: Family Medicine | Admitting: Family Medicine

## 2022-04-27 VITALS — BP 132/70 | HR 80 | Wt 170.0 lb

## 2022-04-27 DIAGNOSIS — M25561 Pain in right knee: Secondary | ICD-10-CM

## 2022-04-27 DIAGNOSIS — W19XXXA Unspecified fall, initial encounter: Secondary | ICD-10-CM

## 2022-04-27 NOTE — Assessment & Plan Note (Signed)
Acute, most likely bone bruise with associated effusion and irritation of osteoarthritis.  Given age and unknown osteoporosis status (does not want to do bone densities), I will evaluate with x-ray to rule out fracture.   Will treat with ice, elevation, Tylenol as needed for pain and topical anti-inflammatory (Voltaren gel 4 times daily)

## 2022-04-27 NOTE — Assessment & Plan Note (Signed)
Discussed fall safety and review and home safety with son and patient.  No preceding symptoms

## 2022-04-27 NOTE — Patient Instructions (Signed)
Can use tylenol as needed for pain in knee.  Apply Voltaren gel up to 4 times daily for pain and swelling.  Ice anterior knee and elevate as able.  We will call with X-ray results.

## 2022-04-27 NOTE — Progress Notes (Signed)
Patient ID: Jill [name], female    DOB: 1931-12-28, 88 y.o.   MRN: [id number]  This visit was conducted in person.  BP 132/70   Pulse 80   Wt 170 lb (77.1 kg)   SpO2 95%   BMI 31.09 kg/m    CC:  Chief Complaint  Patient presents with   Knee Pain    Fell 1 wk ago pain-3 No other injuries    Subjective:   HPI: Jill [name] is a 88 y.o. female presenting on 04/27/2022 for Knee Pain (Fell 1 wk ago pain-3 No other injuries)   She reports accidental fall 1 week ago.  Son reports she lost balance when bending to clean the floors. Landed on right anterior knee. No head injury. No proceeding symptoms.. no CP, no SOB.  No bruising,  some anterior swelling... Pain with bending knee.  Still able weight bear.. walking with a cane or walker.  She has had increasing soreness in right anterior knee.    Using Advil and Tylenol as needed for pain.   Relevant past medical, surgical, family and social history reviewed and updated as indicated. Interim medical history since our last visit reviewed. Allergies and medications reviewed and updated. Outpatient Medications Prior to Visit  Medication Sig Dispense Refill   acetaminophen (TYLENOL) 500 MG tablet Take 1,000 mg by mouth every 6 (six) hours as needed for moderate pain or headache.     albuterol (VENTOLIN HFA) 108 (90 Base) MCG/ACT inhaler Inhale 2 puffs into the lungs every 6 (six) hours as needed for wheezing or shortness of breath. 8 g 0   allopurinol (ZYLOPRIM) 100 MG tablet Take 0.5 tablets (50 mg total) by mouth every other day. 15 tablet 11   Cranberry 500 MG CAPS Take by mouth.     diclofenac Sodium (VOLTAREN) 1 % GEL Apply 2 g topically 4 (four) times daily as needed.     donepezil (ARICEPT) 10 MG tablet TAKE 1 TABLET BY MOUTH EVERYDAY AT BEDTIME 90 tablet 1   escitalopram (LEXAPRO) 20 MG tablet TAKE 1 TABLET BY MOUTH EVERY DAY 90 tablet 1   estradiol (ESTRACE VAGINAL) 0.1 MG/GM vaginal cream Apply 0.'5mg'$  (pea-sized  amount)  just inside the vaginal introitus with a finger-tip on Monday, Wednesday and Friday nights. 30 g 12   hydrOXYzine (ATARAX/VISTARIL) 10 MG tablet TAKE 0.5-1 TABLETS (5-10 MG TOTAL) BY MOUTH DAILY AS NEEDED FOR ANXIETY. 90 tablet 0   loratadine (CLARITIN) 10 MG tablet Take by mouth.     Multiple Vitamins-Minerals (MULTIVITAMIN WITH MINERALS) tablet Take 1 tablet by mouth daily.     Probiotic Product (PROBIOTIC DAILY PO) Take by mouth.     TART CHERRY PO Take 1 tablet by mouth in the morning and at bedtime.     tiotropium (SPIRIVA HANDIHALER) 18 MCG inhalation capsule INHALE 1 CAPSULE VIA HANDIHALER ONCE DAILY AT THE SAME TIME EVERY DAY 30 capsule 5   Vitamin D, Ergocalciferol, (DRISDOL) 1.25 MG (50000 UNIT) CAPS capsule TAKE 1 CAPSULE (50,000 UNITS TOTAL) BY MOUTH EVERY FRIDAY. 12 capsule 3   No facility-administered medications prior to visit.     Per HPI unless specifically indicated in ROS section below Review of Systems  Constitutional:  Negative for fatigue and fever.  HENT:  Negative for congestion.   Eyes:  Negative for pain.  Respiratory:  Negative for cough and shortness of breath.   Cardiovascular:  Negative for chest pain, palpitations and leg swelling.  Gastrointestinal:  Negative for  abdominal pain.  Genitourinary:  Negative for dysuria and vaginal bleeding.  Musculoskeletal:  Negative for back pain.  Neurological:  Negative for syncope, light-headedness and headaches.  Psychiatric/Behavioral:  Negative for dysphoric mood.    Objective:  BP 132/70   Pulse 80   Wt 170 lb (77.1 kg)   SpO2 95%   BMI 31.09 kg/m   Wt Readings from Last 3 Encounters:  04/27/22 170 lb (77.1 kg)  02/15/22 170 lb 2 oz (77.2 kg)  01/05/22 170 lb (77.1 kg)      Physical Exam Constitutional:      General: She is not in acute distress.    Appearance: Normal appearance. She is well-developed. She is not ill-appearing or toxic-appearing.  HENT:     Head: Normocephalic.     Right Ear:  Hearing, tympanic membrane, ear canal and external ear normal. Tympanic membrane is not erythematous, retracted or bulging.     Left Ear: Hearing, tympanic membrane, ear canal and external ear normal. Tympanic membrane is not erythematous, retracted or bulging.     Nose: No mucosal edema or rhinorrhea.     Right Sinus: No maxillary sinus tenderness or frontal sinus tenderness.     Left Sinus: No maxillary sinus tenderness or frontal sinus tenderness.     Mouth/Throat:     Pharynx: Uvula midline.  Eyes:     General: Lids are normal. Lids are everted, no foreign bodies appreciated.     Conjunctiva/sclera: Conjunctivae normal.     Pupils: Pupils are equal, round, and reactive to light.  Neck:     Thyroid: No thyroid mass or thyromegaly.     Vascular: No carotid bruit.     Trachea: Trachea normal.  Cardiovascular:     Rate and Rhythm: Normal rate and regular rhythm.     Pulses: Normal pulses.     Heart sounds: Normal heart sounds, S1 normal and S2 normal. No murmur heard.    No friction rub. No gallop.  Pulmonary:     Effort: Pulmonary effort is normal. No tachypnea or respiratory distress.     Breath sounds: Normal breath sounds. No decreased breath sounds, wheezing, rhonchi or rales.  Abdominal:     General: Bowel sounds are normal.     Palpations: Abdomen is soft.     Tenderness: There is no abdominal tenderness.  Musculoskeletal:     Cervical back: Normal range of motion and neck supple.     Right knee: Swelling and effusion present. No erythema, ecchymosis or bony tenderness. Decreased range of motion. Tenderness present over the medial joint line. No MCL, LCL, ACL, PCL or patellar tendon tenderness. Normal alignment, normal meniscus and normal patellar mobility.     Left knee: No erythema. Normal range of motion. No tenderness.  Skin:    General: Skin is warm and dry.     Findings: No rash.  Neurological:     Mental Status: She is alert.  Psychiatric:        Mood and Affect:  Mood is not anxious or depressed.        Speech: Speech normal.        Behavior: Behavior normal. Behavior is cooperative.        Thought Content: Thought content normal.        Judgment: Judgment normal.       Results for orders placed or performed in visit on 02/15/22  POCT glycosylated hemoglobin (Hb A1C)  Result Value Ref Range   Hemoglobin A1C 7.1 (A)  4.0 - 5.6 %   HbA1c POC (<> result, manual entry)     HbA1c, POC (prediabetic range)     HbA1c, POC (controlled diabetic range)       COVID 19 screen:  No recent travel or known exposure to COVID19 The patient denies respiratory symptoms of COVID 19 at this time. The importance of social distancing was discussed today.   Assessment and Plan    Problem List Items Addressed This Visit     Accidental fall - Primary    Discussed fall safety and review and home safety with son and patient.  No preceding symptoms      Relevant Orders   DG Knee Complete 4 Views Right   Acute pain of right knee    Acute, most likely bone bruise with associated effusion and irritation of osteoarthritis.  Given age and unknown osteoporosis status (does not want to do bone densities), I will evaluate with x-ray to rule out fracture.   Will treat with ice, elevation, Tylenol as needed for pain and topical anti-inflammatory (Voltaren gel 4 times daily)      Relevant Orders   DG Knee Complete 4 Views Right     Eliezer Lofts, MD

## 2022-05-19 ENCOUNTER — Other Ambulatory Visit: Payer: Self-pay | Admitting: Family Medicine

## 2022-05-27 IMAGING — CT CT ABD-PELV W/O CM
2 of 4 series · 16 of 46 positions shown, 18 images · non-contrast
Comparison: 01/29/2021

CLINICAL DATA: Abdominal pain, diarrhea, concern for C diff
recurrence



[Series 2: abd pel wo · axial · 0.74mm/px · z∈[+707,+1077]mm · 13 of 82 slices shown, 15 images]
[im 4/82  soft-tissue]
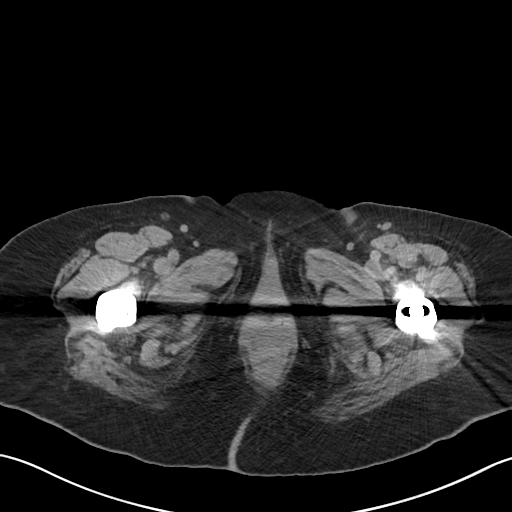
[im 4/82  bone]
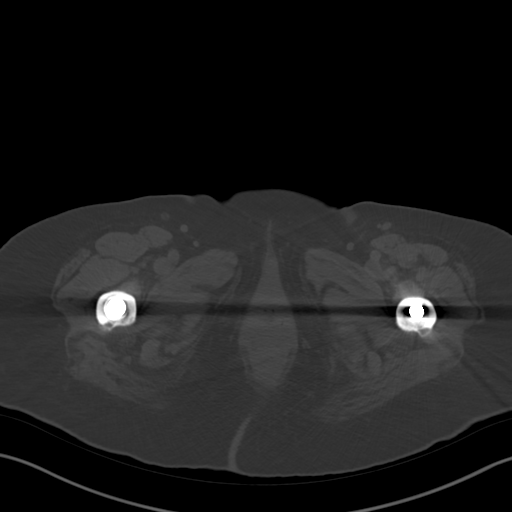
[im 11/82  soft-tissue]
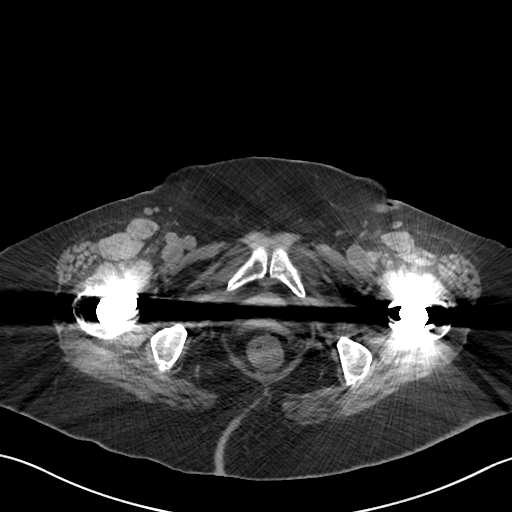
[im 21/82  soft-tissue]
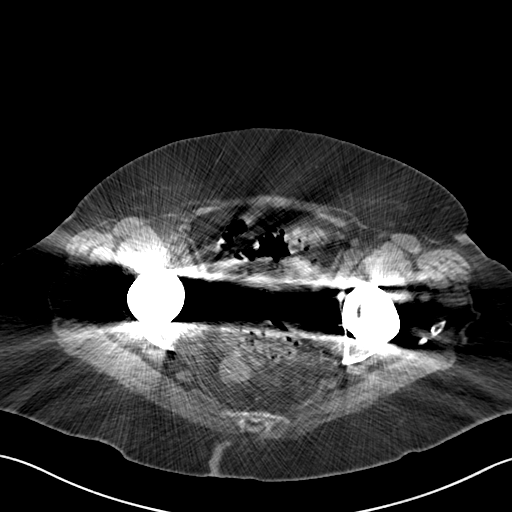
[im 24/82  soft-tissue]
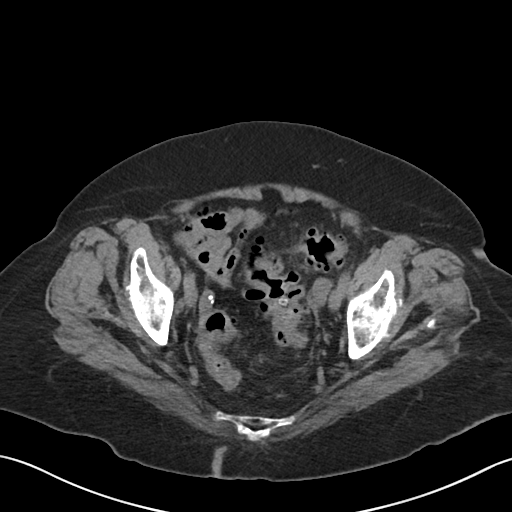
[im 31/82  soft-tissue]
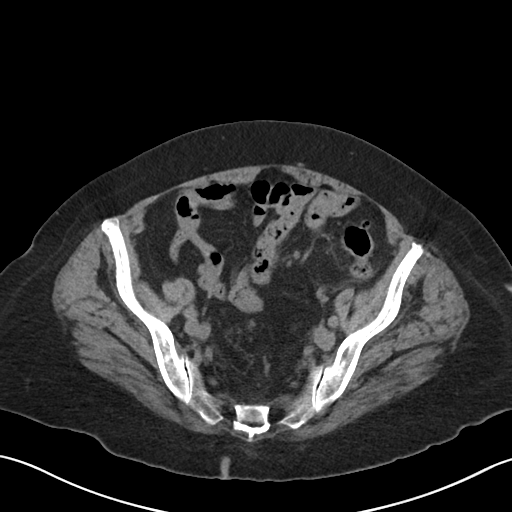
[im 38/82  soft-tissue]
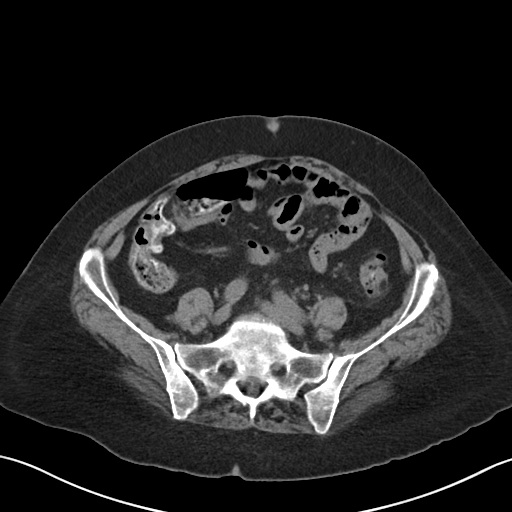
[im 44/82  soft-tissue]
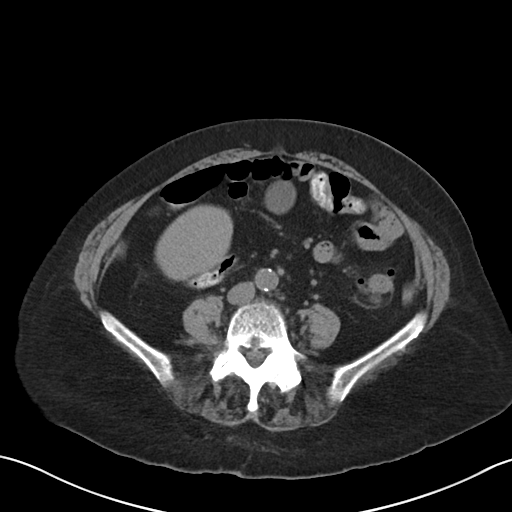
[im 48/82  soft-tissue]
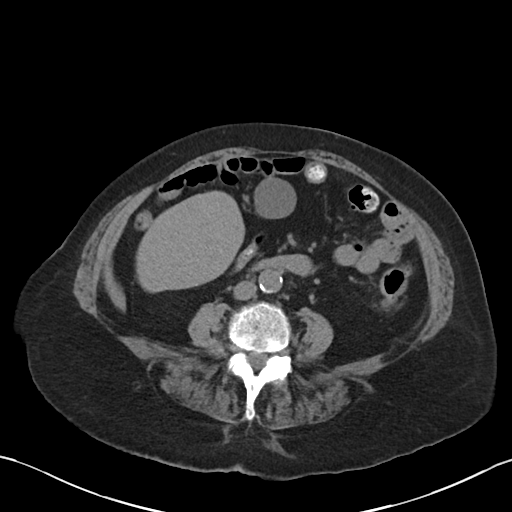
[im 55/82  soft-tissue]
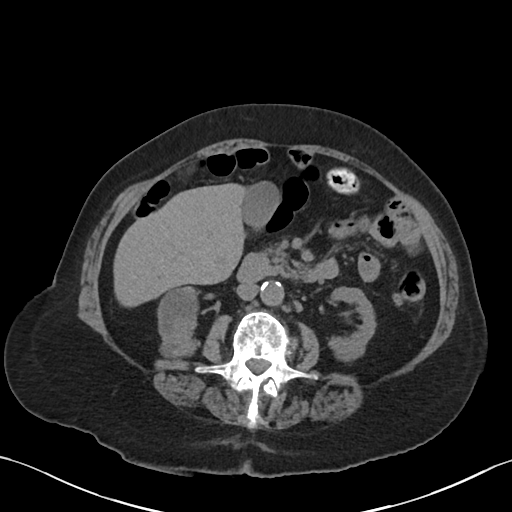
[im 55/82  bone]
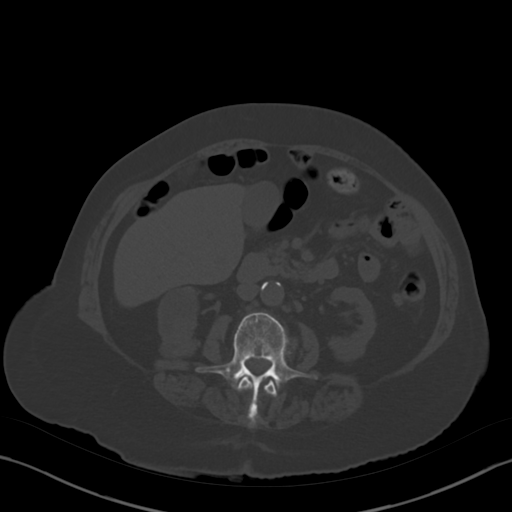
[im 61/82  soft-tissue]
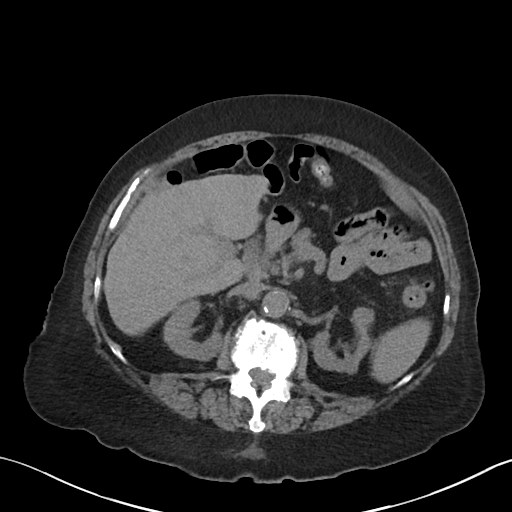
[im 65/82  soft-tissue]
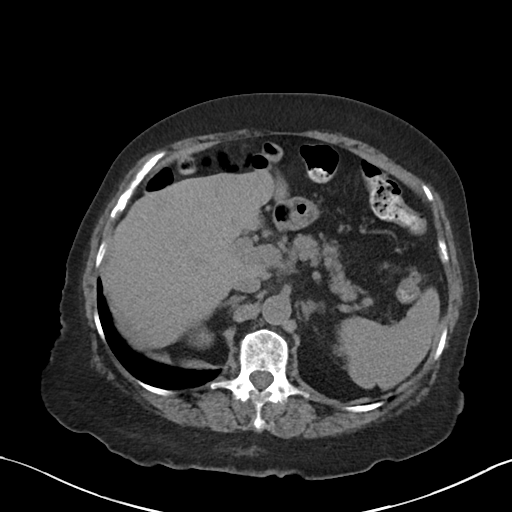
[im 71/82  soft-tissue]
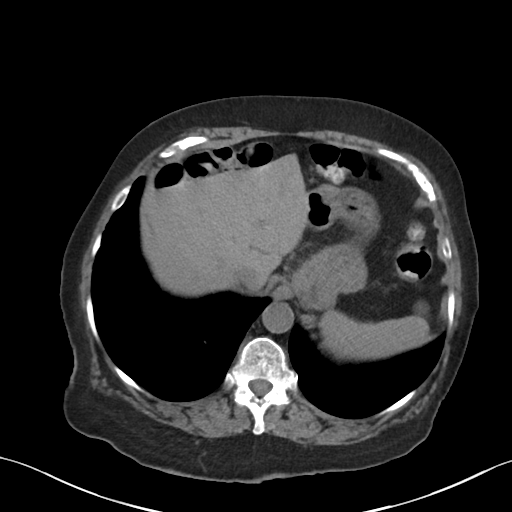
[im 78/82  soft-tissue]
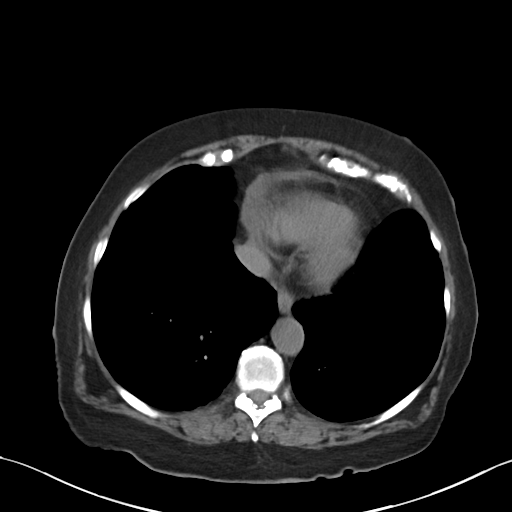

[Series 5: coronal · coronal · 0.79mm/px · 3 of 99 slices shown]
[im 33/99  soft-tissue]
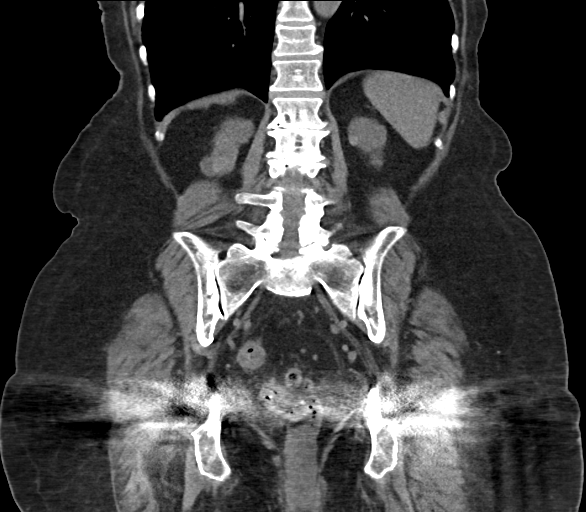
[im 44/99  soft-tissue]
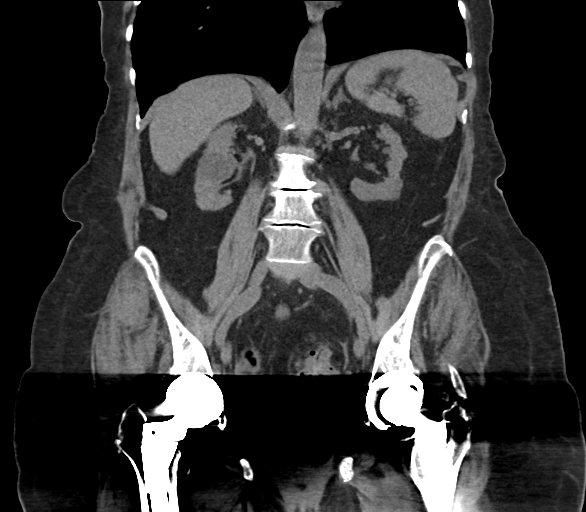
[im 55/99  soft-tissue]
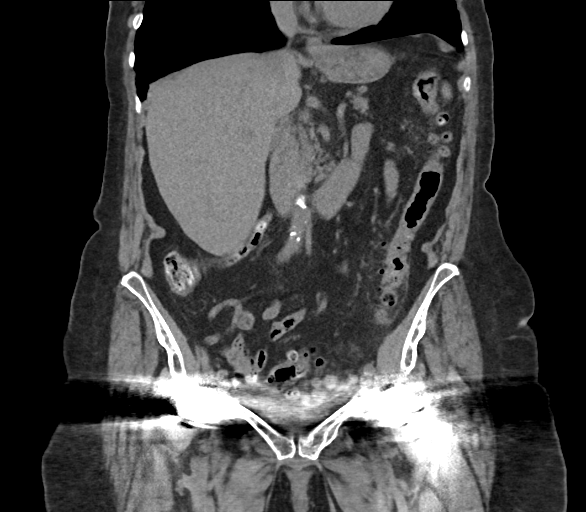

[16 of 46 positions shown; findings below may reference images not displayed]

FINDINGS: Lower chest: No acute abnormality.  Small hiatal hernia.

Hepatobiliary: No solid liver abnormality is seen. No gallstones,
gallbladder wall thickening, or biliary dilatation.

Pancreas: Unremarkable. No pancreatic ductal dilatation or
surrounding inflammatory changes.

Spleen: Normal in size without significant abnormality.

Adrenals/Urinary Tract: Adrenal glands are unremarkable. Atrophic
appearing kidneys. Multiple bilateral renal lesions of varying
attenuation, some fluid attenuation cysts, others incompletely
characterized although likely hemorrhagic or proteinaceous cysts.
Evaluation of the bladder is very limited by dense metallic streak
artifact from adjacent bilateral hip arthroplasty.

Stomach/Bowel: Stomach is within normal limits. Appendix appears
normal. Descending and sigmoid diverticulosis. Minimal fat stranding
about the descending and sigmoid colon, similar in appearance to
prior examination.

Vascular/Lymphatic: Aortic atherosclerosis. No enlarged abdominal or
pelvic lymph nodes.

Reproductive: No mass or other significant abnormality.

Other: No abdominal wall hernia or abnormality. No ascites.

Musculoskeletal: No acute or significant osseous findings. Status
post bilateral hip total arthroplasty.
IMPRESSION: 1. Descending and sigmoid diverticulosis. Minimal fat stranding
about the descending and sigmoid colon, similar in appearance to
prior examination, which may reflect minimal acute diverticulitis or
chronic sequelae.
2. Atrophic appearing kidneys with multiple bilateral renal lesions
of varying attenuation, some fluid attenuation cysts, others
incompletely characterized noncontrast CT although likely
hemorrhagic or proteinaceous cysts.
3. Evaluation of the bladder is very limited by dense metallic
streak artifact from adjacent bilateral hip arthroplasty.

Aortic Atherosclerosis ([id number]).

## 2022-06-13 ENCOUNTER — Telehealth: Payer: Self-pay | Admitting: Family Medicine

## 2022-06-13 NOTE — Telephone Encounter (Signed)
Left message for patient to call back and schedule Medicare Annual Wellness Visit (AWV) either virtually or phone  . Left  my jabber number [phone]   Last AWV  03/05/21    45 min for awv-i and in office appointments 30 min for awv-s  phone/virtual appointments

## 2022-06-22 ENCOUNTER — Ambulatory Visit (INDEPENDENT_AMBULATORY_CARE_PROVIDER_SITE_OTHER): Payer: Medicare PPO

## 2022-06-22 VITALS — Ht 62.0 in | Wt 170.0 lb

## 2022-06-22 DIAGNOSIS — Z0000 Encounter for general adult medical examination without abnormal findings: Secondary | ICD-10-CM | POA: Diagnosis not present

## 2022-06-22 NOTE — Patient Instructions (Addendum)
Jill [name] , Thank you for taking time to come for your Medicare Wellness Visit. I appreciate your ongoing commitment to your health goals. Please review the following plan we discussed and let me know if I can assist you in the future.   These are the goals we discussed:  Goals       Patient Stated      Starting 08/08/18, I will continue to take medications as prescribed.       Patient Stated      08/13/2019, I will maintain and continue medications as prescribed.       Stay alive (pt-stated)        This is a list of the screening recommended for you and due dates:  Health Maintenance  Topic Date Due   Eye exam for diabetics  04/06/2022   COVID-19 Vaccine (6 - Pfizer series) 07/08/2022*   Zoster (Shingles) Vaccine (1 of 2) 09/22/2022*   Flu Shot  11/20/2022*   Complete foot exam   07/09/2022   Hemoglobin A1C  08/17/2022   Medicare Annual Wellness Visit  06/23/2023   Tetanus Vaccine  02/21/2024   Pneumonia Vaccine  Completed   DEXA scan (bone density measurement)  Completed   HPV Vaccine  Aged Out  *Topic was postponed. The date shown is not the original due date.    Advanced directives: Please bring a copy of your health care power of attorney and living will to the office to be added to your chart at your convenience.   Conditions/risks identified: None  Next appointment: Follow up in one year for your annual wellness visit     Preventive Care 65 Years and Older, Female Preventive care refers to lifestyle choices and visits with your health care provider that can promote health and wellness. What does preventive care include? A yearly physical exam. This is also called an annual well check. Dental exams once or twice a year. Routine eye exams. Ask your health care provider how often you should have your eyes checked. Personal lifestyle choices, including: Daily care of your teeth and gums. Regular physical activity. Eating a healthy diet. Avoiding tobacco and drug  use. Limiting alcohol use. Practicing safe sex. Taking low-dose aspirin every day. Taking vitamin and mineral supplements as recommended by your health care provider. What happens during an annual well check? The services and screenings done by your health care provider during your annual well check will depend on your age, overall health, lifestyle risk factors, and family history of disease. Counseling  Your health care provider may ask you questions about your: Alcohol use. Tobacco use. Drug use. Emotional well-being. Home and relationship well-being. Sexual activity. Eating habits. History of falls. Memory and ability to understand (cognition). Work and work Statistician. Reproductive health. Screening  You may have the following tests or measurements: Height, weight, and BMI. Blood pressure. Lipid and cholesterol levels. These may be checked every 5 years, or more frequently if you are over 72 years old. Skin check. Lung cancer screening. You may have this screening every year starting at age 38 if you have a 30-pack-year history of smoking and currently smoke or have quit within the past 15 years. Fecal occult blood test (FOBT) of the stool. You may have this test every year starting at age 84. Flexible sigmoidoscopy or colonoscopy. You may have a sigmoidoscopy every 5 years or a colonoscopy every 10 years starting at age 63. Hepatitis C blood test. Hepatitis B blood test. Sexually transmitted disease (STD)  testing. Diabetes screening. This is done by checking your blood sugar (glucose) after you have not eaten for a while (fasting). You may have this done every 1-3 years. Bone density scan. This is done to screen for osteoporosis. You may have this done starting at age 56. Mammogram. This may be done every 1-2 years. Talk to your health care provider about how often you should have regular mammograms. Talk with your health care provider about your test results, treatment  options, and if necessary, the need for more tests. Vaccines  Your health care provider may recommend certain vaccines, such as: Influenza vaccine. This is recommended every year. Tetanus, diphtheria, and acellular pertussis (Tdap, Td) vaccine. You may need a Td booster every 10 years. Zoster vaccine. You may need this after age 71. Pneumococcal 13-valent conjugate (PCV13) vaccine. One dose is recommended after age 38. Pneumococcal polysaccharide (PPSV23) vaccine. One dose is recommended after age 53. Talk to your health care provider about which screenings and vaccines you need and how often you need them. This information is not intended to replace advice given to you by your health care provider. Make sure you discuss any questions you have with your health care provider. Document Released: 09/04/2015 Document Revised: 04/27/2016 Document Reviewed: 06/09/2015 Elsevier Interactive Patient Education  2017 [employer] Prevention in the Home Falls can cause injuries. They can happen to people of all ages. There are many things you can do to make your home safe and to help prevent falls. What can I do on the outside of my home? Regularly fix the edges of walkways and driveways and fix any cracks. Remove anything that might make you trip as you walk through a door, such as a raised step or threshold. Trim any bushes or trees on the path to your home. Use bright outdoor lighting. Clear any walking paths of anything that might make someone trip, such as rocks or tools. Regularly check to see if handrails are loose or broken. Make sure that both sides of any steps have handrails. Any raised decks and porches should have guardrails on the edges. Have any leaves, snow, or ice cleared regularly. Use sand or salt on walking paths during winter. Clean up any spills in your garage right away. This includes oil or grease spills. What can I do in the bathroom? Use night lights. Install grab  bars by the toilet and in the tub and shower. Do not use towel bars as grab bars. Use non-skid mats or decals in the tub or shower. If you need to sit down in the shower, use a plastic, non-slip stool. Keep the floor dry. Clean up any water that spills on the floor as soon as it happens. Remove soap buildup in the tub or shower regularly. Attach bath mats securely with double-sided non-slip rug tape. Do not have throw rugs and other things on the floor that can make you trip. What can I do in the bedroom? Use night lights. Make sure that you have a light by your bed that is easy to reach. Do not use any sheets or blankets that are too big for your bed. They should not hang down onto the floor. Have a firm chair that has side arms. You can use this for support while you get dressed. Do not have throw rugs and other things on the floor that can make you trip. What can I do in the kitchen? Clean up any spills right away. Avoid walking on wet  floors. Keep items that you use a lot in easy-to-reach places. If you need to reach something above you, use a strong step stool that has a grab bar. Keep electrical cords out of the way. Do not use floor polish or wax that makes floors slippery. If you must use wax, use non-skid floor wax. Do not have throw rugs and other things on the floor that can make you trip. What can I do with my stairs? Do not leave any items on the stairs. Make sure that there are handrails on both sides of the stairs and use them. Fix handrails that are broken or loose. Make sure that handrails are as long as the stairways. Check any carpeting to make sure that it is firmly attached to the stairs. Fix any carpet that is loose or worn. Avoid having throw rugs at the top or bottom of the stairs. If you do have throw rugs, attach them to the floor with carpet tape. Make sure that you have a light switch at the top of the stairs and the bottom of the stairs. If you do not have them,  ask someone to add them for you. What else can I do to help prevent falls? Wear shoes that: Do not have high heels. Have rubber bottoms. Are comfortable and fit you well. Are closed at the toe. Do not wear sandals. If you use a stepladder: Make sure that it is fully opened. Do not climb a closed stepladder. Make sure that both sides of the stepladder are locked into place. Ask someone to hold it for you, if possible. Clearly mark and make sure that you can see: Any grab bars or handrails. First and last steps. Where the edge of each step is. Use tools that help you move around (mobility aids) if they are needed. These include: Canes. Walkers. Scooters. Crutches. Turn on the lights when you go into a dark area. Replace any light bulbs as soon as they burn out. Set up your furniture so you have a clear path. Avoid moving your furniture around. If any of your floors are uneven, fix them. If there are any pets around you, be aware of where they are. Review your medicines with your doctor. Some medicines can make you feel dizzy. This can increase your chance of falling. Ask your doctor what other things that you can do to help prevent falls. This information is not intended to replace advice given to you by your health care provider. Make sure you discuss any questions you have with your health care provider. Document Released: 06/04/2009 Document Revised: 01/14/2016 Document Reviewed: 09/12/2014 Elsevier Interactive Patient Education  2017 [employer].

## 2022-06-22 NOTE — Progress Notes (Signed)
Subjective:   Jill [name] is a 88 y.o. female who presents for Medicare Annual (Subsequent) preventive examination.  Review of Systems    Virtual Visit via Telephone Note  I connected with  Jill [name] on 06/22/22 at 12:30 PM EDT by telephone and verified that I am speaking with the correct person using two identifiers.  Location: Patient: Home Provider: Office Persons participating in the virtual visit: patient/Nurse Health Advisor   I discussed the limitations, risks, security and privacy concerns of performing an evaluation and management service by telephone and the availability of in person appointments. The patient expressed understanding and agreed to proceed.  Interactive audio and video telecommunications were attempted between this nurse and patient, however failed, due to patient having technical difficulties OR patient did not have access to video capability.  We continued and completed visit with audio only.  Some vital signs may be absent or patient reported.   Criselda Peaches, LPN  Cardiac Risk Factors include: advanced age (>58mn, >>11women);hypertension;Other (see comment), Risk factor comments: Dx COPD     Objective:    Today's Vitals   06/22/22 1240  Weight: 170 lb (77.1 kg)  Height: '5\' 2"'$  (1.575 m)   Body mass index is 31.09 kg/m.     06/22/2022   12:49 PM 03/21/2022   10:31 AM 11/06/2021    2:06 PM 01/29/2021    9:26 AM 01/19/2021    3:33 PM 12/07/2020    3:04 PM 11/14/2020    8:25 PM  Advanced Directives  Does Patient Have a Medical Advance Directive? Yes Yes No No No Yes Yes  Type of [place]    Living will Living will  Does patient want to make changes to medical advance directive?  No - Guardian declined    No - Patient declined No - Patient declined  Copy of [place] Chart? No - copy requested        Would patient like information on creating a medical  advance directive?   No - Patient declined No - Patient declined No - Patient declined      Current Medications (verified) Outpatient Encounter Medications as of 06/22/2022  Medication Sig   acetaminophen (TYLENOL) 500 MG tablet Take 1,000 mg by mouth every 6 (six) hours as needed for moderate pain or headache.   albuterol (VENTOLIN HFA) 108 (90 Base) MCG/ACT inhaler Inhale 2 puffs into the lungs every 6 (six) hours as needed for wheezing or shortness of breath.   allopurinol (ZYLOPRIM) 100 MG tablet TAKE 1/2 TABLET BY MOUTH EVERY OTHER DAY   Cranberry 500 MG CAPS Take by mouth.   diclofenac Sodium (VOLTAREN) 1 % GEL Apply 2 g topically 4 (four) times daily as needed.   donepezil (ARICEPT) 10 MG tablet TAKE 1 TABLET BY MOUTH EVERYDAY AT BEDTIME   escitalopram (LEXAPRO) 20 MG tablet TAKE 1 TABLET BY MOUTH EVERY DAY   estradiol (ESTRACE VAGINAL) 0.1 MG/GM vaginal cream Apply 0.'5mg'$  (pea-sized amount)  just inside the vaginal introitus with a finger-tip on Monday, Wednesday and Friday nights.   hydrOXYzine (ATARAX/VISTARIL) 10 MG tablet TAKE 0.5-1 TABLETS (5-10 MG TOTAL) BY MOUTH DAILY AS NEEDED FOR ANXIETY.   loratadine (CLARITIN) 10 MG tablet Take by mouth.   Multiple Vitamins-Minerals (MULTIVITAMIN WITH MINERALS) tablet Take 1 tablet by mouth daily.   Probiotic Product (PROBIOTIC DAILY PO) Take by mouth.   TART CHERRY PO Take 1 tablet by  mouth in the morning and at bedtime.   tiotropium (SPIRIVA HANDIHALER) 18 MCG inhalation capsule INHALE 1 CAPSULE VIA HANDIHALER ONCE DAILY AT THE SAME TIME EVERY DAY   Vitamin D, Ergocalciferol, (DRISDOL) 1.25 MG (50000 UNIT) CAPS capsule TAKE 1 CAPSULE (50,000 UNITS TOTAL) BY MOUTH EVERY FRIDAY.   No facility-administered encounter medications on file as of 06/22/2022.    Allergies (verified) Lovastatin, Statins, Sulfa antibiotics, Cephalosporins, Codeine, and Niacin   History: Past Medical History:  Diagnosis Date   Allergy    Arthritis     osteoarthritis    Chronic kidney disease    Complication of anesthesia    difficult waking    Depression    Diabetes mellitus    Diverticula, colon    Family history of anesthesia complication    Son is difficult intubation   Hyperlipidemia    Hypertension    Vasovagal syncope 2006   Negative cardiac workup-myoview, echo   Past Surgical History:  Procedure Laterality Date   ABDOMINAL HYSTERECTOMY  1977   fibroid   JOINT REPLACEMENT  2009   rt hip   TOTAL HIP ARTHROPLASTY Left 05/12/2014   dr Mayer Camel   TOTAL HIP ARTHROPLASTY Left 05/12/2014   Procedure: TOTAL HIP ARTHROPLASTY;  Surgeon: Kerin Salen, MD;  Location: [facility];  Service: Orthopedics;  Laterality: Left;   Family History  Problem Relation Age of Onset   COPD Brother    Heart disease Brother    Diabetes Brother    Lymphoma Brother    Alzheimer's disease Brother    Pancreatic cancer Sister    Alzheimer's disease Sister    Heart disease Mother    Breast cancer Sister    Leukemia Other    Kidney disease Neg Hx    Social History   Socioeconomic History   Marital status: Widowed    Spouse name: Not on file   Number of children: Not on file   Years of education: Not on file   Highest education level: Not on file  Occupational History   Not on file  Tobacco Use   Smoking status: Former    Packs/day: 1.00    Years: 45.00    Total pack years: 45.00    Types: Cigarettes    Quit date: 06/16/1999    Years since quitting: 23.0   Smokeless tobacco: Never   Tobacco comments:    quit 15 years ago  Substance and Sexual Activity   Alcohol use: No    Alcohol/week: 0.0 standard drinks of alcohol   Drug use: No   Sexual activity: Not Currently  Other Topics Concern   Not on file  Social History Narrative   widowed; married for > 50 years; 68 pack year smoke - quit '02, no etoh; happy in marriage; 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 Diet: healthy diet      Son Jill [name]) [place] , has living will and DNR ( reviewed 2015)               Social Determinants of Health   Financial Resource Strain: Low Risk  (06/22/2022)   Overall Financial Resource Strain (CARDIA)    Difficulty of Paying Living Expenses: Not hard at all  Food Insecurity: No Food Insecurity (06/22/2022)   Hunger Vital Sign    Worried About Running Out of Food in the Last Year:  Never true    Ran Out of Food in the Last Year: Never true  Transportation Needs: No Transportation Needs (06/22/2022)   PRAPARE - Hydrologist (Medical): No    Lack of Transportation (Non-Medical): No  Physical Activity: Inactive (06/22/2022)   Exercise Vital Sign    Days of Exercise per Week: 0 days    Minutes of Exercise per Session: 0 min  Stress: No Stress Concern Present (06/22/2022)   [place]    Feeling of Stress : Not at all  Social Connections: Moderately Integrated (06/22/2022)   Social Connection and Isolation Panel [NHANES]    Frequency of Communication with Friends and Family: More than three times a week    Frequency of Social Gatherings with Friends and Family: More than three times a week    Attends Religious Services: More than 4 times per year    Active Member of [employer] or Organizations: Yes    Attends Archivist Meetings: More than 4 times per year    Marital Status: Widowed    Tobacco Counseling Counseling given: Not Answered Tobacco comments: quit 15 years ago   Clinical Intake:  Pre-visit preparation completed: No  Pain : No/denies pain     BMI - recorded: 31.09 Nutritional Status: BMI > 30  Obese Nutritional Risks: None Diabetes: No  How often do you need to have someone help you when you read instructions, pamphlets, or other written materials from your doctor or pharmacy?: 1 -  Never  Diabetic?  No  Interpreter Needed?: No  Information entered by :: Rolene Arbour LPN   Activities of Daily Living    06/22/2022   12:47 PM 03/21/2022   10:22 AM  In your present state of health, do you have any difficulty performing the following activities:  Hearing? 0 0  Vision? 0 0  Difficulty concentrating or making decisions? 0 0  Walking or climbing stairs? 0 1  Comment  uses DME  Dressing or bathing? 0 0  Doing errands, shopping? 0 1  Comment  no longer drives-relies on family  Preparing Food and eating ? N N  Using the Toilet? N N  In the past six months, have you accidently leaked urine? N N  Do you have problems with loss of bowel control? N N  Managing your Medications? N Y  Comment  grandaughter fills med Dentist Finances? N Y  Comment  family assists  Housekeeping or managing your Housekeeping? Aggie Moats  Comment  family assists    Patient Care Team: Jinny Sanders, MD as PCP - General (Family Medicine)  Indicate any recent Medical Services you may have received from other than [facility] providers in the past year (date may be approximate).     Assessment:   This is a routine wellness examination for Jill [name].  Hearing/Vision screen Hearing Screening - Comments:: Denies hearing difficulties   Vision Screening - Comments:: Wears rx glasses - up to date with routine eye exams with  Dr Katy Fitch  Dietary issues and exercise activities discussed: Current Exercise Habits: The patient does not participate in regular exercise at present, Exercise limited by: None identified   Goals Addressed               This Visit's Progress     Stay alive (pt-stated)         Depression Screen    06/22/2022   12:46 PM 03/21/2022  10:20 AM 02/15/2022    4:12 PM 12/02/2021   10:59 AM 08/13/2019    2:02 PM 08/08/2018    1:55 PM 07/03/2017   11:33 AM  PHQ 2/9 Scores  PHQ - 2 Score 0 0 0 0 0 0 0  PHQ- 9 Score   4  0 0 0  Exception Documentation  Other-  indicate reason in comment box       Not completed  call completed with CG         Fall Risk    06/22/2022   12:48 PM 03/21/2022   10:21 AM 12/02/2021   10:59 AM 03/23/2021   11:47 AM 03/05/2021   11:56 AM  Fall Risk   Falls in the past year? '1 1 1 1 1  '$ Number falls in past yr: 0 0 '1 1 1  '$ Injury with Fall? 0 0 '1 1 1  '$ Comment Followed by PCP      Risk for fall due to : No Fall Risks History of fall(s);Impaired balance/gait     Follow up Falls prevention discussed Falls evaluation completed;Education provided       FALL RISK PREVENTION PERTAINING TO THE HOME:  Any stairs in or around the home? No  If so, are there any without handrails? No  Home free of loose throw rugs in walkways, pet beds, electrical cords, etc? Yes  Adequate lighting in your home to reduce risk of falls? Yes   ASSISTIVE DEVICES UTILIZED TO PREVENT FALLS:  Life alert? No  Use of a cane, walker or w/c? Yes  Grab bars in the bathroom? Yes  Shower chair or bench in shower? Yes  Elevated toilet seat or a handicapped toilet? Yes   TIMED UP AND GO:  Was the test performed? No . Audio Visit  Cognitive Function:    08/13/2019    2:06 PM 08/08/2018    1:55 PM 07/03/2017   11:36 AM 06/30/2016    1:36 PM  MMSE - Mini Mental State Exam  Orientation to time '5 5 5 5  '$ Orientation to Place '5 5 5 5  '$ Registration '3 3 3 3  '$ Attention/ Calculation 5 0 0 0  Recall '3 2 3 3  '$ Recall-comments  unable to recall 1 of 3 words    Language- name 2 objects  0 0 0  Language- repeat '1 1 1 1  '$ Language- follow 3 step command  '3 3 3  '$ Language- read & follow direction  0 0 0  Write a sentence  0 0 0  Copy design  0 0 0  Total score  '19 20 20        '$ 06/22/2022   12:49 PM  6CIT Screen  What Year? 0 points  What month? 0 points  What time? 0 points  Count back from 20 0 points  Months in reverse 2 points  Repeat phrase 10 points  Total Score 12 points    Immunizations Immunization History  Administered Date(s)  Administered   Fluad Quad(high Dose 65+) 05/28/2019, 06/03/2020   H1N1 08/18/2008   Influenza Whole 06/16/2007   Influenza, High Dose Seasonal PF 07/03/2017, 06/01/2018, 06/22/2021   Influenza,inj,Quad PF,6+ Mos 05/14/2014, 05/05/2015, 06/24/2016   Influenza-Unspecified 06/22/2013   PFIZER Comirnaty(Gray Top)Covid-19 Tri-Sucrose Vaccine 12/24/2020   PFIZER(Purple Top)SARS-COV-2 Vaccination 09/05/2019, 09/26/2019, 05/19/2020   Pfizer Covid-19 Vaccine Bivalent Booster 51yr & up 06/22/2021   Pneumococcal Conjugate-13 02/20/2014   Pneumococcal Polysaccharide-23 01/20/1997, 06/24/2016   Td 02/20/2003   Tdap 02/20/2014  TDAP status: Up to date  Flu Vaccine status: Up to date  Pneumococcal vaccine status: Up to date  Covid-19 vaccine status: Completed vaccines  Qualifies for Shingles Vaccine? Yes   Zostavax completed No   Shingrix Completed?: No.    Education has been provided regarding the importance of this vaccine. Patient has been advised to call insurance company to determine out of pocket expense if they have not yet received this vaccine. Advised may also receive vaccine at local pharmacy or Health Dept. Verbalized acceptance and understanding.  Screening Tests Health Maintenance  Topic Date Due   OPHTHALMOLOGY EXAM  04/06/2022   COVID-19 Vaccine (6 - Pfizer series) 07/08/2022 (Originally 10/20/2021)   Zoster Vaccines- Shingrix (1 of 2) 09/22/2022 (Originally 02/07/1984)   INFLUENZA VACCINE  11/20/2022 (Originally 03/22/2022)   FOOT EXAM  07/09/2022   HEMOGLOBIN A1C  08/17/2022   Medicare Annual Wellness (AWV)  06/23/2023   TETANUS/TDAP  02/21/2024   Pneumonia Vaccine 81+ Years old  Completed   DEXA SCAN  Completed   HPV VACCINES  Aged Out    Health Maintenance  Health Maintenance Due  Topic Date Due   OPHTHALMOLOGY EXAM  04/06/2022    Colorectal cancer screening: No longer required.   Mammogram status: No longer required due to Age.  Bone Density status:  Completed 10/19/21. Results reflect: Bone density results: OSTEOPOROSIS. Repeat every   years.  Lung Cancer Screening: (Low Dose CT Chest recommended if Age 48-80 years, 30 pack-year currently smoking OR have quit w/in 15years.) does not qualify.     Additional Screening:  Hepatitis C Screening: does not qualify; Completed   Vision Screening: Recommended annual ophthalmology exams for early detection of glaucoma and other disorders of the eye. Is the patient up to date with their annual eye exam?  Yes  Who is the provider or what is the name of the office in which the patient attends annual eye exams? Dr Katy Fitch If pt is not established with a provider, would they like to be referred to a provider to establish care? No .   Dental Screening: Recommended annual dental exams for proper oral hygiene  Community Resource Referral / Chronic Care Management:  CRR required this visit?  No   CCM required this visit?  No      Plan:     I have personally reviewed and noted the following in the patient's chart:   Medical and social history Use of alcohol, tobacco or illicit drugs  Current medications and supplements including opioid prescriptions. Patient is not currently taking opioid prescriptions. Functional ability and status Nutritional status Physical activity Advanced directives List of other physicians Hospitalizations, surgeries, and ER visits in previous 12 months Vitals Screenings to include cognitive, depression, and falls Referrals and appointments  In addition, I have reviewed and discussed with patient certain preventive protocols, quality metrics, and best practice recommendations. A written personalized care plan for preventive services as well as general preventive health recommendations were provided to patient.     Criselda Peaches, LPN   [id number]   Nurse Notes: None

## 2022-07-25 ENCOUNTER — Encounter: Payer: Self-pay | Admitting: Family Medicine

## 2022-07-25 ENCOUNTER — Ambulatory Visit: Payer: Medicare PPO | Admitting: Family Medicine

## 2022-07-25 VITALS — BP 130/70 | HR 94 | Temp 98.6°F | Ht 62.0 in | Wt 170.2 lb

## 2022-07-25 DIAGNOSIS — R051 Acute cough: Secondary | ICD-10-CM

## 2022-07-25 DIAGNOSIS — J441 Chronic obstructive pulmonary disease with (acute) exacerbation: Secondary | ICD-10-CM

## 2022-07-25 DIAGNOSIS — J208 Acute bronchitis due to other specified organisms: Secondary | ICD-10-CM

## 2022-07-25 LAB — POC COVID19 BINAXNOW: SARS Coronavirus 2 Ag: NEGATIVE

## 2022-07-25 MED ORDER — PREDNISONE 20 MG PO TABS
ORAL_TABLET | ORAL | 0 refills | Status: DC
Start: 2022-07-25 — End: 2022-08-06

## 2022-07-25 MED ORDER — DOXYCYCLINE HYCLATE 100 MG PO TABS: 100.0000 mg | ORAL_TABLET | Freq: Two times a day (BID) | ORAL | 0 refills | Status: DC

## 2022-07-25 NOTE — Progress Notes (Unsigned)
Jill [name] T. Jill Gillies, MD, [place] at [facility] [place], [address]  Phone: [phone]  FAX: [phone]  LY WASS - 88 y.o. female  MRN [id number]  Date of Birth: 1932-03-03  Date: 07/25/2022  PCP: Jinny Sanders, MD  Referral: Jinny Sanders, MD  Chief Complaint  Patient presents with   Cough    X 1 week Dry No Covid Test   Wheezing        Shortness of Breath        Subjective:   Jill [name] is a 88 y.o. very pleasant female patient with Body mass index is 31.14 kg/m. who presents with the following:  Feeling pretty bad - cough is the worst.  Has had a deep cough.  DIstant, but a smoker for many years.   Very pleasant lady who I have seen over the years, she presents today with 1 week worth of cough that is productive.  She also has some shortness of breath and wheezing.  She does have a diagnosis of COPD and her problem list, and she was a smoker for many years, though she quit roughly 20 years ago.  She uses Spiriva at baseline, as well as Ventolin.  Review of Systems is noted in the HPI, as appropriate  Objective:   BP 130/70   Pulse 94   Temp 98.6 F (37 C) (Oral)   Ht '5\' 2"'$  (1.575 m)   Wt 170 lb 4 oz (77.2 kg)   SpO2 95%   BMI 31.14 kg/m    Gen: WDWN, cooperative. Globally Non-toxic HEENT: Normocephalic and atraumatic. Throat clear, w/o exudate, R TM clear, L TM - good landmarks, No fluid present. rhinnorhea. No frontal or maxillary sinus T. MMM NECK: Anterior cervical  LAD is present CV: RRR, No M/G/R, cap refill <2 sec PULM: Breathing comfortably in no respiratory distress.  She does have some mild but diffuse respiratory wheezes.  There are some scattered rhonchi, as well without focal crackles. ABD: S,NT,ND,+BS. No HSM. No rebound. MSK: Nml gait   Laboratory and Imaging Data:  Assessment and Plan:     ICD-10-CM   1. COPD exacerbation ([place])  J44.1     2. Acute cough   R05.1 POC COVID-19    3. Acute bronchitis due to other specified organisms  J20.8      COPD exacerbation brought on by acute illness.  Respiratory pathogen had some sort, cannot exclude viral versus bacterial infection.  Given age and comorbidities, I am going to place her on antibiotics as well as to start her on some oral prednisone.  She will continue using her as needed inhaler at home  Medication Management during today's office visit: Meds ordered this encounter  Medications   doxycycline (VIBRA-TABS) 100 MG tablet    Sig: Take 1 tablet (100 mg total) by mouth 2 (two) times daily.    Dispense:  20 tablet    Refill:  0   predniSONE (DELTASONE) 20 MG tablet    Sig: 2 tabs po daily for 5 days, then 1 tab po daily for 5 days    Dispense:  15 tablet    Refill:  0   There are no discontinued medications.  Orders placed today for conditions managed today: Orders Placed This Encounter  Procedures   POC COVID-19    Disposition: No follow-ups on file.  [employer] speech-to-text software was used for transcription in this dictation.  Possible transcriptional errors can occur using Editor, commissioning.   Signed,  Jill Deed. Ma Munoz, MD   Outpatient Encounter Medications as of 07/25/2022  Medication Sig   acetaminophen (TYLENOL) 500 MG tablet Take 1,000 mg by mouth every 6 (six) hours as needed for moderate pain or headache.   albuterol (VENTOLIN HFA) 108 (90 Base) MCG/ACT inhaler Inhale 2 puffs into the lungs every 6 (six) hours as needed for wheezing or shortness of breath.   allopurinol (ZYLOPRIM) 100 MG tablet TAKE 1/2 TABLET BY MOUTH EVERY OTHER DAY   Cranberry 500 MG CAPS Take by mouth.   diclofenac Sodium (VOLTAREN) 1 % GEL Apply 2 g topically 4 (four) times daily as needed.   donepezil (ARICEPT) 10 MG tablet TAKE 1 TABLET BY MOUTH EVERYDAY AT BEDTIME   escitalopram (LEXAPRO) 20 MG tablet TAKE 1 TABLET BY MOUTH EVERY DAY   estradiol (ESTRACE VAGINAL) 0.1 MG/GM  vaginal cream Apply 0.'5mg'$  (pea-sized amount)  just inside the vaginal introitus with a finger-tip on Monday, Wednesday and Friday nights.   hydrOXYzine (ATARAX/VISTARIL) 10 MG tablet TAKE 0.5-1 TABLETS (5-10 MG TOTAL) BY MOUTH DAILY AS NEEDED FOR ANXIETY.   loratadine (CLARITIN) 10 MG tablet Take by mouth.   Multiple Vitamins-Minerals (MULTIVITAMIN WITH MINERALS) tablet Take 1 tablet by mouth daily.   Probiotic Product (PROBIOTIC DAILY PO) Take by mouth.   TART CHERRY PO Take 1 tablet by mouth in the morning and at bedtime.   tiotropium (SPIRIVA HANDIHALER) 18 MCG inhalation capsule INHALE 1 CAPSULE VIA HANDIHALER ONCE DAILY AT THE SAME TIME EVERY DAY   Vitamin D, Ergocalciferol, (DRISDOL) 1.25 MG (50000 UNIT) CAPS capsule TAKE 1 CAPSULE (50,000 UNITS TOTAL) BY MOUTH EVERY FRIDAY.   doxycycline (VIBRA-TABS) 100 MG tablet Take 1 tablet (100 mg total) by mouth 2 (two) times daily.   predniSONE (DELTASONE) 20 MG tablet 2 tabs po daily for 5 days, then 1 tab po daily for 5 days   No facility-administered encounter medications on file as of 07/25/2022.

## 2022-08-01 ENCOUNTER — Emergency Department: Payer: Medicare PPO

## 2022-08-01 ENCOUNTER — Other Ambulatory Visit: Payer: Self-pay

## 2022-08-01 ENCOUNTER — Emergency Department
Admission: EM | Admit: 2022-08-01 | Discharge: 2022-08-01 | Disposition: A | Discharge status: Home / Self Care | Payer: Medicare PPO | Attending: Emergency Medicine | Admitting: Emergency Medicine

## 2022-08-01 ENCOUNTER — Ambulatory Visit: Payer: Medicare PPO | Admitting: Family Medicine

## 2022-08-01 ENCOUNTER — Encounter: Payer: Self-pay | Admitting: Family Medicine

## 2022-08-01 VITALS — BP 110/62 | HR 101 | Temp 97.2°F | Ht 62.0 in | Wt 168.0 lb

## 2022-08-01 DIAGNOSIS — K5730 Diverticulosis of large intestine without perforation or abscess without bleeding: Secondary | ICD-10-CM | POA: Diagnosis not present

## 2022-08-01 DIAGNOSIS — I129 Hypertensive chronic kidney disease with stage 1 through stage 4 chronic kidney disease, or unspecified chronic kidney disease: Secondary | ICD-10-CM | POA: Diagnosis not present

## 2022-08-01 DIAGNOSIS — Z1152 Encounter for screening for COVID-19: Secondary | ICD-10-CM | POA: Insufficient documentation

## 2022-08-01 DIAGNOSIS — R059 Cough, unspecified: Secondary | ICD-10-CM | POA: Diagnosis not present

## 2022-08-01 DIAGNOSIS — K8689 Other specified diseases of pancreas: Secondary | ICD-10-CM | POA: Diagnosis not present

## 2022-08-01 DIAGNOSIS — N3090 Cystitis, unspecified without hematuria: Secondary | ICD-10-CM | POA: Diagnosis not present

## 2022-08-01 DIAGNOSIS — R531 Weakness: Secondary | ICD-10-CM | POA: Diagnosis not present

## 2022-08-01 DIAGNOSIS — E1122 Type 2 diabetes mellitus with diabetic chronic kidney disease: Secondary | ICD-10-CM | POA: Insufficient documentation

## 2022-08-01 DIAGNOSIS — N390 Urinary tract infection, site not specified: Secondary | ICD-10-CM | POA: Diagnosis not present

## 2022-08-01 DIAGNOSIS — R918 Other nonspecific abnormal finding of lung field: Secondary | ICD-10-CM | POA: Diagnosis not present

## 2022-08-01 DIAGNOSIS — N189 Chronic kidney disease, unspecified: Secondary | ICD-10-CM | POA: Diagnosis not present

## 2022-08-01 DIAGNOSIS — E860 Dehydration: Secondary | ICD-10-CM | POA: Diagnosis not present

## 2022-08-01 DIAGNOSIS — D72829 Elevated white blood cell count, unspecified: Secondary | ICD-10-CM | POA: Diagnosis not present

## 2022-08-01 DIAGNOSIS — R0602 Shortness of breath: Secondary | ICD-10-CM | POA: Diagnosis present

## 2022-08-01 DIAGNOSIS — B961 Klebsiella pneumoniae [K. pneumoniae] as the cause of diseases classified elsewhere: Secondary | ICD-10-CM | POA: Diagnosis not present

## 2022-08-01 LAB — CBC WITH DIFFERENTIAL/PLATELET
Abs Immature Granulocytes: 0.37 10*3/uL — ABNORMAL HIGH (ref 0.00–0.07)
Basophils Absolute: 0 10*3/uL (ref 0.0–0.1)
Basophils Relative: 0 %
Eosinophils Absolute: 0 10*3/uL (ref 0.0–0.5)
Eosinophils Relative: 0 %
HCT: 41.2 % (ref 36.0–46.0)
Hemoglobin: 13.2 g/dL (ref 12.0–15.0)
Immature Granulocytes: 2 %
Lymphocytes Relative: 53 %
Lymphs Abs: 12.5 10*3/uL — ABNORMAL HIGH (ref 0.7–4.0)
MCH: 29.8 pg (ref 26.0–34.0)
MCHC: 32 g/dL (ref 30.0–36.0)
MCV: 93 fL (ref 80.0–100.0)
Monocytes Absolute: 1.4 10*3/uL — ABNORMAL HIGH (ref 0.1–1.0)
Monocytes Relative: 6 %
Neutro Abs: 9.1 10*3/uL — ABNORMAL HIGH (ref 1.7–7.7)
Neutrophils Relative %: 39 %
Platelets: 304 10*3/uL (ref 150–400)
RBC: 4.43 MIL/uL (ref 3.87–5.11)
RDW: 13.2 % (ref 11.5–15.5)
Smear Review: NORMAL
WBC Morphology: ABNORMAL
WBC: 23.4 10*3/uL — ABNORMAL HIGH (ref 4.0–10.5)
nRBC: 0 % (ref 0.0–0.2)

## 2022-08-01 LAB — URINALYSIS, COMPLETE (UACMP) WITH MICROSCOPIC
Bilirubin Urine: NEGATIVE
Glucose, UA: NEGATIVE mg/dL
Hgb urine dipstick: NEGATIVE
Ketones, ur: NEGATIVE mg/dL
Nitrite: NEGATIVE
Protein, ur: 30 mg/dL — AB
Specific Gravity, Urine: 1.012 (ref 1.005–1.030)
pH: 5 (ref 5.0–8.0)

## 2022-08-01 LAB — COMPREHENSIVE METABOLIC PANEL
ALT: 14 U/L (ref 0–44)
AST: 13 U/L — ABNORMAL LOW (ref 15–41)
Albumin: 3.5 g/dL (ref 3.5–5.0)
Alkaline Phosphatase: 95 U/L (ref 38–126)
Anion gap: 9 (ref 5–15)
BUN: 66 mg/dL — ABNORMAL HIGH (ref 8–23)
CO2: 24 mmol/L (ref 22–32)
Calcium: 9.6 mg/dL (ref 8.9–10.3)
Chloride: 102 mmol/L (ref 98–111)
Creatinine, Ser: 2 mg/dL — ABNORMAL HIGH (ref 0.44–1.00)
GFR, Estimated: 24 mL/min — ABNORMAL LOW (ref >60)
Glucose, Bld: 388 mg/dL — ABNORMAL HIGH (ref 70–99)
Potassium: 4.6 mmol/L (ref 3.5–5.1)
Sodium: 135 mmol/L (ref 135–145)
Total Bilirubin: 0.7 mg/dL (ref 0.3–1.2)
Total Protein: 6.6 g/dL (ref 6.5–8.1)

## 2022-08-01 LAB — APTT: aPTT: 22 seconds — ABNORMAL LOW (ref 24–36)

## 2022-08-01 LAB — RESP PANEL BY RT-PCR (RSV, FLU A&B, COVID)  RVPGX2
Influenza A by PCR: NEGATIVE
Influenza B by PCR: NEGATIVE
Resp Syncytial Virus by PCR: NEGATIVE
SARS Coronavirus 2 by RT PCR: NEGATIVE

## 2022-08-01 LAB — PROTIME-INR
INR: 1.1 (ref 0.8–1.2)
Prothrombin Time: 13.9 seconds (ref 11.4–15.2)

## 2022-08-01 LAB — LACTIC ACID, PLASMA
Lactic Acid, Venous: 1.2 mmol/L (ref 0.5–1.9)
Lactic Acid, Venous: 1.3 mmol/L (ref 0.5–1.9)

## 2022-08-01 LAB — TROPONIN I (HIGH SENSITIVITY)
Troponin I (High Sensitivity): 14 ng/L (ref <18)
Troponin I (High Sensitivity): 13 ng/L (ref <18)

## 2022-08-01 MED ORDER — SODIUM CHLORIDE 0.9 % IV SOLN
1.0000 g | Freq: Once | INTRAVENOUS | Status: AC
  Administered 2022-08-01: 1 g via INTRAVENOUS
  Filled 2022-08-01: qty 10

## 2022-08-01 MED ORDER — CEFDINIR 300 MG PO CAPS: 300.0000 mg | ORAL_CAPSULE | Freq: Two times a day (BID) | ORAL | 0 refills | Status: DC

## 2022-08-01 MED ORDER — SODIUM CHLORIDE 0.9 % IV BOLUS: 500.0000 mL | Freq: Once | INTRAVENOUS | Status: DC

## 2022-08-01 MED ORDER — SODIUM CHLORIDE 0.9 % IV BOLUS
1000.0000 mL | Freq: Once | INTRAVENOUS | Status: AC
  Administered 2022-08-01: 1000 mL via INTRAVENOUS

## 2022-08-01 NOTE — Progress Notes (Signed)
Has 1 dose of doxy left.  Still on prednisone taper.   She has been sick for about 2 weeks.  She doesn't usually use a wheelchair, in wheelchair today.  Usually barely uses a cane.  Cough is worse in the last 2 weeks, in spite of doxy and prednisone.  No fevers.  No recollection of chills in the last few days.  Hasn't complained of chills in the last few days.  No sputum.    Meds, vitals, and allergies reviewed.   ROS: Per HPI unless specifically indicated in ROS section   In wheelchair ncat Tired appearing Ncat Neck supple, no LA faint rhonchi on the RLL.   Mild retractions noted, supraclavicular Abd soft Skin warm and dry.  Nailbeds not cyanotic.    Current Outpatient Medications on File Prior to Visit  Medication Sig Dispense Refill   acetaminophen (TYLENOL) 500 MG tablet Take 1,000 mg by mouth every 6 (six) hours as needed for moderate pain or headache.     albuterol (VENTOLIN HFA) 108 (90 Base) MCG/ACT inhaler Inhale 2 puffs into the lungs every 6 (six) hours as needed for wheezing or shortness of breath. 8 g 0   allopurinol (ZYLOPRIM) 100 MG tablet TAKE 1/2 TABLET BY MOUTH EVERY OTHER DAY 45 tablet 0   Cranberry 500 MG CAPS Take by mouth.     diclofenac Sodium (VOLTAREN) 1 % GEL Apply 2 g topically 4 (four) times daily as needed.     donepezil (ARICEPT) 10 MG tablet TAKE 1 TABLET BY MOUTH EVERYDAY AT BEDTIME 90 tablet 1   doxycycline (VIBRA-TABS) 100 MG tablet Take 1 tablet (100 mg total) by mouth 2 (two) times daily. 20 tablet 0   escitalopram (LEXAPRO) 20 MG tablet TAKE 1 TABLET BY MOUTH EVERY DAY 90 tablet 1   estradiol (ESTRACE VAGINAL) 0.1 MG/GM vaginal cream Apply 0.'5mg'$  (pea-sized amount)  just inside the vaginal introitus with a finger-tip on Monday, Wednesday and Friday nights. 30 g 12   hydrOXYzine (ATARAX/VISTARIL) 10 MG tablet TAKE 0.5-1 TABLETS (5-10 MG TOTAL) BY MOUTH DAILY AS NEEDED FOR ANXIETY. 90 tablet 0   loratadine (CLARITIN) 10 MG tablet Take by mouth.      Multiple Vitamins-Minerals (MULTIVITAMIN WITH MINERALS) tablet Take 1 tablet by mouth daily.     predniSONE (DELTASONE) 20 MG tablet 2 tabs po daily for 5 days, then 1 tab po daily for 5 days 15 tablet 0   Probiotic Product (PROBIOTIC DAILY PO) Take by mouth.     TART CHERRY PO Take 1 tablet by mouth in the morning and at bedtime.     tiotropium (SPIRIVA HANDIHALER) 18 MCG inhalation capsule INHALE 1 CAPSULE VIA HANDIHALER ONCE DAILY AT THE SAME TIME EVERY DAY 30 capsule 5   Vitamin D, Ergocalciferol, (DRISDOL) 1.25 MG (50000 UNIT) CAPS capsule TAKE 1 CAPSULE (50,000 UNITS TOTAL) BY MOUTH EVERY FRIDAY. 12 capsule 3   No current facility-administered medications on file prior to visit.

## 2022-08-01 NOTE — ED Notes (Signed)
First Nurse Note: Pt to ED via [facility] from MD office for cough x 2 weeks that has not gotten better.  BP: 132/90 HR- 68 SpO2 100% Pt does not wear oxygen at home, EMS reports that pt was on 4 liters when they picked her up and they did not take her off of it

## 2022-08-01 NOTE — Discharge Instructions (Signed)
Your tests today show signs of a bladder infection and dehydration.  Use a walker for extra support until you are feeling better.  Continue to eat regularly and drink plenty of fluids.

## 2022-08-01 NOTE — ED Provider Triage Note (Signed)
Emergency Medicine Provider Triage Evaluation Note  Jill [name] , a 88 y.o. female  was evaluated in triage.  Pt complains of weakness, been on Doxy for 2 weeks.  Review of Systems  Positive:  Negative:   Physical Exam  BP (!) 192/83 (BP Location: Left Arm)   Pulse 65   Temp (!) 97.5 F (36.4 C) (Oral)   Resp 16   Ht '5\' 2"'$  (1.575 m)   Wt 76.2 kg   SpO2 97%   BMI 30.73 kg/m  Gen:   Awake, no distress   Resp:  Normal effort  MSK:   Moves extremities without difficulty  Other:    Medical Decision Making  Medically screening exam initiated at 2:31 PM.  Appropriate orders placed.  Jill [name] was informed that the remainder of the evaluation will be completed by another provider, this initial triage assessment does not replace that evaluation, and the importance of remaining in the ED until their evaluation is complete.     Versie Starks, PA-C 08/01/22 1431

## 2022-08-01 NOTE — ED Provider Notes (Signed)
[facility] Provider Note    Event Date/Time   First MD Initiated Contact with Patient 08/01/22 1543     (approximate)   History   Chief Complaint: Shortness of Breath   HPI  Jill [name] is a 88 y.o. female with a history of hypertension, CKD, diabetes, hyperlipidemia who comes the ED today complaining of increasing weakness associated with cough.  Spouse at bedside reports the patient was treated for bronchitis 2 weeks ago with prednisone, doxycycline, completed the courses of medication, but seems to have an increased cough.  She normally ambulates with a cane but recently has needed more assistance using a walker or needing help from another person.  No falls, no trauma.  No fever.  No chest pain.  Denies any other pain complaints.  No vomiting or diarrhea.  She has had decreased fluid intake over the last few days but is eating okay     Physical Exam   Triage Vital Signs: ED Triage Vitals  Enc Vitals Group     BP 08/01/22 1427 (!) 192/83     Pulse Rate 08/01/22 1427 65     Resp 08/01/22 1427 16     Temp 08/01/22 1427 (!) 97.5 F (36.4 C)     Temp Source 08/01/22 1427 Oral     SpO2 08/01/22 1427 97 %     Weight 08/01/22 1425 168 lb (76.2 kg)     Height 08/01/22 1425 '5\' 2"'$  (1.575 m)     Head Circumference --      Peak Flow --      Pain Score 08/01/22 1425 0     Pain Loc --      Pain Edu? --      Excl. in [place]? --     Most recent vital signs: Vitals:   08/01/22 1427  BP: (!) 192/83  Pulse: 65  Resp: 16  Temp: (!) 97.5 F (36.4 C)  SpO2: 97%    General: Awake, no distress.  Not oriented, baseline per patient and spouse CV:  Good peripheral perfusion.  Regular rate and rhythm, normal distal pulses Resp:  Normal effort.  Clear to auscultation bilaterally except for sparse crackles at bilateral bases.  No wheezing.  Normal expiratory phase.  Good bilateral air movement. Abd:  No distention.  Soft nontender Other:  No lower extremity  edema, no calf tenderness.  No inflammatory skin changes.  Somewhat dry mucous membranes.  Nontoxic.   ED Results / Procedures / Treatments   Labs (all labs ordered are listed, but only abnormal results are displayed) Labs Reviewed  COMPREHENSIVE METABOLIC PANEL - Abnormal; Notable for the following components:      Result Value   Glucose, Bld 388 (*)    BUN 66 (*)    Creatinine, Ser 2.00 (*)    AST 13 (*)    GFR, Estimated 24 (*)    All other components within normal limits  CBC WITH DIFFERENTIAL/PLATELET - Abnormal; Notable for the following components:   WBC 23.4 (*)    Neutro Abs 9.1 (*)    Lymphs Abs 12.5 (*)    Monocytes Absolute 1.4 (*)    Abs Immature Granulocytes 0.37 (*)    All other components within normal limits  APTT - Abnormal; Notable for the following components:   aPTT 22 (*)    All other components within normal limits  URINALYSIS, COMPLETE (UACMP) WITH MICROSCOPIC - Abnormal; Notable for the following components:   Color, Urine YELLOW (*)  APPearance HAZY (*)    Protein, ur 30 (*)    Leukocytes,Ua MODERATE (*)    Bacteria, UA MANY (*)    All other components within normal limits  RESP PANEL BY RT-PCR (RSV, FLU A&B, COVID)  RVPGX2  URINE CULTURE  LACTIC ACID, PLASMA  PROTIME-INR  LACTIC ACID, PLASMA  PATHOLOGIST SMEAR REVIEW  TROPONIN I (HIGH SENSITIVITY)  TROPONIN I (HIGH SENSITIVITY)     EKG Interpreted by me Normal sinus rhythm rate of 65.  Normal axis intervals QRS ST segments and T waves.   RADIOLOGY Chest x-ray interpreted by me, appears normal.  Radiology report reviewed.  CT chest unremarkable   PROCEDURES:  Procedures   MEDICATIONS ORDERED IN ED: Medications  cefTRIAXone (ROCEPHIN) 1 g in sodium chloride 0.9 % 100 mL IVPB (has no administration in time range)  sodium chloride 0.9 % bolus 1,000 mL (0 mLs Intravenous Stopped 08/01/22 1822)     IMPRESSION / MDM / ASSESSMENT AND PLAN / ED COURSE  I reviewed the triage vital  signs and the nursing notes.                              Differential diagnosis includes, but is not limited to, pneumonia, pleural effusion, pulmonary edema, viral illness, dehydration, AKI, anemia, electrolyte abnormality  Patient's presentation is most consistent with acute presentation with potential threat to life or bodily function.  Patient presents with generalized weakness/fatigue as well as subacute cough since recent episode of bronchitis.  Already completed a course of prednisone and doxycycline.  Vital signs unremarkable except for hypertension.  Not septic.  Oxygenation is normal.  Labs showed normal troponin, COVID flu RSV negative, stable CKD with elevated BUN suggestive of dehydration.  Imaging is negative including chest x-ray and CT chest.  If urinalysis is not consistent with a UTI, I think the leukocytosis is most likely due to recent steroid use.   ----------------------------------------- 7:54 PM on 08/01/2022 ----------------------------------------- UA does show signs of UTI.  Will give Rocephin and started on cefdinir.  UA also has oxalate crystals, will obtain CT abdomen to rule out ureteral obstruction.  She is otherwise stable and suitable for outpatient management.     FINAL CLINICAL IMPRESSION(S) / ED DIAGNOSES   Final diagnoses:  Cystitis  Dehydration     Rx / DC Orders   ED Discharge Orders          Ordered    cefdinir (OMNICEF) 300 MG capsule  2 times daily        08/01/22 1953             Note:  This document was prepared using Dragon voice recognition software and may include unintentional dictation errors.   Carrie Mew, MD 08/01/22 [name]

## 2022-08-01 NOTE — Assessment & Plan Note (Signed)
Concern for PNA.  Deconditioned, now not ambulatory and this is a recent change.  Discussed options, failed outpatient tx, 911 called for EMS transport to ER.  Patient agreed.  Started on 2L O2 via [place] and recheck pulse ox 97%.  Will be monitored until EMS arrival.

## 2022-08-01 NOTE — ED Notes (Signed)
E signature pad not working. Pt educated on discharge instructions and verbalized understanding.  

## 2022-08-01 NOTE — ED Triage Notes (Signed)
Sent by PCP due to increased weakness and worsening cough.  Dx with bronchitis and prescribed doxy.  Patient has been sick for 2 weeks.  Reports she usually doesn't use a wheelchair or a cane but has recently started needing it.  PCP wrote they heard rhonchi in R[place].  Patient denies hurting anywhere.

## 2022-08-02 LAB — PATHOLOGIST SMEAR REVIEW

## 2022-08-04 ENCOUNTER — Telehealth: Payer: Self-pay | Admitting: *Deleted

## 2022-08-04 LAB — URINE CULTURE: Culture: >=100000 — AB

## 2022-08-04 NOTE — Patient Outreach (Signed)
  Care Coordination [facility] Note Transition Care Management Unsuccessful Follow-up Telephone Call  Date of discharge and from where:  [phone] [facility] ER  Attempts:  1st Attempt  Reason for unsuccessful TCM follow-up call:  Left voice message  [place] [facility] [phone]

## 2022-08-05 ENCOUNTER — Telehealth: Payer: Self-pay | Admitting: *Deleted

## 2022-08-05 ENCOUNTER — Inpatient Hospital Stay (HOSPITAL_BASED_OUTPATIENT_CLINIC_OR_DEPARTMENT_OTHER)
Admission: EM | Admit: 2022-08-05 | Discharge: 2022-08-09 | DRG: 070 | Disposition: A | Discharge status: Home Health | Payer: Medicare PPO | Attending: Family Medicine | Admitting: Family Medicine

## 2022-08-05 ENCOUNTER — Encounter: Payer: Self-pay | Admitting: Family Medicine

## 2022-08-05 ENCOUNTER — Other Ambulatory Visit: Payer: Self-pay

## 2022-08-05 ENCOUNTER — Encounter (HOSPITAL_BASED_OUTPATIENT_CLINIC_OR_DEPARTMENT_OTHER): Payer: Self-pay | Admitting: Emergency Medicine

## 2022-08-05 ENCOUNTER — Encounter (HOSPITAL_COMMUNITY): Payer: Self-pay

## 2022-08-05 ENCOUNTER — Emergency Department (HOSPITAL_BASED_OUTPATIENT_CLINIC_OR_DEPARTMENT_OTHER): Payer: Medicare PPO | Admitting: Radiology

## 2022-08-05 ENCOUNTER — Emergency Department (HOSPITAL_BASED_OUTPATIENT_CLINIC_OR_DEPARTMENT_OTHER): Payer: Medicare PPO

## 2022-08-05 DIAGNOSIS — J440 Chronic obstructive pulmonary disease with acute lower respiratory infection: Secondary | ICD-10-CM | POA: Diagnosis present

## 2022-08-05 DIAGNOSIS — Z20822 Contact with and (suspected) exposure to covid-19: Secondary | ICD-10-CM | POA: Diagnosis present

## 2022-08-05 DIAGNOSIS — I2510 Atherosclerotic heart disease of native coronary artery without angina pectoris: Secondary | ICD-10-CM | POA: Diagnosis present

## 2022-08-05 DIAGNOSIS — E1122 Type 2 diabetes mellitus with diabetic chronic kidney disease: Secondary | ICD-10-CM | POA: Diagnosis present

## 2022-08-05 DIAGNOSIS — N390 Urinary tract infection, site not specified: Secondary | ICD-10-CM | POA: Diagnosis present

## 2022-08-05 DIAGNOSIS — G9341 Metabolic encephalopathy: Secondary | ICD-10-CM | POA: Diagnosis present

## 2022-08-05 DIAGNOSIS — N63 Unspecified lump in unspecified breast: Secondary | ICD-10-CM | POA: Diagnosis present

## 2022-08-05 DIAGNOSIS — R059 Cough, unspecified: Secondary | ICD-10-CM | POA: Diagnosis not present

## 2022-08-05 DIAGNOSIS — E1169 Type 2 diabetes mellitus with other specified complication: Secondary | ICD-10-CM | POA: Diagnosis present

## 2022-08-05 DIAGNOSIS — J181 Lobar pneumonia, unspecified organism: Secondary | ICD-10-CM | POA: Diagnosis present

## 2022-08-05 DIAGNOSIS — Z792 Long term (current) use of antibiotics: Secondary | ICD-10-CM

## 2022-08-05 DIAGNOSIS — E1159 Type 2 diabetes mellitus with other circulatory complications: Secondary | ICD-10-CM | POA: Diagnosis present

## 2022-08-05 DIAGNOSIS — Z825 Family history of asthma and other chronic lower respiratory diseases: Secondary | ICD-10-CM

## 2022-08-05 DIAGNOSIS — J449 Chronic obstructive pulmonary disease, unspecified: Secondary | ICD-10-CM | POA: Diagnosis present

## 2022-08-05 DIAGNOSIS — I152 Hypertension secondary to endocrine disorders: Secondary | ICD-10-CM | POA: Diagnosis present

## 2022-08-05 DIAGNOSIS — Z90710 Acquired absence of both cervix and uterus: Secondary | ICD-10-CM

## 2022-08-05 DIAGNOSIS — R7881 Bacteremia: Secondary | ICD-10-CM | POA: Diagnosis present

## 2022-08-05 DIAGNOSIS — N6310 Unspecified lump in the right breast, unspecified quadrant: Secondary | ICD-10-CM | POA: Diagnosis present

## 2022-08-05 DIAGNOSIS — F03A0 Unspecified dementia, mild, without behavioral disturbance, psychotic disturbance, mood disturbance, and anxiety: Secondary | ICD-10-CM | POA: Diagnosis present

## 2022-08-05 DIAGNOSIS — Z885 Allergy status to narcotic agent status: Secondary | ICD-10-CM

## 2022-08-05 DIAGNOSIS — N179 Acute kidney failure, unspecified: Secondary | ICD-10-CM | POA: Diagnosis present

## 2022-08-05 DIAGNOSIS — E785 Hyperlipidemia, unspecified: Secondary | ICD-10-CM | POA: Diagnosis present

## 2022-08-05 DIAGNOSIS — Z1611 Resistance to penicillins: Secondary | ICD-10-CM | POA: Diagnosis present

## 2022-08-05 DIAGNOSIS — Z833 Family history of diabetes mellitus: Secondary | ICD-10-CM

## 2022-08-05 DIAGNOSIS — Z803 Family history of malignant neoplasm of breast: Secondary | ICD-10-CM

## 2022-08-05 DIAGNOSIS — R4182 Altered mental status, unspecified: Secondary | ICD-10-CM

## 2022-08-05 DIAGNOSIS — F339 Major depressive disorder, recurrent, unspecified: Secondary | ICD-10-CM | POA: Diagnosis present

## 2022-08-05 DIAGNOSIS — Z87891 Personal history of nicotine dependence: Secondary | ICD-10-CM

## 2022-08-05 DIAGNOSIS — R410 Disorientation, unspecified: Secondary | ICD-10-CM | POA: Diagnosis not present

## 2022-08-05 DIAGNOSIS — B961 Klebsiella pneumoniae [K. pneumoniae] as the cause of diseases classified elsewhere: Secondary | ICD-10-CM | POA: Diagnosis present

## 2022-08-05 DIAGNOSIS — D72820 Lymphocytosis (symptomatic): Secondary | ICD-10-CM | POA: Diagnosis present

## 2022-08-05 DIAGNOSIS — F03B3 Unspecified dementia, moderate, with mood disturbance: Secondary | ICD-10-CM | POA: Diagnosis present

## 2022-08-05 DIAGNOSIS — Z7952 Long term (current) use of systemic steroids: Secondary | ICD-10-CM

## 2022-08-05 DIAGNOSIS — Z888 Allergy status to other drugs, medicaments and biological substances status: Secondary | ICD-10-CM

## 2022-08-05 DIAGNOSIS — Z66 Do not resuscitate: Secondary | ICD-10-CM | POA: Diagnosis present

## 2022-08-05 DIAGNOSIS — Z8249 Family history of ischemic heart disease and other diseases of the circulatory system: Secondary | ICD-10-CM

## 2022-08-05 DIAGNOSIS — N184 Chronic kidney disease, stage 4 (severe): Secondary | ICD-10-CM | POA: Diagnosis present

## 2022-08-05 DIAGNOSIS — F03B2 Unspecified dementia, moderate, with psychotic disturbance: Secondary | ICD-10-CM | POA: Diagnosis present

## 2022-08-05 DIAGNOSIS — Z96643 Presence of artificial hip joint, bilateral: Secondary | ICD-10-CM | POA: Diagnosis present

## 2022-08-05 DIAGNOSIS — Z882 Allergy status to sulfonamides status: Secondary | ICD-10-CM

## 2022-08-05 DIAGNOSIS — J40 Bronchitis, not specified as acute or chronic: Secondary | ICD-10-CM | POA: Diagnosis present

## 2022-08-05 DIAGNOSIS — D72829 Elevated white blood cell count, unspecified: Secondary | ICD-10-CM | POA: Diagnosis present

## 2022-08-05 DIAGNOSIS — E1140 Type 2 diabetes mellitus with diabetic neuropathy, unspecified: Secondary | ICD-10-CM | POA: Diagnosis present

## 2022-08-05 LAB — RESP PANEL BY RT-PCR (RSV, FLU A&B, COVID)  RVPGX2
Influenza A by PCR: NEGATIVE
Influenza B by PCR: NEGATIVE
Resp Syncytial Virus by PCR: NEGATIVE
SARS Coronavirus 2 by RT PCR: NEGATIVE

## 2022-08-05 LAB — COMPREHENSIVE METABOLIC PANEL
ALT: 11 U/L (ref 0–44)
AST: 11 U/L — ABNORMAL LOW (ref 15–41)
Albumin: 3.9 g/dL (ref 3.5–5.0)
Alkaline Phosphatase: 93 U/L (ref 38–126)
Anion gap: 14 (ref 5–15)
BUN: 67 mg/dL — ABNORMAL HIGH (ref 8–23)
CO2: 22 mmol/L (ref 22–32)
Calcium: 9.6 mg/dL (ref 8.9–10.3)
Chloride: 101 mmol/L (ref 98–111)
Creatinine, Ser: 2.06 mg/dL — ABNORMAL HIGH (ref 0.44–1.00)
GFR, Estimated: 23 mL/min — ABNORMAL LOW (ref >60)
Glucose, Bld: 284 mg/dL — ABNORMAL HIGH (ref 70–99)
Potassium: 4.4 mmol/L (ref 3.5–5.1)
Sodium: 137 mmol/L (ref 135–145)
Total Bilirubin: 0.5 mg/dL (ref 0.3–1.2)
Total Protein: 6.9 g/dL (ref 6.5–8.1)

## 2022-08-05 LAB — URINALYSIS, ROUTINE W REFLEX MICROSCOPIC
Bilirubin Urine: NEGATIVE
Glucose, UA: NEGATIVE mg/dL
Hgb urine dipstick: NEGATIVE
Ketones, ur: NEGATIVE mg/dL
Leukocytes,Ua: NEGATIVE
Nitrite: NEGATIVE
Protein, ur: 30 mg/dL — AB
Specific Gravity, Urine: 1.017 (ref 1.005–1.030)
pH: 5 (ref 5.0–8.0)

## 2022-08-05 LAB — CBC
HCT: 41.6 % (ref 36.0–46.0)
Hemoglobin: 13.4 g/dL (ref 12.0–15.0)
MCH: 30 pg (ref 26.0–34.0)
MCHC: 32.2 g/dL (ref 30.0–36.0)
MCV: 93.1 fL (ref 80.0–100.0)
Platelets: 245 10*3/uL (ref 150–400)
RBC: 4.47 MIL/uL (ref 3.87–5.11)
RDW: 13.7 % (ref 11.5–15.5)
WBC: 31.4 10*3/uL — ABNORMAL HIGH (ref 4.0–10.5)
nRBC: 0 % (ref 0.0–0.2)

## 2022-08-05 LAB — AMMONIA: Ammonia: 23 umol/L (ref 9–35)

## 2022-08-05 LAB — LACTIC ACID, PLASMA: Lactic Acid, Venous: 2 mmol/L (ref 0.5–1.9)

## 2022-08-05 LAB — TSH: TSH: 3.449 u[IU]/mL (ref 0.350–4.500)

## 2022-08-05 LAB — TROPONIN I (HIGH SENSITIVITY)
Troponin I (High Sensitivity): 17 ng/L (ref <18)
Troponin I (High Sensitivity): 11 ng/L (ref <18)

## 2022-08-05 LAB — CBG MONITORING, ED: Glucose-Capillary: 273 mg/dL — ABNORMAL HIGH (ref 70–99)

## 2022-08-05 MED ORDER — ESCITALOPRAM OXALATE 10 MG PO TABS
20.0000 mg | ORAL_TABLET | Freq: Every day | ORAL | Status: DC
  Administered 2022-08-06 – 2022-08-09 (×4): 20 mg via ORAL
  Filled 2022-08-05 (×4): qty 2

## 2022-08-05 MED ORDER — ALLOPURINOL 100 MG PO TABS
50.0000 mg | ORAL_TABLET | ORAL | Status: DC
  Administered 2022-08-06 – 2022-08-08 (×2): 50 mg via ORAL
  Filled 2022-08-05 (×3): qty 1

## 2022-08-05 MED ORDER — ONDANSETRON HCL 4 MG/2ML IJ SOLN
4.0000 mg | Freq: Four times a day (QID) | INTRAMUSCULAR | Status: DC | PRN
  Administered 2022-08-05: 4 mg via INTRAVENOUS
  Filled 2022-08-05: qty 2

## 2022-08-05 MED ORDER — SODIUM CHLORIDE 0.9 % IV SOLN: 1.0000 g | INTRAVENOUS | Status: DC

## 2022-08-05 MED ORDER — DONEPEZIL HCL 10 MG PO TABS
10.0000 mg | ORAL_TABLET | Freq: Every day | ORAL | Status: DC
  Administered 2022-08-06 – 2022-08-08 (×3): 10 mg via ORAL
  Filled 2022-08-05 (×4): qty 1

## 2022-08-05 MED ORDER — SODIUM CHLORIDE 0.9 % IV BOLUS
1000.0000 mL | Freq: Once | INTRAVENOUS | Status: AC
  Administered 2022-08-05: 1000 mL via INTRAVENOUS

## 2022-08-05 MED ORDER — CIPROFLOXACIN IN D5W 400 MG/200ML IV SOLN
400.0000 mg | Freq: Once | INTRAVENOUS | Status: AC
  Administered 2022-08-06: 400 mg via INTRAVENOUS
  Filled 2022-08-05: qty 200

## 2022-08-05 MED ORDER — SODIUM CHLORIDE 0.9 % IV SOLN
1.0000 g | Freq: Once | INTRAVENOUS | Status: DC
  Filled 2022-08-05: qty 10

## 2022-08-05 NOTE — ED Provider Notes (Signed)
[facility] EMERGENCY DEPT Provider Note   CSN: [id number] Arrival date & time: 08/05/22  1715     History  Chief Complaint  Patient presents with   Altered Mental Status    Jill [name] is a 88 y.o. female.  Level 5 caveat for altered mental status.  Patient here with family.  They report she has had a cough for the past 1 week as well as a UTI.  She was first diagnosed with bronchitis on December 4 and completed a course of doxycycline and prednisone.  She was seen at [facility] on December 11 with increased confusion and weakness and diagnosed with a UTI.  She has been taking cefdinir and urine cultures clearing Klebsiella.  Over the past 2 days she has had increased confusion and seeing things that were not there, not knowing what time of day it is, being confused between night and day and not knowing the difference between night and morning.  She is asking strange questions that are not her baseline.  She is oriented x 3 normally but not oriented only to self.  Has had increased weakness since leaving [place] and was told to come back to the hospital by her doctor today.  Patient denies any pain.  She is oriented to person only.  Denies any difficulty breathing.  Denies any chest pain.  No vomiting or diarrhea.  Taking antibiotics for UTI.  The history is provided by the patient and a relative.  Altered Mental Status      Home Medications Prior to Admission medications   Medication Sig Start Date End Date Taking? Authorizing Provider  acetaminophen (TYLENOL) 500 MG tablet Take 1,000 mg by mouth every 6 (six) hours as needed for moderate pain or headache.    [provider]  albuterol (VENTOLIN HFA) 108 (90 Base) MCG/ACT inhaler Inhale 2 puffs into the lungs every 6 (six) hours as needed for wheezing or shortness of breath. 09/02/21   Michela Pitcher, NP  allopurinol (ZYLOPRIM) 100 MG tablet TAKE 1/2 TABLET BY MOUTH EVERY OTHER DAY 05/20/22   Bedsole, Amy E, MD   cefdinir (OMNICEF) 300 MG capsule Take 1 capsule (300 mg total) by mouth 2 (two) times daily. 08/01/22   Carrie Mew, MD  Cranberry 500 MG CAPS Take by mouth.    [provider]  diclofenac Sodium (VOLTAREN) 1 % GEL Apply 2 g topically 4 (four) times daily as needed.    [provider]  donepezil (ARICEPT) 10 MG tablet TAKE 1 TABLET BY MOUTH EVERYDAY AT BEDTIME 04/08/22   Bedsole, Amy E, MD  doxycycline (VIBRA-TABS) 100 MG tablet Take 1 tablet (100 mg total) by mouth 2 (two) times daily. 07/25/22   Copland, Frederico Hamman, MD  escitalopram (LEXAPRO) 20 MG tablet TAKE 1 TABLET BY MOUTH EVERY DAY 04/08/22   Diona Browner, Amy E, MD  estradiol (ESTRACE VAGINAL) 0.1 MG/GM vaginal cream Apply 0.'5mg'$  (pea-sized amount)  just inside the vaginal introitus with a finger-tip on Monday, Wednesday and Friday nights. 02/16/21   Zara Council A, PA-C  hydrOXYzine (ATARAX/VISTARIL) 10 MG tablet TAKE 0.5-1 TABLETS (5-10 MG TOTAL) BY MOUTH DAILY AS NEEDED FOR ANXIETY. 02/10/21   Bedsole, Amy E, MD  loratadine (CLARITIN) 10 MG tablet Take by mouth.    [provider]  Multiple Vitamins-Minerals (MULTIVITAMIN WITH MINERALS) tablet Take 1 tablet by mouth daily.    [provider]  predniSONE (DELTASONE) 20 MG tablet 2 tabs po daily for 5 days, then 1 tab po  daily for 5 days 07/25/22   Copland, Frederico Hamman, MD  Probiotic Product (PROBIOTIC DAILY PO) Take by mouth.    [provider]  TART CHERRY PO Take 1 tablet by mouth in the morning and at bedtime.    [provider]  tiotropium (SPIRIVA HANDIHALER) 18 MCG inhalation capsule INHALE 1 CAPSULE VIA HANDIHALER ONCE DAILY AT THE SAME TIME EVERY DAY 09/16/20   Bedsole, Amy E, MD  Vitamin D, Ergocalciferol, (DRISDOL) 1.25 MG (50000 UNIT) CAPS capsule TAKE 1 CAPSULE (50,000 UNITS TOTAL) BY MOUTH EVERY FRIDAY. 04/08/22   Jinny Sanders, MD      Allergies    Lovastatin, Statins, Sulfa antibiotics, Codeine, and Niacin    Review of  Systems   Review of Systems  Unable to perform ROS: Mental status change    Physical Exam Updated Vital Signs BP (!) 141/82 (BP Location: Right Arm)   Pulse 91   Temp 97.7 F (36.5 C) (Oral)   Resp 18   SpO2 97%  Physical Exam Vitals and nursing note reviewed.  Constitutional:      General: She is not in acute distress.    Appearance: She is well-developed.     Comments: Chronically ill-appearing, oriented x 1  HENT:     Head: Normocephalic and atraumatic.     Mouth/Throat:     Mouth: Mucous membranes are dry.     Pharynx: No oropharyngeal exudate.  Eyes:     Conjunctiva/sclera: Conjunctivae normal.     Pupils: Pupils are equal, round, and reactive to light.  Neck:     Comments: No meningismus. Cardiovascular:     Rate and Rhythm: Normal rate and regular rhythm.     Heart sounds: Normal heart sounds. No murmur heard. Pulmonary:     Effort: Pulmonary effort is normal. No respiratory distress.     Breath sounds: Rhonchi present.  Abdominal:     Palpations: Abdomen is soft.     Tenderness: There is no abdominal tenderness. There is no guarding or rebound.  Musculoskeletal:        General: No tenderness. Normal range of motion.     Cervical back: Normal range of motion and neck supple.  Skin:    General: Skin is warm.     Capillary Refill: Capillary refill takes less than 2 seconds.  Neurological:     General: No focal deficit present.     Mental Status: She is alert. Mental status is at baseline.     Cranial Nerves: No cranial nerve deficit.     Motor: No abnormal muscle tone.     Coordination: Coordination normal.     Comments: Oriented to person only.  Cranial nerves II to XII intact, 5/5 strength throughout.  Psychiatric:        Behavior: Behavior normal.     ED Results / Procedures / Treatments   Labs (all labs ordered are listed, but only abnormal results are displayed) Labs Reviewed  COMPREHENSIVE METABOLIC PANEL - Abnormal; Notable for the following  components:      Result Value   Glucose, Bld 284 (*)    BUN 67 (*)    Creatinine, Ser 2.06 (*)    AST 11 (*)    GFR, Estimated 23 (*)    All other components within normal limits  CBC - Abnormal; Notable for the following components:   WBC 31.4 (*)    All other components within normal limits  CBG MONITORING, ED - Abnormal; Notable for the following components:  Glucose-Capillary 273 (*)    All other components within normal limits  RESP PANEL BY RT-PCR (RSV, FLU A&B, COVID)  RVPGX2  CULTURE, BLOOD (ROUTINE X 2)  CULTURE, BLOOD (ROUTINE X 2)  URINE CULTURE  URINALYSIS, ROUTINE W REFLEX MICROSCOPIC  LACTIC ACID, PLASMA  LACTIC ACID, PLASMA  AMMONIA  TSH  TROPONIN I (HIGH SENSITIVITY)  TROPONIN I (HIGH SENSITIVITY)    EKG None  Radiology CT Head Wo Contrast  Result Date: 08/05/2022 CLINICAL DATA:  Delirium EXAM: CT HEAD WITHOUT CONTRAST TECHNIQUE: Contiguous axial images were obtained from the base of the skull through the vertex without intravenous contrast. RADIATION DOSE REDUCTION: This exam was performed according to the departmental dose-optimization program which includes automated exposure control, adjustment of the mA and/or kV according to patient size and/or use of iterative reconstruction technique. COMPARISON:  02/26/2021 FINDINGS: Brain: No evidence of acute infarction, hemorrhage, hydrocephalus, extra-axial collection or mass lesion/mass effect. Global cortical atrophy.  Secondary tracheal ir prominence. Extensive subcortical white matter and periventricular small vessel ischemic changes. Vascular: Intracranial atherosclerosis. Skull: Normal. Negative for fracture or focal lesion. Sinuses/Orbits: The visualized paranasal sinuses are essentially clear. The mastoid air cells are unopacified. Other: None. IMPRESSION: No evidence of acute intracranial abnormality. Atrophy with small vessel ischemic changes. Electronically Signed   By: Julian Hy M.D.   On: 08/05/2022  20:59   DG Chest 2 View  Result Date: 08/05/2022 CLINICAL DATA:  Altered mental status, cough EXAM: CHEST - 2 VIEW COMPARISON:  08/01/2022 chest CT and radiograph FINDINGS: Mild central airway thickening. The lungs appear otherwise clear on conventional radiography. Heart size within normal limits. Mild tortuosity of the thoracic aorta. No significant bony abnormality observed. No blunting of the costophrenic angles. IMPRESSION: 1. Airway thickening is present, suggesting bronchitis or reactive airways disease. 2. Mild tortuosity of the thoracic aorta. Electronically Signed   By: Van Clines M.D.   On: 08/05/2022 18:03    Procedures Procedures    Medications Ordered in ED Medications  cefTRIAXone (ROCEPHIN) 1 g in sodium chloride 0.9 % 100 mL IVPB (has no administration in time range)  sodium chloride 0.9 % bolus 1,000 mL (has no administration in time range)    ED Course/ Medical Decision Making/ A&P                           Medical Decision Making Amount and/or Complexity of Data Reviewed Independent Historian: caregiver Labs: ordered. Decision-making details documented in ED Course. Radiology: ordered and independent interpretation performed. Decision-making details documented in ED Course. ECG/medicine tests: ordered and independent interpretation performed. Decision-making details documented in ED Course.  Risk Decision regarding hospitalization.  Recent diagnosis of UTI with increasing weakness, confusion and altered mental status over the past 2 days.  Vital stable on arrival, no distress.  Increased confusion and difficulty following commands and not oriented.  Labs show leukocytosis which appears to be a chronic issue for her.  There is some mention of CLL per PCPs notes.  Family not aware of this.  Urine culture is growing Klebsiella which is sensitive to cefdinir.  Labs do show some AKI and will patient will be hydrated.  Question whether she has delirium due to  recent steroid use.  Concern for possible delirium due to UTI.  She is on appropriate antibiotics.  Continue IV hydration and IV antibiotics while cultures are pending.  CT head is stable. Significant leukocytosis with questionable CLL per PCP notes.  Patient  is becoming weaker, more confused and is not improving, we will plan admission with consideration of failure of outpatient antibiotics.  Discussed with Dr. Ernesto Rutherford       Final Clinical Impression(s) / ED Diagnoses Final diagnoses:  Altered mental status, unspecified altered mental status type  Urinary tract infection without hematuria, site unspecified  AKI (acute kidney injury) [facility])    Rx / DC Orders ED Discharge Orders     None         Zissy Hamlett, Annie Main, MD 08/05/22 2138

## 2022-08-05 NOTE — Telephone Encounter (Signed)
Spoke with granddaughter [name] in detail about recent ER visit and visit with Dr. Damita Dunnings.  She just started the cefdinir yesterday.  I encouraged her to continue fluids and cefdinir but if her grandmother does not start improving or continues to decline to go back to the emergency room.  Of note we did discuss the pathology smear of white blood cells that showed possible changes consistent with CLL  As well as worsening creatinine suggesting dehydration.

## 2022-08-05 NOTE — Progress Notes (Signed)
  Care Coordination  Note  08/05/2022 Name: Jill [name] MRN: [id number] DOB: 04-Jul-1932  Jill [name] is a 88 y.o. year old primary care patient of Bedsole, Amy E, MD. I reached out to Jill [name] by phone today to assist with scheduling a follow up appointment. Jill [name] verbally consented to my assistance.       Follow up plan: Hospital Follow Up appointment scheduled with (Dr Diona Browner) on (08/10/2022) at (920am).  Julian Hy, [place] Direct Dial: [phone]

## 2022-08-05 NOTE — Patient Outreach (Signed)
  Care Coordination [facility] Note Transition Care Management Follow-up Telephone Call Date of discharge and from where: [facility] [phone] How have you been since you were released from the hospital? Still having some confusion from the UTI Any questions or concerns? No  Items Reviewed: Did the pt receive and understand the discharge instructions provided? Yes  Medications obtained and verified? Yes  Reiterated the importance of taking all the atbx Other? No  Any new allergies since your discharge? No  Dietary orders reviewed? No Do you have support at home? Yes   Home Care and Equipment/Supplies: Were home health services ordered? no If so, what is the name of the agency? N/a  Has the agency set up a time to come to the patient's home? no Were any new equipment or medical supplies ordered?  No What is the name of the medical supply agency? N/a Were you able to get the supplies/equipment? not applicable Do you have any questions related to the use of the equipment or supplies? No  Functional Questionnaire: (I = Independent and D = Dependent) ADLs: I  Bathing/Dressing- I  Meal Prep- I  Eating- I  Maintaining continence- I  Transferring/Ambulation- I  Managing Meds- I  Follow up appointments reviewed:  PCP Hospital f/u appt confirmed? No . [facility] f/u appt confirmed? No  . Are transportation arrangements needed? No  If their condition worsens, is the pt aware to call PCP or go to the Emergency Dept.? Yes Was the patient provided with contact information for the PCP's office or ED? Yes Was to pt encouraged to call back with questions or concerns? Yes  SDOH assessments and interventions completed:   Yes  SDOH Screenings   Food Insecurity: No Food Insecurity (08/05/2022)  Housing: Low Risk  (08/05/2022)  Transportation Needs: No Transportation Needs (08/05/2022)  Utilities: Not At Risk (06/22/2022)  Alcohol Screen: Low Risk  (06/22/2022)  Depression (PHQ2-9): Low  Risk  (06/22/2022)  Financial Resource Strain: Low Risk  (06/22/2022)  Physical Activity: Inactive (06/22/2022)  Social Connections: Moderately Integrated (06/22/2022)  Stress: No Stress Concern Present (06/22/2022)  Tobacco Use: Medium Risk (08/01/2022)     Care Coordination Interventions:  PCP follow up appointment requested   Encounter Outcome:  Pt. Visit Completed   [place] Management [phone]

## 2022-08-05 NOTE — ED Triage Notes (Addendum)
Pt presents to ED POV. Per daughter pt has had cough x1w that she has been taking antibiotics for but is worsening. Per family pt is also becoming confused x2d. Pt also on antibiotics right now for UTI. Pt typically a7o x4 but only oriented to self now.

## 2022-08-05 NOTE — Progress Notes (Signed)
This is a 88 year old female who lives at home.  Her past medical history includes gout, depression, dementia, COPD and CKD.  4 days ago she was seen at [facility] for AMS and generalized weakness.  She was diagnosed with Klebsiella UTI. She was discharged on Omnicef.  Despite being on antibiotics, her altered mentation has worsened, she is now having visual hallucinations and talking even more out of her head.  Her family took her to the ER at [place].  In the ER she was alert and oriented x 1.  At baseline patient is alert and oriented x 3.  Her UA is suggestive of residual infection.  Admission was requested for failed outpatient treatment for UTI.  Of note the patient was recently on steroids for URI, ? steroid delirium. Patient with chronic leukocytosis, her PCP's note mentioned possible CLL

## 2022-08-05 NOTE — ED Notes (Signed)
Date and time results received: 08/05/22 2255 (use smartphrase ".now" to insert current time)  Test: lactic acid Critical Value: 2.0  Name of Provider Notified: Vevelyn Francois, MD  Orders Received? Or Actions Taken?:  n/a

## 2022-08-06 ENCOUNTER — Encounter (HOSPITAL_COMMUNITY): Payer: Self-pay | Admitting: Family Medicine

## 2022-08-06 DIAGNOSIS — Z20822 Contact with and (suspected) exposure to covid-19: Secondary | ICD-10-CM | POA: Diagnosis present

## 2022-08-06 DIAGNOSIS — E785 Hyperlipidemia, unspecified: Secondary | ICD-10-CM | POA: Diagnosis present

## 2022-08-06 DIAGNOSIS — N63 Unspecified lump in unspecified breast: Secondary | ICD-10-CM | POA: Diagnosis present

## 2022-08-06 DIAGNOSIS — J449 Chronic obstructive pulmonary disease, unspecified: Secondary | ICD-10-CM | POA: Diagnosis present

## 2022-08-06 DIAGNOSIS — N179 Acute kidney failure, unspecified: Secondary | ICD-10-CM | POA: Diagnosis present

## 2022-08-06 DIAGNOSIS — E1169 Type 2 diabetes mellitus with other specified complication: Secondary | ICD-10-CM | POA: Diagnosis present

## 2022-08-06 DIAGNOSIS — B961 Klebsiella pneumoniae [K. pneumoniae] as the cause of diseases classified elsewhere: Secondary | ICD-10-CM | POA: Diagnosis present

## 2022-08-06 DIAGNOSIS — Z1611 Resistance to penicillins: Secondary | ICD-10-CM | POA: Diagnosis present

## 2022-08-06 DIAGNOSIS — N184 Chronic kidney disease, stage 4 (severe): Secondary | ICD-10-CM | POA: Diagnosis present

## 2022-08-06 DIAGNOSIS — J181 Lobar pneumonia, unspecified organism: Secondary | ICD-10-CM | POA: Diagnosis present

## 2022-08-06 DIAGNOSIS — I152 Hypertension secondary to endocrine disorders: Secondary | ICD-10-CM | POA: Diagnosis present

## 2022-08-06 DIAGNOSIS — G9341 Metabolic encephalopathy: Secondary | ICD-10-CM | POA: Diagnosis present

## 2022-08-06 DIAGNOSIS — E1140 Type 2 diabetes mellitus with diabetic neuropathy, unspecified: Secondary | ICD-10-CM | POA: Diagnosis present

## 2022-08-06 DIAGNOSIS — F339 Major depressive disorder, recurrent, unspecified: Secondary | ICD-10-CM | POA: Diagnosis present

## 2022-08-06 DIAGNOSIS — F03B3 Unspecified dementia, moderate, with mood disturbance: Secondary | ICD-10-CM | POA: Diagnosis present

## 2022-08-06 DIAGNOSIS — N6310 Unspecified lump in the right breast, unspecified quadrant: Secondary | ICD-10-CM | POA: Diagnosis present

## 2022-08-06 DIAGNOSIS — F03B2 Unspecified dementia, moderate, with psychotic disturbance: Secondary | ICD-10-CM | POA: Diagnosis present

## 2022-08-06 DIAGNOSIS — Z66 Do not resuscitate: Secondary | ICD-10-CM | POA: Diagnosis present

## 2022-08-06 DIAGNOSIS — D72820 Lymphocytosis (symptomatic): Secondary | ICD-10-CM | POA: Diagnosis present

## 2022-08-06 DIAGNOSIS — E1122 Type 2 diabetes mellitus with diabetic chronic kidney disease: Secondary | ICD-10-CM | POA: Diagnosis present

## 2022-08-06 DIAGNOSIS — D72829 Elevated white blood cell count, unspecified: Secondary | ICD-10-CM | POA: Diagnosis present

## 2022-08-06 DIAGNOSIS — I2510 Atherosclerotic heart disease of native coronary artery without angina pectoris: Secondary | ICD-10-CM | POA: Diagnosis present

## 2022-08-06 DIAGNOSIS — Z96643 Presence of artificial hip joint, bilateral: Secondary | ICD-10-CM | POA: Diagnosis present

## 2022-08-06 DIAGNOSIS — R4182 Altered mental status, unspecified: Secondary | ICD-10-CM | POA: Diagnosis not present

## 2022-08-06 DIAGNOSIS — N390 Urinary tract infection, site not specified: Secondary | ICD-10-CM | POA: Diagnosis present

## 2022-08-06 DIAGNOSIS — J440 Chronic obstructive pulmonary disease with acute lower respiratory infection: Secondary | ICD-10-CM | POA: Diagnosis present

## 2022-08-06 DIAGNOSIS — R7881 Bacteremia: Secondary | ICD-10-CM | POA: Diagnosis present

## 2022-08-06 LAB — COMPREHENSIVE METABOLIC PANEL
ALT: 13 U/L (ref 0–44)
AST: 14 U/L — ABNORMAL LOW (ref 15–41)
Albumin: 2.9 g/dL — ABNORMAL LOW (ref 3.5–5.0)
Alkaline Phosphatase: 74 U/L (ref 38–126)
Anion gap: 10 (ref 5–15)
BUN: 60 mg/dL — ABNORMAL HIGH (ref 8–23)
CO2: 22 mmol/L (ref 22–32)
Calcium: 8.7 mg/dL — ABNORMAL LOW (ref 8.9–10.3)
Chloride: 108 mmol/L (ref 98–111)
Creatinine, Ser: 2.07 mg/dL — ABNORMAL HIGH (ref 0.44–1.00)
GFR, Estimated: 23 mL/min — ABNORMAL LOW (ref >60)
Glucose, Bld: 164 mg/dL — ABNORMAL HIGH (ref 70–99)
Potassium: 4.5 mmol/L (ref 3.5–5.1)
Sodium: 140 mmol/L (ref 135–145)
Total Bilirubin: 0.5 mg/dL (ref 0.3–1.2)
Total Protein: 5.4 g/dL — ABNORMAL LOW (ref 6.5–8.1)

## 2022-08-06 LAB — CBC WITH DIFFERENTIAL/PLATELET
Abs Immature Granulocytes: 0 10*3/uL (ref 0.00–0.07)
Basophils Absolute: 0 10*3/uL (ref 0.0–0.1)
Basophils Relative: 0 %
Eosinophils Absolute: 0 10*3/uL (ref 0.0–0.5)
Eosinophils Relative: 0 %
HCT: 37.3 % (ref 36.0–46.0)
Hemoglobin: 11.9 g/dL — ABNORMAL LOW (ref 12.0–15.0)
Lymphocytes Relative: 54 %
Lymphs Abs: 12.5 10*3/uL — ABNORMAL HIGH (ref 0.7–4.0)
MCH: 29.8 pg (ref 26.0–34.0)
MCHC: 31.9 g/dL (ref 30.0–36.0)
MCV: 93.3 fL (ref 80.0–100.0)
Monocytes Absolute: 1.9 10*3/uL — ABNORMAL HIGH (ref 0.1–1.0)
Monocytes Relative: 8 %
Neutro Abs: 8.8 10*3/uL — ABNORMAL HIGH (ref 1.7–7.7)
Neutrophils Relative %: 38 %
Platelets: 188 10*3/uL (ref 150–400)
RBC: 4 MIL/uL (ref 3.87–5.11)
RDW: 13.6 % (ref 11.5–15.5)
WBC: 23.2 10*3/uL — ABNORMAL HIGH (ref 4.0–10.5)
nRBC: 0 % (ref 0.0–0.2)
nRBC: 0 /100 WBC

## 2022-08-06 LAB — MAGNESIUM: Magnesium: 2 mg/dL (ref 1.7–2.4)

## 2022-08-06 LAB — GLUCOSE, CAPILLARY: Glucose-Capillary: 190 mg/dL — ABNORMAL HIGH (ref 70–99)

## 2022-08-06 MED ORDER — BISACODYL 5 MG PO TBEC: 5.0000 mg | DELAYED_RELEASE_TABLET | Freq: Every day | ORAL | Status: DC | PRN

## 2022-08-06 MED ORDER — INSULIN ASPART 100 UNIT/ML IJ SOLN
0.0000 [IU] | Freq: Three times a day (TID) | INTRAMUSCULAR | Status: DC
  Administered 2022-08-06 – 2022-08-07 (×2): 2 [IU] via SUBCUTANEOUS
  Administered 2022-08-07: 1 [IU] via SUBCUTANEOUS
  Administered 2022-08-07: 2 [IU] via SUBCUTANEOUS
  Administered 2022-08-08: 1 [IU] via SUBCUTANEOUS
  Administered 2022-08-08: 5 [IU] via SUBCUTANEOUS
  Administered 2022-08-09: 2 [IU] via SUBCUTANEOUS

## 2022-08-06 MED ORDER — UMECLIDINIUM BROMIDE 62.5 MCG/ACT IN AEPB
1.0000 | INHALATION_SPRAY | Freq: Every day | RESPIRATORY_TRACT | Status: DC
  Administered 2022-08-06 – 2022-08-09 (×4): 1 via RESPIRATORY_TRACT
  Filled 2022-08-06: qty 7

## 2022-08-06 MED ORDER — ALBUTEROL SULFATE (2.5 MG/3ML) 0.083% IN NEBU: 2.5000 mg | INHALATION_SOLUTION | Freq: Four times a day (QID) | RESPIRATORY_TRACT | Status: DC | PRN

## 2022-08-06 MED ORDER — ACETAMINOPHEN 325 MG PO TABS: 650.0000 mg | ORAL_TABLET | Freq: Four times a day (QID) | ORAL | Status: DC | PRN

## 2022-08-06 MED ORDER — ACETAMINOPHEN 650 MG RE SUPP: 650.0000 mg | Freq: Four times a day (QID) | RECTAL | Status: DC | PRN

## 2022-08-06 MED ORDER — CEFDINIR 300 MG PO CAPS
300.0000 mg | ORAL_CAPSULE | Freq: Two times a day (BID) | ORAL | Status: DC
  Administered 2022-08-06 – 2022-08-09 (×7): 300 mg via ORAL
  Filled 2022-08-06 (×7): qty 1

## 2022-08-06 MED ORDER — DOCUSATE SODIUM 100 MG PO CAPS
100.0000 mg | ORAL_CAPSULE | Freq: Two times a day (BID) | ORAL | Status: DC
  Administered 2022-08-06 – 2022-08-09 (×7): 100 mg via ORAL
  Filled 2022-08-06 (×7): qty 1

## 2022-08-06 MED ORDER — HYDRALAZINE HCL 20 MG/ML IJ SOLN: 5.0000 mg | INTRAMUSCULAR | Status: DC | PRN

## 2022-08-06 MED ORDER — CAMPHOR-MENTHOL 0.5-0.5 % EX LOTN: 1.0000 | TOPICAL_LOTION | Freq: Three times a day (TID) | CUTANEOUS | Status: DC | PRN

## 2022-08-06 MED ORDER — POLYETHYLENE GLYCOL 3350 17 G PO PACK: 17.0000 g | PACK | Freq: Every day | ORAL | Status: DC | PRN

## 2022-08-06 MED ORDER — NEPRO/CARBSTEADY PO LIQD: 237.0000 mL | Freq: Three times a day (TID) | ORAL | Status: DC | PRN

## 2022-08-06 MED ORDER — LACTATED RINGERS IV SOLN: INTRAVENOUS | Status: DC

## 2022-08-06 MED ORDER — ADULT MULTIVITAMIN W/MINERALS CH
1.0000 | ORAL_TABLET | Freq: Every day | ORAL | Status: DC
  Administered 2022-08-06 – 2022-08-09 (×4): 1 via ORAL
  Filled 2022-08-06 (×4): qty 1

## 2022-08-06 MED ORDER — TIOTROPIUM BROMIDE MONOHYDRATE 18 MCG IN CAPS: 18.0000 ug | ORAL_CAPSULE | Freq: Every day | RESPIRATORY_TRACT | Status: DC

## 2022-08-06 MED ORDER — HYDROXYZINE HCL 10 MG PO TABS: 5.0000 mg | ORAL_TABLET | Freq: Every day | ORAL | Status: DC | PRN

## 2022-08-06 MED ORDER — GLUCERNA SHAKE PO LIQD
237.0000 mL | Freq: Three times a day (TID) | ORAL | Status: DC
  Administered 2022-08-06 – 2022-08-09 (×9): 237 mL via ORAL

## 2022-08-06 MED ORDER — ENOXAPARIN SODIUM 30 MG/0.3ML IJ SOSY
30.0000 mg | PREFILLED_SYRINGE | INTRAMUSCULAR | Status: DC
  Administered 2022-08-06 – 2022-08-08 (×3): 30 mg via SUBCUTANEOUS
  Filled 2022-08-06 (×4): qty 0.3

## 2022-08-06 MED ORDER — ALBUTEROL SULFATE HFA 108 (90 BASE) MCG/ACT IN AERS: 2.0000 | INHALATION_SPRAY | Freq: Four times a day (QID) | RESPIRATORY_TRACT | Status: DC | PRN

## 2022-08-06 MED ORDER — HYDROXYZINE HCL 25 MG PO TABS: 25.0000 mg | ORAL_TABLET | Freq: Three times a day (TID) | ORAL | Status: DC | PRN

## 2022-08-06 MED ORDER — LORATADINE 10 MG PO TABS
10.0000 mg | ORAL_TABLET | Freq: Every day | ORAL | Status: DC
  Administered 2022-08-06 – 2022-08-09 (×4): 10 mg via ORAL
  Filled 2022-08-06 (×4): qty 1

## 2022-08-06 MED ORDER — CALCIUM CARBONATE ANTACID 1250 MG/5ML PO SUSP: 500.0000 mg | Freq: Four times a day (QID) | ORAL | Status: DC | PRN

## 2022-08-06 NOTE — Progress Notes (Signed)
Initial Nutrition Assessment  DOCUMENTATION CODES:   Obesity unspecified  INTERVENTION:   -MVI with minerals daily -Glucerna Shake po TID, each supplement provides 220 kcal and 10 grams of protein   NUTRITION DIAGNOSIS:   Predicted suboptimal nutrient intake related to lethargy/confusion as evidenced by per patient/family report.  GOAL:   Patient will meet greater than or equal to 90% of their needs  MONITOR:   PO intake, Supplement acceptance  REASON FOR ASSESSMENT:   Consult Assessment of nutrition requirement/status  ASSESSMENT:   Pt with medical history significant of DM, HTN, dementia, COPD, stage 4 CKD, and HLD presenting with AMS  Pt admitted with AMS, acute metabolic encephalopathy, and recent UTI.   Reviewed I/O's: +1 L x 24 hours   Pt unavailable at time of visit. Attempted to speak with pt via call to hospital room phone, however, unable to reach. RD unable to obtain further nutrition-related history or complete nutrition-focused physical exam at this time.    Per H&P, pt with poor oral intake PTA. Pt was seen in ED earlier this week and was diagnosed with UTI, which failed inpatient treatment.   Pt currently on a carb modified diet. No meal completion data available to assess at this time.   Reviewed wt hx; wt has been stable over the past 6 months.  Medications reviewed and include colace and lactated ringers infusion @ 75 ml/hr.   Lab Results  Component Value Date   HGBA1C 7.1 (A) 02/15/2022   PTA DM medications are none. Per ADA's Standards of Medical Care for Diabetes, glycemic targets for elderly patients who are otherwise healthy (few medical impairments) and cognitively intact should be less stringent (Hgb A1c <7.5).    Labs reviewed: CBGS: 273 (inpatient orders for glycemic control are 0-9 units insulin aspart TID with meals).    Diet Order:   Diet Order             Diet Carb Modified Fluid consistency: Thin; Room service appropriate? Yes   Diet effective ____                   EDUCATION NEEDS:   No education needs have been identified at this time  Skin:  Skin Assessment: Reviewed RN Assessment  Last BM:  08/05/22  Height:   Ht Readings from Last 1 Encounters:  08/06/22 '5\' 2"'$  (1.575 m)    Weight:   Wt Readings from Last 1 Encounters:  08/06/22 76.2 kg    Ideal Body Weight:  50 kg  BMI:  Body mass index is 30.73 kg/m.  Estimated Nutritional Needs:   Kcal:  1500-1700  Protein:  75-90 grams  Fluid:  > 1.5 L    Loistine Chance, RD, LDN, [place] Registered Dietitian II Certified Diabetes Care and Education Specialist Please refer to [facility] for RD and/or RD on-call/weekend/after hours pager

## 2022-08-06 NOTE — H&P (Signed)
History and Physical    Patient: Jill [name] ONG:[id number] DOB: 10-19-1931 DOA: 08/05/2022 DOS: the patient was seen and examined on 08/06/2022 PCP: Jinny Sanders, MD  Patient coming from: Home - lives with granddaughter; NOK: Daughter, [phone]   Chief Complaint: AMS  HPI: Jill [name] is a 88 y.o. female with medical history significant of DM, HTN, dementia, COPD, stage 4 CKD, and HLD presenting with AMS.  Her daughter reports that she had bronchitis last week and it got worse.  Her granddaughter took her to the ER Monday at [facility].  She was diagnosed with PNA and a UTI.  They thought it got a little better but she was increasingly confused.  She was having hallucinations and so they brought her back in.  She is not coughing as much but they think the UTI is the real problem.  Her WBC count was high.  She does not eat well.    Record from [facility] ER visit on 12/11 reviewed.  She had been treated with prednisone and doxy for bronchitis.  CXR was negative and CT chest showed resolving ground glass and tree-in-bud nodules, likely infectious/inflammatory.  Breast mass was also noted, new compared to 05/31/18 and slightly larger than 09/02/21.   UA was considered abnormal; she was given Rocephin -> Cefdinir.  Urine culture subsequently returned positive, sensitive to this therapy.  Renal CT with indeterminate R renal lesions, recommended for outpatient renal MRI when patient is able to follow directions and hold her breath.    ER Course:  Carryover, per Dr. Claria Dice:   4 days ago she was seen at [facility] for AMS and generalized weakness.  She was diagnosed with Klebsiella UTI. She was discharged on Omnicef.  Despite being on antibiotics, her altered mentation has worsened, she is now having visual hallucinations and talking even more out of her head.  Her family took her to the ER at [facility].  In the ER she was alert and oriented x 1.  At baseline patient is alert and oriented x 3.   Her UA is suggestive of residual infection.  Admission was requested for failed outpatient treatment for UTI.   Of note the patient was recently on steroids for URI, ? steroid delirium. Patient with chronic leukocytosis, her PCP's note mentioned possible CLL     Review of Systems: Unable to perform due to patient somnolence  Past Medical History:  Diagnosis Date   Allergy    Arthritis    osteoarthritis    Chronic kidney disease    Complication of anesthesia    difficult waking    Depression    Diabetes mellitus    Diverticula, colon    Family history of anesthesia complication    Son is difficult intubation   Hyperlipidemia    Hypertension    Vasovagal syncope 2006   Negative cardiac workup-myoview, echo   Past Surgical History:  Procedure Laterality Date   ABDOMINAL HYSTERECTOMY  1977   fibroid   JOINT REPLACEMENT  2009   rt hip   TOTAL HIP ARTHROPLASTY Left 05/12/2014   dr Mayer Camel   TOTAL HIP ARTHROPLASTY Left 05/12/2014   Procedure: TOTAL HIP ARTHROPLASTY;  Surgeon: Kerin Salen, MD;  Location: [place];  Service: Orthopedics;  Laterality: Left;   Social History:  reports that she quit smoking about 23 years ago. Her smoking use included cigarettes. She has a 45.00 pack-year smoking history. She has never used smokeless tobacco. She reports that she does not drink  alcohol and does not use drugs.  Allergies  Allergen Reactions   Lovastatin Other (See Comments)    REACTION: leg pain   Rocephin [Ceftriaxone] Hives   Statins Other (See Comments)    REACTION: leg cramps, weakness   Sulfa Antibiotics Hives and Itching   Codeine Rash   Niacin Rash    Flushing also    Family History  Problem Relation Age of Onset   COPD Brother    Heart disease Brother    Diabetes Brother    Lymphoma Brother    Alzheimer's disease Brother    Pancreatic cancer Sister    Alzheimer's disease Sister    Heart disease Mother    Breast cancer Sister    Leukemia Other    Kidney disease  Neg Hx     Prior to Admission medications   Medication Sig Start Date End Date Taking? Authorizing Provider  acetaminophen (TYLENOL) 500 MG tablet Take 1,000 mg by mouth every 6 (six) hours as needed for moderate pain or headache.    [provider]  albuterol (VENTOLIN HFA) 108 (90 Base) MCG/ACT inhaler Inhale 2 puffs into the lungs every 6 (six) hours as needed for wheezing or shortness of breath. 09/02/21   Michela Pitcher, NP  allopurinol (ZYLOPRIM) 100 MG tablet TAKE 1/2 TABLET BY MOUTH EVERY OTHER DAY 05/20/22   Bedsole, Amy E, MD  cefdinir (OMNICEF) 300 MG capsule Take 1 capsule (300 mg total) by mouth 2 (two) times daily. 08/01/22   Carrie Mew, MD  Cranberry 500 MG CAPS Take by mouth.    [provider]  diclofenac Sodium (VOLTAREN) 1 % GEL Apply 2 g topically 4 (four) times daily as needed.    [provider]  donepezil (ARICEPT) 10 MG tablet TAKE 1 TABLET BY MOUTH EVERYDAY AT BEDTIME 04/08/22   Bedsole, Amy E, MD  doxycycline (VIBRA-TABS) 100 MG tablet Take 1 tablet (100 mg total) by mouth 2 (two) times daily. 07/25/22   Copland, Frederico Hamman, MD  escitalopram (LEXAPRO) 20 MG tablet TAKE 1 TABLET BY MOUTH EVERY DAY 04/08/22   Diona Browner, Amy E, MD  estradiol (ESTRACE VAGINAL) 0.1 MG/GM vaginal cream Apply 0.'5mg'$  (pea-sized amount)  just inside the vaginal introitus with a finger-tip on Monday, Wednesday and Friday nights. 02/16/21   Zara Council A, PA-C  hydrOXYzine (ATARAX/VISTARIL) 10 MG tablet TAKE 0.5-1 TABLETS (5-10 MG TOTAL) BY MOUTH DAILY AS NEEDED FOR ANXIETY. 02/10/21   Bedsole, Amy E, MD  loratadine (CLARITIN) 10 MG tablet Take by mouth.    [provider]  Multiple Vitamins-Minerals (MULTIVITAMIN WITH MINERALS) tablet Take 1 tablet by mouth daily.    [provider]  predniSONE (DELTASONE) 20 MG tablet 2 tabs po daily for 5 days, then 1 tab po daily for 5 days 07/25/22   Copland, Frederico Hamman, MD  Probiotic Product (PROBIOTIC DAILY PO) Take by  mouth.    [provider]  TART CHERRY PO Take 1 tablet by mouth in the morning and at bedtime.    [provider]  tiotropium (SPIRIVA HANDIHALER) 18 MCG inhalation capsule INHALE 1 CAPSULE VIA HANDIHALER ONCE DAILY AT THE SAME TIME EVERY DAY 09/16/20   Bedsole, Amy E, MD  Vitamin D, Ergocalciferol, (DRISDOL) 1.25 MG (50000 UNIT) CAPS capsule TAKE 1 CAPSULE (50,000 UNITS TOTAL) BY MOUTH EVERY FRIDAY. 04/08/22   Jinny Sanders, MD    Physical Exam: Vitals:   08/06/22 0230 08/06/22 0342 08/06/22 0444 08/06/22 0953  BP: (!) 182/75 (!) 169/68 Marland Kitchen)  169/98 (!) 121/54  Pulse: 65 60 60 77  Resp: '18 18 18 16  '$ Temp:  97.8 F (36.6 C) 97.8 F (36.6 C) (!) 97.4 F (36.3 C)  TempSrc:  Oral Oral Axillary  SpO2: 100% 99%  96%  Weight:   76.2 kg   Height:   '5\' 2"'$  (1.575 m)    General:  Appears calm and comfortable and is in NAD Eyes:  normal lids, iris, mostly closed ENT:  grossly normal hearing, lips & tongue, mmm Neck:  no LAD, masses or thyromegaly Cardiovascular:  RRR, no m/r/g. No LE edema.  Respiratory:   CTA bilaterally with no wheezes/rales/rhonchi.  Normal respiratory effort. Abdomen:  soft, NT, ND Skin:  no rash or induration seen on limited exam Musculoskeletal:  no bony abnormality Psychiatric:  somnolent mood and affect, speech sparse, AOx1 Neurologic:  unable to effectively perform   Radiological Exams on Admission: Independently reviewed - see discussion in A/P where applicable  CT Head Wo Contrast  Result Date: 08/05/2022 CLINICAL DATA:  Delirium EXAM: CT HEAD WITHOUT CONTRAST TECHNIQUE: Contiguous axial images were obtained from the base of the skull through the vertex without intravenous contrast. RADIATION DOSE REDUCTION: This exam was performed according to the departmental dose-optimization program which includes automated exposure control, adjustment of the mA and/or kV according to patient size and/or use of iterative reconstruction technique.  COMPARISON:  02/26/2021 FINDINGS: Brain: No evidence of acute infarction, hemorrhage, hydrocephalus, extra-axial collection or mass lesion/mass effect. Global cortical atrophy.  Secondary tracheal ir prominence. Extensive subcortical white matter and periventricular small vessel ischemic changes. Vascular: Intracranial atherosclerosis. Skull: Normal. Negative for fracture or focal lesion. Sinuses/Orbits: The visualized paranasal sinuses are essentially clear. The mastoid air cells are unopacified. Other: None. IMPRESSION: No evidence of acute intracranial abnormality. Atrophy with small vessel ischemic changes. Electronically Signed   By: Julian Hy M.D.   On: 08/05/2022 20:59   DG Chest 2 View  Result Date: 08/05/2022 CLINICAL DATA:  Altered mental status, cough EXAM: CHEST - 2 VIEW COMPARISON:  08/01/2022 chest CT and radiograph FINDINGS: Mild central airway thickening. The lungs appear otherwise clear on conventional radiography. Heart size within normal limits. Mild tortuosity of the thoracic aorta. No significant bony abnormality observed. No blunting of the costophrenic angles. IMPRESSION: 1. Airway thickening is present, suggesting bronchitis or reactive airways disease. 2. Mild tortuosity of the thoracic aorta. Electronically Signed   By: Van Clines M.D.   On: 08/05/2022 18:03    EKG: Independently reviewed.   1850 - NSR with rate 81; nonspecific ST changes with no evidence of acute ischemia 2151 - NSR with rate 69; nonspecific ST changes with no evidence of acute ischemia   Labs on Admission: I have personally reviewed the available labs and imaging studies at the time of the admission.  Pertinent labs:    Glucose 164 BUN 60/Creatinine 2.07/GFR 23 - stable Albumin 2.9 HS troponin 17, 11 Lactate 2.0 WBC 31.4 -> 23.2 Hgb 11.9 TSH 3.449 UA: 30 protein Blood and urine cultures pending COVID/flu/RSV negative   Assessment and Plan: Principal Problem:   Acute metabolic  encephalopathy Active Problems:   Type 2 diabetes mellitus with diabetic neuropathy, unspecified ([place])   Hyperlipidemia associated with type 2 diabetes mellitus (HCC)   Major depression, recurrent (HCC)   Hypertension associated with diabetes ([place])   CKD (chronic kidney disease) stage 4, GFR 15-29 ml/min (HCC)   DNR (do not resuscitate)   Bronchitis   Stage 2 moderate COPD by GOLD  classification ([place])   Mild dementia ([place])   UTI (urinary tract infection)   Leukocytosis   Breast mass in female    Acute metabolic encephalopathy -Patient presenting with encephalopathy as evidenced by her increased confusion and hallucinations -While the patient does have underlying dementia, this is a change compared to her usual baseline mental status -Evaluation thus far unremarkable other than leukocytosis -She was recently treated for bronchitis and reaction to prednisone is a consideration -She was recently treated for Klebsiella UTI that was resistant only to Ampicillin -She does have persistent leukocytosis and mildly elevated lactate but not other SIRS criteria for sepsis -Will admit with IVF hydration  -PT/OT/ST and nutrition consults  Bronchitis -Recently treated with prednisone and doxycycline -Appears to be resolving clinically as well as radiographically -No further treatment is needed at this time  UTI -Recent Klebsiella UTI, appropriate treatment provided -UA appears improved -Would continue Cefdinir without further treatment -Renal CT showed indeterminate renal lesions; she is recommended to have a f/u renal MRI when her mental status will allow  Leukocytosis -Persistent leukocytosis since 08/2021 -Consider CLL although current infection may be contributing -Will follow  Breast mass -Appears to be new and enlarging -Suggest outpatient f/u  Dementia/depression -Continue Aricept, hydroxyzine, Lexapro -Will order delirium precautions  Stage 4 CKD -Appears to be stable at  this time -Attempt to avoid nephrotoxic medications -Recheck BMP in AM  -Continue allopurinol  DM -A1c was 7.1 on 6/27 -At her age, her A1c is likely <8 -She is not taking medications -Will cover with sensitive-scale SSI  HTN -She does not appear to be taking medications for this issue at this time -Will add prn IV hydralazine   HLD -She does not appear to be taking medications for this issue at this time   COPD -Continue Spiriva, albuterol  DNR -I have discussed code status with the patient's daughter and  they are in agreement that the patient would not desire resuscitation and would prefer to die a natural death should that situation arise. -She will need a gold out of facility DNR form at the time of discharge     Advance Care Planning:   Code Status: DNR   Consults: PT/OT/ST; nutrition; TOC team  DVT Prophylaxis: Lovenox  Family Communication: Daughter was present throughout evaluation  Severity of Illness: The appropriate patient status for this patient is INPATIENT. Inpatient status is judged to be reasonable and necessary in order to provide the required intensity of service to ensure the patient's safety. The patient's presenting symptoms, physical exam findings, and initial radiographic and laboratory data in the context of their chronic comorbidities is felt to place them at high risk for further clinical deterioration. Furthermore, it is not anticipated that the patient will be medically stable for discharge from the hospital within 2 midnights of admission.   * I certify that at the point of admission it is my clinical judgment that the patient will require inpatient hospital care spanning beyond 2 midnights from the point of admission due to high intensity of service, high risk for further deterioration and high frequency of surveillance required.*  Author: Karmen Bongo, MD 08/06/2022 10:29 AM  For on call review [email or web address].

## 2022-08-06 NOTE — Progress Notes (Signed)
(  Carryover admission to the Day Admitter; accepted by Dr.  Claria Dice as transfer from  [facility]  suspected acute metabolic encephalopathy; Please see Dr. Torrie Mayers transfer progress note for additional details).   I have placed some additional preliminary admit orders via the adult multi-morbid admission order set. I have also ordered delirium precautions as well as morning labs in the form of CMP, CBC, serum magnesium level.  I will defer additional decision-making regarding additional antibiotics to the admitting hospitalist.    Babs Bertin, DO Hospitalist

## 2022-08-07 DIAGNOSIS — G9341 Metabolic encephalopathy: Secondary | ICD-10-CM | POA: Diagnosis not present

## 2022-08-07 LAB — BASIC METABOLIC PANEL
Anion gap: 8 (ref 5–15)
BUN: 59 mg/dL — ABNORMAL HIGH (ref 8–23)
CO2: 22 mmol/L (ref 22–32)
Calcium: 9 mg/dL (ref 8.9–10.3)
Chloride: 106 mmol/L (ref 98–111)
Creatinine, Ser: 2.03 mg/dL — ABNORMAL HIGH (ref 0.44–1.00)
GFR, Estimated: 23 mL/min — ABNORMAL LOW (ref >60)
Glucose, Bld: 191 mg/dL — ABNORMAL HIGH (ref 70–99)
Potassium: 5.1 mmol/L (ref 3.5–5.1)
Sodium: 136 mmol/L (ref 135–145)

## 2022-08-07 LAB — CBC
HCT: 36.2 % (ref 36.0–46.0)
Hemoglobin: 11.9 g/dL — ABNORMAL LOW (ref 12.0–15.0)
MCH: 30.5 pg (ref 26.0–34.0)
MCHC: 32.9 g/dL (ref 30.0–36.0)
MCV: 92.8 fL (ref 80.0–100.0)
Platelets: 173 10*3/uL (ref 150–400)
RBC: 3.9 MIL/uL (ref 3.87–5.11)
RDW: 13.6 % (ref 11.5–15.5)
WBC: 21.6 10*3/uL — ABNORMAL HIGH (ref 4.0–10.5)
nRBC: 0 % (ref 0.0–0.2)

## 2022-08-07 LAB — BLOOD CULTURE ID PANEL (REFLEXED) - BCID2

## 2022-08-07 LAB — URINE CULTURE: Culture: NO GROWTH

## 2022-08-07 LAB — GLUCOSE, CAPILLARY
Glucose-Capillary: 147 mg/dL — ABNORMAL HIGH (ref 70–99)
Glucose-Capillary: 175 mg/dL — ABNORMAL HIGH (ref 70–99)
Glucose-Capillary: 192 mg/dL — ABNORMAL HIGH (ref 70–99)
Glucose-Capillary: 253 mg/dL — ABNORMAL HIGH (ref 70–99)

## 2022-08-07 NOTE — Progress Notes (Signed)
PHARMACY - PHYSICIAN COMMUNICATION CRITICAL VALUE ALERT - BLOOD CULTURE IDENTIFICATION (BCID)  Jill [name] is an 88 y.o. female who presented to [facility] on 08/05/2022 with a chief complaint of AMS   Name of physician (or Provider) Contacted: [facility]  Current antibiotics: PO cephalosporin  Changes to prescribed antibiotics recommended:  Patient is on recommended antibiotics - No changes needed  Results for orders placed or performed during the hospital encounter of 08/05/22  Blood Culture ID Panel (Reflexed) (Collected: 08/05/2022 10:20 PM)  Result Value Ref Range   Enterococcus faecalis NOT DETECTED NOT DETECTED   Enterococcus Faecium NOT DETECTED NOT DETECTED   Listeria monocytogenes NOT DETECTED NOT DETECTED   Staphylococcus species DETECTED (A) NOT DETECTED   Staphylococcus aureus (BCID) NOT DETECTED NOT DETECTED   Staphylococcus epidermidis NOT DETECTED NOT DETECTED   Staphylococcus lugdunensis NOT DETECTED NOT DETECTED   Streptococcus species NOT DETECTED NOT DETECTED   Streptococcus agalactiae NOT DETECTED NOT DETECTED   Streptococcus pneumoniae NOT DETECTED NOT DETECTED   Streptococcus pyogenes NOT DETECTED NOT DETECTED   A.calcoaceticus-baumannii NOT DETECTED NOT DETECTED   Bacteroides fragilis NOT DETECTED NOT DETECTED   Enterobacterales NOT DETECTED NOT DETECTED   Enterobacter cloacae complex NOT DETECTED NOT DETECTED   Escherichia coli NOT DETECTED NOT DETECTED   Klebsiella aerogenes NOT DETECTED NOT DETECTED   Klebsiella oxytoca NOT DETECTED NOT DETECTED   Klebsiella pneumoniae NOT DETECTED NOT DETECTED   Proteus species NOT DETECTED NOT DETECTED   Salmonella species NOT DETECTED NOT DETECTED   Serratia marcescens NOT DETECTED NOT DETECTED   Haemophilus influenzae NOT DETECTED NOT DETECTED   Neisseria meningitidis NOT DETECTED NOT DETECTED   Pseudomonas aeruginosa NOT DETECTED NOT DETECTED   Stenotrophomonas maltophilia NOT DETECTED NOT DETECTED   Candida  albicans NOT DETECTED NOT DETECTED   Candida auris NOT DETECTED NOT DETECTED   Candida glabrata NOT DETECTED NOT DETECTED   Candida krusei NOT DETECTED NOT DETECTED   Candida parapsilosis NOT DETECTED NOT DETECTED   Candida tropicalis NOT DETECTED NOT DETECTED   Cryptococcus neoformans/gattii NOT DETECTED NOT DETECTED    Einar Grad 08/07/2022  8:22 PM

## 2022-08-07 NOTE — Evaluation (Signed)
Occupational Therapy Evaluation Patient Details Name: Jill [name] MRN: [id number] DOB: 08-08-1932 Today's Date: 08/07/2022   History of Present Illness Jill [name] is a 88 y.o. female  presenting with AMS; to ED earlier this week and diagnosed with UTI, PNA and sent home; returned to ED when pt began having hallucinations; with medical history significant of DM, HTN, dementia, COPD, stage 4 CKD, and HLD   Clinical Impression   PTA, pt's adult grand daughter lived with her and assisted with all IADLs. Grand daughter present during eval. Pt performing STS transfers with min guard A and step pivot transfers with up to min A. Pt requesting to use commode, so step pivot to commode with min A to manage RW. Pt with bearing down efforts on potty causing dizziness/light headedness. Able to stand for therapist to wipe her, but ultimately needing to return to sit and with near syncopal episode in which could verbalize, but max difficulty following motor commands, requiring dependent transfer back to bed. Pt BP after lying ~1 minute 137/82. Upon discharge, will be recommending HHOT to optimize safety and independence with ADL.      Recommendations for follow up therapy are one component of a multi-disciplinary discharge planning process, led by the attending physician.  Recommendations may be updated based on patient status, additional functional criteria and insurance authorization.   Follow Up Recommendations  Home health OT     Assistance Recommended at Discharge Intermittent Supervision/Assistance  Patient can return home with the following A little help with walking and/or transfers;A little help with bathing/dressing/bathroom;Assistance with cooking/housework;Direct supervision/assist for medications management;Direct supervision/assist for financial management;Assist for transportation;Help with stairs or ramp for entrance    Functional Status Assessment  Patient has had a recent decline in  their functional status and demonstrates the ability to make significant improvements in function in a reasonable and predictable amount of time.  Equipment Recommendations  None recommended by OT    Recommendations for Other Services       Precautions / Restrictions Precautions Precautions: Fall Restrictions Weight Bearing Restrictions: No      Mobility Bed Mobility               General bed mobility comments: dependent transfer back to bed after drop in BP    Transfers Overall transfer level: Needs assistance Equipment used: Rolling walker (2 wheels) Transfers: Sit to/from Stand Sit to Stand: Min guard     Step pivot transfers: Min assist     General transfer comment: Min A for RW management during step pivot. Min guard A to rise.      Balance Overall balance assessment: Needs assistance Sitting-balance support: No upper extremity supported, Feet supported Sitting balance-Leahy Scale: Fair     Standing balance support: Bilateral upper extremity supported, During functional activity Standing balance-Leahy Scale: Poor Standing balance comment: reliant on RW                           ADL either performed or assessed with clinical judgement   ADL Overall ADL's : Needs assistance/impaired Eating/Feeding: Modified independent;Sitting   Grooming: Minimal assistance;Standing Grooming Details (indicate cue type and reason): Reliant on support of at least one UE this session Upper Body Bathing: Set up;Sitting   Lower Body Bathing: Minimal assistance;Sit to/from stand   Upper Body Dressing : Set up;Sitting   Lower Body Dressing: Minimal assistance;Sit to/from stand   Toilet Transfer: Min guard;Ambulation;Rolling walker (2 wheels);BSC/3in1;Total assistance (~4 ft.  Dependent transfer back to bed due to feeling faint.)   Toileting- Clothing Manipulation and Hygiene: Total assistance;Sit to/from stand Toileting - Clothing Manipulation Details  (indicate cue type and reason): Pt with increased effort to have BP and vagal down. With dizziness and requiring total A for posterior pericare. Ultimately needing to return to sit.     Functional mobility during ADLs: Min guard;Minimal assistance;Rolling walker (2 wheels) General ADL Comments: Min guard A for first stand from recliner and for rise from [facility] for pericare. Pt requiring min A for RW management. Pt with increased effort to have bowel movement, and likely drop in BP; requesting nursing staff to assist back to bed and dependent lift back to bed. Pt able to answer basic questions throughout, but unable to control body/follow commands. Upon return to supine BP 137/82/ RN and NT present and monitoring     Vision Baseline Vision/History: 1 Wears glasses Ability to See in Adequate Light: 0 Adequate Patient Visual Report: No change from baseline Vision Assessment?: No apparent visual deficits     Perception Perception Perception Tested?: No   Praxis Praxis Praxis tested?: Not tested    Pertinent Vitals/Pain Pain Assessment Pain Assessment: No/denies pain     Hand Dominance Right   Extremity/Trunk Assessment Upper Extremity Assessment Upper Extremity Assessment: Generalized weakness   Lower Extremity Assessment Lower Extremity Assessment: Generalized weakness       Communication Communication Communication: No difficulties   Cognition Arousal/Alertness: Awake/alert Behavior During Therapy: WFL for tasks assessed/performed Overall Cognitive Status: History of cognitive impairments - at baseline                                       General Comments  Pt granddaughter present and providing history. See ADL comments for vitals.    Exercises     Shoulder Instructions      Home Living Family/patient expects to be discharged to:: Private residence Living Arrangements: Other relatives (grand daughter; [name]) Available Help at Discharge: Family Type  of Home: House Home Access: Stairs to enter;Ramped entrance Entrance Stairs-Number of Steps: 1   Home Layout: One level     Bathroom Shower/Tub: Occupational psychologist: Handicapped height     Home Equipment: [facility] - single [employer] (2 wheels);Shower seat;BSC/3in1          Prior Functioning/Environment Prior Level of Function : Independent/Modified Independent;Needs assist             Mobility Comments: Overall modified independent in her home, uses a cane occasionally ADLs Comments: Typically does not need assist with ADL, bird bathes, granddaughter assists with IADL.        OT Problem List:        OT Treatment/Interventions: Self-care/ADL training;Therapeutic exercise;DME and/or AE instruction;Patient/family education;Balance training;Therapeutic activities    OT Goals(Current goals can be found in the care plan section) Acute Rehab OT Goals Patient Stated Goal: get to the bathroom OT Goal Formulation: With patient/family Time For Goal Achievement: 08/21/22 Potential to Achieve Goals: Good  OT Frequency: Min 2X/week    Co-evaluation              AM-PAC OT "6 Clicks" Daily Activity     Outcome Measure Help from another person eating meals?: None Help from another person taking care of personal grooming?: A Little Help from another person toileting, which includes using toliet, bedpan, or urinal?: A Little Help  from another person bathing (including washing, rinsing, drying)?: A Little Help from another person to put on and taking off regular upper body clothing?: A Little Help from another person to put on and taking off regular lower body clothing?: A Little 6 Click Score: 19   End of Session Equipment Utilized During Treatment: Gait belt;Rolling walker (2 wheels) Nurse Communication: Mobility status  Activity Tolerance: Patient tolerated treatment well Patient left:    OT Visit Diagnosis: Unsteadiness on feet (R26.81);Muscle  weakness (generalized) (M62.81);Other symptoms and signs involving cognitive function                Time: 1458-1535 OT Time Calculation (min): 37 min Charges:  OT General Charges $OT Visit: 1 Visit OT Evaluation $OT Eval Moderate Complexity: 1 Mod OT Treatments $Self Care/Home Management : 8-22 mins  Elder Cyphers, OTR/L [facility] Acute Rehabilitation Office: [phone]   Magnus Ivan 08/07/2022, 5:03 PM

## 2022-08-07 NOTE — Progress Notes (Signed)
PROGRESS NOTE    Jill [name]  XBM:[id number] DOB: January 26, 1932 DOA: 08/05/2022 PCP: Jinny Sanders, MD  Outpatient Specialists:     Brief Narrative:  As per H&P done on admission: "Jill [name] is a 88 y.o. female with medical history significant of DM, HTN, dementia, COPD, stage 4 CKD, and HLD presenting with AMS.  Her daughter reports that she had bronchitis last week and it got worse.  Her granddaughter took her to the ER Monday at [facility].  She was diagnosed with PNA and a UTI.  They thought it got a little better but she was increasingly confused.  She was having hallucinations and so they brought her back in.  She is not coughing as much but they think the UTI is the real problem.  Her WBC count was high.  She does not eat well.     Record from [facility] ER visit on 12/11 reviewed.  She had been treated with prednisone and doxy for bronchitis.  CXR was negative and CT chest showed resolving ground glass and tree-in-bud nodules, likely infectious/inflammatory.  Breast mass was also noted, new compared to 05/31/18 and slightly larger than 09/02/21.   UA was considered abnormal; she was given Rocephin -> Cefdinir.  Urine culture subsequently returned positive, sensitive to this therapy.  Renal CT with indeterminate R renal lesions, recommended for outpatient renal MRI when patient is able to follow directions and hold her breath ".     Assessment & Plan:   Principal Problem:   Acute metabolic encephalopathy Active Problems:   Type 2 diabetes mellitus with diabetic neuropathy, unspecified (HCC)   Hyperlipidemia associated with type 2 diabetes mellitus (HCC)   Major depression, recurrent (HCC)   Hypertension associated with diabetes ([place])   CKD (chronic kidney disease) stage 4, GFR 15-29 ml/min (HCC)   DNR (do not resuscitate)   Bronchitis   Stage 2 moderate COPD by GOLD classification ([place])   Mild dementia ([place])   UTI (urinary tract infection)   Leukocytosis   Breast mass in  female   Acute metabolic encephalopathy -Baseline dementia. -Reported to be encephalopathic (based on collateral information).   -Will rule out toxic versus metabolic versus combined toxic and metabolic. -Follow urine culture. -Follow-up blood culture. -Recently treated for bronchitis and reaction to prednisone is a consideration -Recently treated for Klebsiella UTI that was resistant only to Ampicillin   Bronchitis -Recently treated with prednisone and doxycycline   UTI -Recent Klebsiella UTI, appropriate treatment provided -UA appears improved -Would continue Cefdinir without further treatment -Renal CT showed indeterminate renal lesions; she is recommended to have a f/u renal MRI when her mental status will allow   Leukocytosis -Persistent leukocytosis since 08/2021 -Predominantly lymphocytosis -Consider referral to heme-onc on discharge.   Breast mass -Appears to be new and enlarging -Suggest outpatient f/u   Dementia/depression -Continue Aricept, hydroxyzine, Lexapro -Will order delirium precautions   Stage 4 CKD -Appears to be stable at this time -Attempt to avoid nephrotoxic medications   DM -A1c was 7.1 on 6/27 -Continue to monitor and support.   HTN -Slowly optimize.     HLD   COPD -Continue Spiriva, albuterol     DVT prophylaxis: Subcutaneous Lovenox Code Status: DO NOT RESUSCITATE Family Communication: Daughter Disposition Plan: Will depend on hospital course   Consultants:  None  Procedures:  None  Antimicrobials:  Oral Omnicef.   Subjective: No significant history from patient.  Objective: Vitals:   08/06/22 2000 08/07/22 0005 08/07/22 0272 08/07/22 5366  BP: 129/86 135/78 (!) 158/80 (!) 155/66  Pulse: 74 74 68 69  Resp: '18 18 18 16  '$ Temp: 98.4 F (36.9 C) 98.5 F (36.9 C) 98.2 F (36.8 C) (!) 97.5 F (36.4 C)  TempSrc: Oral Oral Oral Oral  SpO2: 95% 94% 97% 94%  Weight:      Height:       No intake or output data in  the 24 hours ending 08/07/22 1318 Filed Weights   08/06/22 0444  Weight: 76.2 kg    Examination:  General exam: Appears calm and comfortable  Respiratory system: Clear to auscultation.  Cardiovascular system: S1 & S2 heard Gastrointestinal system: Abdomen is soft and nontender.  Central nervous system: Awake and alert.   Extremities: No leg edema.  Data Reviewed: I have personally reviewed following labs and imaging studies  CBC: Recent Labs  Lab 08/01/22 1437 08/05/22 1836 08/06/22 0438 08/07/22 0210  WBC 23.4* 31.4* 23.2* 21.6*  NEUTROABS 9.1*  --  8.8*  --   HGB 13.2 13.4 11.9* 11.9*  HCT 41.2 41.6 37.3 36.2  MCV 93.0 93.1 93.3 92.8  PLT 304 245 188 294   Basic Metabolic Panel: Recent Labs  Lab 08/01/22 1437 08/05/22 1836 08/06/22 0438 08/07/22 0210  NA 135 137 140 136  K 4.6 4.4 4.5 5.1  CL 102 101 108 106  CO2 '24 22 22 22  '$ GLUCOSE 388* 284* 164* 191*  BUN 66* 67* 60* 59*  CREATININE 2.00* 2.06* 2.07* 2.03*  CALCIUM 9.6 9.6 8.7* 9.0  MG  --   --  2.0  --    GFR: Estimated Creatinine Clearance: 18.3 mL/min (A) (by C-G formula based on SCr of 2.03 mg/dL (H)). Liver Function Tests: Recent Labs  Lab 08/01/22 1437 08/05/22 1836 08/06/22 0438  AST 13* 11* 14*  ALT '14 11 13  '$ ALKPHOS 95 93 74  BILITOT 0.7 0.5 0.5  PROT 6.6 6.9 5.4*  ALBUMIN 3.5 3.9 2.9*   No results for input(s): "LIPASE", "AMYLASE" in the last 168 hours. Recent Labs  Lab 08/05/22 2225  AMMONIA 23   Coagulation Profile: Recent Labs  Lab 08/01/22 1437  INR 1.1   Cardiac Enzymes: No results for input(s): "CKTOTAL", "CKMB", "CKMBINDEX", "TROPONINI" in the last 168 hours. BNP (last 3 results) Recent Labs    09/02/21 1010  PROBNP 107.0*   HbA1C: No results for input(s): "HGBA1C" in the last 72 hours. CBG: Recent Labs  Lab 08/05/22 1733 08/06/22 1620 08/07/22 0848 08/07/22 1203  GLUCAP 273* 190* 147* 175*   Lipid Profile: No results for input(s): "CHOL", "HDL",  "LDLCALC", "TRIG", "CHOLHDL", "LDLDIRECT" in the last 72 hours. Thyroid Function Tests: Recent Labs    08/05/22 2213  TSH 3.449   Anemia Panel: No results for input(s): "VITAMINB12", "FOLATE", "FERRITIN", "TIBC", "IRON", "RETICCTPCT" in the last 72 hours. Urine analysis:    Component Value Date/Time   COLORURINE YELLOW 08/05/2022 2300   APPEARANCEUR CLEAR 08/05/2022 2300   APPEARANCEUR Hazy (A) 02/16/2021 1549   LABSPEC 1.017 08/05/2022 2300   PHURINE 5.0 08/05/2022 2300   GLUCOSEU NEGATIVE 08/05/2022 2300   HGBUR NEGATIVE 08/05/2022 2300   BILIRUBINUR NEGATIVE 08/05/2022 2300   BILIRUBINUR Negative 02/16/2021 1549   KETONESUR NEGATIVE 08/05/2022 2300   PROTEINUR 30 (A) 08/05/2022 2300   UROBILINOGEN 0.2 11/09/2020 1834   NITRITE NEGATIVE 08/05/2022 2300   LEUKOCYTESUR NEGATIVE 08/05/2022 2300   Sepsis Labs: '@LABRCNTIP'$ (procalcitonin:4,lacticidven:4)  ) Recent Results (from the past 240 hour(s))  Resp panel by RT-PCR (RSV,  Flu A&B, Covid) Anterior Nasal Swab     Status: None   Collection Time: 08/01/22  3:55 PM   Specimen: Anterior Nasal Swab  Result Value Ref Range Status   SARS Coronavirus 2 by RT PCR NEGATIVE NEGATIVE Final    Comment: (NOTE) SARS-CoV-2 target nucleic acids are NOT DETECTED.  The SARS-CoV-2 RNA is generally detectable in upper respiratory specimens during the acute phase of infection. The lowest concentration of SARS-CoV-2 viral copies this assay can detect is 138 copies/mL. A negative result does not preclude SARS-Cov-2 infection and should not be used as the sole basis for treatment or other patient management decisions. A negative result may occur with  improper specimen collection/handling, submission of specimen other than nasopharyngeal swab, presence of viral mutation(s) within the areas targeted by this assay, and inadequate number of viral copies(<138 copies/mL). A negative result must be combined with clinical observations, patient  history, and epidemiological information. The expected result is Negative.  Fact Sheet for Patients:  EntrepreneurPulse.com.au  Fact Sheet for Healthcare Providers:  IncredibleEmployment.be  This test is no t yet approved or cleared by the [place] FDA and  has been authorized for detection and/or diagnosis of SARS-CoV-2 by FDA under an Emergency Use Authorization (EUA). This EUA will remain  in effect (meaning this test can be used) for the duration of the COVID-19 declaration under Section 564(b)(1) of the Act, 21 U.S.C.section 360bbb-3(b)(1), unless the authorization is terminated  or revoked sooner.       Influenza A by PCR NEGATIVE NEGATIVE Final   Influenza B by PCR NEGATIVE NEGATIVE Final    Comment: (NOTE) The Xpert Xpress SARS-CoV-2/FLU/RSV plus assay is intended as an aid in the diagnosis of influenza from Nasopharyngeal swab specimens and should not be used as a sole basis for treatment. Nasal washings and aspirates are unacceptable for Xpert Xpress SARS-CoV-2/FLU/RSV testing.  Fact Sheet for Patients: EntrepreneurPulse.com.au  Fact Sheet for Healthcare Providers: IncredibleEmployment.be  This test is not yet approved or cleared by the [place] FDA and has been authorized for detection and/or diagnosis of SARS-CoV-2 by FDA under an Emergency Use Authorization (EUA). This EUA will remain in effect (meaning this test can be used) for the duration of the COVID-19 declaration under Section 564(b)(1) of the Act, 21 U.S.C. section 360bbb-3(b)(1), unless the authorization is terminated or revoked.     Resp Syncytial Virus by PCR NEGATIVE NEGATIVE Final    Comment: (NOTE) Fact Sheet for Patients: EntrepreneurPulse.com.au  Fact Sheet for Healthcare Providers: IncredibleEmployment.be  This test is not yet approved or cleared by the [place] FDA  and has been authorized for detection and/or diagnosis of SARS-CoV-2 by FDA under an Emergency Use Authorization (EUA). This EUA will remain in effect (meaning this test can be used) for the duration of the COVID-19 declaration under Section 564(b)(1) of the Act, 21 U.S.C. section 360bbb-3(b)(1), unless the authorization is terminated or revoked.  Performed at [facility], [address], [place], [place] [address]   Urine Culture     Status: Abnormal   Collection Time: 08/01/22  7:42 PM   Specimen: Urine, Random  Result Value Ref Range Status   Specimen Description   Final    URINE, RANDOM Performed at [facility], [address], [place], [place] [address]    Special Requests   Final    NONE Performed at [facility], [address] [place],  [address]    Culture >=100,000 COLONIES/mL KLEBSIELLA PNEUMONIAE (A)  Final   Report Status 08/04/2022 FINAL  Final   Organism ID, Bacteria KLEBSIELLA PNEUMONIAE (A)  Final      Susceptibility   Klebsiella pneumoniae - MIC*    AMPICILLIN RESISTANT Resistant     CEFAZOLIN <=4 SENSITIVE Sensitive     CEFEPIME <=0.12 SENSITIVE Sensitive     CEFTRIAXONE <=0.25 SENSITIVE Sensitive     CIPROFLOXACIN <=0.25 SENSITIVE Sensitive     GENTAMICIN <=1 SENSITIVE Sensitive     IMIPENEM <=0.25 SENSITIVE Sensitive     NITROFURANTOIN 32 SENSITIVE Sensitive     TRIMETH/SULFA <=20 SENSITIVE Sensitive     AMPICILLIN/SULBACTAM 4 SENSITIVE Sensitive     PIP/TAZO <=4 SENSITIVE Sensitive     * >=100,000 COLONIES/mL KLEBSIELLA PNEUMONIAE  Resp panel by RT-PCR (RSV, Flu A&B, Covid) Anterior Nasal Swab     Status: None   Collection Time: 08/05/22  6:36 PM   Specimen: Anterior Nasal Swab  Result Value Ref Range Status   SARS Coronavirus 2 by RT PCR NEGATIVE NEGATIVE Final    Comment: (NOTE) SARS-CoV-2 target nucleic acids are NOT DETECTED.  The SARS-CoV-2 RNA is generally detectable in upper respiratory specimens  during the acute phase of infection. The lowest concentration of SARS-CoV-2 viral copies this assay can detect is 138 copies/mL. A negative result does not preclude SARS-Cov-2 infection and should not be used as the sole basis for treatment or other patient management decisions. A negative result may occur with  improper specimen collection/handling, submission of specimen other than nasopharyngeal swab, presence of viral mutation(s) within the areas targeted by this assay, and inadequate number of viral copies(<138 copies/mL). A negative result must be combined with clinical observations, patient history, and epidemiological information. The expected result is Negative.  Fact Sheet for Patients:  EntrepreneurPulse.com.au  Fact Sheet for Healthcare Providers:  IncredibleEmployment.be  This test is no t yet approved or cleared by the [place] FDA and  has been authorized for detection and/or diagnosis of SARS-CoV-2 by FDA under an Emergency Use Authorization (EUA). This EUA will remain  in effect (meaning this test can be used) for the duration of the COVID-19 declaration under Section 564(b)(1) of the Act, 21 U.S.C.section 360bbb-3(b)(1), unless the authorization is terminated  or revoked sooner.       Influenza A by PCR NEGATIVE NEGATIVE Final   Influenza B by PCR NEGATIVE NEGATIVE Final    Comment: (NOTE) The Xpert Xpress SARS-CoV-2/FLU/RSV plus assay is intended as an aid in the diagnosis of influenza from Nasopharyngeal swab specimens and should not be used as a sole basis for treatment. Nasal washings and aspirates are unacceptable for Xpert Xpress SARS-CoV-2/FLU/RSV testing.  Fact Sheet for Patients: EntrepreneurPulse.com.au  Fact Sheet for Healthcare Providers: IncredibleEmployment.be  This test is not yet approved or cleared by the [place] FDA and has been authorized for detection  and/or diagnosis of SARS-CoV-2 by FDA under an Emergency Use Authorization (EUA). This EUA will remain in effect (meaning this test can be used) for the duration of the COVID-19 declaration under Section 564(b)(1) of the Act, 21 U.S.C. section 360bbb-3(b)(1), unless the authorization is terminated or revoked.     Resp Syncytial Virus by PCR NEGATIVE NEGATIVE Final    Comment: (NOTE) Fact Sheet for Patients: EntrepreneurPulse.com.au  Fact Sheet for Healthcare Providers: IncredibleEmployment.be  This test is not yet approved or cleared by the [place] FDA and has been authorized for detection and/or diagnosis of SARS-CoV-2 by FDA under an Emergency Use Authorization (EUA). This EUA will remain  in effect (meaning this test can be used) for the duration of the COVID-19 declaration under Section 564(b)(1) of the Act, 21 U.S.C. section 360bbb-3(b)(1), unless the authorization is terminated or revoked.  Performed at [facility], [address] [place], [place] [address]   Blood culture (routine x 2)     Status: None (Preliminary result)   Collection Time: 08/05/22 10:18 PM   Specimen: BLOOD  Result Value Ref Range Status   Specimen Description   Final    BLOOD LEFT ANTECUBITAL Performed at [facility], [address], [place], [place] [address]    Special Requests   Final    BOTTLES DRAWN AEROBIC AND ANAEROBIC Blood Culture adequate volume Performed at [facility], [address] [place], [place] [address]    Culture   Final    NO GROWTH < 24 HOURS Performed at [facility], [place] [address] [place], [place] [address]    Report Status PENDING  Incomplete  Blood culture (routine x 2)     Status: None (Preliminary result)   Collection Time: 08/05/22 10:20 PM   Specimen: BLOOD  Result Value Ref Range Status   Specimen Description   Final    BLOOD RIGHT  ANTECUBITAL Performed at [facility], [address] [place], [place] [address]    Special Requests   Final    BOTTLES DRAWN AEROBIC AND ANAEROBIC Blood Culture adequate volume Performed at [facility], [address] [place], [place] [address]    Culture   Final    NO GROWTH < 24 HOURS Performed at [facility], [place] [address], [place], [place] [address]    Report Status PENDING  Incomplete         Radiology Studies: CT Head Wo Contrast  Result Date: 08/05/2022 CLINICAL DATA:  Delirium EXAM: CT HEAD WITHOUT CONTRAST TECHNIQUE: Contiguous axial images were obtained from the base of the skull through the vertex without intravenous contrast. RADIATION DOSE REDUCTION: This exam was performed according to the departmental dose-optimization program which includes automated exposure control, adjustment of the mA and/or kV according to patient size and/or use of iterative reconstruction technique. COMPARISON:  02/26/2021 FINDINGS: Brain: No evidence of acute infarction, hemorrhage, hydrocephalus, extra-axial collection or mass lesion/mass effect. Global cortical atrophy.  Secondary tracheal ir prominence. Extensive subcortical white matter and periventricular small vessel ischemic changes. Vascular: Intracranial atherosclerosis. Skull: Normal. Negative for fracture or focal lesion. Sinuses/Orbits: The visualized paranasal sinuses are essentially clear. The mastoid air cells are unopacified. Other: None. IMPRESSION: No evidence of acute intracranial abnormality. Atrophy with small vessel ischemic changes. Electronically Signed   By: Julian Hy M.D.   On: 08/05/2022 20:59   DG Chest 2 View  Result Date: 08/05/2022 CLINICAL DATA:  Altered mental status, cough EXAM: CHEST - 2 VIEW COMPARISON:  08/01/2022 chest CT and radiograph FINDINGS: Mild central airway thickening. The lungs appear otherwise clear on conventional radiography. Heart size  within normal limits. Mild tortuosity of the thoracic aorta. No significant bony abnormality observed. No blunting of the costophrenic angles. IMPRESSION: 1. Airway thickening is present, suggesting bronchitis or reactive airways disease. 2. Mild tortuosity of the thoracic aorta. Electronically Signed   By: Van Clines M.D.   On: 08/05/2022 18:03        Scheduled Meds:  allopurinol  50 mg Oral QODAY   cefdinir  300 mg Oral BID   docusate sodium  100 mg Oral BID   donepezil  10  mg Oral QHS   enoxaparin (LOVENOX) injection  30 mg Subcutaneous Q24H   escitalopram  20 mg Oral Daily   feeding supplement (GLUCERNA SHAKE)  237 mL Oral TID BM   insulin aspart  0-9 Units Subcutaneous TID WC   loratadine  10 mg Oral Daily   multivitamin with minerals  1 tablet Oral Daily   umeclidinium bromide  1 puff Inhalation Daily   Continuous Infusions:  lactated ringers 75 mL/hr at 08/06/22 1005     LOS: 1 day    Time spent: 55 minutes    Dana Allan, MD  [facility] Pager #: [phone] 7PM-7AM contact night coverage as above

## 2022-08-07 NOTE — Evaluation (Signed)
Physical Therapy Evaluation Patient Details Name: Jill [name] MRN: [id number] DOB: 09-Jul-1932 Today's Date: 08/07/2022  History of Present Illness  Jill [name] is a 88 y.o. female  presenting with AMS; to ED earlier this week and diagnosed with UTI, PNA and sent home; returned to ED when pt began having hallucinations; with medical history significant of DM, HTN, dementia, COPD, stage 4 CKD, and HLD  Clinical Impression  Pt admitted with above diagnosis. Lives at home with family, in a single-level home with a few steps to enter; Prior to admission, pt was able to manage in home tasks and walking distance modified independently, using a cane for household ambulation; Presents to PT with generalized weakness, decr activity tolerance, fatigue; Min assist to getup to EOB, stand, and step pivot transfer with RW;   Pt currently with functional limitations due to the deficits listed below (see PT Problem List). Pt will benefit from skilled PT to increase their independence and safety with mobility to allow discharge to the venue listed below.          Recommendations for follow up therapy are one component of a multi-disciplinary discharge planning process, led by the attending physician.  Recommendations may be updated based on patient status, additional functional criteria and insurance authorization.  Follow Up Recommendations Home health PT      Assistance Recommended at Discharge Intermittent Supervision/Assistance  Patient can return home with the following  A little help with walking and/or transfers;A little help with bathing/dressing/bathroom    Equipment Recommendations BSC/3in1  Recommendations for Other Services       Functional Status Assessment Patient has had a recent decline in their functional status and demonstrates the ability to make significant improvements in function in a reasonable and predictable amount of time.     Precautions / Restrictions  Precautions Precautions: Fall Restrictions Weight Bearing Restrictions: No      Mobility  Bed Mobility Overal bed mobility: Needs Assistance Bed Mobility: Supine to Sit     Supine to sit: Min assist     General bed mobility comments: Min assist and cues to scoot fully to EOB so that feet touch teh floor    Transfers Overall transfer level: Needs assistance Equipment used: 1 person hand held assist, Rolling walker (2 wheels) Transfers: Sit to/from Stand, Bed to chair/wheelchair/BSC Sit to Stand: Mod assist   Step pivot transfers: Mod assist       General transfer comment: Mod assist to  rise and steady from teh bed; fearful of falling, so used teh RW for more support with pivot steps bed to chair    Ambulation/Gait                  Stairs            Wheelchair Mobility    Modified Rankin (Stroke Patients Only)       Balance                                             Pertinent Vitals/Pain Pain Assessment Pain Assessment: No/denies pain    Home Living Family/patient expects to be discharged to:: Private residence Living Arrangements: Other relatives (neice) Available Help at Discharge: Family Type of Home: House Home Access: Stairs to enter   Technical brewer of Steps: 1   Home Layout: One level Home Equipment: Shower seat;Cane - single  point      Prior Function Prior Level of Function : Independent/Modified Independent;Needs assist             Mobility Comments: Overall modified independent in her home, uses a cane occasionally ADLs Comments: typically does not need assist with showering and dressing; assist with IADLs     Hand Dominance        Extremity/Trunk Assessment   Upper Extremity Assessment Upper Extremity Assessment: Generalized weakness    Lower Extremity Assessment Lower Extremity Assessment: Generalized weakness       Communication   Communication: No difficulties  Cognition  Arousal/Alertness: Awake/alert Behavior During Therapy: WFL for tasks assessed/performed Overall Cognitive Status: History of cognitive impairments - at baseline                                          General Comments General comments (skin integrity, edema, etc.): session conducted on room air and O2 sat 97% at end of session    Exercises     Assessment/Plan    PT Assessment Patient needs continued PT services  PT Problem List Decreased strength;Decreased activity tolerance;Decreased balance;Decreased mobility;Decreased coordination;Decreased cognition;Decreased safety awareness;Decreased knowledge of use of DME;Decreased knowledge of precautions       PT Treatment Interventions DME instruction;Gait training;Stair training;Functional mobility training;Therapeutic activities;Therapeutic exercise;Balance training;Neuromuscular re-education;Cognitive remediation;Patient/family education    PT Goals (Current goals can be found in the Care Plan section)  Acute Rehab PT Goals Patient Stated Goal: Did not state, agreeable to getting up PT Goal Formulation: With patient/family Time For Goal Achievement: 08/21/22 Potential to Achieve Goals: Good    Frequency Min 3X/week     Co-evaluation               AM-PAC PT "6 Clicks" Mobility  Outcome Measure Help needed turning from your back to your side while in a flat bed without using bedrails?: A Little Help needed moving from lying on your back to sitting on the side of a flat bed without using bedrails?: A Lot Help needed moving to and from a bed to a chair (including a wheelchair)?: A Lot Help needed standing up from a chair using your arms (e.g., wheelchair or bedside chair)?: A Lot Help needed to walk in hospital room?: A Lot Help needed climbing 3-5 steps with a railing? : A Lot 6 Click Score: 13    End of Session Equipment Utilized During Treatment: Gait belt Activity Tolerance: Patient tolerated  treatment well Patient left: in chair;with call bell/phone within reach;with chair alarm set;with family/visitor present Nurse Communication: Mobility status (and IV is leaking) PT Visit Diagnosis: Unsteadiness on feet (R26.81);Other abnormalities of gait and mobility (R26.89);Muscle weakness (generalized) (M62.81)    Time: 3500-9381 PT Time Calculation (min) (ACUTE ONLY): 29 min   Charges:   PT Evaluation $PT Eval Moderate Complexity: 1 Mod PT Treatments $Therapeutic Activity: 8-22 mins        Roney Marion, PT  Acute Rehabilitation Services Office [phone]   [place] 08/07/2022, 2:42 PM

## 2022-08-08 DIAGNOSIS — R4182 Altered mental status, unspecified: Secondary | ICD-10-CM | POA: Diagnosis not present

## 2022-08-08 DIAGNOSIS — J181 Lobar pneumonia, unspecified organism: Secondary | ICD-10-CM

## 2022-08-08 DIAGNOSIS — G9341 Metabolic encephalopathy: Secondary | ICD-10-CM | POA: Diagnosis not present

## 2022-08-08 LAB — GLUCOSE, CAPILLARY
Glucose-Capillary: 163 mg/dL — ABNORMAL HIGH (ref 70–99)
Glucose-Capillary: 274 mg/dL — ABNORMAL HIGH (ref 70–99)
Glucose-Capillary: 128 mg/dL — ABNORMAL HIGH (ref 70–99)
Glucose-Capillary: 192 mg/dL — ABNORMAL HIGH (ref 70–99)

## 2022-08-08 NOTE — Progress Notes (Signed)
PROGRESS NOTE    Jill [name]  YIF:[id number] DOB: 1931/11/15 DOA: 08/05/2022 PCP: Jinny Sanders, MD   Brief Narrative:  This 88 yrs old female with PMH significant for DM, HTN, dementia, COPD, stage 4 CKD, and HLD presenting with confusion. Her daughter reports that she had bronchitis last week and it got worse. Her granddaughter took her to the ER Monday at [facility].  She was diagnosed with PNA and UTI.  She was discharged home on antibiotics.She was having hallucinations and so they brought her back in.  She is not coughing as much but they think the UTI is the real problem.  Patient is admitted for further evaluation. [facility] ED visit: She was treated with prednisone and doxycycline for bronchitis.  Chest x-ray was negative but CT chest shows resolving groundglass and 3 in bud nodules likely infectious or inflammatory.  Also found to have a breast mass which was new as compared to the prior imaging.  UA was abnormal patient was given Rocephin and was discharged on cefdinir.  Urine culture sensitive to cefdinir.  Assessment & Plan:   Principal Problem:   Acute metabolic encephalopathy Active Problems:   Type 2 diabetes mellitus with diabetic neuropathy, unspecified (HCC)   Hyperlipidemia associated with type 2 diabetes mellitus (HCC)   Major depression, recurrent (HCC)   Hypertension associated with diabetes ([place])   CKD (chronic kidney disease) stage 4, GFR 15-29 ml/min (HCC)   DNR (do not resuscitate)   Bronchitis   Stage 2 moderate COPD by GOLD classification ([place])   Mild dementia ([place])   UTI (urinary tract infection)   Leukocytosis   Breast mass in female  Acute metabolic Encephalopathy: Patient has baseline dementia. Likely multifactorial could be urinary tract infection, pneumonia. Follow up urine cultures. Blood cultures. She was recently treated for bronchitis. She was also recently treated for Klebsiella UTI that was resistant only to Ampicillin. Continue cefdinir for UTI.    Bronchitis: She was recently treated with prednisone and doxycycline.   UTI: She was appropriately treated for recent Klebsiella UTI. UA appears improved. Continue with Cefdinir without further treatment Renal CT showed indeterminate renal lesions;  She is recommended to have a f/u renal MRI when her mental status improve.   Leucocytosis. She has Persistent leukocytosis since 08/2021 Predominantly lymphocytosis Consider referral to heme-onc on discharge.   Breast Mass: Appears to be new and enlarging Outpatient PCP follow up.  Dementia / depression: Continue Aricept, hydroxyzine,Lexapro. Continue Delirium precautions.  CKD stage IV Appears to be stable at this time Avoid nephrotoxic medications.   Diabetes Mellitus : HbA1c was 7.1 on 02/15/22 Continue sliding scale.   Essential HTN Continue Home medications.  COPD Continue Spiriva, albuterol.   DVT prophylaxis: Lovenox Code Status: DNR Family Communication: No family at bed side. Disposition Plan:   Status is: Inpatient Remains inpatient appropriate because: Admitted for altered mental status likely multifactorial,  could be due to UTI.    Consultants:  None  Procedures:None  Antimicrobials:   Anti-infectives (From admission, onward)    Start     Dose/Rate Route Frequency Ordered Stop   08/06/22 2000  cefTRIAXone (ROCEPHIN) 1 g in sodium chloride 0.9 % 100 mL IVPB  Status:  Discontinued        1 g 200 mL/hr over 30 Minutes Intravenous Every 24 hours 08/05/22 2112 08/06/22 1020   08/06/22 1115  cefdinir (OMNICEF) capsule 300 mg        300 mg Oral 2 times daily 08/06/22 1020  08/05/22 2330  ciprofloxacin (CIPRO) IVPB 400 mg        400 mg 200 mL/hr over 60 Minutes Intravenous  Once 08/05/22 2319 08/06/22 0132   08/05/22 2045  cefTRIAXone (ROCEPHIN) 1 g in sodium chloride 0.9 % 100 mL IVPB  Status:  Discontinued        1 g 200 mL/hr over 30 Minutes Intravenous  Once 08/05/22 2032 08/06/22 1020        Subjective: Seen and examined at bedside. Overnight events noted.   She still appears slightly confused but following commands.   She appears very deconditioned.  Objective: Vitals:   08/07/22 1953 08/08/22 0322 08/08/22 0913 08/08/22 0946  BP: 137/71 (!) 172/64 (!) 141/58   Pulse: 87 73 80   Resp: '18 16 16   '$ Temp: 98.6 F (37 C) 98.1 F (36.7 C) 97.8 F (36.6 C)   TempSrc: Oral Oral Oral   SpO2: 94% 92% 93% 94%  Weight:      Height:        Intake/Output Summary (Last 24 hours) at 08/08/2022 1311 Last data filed at 08/08/2022 0336 Gross per 24 hour  Intake 2360 ml  Output 1000 ml  Net 1360 ml   Filed Weights   08/06/22 0444  Weight: 76.2 kg    Examination:  General exam: Appears comfortable, not in any acute distress.  Very deconditioned. Respiratory system: CTA bilaterally, respiratory for normal, RR 15. Cardiovascular system: S1 & S2 heard, regular rate and rhythm, no murmur. Gastrointestinal system: Abdomen is soft, non tender, non distended, BS+ Central nervous system: Alert and oriented x 1. No focal neurological deficits. Extremities: No edema, no cyanosis, no clubbing Skin: No rashes, lesions or ulcers Psychiatry: Judgement and insight appear normal. Mood & affect appropriate.     Data Reviewed: I have personally reviewed following labs and imaging studies  CBC: Recent Labs  Lab 08/01/22 1437 08/05/22 1836 08/06/22 0438 08/07/22 0210  WBC 23.4* 31.4* 23.2* 21.6*  NEUTROABS 9.1*  --  8.8*  --   HGB 13.2 13.4 11.9* 11.9*  HCT 41.2 41.6 37.3 36.2  MCV 93.0 93.1 93.3 92.8  PLT 304 245 188 376   Basic Metabolic Panel: Recent Labs  Lab 08/01/22 1437 08/05/22 1836 08/06/22 0438 08/07/22 0210  NA 135 137 140 136  K 4.6 4.4 4.5 5.1  CL 102 101 108 106  CO2 '24 22 22 22  '$ GLUCOSE 388* 284* 164* 191*  BUN 66* 67* 60* 59*  CREATININE 2.00* 2.06* 2.07* 2.03*  CALCIUM 9.6 9.6 8.7* 9.0  MG  --   --  2.0  --    GFR: Estimated Creatinine  Clearance: 18.3 mL/min (A) (by C-G formula based on SCr of 2.03 mg/dL (H)). Liver Function Tests: Recent Labs  Lab 08/01/22 1437 08/05/22 1836 08/06/22 0438  AST 13* 11* 14*  ALT '14 11 13  '$ ALKPHOS 95 93 74  BILITOT 0.7 0.5 0.5  PROT 6.6 6.9 5.4*  ALBUMIN 3.5 3.9 2.9*   No results for input(s): "LIPASE", "AMYLASE" in the last 168 hours. Recent Labs  Lab 08/05/22 2225  AMMONIA 23   Coagulation Profile: Recent Labs  Lab 08/01/22 1437  INR 1.1   Cardiac Enzymes: No results for input(s): "CKTOTAL", "CKMB", "CKMBINDEX", "TROPONINI" in the last 168 hours. BNP (last 3 results) Recent Labs    09/02/21 1010  PROBNP 107.0*   HbA1C: No results for input(s): "HGBA1C" in the last 72 hours. CBG: Recent Labs  Lab 08/07/22 1203 08/07/22 1541  08/07/22 2057 08/08/22 0820 08/08/22 1147  GLUCAP 175* 192* 253* 163* 274*   Lipid Profile: No results for input(s): "CHOL", "HDL", "LDLCALC", "TRIG", "CHOLHDL", "LDLDIRECT" in the last 72 hours. Thyroid Function Tests: Recent Labs    08/05/22 2213  TSH 3.449   Anemia Panel: No results for input(s): "VITAMINB12", "FOLATE", "FERRITIN", "TIBC", "IRON", "RETICCTPCT" in the last 72 hours. Sepsis Labs: Recent Labs  Lab 08/01/22 1437 08/01/22 2026 08/05/22 2213  LATICACIDVEN 1.2 1.3 2.0*    Recent Results (from the past 240 hour(s))  Resp panel by RT-PCR (RSV, Flu A&B, Covid) Anterior Nasal Swab     Status: None   Collection Time: 08/01/22  3:55 PM   Specimen: Anterior Nasal Swab  Result Value Ref Range Status   SARS Coronavirus 2 by RT PCR NEGATIVE NEGATIVE Final    Comment: (NOTE) SARS-CoV-2 target nucleic acids are NOT DETECTED.  The SARS-CoV-2 RNA is generally detectable in upper respiratory specimens during the acute phase of infection. The lowest concentration of SARS-CoV-2 viral copies this assay can detect is 138 copies/mL. A negative result does not preclude SARS-Cov-2 infection and should not be used as the sole  basis for treatment or other patient management decisions. A negative result may occur with  improper specimen collection/handling, submission of specimen other than nasopharyngeal swab, presence of viral mutation(s) within the areas targeted by this assay, and inadequate number of viral copies(<138 copies/mL). A negative result must be combined with clinical observations, patient history, and epidemiological information. The expected result is Negative.  Fact Sheet for Patients:  EntrepreneurPulse.com.au  Fact Sheet for Healthcare Providers:  IncredibleEmployment.be  This test is no t yet approved or cleared by the [place] FDA and  has been authorized for detection and/or diagnosis of SARS-CoV-2 by FDA under an Emergency Use Authorization (EUA). This EUA will remain  in effect (meaning this test can be used) for the duration of the COVID-19 declaration under Section 564(b)(1) of the Act, 21 U.S.C.section 360bbb-3(b)(1), unless the authorization is terminated  or revoked sooner.       Influenza A by PCR NEGATIVE NEGATIVE Final   Influenza B by PCR NEGATIVE NEGATIVE Final    Comment: (NOTE) The Xpert Xpress SARS-CoV-2/FLU/RSV plus assay is intended as an aid in the diagnosis of influenza from Nasopharyngeal swab specimens and should not be used as a sole basis for treatment. Nasal washings and aspirates are unacceptable for Xpert Xpress SARS-CoV-2/FLU/RSV testing.  Fact Sheet for Patients: EntrepreneurPulse.com.au  Fact Sheet for Healthcare Providers: IncredibleEmployment.be  This test is not yet approved or cleared by the [place] FDA and has been authorized for detection and/or diagnosis of SARS-CoV-2 by FDA under an Emergency Use Authorization (EUA). This EUA will remain in effect (meaning this test can be used) for the duration of the COVID-19 declaration under Section 564(b)(1) of the Act,  21 U.S.C. section 360bbb-3(b)(1), unless the authorization is terminated or revoked.     Resp Syncytial Virus by PCR NEGATIVE NEGATIVE Final    Comment: (NOTE) Fact Sheet for Patients: EntrepreneurPulse.com.au  Fact Sheet for Healthcare Providers: IncredibleEmployment.be  This test is not yet approved or cleared by the [place] FDA and has been authorized for detection and/or diagnosis of SARS-CoV-2 by FDA under an Emergency Use Authorization (EUA). This EUA will remain in effect (meaning this test can be used) for the duration of the COVID-19 declaration under Section 564(b)(1) of the Act, 21 U.S.C. section 360bbb-3(b)(1), unless the authorization is terminated or revoked.  Performed at  [facility], [address] [place], [place] [address]   Urine Culture     Status: Abnormal   Collection Time: 08/01/22  7:42 PM   Specimen: Urine, Random  Result Value Ref Range Status   Specimen Description   Final    URINE, RANDOM Performed at [facility], [address] [place], [place] [address]    Special Requests   Final    NONE Performed at [facility], [address] [place], [place] [address]    Culture >=100,000 COLONIES/mL KLEBSIELLA PNEUMONIAE (A)  Final   Report Status 08/04/2022 FINAL  Final   Organism ID, Bacteria KLEBSIELLA PNEUMONIAE (A)  Final      Susceptibility   Klebsiella pneumoniae - MIC*    AMPICILLIN RESISTANT Resistant     CEFAZOLIN <=4 SENSITIVE Sensitive     CEFEPIME <=0.12 SENSITIVE Sensitive     CEFTRIAXONE <=0.25 SENSITIVE Sensitive     CIPROFLOXACIN <=0.25 SENSITIVE Sensitive     GENTAMICIN <=1 SENSITIVE Sensitive     IMIPENEM <=0.25 SENSITIVE Sensitive     NITROFURANTOIN 32 SENSITIVE Sensitive     TRIMETH/SULFA <=20 SENSITIVE Sensitive     AMPICILLIN/SULBACTAM 4 SENSITIVE Sensitive     PIP/TAZO <=4 SENSITIVE Sensitive     * >=100,000 COLONIES/mL KLEBSIELLA PNEUMONIAE  Resp  panel by RT-PCR (RSV, Flu A&B, Covid) Anterior Nasal Swab     Status: None   Collection Time: 08/05/22  6:36 PM   Specimen: Anterior Nasal Swab  Result Value Ref Range Status   SARS Coronavirus 2 by RT PCR NEGATIVE NEGATIVE Final    Comment: (NOTE) SARS-CoV-2 target nucleic acids are NOT DETECTED.  The SARS-CoV-2 RNA is generally detectable in upper respiratory specimens during the acute phase of infection. The lowest concentration of SARS-CoV-2 viral copies this assay can detect is 138 copies/mL. A negative result does not preclude SARS-Cov-2 infection and should not be used as the sole basis for treatment or other patient management decisions. A negative result may occur with  improper specimen collection/handling, submission of specimen other than nasopharyngeal swab, presence of viral mutation(s) within the areas targeted by this assay, and inadequate number of viral copies(<138 copies/mL). A negative result must be combined with clinical observations, patient history, and epidemiological information. The expected result is Negative.  Fact Sheet for Patients:  EntrepreneurPulse.com.au  Fact Sheet for Healthcare Providers:  IncredibleEmployment.be  This test is no t yet approved or cleared by the [place] FDA and  has been authorized for detection and/or diagnosis of SARS-CoV-2 by FDA under an Emergency Use Authorization (EUA). This EUA will remain  in effect (meaning this test can be used) for the duration of the COVID-19 declaration under Section 564(b)(1) of the Act, 21 U.S.C.section 360bbb-3(b)(1), unless the authorization is terminated  or revoked sooner.       Influenza A by PCR NEGATIVE NEGATIVE Final   Influenza B by PCR NEGATIVE NEGATIVE Final    Comment: (NOTE) The Xpert Xpress SARS-CoV-2/FLU/RSV plus assay is intended as an aid in the diagnosis of influenza from Nasopharyngeal swab specimens and should not be used as a  sole basis for treatment. Nasal washings and aspirates are unacceptable for Xpert Xpress SARS-CoV-2/FLU/RSV testing.  Fact Sheet for Patients: EntrepreneurPulse.com.au  Fact Sheet for Healthcare Providers: IncredibleEmployment.be  This test is not yet approved or cleared by the [place] FDA and has been authorized for detection and/or diagnosis of SARS-CoV-2 by FDA under an Emergency Use Authorization (EUA). This EUA will remain  in effect (meaning this test can be used) for the duration of the COVID-19 declaration under Section 564(b)(1) of the Act, 21 U.S.C. section 360bbb-3(b)(1), unless the authorization is terminated or revoked.     Resp Syncytial Virus by PCR NEGATIVE NEGATIVE Final    Comment: (NOTE) Fact Sheet for Patients: EntrepreneurPulse.com.au  Fact Sheet for Healthcare Providers: IncredibleEmployment.be  This test is not yet approved or cleared by the [place] FDA and has been authorized for detection and/or diagnosis of SARS-CoV-2 by FDA under an Emergency Use Authorization (EUA). This EUA will remain in effect (meaning this test can be used) for the duration of the COVID-19 declaration under Section 564(b)(1) of the Act, 21 U.S.C. section 360bbb-3(b)(1), unless the authorization is terminated or revoked.  Performed at [facility], [address] [place], [place] [address]   Blood culture (routine x 2)     Status: Abnormal (Preliminary result)   Collection Time: 08/05/22 10:18 PM   Specimen: BLOOD  Result Value Ref Range Status   Specimen Description   Final    BLOOD LEFT ANTECUBITAL Performed at [facility], [address] [place], [place] [address]    Special Requests   Final    BOTTLES DRAWN AEROBIC AND ANAEROBIC Blood Culture adequate volume Performed at [facility], [address], [place], [place]  [address]    Culture  Setup Time   Final    GRAM POSITIVE COCCI AEROBIC BOTTLE ONLY CRITICAL VALUE NOTED.  VALUE IS CONSISTENT WITH PREVIOUSLY REPORTED AND CALLED VALUE. Performed at [facility], [place] [address], [place], [place] [address]    Culture STAPHYLOCOCCUS HOMINIS (A)  Final   Report Status PENDING  Incomplete  Blood culture (routine x 2)     Status: Abnormal (Preliminary result)   Collection Time: 08/05/22 10:20 PM   Specimen: BLOOD  Result Value Ref Range Status   Specimen Description   Final    BLOOD RIGHT ANTECUBITAL Performed at [facility], [address], [place], [place] [address]    Special Requests   Final    BOTTLES DRAWN AEROBIC AND ANAEROBIC Blood Culture adequate volume Performed at [facility], [address] [place], [place] [address]    Culture  Setup Time   Final    AEROBIC BOTTLE ONLY GRAM POSITIVE COCCI IN CLUSTERS Organism ID to follow CRITICAL RESULT CALLED TO, READ BACK BY AND VERIFIED WITH:  C/ PHARMD M. BITONTI 08/07/22 2020 A. LAFRANCE    Culture (A)  Final    STAPHYLOCOCCUS HOMINIS SUSCEPTIBILITIES TO FOLLOW Performed at [facility], [place] [address] [place],  [address]    Report Status PENDING  Incomplete  Blood Culture ID Panel (Reflexed)     Status: Abnormal   Collection Time: 08/05/22 10:20 PM  Result Value Ref Range Status   Enterococcus faecalis NOT DETECTED NOT DETECTED Final   Enterococcus Faecium NOT DETECTED NOT DETECTED Final   Listeria monocytogenes NOT DETECTED NOT DETECTED Final   Staphylococcus species DETECTED (A) NOT DETECTED Final    Comment: CRITICAL RESULT CALLED TO, READ BACK BY AND VERIFIED WITH:  C/ PHARMD M. BITONTI 08/07/22 2020 A. LAFRANCE    Staphylococcus aureus (BCID) NOT DETECTED NOT DETECTED Final   Staphylococcus epidermidis NOT DETECTED NOT DETECTED Final   Staphylococcus lugdunensis NOT DETECTED NOT DETECTED Final   Streptococcus species NOT  DETECTED NOT DETECTED Final   Streptococcus agalactiae NOT DETECTED NOT DETECTED Final   Streptococcus pneumoniae NOT DETECTED NOT  DETECTED Final   Streptococcus pyogenes NOT DETECTED NOT DETECTED Final   A.calcoaceticus-baumannii NOT DETECTED NOT DETECTED Final   Bacteroides fragilis NOT DETECTED NOT DETECTED Final   Enterobacterales NOT DETECTED NOT DETECTED Final   Enterobacter cloacae complex NOT DETECTED NOT DETECTED Final   Escherichia coli NOT DETECTED NOT DETECTED Final   Klebsiella aerogenes NOT DETECTED NOT DETECTED Final   Klebsiella oxytoca NOT DETECTED NOT DETECTED Final   Klebsiella pneumoniae NOT DETECTED NOT DETECTED Final   Proteus species NOT DETECTED NOT DETECTED Final   Salmonella species NOT DETECTED NOT DETECTED Final   Serratia marcescens NOT DETECTED NOT DETECTED Final   Haemophilus influenzae NOT DETECTED NOT DETECTED Final   Neisseria meningitidis NOT DETECTED NOT DETECTED Final   Pseudomonas aeruginosa NOT DETECTED NOT DETECTED Final   Stenotrophomonas maltophilia NOT DETECTED NOT DETECTED Final   Candida albicans NOT DETECTED NOT DETECTED Final   Candida auris NOT DETECTED NOT DETECTED Final   Candida glabrata NOT DETECTED NOT DETECTED Final   Candida krusei NOT DETECTED NOT DETECTED Final   Candida parapsilosis NOT DETECTED NOT DETECTED Final   Candida tropicalis NOT DETECTED NOT DETECTED Final   Cryptococcus neoformans/gattii NOT DETECTED NOT DETECTED Final    Comment: Performed at [facility], [address] [place], [place] [address]  Urine Culture     Status: None   Collection Time: 08/05/22 11:00 PM   Specimen: In/Out Cath Urine  Result Value Ref Range Status   Specimen Description   Final    IN/OUT CATH URINE Performed at [facility], [address], [place], [place] [address]    Special Requests   Final    NONE Performed at [facility], [address], [place], [place] [address]    Culture    Final    NO GROWTH Performed at [facility], [place] [address], [place], [place] [address]    Report Status 08/07/2022 FINAL  Final    Radiology Studies: No results found.  Scheduled Meds:  allopurinol  50 mg Oral QODAY   cefdinir  300 mg Oral BID   docusate sodium  100 mg Oral BID   donepezil  10 mg Oral QHS   enoxaparin (LOVENOX) injection  30 mg Subcutaneous Q24H   escitalopram  20 mg Oral Daily   feeding supplement (GLUCERNA SHAKE)  237 mL Oral TID BM   insulin aspart  0-9 Units Subcutaneous TID WC   loratadine  10 mg Oral Daily   multivitamin with minerals  1 tablet Oral Daily   umeclidinium bromide  1 puff Inhalation Daily   Continuous Infusions:  lactated ringers 75 mL/hr at 08/08/22 1245     LOS: 2 days    Time spent: 50 mins    Taelyn Broecker, MD [facility]   If 7PM-7AM, please contact night-coverage

## 2022-08-08 NOTE — Consult Note (Signed)
[facility]    Date of Admission:  08/05/2022     Reason for Consult: ams, bacteremia    Referring Provider: Shawna Clamp     Abx: 12/16-c cefdinir  12/16 ceftriaxone 12/15 cipro       12/11-15 doxy   Assessment: 88 yo female with bilateral hip replacement distant past, dm2, copd, stage 4 ckd, admitted 12/16 for continued AMS and cough, with admitting bcx staph hominis of unclear significance   12/15 bcx 2 sets staph hominis pending susceptibility; unclear significance 12/11 ucx >100k kleb pna S cefazolin  She was seen in [place] 12/11 for ams, 1-2 weeks mostly dry cough. Ct scan showed improved prior tree in bud changes and nodular changes but some remains (compared to ct scan almost a year prior). She was given doxy but returned 12/16 due to advice of PCP given continued weakness/ams (not worse)  She has had chronic lymphocytosis that is moderate.  She has periods of lucidity and during this admission doesn't report any uti sx although has had doxy--> this admission cefdinir  Her sx remains dry cough which has been new, and I believe that she likely has pneumonia. She hasn't needed oxygen and her cough had improved slowly   She has bilateral hip joint prosthesis but no sx there and no intravascular catheter. I do not know what to make of the staph hominis on bcx but suspect contaminant  Incidentally chest imaging does mention right breast mass that is slightly larger but clinically assymptomatic   Given overall an improving ams/cough. And anticipated continued improvement of conditioning that will be slower, we can finish treatment for mild ambulatory pna. I will follow up in clinic in around 10 days to make sure the staph hominis is of no pathogenic activity. She'll need pcp f/u for breast mass and w/u of her chronic (since 08/2021) lymphocytic leukocytosis  Plan: Change cefdinir to cephalexin to finish 4 more days on 12/22 If continued  improvement can discharge from id standpoint I will see in clinic 12/28 @ 1030 to reevaluate Discussed with primary team   I spent 75 minute reviewing data/chart, and coordinating care and >50% direct face to face time providing counseling/discussing diagnostics/treatment plan with patient    ------------------------------------------------ Principal Problem:   Acute metabolic encephalopathy Active Problems:   Type 2 diabetes mellitus with diabetic neuropathy, unspecified ([place])   Hyperlipidemia associated with type 2 diabetes mellitus ([place])   Major depression, recurrent ([place])   Hypertension associated with diabetes ([place])   CKD (chronic kidney disease) stage 4, GFR 15-29 ml/min ([facility])   DNR (do not resuscitate)   Bronchitis   Stage 2 moderate COPD by GOLD classification ([place])   Mild dementia ([place])   UTI (urinary tract infection)   Leukocytosis   Breast mass in female    HPI: Jill [name] is a 88 y.o. female with copd, bilateral hip replacement disant past, dm2, stage 4 ckd, admitted 12/16 for continued AMS and cough, with admitting bcx staph hominis of unclear significance   Hx via sign out from primary team, chart review, discussion with patient and her niece who was available bedside   Patient with mild moderate dementia, independent, but relies on niece for cooking/finances. She has had one pna a year the past several years. Has copd per chart in setting hx smoking which she quit several years back  She deveveloped mostly dry cough about 2 weeks prior to this admission associated  with confusion and weakness and some subjective chill. She was seen in [place] 12/11 and a chest ct showed actually improved tree in bud nodular changes from prior chest ct a year ago. She was given doxy and advised to f/u pcp  Her ams/weakness didn't improve so pcp advise to be admitted on 12/15  Denies uti sx  She has had chronic lypmhocytic leukocytosis which increased this admission in setting of  a prednisone course for her bronchitis prior to admission  Bcx 12/15 staph hominis. No joint pain back pain. No indwelling catheter/cardiac device Ucx kleb pna not esbl   Afebrile here On room air Cough improving Still weak and mentation not baseline. Able to converse with me appropriately though  Given a dose cipro here but transitioned to cefdinir  Other incidental finding is worsening size right breast mass that is assypmtomatic    Family History  Problem Relation Age of Onset   COPD Brother    Heart disease Brother    Diabetes Brother    Lymphoma Brother    Alzheimer's disease Brother    Pancreatic cancer Sister    Alzheimer's disease Sister    Heart disease Mother    Breast cancer Sister    Leukemia Other    Kidney disease Neg Hx     Social History   Tobacco Use   Smoking status: Former    Packs/day: 1.00    Years: 45.00    Total pack years: 45.00    Types: Cigarettes    Quit date: 06/16/1999    Years since quitting: 23.1   Smokeless tobacco: Never  Substance Use Topics   Alcohol use: No    Alcohol/week: 0.0 standard drinks of alcohol   Drug use: No    Allergies  Allergen Reactions   Lovastatin Other (See Comments)    REACTION: leg pain   Rocephin [Ceftriaxone] Hives   Statins Other (See Comments)    REACTION: leg cramps, weakness   Sulfa Antibiotics Hives and Itching   Codeine Rash   Niacin Rash    Flushing also    Review of Systems: ROS All Other ROS was negative, except mentioned above   Past Medical History:  Diagnosis Date   Allergy    Arthritis    osteoarthritis    Chronic kidney disease    Complication of anesthesia    difficult waking    Depression    Diabetes mellitus    Diverticula, colon    Family history of anesthesia complication    Son is difficult intubation   Hyperlipidemia    Hypertension    Vasovagal syncope 2006   Negative cardiac workup-myoview, echo       Scheduled Meds:  allopurinol  50 mg Oral QODAY    cefdinir  300 mg Oral BID   docusate sodium  100 mg Oral BID   donepezil  10 mg Oral QHS   enoxaparin (LOVENOX) injection  30 mg Subcutaneous Q24H   escitalopram  20 mg Oral Daily   feeding supplement (GLUCERNA SHAKE)  237 mL Oral TID BM   insulin aspart  0-9 Units Subcutaneous TID WC   loratadine  10 mg Oral Daily   multivitamin with minerals  1 tablet Oral Daily   umeclidinium bromide  1 puff Inhalation Daily   Continuous Infusions:  lactated ringers 75 mL/hr at 08/08/22 1245   PRN Meds:.acetaminophen **OR** acetaminophen, albuterol, bisacodyl, calcium carbonate (dosed in mg elemental calcium), camphor-menthol **AND** hydrOXYzine, feeding supplement (NEPRO CARB STEADY), hydrALAZINE, hydrOXYzine, ondansetron (  ZOFRAN) IV, polyethylene glycol   OBJECTIVE: Blood pressure (!) 141/58, pulse 80, temperature 97.8 F (36.6 C), temperature source Oral, resp. rate 16, height '5\' 2"'$  (1.575 m), weight 76.2 kg, SpO2 94 %.  Physical Exam  General/constitutional: no distress, pleasant HEENT: Normocephalic, PER, Conj Clear, EOMI, Oropharynx clear Neck supple CV: rrr no mrg Lungs: coarse upper airway sound; normal respiratory effort Abd: Soft, Nontender Ext: no edema Skin: No Rash Neuro: nonfocal - generalized weakness MSK: no peripheral joint swelling/tenderness/warmth; back spines nontender     Lab Results Lab Results  Component Value Date   WBC 21.6 (H) 08/07/2022   HGB 11.9 (L) 08/07/2022   HCT 36.2 08/07/2022   MCV 92.8 08/07/2022   PLT 173 08/07/2022    Lab Results  Component Value Date   CREATININE 2.03 (H) 08/07/2022   BUN 59 (H) 08/07/2022   NA 136 08/07/2022   K 5.1 08/07/2022   CL 106 08/07/2022   CO2 22 08/07/2022    Lab Results  Component Value Date   ALT 13 08/06/2022   AST 14 (L) 08/06/2022   ALKPHOS 74 08/06/2022   BILITOT 0.5 08/06/2022      Microbiology: Recent Results (from the past 240 hour(s))  Resp panel by RT-PCR (RSV, Flu A&B, Covid)  Anterior Nasal Swab     Status: None   Collection Time: 08/01/22  3:55 PM   Specimen: Anterior Nasal Swab  Result Value Ref Range Status   SARS Coronavirus 2 by RT PCR NEGATIVE NEGATIVE Final    Comment: (NOTE) SARS-CoV-2 target nucleic acids are NOT DETECTED.  The SARS-CoV-2 RNA is generally detectable in upper respiratory specimens during the acute phase of infection. The lowest concentration of SARS-CoV-2 viral copies this assay can detect is 138 copies/mL. A negative result does not preclude SARS-Cov-2 infection and should not be used as the sole basis for treatment or other patient management decisions. A negative result may occur with  improper specimen collection/handling, submission of specimen other than nasopharyngeal swab, presence of viral mutation(s) within the areas targeted by this assay, and inadequate number of viral copies(<138 copies/mL). A negative result must be combined with clinical observations, patient history, and epidemiological information. The expected result is Negative.  Fact Sheet for Patients:  EntrepreneurPulse.com.au  Fact Sheet for Healthcare Providers:  IncredibleEmployment.be  This test is no t yet approved or cleared by the [place] FDA and  has been authorized for detection and/or diagnosis of SARS-CoV-2 by FDA under an Emergency Use Authorization (EUA). This EUA will remain  in effect (meaning this test can be used) for the duration of the COVID-19 declaration under Section 564(b)(1) of the Act, 21 U.S.C.section 360bbb-3(b)(1), unless the authorization is terminated  or revoked sooner.       Influenza A by PCR NEGATIVE NEGATIVE Final   Influenza B by PCR NEGATIVE NEGATIVE Final    Comment: (NOTE) The Xpert Xpress SARS-CoV-2/FLU/RSV plus assay is intended as an aid in the diagnosis of influenza from Nasopharyngeal swab specimens and should not be used as a sole basis for treatment. Nasal washings  and aspirates are unacceptable for Xpert Xpress SARS-CoV-2/FLU/RSV testing.  Fact Sheet for Patients: EntrepreneurPulse.com.au  Fact Sheet for Healthcare Providers: IncredibleEmployment.be  This test is not yet approved or cleared by the [place] FDA and has been authorized for detection and/or diagnosis of SARS-CoV-2 by FDA under an Emergency Use Authorization (EUA). This EUA will remain in effect (meaning this test can be used) for the duration of the  COVID-19 declaration under Section 564(b)(1) of the Act, 21 U.S.C. section 360bbb-3(b)(1), unless the authorization is terminated or revoked.     Resp Syncytial Virus by PCR NEGATIVE NEGATIVE Final    Comment: (NOTE) Fact Sheet for Patients: EntrepreneurPulse.com.au  Fact Sheet for Healthcare Providers: IncredibleEmployment.be  This test is not yet approved or cleared by the [place] FDA and has been authorized for detection and/or diagnosis of SARS-CoV-2 by FDA under an Emergency Use Authorization (EUA). This EUA will remain in effect (meaning this test can be used) for the duration of the COVID-19 declaration under Section 564(b)(1) of the Act, 21 U.S.C. section 360bbb-3(b)(1), unless the authorization is terminated or revoked.  Performed at [facility], [address] [place], [place] [address]   Urine Culture     Status: Abnormal   Collection Time: 08/01/22  7:42 PM   Specimen: Urine, Random  Result Value Ref Range Status   Specimen Description   Final    URINE, RANDOM Performed at [facility], [address] [place], [place] [address]    Special Requests   Final    NONE Performed at [facility], [address] [place], [place] [address]    Culture >=100,000 COLONIES/mL KLEBSIELLA PNEUMONIAE (A)  Final   Report Status 08/04/2022 FINAL  Final   Organism ID, Bacteria KLEBSIELLA PNEUMONIAE (A)  Final       Susceptibility   Klebsiella pneumoniae - MIC*    AMPICILLIN RESISTANT Resistant     CEFAZOLIN <=4 SENSITIVE Sensitive     CEFEPIME <=0.12 SENSITIVE Sensitive     CEFTRIAXONE <=0.25 SENSITIVE Sensitive     CIPROFLOXACIN <=0.25 SENSITIVE Sensitive     GENTAMICIN <=1 SENSITIVE Sensitive     IMIPENEM <=0.25 SENSITIVE Sensitive     NITROFURANTOIN 32 SENSITIVE Sensitive     TRIMETH/SULFA <=20 SENSITIVE Sensitive     AMPICILLIN/SULBACTAM 4 SENSITIVE Sensitive     PIP/TAZO <=4 SENSITIVE Sensitive     * >=100,000 COLONIES/mL KLEBSIELLA PNEUMONIAE  Resp panel by RT-PCR (RSV, Flu A&B, Covid) Anterior Nasal Swab     Status: None   Collection Time: 08/05/22  6:36 PM   Specimen: Anterior Nasal Swab  Result Value Ref Range Status   SARS Coronavirus 2 by RT PCR NEGATIVE NEGATIVE Final    Comment: (NOTE) SARS-CoV-2 target nucleic acids are NOT DETECTED.  The SARS-CoV-2 RNA is generally detectable in upper respiratory specimens during the acute phase of infection. The lowest concentration of SARS-CoV-2 viral copies this assay can detect is 138 copies/mL. A negative result does not preclude SARS-Cov-2 infection and should not be used as the sole basis for treatment or other patient management decisions. A negative result may occur with  improper specimen collection/handling, submission of specimen other than nasopharyngeal swab, presence of viral mutation(s) within the areas targeted by this assay, and inadequate number of viral copies(<138 copies/mL). A negative result must be combined with clinical observations, patient history, and epidemiological information. The expected result is Negative.  Fact Sheet for Patients:  EntrepreneurPulse.com.au  Fact Sheet for Healthcare Providers:  IncredibleEmployment.be  This test is no t yet approved or cleared by the [place] FDA and  has been authorized for detection and/or diagnosis of SARS-CoV-2 by FDA  under an Emergency Use Authorization (EUA). This EUA will remain  in effect (meaning this test can be used) for the duration of the COVID-19 declaration under Section 564(b)(1) of the Act, 21 U.S.C.section 360bbb-3(b)(1), unless the authorization is terminated  or revoked  sooner.       Influenza A by PCR NEGATIVE NEGATIVE Final   Influenza B by PCR NEGATIVE NEGATIVE Final    Comment: (NOTE) The Xpert Xpress SARS-CoV-2/FLU/RSV plus assay is intended as an aid in the diagnosis of influenza from Nasopharyngeal swab specimens and should not be used as a sole basis for treatment. Nasal washings and aspirates are unacceptable for Xpert Xpress SARS-CoV-2/FLU/RSV testing.  Fact Sheet for Patients: EntrepreneurPulse.com.au  Fact Sheet for Healthcare Providers: IncredibleEmployment.be  This test is not yet approved or cleared by the [place] FDA and has been authorized for detection and/or diagnosis of SARS-CoV-2 by FDA under an Emergency Use Authorization (EUA). This EUA will remain in effect (meaning this test can be used) for the duration of the COVID-19 declaration under Section 564(b)(1) of the Act, 21 U.S.C. section 360bbb-3(b)(1), unless the authorization is terminated or revoked.     Resp Syncytial Virus by PCR NEGATIVE NEGATIVE Final    Comment: (NOTE) Fact Sheet for Patients: EntrepreneurPulse.com.au  Fact Sheet for Healthcare Providers: IncredibleEmployment.be  This test is not yet approved or cleared by the [place] FDA and has been authorized for detection and/or diagnosis of SARS-CoV-2 by FDA under an Emergency Use Authorization (EUA). This EUA will remain in effect (meaning this test can be used) for the duration of the COVID-19 declaration under Section 564(b)(1) of the Act, 21 U.S.C. section 360bbb-3(b)(1), unless the authorization is terminated or revoked.  Performed at [facility], [address] [place], [place] [address]   Blood culture (routine x 2)     Status: Abnormal (Preliminary result)   Collection Time: 08/05/22 10:18 PM   Specimen: BLOOD  Result Value Ref Range Status   Specimen Description   Final    BLOOD LEFT ANTECUBITAL Performed at [facility], [address], [place], [place] [address]    Special Requests   Final    BOTTLES DRAWN AEROBIC AND ANAEROBIC Blood Culture adequate volume Performed at [facility], [address], [place], [place] [address]    Culture  Setup Time   Final    GRAM POSITIVE COCCI AEROBIC BOTTLE ONLY CRITICAL VALUE NOTED.  VALUE IS CONSISTENT WITH PREVIOUSLY REPORTED AND CALLED VALUE.    Culture (A)  Final    STAPHYLOCOCCUS HOMINIS SUSCEPTIBILITIES TO FOLLOW Performed at [facility], [place] [address], [place], [place] [address]    Report Status PENDING  Incomplete  Blood culture (routine x 2)     Status: Abnormal (Preliminary result)   Collection Time: 08/05/22 10:20 PM   Specimen: BLOOD  Result Value Ref Range Status   Specimen Description   Final    BLOOD RIGHT ANTECUBITAL Performed at [facility], [address] [place], [place] [address]    Special Requests   Final    BOTTLES DRAWN AEROBIC AND ANAEROBIC Blood Culture adequate volume Performed at [facility], [address] [place], [place] [address]    Culture  Setup Time   Final    AEROBIC BOTTLE ONLY GRAM POSITIVE COCCI IN CLUSTERS Organism ID to follow CRITICAL RESULT CALLED TO, READ BACK BY AND VERIFIED WITH:  C/ PHARMD M. BITONTI 08/07/22 2020 A. LAFRANCE    Culture (A)  Final    STAPHYLOCOCCUS HOMINIS SUSCEPTIBILITIES TO FOLLOW Performed at [facility], [place] [address], [place], [place] [address]    Report Status PENDING  Incomplete  Blood Culture ID Panel (Reflexed)     Status: Abnormal  Collection Time: 08/05/22 10:20 PM   Result Value Ref Range Status   Enterococcus faecalis NOT DETECTED NOT DETECTED Final   Enterococcus Faecium NOT DETECTED NOT DETECTED Final   Listeria monocytogenes NOT DETECTED NOT DETECTED Final   Staphylococcus species DETECTED (A) NOT DETECTED Final    Comment: CRITICAL RESULT CALLED TO, READ BACK BY AND VERIFIED WITH:  C/ PHARMD M. BITONTI 08/07/22 2020 A. LAFRANCE    Staphylococcus aureus (BCID) NOT DETECTED NOT DETECTED Final   Staphylococcus epidermidis NOT DETECTED NOT DETECTED Final   Staphylococcus lugdunensis NOT DETECTED NOT DETECTED Final   Streptococcus species NOT DETECTED NOT DETECTED Final   Streptococcus agalactiae NOT DETECTED NOT DETECTED Final   Streptococcus pneumoniae NOT DETECTED NOT DETECTED Final   Streptococcus pyogenes NOT DETECTED NOT DETECTED Final   A.calcoaceticus-baumannii NOT DETECTED NOT DETECTED Final   Bacteroides fragilis NOT DETECTED NOT DETECTED Final   Enterobacterales NOT DETECTED NOT DETECTED Final   Enterobacter cloacae complex NOT DETECTED NOT DETECTED Final   Escherichia coli NOT DETECTED NOT DETECTED Final   Klebsiella aerogenes NOT DETECTED NOT DETECTED Final   Klebsiella oxytoca NOT DETECTED NOT DETECTED Final   Klebsiella pneumoniae NOT DETECTED NOT DETECTED Final   Proteus species NOT DETECTED NOT DETECTED Final   Salmonella species NOT DETECTED NOT DETECTED Final   Serratia marcescens NOT DETECTED NOT DETECTED Final   Haemophilus influenzae NOT DETECTED NOT DETECTED Final   Neisseria meningitidis NOT DETECTED NOT DETECTED Final   Pseudomonas aeruginosa NOT DETECTED NOT DETECTED Final   Stenotrophomonas maltophilia NOT DETECTED NOT DETECTED Final   Candida albicans NOT DETECTED NOT DETECTED Final   Candida auris NOT DETECTED NOT DETECTED Final   Candida glabrata NOT DETECTED NOT DETECTED Final   Candida krusei NOT DETECTED NOT DETECTED Final   Candida parapsilosis NOT DETECTED NOT DETECTED Final   Candida tropicalis NOT  DETECTED NOT DETECTED Final   Cryptococcus neoformans/gattii NOT DETECTED NOT DETECTED Final    Comment: Performed at [facility], [address], [place], [place] [address]  Urine Culture     Status: None   Collection Time: 08/05/22 11:00 PM   Specimen: In/Out Cath Urine  Result Value Ref Range Status   Specimen Description   Final    IN/OUT CATH URINE Performed at [facility], [address] [place], [place] [address]    Special Requests   Final    NONE Performed at [facility], [address] [place], [place] [address]    Culture   Final    NO GROWTH Performed at [facility], [place] [address], [place], [place] [address]    Report Status 08/07/2022 FINAL  Final     Serology:    Imaging: If present, new imagings (plain films, ct scans, and mri) have been personally visualized and interpreted; radiology reports have been reviewed. Decision making incorporated into the Impression / Recommendations.  12/11 cxr 1. Airway thickening is present, suggesting bronchitis or reactive airways disease. 2. Mild tortuosity of the thoracic aorta.  12/11 chest ct COMPARISON:  Chest radiograph dated 08/01/2022, CT chest dated 09/02/2021, 05/31/2018 CT abdomen pelvis dated 11/06/2021   FINDINGS: Cardiovascular: Normal heart size. No significant pericardial fluid/thickening. Coronary artery calcifications. Aortic atherosclerosis. Great vessels are normal in course and caliber.   Mediastinum/Nodes: Partially imaged thyroid gland without nodules meeting criteria for imaging follow-up by size. Small hiatal hernia. No pathologically enlarged axillary, supraclavicular, mediastinal, or hilar lymph nodes.   Lungs/Pleura: The central airways  are patent. Secretions in the trachea with subsegmental mucous plugging in the right middle lobe (3:97) with mild diffuse bronchial wall thickening. No focal consolidation. Interval resolution of  many of the ground-glass and tree-in-bud nodules noted on prior examination. A few remain, for example along the anterior left upper lobe (3:37) measuring 11 x 6 mm. A few additional nodules are unchanged dating back to 05/31/2018, for example 5 mm right lower lobe (3:99), 3 mm right middle lobe (3:97), 4 mm left lower lobe (3:119, and 5 mm right lower lobe (3:126), likely benign. Calcified right upper lobe granuloma. No pneumothorax. No pleural effusion.   Upper abdomen: Colonic diverticulosis without acute diverticulitis. Atrophic kidneys. Bilateral exophytic mildly hyperattenuating renal foci, better evaluated on prior CT abdomen and pelvis dated 11/06/2021.   Musculoskeletal: No acute or abnormal lytic or blastic osseous lesions. Slight increase in size of ovoid lesion in the upper right breast at approximately 12 o'clock measuring 2.3 x 1.3 cm, previously 1.9 x 1.4 cm on 09/02/2021 and new compared to 05/31/2018.   IMPRESSION: 1. Interval resolution of many of the ground-glass and tree-in-bud nodules noted on prior examination. A few remain, likely infectious/inflammatory. Non-contrast chest CT at 3-6 months is recommended. If the nodules are stable at time of repeat CT, then future CT at 18-24 months (from today's scan) is considered optional for low-risk patients, but is recommended for high-risk patients. This recommendation follows the consensus statement: Guidelines for Management of Incidental Pulmonary Nodules Detected on CT Images: From the Fleischner Society 2017; Radiology 2017; 284:228-243. 2. Slight increase in size of ovoid lesion in the upper right breast at approximately 12 o'clock measuring 2.3 x 1.3 cm, previously 1.9 x 1.4 cm on 09/02/2021 and new compared to 05/31/2018. Recommend correlation with mammographic evaluation. 3. Coronary artery calcifications  12/15 chest xray IMPRESSION: 1. Airway thickening is present, suggesting bronchitis or  reactive airways disease. 2. Mild tortuosity of the thoracic aorta.    Jabier Mutton, [facility] [place] [phone] pager    08/08/2022, 3:35 PM

## 2022-08-08 NOTE — Evaluation (Signed)
Speech Language Pathology Evaluation Patient Details Name: Jill [name] MRN: [id number] DOB: 01/08/1932 Today's Date: 08/08/2022 Time: 5329-9242 SLP Time Calculation (min) (ACUTE ONLY): 14 min  Problem List:  Patient Active Problem List   Diagnosis Date Noted   UTI (urinary tract infection) 08/06/2022   Leukocytosis 08/06/2022   Breast mass in female 68/34/1962   Acute metabolic encephalopathy 22/97/9892   C. difficile colitis 03/22/2022   Cough 09/02/2021   History of heart failure 09/02/2021   Shortness of breath 09/02/2021   History of COPD 09/02/2021   Pelvic fracture ([place]) 04/07/2021   Frequent falls 04/07/2021   Situational anxiety 04/07/2021   Chronic gout without tophus 04/02/2021   Subdural hematoma ([place]) 01/19/2021   Recurrent Clostridioides difficile diarrhea 11/14/2020   Acute pain of right knee 09/29/2020   Mild dementia ([place]) 07/03/2020   Acute pain of right shoulder 07/03/2020   Bilateral leg weakness 05/21/2019   Pulmonary nodule 05/29/2018   Urinary urgency 02/03/2017   Bilateral buttock pain 02/02/2017   Gout involving toe of right foot 12/23/2016   Candidal intertrigo 10/28/2016   Stage 2 moderate COPD by GOLD classification ([place]) 09/13/2016   Type 2 diabetes mellitus with stage 3 chronic kidney disease, without long-term current use of insulin ([place]) 08/08/2016   Chronic diastolic congestive heart failure ([place]) 08/08/2016   Intracranial bleed ([facility])    BPPV (benign paroxysmal positional vertigo) 06/24/2016   Accidental fall 06/24/2016   Chronic neck pain 03/18/2016   Neuropathy due to type 2 diabetes mellitus ([place]) 03/18/2016   Anemia in chronic kidney disease 12/15/2015   Incomplete bladder emptying 10/15/2015   Abscess, renal/perirenal 10/15/2015   Bronchitis 06/02/2015   Lumbar back pain with radiculopathy affecting left lower extremity 05/05/2015   Lesion of left native kidney 01/28/2015   Chronic fatigue 01/22/2015   Chronic idiopathic  urticaria 01/17/2015   S/P hip replacement 05/16/2014   DNR (do not resuscitate) 02/20/2014   ALLERGIC RHINITIS 12/17/2008   Vitamin D deficiency 07/01/2008   CKD (chronic kidney disease) stage 4, GFR 15-29 ml/min ([place]) 02/11/2008   DIVERTICULOSIS OF COLON 02/01/2008   COLONIC POLYPS, ADENOMATOUS 10/19/2006   Type 2 diabetes mellitus with diabetic neuropathy, unspecified ([place]) 10/19/2006   Hyperlipidemia associated with type 2 diabetes mellitus ([place]) 10/19/2006   OBESITY, NOS 10/19/2006   Major depression, recurrent ([place]) 10/19/2006   Hypertension associated with diabetes ([place]) 10/19/2006   Past Medical History:  Past Medical History:  Diagnosis Date   Allergy    Arthritis    osteoarthritis    Chronic kidney disease    Complication of anesthesia    difficult waking    Depression    Diabetes mellitus    Diverticula, colon    Family history of anesthesia complication    Son is difficult intubation   Hyperlipidemia    Hypertension    Vasovagal syncope 2006   Negative cardiac workup-myoview, echo   Past Surgical History:  Past Surgical History:  Procedure Laterality Date   ABDOMINAL HYSTERECTOMY  1977   fibroid   JOINT REPLACEMENT  2009   rt hip   TOTAL HIP ARTHROPLASTY Left 05/12/2014   dr Mayer Camel   TOTAL HIP ARTHROPLASTY Left 05/12/2014   Procedure: TOTAL HIP ARTHROPLASTY;  Surgeon: Kerin Salen, MD;  Location: [facility];  Service: Orthopedics;  Laterality: Left;   HPI:  88yo female admitted 08/05/22 with confusion, acute metabolic encephalopathy- dx UTI and pna. Pt had bronchitis last week, and it worsened. PMH: DM, HTN, dementia, depression,  COPD, CKD4, HLD, bronchitis   Assessment / Plan / Recommendation Clinical Impression  Pt presents with changes in memory and orientation as a result of encephalopathy.  She verbalizes that she feels she is functioning at a 5/10 with regard to her mental clarity- her granddaughter, [name], at the bedside, agrees. She demonstrates  impaired recall of recent events and disorientation to elements of time and place.  Speech is clear; language is intact. No formalized SLP intervention is warranted - pt will have the support she needs at home.  Our service will sign off.    SLP Assessment  SLP Recommendation/Assessment: Patient does not need any further Speech [facility] SLP Visit Diagnosis: Cognitive communication deficit (R41.841)    Recommendations for follow up therapy are one component of a multi-disciplinary discharge planning process, led by the attending physician.  Recommendations may be updated based on patient status, additional functional criteria and insurance authorization.    Follow Up Recommendations  No SLP follow up    Assistance Recommended at Discharge   Intermittent supervision/assistance                 SLP Evaluation Cognition  Overall Cognitive Status: Impaired/Different from baseline Arousal/Alertness: Awake/alert Orientation Level: Oriented to person;Disoriented to place;Oriented to time Attention: Selective Selective Attention: Appears intact Memory: Impaired Memory Impairment: Retrieval deficit Awareness: Appears intact Problem Solving: Impaired       Comprehension  Auditory Comprehension Overall Auditory Comprehension: Appears within functional limits for tasks assessed Visual Recognition/Discrimination Discrimination: Not tested Reading Comprehension Reading Status: Not tested    Expression Expression Primary Mode of Expression: Verbal Verbal Expression Overall Verbal Expression: Appears within functional limits for tasks assessed Written Expression Dominant Hand: Right Written Expression: Not tested   Oral / Motor  Oral Motor/Sensory Function Overall Oral Motor/Sensory Function: Within functional limits Motor Speech Overall Motor Speech: Appears within functional limits for tasks assessed            Juan Quam Laurice 08/08/2022, 2:09  PM Noe Goyer L. Tivis Ringer, MA CCC/SLP Clinical Specialist - [place] Office number [phone]

## 2022-08-08 NOTE — Progress Notes (Signed)
Mobility Specialist Progress Note:   08/08/22 1042  Mobility  Activity Dangled on edge of bed  Level of Assistance Minimal assist, patient does 75% or more  Activity Response Tolerated fair  Mobility Referral Yes  $Mobility charge 1 Mobility   Pt received in bed and agreeable. Session limited by pt's fatigue. Pt left in bed with all needs met, call bell in reach, family in room, and bed alarm on.   Andrey Campanile Mobility Specialist Please contact via [employer] or  Rehab office at [phone]

## 2022-08-08 NOTE — Plan of Care (Signed)
  Problem: Education: Goal: Knowledge of General Education information will improve Description: Including pain rating scale, medication(s)/side effects and non-pharmacologic comfort measures Outcome: Progressing   Problem: Clinical Measurements: Goal: Will remain free from infection Outcome: Progressing Goal: Respiratory complications will improve Outcome: Progressing   Problem: Activity: Goal: Risk for activity intolerance will decrease Outcome: Progressing   Problem: Nutrition: Goal: Adequate nutrition will be maintained Outcome: Progressing   Problem: Pain Managment: Goal: General experience of comfort will improve Outcome: Progressing

## 2022-08-08 NOTE — Progress Notes (Signed)
Physical Therapy Treatment Patient Details Name: Jill [name] MRN: [id number] DOB: 20-Jan-1932 Today's Date: 08/08/2022   History of Present Illness Jill [name] is a 88 y.o. female  presenting with AMS; to ED earlier this week and diagnosed with UTI, PNA and sent home; returned to ED when pt began having hallucinations; with medical history significant of DM, HTN, dementia, COPD, stage 4 CKD, and HLD    PT Comments    PT focusing session on room-distance gait and repeated transfer training for building activity tolerance. Pt overall requiring min-mod physical assist for all mobility, pt struggles most with transitioning from sit>stand and stand>sit given fear of falling. Pt in/out of bed twice this session due to pt needing to have BM, PT assisting with clean up on bsc. PT to continue to follow acutely.     Recommendations for follow up therapy are one component of a multi-disciplinary discharge planning process, led by the attending physician.  Recommendations may be updated based on patient status, additional functional criteria and insurance authorization.  Follow Up Recommendations  Home health PT     Assistance Recommended at Discharge Intermittent Supervision/Assistance  Patient can return home with the following A little help with walking and/or transfers;A little help with bathing/dressing/bathroom   Equipment Recommendations  BSC/3in1    Recommendations for Other Services       Precautions / Restrictions Precautions Precautions: Fall Restrictions Weight Bearing Restrictions: No     Mobility  Bed Mobility Overal bed mobility: Needs Assistance Bed Mobility: Supine to Sit, Sit to Supine     Supine to sit: Mod assist Sit to supine: Mod assist   General bed mobility comments: assist for trunk and LE management    Transfers Overall transfer level: Needs assistance Equipment used: Rolling walker (2 wheels) Transfers: Sit to/from Stand Sit to Stand: Min  assist Stand pivot transfers: Min assist         General transfer comment: assist for power up and steadying upon standing, STS x4, from EOB x2 and BSC x1 and chair in room x1.    Ambulation/Gait Ambulation/Gait assistance: Min assist Gait Distance (Feet): 10 Feet (+10) Assistive device: Rolling walker (2 wheels) Gait Pattern/deviations: Step-through pattern, Decreased stride length, Trunk flexed Gait velocity: decr     General Gait Details: assist to steady and guide RW, cues for upright posture, placement in RW   Stairs             Wheelchair Mobility    Modified Rankin (Stroke Patients Only)       Balance Overall balance assessment: Needs assistance Sitting-balance support: No upper extremity supported, Feet supported Sitting balance-Leahy Scale: Fair     Standing balance support: Bilateral upper extremity supported, During functional activity Standing balance-Leahy Scale: Poor Standing balance comment: reliant on RW                            Cognition Arousal/Alertness: Awake/alert Behavior During Therapy: WFL for tasks assessed/performed Overall Cognitive Status: History of cognitive impairments - at baseline                                          Exercises      General Comments        Pertinent Vitals/Pain Pain Assessment Pain Assessment: Faces Faces Pain Scale: Hurts a little bit Pain Location: generalized Pain  Descriptors / Indicators: Discomfort, Guarding Pain Intervention(s): Limited activity within patient's tolerance, Monitored during session, Repositioned    Home Living     Available Help at Discharge: Family Type of Home: House                  Prior Function            PT Goals (current goals can now be found in the care plan section) Acute Rehab PT Goals Patient Stated Goal: Did not state, agreeable to getting up PT Goal Formulation: With patient/family Time For Goal Achievement:  08/21/22 Potential to Achieve Goals: Good Progress towards PT goals: Progressing toward goals    Frequency    Min 3X/week      PT Plan Current plan remains appropriate    Co-evaluation              AM-PAC PT "6 Clicks" Mobility   Outcome Measure  Help needed turning from your back to your side while in a flat bed without using bedrails?: A Little Help needed moving from lying on your back to sitting on the side of a flat bed without using bedrails?: A Little Help needed moving to and from a bed to a chair (including a wheelchair)?: A Little Help needed standing up from a chair using your arms (e.g., wheelchair or bedside chair)?: A Little Help needed to walk in hospital room?: A Lot Help needed climbing 3-5 steps with a railing? : A Lot 6 Click Score: 16    End of Session   Activity Tolerance: Patient tolerated treatment well Patient left: with family/visitor present;in bed;with bed alarm set;with call bell/phone within reach Nurse Communication: Mobility status (needs a new purewick) PT Visit Diagnosis: Unsteadiness on feet (R26.81);Other abnormalities of gait and mobility (R26.89);Muscle weakness (generalized) (M62.81)     Time: 8657-8469 PT Time Calculation (min) (ACUTE ONLY): 41 min  Charges:  $Gait Training: 8-22 mins $Therapeutic Activity: 23-37 mins                     Stacie Glaze, PT DPT Acute Rehabilitation Services Pager [phone]  Office [phone]   [place] 08/08/2022, 5:16 PM

## 2022-08-09 ENCOUNTER — Other Ambulatory Visit (HOSPITAL_COMMUNITY): Payer: Self-pay

## 2022-08-09 DIAGNOSIS — J181 Lobar pneumonia, unspecified organism: Secondary | ICD-10-CM | POA: Diagnosis not present

## 2022-08-09 DIAGNOSIS — G9341 Metabolic encephalopathy: Secondary | ICD-10-CM | POA: Diagnosis not present

## 2022-08-09 LAB — CULTURE, BLOOD (ROUTINE X 2)
Special Requests: ADEQUATE
Special Requests: ADEQUATE

## 2022-08-09 LAB — CBC
HCT: 34.1 % — ABNORMAL LOW (ref 36.0–46.0)
Hemoglobin: 10.9 g/dL — ABNORMAL LOW (ref 12.0–15.0)
MCH: 30.3 pg (ref 26.0–34.0)
MCHC: 32 g/dL (ref 30.0–36.0)
MCV: 94.7 fL (ref 80.0–100.0)
Platelets: 132 10*3/uL — ABNORMAL LOW (ref 150–400)
RBC: 3.6 MIL/uL — ABNORMAL LOW (ref 3.87–5.11)
RDW: 13.3 % (ref 11.5–15.5)
WBC: 16.9 10*3/uL — ABNORMAL HIGH (ref 4.0–10.5)
nRBC: 0 % (ref 0.0–0.2)

## 2022-08-09 LAB — BASIC METABOLIC PANEL
Anion gap: 7 (ref 5–15)
BUN: 51 mg/dL — ABNORMAL HIGH (ref 8–23)
CO2: 25 mmol/L (ref 22–32)
Calcium: 9.2 mg/dL (ref 8.9–10.3)
Chloride: 104 mmol/L (ref 98–111)
Creatinine, Ser: 1.97 mg/dL — ABNORMAL HIGH (ref 0.44–1.00)
GFR, Estimated: 24 mL/min — ABNORMAL LOW (ref >60)
Glucose, Bld: 197 mg/dL — ABNORMAL HIGH (ref 70–99)
Potassium: 5.1 mmol/L (ref 3.5–5.1)
Sodium: 136 mmol/L (ref 135–145)

## 2022-08-09 LAB — PATHOLOGIST SMEAR REVIEW

## 2022-08-09 LAB — GLUCOSE, CAPILLARY: Glucose-Capillary: 156 mg/dL — ABNORMAL HIGH (ref 70–99)

## 2022-08-09 LAB — PHOSPHORUS: Phosphorus: 4.4 mg/dL (ref 2.5–4.6)

## 2022-08-09 LAB — MAGNESIUM: Magnesium: 2 mg/dL (ref 1.7–2.4)

## 2022-08-09 MED ORDER — CEPHALEXIN 500 MG PO CAPS
500.0000 mg | ORAL_CAPSULE | Freq: Three times a day (TID) | ORAL | 0 refills | Status: AC
  Filled 2022-08-09: qty 12, 4d supply, fill #0

## 2022-08-09 NOTE — Progress Notes (Signed)
Mobility Specialist Progress Note:   08/09/22 1053  Mobility  Activity Ambulated with assistance in hallway  Level of Assistance Minimal assist, patient does 75% or more  Assistive Device Front wheel walker  Distance Ambulated (ft) 50 ft  Activity Response Tolerated well  $Mobility charge 1 Mobility   Pt received in bed willing to participate in mobility. No complaints of pain. MinA to stand then contact guard throughout ambulation. Left in bed with call bell in reach and all needs met.   Gareth Eagle [place] K[place]ley Mobility Specialist Please contact via [employer] or  Rehab Office at [phone]

## 2022-08-09 NOTE — Care Management Important Message (Signed)
Important Message  Patient Details  Name: Jill [name] MRN: [id number] Date of Birth: Apr 15, 1932   Medicare Important Message Given:  Yes  Patient left prior to IM delivery will mail the IM to the patient home address.    Fawzi Melman 08/09/2022, 2:47 PM

## 2022-08-09 NOTE — Progress Notes (Addendum)
Subjective Patient feeling better today yet Less cough No f/c  Exam: Vitals:   08/09/22 0757 08/09/22 0848  BP: (!) 155/85   Pulse: 74   Resp:    Temp: 97.8 F (36.6 C)   SpO2: 93% 94%   Well appearing, conversant Heent: normocephalic; per; conj clear Neck supple Cv: rrr no mrg pulM: normal resp effort Abd: soft/nt Ext: no edema Skin no rash   A/p Sepsis Pna CoNS bacteremia of unclear significance  Finish pna course of treatment as discussed Ok to discharge from id standpoint Id f/u as per 12/18 id note to f/u on significance of CoNS infection Suspect with her dementia and the ct chest changes she might have silent aspiration and could benefit from outpatient speech evaluation  I spent more than 35 minute reviewing data/chart, and coordinating care and >50% direct face to face time providing counseling/discussing diagnostics/treatment plan with patient

## 2022-08-09 NOTE — Progress Notes (Signed)
IV infiltrated with LR running. [place] MD notified.

## 2022-08-09 NOTE — Discharge Summary (Signed)
Physician Discharge Summary  Jill [name] QPY:[id number] DOB: [birth year] DOA: 08/05/2022  PCP: Jinny Sanders, MD  Admit date: 08/05/2022  Discharge date: 08/09/2022  Admitted From: Home.  Disposition:  Home Health services.  Recommendations for Outpatient Follow-up:  Follow up with PCP in 1-2 weeks. Please obtain BMP/CBC in one week. Advised to take Keflex 500 mg 3 times daily for 4 more days for UTI. Advised to follow-up with infectious diseases Dr. Elba Barman on December 28 as scheduled. Will request PCP to refer this patient to heme-onc for lymphocytosis.  Home Health:Home PT/OT Equipment/Devices: None  Discharge Condition: Stable CODE STATUS:DNR Diet recommendation: Heart Healthy   Brief Summary/ Hospital Course: This 88 yrs old female with PMH significant for DM, HTN, dementia, COPD, stage 4 CKD, and HLD presenting with confusion. Her daughter reports that she had bronchitis last week and it got worse. Her granddaughter took her to the ER  on Monday at [facility].  She was diagnosed with PNA and UTI. She was discharged home on antibiotics. She was having hallucinations and so they brought her back in the ED.  She is not coughing as much but they think the UTI is the real problem.  Patient is admitted for further evaluation. [facility] ED visit: She was treated with prednisone and doxycycline for bronchitis.  Chest x-ray was negative but CT chest shows resolving groundglass and three in one bud nodules likely infectious or inflammatory.  Also found to have a breast mass which was new as compared to the prior imaging.  UA was abnormal.  Patient was given Rocephin and was discharged on cefdinir.  Urine culture sensitive to cefdinir. Patient was continued on IV antibiotics and was given supportive care with IV hydration.  Patient has made significant improvement.  Mental status back to baseline.  Blood cultures growing Staph hominis of unclear significance.  Infectious disease was consulted recommended  to continue Keflex for 4 more days to complete treatment for aspiration pneumonia.  PT and OT recommended home health services.  Patient is being discharged home with home health services arranged.   Discharge Diagnoses:  Principal Problem:   Acute metabolic encephalopathy Active Problems:   Type 2 diabetes mellitus with diabetic neuropathy, unspecified (HCC)   Hyperlipidemia associated with type 2 diabetes mellitus (HCC)   Major depression, recurrent (HCC)   Hypertension associated with diabetes ([place])   CKD (chronic kidney disease) stage 4, GFR 15-29 ml/min (HCC)   DNR (do not resuscitate)   Altered mental status   Bronchitis   Stage 2 moderate COPD by GOLD classification ([place])   Lobar pneumonia, unspecified organism ([place])   Mild dementia ([place])   UTI (urinary tract infection)   Leukocytosis   Breast mass in female   Acute metabolic Encephalopathy: Patient has baseline dementia. Likely multifactorial could be urinary tract infection, pneumonia. Blood cultures grew Staph hominis of unclear significance. She was recently treated for bronchitis. She was also recently treated for Klebsiella UTI that was resistant only to Ampicillin. Continue cefdinir for UTI.   Bronchitis: She was recently treated with prednisone and doxycycline.   UTI: She was appropriately treated for recent Klebsiella UTI. UA appears improved. Continue with Cefdinir without further treatment Renal CT showed indeterminate renal lesions;  She is recommended to have a f/u renal MRI when her mental status improve.   Leucocytosis. She has Persistent leukocytosis since 08/2021 Predominantly lymphocytosis Consider referral to heme-onc on discharge.   Breast Mass: Appears to be new and enlarging Outpatient PCP follow up.  Dementia / depression: Continue Aricept, hydroxyzine,Lexapro. Continue Delirium precautions.   CKD stage IV Appears to be stable at this time Avoid nephrotoxic medications.    Diabetes Mellitus : HbA1c was 7.1 on 02/15/22 Continue sliding scale.   Essential HTN Continue Home medications.   COPD Continue Spiriva, albuterol.  Discharge Instructions  Discharge Instructions     Call MD for:  persistant dizziness or light-headedness   Complete by: As directed    Call MD for:  persistant nausea and vomiting   Complete by: As directed    Call MD for:  redness, tenderness, or signs of infection (pain, swelling, redness, odor or green/yellow discharge around incision site)   Complete by: As directed    Diet - low sodium heart healthy   Complete by: As directed    Diet Carb Modified   Complete by: As directed    Discharge instructions   Complete by: As directed    Advised to follow-up with primary care physician in 1 week. Advised to take Keflex 500 mg 3 times daily for 4 more days for UTI. Advised to follow-up with infectious diseases Dr. Elba Barman on December 28 as scheduled.   Increase activity slowly   Complete by: As directed       Allergies as of 08/09/2022       Reactions   Lovastatin Other (See Comments)   REACTION: leg pain   Rocephin [ceftriaxone] Hives   Statins Other (See Comments)   REACTION: leg cramps, weakness   Sulfa Antibiotics Hives, Itching   Codeine Rash   Niacin Rash   Flushing also        Medication List     STOP taking these medications    cefdinir 300 MG capsule Commonly known as: OMNICEF       TAKE these medications    acetaminophen 500 MG tablet Commonly known as: TYLENOL Take 1,000 mg by mouth every 6 (six) hours as needed for moderate pain or headache.   albuterol 108 (90 Base) MCG/ACT inhaler Commonly known as: VENTOLIN HFA Inhale 2 puffs into the lungs every 6 (six) hours as needed for wheezing or shortness of breath.   allopurinol 100 MG tablet Commonly known as: ZYLOPRIM TAKE 1/2 TABLET BY MOUTH EVERY OTHER DAY   cephALEXin 500 MG capsule Commonly known as: KEFLEX Take 1 capsule (500 mg total) by  mouth 3 (three) times daily for 4 days.   Cranberry 500 MG Caps Take 1 capsule by mouth daily.   diclofenac Sodium 1 % Gel Commonly known as: VOLTAREN Apply 2 g topically 4 (four) times daily as needed (For pain).   donepezil 10 MG tablet Commonly known as: ARICEPT TAKE 1 TABLET BY MOUTH EVERYDAY AT BEDTIME What changed: See the new instructions.   escitalopram 20 MG tablet Commonly known as: LEXAPRO TAKE 1 TABLET BY MOUTH EVERY DAY   estradiol 0.1 MG/GM vaginal cream Commonly known as: ESTRACE VAGINAL Apply 0.'5mg'$  (pea-sized amount)  just inside the vaginal introitus with a finger-tip on Monday, Wednesday and Friday nights. What changed:  how much to take how to take this when to take this additional instructions   hydrOXYzine 10 MG tablet Commonly known as: ATARAX TAKE 0.5-1 TABLETS (5-10 MG TOTAL) BY MOUTH DAILY AS NEEDED FOR ANXIETY.   loratadine 10 MG tablet Commonly known as: CLARITIN Take 10 mg by mouth daily.   multivitamin with minerals tablet Take 1 tablet by mouth daily.   PROBIOTIC DAILY PO Take 1 tablet by mouth daily.  Spiriva HandiHaler 18 MCG inhalation capsule Generic drug: tiotropium INHALE 1 CAPSULE VIA HANDIHALER ONCE DAILY AT THE SAME TIME EVERY DAY What changed:  how much to take how to take this when to take this additional instructions   TART CHERRY PO Take 1 tablet by mouth in the morning and at bedtime.   Vitamin D (Ergocalciferol) 1.25 MG (50000 UNIT) Caps capsule Commonly known as: DRISDOL TAKE 1 CAPSULE (50,000 UNITS TOTAL) BY MOUTH EVERY FRIDAY. What changed: when to take this        Follow-up Information     [facility] Follow up.   Specialty: [place] Why: [facility] will be calling you in the next 1-2 days to set up home health services for PT/OT. Contact information: [address] [place] [address] [phone]         Jinny Sanders, MD Follow up.   Specialty:  Family Medicine Why: Please call and schedule a hospital follow up in the next 7-10 days at the clinic. Contact information: [place] [address] [phone]         Prudencio Pair T, MD Follow up in 1 week(s).   Specialty: Infectious Diseases Contact information: [address] [place] [address] [phone]                Allergies  Allergen Reactions   Lovastatin Other (See Comments)    REACTION: leg pain   Rocephin [Ceftriaxone] Hives   Statins Other (See Comments)    REACTION: leg cramps, weakness   Sulfa Antibiotics Hives and Itching   Codeine Rash   Niacin Rash    Flushing also    Consultations: Infectious Diseases.   Procedures/Studies: CT Head Wo Contrast  Result Date: 08/05/2022 CLINICAL DATA:  Delirium EXAM: CT HEAD WITHOUT CONTRAST TECHNIQUE: Contiguous axial images were obtained from the base of the skull through the vertex without intravenous contrast. RADIATION DOSE REDUCTION: This exam was performed according to the departmental dose-optimization program which includes automated exposure control, adjustment of the mA and/or kV according to patient size and/or use of iterative reconstruction technique. COMPARISON:  02/26/2021 FINDINGS: Brain: No evidence of acute infarction, hemorrhage, hydrocephalus, extra-axial collection or mass lesion/mass effect. Global cortical atrophy.  Secondary tracheal ir prominence. Extensive subcortical white matter and periventricular small vessel ischemic changes. Vascular: Intracranial atherosclerosis. Skull: Normal. Negative for fracture or focal lesion. Sinuses/Orbits: The visualized paranasal sinuses are essentially clear. The mastoid air cells are unopacified. Other: None. IMPRESSION: No evidence of acute intracranial abnormality. Atrophy with small vessel ischemic changes. Electronically Signed   By: Julian Hy M.D.   On: 08/05/2022 20:59   DG Chest 2 View  Result Date:  08/05/2022 CLINICAL DATA:  Altered mental status, cough EXAM: CHEST - 2 VIEW COMPARISON:  08/01/2022 chest CT and radiograph FINDINGS: Mild central airway thickening. The lungs appear otherwise clear on conventional radiography. Heart size within normal limits. Mild tortuosity of the thoracic aorta. No significant bony abnormality observed. No blunting of the costophrenic angles. IMPRESSION: 1. Airway thickening is present, suggesting bronchitis or reactive airways disease. 2. Mild tortuosity of the thoracic aorta. Electronically Signed   By: Van Clines M.D.   On: 08/05/2022 18:03   CT Renal Stone Study  Result Date: 08/01/2022 CLINICAL DATA:  CKD, UTI, leukocytosis, dementia EXAM: CT ABDOMEN AND PELVIS WITHOUT CONTRAST TECHNIQUE: Multidetector CT imaging of the abdomen and pelvis was performed following the standard protocol without  IV contrast. RADIATION DOSE REDUCTION: This exam was performed according to the departmental dose-optimization program which includes automated exposure control, adjustment of the mA and/or kV according to patient size and/or use of iterative reconstruction technique. COMPARISON:  CT abdomen pelvis 11/06/2021 FINDINGS: Limited evaluation due to motion artifact. Lower chest: Trace pericardial effusion.  No acute abnormality. Hepatobiliary: No focal liver abnormality. No gallstones, gallbladder wall thickening, or pericholecystic fluid. No biliary dilatation. Pancreas: Diffusely atrophic. No focal lesion. Otherwise normal pancreatic contour. No surrounding inflammatory changes. No main pancreatic ductal dilatation. Spleen: Normal in size without focal abnormality. Adrenals/Urinary Tract: No adrenal nodule bilaterally. Limited evaluation of the kidneys due to motion artifact. No nephrolithiasis and no hydronephrosis. Hyperdense lesions within the right kidney with a density of 40-60 Hounsfield units. No ureterolithiasis or hydroureter. The urinary bladder is partially  visualized with streak artifact from femoral hardware limiting evaluation. Stomach/Bowel: Stomach is within normal limits. No evidence of bowel wall thickening or dilatation. Colonic diverticulosis. Appendix appears normal. Vascular/Lymphatic: No abdominal aorta or iliac aneurysm. Severe atherosclerotic plaque of the aorta and its branches. No abdominal, pelvic, or inguinal lymphadenopathy. Reproductive: Likely hysterectomy with nonvisualization due to streak artifact originating from bilateral femoral surgical hardware. Other: No intraperitoneal free fluid. No intraperitoneal free gas. No organized fluid collection. Musculoskeletal: No abdominal wall hernia or abnormality. No suspicious lytic or blastic osseous lesions. No acute displaced fracture. Multilevel degenerative changes of the spine. Total bilateral hip arthroplasties partially visualized. IMPRESSION: 1. Limited evaluation due to motion and streak artifact originating from bilateral femoral surgical hardware. Limited evaluation on this noncontrast study. 2. Colonic diverticulosis with no acute diverticulitis. 3. Indeterminate right renal lesions. When the patient is clinically stable and able to follow directions and hold their breath (preferably as an outpatient) further evaluation with dedicated outpatient MRI renal protocol should be considered. 4.  Aortic Atherosclerosis (ICD10-I70.0). Electronically Signed   By: Iven Finn M.D.   On: 08/01/2022 20:36   CT Chest Wo Contrast  Result Date: 08/01/2022 CLINICAL DATA:  Two-week history of cough EXAM: CT CHEST WITHOUT CONTRAST TECHNIQUE: Multidetector CT imaging of the chest was performed following the standard protocol without IV contrast. RADIATION DOSE REDUCTION: This exam was performed according to the departmental dose-optimization program which includes automated exposure control, adjustment of the mA and/or kV according to patient size and/or use of iterative reconstruction technique.  COMPARISON:  Chest radiograph dated 08/01/2022, CT chest dated 09/02/2021, 05/31/2018 CT abdomen pelvis dated 11/06/2021 FINDINGS: Cardiovascular: Normal heart size. No significant pericardial fluid/thickening. Coronary artery calcifications. Aortic atherosclerosis. Great vessels are normal in course and caliber. Mediastinum/Nodes: Partially imaged thyroid gland without nodules meeting criteria for imaging follow-up by size. Small hiatal hernia. No pathologically enlarged axillary, supraclavicular, mediastinal, or hilar lymph nodes. Lungs/Pleura: The central airways are patent. Secretions in the trachea with subsegmental mucous plugging in the right middle lobe (3:97) with mild diffuse bronchial wall thickening. No focal consolidation. Interval resolution of many of the ground-glass and tree-in-bud nodules noted on prior examination. A few remain, for example along the anterior left upper lobe (3:37) measuring 11 x 6 mm. A few additional nodules are unchanged dating back to 05/31/2018, for example 5 mm right lower lobe (3:99), 3 mm right middle lobe (3:97), 4 mm left lower lobe (3:119, and 5 mm right lower lobe (3:126), likely benign. Calcified right upper lobe granuloma. No pneumothorax. No pleural effusion. Upper abdomen: Colonic diverticulosis without acute diverticulitis. Atrophic kidneys. Bilateral exophytic mildly hyperattenuating renal foci, better evaluated on prior  CT abdomen and pelvis dated 11/06/2021. Musculoskeletal: No acute or abnormal lytic or blastic osseous lesions. Slight increase in size of ovoid lesion in the upper right breast at approximately 12 o'clock measuring 2.3 x 1.3 cm, previously 1.9 x 1.4 cm on 09/02/2021 and new compared to 05/31/2018. IMPRESSION: 1. Interval resolution of many of the ground-glass and tree-in-bud nodules noted on prior examination. A few remain, likely infectious/inflammatory. Non-contrast chest CT at 3-6 months is recommended. If the nodules are stable at time of  repeat CT, then future CT at 18-24 months (from today's scan) is considered optional for low-risk patients, but is recommended for high-risk patients. This recommendation follows the consensus statement: Guidelines for Management of Incidental Pulmonary Nodules Detected on CT Images: From the Fleischner Society 2017; Radiology 2017; 284:228-243. 2. Slight increase in size of ovoid lesion in the upper right breast at approximately 12 o'clock measuring 2.3 x 1.3 cm, previously 1.9 x 1.4 cm on 09/02/2021 and new compared to 05/31/2018. Recommend correlation with mammographic evaluation. 3. Coronary artery calcifications. Aortic Atherosclerosis (ICD10-I70.0). Electronically Signed   By: Darrin Nipper M.D.   On: 08/01/2022 16:56   DG Chest Port 1 View  Result Date: 08/01/2022 CLINICAL DATA:  Cough, weakness. EXAM: PORTABLE CHEST 1 VIEW COMPARISON:  September 01, 2021. FINDINGS: The heart size and mediastinal contours are within normal limits. Both lungs are clear. The visualized skeletal structures are unremarkable. IMPRESSION: No active disease. Electronically Signed   By: Marijo Conception M.D.   On: 08/01/2022 15:10     Subjective: Seen and examined at bedside.  Overnight events noted.  Patient reported doing better,  Patient wants to be discharged.  Patient is being discharged home.  Discharge Exam: Vitals:   08/09/22 0757 08/09/22 0848  BP: (!) 155/85   Pulse: 74   Resp:    Temp: 97.8 F (36.6 C)   SpO2: 93% 94%   Vitals:   08/09/22 0432 08/09/22 0606 08/09/22 0757 08/09/22 0848  BP: (!) 180/83 (!) 171/65 (!) 155/85   Pulse: 73  74   Resp: 17     Temp: 98.4 F (36.9 C)  97.8 F (36.6 C)   TempSrc:   Oral   SpO2: 95%  93% 94%  Weight:      Height:        General: Pt is alert, awake, not in acute distress Cardiovascular: RRR, S1/S2 +, no rubs, no gallops Respiratory: CTA bilaterally, no wheezing, no rhonchi Abdominal: Soft, NT, ND, bowel sounds + Extremities: no edema, no  cyanosis    The results of significant diagnostics from this hospitalization (including imaging, microbiology, ancillary and laboratory) are listed below for reference.     Microbiology: Recent Results (from the past 240 hour(s))  Resp panel by RT-PCR (RSV, Flu A&B, Covid) Anterior Nasal Swab     Status: None   Collection Time: 08/01/22  3:55 PM   Specimen: Anterior Nasal Swab  Result Value Ref Range Status   SARS Coronavirus 2 by RT PCR NEGATIVE NEGATIVE Final    Comment: (NOTE) SARS-CoV-2 target nucleic acids are NOT DETECTED.  The SARS-CoV-2 RNA is generally detectable in upper respiratory specimens during the acute phase of infection. The lowest concentration of SARS-CoV-2 viral copies this assay can detect is 138 copies/mL. A negative result does not preclude SARS-Cov-2 infection and should not be used as the sole basis for treatment or other patient management decisions. A negative result may occur with  improper specimen collection/handling, submission of specimen other  than nasopharyngeal swab, presence of viral mutation(s) within the areas targeted by this assay, and inadequate number of viral copies(<138 copies/mL). A negative result must be combined with clinical observations, patient history, and epidemiological information. The expected result is Negative.  Fact Sheet for Patients:  EntrepreneurPulse.com.au  Fact Sheet for Healthcare Providers:  IncredibleEmployment.be  This test is no t yet approved or cleared by the [place] FDA and  has been authorized for detection and/or diagnosis of SARS-CoV-2 by FDA under an Emergency Use Authorization (EUA). This EUA will remain  in effect (meaning this test can be used) for the duration of the COVID-19 declaration under Section 564(b)(1) of the Act, 21 U.S.C.section 360bbb-3(b)(1), unless the authorization is terminated  or revoked sooner.       Influenza A by PCR NEGATIVE  NEGATIVE Final   Influenza B by PCR NEGATIVE NEGATIVE Final    Comment: (NOTE) The Xpert Xpress SARS-CoV-2/FLU/RSV plus assay is intended as an aid in the diagnosis of influenza from Nasopharyngeal swab specimens and should not be used as a sole basis for treatment. Nasal washings and aspirates are unacceptable for Xpert Xpress SARS-CoV-2/FLU/RSV testing.  Fact Sheet for Patients: EntrepreneurPulse.com.au  Fact Sheet for Healthcare Providers: IncredibleEmployment.be  This test is not yet approved or cleared by the [place] FDA and has been authorized for detection and/or diagnosis of SARS-CoV-2 by FDA under an Emergency Use Authorization (EUA). This EUA will remain in effect (meaning this test can be used) for the duration of the COVID-19 declaration under Section 564(b)(1) of the Act, 21 U.S.C. section 360bbb-3(b)(1), unless the authorization is terminated or revoked.     Resp Syncytial Virus by PCR NEGATIVE NEGATIVE Final    Comment: (NOTE) Fact Sheet for Patients: EntrepreneurPulse.com.au  Fact Sheet for Healthcare Providers: IncredibleEmployment.be  This test is not yet approved or cleared by the [place] FDA and has been authorized for detection and/or diagnosis of SARS-CoV-2 by FDA under an Emergency Use Authorization (EUA). This EUA will remain in effect (meaning this test can be used) for the duration of the COVID-19 declaration under Section 564(b)(1) of the Act, 21 U.S.C. section 360bbb-3(b)(1), unless the authorization is terminated or revoked.  Performed at [facility], [address] [place], [place] [address]   Urine Culture     Status: Abnormal   Collection Time: 08/01/22  7:42 PM   Specimen: Urine, Random  Result Value Ref Range Status   Specimen Description   Final    URINE, RANDOM Performed at [facility], [address] [place], [place]  [address]    Special Requests   Final    NONE Performed at [facility], [address] [place], [place] [address]    Culture >=100,000 COLONIES/mL KLEBSIELLA PNEUMONIAE (A)  Final   Report Status 08/04/2022 FINAL  Final   Organism ID, Bacteria KLEBSIELLA PNEUMONIAE (A)  Final      Susceptibility   Klebsiella pneumoniae - MIC*    AMPICILLIN RESISTANT Resistant     CEFAZOLIN <=4 SENSITIVE Sensitive     CEFEPIME <=0.12 SENSITIVE Sensitive     CEFTRIAXONE <=0.25 SENSITIVE Sensitive     CIPROFLOXACIN <=0.25 SENSITIVE Sensitive     GENTAMICIN <=1 SENSITIVE Sensitive     IMIPENEM <=0.25 SENSITIVE Sensitive     NITROFURANTOIN 32 SENSITIVE Sensitive     TRIMETH/SULFA <=20 SENSITIVE Sensitive     AMPICILLIN/SULBACTAM 4 SENSITIVE Sensitive     PIP/TAZO <=4 SENSITIVE Sensitive     * >=100,000 COLONIES/mL  KLEBSIELLA PNEUMONIAE  Resp panel by RT-PCR (RSV, Flu A&B, Covid) Anterior Nasal Swab     Status: None   Collection Time: 08/05/22  6:36 PM   Specimen: Anterior Nasal Swab  Result Value Ref Range Status   SARS Coronavirus 2 by RT PCR NEGATIVE NEGATIVE Final    Comment: (NOTE) SARS-CoV-2 target nucleic acids are NOT DETECTED.  The SARS-CoV-2 RNA is generally detectable in upper respiratory specimens during the acute phase of infection. The lowest concentration of SARS-CoV-2 viral copies this assay can detect is 138 copies/mL. A negative result does not preclude SARS-Cov-2 infection and should not be used as the sole basis for treatment or other patient management decisions. A negative result may occur with  improper specimen collection/handling, submission of specimen other than nasopharyngeal swab, presence of viral mutation(s) within the areas targeted by this assay, and inadequate number of viral copies(<138 copies/mL). A negative result must be combined with clinical observations, patient history, and epidemiological information. The expected result is Negative.  Fact Sheet  for Patients:  EntrepreneurPulse.com.au  Fact Sheet for Healthcare Providers:  IncredibleEmployment.be  This test is no t yet approved or cleared by the [place] FDA and  has been authorized for detection and/or diagnosis of SARS-CoV-2 by FDA under an Emergency Use Authorization (EUA). This EUA will remain  in effect (meaning this test can be used) for the duration of the COVID-19 declaration under Section 564(b)(1) of the Act, 21 U.S.C.section 360bbb-3(b)(1), unless the authorization is terminated  or revoked sooner.       Influenza A by PCR NEGATIVE NEGATIVE Final   Influenza B by PCR NEGATIVE NEGATIVE Final    Comment: (NOTE) The Xpert Xpress SARS-CoV-2/FLU/RSV plus assay is intended as an aid in the diagnosis of influenza from Nasopharyngeal swab specimens and should not be used as a sole basis for treatment. Nasal washings and aspirates are unacceptable for Xpert Xpress SARS-CoV-2/FLU/RSV testing.  Fact Sheet for Patients: EntrepreneurPulse.com.au  Fact Sheet for Healthcare Providers: IncredibleEmployment.be  This test is not yet approved or cleared by the [place] FDA and has been authorized for detection and/or diagnosis of SARS-CoV-2 by FDA under an Emergency Use Authorization (EUA). This EUA will remain in effect (meaning this test can be used) for the duration of the COVID-19 declaration under Section 564(b)(1) of the Act, 21 U.S.C. section 360bbb-3(b)(1), unless the authorization is terminated or revoked.     Resp Syncytial Virus by PCR NEGATIVE NEGATIVE Final    Comment: (NOTE) Fact Sheet for Patients: EntrepreneurPulse.com.au  Fact Sheet for Healthcare Providers: IncredibleEmployment.be  This test is not yet approved or cleared by the [place] FDA and has been authorized for detection and/or diagnosis of SARS-CoV-2 by FDA under an  Emergency Use Authorization (EUA). This EUA will remain in effect (meaning this test can be used) for the duration of the COVID-19 declaration under Section 564(b)(1) of the Act, 21 U.S.C. section 360bbb-3(b)(1), unless the authorization is terminated or revoked.  Performed at [facility], [address] [place], [place] [address]   Blood culture (routine x 2)     Status: Abnormal   Collection Time: 08/05/22 10:18 PM   Specimen: BLOOD  Result Value Ref Range Status   Specimen Description   Final    BLOOD LEFT ANTECUBITAL Performed at [facility], [address] [place], [place] [address]    Special Requests   Final    BOTTLES DRAWN AEROBIC AND ANAEROBIC Blood Culture adequate volume Performed at [facility], [address], [place], [place] [address]    Culture  Setup Time   Final    GRAM POSITIVE COCCI AEROBIC BOTTLE ONLY CRITICAL VALUE NOTED.  VALUE IS CONSISTENT WITH PREVIOUSLY REPORTED AND CALLED VALUE. Performed at [facility], [place] [address] [place], [place] [address]    Culture STAPHYLOCOCCUS HOMINIS (A)  Final   Report Status 08/09/2022 FINAL  Final   Organism ID, Bacteria STAPHYLOCOCCUS HOMINIS  Final      Susceptibility   Staphylococcus hominis - MIC*    CIPROFLOXACIN >=8 RESISTANT Resistant     ERYTHROMYCIN >=8 RESISTANT Resistant     GENTAMICIN 1 SENSITIVE Sensitive     OXACILLIN >=4 RESISTANT Resistant     TETRACYCLINE >=16 RESISTANT Resistant     VANCOMYCIN 2 SENSITIVE Sensitive     TRIMETH/SULFA 40 SENSITIVE Sensitive     CLINDAMYCIN RESISTANT Resistant     RIFAMPIN <=0.5 SENSITIVE Sensitive     Inducible Clindamycin POSITIVE Resistant     * STAPHYLOCOCCUS HOMINIS  Blood culture (routine x 2)     Status: Abnormal   Collection Time: 08/05/22 10:20 PM   Specimen: BLOOD  Result Value Ref Range Status   Specimen Description   Final    BLOOD RIGHT ANTECUBITAL Performed at [facility], [address], [place], [place] [address]    Special Requests   Final    BOTTLES DRAWN AEROBIC AND ANAEROBIC Blood Culture adequate volume Performed at [facility], [address], [place],  [address]    Culture  Setup Time   Final    AEROBIC BOTTLE ONLY GRAM POSITIVE COCCI IN CLUSTERS Organism ID to follow CRITICAL RESULT CALLED TO, READ BACK BY AND VERIFIED WITH:  C/ PHARMD M. BITONTI 08/07/22 2020 A. LAFRANCE Performed at [facility], [place] [address] [place], [place] [address]    Culture STAPHYLOCOCCUS HOMINIS (A)  Final   Report Status 08/09/2022 FINAL  Final   Organism ID, Bacteria STAPHYLOCOCCUS HOMINIS  Final      Susceptibility   Staphylococcus hominis - MIC*    CIPROFLOXACIN >=8 RESISTANT Resistant     ERYTHROMYCIN >=8 RESISTANT Resistant     GENTAMICIN 1 SENSITIVE Sensitive     OXACILLIN >=4 RESISTANT Resistant     TETRACYCLINE >=16 RESISTANT Resistant     VANCOMYCIN 4 SENSITIVE Sensitive     TRIMETH/SULFA 40 SENSITIVE Sensitive     CLINDAMYCIN RESISTANT Resistant     RIFAMPIN <=0.5 SENSITIVE Sensitive     Inducible Clindamycin POSITIVE Resistant     * STAPHYLOCOCCUS HOMINIS  Blood Culture ID Panel (Reflexed)     Status: Abnormal   Collection Time: 08/05/22 10:20 PM  Result Value Ref Range Status   Enterococcus faecalis NOT DETECTED NOT DETECTED Final   Enterococcus Faecium NOT DETECTED NOT DETECTED Final   Listeria monocytogenes NOT DETECTED NOT DETECTED Final   Staphylococcus species DETECTED (A) NOT DETECTED Final    Comment: CRITICAL RESULT CALLED TO, READ BACK BY AND VERIFIED WITH:  C/ PHARMD M. BITONTI 08/07/22 2020 A. LAFRANCE    Staphylococcus aureus (BCID) NOT DETECTED NOT DETECTED Final   Staphylococcus epidermidis NOT DETECTED NOT DETECTED Final   Staphylococcus lugdunensis NOT DETECTED NOT DETECTED Final   Streptococcus species NOT DETECTED NOT DETECTED Final   Streptococcus agalactiae NOT  DETECTED NOT DETECTED Final   Streptococcus pneumoniae NOT DETECTED NOT DETECTED Final   Streptococcus pyogenes NOT DETECTED NOT DETECTED Final   A.calcoaceticus-baumannii NOT DETECTED NOT DETECTED  Final   Bacteroides fragilis NOT DETECTED NOT DETECTED Final   Enterobacterales NOT DETECTED NOT DETECTED Final   Enterobacter cloacae complex NOT DETECTED NOT DETECTED Final   Escherichia coli NOT DETECTED NOT DETECTED Final   Klebsiella aerogenes NOT DETECTED NOT DETECTED Final   Klebsiella oxytoca NOT DETECTED NOT DETECTED Final   Klebsiella pneumoniae NOT DETECTED NOT DETECTED Final   Proteus species NOT DETECTED NOT DETECTED Final   Salmonella species NOT DETECTED NOT DETECTED Final   Serratia marcescens NOT DETECTED NOT DETECTED Final   Haemophilus influenzae NOT DETECTED NOT DETECTED Final   Neisseria meningitidis NOT DETECTED NOT DETECTED Final   Pseudomonas aeruginosa NOT DETECTED NOT DETECTED Final   Stenotrophomonas maltophilia NOT DETECTED NOT DETECTED Final   Candida albicans NOT DETECTED NOT DETECTED Final   Candida auris NOT DETECTED NOT DETECTED Final   Candida glabrata NOT DETECTED NOT DETECTED Final   Candida krusei NOT DETECTED NOT DETECTED Final   Candida parapsilosis NOT DETECTED NOT DETECTED Final   Candida tropicalis NOT DETECTED NOT DETECTED Final   Cryptococcus neoformans/gattii NOT DETECTED NOT DETECTED Final    Comment: Performed at [facility], [place] [address] [place], [place] [address]  Urine Culture     Status: None   Collection Time: 08/05/22 11:00 PM   Specimen: In/Out Cath Urine  Result Value Ref Range Status   Specimen Description   Final    IN/OUT CATH URINE Performed at [facility], [address] [place], [place] [address]    Special Requests   Final    NONE Performed at [facility], [address] [place], [place] [address]    Culture   Final    NO GROWTH Performed at [facility],  [place] [address] [place], [place] [address]    Report Status 08/07/2022 FINAL  Final     Labs: BNP (last 3 results) No results for input(s): "BNP" in the last 8760 hours. Basic Metabolic Panel: Recent Labs  Lab 08/05/22 1836 08/06/22 0438 08/07/22 0210 08/09/22 0255  NA 137 140 136 136  K 4.4 4.5 5.1 5.1  CL 101 108 106 104  CO2 '22 22 22 25  '$ GLUCOSE 284* 164* 191* 197*  BUN 67* 60* 59* 51*  CREATININE 2.06* 2.07* 2.03* 1.97*  CALCIUM 9.6 8.7* 9.0 9.2  MG  --  2.0  --  2.0  PHOS  --   --   --  4.4   Liver Function Tests: Recent Labs  Lab 08/05/22 1836 08/06/22 0438  AST 11* 14*  ALT 11 13  ALKPHOS 93 74  BILITOT 0.5 0.5  PROT 6.9 5.4*  ALBUMIN 3.9 2.9*   No results for input(s): "LIPASE", "AMYLASE" in the last 168 hours. Recent Labs  Lab 08/05/22 2225  AMMONIA 23   CBC: Recent Labs  Lab 08/05/22 1836 08/06/22 0438 08/07/22 0210 08/09/22 0255  WBC 31.4* 23.2* 21.6* 16.9*  NEUTROABS  --  8.8*  --   --   HGB 13.4 11.9* 11.9* 10.9*  HCT 41.6 37.3 36.2 34.1*  MCV 93.1 93.3 92.8 94.7  PLT 245 188 173 132*   Cardiac Enzymes: No results for input(s): "CKTOTAL", "CKMB", "CKMBINDEX", "TROPONINI" in the last 168 hours. BNP: Invalid input(s): "POCBNP" CBG: Recent Labs  Lab 08/08/22 0820 08/08/22 1147 08/08/22 1710 08/08/22 2016 08/09/22 0735  GLUCAP 163* 274* 128* 192* 156*   D-Dimer No results for input(s): "DDIMER" in the last 72 hours. Hgb A1c No results for  input(s): "HGBA1C" in the last 72 hours. Lipid Profile No results for input(s): "CHOL", "HDL", "LDLCALC", "TRIG", "CHOLHDL", "LDLDIRECT" in the last 72 hours. Thyroid function studies No results for input(s): "TSH", "T4TOTAL", "T3FREE", "THYROIDAB" in the last 72 hours.  Invalid input(s): "FREET3" Anemia work up No results for input(s): "VITAMINB12", "FOLATE", "FERRITIN", "TIBC", "IRON", "RETICCTPCT" in the last 72 hours. Urinalysis    Component Value Date/Time   COLORURINE YELLOW 08/05/2022  2300   APPEARANCEUR CLEAR 08/05/2022 2300   APPEARANCEUR Hazy (A) 02/16/2021 1549   LABSPEC 1.017 08/05/2022 2300   PHURINE 5.0 08/05/2022 2300   GLUCOSEU NEGATIVE 08/05/2022 2300   HGBUR NEGATIVE 08/05/2022 2300   BILIRUBINUR NEGATIVE 08/05/2022 2300   BILIRUBINUR Negative 02/16/2021 1549   KETONESUR NEGATIVE 08/05/2022 2300   PROTEINUR 30 (A) 08/05/2022 2300   UROBILINOGEN 0.2 11/09/2020 1834   NITRITE NEGATIVE 08/05/2022 2300   LEUKOCYTESUR NEGATIVE 08/05/2022 2300   Sepsis Labs Recent Labs  Lab 08/05/22 1836 08/06/22 0438 08/07/22 0210 08/09/22 0255  WBC 31.4* 23.2* 21.6* 16.9*   Microbiology Recent Results (from the past 240 hour(s))  Resp panel by RT-PCR (RSV, Flu A&B, Covid) Anterior Nasal Swab     Status: None   Collection Time: 08/01/22  3:55 PM   Specimen: Anterior Nasal Swab  Result Value Ref Range Status   SARS Coronavirus 2 by RT PCR NEGATIVE NEGATIVE Final    Comment: (NOTE) SARS-CoV-2 target nucleic acids are NOT DETECTED.  The SARS-CoV-2 RNA is generally detectable in upper respiratory specimens during the acute phase of infection. The lowest concentration of SARS-CoV-2 viral copies this assay can detect is 138 copies/mL. A negative result does not preclude SARS-Cov-2 infection and should not be used as the sole basis for treatment or other patient management decisions. A negative result may occur with  improper specimen collection/handling, submission of specimen other than nasopharyngeal swab, presence of viral mutation(s) within the areas targeted by this assay, and inadequate number of viral copies(<138 copies/mL). A negative result must be combined with clinical observations, patient history, and epidemiological information. The expected result is Negative.  Fact Sheet for Patients:  EntrepreneurPulse.com.au  Fact Sheet for Healthcare Providers:  IncredibleEmployment.be  This test is no t yet approved or  cleared by the [place] FDA and  has been authorized for detection and/or diagnosis of SARS-CoV-2 by FDA under an Emergency Use Authorization (EUA). This EUA will remain  in effect (meaning this test can be used) for the duration of the COVID-19 declaration under Section 564(b)(1) of the Act, 21 U.S.C.section 360bbb-3(b)(1), unless the authorization is terminated  or revoked sooner.       Influenza A by PCR NEGATIVE NEGATIVE Final   Influenza B by PCR NEGATIVE NEGATIVE Final    Comment: (NOTE) The Xpert Xpress SARS-CoV-2/FLU/RSV plus assay is intended as an aid in the diagnosis of influenza from Nasopharyngeal swab specimens and should not be used as a sole basis for treatment. Nasal washings and aspirates are unacceptable for Xpert Xpress SARS-CoV-2/FLU/RSV testing.  Fact Sheet for Patients: EntrepreneurPulse.com.au  Fact Sheet for Healthcare Providers: IncredibleEmployment.be  This test is not yet approved or cleared by the [place] FDA and has been authorized for detection and/or diagnosis of SARS-CoV-2 by FDA under an Emergency Use Authorization (EUA). This EUA will remain in effect (meaning this test can be used) for the duration of the COVID-19 declaration under Section 564(b)(1) of the Act, 21 U.S.C. section 360bbb-3(b)(1), unless the authorization is terminated or revoked.  Resp Syncytial Virus by PCR NEGATIVE NEGATIVE Final    Comment: (NOTE) Fact Sheet for Patients: EntrepreneurPulse.com.au  Fact Sheet for Healthcare Providers: IncredibleEmployment.be  This test is not yet approved or cleared by the [place] FDA and has been authorized for detection and/or diagnosis of SARS-CoV-2 by FDA under an Emergency Use Authorization (EUA). This EUA will remain in effect (meaning this test can be used) for the duration of the COVID-19 declaration under Section 564(b)(1) of the Act, 21  U.S.C. section 360bbb-3(b)(1), unless the authorization is terminated or revoked.  Performed at [facility], [address] [place], [place] [address]   Urine Culture     Status: Abnormal   Collection Time: 08/01/22  7:42 PM   Specimen: Urine, Random  Result Value Ref Range Status   Specimen Description   Final    URINE, RANDOM Performed at [facility], [address] [place], [place] [address]    Special Requests   Final    NONE Performed at [facility], [address] [place], [place] [address]    Culture >=100,000 COLONIES/mL KLEBSIELLA PNEUMONIAE (A)  Final   Report Status 08/04/2022 FINAL  Final   Organism ID, Bacteria KLEBSIELLA PNEUMONIAE (A)  Final      Susceptibility   Klebsiella pneumoniae - MIC*    AMPICILLIN RESISTANT Resistant     CEFAZOLIN <=4 SENSITIVE Sensitive     CEFEPIME <=0.12 SENSITIVE Sensitive     CEFTRIAXONE <=0.25 SENSITIVE Sensitive     CIPROFLOXACIN <=0.25 SENSITIVE Sensitive     GENTAMICIN <=1 SENSITIVE Sensitive     IMIPENEM <=0.25 SENSITIVE Sensitive     NITROFURANTOIN 32 SENSITIVE Sensitive     TRIMETH/SULFA <=20 SENSITIVE Sensitive     AMPICILLIN/SULBACTAM 4 SENSITIVE Sensitive     PIP/TAZO <=4 SENSITIVE Sensitive     * >=100,000 COLONIES/mL KLEBSIELLA PNEUMONIAE  Resp panel by RT-PCR (RSV, Flu A&B, Covid) Anterior Nasal Swab     Status: None   Collection Time: 08/05/22  6:36 PM   Specimen: Anterior Nasal Swab  Result Value Ref Range Status   SARS Coronavirus 2 by RT PCR NEGATIVE NEGATIVE Final    Comment: (NOTE) SARS-CoV-2 target nucleic acids are NOT DETECTED.  The SARS-CoV-2 RNA is generally detectable in upper respiratory specimens during the acute phase of infection. The lowest concentration of SARS-CoV-2 viral copies this assay can detect is 138 copies/mL. A negative result does not preclude SARS-Cov-2 infection and should not be used as the sole basis for treatment or other patient management  decisions. A negative result may occur with  improper specimen collection/handling, submission of specimen other than nasopharyngeal swab, presence of viral mutation(s) within the areas targeted by this assay, and inadequate number of viral copies(<138 copies/mL). A negative result must be combined with clinical observations, patient history, and epidemiological information. The expected result is Negative.  Fact Sheet for Patients:  EntrepreneurPulse.com.au  Fact Sheet for Healthcare Providers:  IncredibleEmployment.be  This test is no t yet approved or cleared by the [place] FDA and  has been authorized for detection and/or diagnosis of SARS-CoV-2 by FDA under an Emergency Use Authorization (EUA). This EUA will remain  in effect (meaning this test can be used) for the duration of the COVID-19 declaration under Section 564(b)(1) of the Act, 21 U.S.C.section 360bbb-3(b)(1), unless the authorization is terminated  or revoked sooner.       Influenza A by PCR NEGATIVE NEGATIVE Final   Influenza B by PCR NEGATIVE NEGATIVE Final  Comment: (NOTE) The Xpert Xpress SARS-CoV-2/FLU/RSV plus assay is intended as an aid in the diagnosis of influenza from Nasopharyngeal swab specimens and should not be used as a sole basis for treatment. Nasal washings and aspirates are unacceptable for Xpert Xpress SARS-CoV-2/FLU/RSV testing.  Fact Sheet for Patients: EntrepreneurPulse.com.au  Fact Sheet for Healthcare Providers: IncredibleEmployment.be  This test is not yet approved or cleared by the [place] FDA and has been authorized for detection and/or diagnosis of SARS-CoV-2 by FDA under an Emergency Use Authorization (EUA). This EUA will remain in effect (meaning this test can be used) for the duration of the COVID-19 declaration under Section 564(b)(1) of the Act, 21 U.S.C. section 360bbb-3(b)(1), unless the  authorization is terminated or revoked.     Resp Syncytial Virus by PCR NEGATIVE NEGATIVE Final    Comment: (NOTE) Fact Sheet for Patients: EntrepreneurPulse.com.au  Fact Sheet for Healthcare Providers: IncredibleEmployment.be  This test is not yet approved or cleared by the [place] FDA and has been authorized for detection and/or diagnosis of SARS-CoV-2 by FDA under an Emergency Use Authorization (EUA). This EUA will remain in effect (meaning this test can be used) for the duration of the COVID-19 declaration under Section 564(b)(1) of the Act, 21 U.S.C. section 360bbb-3(b)(1), unless the authorization is terminated or revoked.  Performed at [facility], [address], [place], [place] [address]   Blood culture (routine x 2)     Status: Abnormal   Collection Time: 08/05/22 10:18 PM   Specimen: BLOOD  Result Value Ref Range Status   Specimen Description   Final    BLOOD LEFT ANTECUBITAL Performed at [facility], [address], [place], [place] [address]    Special Requests   Final    BOTTLES DRAWN AEROBIC AND ANAEROBIC Blood Culture adequate volume Performed at [facility], [address] [place], [place] [address]    Culture  Setup Time   Final    GRAM POSITIVE COCCI AEROBIC BOTTLE ONLY CRITICAL VALUE NOTED.  VALUE IS CONSISTENT WITH PREVIOUSLY REPORTED AND CALLED VALUE. Performed at [facility], [place] [address], [place], [place] [address]    Culture STAPHYLOCOCCUS HOMINIS (A)  Final   Report Status 08/09/2022 FINAL  Final   Organism ID, Bacteria STAPHYLOCOCCUS HOMINIS  Final      Susceptibility   Staphylococcus hominis - MIC*    CIPROFLOXACIN >=8 RESISTANT Resistant     ERYTHROMYCIN >=8 RESISTANT Resistant     GENTAMICIN 1 SENSITIVE Sensitive     OXACILLIN >=4 RESISTANT Resistant     TETRACYCLINE >=16 RESISTANT Resistant     VANCOMYCIN 2 SENSITIVE  Sensitive     TRIMETH/SULFA 40 SENSITIVE Sensitive     CLINDAMYCIN RESISTANT Resistant     RIFAMPIN <=0.5 SENSITIVE Sensitive     Inducible Clindamycin POSITIVE Resistant     * STAPHYLOCOCCUS HOMINIS  Blood culture (routine x 2)     Status: Abnormal   Collection Time: 08/05/22 10:20 PM   Specimen: BLOOD  Result Value Ref Range Status   Specimen Description   Final    BLOOD RIGHT ANTECUBITAL Performed at [facility], [address], [place], [place] [address]    Special Requests   Final    BOTTLES DRAWN AEROBIC AND ANAEROBIC Blood Culture adequate volume Performed at [facility], [address], [place], [place] [address]    Culture  Setup Time   Final    AEROBIC BOTTLE ONLY GRAM POSITIVE COCCI IN CLUSTERS Organism  ID to follow CRITICAL RESULT CALLED TO, READ BACK BY AND VERIFIED WITH:  C/ PHARMD M. BITONTI 08/07/22 2020 A. LAFRANCE Performed at [facility], [place] [address], [place], [place] [address]    Culture STAPHYLOCOCCUS HOMINIS (A)  Final   Report Status 08/09/2022 FINAL  Final   Organism ID, Bacteria STAPHYLOCOCCUS HOMINIS  Final      Susceptibility   Staphylococcus hominis - MIC*    CIPROFLOXACIN >=8 RESISTANT Resistant     ERYTHROMYCIN >=8 RESISTANT Resistant     GENTAMICIN 1 SENSITIVE Sensitive     OXACILLIN >=4 RESISTANT Resistant     TETRACYCLINE >=16 RESISTANT Resistant     VANCOMYCIN 4 SENSITIVE Sensitive     TRIMETH/SULFA 40 SENSITIVE Sensitive     CLINDAMYCIN RESISTANT Resistant     RIFAMPIN <=0.5 SENSITIVE Sensitive     Inducible Clindamycin POSITIVE Resistant     * STAPHYLOCOCCUS HOMINIS  Blood Culture ID Panel (Reflexed)     Status: Abnormal   Collection Time: 08/05/22 10:20 PM  Result Value Ref Range Status   Enterococcus faecalis NOT DETECTED NOT DETECTED Final   Enterococcus Faecium NOT DETECTED NOT DETECTED Final   Listeria monocytogenes NOT DETECTED NOT DETECTED Final   Staphylococcus species  DETECTED (A) NOT DETECTED Final    Comment: CRITICAL RESULT CALLED TO, READ BACK BY AND VERIFIED WITH:  C/ PHARMD M. BITONTI 08/07/22 2020 A. LAFRANCE    Staphylococcus aureus (BCID) NOT DETECTED NOT DETECTED Final   Staphylococcus epidermidis NOT DETECTED NOT DETECTED Final   Staphylococcus lugdunensis NOT DETECTED NOT DETECTED Final   Streptococcus species NOT DETECTED NOT DETECTED Final   Streptococcus agalactiae NOT DETECTED NOT DETECTED Final   Streptococcus pneumoniae NOT DETECTED NOT DETECTED Final   Streptococcus pyogenes NOT DETECTED NOT DETECTED Final   A.calcoaceticus-baumannii NOT DETECTED NOT DETECTED Final   Bacteroides fragilis NOT DETECTED NOT DETECTED Final   Enterobacterales NOT DETECTED NOT DETECTED Final   Enterobacter cloacae complex NOT DETECTED NOT DETECTED Final   Escherichia coli NOT DETECTED NOT DETECTED Final   Klebsiella aerogenes NOT DETECTED NOT DETECTED Final   Klebsiella oxytoca NOT DETECTED NOT DETECTED Final   Klebsiella pneumoniae NOT DETECTED NOT DETECTED Final   Proteus species NOT DETECTED NOT DETECTED Final   Salmonella species NOT DETECTED NOT DETECTED Final   Serratia marcescens NOT DETECTED NOT DETECTED Final   Haemophilus influenzae NOT DETECTED NOT DETECTED Final   Neisseria meningitidis NOT DETECTED NOT DETECTED Final   Pseudomonas aeruginosa NOT DETECTED NOT DETECTED Final   Stenotrophomonas maltophilia NOT DETECTED NOT DETECTED Final   Candida albicans NOT DETECTED NOT DETECTED Final   Candida auris NOT DETECTED NOT DETECTED Final   Candida glabrata NOT DETECTED NOT DETECTED Final   Candida krusei NOT DETECTED NOT DETECTED Final   Candida parapsilosis NOT DETECTED NOT DETECTED Final   Candida tropicalis NOT DETECTED NOT DETECTED Final   Cryptococcus neoformans/gattii NOT DETECTED NOT DETECTED Final    Comment: Performed at [facility], [address] [place], [place] [address]  Urine Culture     Status: None   Collection Time:  08/05/22 11:00 PM   Specimen: In/Out Cath Urine  Result Value Ref Range Status   Specimen Description   Final    IN/OUT CATH URINE Performed at [facility], [address], [place], [place] [address]    Special Requests   Final    NONE Performed at [facility], [address] [place], [place] [address]  Culture   Final    NO GROWTH Performed at [facility], [place] [address], [place], [place] [address]    Report Status 08/07/2022 FINAL  Final     Time coordinating discharge: Over 30 minutes  SIGNED:   Shawna Clamp, MD  [facility] 08/09/2022, 4:39 PM Pager   If 7PM-7AM, please contact night-coverage

## 2022-08-09 NOTE — Plan of Care (Signed)

## 2022-08-09 NOTE — TOC Transition Note (Signed)
Transition of Care [facility]) - CM/SW Discharge Note   Patient Details  Name: Jill [name] MRN: [id number] Date of Birth: [birth year]  Transition of Care [facility] CM/SW Contact:  Curlene Labrum, RN Phone Number: 08/09/2022, 9:43 AM   Clinical Narrative:    CM met with the patient and granddaughter, [name] at the bedside to discuss transitions of care needs for home today.  The patient is S/P UTI and bronchitis and is currently on RA.  I spoke with the patient and family and gave Medicare choice regarding home health services and the granddaughter chose [facility].  I called Tommi Rumps, CM with [facility] and he accepted for home services.  Orders placed.  Patient has all DME at home - including BSC, WC, RW, shower seat and does not need additional DME.  The patient's granddaughter will be providing transportation to home today via car. Patient discharging today with family.   Final next level of care: [place] Barriers to Discharge: No Barriers Identified   Patient Goals and CMS Choice Patient states their goals for this hospitalization and ongoing recovery are:: To get better and return home CMS Medicare.gov Compare Post Acute Care list provided to:: Patient Choice offered to / list presented to : Patient (Granddaughter at bedside)  Discharge Placement                       Discharge Plan and Services   Discharge Planning Services: CM Consult Post Acute Care Choice: Home Health                    HH Arranged: PT, OT [facility] Agency: [facility] Date [place]: 08/09/22 Time [place]: 0940 Representative spoke with at [place]: Tommi Rumps, Lyndon with [facility]  Social Determinants of Health (SDOH) Interventions Housing Interventions: Intervention Not Indicated   Readmission Risk Interventions    08/09/2022    9:41 AM  Readmission Risk Prevention Plan  Transportation Screening Complete  PCP or Specialist Appt within 5-7 Days  Complete  Home Care Screening Complete  Medication Review (RN CM) Complete

## 2022-08-09 NOTE — Discharge Instructions (Signed)
Advised to follow-up with primary care physician in 1 week. Advised to take Keflex 500 mg 3 times daily for 4 more days for UTI. Advised to follow-up with infectious diseases Dr. Elba Barman on December 28 as scheduled.

## 2022-08-10 ENCOUNTER — Telehealth: Payer: Self-pay | Admitting: *Deleted

## 2022-08-10 ENCOUNTER — Inpatient Hospital Stay: Payer: Medicare PPO | Admitting: Family Medicine

## 2022-08-10 NOTE — Patient Outreach (Signed)
  Care Coordination TOC Note Transition Care Management Follow-up Telephone Call Date of discharge and from where: [facility] [phone]  How have you been since you were released from the hospital? She had a bad night with still a little confusion Any questions or concerns? No  Items Reviewed: Did the pt receive and understand the discharge instructions provided? Yes  Medications obtained and verified? Yes  Other? No  Any new allergies since your discharge? No  Dietary orders reviewed? No Do you have support at home? Yes   Home Care and Equipment/Supplies: Were home health services ordered? yes If so, what is the name of the agency? [facility]  Has the agency set up a time to come to the patient's home? yes Were any new equipment or medical supplies ordered?  No What is the name of the medical supply agency? n Were you able to get the supplies/equipment? not applicable Do you have any questions related to the use of the equipment or supplies? No  Functional Questionnaire: (I = Independent and D = Dependent) ADLs: D  Bathing/Dressing- D  Meal Prep- D  Eating- I  Maintaining continence- I  Transferring/Ambulation- D  Managing Meds- D  Follow up appointments reviewed:  PCP Hospital f/u appt confirmed? No  . [facility] f/u appt confirmed? Yes  Dr Gale Journey [id number] Are transportation arrangements needed? No  If their condition worsens, is the pt aware to call PCP or go to the Emergency Dept.? Yes Was the patient provided with contact information for the PCP's office or ED? Yes Was to pt encouraged to call back with questions or concerns? Yes  SDOH assessments and interventions completed:   Yes   Care Coordination Interventions:  PCP follow up appointment requested   Encounter Outcome:  Pt. Visit Completed    [facility] [phone]

## 2022-08-10 NOTE — Progress Notes (Unsigned)
  Care Coordination  Note  08/10/2022 Name: Jill [name] MRN: [id number] DOB: 04/10/1932  Jill [name] is a 88 y.o. year old primary care patient of Bedsole, Amy E, MD. I reached out to Jill [name] by phone today to assist with scheduling a follow up appointment. Jill [name] verbally consented to my assistance.       Follow up plan: Unsuccessful telephone outreach attempt made. A HIPAA compliant phone message was left for the patient providing contact information and requesting a return call.   Julian Hy, [place] Direct Dial: [phone]

## 2022-08-11 NOTE — Progress Notes (Signed)
  Care Coordination  Note  08/11/2022 Name: Jill [name] MRN: [id number] DOB: [birth year]  Jill [name] is a 88 y.o. year old primary care patient of Bedsole, Amy E, MD. I reached out to Jill [name] by phone today to assist with scheduling a follow up appointment. Jill [name] verbally consented to my assistance.       Follow up plan: Hospital Follow Up appointment scheduled with (Dr Zachery Dauer) on (08/24/2022) at (1140am).  Jill [name], [place] Direct Dial: [phone]

## 2022-08-15 ENCOUNTER — Other Ambulatory Visit: Payer: Self-pay | Admitting: Urology

## 2022-08-15 DIAGNOSIS — N952 Postmenopausal atrophic vaginitis: Secondary | ICD-10-CM

## 2022-08-16 ENCOUNTER — Telehealth: Payer: Self-pay | Admitting: Urology

## 2022-08-16 NOTE — Telephone Encounter (Signed)
Pts granddaughter Jill [name] aware.

## 2022-08-16 NOTE — Telephone Encounter (Signed)
Please let Jill [name] know that I sent in a one month refill for her estrogen cream, but it has been since last September since we have seen her.  She will need to make another appointment for more refills.

## 2022-08-17 ENCOUNTER — Telehealth: Payer: Self-pay | Admitting: Family Medicine

## 2022-08-17 ENCOUNTER — Ambulatory Visit: Payer: Medicare PPO | Admitting: Nurse Practitioner

## 2022-08-17 ENCOUNTER — Encounter: Payer: Self-pay | Admitting: Nurse Practitioner

## 2022-08-17 VITALS — BP 142/82 | HR 98 | Temp 97.9°F | Resp 16

## 2022-08-17 DIAGNOSIS — R339 Retention of urine, unspecified: Secondary | ICD-10-CM | POA: Diagnosis not present

## 2022-08-17 DIAGNOSIS — R410 Disorientation, unspecified: Secondary | ICD-10-CM

## 2022-08-17 DIAGNOSIS — Z87440 Personal history of urinary (tract) infections: Secondary | ICD-10-CM

## 2022-08-17 DIAGNOSIS — R7881 Bacteremia: Secondary | ICD-10-CM

## 2022-08-17 LAB — COMPREHENSIVE METABOLIC PANEL
ALT: 12 U/L (ref 0–35)
AST: 18 U/L (ref 0–37)
Albumin: 3.3 g/dL — ABNORMAL LOW (ref 3.5–5.2)
Alkaline Phosphatase: 80 U/L (ref 39–117)
BUN: 23 mg/dL (ref 6–23)
CO2: 28 mEq/L (ref 19–32)
Calcium: 8.4 mg/dL (ref 8.4–10.5)
Chloride: 102 mEq/L (ref 96–112)
Creatinine, Ser: 1.75 mg/dL — ABNORMAL HIGH (ref 0.40–1.20)
GFR: 25.7 mL/min — ABNORMAL LOW (ref >60.00)
Glucose, Bld: 290 mg/dL — ABNORMAL HIGH (ref 70–99)
Potassium: 4.5 mEq/L (ref 3.5–5.1)
Sodium: 138 mEq/L (ref 135–145)
Total Bilirubin: 0.3 mg/dL (ref 0.2–1.2)
Total Protein: 5.8 g/dL — ABNORMAL LOW (ref 6.0–8.3)

## 2022-08-17 LAB — CBC
HCT: 36.9 % (ref 36.0–46.0)
Hemoglobin: 11.9 g/dL — ABNORMAL LOW (ref 12.0–15.0)
MCHC: 32.4 g/dL (ref 30.0–36.0)
MCV: 92.7 fl (ref 78.0–100.0)
Platelets: 228 10*3/uL (ref 150.0–400.0)
RBC: 3.98 Mil/uL (ref 3.87–5.11)
RDW: 14.1 % (ref 11.5–15.5)
WBC: 14.4 10*3/uL — ABNORMAL HIGH (ref 4.0–10.5)

## 2022-08-17 LAB — POCT URINALYSIS DIPSTICK
Bilirubin, UA: NEGATIVE
Blood, UA: NEGATIVE
Glucose, UA: NEGATIVE
Ketones, UA: NEGATIVE
Leukocytes, UA: NEGATIVE
Nitrite, UA: NEGATIVE
Protein, UA: POSITIVE — AB
Spec Grav, UA: 1.015 (ref 1.010–1.025)
Urobilinogen, UA: 0.2 E.U./dL
pH, UA: 6 (ref 5.0–8.0)

## 2022-08-17 NOTE — Assessment & Plan Note (Signed)
Patient's altered mental status has increased since discharge from hospital.  She is not eating quite as well as she has been in the past.  UA was negative for pending blood cultures.  Lungs were clear to auscultation today.  This could be multifactorial in the setting of COPD.  Did instruct patient and patient's granddaughter if she becomes more somnolent breathing changes or she is not arousable to go to the nearest emergency department that she could be trapping CO2.  Will send off urine for culture to make sure no occult infection.  Patient is recently finished medications for urinary tract infection found in the hospital setting.  Patient also has a mass on her breast noted on CT scan and has grown since January 2023.  Will need follow-up with mammogram does have history of cysts and breast.  Patient recently underwent CT scan of head that was negative for acute changes

## 2022-08-17 NOTE — Patient Instructions (Signed)
Nice to see you today I will be in touch with the labs once I have them Follow up with Dr Diona Browner as scheduled. ED if needed

## 2022-08-17 NOTE — Telephone Encounter (Signed)
Verbal orders given to [facility] via telephone for PT 2 x week for 4 weeks, then 1 x week for 2 weeks.

## 2022-08-17 NOTE — Assessment & Plan Note (Addendum)
Patient with history of urinary tract infections urinary frequency and sounds to be urinary retention.  Patient was unable to urinate and patient's family has to be catheterized.  Verbal consent obtained.  Did have CMA in room during procedure.  Was cleaned with Calot's provided and then after sterile gloves were donned vagina was cleaned with iodine in the kit.  53 French Foley catheter was used and lieu of In-N-Out cath.  Successful on first attempt approximately 100 cc of urine drained that was light yellow color with no abnormal odor noted.  Patient tolerated procedure well  UA was negative in office given patient's age history and presentation will send off for urinary culture

## 2022-08-17 NOTE — Assessment & Plan Note (Signed)
Noted from recent hospitalization.  Patient has follow-up with ID tomorrow 08/18/2022.

## 2022-08-17 NOTE — Assessment & Plan Note (Signed)
UA in office negative pending culture given patient's presentation and age

## 2022-08-17 NOTE — Progress Notes (Signed)
Established Patient Office Visit  Subjective   Patient ID: Jill [name], female    DOB: 01/13/1932  Age: 88 y.o. MRN: [id number]  Chief Complaint  Patient presents with   Altered Mental Status    The decline has been fast    Urinary Frequency    Finished Antibiotic on Christmas eve   Fatigue    HPI  Of note patient was recently in the hospital.  She was admitted on 1215 discharged on 08/09/2022.She was diagnosed with pneumonia on and UTI.  Recommended to follow-up with PCP in 1 to 2 weeks she was discharged on Keflex 500 mg 3 times a day for 4 more days also advised to follow-up with the infectious disease Dr. Elba Barman she has an appointment scheduled on 1228 at the request PCP to refer patient to heme-onc for persistent lymphocytosis also newfound breast mass.  Patient does have a history of baseline of dementia along with CKD, COPD, DM, and HLD  AMS: States that she has a history of dementia but since recent hospitalizatoin has gotten worse. States that she is having altered mental status. States that she does not know the year or presdient. States that she does not recognize her house nor know where the bathroom is  States that she is still coughing but has gotten better. Per family they could not hear any bad things but yesterday they did hear rattling in her chest.   Urinary frequency: States that she is asking to go to the bathroom but not able to go. States that she is peeing. States that she is drinking lots of water. States that she feels like she is straining to urinate  Fatigue: States that she feels like she wants to sleep. Does not feel rested at home. The grand daughter states that she is getting weaker vs stronger. She has been evaluated twice for at home PT. Next visit is tomorrow      Review of Systems  Constitutional:  Positive for malaise/fatigue. Negative for chills and fever.  Respiratory:  Positive for cough and shortness of breath (baseline).   Cardiovascular:   Negative for chest pain.  Gastrointestinal:  Negative for abdominal pain, nausea and vomiting.  Genitourinary:  Positive for frequency. Negative for dysuria, flank pain and urgency.  Psychiatric/Behavioral:  Positive for memory loss.        Objective:     BP (!) 142/82   Pulse 98   Temp 97.9 F (36.6 C)   Resp 16   SpO2 94%  BP Readings from Last 3 Encounters:  08/17/22 (!) 142/82  08/09/22 (!) 155/85  08/01/22 (!) 171/73   Wt Readings from Last 3 Encounters:  08/06/22 168 lb (76.2 kg)  08/01/22 168 lb (76.2 kg)  08/01/22 168 lb (76.2 kg)      Physical Exam Vitals and nursing note reviewed. Exam conducted with a chaperone present ([place]).  Constitutional:      Comments: Patient in wheel chair  Cardiovascular:     Rate and Rhythm: Normal rate and regular rhythm.     Heart sounds: Normal heart sounds.  Pulmonary:     Breath sounds: Normal breath sounds.  Abdominal:     General: Bowel sounds are normal. There is no distension.     Palpations: There is no mass.     Tenderness: There is no abdominal tenderness. There is no right CVA tenderness or left CVA tenderness.     Hernia: No hernia is present.  Neurological:  Mental Status: She is alert.     Comments: Alert to self. Knew the month Did not know the year Knew how many quarters were in a dollar.        Results for orders placed or performed in visit on 08/17/22  POCT urinalysis dipstick  Result Value Ref Range   Color, UA yellow    Clarity, UA cloudy    Glucose, UA Negative Negative   Bilirubin, UA Negative    Ketones, UA Negative    Spec Grav, UA 1.015 1.010 - 1.025   Blood, UA Negative    pH, UA 6.0 5.0 - 8.0   Protein, UA Positive (A) Negative   Urobilinogen, UA 0.2 0.2 or 1.0 E.U./dL   Nitrite, UA Negative    Leukocytes, UA Negative Negative   Appearance     Odor        The ASCVD Risk score (Arnett DK, et al., 2019) failed to calculate for the following reasons:   The 2019  ASCVD risk score is only valid for ages 83 to 62    Assessment & Plan:   Problem List Items Addressed This Visit       Genitourinary   Urinary retention    Patient with history of urinary tract infections urinary frequency and sounds to be urinary retention.  Patient was unable to urinate and patient's family has to be catheterized.  Verbal consent obtained.  Did have CMA in room during procedure.  Was cleaned with Calot's provided and then after sterile gloves were donned vagina was cleaned with iodine in the kit.  1 French Foley catheter was used and lieu of In-N-Out cath.  Successful on first attempt approximately 100 cc of urine drained that was light yellow color with no abnormal odor noted.  Patient tolerated procedure well  UA was negative in office given patient's age history and presentation will send off for urinary culture      Relevant Orders   POCT urinalysis dipstick (Completed)   Urine Culture   History of UTI    UA in office negative pending culture given patient's presentation and age      Relevant Orders   POCT urinalysis dipstick (Completed)   Urine Culture     Other   Altered mental status - Primary    Patient's altered mental status has increased since discharge from hospital.  She is not eating quite as well as she has been in the past.  UA was negative for pending blood cultures.  Lungs were clear to auscultation today.  This could be multifactorial in the setting of COPD.  Did instruct patient and patient's granddaughter if she becomes more somnolent breathing changes or she is not arousable to go to the nearest emergency department that she could be trapping CO2.  Will send off urine for culture to make sure no occult infection.  Patient is recently finished medications for urinary tract infection found in the hospital setting.  Patient also has a mass on her breast noted on CT scan and has grown since January 2023.  Will need follow-up with mammogram does have  history of cysts and breast.  Patient recently underwent CT scan of head that was negative for acute changes      Relevant Orders   CBC   Comprehensive metabolic panel   Positive blood cultures    Noted from recent hospitalization.  Patient has follow-up with ID tomorrow 08/18/2022.       Return if symptoms worsen or fail  to improve, for As scheduled.    Romilda Garret, NP

## 2022-08-17 NOTE — Telephone Encounter (Signed)
Home Health verbal orders [place] Name: Santina Evans number: [phone]  Requesting OT/PT/Skilled nursing/Social Work/Speech:  Reason: PT  Frequency:2 W 4 ,  1 W 2   Please forward to [employer] pool or providers CMA

## 2022-08-17 NOTE — Telephone Encounter (Signed)
Noted  

## 2022-08-18 ENCOUNTER — Ambulatory Visit (INDEPENDENT_AMBULATORY_CARE_PROVIDER_SITE_OTHER): Payer: Medicare PPO | Admitting: Internal Medicine

## 2022-08-18 ENCOUNTER — Telehealth: Payer: Self-pay | Admitting: Family Medicine

## 2022-08-18 ENCOUNTER — Other Ambulatory Visit: Payer: Self-pay

## 2022-08-18 ENCOUNTER — Encounter: Payer: Self-pay | Admitting: Internal Medicine

## 2022-08-18 VITALS — BP 153/79 | HR 99 | Temp 98.2°F

## 2022-08-18 DIAGNOSIS — R7881 Bacteremia: Secondary | ICD-10-CM | POA: Diagnosis not present

## 2022-08-18 DIAGNOSIS — J181 Lobar pneumonia, unspecified organism: Secondary | ICD-10-CM

## 2022-08-18 DIAGNOSIS — T17908D Unspecified foreign body in respiratory tract, part unspecified causing other injury, subsequent encounter: Secondary | ICD-10-CM | POA: Diagnosis not present

## 2022-08-18 NOTE — Addendum Note (Signed)
Addended by: Prudencio Pair T on: 08/18/2022 11:35 AM   Modules accepted: Orders

## 2022-08-18 NOTE — Progress Notes (Signed)
[facility] for Infectious Disease  Patient Active Problem List   Diagnosis Date Noted   History of UTI 08/17/2022   Positive blood cultures 08/17/2022   UTI (urinary tract infection) 08/06/2022   Leukocytosis 08/06/2022   Breast mass in female 00/86/7619   Acute metabolic encephalopathy 50/93/2671   C. difficile colitis 03/22/2022   Cough 09/02/2021   History of heart failure 09/02/2021   Shortness of breath 09/02/2021   History of COPD 09/02/2021   Pelvic fracture ([place]) 04/07/2021   Frequent falls 04/07/2021   Situational anxiety 04/07/2021   Chronic gout without tophus 04/02/2021   Subdural hematoma ([place]) 01/19/2021   Recurrent Clostridioides difficile diarrhea 11/14/2020   Acute pain of right knee 09/29/2020   Mild dementia ([place]) 07/03/2020   Acute pain of right shoulder 07/03/2020   Bilateral leg weakness 05/21/2019   Lobar pneumonia, unspecified organism ([place]) 05/31/2018   Pulmonary nodule 05/29/2018   Urinary urgency 02/03/2017   Bilateral buttock pain 02/02/2017   Gout involving toe of right foot 12/23/2016   Candidal intertrigo 10/28/2016   Stage 2 moderate COPD by GOLD classification ([place]) 09/13/2016   Type 2 diabetes mellitus with stage 3 chronic kidney disease, without long-term current use of insulin ([place]) 08/08/2016   Chronic diastolic congestive heart failure ([place]) 08/08/2016   Intracranial bleed (H[facility])    BPPV (benign paroxysmal positional vertigo) 06/24/2016   Accidental fall 06/24/2016   Chronic neck pain 03/18/2016   Neuropathy due to type 2 diabetes mellitus ([place]) 03/18/2016   Anemia in chronic kidney disease 12/15/2015   Incomplete bladder emptying 10/15/2015   Abscess, renal/perirenal 10/15/2015   Bronchitis 06/02/2015   Lumbar back pain with radiculopathy affecting left lower extremity 05/05/2015   Urinary retention 04/03/2015   Lesion of left native kidney 01/28/2015   Chronic fatigue 01/22/2015   Chronic idiopathic urticaria  01/17/2015   Altered mental status 05/16/2014   S/P hip replacement 05/16/2014   DNR (do not resuscitate) 02/20/2014   ALLERGIC RHINITIS 12/17/2008   Vitamin D deficiency 07/01/2008   CKD (chronic kidney disease) stage 4, GFR 15-29 ml/min ([place]) 02/11/2008   DIVERTICULOSIS OF COLON 02/01/2008   COLONIC POLYPS, ADENOMATOUS 10/19/2006   Type 2 diabetes mellitus with diabetic neuropathy, unspecified ([place]) 10/19/2006   Hyperlipidemia associated with type 2 diabetes mellitus ([place]) 10/19/2006   OBESITY, NOS 10/19/2006   Major depression, recurrent ([place]) 10/19/2006   Hypertension associated with diabetes ([place]) 10/19/2006      Subjective:    Patient ID: Jill [name], female    DOB: 29-Jun-1932, 88 y.o.   MRN: [id number]  Chief Complaint  Patient presents with   Follow-up    HPI:  Jill [name] is a 88 y.o. female with ckd4, dementia here for hospital admission of presumed pna and CoNS bacteremia of unclear significance  Patient had finished another week of abx for pna Since discharge home still not mentating like before December 2022. Periods where she doesn't recognize her grandaughter, but easily remains redirectable.  Patient is eating ok, but not great A little cough but is much better since admission/before 07/2022 admission  ;had pcp visit yesterday. Labs showed improved leukocytosis and stable kidney function and normal lft  Has hh PT. Hh ot evaluated and deemed no need.   Allergies  Allergen Reactions   Lovastatin Other (See Comments)    REACTION: leg pain   Rocephin [Ceftriaxone] Hives   Statins Other (See Comments)    REACTION: leg cramps, weakness  Sulfa Antibiotics Hives and Itching   Codeine Rash   Niacin Rash    Flushing also      Outpatient Medications Prior to Visit  Medication Sig Dispense Refill   acetaminophen (TYLENOL) 500 MG tablet Take 1,000 mg by mouth every 6 (six) hours as needed for moderate pain or headache.     albuterol (VENTOLIN HFA)  108 (90 Base) MCG/ACT inhaler Inhale 2 puffs into the lungs every 6 (six) hours as needed for wheezing or shortness of breath. 8 g 0   allopurinol (ZYLOPRIM) 100 MG tablet TAKE 1/2 TABLET BY MOUTH EVERY OTHER DAY 45 tablet 0   Cranberry 500 MG CAPS Take 1 capsule by mouth daily.     diclofenac Sodium (VOLTAREN) 1 % GEL Apply 2 g topically 4 (four) times daily as needed (For pain).     donepezil (ARICEPT) 10 MG tablet TAKE 1 TABLET BY MOUTH EVERYDAY AT BEDTIME (Patient taking differently: Take 10 mg by mouth at bedtime.) 90 tablet 1   escitalopram (LEXAPRO) 20 MG tablet TAKE 1 TABLET BY MOUTH EVERY DAY (Patient taking differently: Take 20 mg by mouth daily.) 90 tablet 1   estradiol (ESTRACE) 0.1 MG/GM vaginal cream APPLY 0.'5MG'$  (PEA-SIZED AMOUNT) JUST INSIDE THE VAGINAL INTROITUS WITH A FINGER-TIP ON MONDAY, WEDNESDAY AND FRIDAY NIGHTS. 42.5 g 0   hydrOXYzine (ATARAX/VISTARIL) 10 MG tablet TAKE 0.5-1 TABLETS (5-10 MG TOTAL) BY MOUTH DAILY AS NEEDED FOR ANXIETY. 90 tablet 0   loratadine (CLARITIN) 10 MG tablet Take 10 mg by mouth daily.     Multiple Vitamins-Minerals (MULTIVITAMIN WITH MINERALS) tablet Take 1 tablet by mouth daily.     Probiotic Product (PROBIOTIC DAILY PO) Take 1 tablet by mouth daily.     TART CHERRY PO Take 1 tablet by mouth in the morning and at bedtime.     tiotropium (SPIRIVA HANDIHALER) 18 MCG inhalation capsule INHALE 1 CAPSULE VIA HANDIHALER ONCE DAILY AT THE SAME TIME EVERY DAY (Patient taking differently: Place 18 mcg into inhaler and inhale daily.) 30 capsule 5   Vitamin D, Ergocalciferol, (DRISDOL) 1.25 MG (50000 UNIT) CAPS capsule TAKE 1 CAPSULE (50,000 UNITS TOTAL) BY MOUTH EVERY FRIDAY. (Patient taking differently: Take 50,000 Units by mouth every 7 (seven) days.) 12 capsule 3   No facility-administered medications prior to visit.     Social History   Socioeconomic History   Marital status: Widowed    Spouse name: Not on file   Number of children: Not on file    Years of education: Not on file   Highest education level: Not on file  Occupational History   Occupation: retired  Tobacco Use   Smoking status: Former    Packs/day: 1.00    Years: 45.00    Total pack years: 45.00    Types: Cigarettes    Quit date: 06/16/1999    Years since quitting: 23.1   Smokeless tobacco: Never  Substance and Sexual Activity   Alcohol use: No    Alcohol/week: 0.0 standard drinks of alcohol   Drug use: No   Sexual activity: Not Currently  Other Topics Concern   Not on file  Social History Narrative   widowed; married for > 50 years; 33 pack year smoke - quit '02, no etoh; happy in marriage; 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 Diet: healthy diet      Son (  Evalyn Casco) HCPOA , has living will and DNR ( reviewed 2015)               Social Determinants of Health   Financial Resource Strain: Low Risk  (06/22/2022)   Overall Financial Resource Strain (CARDIA)    Difficulty of Paying Living Expenses: Not hard at all  Food Insecurity: No Food Insecurity (08/06/2022)   Hunger Vital Sign    Worried About Running Out of Food in the Last Year: Never true    Ran Out of Food in the Last Year: Never true  Transportation Needs: No Transportation Needs (08/06/2022)   PRAPARE - Hydrologist (Medical): No    Lack of Transportation (Non-Medical): No  Physical Activity: Inactive (06/22/2022)   Exercise Vital Sign    Days of Exercise per Week: 0 days    Minutes of Exercise per Session: 0 min  Stress: No Stress Concern Present (06/22/2022)   [place]    Feeling of Stress : Not at all  Social Connections: Moderately Integrated (06/22/2022)   Social Connection and Isolation Panel [NHANES]    Frequency of Communication with Friends and Family:  More than three times a week    Frequency of Social Gatherings with Friends and Family: More than three times a week    Attends Religious Services: More than 4 times per year    Active Member of [employer] or Organizations: Yes    Attends Archivist Meetings: More than 4 times per year    Marital Status: Widowed  Intimate Partner Violence: Not At Risk (08/06/2022)   Humiliation, Afraid, Rape, and Kick questionnaire    Fear of Current or Ex-Partner: No    Emotionally Abused: No    Physically Abused: No    Sexually Abused: No      Review of Systems     Objective:    BP (!) 153/79   Pulse 99   Temp 98.2 F (36.8 C) (Oral)   SpO2 90%  Nursing note and vital signs reviewed.  Physical Exam     General/constitutional: no distress, pleasant HEENT: Normocephalic, PER, Conj Clear, EOMI, Oropharynx clear Neck supple CV: rrr no mrg Lungs: clear to auscultation, normal respiratory effort Abd: Soft, Nontender Ext: no edema Skin: No Rash Neuro: nonfocal MSK: no peripheral joint swelling/tenderness/warmth; back spines nontender   Labs: Lab Results  Component Value Date   WBC 14.4 (H) 08/17/2022   HGB 11.9 (L) 08/17/2022   HCT 36.9 08/17/2022   MCV 92.7 08/17/2022   PLT 228.0 41/93/7902   Last metabolic panel Lab Results  Component Value Date   GLUCOSE 290 (H) 08/17/2022   NA 138 08/17/2022   K 4.5 08/17/2022   CL 102 08/17/2022   CO2 28 08/17/2022   BUN 23 08/17/2022   CREATININE 1.75 (H) 08/17/2022   GFRNONAA 24 (L) 08/09/2022   CALCIUM 8.4 08/17/2022   PHOS 4.4 08/09/2022   PROT 5.8 (L) 08/17/2022   ALBUMIN 3.3 (L) 08/17/2022   BILITOT 0.3 08/17/2022   ALKPHOS 80 08/17/2022   AST 18 08/17/2022   ALT 12 08/17/2022   ANIONGAP 7 08/09/2022    Micro:  Serology:  Imaging: Reviewed   12/11 chest ct 1. Interval resolution of many of the ground-glass and tree-in-bud nodules noted on prior examination. A few remain, likely infectious/inflammatory.  Non-contrast chest CT at 3-6 months is recommended. If the nodules are stable at time of repeat CT,  then future CT at 18-24 months (from today's scan) is considered optional for low-risk patients, but is recommended for high-risk patients. This recommendation follows the consensus statement: Guidelines for Management of Incidental Pulmonary Nodules Detected on CT Images: From the Fleischner Society 2017; Radiology 2017; 284:228-243. 2. Slight increase in size of ovoid lesion in the upper right breast at approximately 12 o'clock measuring 2.3 x 1.3 cm, previously 1.9 x 1.4 cm on 09/02/2021 and new compared to 05/31/2018. Recommend correlation with mammographic evaluation. 3. Coronary artery calcifications. Aortic Atherosclerosis  Assessment & Plan:   Problem List Items Addressed This Visit       Respiratory   Lobar pneumonia, unspecified organism ([place])   Relevant Orders   Blood culture (routine single)   Blood culture (routine single)   DG SWALLOW FUNC SPEECH PATH   Other Visit Diagnoses     Bacteremia    -  Primary   Relevant Orders   Blood culture (routine single)   Blood culture (routine single)   Aspiration into airway, subsequent encounter       Relevant Orders   DG SWALLOW FUNC SPEECH PATH         No orders of the defined types were placed in this encounter.    A/p Sepsis Pna CoNS bacteremia of unclear significance Ckd 4 Mild-moderate dementia   Clinically I do see improvement in terms of improving albumin (nutrition status), less cough, improved wbc  She is still functionally/cognitively not close to baseline  The concern of recurrent aspiration remains and I will refer her to imaging speech swallow study  Repeat blood culture today -- will call her if blood culture is positive  If fever, chill, declining mentation without improvement then let me know  F/u pcp to discuss speech imaging study and see if diet modication needs to be  done     Follow-up: No follow-ups on file.      Jabier Mutton, [facility] 08/18/2022, 10:55 AM

## 2022-08-18 NOTE — Telephone Encounter (Signed)
Claiborne Billings from [facility] called over and stated that some documents were faxed over for Jill [name]. She stated that there was about 4 discrepancies and just wanted to make you aware. Thank you!

## 2022-08-18 NOTE — Patient Instructions (Signed)
We have ordered speech swallow study Will get blood culture today too  If fever, chill, persistent decreased intake/mentation please let us know.  Otherwise please follow up with your pcp regarding the speech swallow result once it is done. If it shows aspiration risk, you'll need to have formal speech evaluation to discuss diet modification   No need to schedule follow up today -- will call you if the blood culture is positive

## 2022-08-19 LAB — URINE CULTURE
MICRO NUMBER:: 14360675
Result:: NO GROWTH
SPECIMEN QUALITY:: ADEQUATE

## 2022-08-23 ENCOUNTER — Telehealth: Payer: Self-pay | Admitting: Family Medicine

## 2022-08-23 NOTE — Telephone Encounter (Signed)
After review of med interactions... Please have pt stop hydroxyzine if still using.

## 2022-08-23 NOTE — Telephone Encounter (Signed)
Daughter returned call,would like a call back

## 2022-08-23 NOTE — Telephone Encounter (Signed)
LMTCB

## 2022-08-23 NOTE — Telephone Encounter (Signed)
Spoke to [place] daughter on [place] has not taken in a while but will make sure she does not take.

## 2022-08-23 NOTE — Telephone Encounter (Signed)
-----   Message from Carter Kitten, [place] sent at 08/19/2022  9:30 AM EST -----  ----- Message ----- From: Darryl Nestle Sent: 08/19/2022   9:03 AM EST To: Dierdre Forth

## 2022-08-24 ENCOUNTER — Encounter: Payer: Self-pay | Admitting: Family Medicine

## 2022-08-24 ENCOUNTER — Inpatient Hospital Stay: Payer: Medicare PPO | Admitting: Family Medicine

## 2022-08-24 LAB — CULTURE, BLOOD (SINGLE)
MICRO NUMBER:: 14367206
MICRO NUMBER:: 14367207
Result:: NO GROWTH
Result:: NO GROWTH
SPECIMEN QUALITY:: ADEQUATE
SPECIMEN QUALITY:: ADEQUATE

## 2022-08-26 ENCOUNTER — Ambulatory Visit: Payer: Medicare PPO | Admitting: Family Medicine

## 2022-08-26 ENCOUNTER — Encounter: Payer: Self-pay | Admitting: Family Medicine

## 2022-08-26 ENCOUNTER — Other Ambulatory Visit: Payer: Self-pay | Admitting: Family Medicine

## 2022-08-26 VITALS — BP 138/70 | HR 92 | Temp 98.2°F | Resp 16 | Ht 62.0 in | Wt 172.2 lb

## 2022-08-26 DIAGNOSIS — N289 Disorder of kidney and ureter, unspecified: Secondary | ICD-10-CM | POA: Diagnosis not present

## 2022-08-26 DIAGNOSIS — I152 Hypertension secondary to endocrine disorders: Secondary | ICD-10-CM

## 2022-08-26 DIAGNOSIS — N6310 Unspecified lump in the right breast, unspecified quadrant: Secondary | ICD-10-CM | POA: Diagnosis not present

## 2022-08-26 DIAGNOSIS — R4182 Altered mental status, unspecified: Secondary | ICD-10-CM | POA: Diagnosis not present

## 2022-08-26 DIAGNOSIS — E1159 Type 2 diabetes mellitus with other circulatory complications: Secondary | ICD-10-CM | POA: Diagnosis not present

## 2022-08-26 DIAGNOSIS — I5032 Chronic diastolic (congestive) heart failure: Secondary | ICD-10-CM

## 2022-08-26 DIAGNOSIS — G309 Alzheimer's disease, unspecified: Secondary | ICD-10-CM

## 2022-08-26 DIAGNOSIS — R400 Somnolence: Secondary | ICD-10-CM | POA: Diagnosis not present

## 2022-08-26 LAB — CBC WITH DIFFERENTIAL/PLATELET
Basophils Absolute: 0 10*3/uL (ref 0.0–0.1)
Basophils Relative: 0.5 % (ref 0.0–3.0)
Eosinophils Absolute: 0.1 10*3/uL (ref 0.0–0.7)
Eosinophils Relative: 1.3 % (ref 0.0–5.0)
HCT: 35.7 % — ABNORMAL LOW (ref 36.0–46.0)
Hemoglobin: 11.7 g/dL — ABNORMAL LOW (ref 12.0–15.0)
Lymphocytes Relative: 54.4 % — ABNORMAL HIGH (ref 12.0–46.0)
Lymphs Abs: 5.2 10*3/uL — ABNORMAL HIGH (ref 0.7–4.0)
MCHC: 32.8 g/dL (ref 30.0–36.0)
MCV: 92.4 fl (ref 78.0–100.0)
Monocytes Absolute: 0.8 10*3/uL (ref 0.1–1.0)
Monocytes Relative: 8.6 % (ref 3.0–12.0)
Neutro Abs: 3.4 10*3/uL (ref 1.4–7.7)
Neutrophils Relative %: 35.2 % — ABNORMAL LOW (ref 43.0–77.0)
Platelets: 278 10*3/uL (ref 150.0–400.0)
RBC: 3.86 Mil/uL — ABNORMAL LOW (ref 3.87–5.11)
RDW: 14 % (ref 11.5–15.5)
WBC: 9.6 10*3/uL (ref 4.0–10.5)

## 2022-08-26 LAB — BASIC METABOLIC PANEL
BUN: 25 mg/dL — ABNORMAL HIGH (ref 6–23)
CO2: 28 mEq/L (ref 19–32)
Calcium: 8.5 mg/dL (ref 8.4–10.5)
Chloride: 102 mEq/L (ref 96–112)
Creatinine, Ser: 1.82 mg/dL — ABNORMAL HIGH (ref 0.40–1.20)
GFR: 24.51 mL/min — ABNORMAL LOW (ref >60.00)
Glucose, Bld: 358 mg/dL — ABNORMAL HIGH (ref 70–99)
Potassium: 4.5 mEq/L (ref 3.5–5.1)
Sodium: 138 mEq/L (ref 135–145)

## 2022-08-26 MED ORDER — GLIPIZIDE ER 5 MG PO TB24: 5.0000 mg | ORAL_TABLET | Freq: Every day | ORAL | 6 refills | Status: DC

## 2022-08-26 NOTE — Progress Notes (Signed)
Patient ID: Jill [name], female    DOB: 1931-12-03, 89 y.o.   MRN: [id number]  This visit was conducted in person.  BP 138/70   Pulse 92   Temp 98.2 F (36.8 C)   Resp 16   Ht '5\' 2"'$  (1.575 m)   Wt 172 lb 4 oz (78.1 kg)   SpO2 98%   BMI 31.50 kg/m    CC:  Chief Complaint  Patient presents with   Hospitalization Follow-up    Subjective:   HPI: Jill [name] is a 89 y.o. female presenting on 08/26/2022 for Hospitalization Follow-up  She was admitted on 12/15 discharged on 08/09/2022.She was diagnosed with pneumonia on and UTI.  Recommended to follow-up with PCP in 1 to 2 weeks she was discharged on Keflex 500 mg 3 times a day for 4 more days also advised to follow-up with the infectious disease Dr. Elba Barman she has an appointment scheduled on 12/28    She had hospital follow up with Romilda Garret on  12/27 At that time mental status has worsened significantly, weak, sleepy. Has Negative catheterized UA, Urine culture: negative.  Reviewed CT head 12/15: IMPRESSION: No evidence of acute intracranial abnormality.  Atrophy with small vessel ischemic changes.  Renal CT 12/11:  IMPRESSION: 1. Limited evaluation due to motion and streak artifact originating from bilateral femoral surgical hardware. Limited evaluation on this noncontrast study. 2. Colonic diverticulosis with no acute diverticulitis. 3. Indeterminate right renal lesions. When the patient is clinically stable and able to follow directions and hold their breath (preferably as an outpatient) further evaluation with dedicated outpatient MRI renal protocol should be considered.  Chest CT: IMPRESSION: 1. Interval resolution of many of the ground-glass and tree-in-bud nodules noted on prior examination. A few remain, likely infectious/inflammatory. Non-contrast chest CT at 3-6 months is recommended. If the nodules are stable at time of repeat CT, then future CT at 18-24 months (from today's scan) is considered  optional for low-risk patients, but is recommended for high-risk patients. This recommendation follows the consensus statement: Guidelines for Management of Incidental Pulmonary Nodules Detected on CT Images: From the Fleischner Society 2017; Radiology 2017; 284:228-243. 2. Slight increase in size of ovoid lesion in the upper right breast at approximately 12 o'clock measuring 2.3 x 1.3 cm, previously 1.9 x 1.4 cm on 09/02/2021 and new compared to 05/31/2018. Recommend correlation with mammographic evaluation.  She does have a mass in her breast on CT that has grown since 08/2021 Relevant past medical, surgical, family and social history reviewed and updated as indicated. Interim medical history since our last visit reviewed. Allergies and medications reviewed and updated.  Negative blood cultures   Daughter and granddaughter state she doe not recognize her house.. her dementia is worsening.. she is more confused. No fever, no rash.  Continued cough, but some better.  Has irritated area on mid back. Eating and drinking well. Nml UOP.   Has appt with urology next week.  Dr. Candiss Norse  nephrology Wt Readings from Last 3 Encounters:  08/26/22 172 lb 4 oz (78.1 kg)  08/06/22 168 lb (76.2 kg)  08/01/22 168 lb (76.2 kg)    Has scheduled swallowing study.  She has been doing PT.. it is not helping.  Outpatient Medications Prior to Visit  Medication Sig Dispense Refill   acetaminophen (TYLENOL) 500 MG tablet Take 1,000 mg by mouth every 6 (six) hours as needed for moderate pain or headache.     albuterol (VENTOLIN HFA) 108 (90  Base) MCG/ACT inhaler Inhale 2 puffs into the lungs every 6 (six) hours as needed for wheezing or shortness of breath. 8 g 0   allopurinol (ZYLOPRIM) 100 MG tablet TAKE 1/2 TABLET BY MOUTH EVERY OTHER DAY 45 tablet 0   Cranberry 500 MG CAPS Take 1 capsule by mouth daily.     diclofenac Sodium (VOLTAREN) 1 % GEL Apply 2 g topically 4 (four) times daily as needed (For  pain).     donepezil (ARICEPT) 10 MG tablet TAKE 1 TABLET BY MOUTH EVERYDAY AT BEDTIME (Patient taking differently: Take 10 mg by mouth at bedtime.) 90 tablet 1   escitalopram (LEXAPRO) 20 MG tablet TAKE 1 TABLET BY MOUTH EVERY DAY (Patient taking differently: Take 20 mg by mouth daily.) 90 tablet 1   estradiol (ESTRACE) 0.1 MG/GM vaginal cream APPLY 0.'5MG'$  (PEA-SIZED AMOUNT) JUST INSIDE THE VAGINAL INTROITUS WITH A FINGER-TIP ON MONDAY, WEDNESDAY AND FRIDAY NIGHTS. 42.5 g 0   loratadine (CLARITIN) 10 MG tablet Take 10 mg by mouth daily.     Multiple Vitamins-Minerals (MULTIVITAMIN WITH MINERALS) tablet Take 1 tablet by mouth daily.     Probiotic Product (PROBIOTIC DAILY PO) Take 1 tablet by mouth daily.     TART CHERRY PO Take 1 tablet by mouth in the morning and at bedtime.     tiotropium (SPIRIVA HANDIHALER) 18 MCG inhalation capsule INHALE 1 CAPSULE VIA HANDIHALER ONCE DAILY AT THE SAME TIME EVERY DAY (Patient taking differently: Place 18 mcg into inhaler and inhale daily.) 30 capsule 5   Vitamin D, Ergocalciferol, (DRISDOL) 1.25 MG (50000 UNIT) CAPS capsule TAKE 1 CAPSULE (50,000 UNITS TOTAL) BY MOUTH EVERY FRIDAY. (Patient taking differently: Take 50,000 Units by mouth every 7 (seven) days.) 12 capsule 3   hydrOXYzine (ATARAX/VISTARIL) 10 MG tablet TAKE 0.5-1 TABLETS (5-10 MG TOTAL) BY MOUTH DAILY AS NEEDED FOR ANXIETY. (Patient not taking: Reported on 08/26/2022) 90 tablet 0   No facility-administered medications prior to visit.     Per HPI unless specifically indicated in ROS section below Review of Systems  Constitutional:  Negative for fatigue and fever.  HENT:  Negative for congestion.   Eyes:  Negative for pain.  Respiratory:  Negative for cough and shortness of breath.   Cardiovascular:  Negative for chest pain, palpitations and leg swelling.  Gastrointestinal:  Negative for abdominal pain.  Genitourinary:  Negative for dysuria and vaginal bleeding.  Musculoskeletal:  Negative for  back pain.  Neurological:  Negative for syncope, light-headedness and headaches.  Psychiatric/Behavioral:  Negative for dysphoric mood.    Objective:  BP 138/70   Pulse 92   Temp 98.2 F (36.8 C)   Resp 16   Ht '5\' 2"'$  (1.575 m)   Wt 172 lb 4 oz (78.1 kg)   SpO2 98%   BMI 31.50 kg/m   Wt Readings from Last 3 Encounters:  08/26/22 172 lb 4 oz (78.1 kg)  08/06/22 168 lb (76.2 kg)  08/01/22 168 lb (76.2 kg)      Physical Exam Constitutional:      General: She is not in acute distress.    Appearance: Normal appearance. She is well-developed. She is not ill-appearing or toxic-appearing.  HENT:     Head: Normocephalic.     Right Ear: Hearing, tympanic membrane, ear canal and external ear normal. Tympanic membrane is not erythematous, retracted or bulging.     Left Ear: Hearing, tympanic membrane, ear canal and external ear normal. Tympanic membrane is not erythematous, retracted or bulging.  Nose: No mucosal edema or rhinorrhea.     Right Sinus: No maxillary sinus tenderness or frontal sinus tenderness.     Left Sinus: No maxillary sinus tenderness or frontal sinus tenderness.     Mouth/Throat:     Pharynx: Uvula midline.  Eyes:     General: Lids are normal. Lids are everted, no foreign bodies appreciated.     Conjunctiva/sclera: Conjunctivae normal.     Pupils: Pupils are equal, round, and reactive to light.  Neck:     Thyroid: No thyroid mass or thyromegaly.     Vascular: No carotid bruit.     Trachea: Trachea normal.  Cardiovascular:     Rate and Rhythm: Normal rate and regular rhythm.     Pulses: Normal pulses.     Heart sounds: Normal heart sounds, S1 normal and S2 normal. No murmur heard.    No friction rub. No gallop.  Pulmonary:     Effort: Pulmonary effort is normal. No tachypnea or respiratory distress.     Breath sounds: Normal breath sounds. No decreased breath sounds, wheezing, rhonchi or rales.  Abdominal:     General: Bowel sounds are normal.      Palpations: Abdomen is soft.     Tenderness: There is no abdominal tenderness.  Musculoskeletal:     Cervical back: Normal range of motion and neck supple.  Skin:    General: Skin is warm and dry.     Findings: No rash.  Neurological:     Mental Status: She is alert. She is disoriented.     Cranial Nerves: Cranial nerves 2-12 are intact.     Sensory: Sensation is intact.     Motor: Weakness present.     Coordination: Coordination abnormal.     Gait: Gait abnormal.     Comments: Unable to stand on her own, in wheelchair in office  Psychiatric:        Mood and Affect: Mood is not anxious or depressed.        Speech: Speech normal.        Behavior: Behavior normal. Behavior is cooperative.        Thought Content: Thought content normal.        Judgment: Judgment normal.       Results for orders placed or performed in visit on 08/18/22  Blood culture (routine single)   Specimen: Blood  Result Value Ref Range   MICRO NUMBER: [id number]    SPECIMEN QUALITY: Adequate    Source BLOOD 1    STATUS: FINAL    Result: No growth after 5 days    COMMENT: Aerobic and anaerobic bottle received.   Blood culture (routine single)   Specimen: Blood  Result Value Ref Range   MICRO NUMBER: [id number]    SPECIMEN QUALITY: Adequate    Source BLOOD 2    STATUS: FINAL    Result: No growth after 5 days    COMMENT: Aerobic and anaerobic bottle received.      COVID 19 screen:  No recent travel or known exposure to COVID19 The patient denies respiratory symptoms of COVID 19 at this time. The importance of social distancing was discussed today.   Assessment and Plan Somnolence Assessment & Plan: Acute rapid change in mental status.  Will reevaluate with labs but so far no clear cause of significant change.  No new medications. Will also evaluate mental status change with MRI brain to rule out new stroke. There have been no recent head injuries known  but she does have a history of intracranial  bleed.  Orders: -     CBC with Differential/Platelet -     Basic metabolic panel -     Ambulatory referral to Hospice  Altered mental status, unspecified altered mental status type Assessment & Plan: Acute rapid change in mental status.  Will reevaluate with labs but so far no clear cause of significant change.  No new medications. Will also evaluate mental status change with MRI brain to rule out new stroke. There have been no recent head injuries known but she does have a history of intracranial bleed.  Orders: -     Ambulatory referral to Hospice  Alzheimer's disease, unspecified (CODE) ([place]) Assessment & Plan: Chronic, gradual worsening control.  Discussed benefit of palliative care versus hospice referral for family assistance and comfort care.  Given she has had a significant sudden decline in her mental status with no clear sign of infection or secondary cause, family would like to make sure there has been no new stroke.  Will evaluate with MRI brain.  Orders: -     Ambulatory referral to Hospice  Mass of right breast, unspecified quadrant Assessment & Plan: For possible breast cancer concerning.  Given her mental status level family does not wish to proceed with aggressive diagnosis and treatment. Referral placed Referral placed for hospice care   Orders: -     Ambulatory referral to Hospice  Renal lesion -     Ambulatory referral to Hospice  Chronic diastolic congestive heart failure ([place]) Assessment & Plan: Chronic, euvolemic in office today   Hypertension associated with diabetes [facility]) Assessment & Plan: Chronic, Now at goal on no medication.        Eliezer Lofts, MD

## 2022-08-27 NOTE — Progress Notes (Incomplete)
08/29/2022 2:45 PM   Jill [name] 11/08/1931 [id number]  Referring provider: Jinny Sanders, MD [address] [place],  [place] [address]   Urological history: 1. Renal cysts -CT renal stone study (2023) - Indeterminate right renal lesions   2. Renal abscess -MRI 2016 - resolved  3. rUTI's -contributing factors of age, vaginal atrophy, DM, C. Diff and incontinence -documented urine cultures over the last year  08/17/2022 - No Growth  08/05/2022 - No Growth  08/01/2022 - Klebsiella pneumoniae -vaginal estrogen cream    4. Vaginal atrophy/urethral caruncle -Vaginal estrogen cream has been misplaced and in need of new prescription  HPI: Jill [name] is a 89 y.o. female who presents today for  follow up.     Jill [name] has had continued worsening of her confusion since her last visit with [place] over one year ago.  She was recently hospitalized in December for an increase in confusion secondary to PNA and UTI.  During her stay, she was found to have a new breast mass and right renal lesions.    On the CT renal stone study, indeterminate right renal masses were noted with the largest being 4 cm.  Radiology recommended a MRI renal protocol.    PMH: Past Medical History:  Diagnosis Date   Allergy    Arthritis    osteoarthritis    Chronic kidney disease    Complication of anesthesia    difficult waking    Depression    Diabetes mellitus    Diverticula, colon    Family history of anesthesia complication    Son is difficult intubation   Hyperlipidemia    Hypertension    Vasovagal syncope 2006   Negative cardiac workup-myoview, echo    Surgical History: Past Surgical History:  Procedure Laterality Date   ABDOMINAL HYSTERECTOMY  1977   fibroid   JOINT REPLACEMENT  2009   rt hip   TOTAL HIP ARTHROPLASTY Left 05/12/2014   dr Mayer Camel   TOTAL HIP ARTHROPLASTY Left 05/12/2014   Procedure: TOTAL HIP ARTHROPLASTY;  Surgeon: Kerin Salen, MD;  Location: [facility];   Service: Orthopedics;  Laterality: Left;    Home Medications:  Allergies as of 08/29/2022       Reactions   Lovastatin Other (See Comments)   REACTION: leg pain   Rocephin [ceftriaxone] Hives   Statins Other (See Comments)   REACTION: leg cramps, weakness   Sulfa Antibiotics Hives, Itching   Codeine Rash   Niacin Rash   Flushing also        Medication List        Accurate as of August 27, 2022  2:45 PM. If you have any questions, ask your nurse or doctor.          acetaminophen 500 MG tablet Commonly known as: TYLENOL Take 1,000 mg by mouth every 6 (six) hours as needed for moderate pain or headache.   albuterol 108 (90 Base) MCG/ACT inhaler Commonly known as: VENTOLIN HFA Inhale 2 puffs into the lungs every 6 (six) hours as needed for wheezing or shortness of breath.   allopurinol 100 MG tablet Commonly known as: ZYLOPRIM TAKE 1/2 TABLET BY MOUTH EVERY OTHER DAY   Cranberry 500 MG Caps Take 1 capsule by mouth daily.   diclofenac Sodium 1 % Gel Commonly known as: VOLTAREN Apply 2 g topically 4 (four) times daily as needed (For pain).   donepezil 10 MG tablet Commonly known as: ARICEPT TAKE 1 TABLET  BY MOUTH EVERYDAY AT BEDTIME What changed: See the new instructions.   escitalopram 20 MG tablet Commonly known as: LEXAPRO TAKE 1 TABLET BY MOUTH EVERY DAY   estradiol 0.1 MG/GM vaginal cream Commonly known as: ESTRACE APPLY 0.'5MG'$  (PEA-SIZED AMOUNT) JUST INSIDE THE VAGINAL INTROITUS WITH A FINGER-TIP ON MONDAY, WEDNESDAY AND FRIDAY NIGHTS.   glipiZIDE 5 MG 24 hr tablet Commonly known as: Glucotrol XL Take 1 tablet (5 mg total) by mouth daily with breakfast.   loratadine 10 MG tablet Commonly known as: CLARITIN Take 10 mg by mouth daily.   multivitamin with minerals tablet Take 1 tablet by mouth daily.   PROBIOTIC DAILY PO Take 1 tablet by mouth daily.   Spiriva HandiHaler 18 MCG inhalation capsule Generic drug: tiotropium INHALE 1 CAPSULE VIA  HANDIHALER ONCE DAILY AT THE SAME TIME EVERY DAY What changed:  how much to take how to take this when to take this additional instructions   TART CHERRY PO Take 1 tablet by mouth in the morning and at bedtime.   Vitamin D (Ergocalciferol) 1.25 MG (50000 UNIT) Caps capsule Commonly known as: DRISDOL TAKE 1 CAPSULE (50,000 UNITS TOTAL) BY MOUTH EVERY FRIDAY. What changed: when to take this        Allergies:  Allergies  Allergen Reactions   Lovastatin Other (See Comments)    REACTION: leg pain   Rocephin [Ceftriaxone] Hives   Statins Other (See Comments)    REACTION: leg cramps, weakness   Sulfa Antibiotics Hives and Itching   Codeine Rash   Niacin Rash    Flushing also    Family History: Family History  Problem Relation Age of Onset   COPD Brother    Heart disease Brother    Diabetes Brother    Lymphoma Brother    Alzheimer's disease Brother    Pancreatic cancer Sister    Alzheimer's disease Sister    Heart disease Mother    Breast cancer Sister    Leukemia Other    Kidney disease Neg Hx     Social History:  reports that she quit smoking about 23 years ago. Her smoking use included cigarettes. She has a 45.00 pack-year smoking history. She has never used smokeless tobacco. She reports that she does not drink alcohol and does not use drugs.  ROS: Pertinent ROS in HPI  Physical Exam: There were no vitals taken for this visit.  Constitutional:  Well nourished. Alert and oriented, No acute distress. HEENT: [place] AT, moist mucus membranes.  Trachea midline, no masses. Cardiovascular: No clubbing, cyanosis, or edema. Respiratory: Normal respiratory effort, no increased work of breathing. GU: No CVA tenderness.  No bladder fullness or masses. Vulvovaginal atrophy w/ pallor, loss of rugae, introital retraction, excoriations.  Vulvar thinning, fusion of labia, clitoral hood retraction, prominent urethral meatus.   *** external genitalia, *** pubic hair distribution, no  lesions.  Normal urethral meatus, no lesions, no prolapse, no discharge.   No urethral masses, tenderness and/or tenderness. No bladder fullness, tenderness or masses. *** vagina mucosa, *** estrogen effect, no discharge, no lesions, *** pelvic support, *** cystocele and *** rectocele noted.  No cervical motion tenderness.  Uterus is freely mobile and non-fixed.  No adnexal/parametria masses or tenderness noted.  Anus and perineum are without rashes or lesions.   ***  Neurologic: Grossly intact, no focal deficits, moving all 4 extremities. Psychiatric: Normal mood and affect.    Laboratory Data: Serum creatinine (08/2022) 1.82 Hemoglobin/HCT (08/2022) 11.7/35.7 I have reviewed the labs.  Pertinent Imaging: Narrative & Impression  CLINICAL DATA:  CKD, UTI, leukocytosis, dementia   EXAM: CT ABDOMEN AND PELVIS WITHOUT CONTRAST   TECHNIQUE: Multidetector CT imaging of the abdomen and pelvis was performed following the standard protocol without IV contrast.   RADIATION DOSE REDUCTION: This exam was performed according to the departmental dose-optimization program which includes automated exposure control, adjustment of the mA and/or kV according to patient size and/or use of iterative reconstruction technique.   COMPARISON:  CT abdomen pelvis 11/06/2021   FINDINGS: Limited evaluation due to motion artifact.   Lower chest: Trace pericardial effusion.  No acute abnormality.   Hepatobiliary: No focal liver abnormality. No gallstones, gallbladder wall thickening, or pericholecystic fluid. No biliary dilatation.   Pancreas: Diffusely atrophic. No focal lesion. Otherwise normal pancreatic contour. No surrounding inflammatory changes. No main pancreatic ductal dilatation.   Spleen: Normal in size without focal abnormality.   Adrenals/Urinary Tract:   No adrenal nodule bilaterally.   Limited evaluation of the kidneys due to motion artifact. No nephrolithiasis and no hydronephrosis.  Hyperdense lesions within the right kidney with a density of 40-60 Hounsfield units.   No ureterolithiasis or hydroureter.   The urinary bladder is partially visualized with streak artifact from femoral hardware limiting evaluation.   Stomach/Bowel: Stomach is within normal limits. No evidence of bowel wall thickening or dilatation. Colonic diverticulosis. Appendix appears normal.   Vascular/Lymphatic: No abdominal aorta or iliac aneurysm. Severe atherosclerotic plaque of the aorta and its branches. No abdominal, pelvic, or inguinal lymphadenopathy.   Reproductive: Likely hysterectomy with nonvisualization due to streak artifact originating from bilateral femoral surgical hardware.   Other: No intraperitoneal free fluid. No intraperitoneal free gas. No organized fluid collection.   Musculoskeletal:   No abdominal wall hernia or abnormality.   No suspicious lytic or blastic osseous lesions. No acute displaced fracture. Multilevel degenerative changes of the spine. Total bilateral hip arthroplasties partially visualized.   IMPRESSION: 1. Limited evaluation due to motion and streak artifact originating from bilateral femoral surgical hardware. Limited evaluation on this noncontrast study. 2. Colonic diverticulosis with no acute diverticulitis. 3. Indeterminate right renal lesions. When the patient is clinically stable and able to follow directions and hold their breath (preferably as an outpatient) further evaluation with dedicated outpatient MRI renal protocol should be considered. 4.  Aortic Atherosclerosis (ICD10-I70.0).     Electronically Signed   By: Iven Finn M.D.   On: 08/01/2022 20:36  I have independently reviewed the films.  See HPI.       Assessment & Plan:    1. rUTI's -hospitalized recently for sepsis secondary to PNA and UTI  2. Vaginal atrophy -continue to apply the vaginal estrogen cream three nights weekly  3. Indeterminate right renal  lesions -discussed ultimate goals of care  -discussed the natural history of kidney cancer                                             No follow-ups on file.  These notes generated with voice recognition software. I apologize for typographical errors.  [place], [place] [address]  [place],  [address] [phone]

## 2022-08-29 ENCOUNTER — Ambulatory Visit: Payer: Medicare PPO | Admitting: Urology

## 2022-08-29 ENCOUNTER — Encounter: Payer: Self-pay | Admitting: *Deleted

## 2022-08-29 DIAGNOSIS — N952 Postmenopausal atrophic vaginitis: Secondary | ICD-10-CM

## 2022-08-29 DIAGNOSIS — R4182 Altered mental status, unspecified: Secondary | ICD-10-CM

## 2022-08-29 DIAGNOSIS — G309 Alzheimer's disease, unspecified: Secondary | ICD-10-CM

## 2022-08-29 DIAGNOSIS — N390 Urinary tract infection, site not specified: Secondary | ICD-10-CM

## 2022-08-29 DIAGNOSIS — R400 Somnolence: Secondary | ICD-10-CM

## 2022-08-29 DIAGNOSIS — N2889 Other specified disorders of kidney and ureter: Secondary | ICD-10-CM

## 2022-08-30 NOTE — Progress Notes (Signed)
08/31/2022 10:56 PM   Jill [name] [birth year] [id number]  Referring provider: Jinny Sanders, MD [address] [place],  [place] [address]   Urological history: 1. Renal cysts -CT renal stone study (2023) - Indeterminate right renal lesions   2. Renal abscess -MRI 2016 - resolved  3. rUTI's -contributing factors of age, vaginal atrophy, DM, C. Diff and incontinence -documented urine cultures over the last year  08/17/2022 - No Growth  08/05/2022 - No Growth  08/01/2022 - Klebsiella pneumoniae -vaginal estrogen cream    4. Vaginal atrophy/urethral caruncle -Vaginal estrogen cream has been misplaced and in need of new prescription  HPI: Jill [name] is a 89 y.o. female who presents today for  follow up with her son, Antony Haste, and her granddaughter, [name], by phone.    [name] has had continued worsening of her confusion since her last visit with [place] over one year ago.  She was recently hospitalized in December for an increase in confusion secondary to PNA and UTI.  During her stay, she was found to have a new breast mass and right renal lesions.    On the CT renal stone study, indeterminate right renal masses were noted with the largest being 4 cm.  Radiology recommended a MRI renal protocol.    They both inform me that [name] has been referred to hospice.    PMH: Past Medical History:  Diagnosis Date   Allergy    Arthritis    osteoarthritis    Chronic kidney disease    Complication of anesthesia    difficult waking    Depression    Diabetes mellitus    Diverticula, colon    Family history of anesthesia complication    Son is difficult intubation   Hyperlipidemia    Hypertension    Vasovagal syncope 2006   Negative cardiac workup-myoview, echo    Surgical History: Past Surgical History:  Procedure Laterality Date   ABDOMINAL HYSTERECTOMY  1977   fibroid   JOINT REPLACEMENT  2009   rt hip   TOTAL HIP ARTHROPLASTY Left 05/12/2014   dr  Mayer Camel   TOTAL HIP ARTHROPLASTY Left 05/12/2014   Procedure: TOTAL HIP ARTHROPLASTY;  Surgeon: Kerin Salen, MD;  Location: [facility];  Service: Orthopedics;  Laterality: Left;    Home Medications:  Allergies as of 08/31/2022       Reactions   Lovastatin Other (See Comments)   REACTION: leg pain   Rocephin [ceftriaxone] Hives   Statins Other (See Comments)   REACTION: leg cramps, weakness   Sulfa Antibiotics Hives, Itching   Codeine Rash   Niacin Rash   Flushing also        Medication List        Accurate as of August 31, 2022 11:59 PM. If you have any questions, ask your nurse or doctor.          acetaminophen 500 MG tablet Commonly known as: TYLENOL Take 1,000 mg by mouth every 6 (six) hours as needed for moderate pain or headache.   albuterol 108 (90 Base) MCG/ACT inhaler Commonly known as: VENTOLIN HFA Inhale 2 puffs into the lungs every 6 (six) hours as needed for wheezing or shortness of breath.   allopurinol 100 MG tablet Commonly known as: ZYLOPRIM TAKE 1/2 TABLET BY MOUTH EVERY OTHER DAY   Cranberry 500 MG Caps Take 1 capsule by mouth daily.   diclofenac Sodium 1 % Gel Commonly known as: VOLTAREN Apply 2  g topically 4 (four) times daily as needed (For pain).   donepezil 10 MG tablet Commonly known as: ARICEPT TAKE 1 TABLET BY MOUTH EVERYDAY AT BEDTIME   escitalopram 20 MG tablet Commonly known as: LEXAPRO TAKE 1 TABLET BY MOUTH EVERY DAY   estradiol 0.1 MG/GM vaginal cream Commonly known as: ESTRACE APPLY 0.'5MG'$  (PEA-SIZED AMOUNT) JUST INSIDE THE VAGINAL INTROITUS WITH A FINGER-TIP ON MONDAY, WEDNESDAY AND FRIDAY NIGHTS.   glipiZIDE 5 MG 24 hr tablet Commonly known as: Glucotrol XL Take 1 tablet (5 mg total) by mouth daily with breakfast.   loratadine 10 MG tablet Commonly known as: CLARITIN Take 10 mg by mouth daily.   multivitamin with minerals tablet Take 1 tablet by mouth daily.   PROBIOTIC DAILY PO Take 1 tablet by mouth daily.    Spiriva HandiHaler 18 MCG inhalation capsule Generic drug: tiotropium INHALE 1 CAPSULE VIA HANDIHALER ONCE DAILY AT THE SAME TIME EVERY DAY   TART CHERRY PO Take 1 tablet by mouth in the morning and at bedtime.   Vitamin D (Ergocalciferol) 1.25 MG (50000 UNIT) Caps capsule Commonly known as: DRISDOL TAKE 1 CAPSULE (50,000 UNITS TOTAL) BY MOUTH EVERY FRIDAY.        Allergies:  Allergies  Allergen Reactions   Lovastatin Other (See Comments)    REACTION: leg pain   Rocephin [Ceftriaxone] Hives   Statins Other (See Comments)    REACTION: leg cramps, weakness   Sulfa Antibiotics Hives and Itching   Codeine Rash   Niacin Rash    Flushing also    Family History: Family History  Problem Relation Age of Onset   COPD Brother    Heart disease Brother    Diabetes Brother    Lymphoma Brother    Alzheimer's disease Brother    Pancreatic cancer Sister    Alzheimer's disease Sister    Heart disease Mother    Breast cancer Sister    Leukemia Other    Kidney disease Neg Hx     Social History:  reports that she quit smoking about 23 years ago. Her smoking use included cigarettes. She has a 45.00 pack-year smoking history. She has been exposed to tobacco smoke. She has never used smokeless tobacco. She reports that she does not drink alcohol and does not use drugs.  ROS: Pertinent ROS in HPI  Physical Exam: BP (!) 149/85   Pulse 77   Ht '5\' 3"'$  (1.6 m)   Wt 172 lb (78 kg)   BMI 30.47 kg/m   Constitutional:  Well nourished. Alert and oriented, No acute distress. HEENT: [place] AT, moist mucus membranes.  Trachea midline Cardiovascular: No clubbing, cyanosis, or edema. Respiratory: Normal respiratory effort, no increased work of breathing. Neurologic: Grossly intact, no focal deficits, moving all 4 extremities. Psychiatric: Normal mood and affect.    Laboratory Data: Serum creatinine (08/2022) 1.82 Hemoglobin/HCT (08/2022) 11.7/35.7 I have reviewed the labs.   Pertinent  Imaging: Narrative & Impression  CLINICAL DATA:  CKD, UTI, leukocytosis, dementia   EXAM: CT ABDOMEN AND PELVIS WITHOUT CONTRAST   TECHNIQUE: Multidetector CT imaging of the abdomen and pelvis was performed following the standard protocol without IV contrast.   RADIATION DOSE REDUCTION: This exam was performed according to the departmental dose-optimization program which includes automated exposure control, adjustment of the mA and/or kV according to patient size and/or use of iterative reconstruction technique.   COMPARISON:  CT abdomen pelvis 11/06/2021   FINDINGS: Limited evaluation due to motion artifact.   Lower chest:  Trace pericardial effusion.  No acute abnormality.   Hepatobiliary: No focal liver abnormality. No gallstones, gallbladder wall thickening, or pericholecystic fluid. No biliary dilatation.   Pancreas: Diffusely atrophic. No focal lesion. Otherwise normal pancreatic contour. No surrounding inflammatory changes. No main pancreatic ductal dilatation.   Spleen: Normal in size without focal abnormality.   Adrenals/Urinary Tract:   No adrenal nodule bilaterally.   Limited evaluation of the kidneys due to motion artifact. No nephrolithiasis and no hydronephrosis. Hyperdense lesions within the right kidney with a density of 40-60 Hounsfield units.   No ureterolithiasis or hydroureter.   The urinary bladder is partially visualized with streak artifact from femoral hardware limiting evaluation.   Stomach/Bowel: Stomach is within normal limits. No evidence of bowel wall thickening or dilatation. Colonic diverticulosis. Appendix appears normal.   Vascular/Lymphatic: No abdominal aorta or iliac aneurysm. Severe atherosclerotic plaque of the aorta and its branches. No abdominal, pelvic, or inguinal lymphadenopathy.   Reproductive: Likely hysterectomy with nonvisualization due to streak artifact originating from bilateral femoral surgical hardware.    Other: No intraperitoneal free fluid. No intraperitoneal free gas. No organized fluid collection.   Musculoskeletal:   No abdominal wall hernia or abnormality.   No suspicious lytic or blastic osseous lesions. No acute displaced fracture. Multilevel degenerative changes of the spine. Total bilateral hip arthroplasties partially visualized.   IMPRESSION: 1. Limited evaluation due to motion and streak artifact originating from bilateral femoral surgical hardware. Limited evaluation on this noncontrast study. 2. Colonic diverticulosis with no acute diverticulitis. 3. Indeterminate right renal lesions. When the patient is clinically stable and able to follow directions and hold their breath (preferably as an outpatient) further evaluation with dedicated outpatient MRI renal protocol should be considered. 4.  Aortic Atherosclerosis (ICD10-I70.0).     Electronically Signed   By: Iven Finn M.D.   On: 08/01/2022 20:36  I have independently reviewed the films.  See HPI.       Assessment & Plan:    1. rUTI's -hospitalized recently for sepsis secondary to PNA and UTI  2. Vaginal atrophy -discontinue the vaginal estrogen cream at this time due to new finding of a breast mass  3. Indeterminate right renal lesions -discussed ultimate goals of care  -discussed the natural history of kidney cancer -her son and and granddaughter would like to proceed with further imaging at this time so that they have as much information regarding Mrs. Standage health and they can make the appropriate decisions regarding her care moving forward -she has an upcoming MRI of her brain, so we will obtain a MRI of her kidney at the same time                                             Return for MRI of kidneys .  These notes generated with voice recognition software. I apologize for typographical errors.  Royden Purl  [facility] [address]  [place],  [address] [phone]   I spent 30 minutes on the day of the encounter to include pre-visit record review, face-to-face time with the patient, and post-visit ordering of tests.

## 2022-08-31 ENCOUNTER — Encounter: Payer: Self-pay | Admitting: Urology

## 2022-08-31 ENCOUNTER — Ambulatory Visit: Admitting: Urology

## 2022-08-31 VITALS — BP 149/85 | HR 77 | Ht 63.0 in | Wt 172.0 lb

## 2022-08-31 DIAGNOSIS — N2889 Other specified disorders of kidney and ureter: Secondary | ICD-10-CM | POA: Diagnosis not present

## 2022-08-31 DIAGNOSIS — N952 Postmenopausal atrophic vaginitis: Secondary | ICD-10-CM

## 2022-08-31 DIAGNOSIS — N390 Urinary tract infection, site not specified: Secondary | ICD-10-CM

## 2022-08-31 NOTE — Telephone Encounter (Signed)
Dr Diona Browner,   Please change location of MRI from [facility] to [facility] Location had to be changed due to insurance - [facility] will not schedule without the order having their location attached.   Thanks!

## 2022-09-05 ENCOUNTER — Ambulatory Visit
Admission: RE | Admit: 2022-09-05 | Discharge: 2022-09-05 | Disposition: A | Discharge status: Home / Self Care | Payer: Medicare Other | Source: Ambulatory Visit | Attending: Internal Medicine | Admitting: Internal Medicine

## 2022-09-05 DIAGNOSIS — T17908D Unspecified foreign body in respiratory tract, part unspecified causing other injury, subsequent encounter: Secondary | ICD-10-CM | POA: Diagnosis not present

## 2022-09-05 DIAGNOSIS — J181 Lobar pneumonia, unspecified organism: Secondary | ICD-10-CM | POA: Insufficient documentation

## 2022-09-05 NOTE — Progress Notes (Signed)
Modified Barium Swallow Progress Note  Patient Details  Name: Jill [name] MRN: [id number] Date of Birth: 1932/06/04  Today's Date: 09/05/2022  Modified Barium Swallow completed.  Full report located under Chart Review in the Imaging Section.  Brief recommendations include the following:  Clinical Impression  Pt presents with adequate oropharyngeal abilities when consuming puree, graham crackers with puree, thin liquids via cup and whole barium tablet with thin liquids via cup. At this time, pt's aspiration risk is considered low when following general aspiration precautions. Further ST intervention is not indicated at this time.   Swallow Evaluation Recommendations       SLP Diet Recommendations: Regular solids;Thin liquid   Liquid Administration via: Cup   Medication Administration: Whole meds with liquid   Supervision: Patient able to self feed   Compensations: Minimize environmental distractions;Slow rate;Small sips/bites   Postural Changes: Seated upright at 90 degrees   Oral Care Recommendations: Oral care BID      Kuzey Ogata B. Rutherford Nail, M.S., CCC-SLP, Mining engineer Certified Brain Injury [place]  [place] Office [phone] Ascom [phone] Fax [phone]

## 2022-09-07 ENCOUNTER — Encounter: Payer: Self-pay | Admitting: Family Medicine

## 2022-09-08 NOTE — Telephone Encounter (Signed)
Pt appt cancele and rescheduled for 1/26

## 2022-09-09 ENCOUNTER — Ambulatory Visit: Payer: Medicare PPO | Admitting: Family Medicine

## 2022-09-10 ENCOUNTER — Other Ambulatory Visit: Payer: Medicare PPO

## 2022-09-11 ENCOUNTER — Other Ambulatory Visit: Payer: Medicare PPO

## 2022-09-15 ENCOUNTER — Ambulatory Visit: Payer: Medicare PPO | Admitting: Urology

## 2022-09-15 NOTE — Assessment & Plan Note (Signed)
Chronic, Now at goal on no medication.

## 2022-09-15 NOTE — Assessment & Plan Note (Signed)
Chronic, euvolemic in office today

## 2022-09-15 NOTE — Assessment & Plan Note (Signed)
Acute rapid change in mental status.  Will reevaluate with labs but so far no clear cause of significant change.  No new medications. Will also evaluate mental status change with MRI brain to rule out new stroke. There have been no recent head injuries known but she does have a history of intracranial bleed.

## 2022-09-15 NOTE — Assessment & Plan Note (Addendum)
For possible breast cancer concerning.  Given her mental status level family does not wish to proceed with aggressive diagnosis and treatment. Referral placed Referral placed for hospice care

## 2022-09-15 NOTE — Assessment & Plan Note (Signed)
Chronic, gradual worsening control.  Discussed benefit of palliative care versus hospice referral for family assistance and comfort care.  Given she has had a significant sudden decline in her mental status with no clear sign of infection or secondary cause, family would like to make sure there has been no new stroke.  Will evaluate with MRI brain.

## 2022-09-16 ENCOUNTER — Ambulatory Visit: Payer: Medicare PPO | Admitting: Family Medicine

## 2022-09-30 ENCOUNTER — Other Ambulatory Visit: Payer: Self-pay | Admitting: Family Medicine

## 2022-10-01 ENCOUNTER — Ambulatory Visit
Admission: RE | Admit: 2022-10-01 | Discharge: 2022-10-01 | Disposition: A | Discharge status: Home / Self Care | Payer: Medicare PPO | Source: Ambulatory Visit | Attending: Urology | Admitting: Urology

## 2022-10-01 ENCOUNTER — Ambulatory Visit
Admission: RE | Admit: 2022-10-01 | Discharge: 2022-10-01 | Disposition: A | Discharge status: Home / Self Care | Payer: Medicare PPO | Source: Ambulatory Visit | Attending: Family Medicine | Admitting: Family Medicine

## 2022-10-01 DIAGNOSIS — N281 Cyst of kidney, acquired: Secondary | ICD-10-CM | POA: Diagnosis not present

## 2022-10-01 DIAGNOSIS — G309 Alzheimer's disease, unspecified: Secondary | ICD-10-CM

## 2022-10-01 DIAGNOSIS — I6782 Cerebral ischemia: Secondary | ICD-10-CM | POA: Diagnosis not present

## 2022-10-01 DIAGNOSIS — K760 Fatty (change of) liver, not elsewhere classified: Secondary | ICD-10-CM | POA: Diagnosis not present

## 2022-10-01 DIAGNOSIS — R4182 Altered mental status, unspecified: Secondary | ICD-10-CM

## 2022-10-01 DIAGNOSIS — N2889 Other specified disorders of kidney and ureter: Secondary | ICD-10-CM

## 2022-10-01 DIAGNOSIS — I616 Nontraumatic intracerebral hemorrhage, multiple localized: Secondary | ICD-10-CM | POA: Diagnosis not present

## 2022-10-01 DIAGNOSIS — N261 Atrophy of kidney (terminal): Secondary | ICD-10-CM | POA: Diagnosis not present

## 2022-10-01 DIAGNOSIS — R400 Somnolence: Secondary | ICD-10-CM

## 2022-10-01 DIAGNOSIS — G319 Degenerative disease of nervous system, unspecified: Secondary | ICD-10-CM | POA: Diagnosis not present

## 2022-10-04 ENCOUNTER — Ambulatory Visit: Payer: Medicare PPO | Admitting: Family Medicine

## 2022-10-05 ENCOUNTER — Encounter: Payer: Self-pay | Admitting: Family Medicine

## 2022-10-05 ENCOUNTER — Telehealth (INDEPENDENT_AMBULATORY_CARE_PROVIDER_SITE_OTHER): Admitting: Family Medicine

## 2022-10-05 VITALS — Ht 63.0 in | Wt 157.0 lb

## 2022-10-05 DIAGNOSIS — G309 Alzheimer's disease, unspecified: Secondary | ICD-10-CM

## 2022-10-05 DIAGNOSIS — I639 Cerebral infarction, unspecified: Secondary | ICD-10-CM

## 2022-10-05 NOTE — Progress Notes (Signed)
10/06/2022 2:20 PM   Jill [name] 1932-04-27 [id number]  Referring provider: Jinny Sanders, MD [address] [place],  [place] [address]   Urological history: 1. Renal cysts -CT renal stone study (2023) - Indeterminate right renal lesions   2. Renal abscess -MRI 2016 - resolved  3. rUTI's -contributing factors of age, vaginal atrophy, DM, C. Diff and incontinence -documented urine cultures over the last year  08/17/2022 - No Growth  08/05/2022 - No Growth  08/01/2022 - Klebsiella pneumoniae -vaginal estrogen cream    4. Vaginal atrophy/urethral caruncle -Vaginal estrogen cream has been misplaced and in need of new prescription  HPI: Jill [name] is a 89 y.o. female who presents today for follow up to review MRI results of her kidneys with her, granddaughter, [name].   On the CT renal stone study, indeterminate right renal masses were noted with the largest being 4 cm.  Radiology recommended a MRI renal protocol.    MRI (09/2022) Examination is significantly limited by breath motion artifact  throughout as well as lack of intravenous contrast. Please note that noncontrast MR is significantly limited for the characterization of abdominal organ lesions and should generally be avoided.  An indeterminate inferior pole right renal lesion of interest previously identified by CT is unfortunately poorly imaged on today's examination due to breath motion artifact and field inhomogeneity at the inferior extent of the exam, but appears to demonstrate at least some signal characteristics suggesting a hemorrhagic or proteinaceous cyst, and measures 1.6 x 1.5 cm.  Numerous additional bilateral renal lesions, the majority of which are fluid signal, benign simple renal cortical cysts and hemorrhagic or proteinaceous cysts, likewise benign, for which no specific further follow-up or characterization is required.  PMH: Past Medical History:  Diagnosis Date   Allergy     Arthritis    osteoarthritis    Chronic kidney disease    Complication of anesthesia    difficult waking    Depression    Diabetes mellitus    Diverticula, colon    Family history of anesthesia complication    Son is difficult intubation   Hyperlipidemia    Hypertension    Vasovagal syncope 2006   Negative cardiac workup-myoview, echo    Surgical History: Past Surgical History:  Procedure Laterality Date   ABDOMINAL HYSTERECTOMY  1977   fibroid   JOINT REPLACEMENT  2009   rt hip   TOTAL HIP ARTHROPLASTY Left 05/12/2014   dr Mayer Camel   TOTAL HIP ARTHROPLASTY Left 05/12/2014   Procedure: TOTAL HIP ARTHROPLASTY;  Surgeon: Kerin Salen, MD;  Location: [facility];  Service: Orthopedics;  Laterality: Left;    Home Medications:  Allergies as of 10/06/2022       Reactions   Lovastatin Other (See Comments)   REACTION: leg pain   Rocephin [ceftriaxone] Hives   Statins Other (See Comments)   REACTION: leg cramps, weakness   Sulfa Antibiotics Hives, Itching   Codeine Rash   Niacin Rash   Flushing also        Medication List        Accurate as of October 06, 2022  2:20 PM. If you have any questions, ask your nurse or doctor.          acetaminophen 500 MG tablet Commonly known as: TYLENOL Take 1,000 mg by mouth every 6 (six) hours as needed for moderate pain or headache.   albuterol 108 (90 Base) MCG/ACT inhaler Commonly known as: VENTOLIN HFA  Inhale 2 puffs into the lungs every 6 (six) hours as needed for wheezing or shortness of breath.   allopurinol 100 MG tablet Commonly known as: ZYLOPRIM TAKE 1/2 TABLET BY MOUTH EVERY OTHER DAY   Cranberry 500 MG Caps Take 1 capsule by mouth daily.   donepezil 10 MG tablet Commonly known as: ARICEPT TAKE 1 TABLET BY MOUTH EVERYDAY AT BEDTIME   escitalopram 20 MG tablet Commonly known as: LEXAPRO TAKE 1 TABLET BY MOUTH EVERY DAY   glipiZIDE 5 MG 24 hr tablet Commonly known as: Glucotrol XL Take 1 tablet (5 mg total) by  mouth daily with breakfast.   loratadine 10 MG tablet Commonly known as: CLARITIN Take 10 mg by mouth daily.   multivitamin with minerals tablet Take 1 tablet by mouth daily.   PROBIOTIC DAILY PO Take 1 tablet by mouth daily.   Spiriva HandiHaler 18 MCG inhalation capsule Generic drug: tiotropium INHALE 1 CAPSULE VIA HANDIHALER ONCE DAILY AT THE SAME TIME EVERY DAY   TART CHERRY PO Take 1 tablet by mouth in the morning and at bedtime.   Vitamin D (Ergocalciferol) 1.25 MG (50000 UNIT) Caps capsule Commonly known as: DRISDOL TAKE 1 CAPSULE (50,000 UNITS TOTAL) BY MOUTH EVERY FRIDAY.        Allergies:  Allergies  Allergen Reactions   Lovastatin Other (See Comments)    REACTION: leg pain   Rocephin [Ceftriaxone] Hives   Statins Other (See Comments)    REACTION: leg cramps, weakness   Sulfa Antibiotics Hives and Itching   Codeine Rash   Niacin Rash    Flushing also    Family History: Family History  Problem Relation Age of Onset   COPD Brother    Heart disease Brother    Diabetes Brother    Lymphoma Brother    Alzheimer's disease Brother    Pancreatic cancer Sister    Alzheimer's disease Sister    Heart disease Mother    Breast cancer Sister    Leukemia Other    Kidney disease Neg Hx     Social History:  reports that she quit smoking about 23 years ago. Her smoking use included cigarettes. She has a 45.00 pack-year smoking history. She has been exposed to tobacco smoke. She has never used smokeless tobacco. She reports that she does not drink alcohol and does not use drugs.  ROS: Pertinent ROS in HPI  Physical Exam: BP (!) 158/85   Pulse 79   Ht '5\' 3"'$  (1.6 m)   Wt 172 lb (78 kg)   BMI 30.47 kg/m   Constitutional:  Well nourished. Alert and oriented, No acute distress. HEENT: [place] AT, moist mucus membranes.  Trachea midline Cardiovascular: No clubbing, cyanosis, or edema. Respiratory: Normal respiratory effort, no increased work of  breathing. Neurologic: Grossly intact, no focal deficits, moving all 4 extremities. Psychiatric: Normal mood and affect.    Laboratory Data: N/A  Pertinent Imaging: CLINICAL DATA:  Indeterminate right renal lesions incidentally identified by prior CT   EXAM: MRI ABDOMEN WITHOUT CONTRAST   TECHNIQUE: Multiplanar multisequence MR imaging was performed without the administration of intravenous contrast.   COMPARISON:  CT abdomen pelvis, 08/01/2022   FINDINGS: Examination is significantly limited by breath motion artifact throughout as well as lack of intravenous contrast.   Lower chest: No acute abnormality.   Hepatobiliary: No solid liver abnormality is seen. Mild hepatic steatosis. Gallstones or gallbladder wall thickening. No biliary ductal dilatation.   Pancreas: Unremarkable. No pancreatic ductal dilatation or surrounding inflammatory  changes.   Spleen: Normal in size without significant abnormality.   Adrenals/Urinary Tract: Adrenal glands are unremarkable. Mildly atrophic kidneys. Numerous bilateral renal lesions, the majority of which are fluid signal, benign simple renal cortical cysts, for which no further follow-up or characterization is required. There are multiple additional bilateral intrinsically T1 hyperintense, T2 intermediate or hypointense hemorrhagic or proteinaceous cysts, also benign. A lesion of interest previously identified prior CT is unfortunately poorly imaged on today's examination due to breath motion artifact and field inhomogeneity at the inferior extent of the exam, but appears to demonstrate at least some intrinsic T1 hyperintensity (series 10, image 56) and T2 intermediate signal (series 9, image 21), lesion measures 1.6 x 1.5 cm. No hydronephrosis.   Stomach/Bowel: Stomach is within normal limits. No evidence of bowel wall thickening, distention, or inflammatory changes.   Vascular/Lymphatic: No significant vascular findings are  present. No enlarged abdominal lymph nodes.   Other: No abdominal wall hernia or abnormality. No ascites.   Musculoskeletal: No acute or significant osseous findings.   IMPRESSION: 1. Examination is significantly limited by breath motion artifact throughout as well as lack of intravenous contrast. Please note that noncontrast MR is significantly limited for the characterization of abdominal organ lesions and should generally be avoided. 2. An indeterminate inferior pole right renal lesion of interest previously identified by CT is unfortunately poorly imaged on today's examination due to breath motion artifact and field inhomogeneity at the inferior extent of the exam, but appears to demonstrate at least some signal characteristics suggesting a hemorrhagic or proteinaceous cyst, and measures 1.6 x 1.5 cm. Recommend follow-up contrast enhanced multiphasic CT in 6 months to confidently characterize if clinically appropriate, MRI not likely the most appropriate test for future evaluation given very significant breath motion artifact limitations of today's examination. 3. Numerous additional bilateral renal lesions, the majority of which are fluid signal, benign simple renal cortical cysts and hemorrhagic or proteinaceous cysts, likewise benign, for which no specific further follow-up or characterization is required. 4. Mild hepatic steatosis.     Electronically Signed   By: Delanna Ahmadi M.D.   On: 10/01/2022 17:29 I have independently reviewed the films.  See HPI.       Assessment & Plan:    1. Indeterminate right renal lesions -Reviewed the results of the MRI scan and advised them that there was some motion degradation -discussed ultimate goals of care  -Mrs. Jenkins [place] states that she is 89 years old and she does not want to pursue any more imaging studies at this time and her granddaughter and I are in agreement -she will follow up prn  2. rUTI's -asymptomatic at this  visit  3. Vaginal atrophy -discontinue the vaginal estrogen cream at this time due to new finding of a breast mass                                              Return if symptoms worsen or fail to improve.  These notes generated with voice recognition software. I apologize for typographical errors.  [place], [place] [address]  [place], [place] [address] [phone]

## 2022-10-05 NOTE — Progress Notes (Signed)
VIRTUAL VISIT A virtual visit is felt to be most appropriate for this patient at this time.   I connected with the patient on 10/05/22 at  2:00 PM EST by virtual telehealth platform and verified that I am speaking with the correct person using two identifiers.   I discussed the limitations, risks, security and privacy concerns of performing an evaluation and management service by  virtual telehealth platform and the availability of in person appointments. I also discussed with the patient that there may be a patient responsible charge related to this service. The patient expressed understanding and agreed to proceed.  Patient location: Home Provider Location: [place] Participants: Jill [name] and Jill [name]   Chief Complaint  Patient presents with   Medical Management of Chronic Issues    Discuss MRI and memory     History of Present Illness:  89 y.o. female patient of Jill Lynk E, MD presents  for review of recent MR I to evaluate decline in mental status, somnolence.   Granddaughter reports [name] energy is much better overall.  Awareness is better but still memory.  She is less confused about location, but still some issue.    She reports no pain, no SOB, no new rashes.   MRI 10/01/2021 IMPRESSION: 1. Possible punctate acute/subacute subcortical infarct in the right occipital lobe. 2. Severe chronic small vessel ischemic disease, progressed from 2017. 3. Numerous chronic cerebral microhemorrhages which could reflect cerebral amyloid angiopathy. 4. Moderate cerebral atrophy.    COVID 19 screen No recent travel or known exposure to COVID19 The patient denies respiratory symptoms of COVID 19 at this time.  The importance of social distancing was discussed today.   Review of Systems  Constitutional:  Negative for chills and fever.  HENT:  Negative for congestion and ear pain.   Eyes:  Negative for pain and redness.  Respiratory:  Negative for  cough and shortness of breath.   Cardiovascular:  Negative for chest pain, palpitations and leg swelling.  Gastrointestinal:  Negative for abdominal pain, blood in stool, constipation, diarrhea, nausea and vomiting.  Genitourinary:  Negative for dysuria.  Musculoskeletal:  Negative for falls and myalgias.  Skin:  Negative for rash.  Neurological:  Negative for dizziness.  Psychiatric/Behavioral:  Negative for depression. The patient is not nervous/anxious.       Past Medical History:  Diagnosis Date   Allergy    Arthritis    osteoarthritis    Chronic kidney disease    Complication of anesthesia    difficult waking    Depression    Diabetes mellitus    Diverticula, colon    Family history of anesthesia complication    Son is difficult intubation   Hyperlipidemia    Hypertension    Vasovagal syncope 2006   Negative cardiac workup-myoview, echo    reports that she quit smoking about 23 years ago. Her smoking use included cigarettes. She has a 45.00 pack-year smoking history. She has been exposed to tobacco smoke. She has never used smokeless tobacco. She reports that she does not drink alcohol and does not use drugs.   Current Outpatient Medications:    acetaminophen (TYLENOL) 500 MG tablet, Take 1,000 mg by mouth every 6 (six) hours as needed for moderate pain or headache., Disp: , Rfl:    albuterol (VENTOLIN HFA) 108 (90 Base) MCG/ACT inhaler, Inhale 2 puffs into the lungs every 6 (six) hours as needed for wheezing or shortness of breath., Disp: 8 g, Rfl:  0   allopurinol (ZYLOPRIM) 100 MG tablet, TAKE 1/2 TABLET BY MOUTH EVERY OTHER DAY, Disp: 45 tablet, Rfl: 0   Cranberry 500 MG CAPS, Take 1 capsule by mouth daily., Disp: , Rfl:    donepezil (ARICEPT) 10 MG tablet, TAKE 1 TABLET BY MOUTH EVERYDAY AT BEDTIME, Disp: 90 tablet, Rfl: 1   escitalopram (LEXAPRO) 20 MG tablet, TAKE 1 TABLET BY MOUTH EVERY DAY, Disp: 90 tablet, Rfl: 1   glipiZIDE (GLUCOTROL XL) 5 MG 24 hr tablet, Take 1  tablet (5 mg total) by mouth daily with breakfast., Disp: 30 tablet, Rfl: 6   loratadine (CLARITIN) 10 MG tablet, Take 10 mg by mouth daily., Disp: , Rfl:    Multiple Vitamins-Minerals (MULTIVITAMIN WITH MINERALS) tablet, Take 1 tablet by mouth daily., Disp: , Rfl:    Probiotic Product (PROBIOTIC DAILY PO), Take 1 tablet by mouth daily., Disp: , Rfl:    TART CHERRY PO, Take 1 tablet by mouth in the morning and at bedtime., Disp: , Rfl:    tiotropium (SPIRIVA HANDIHALER) 18 MCG inhalation capsule, INHALE 1 CAPSULE VIA HANDIHALER ONCE DAILY AT THE SAME TIME EVERY DAY, Disp: 30 capsule, Rfl: 5   Vitamin D, Ergocalciferol, (DRISDOL) 1.25 MG (50000 UNIT) CAPS capsule, TAKE 1 CAPSULE (50,000 UNITS TOTAL) BY MOUTH EVERY FRIDAY., Disp: 12 capsule, Rfl: 3   Observations/Objective: Height 5' 3"$  (1.6 m), weight 157 lb (71.2 kg).  Physical Exam Constitutional:      General: The patient is not in acute distress. Pulmonary:     Effort: Pulmonary effort is normal. No respiratory distress.  Neurological:     Mental Status: The patient is alert and oriented to person, place, and time.  Psychiatric:        Mood and Affect: Mood normal.        Behavior: Behavior normal.    Assessment and Plan Alzheimer's disease, unspecified (CODE) ([place]) Assessment & Plan:  Alzheimer's and vascular dementia.... Granddaughter states overall improvement in awareness level, less somnolence.  No behavioral issues.  Adequate sleep at night.   Occipital cerebral infarction [facility]) Assessment & Plan: Acute versus subacute per MRI.  MRI reviewed in detail with patient and granddaughter. Discussed possible statin initiation but given the desire for medication simplified regimen we will hold off on adding this. She is not a candidate for oral anticoagulation given history of repeated intracranial hemorrhage as well as new finding of possible chronic cerebral microhemorrhages.  We discussed consideration of referral to neurology  for further evaluation of the cerebral microhemorrhages but given limited expected benefit or change in treatment given age and other comorbidities, granddaughter and patient decided to hold off on any further referrals.  Of note patient states she wants as little medications and as little doctors as possible.   Follow up in 3 months for follow up multiple issues.    I discussed the assessment and treatment plan with the patient. The patient was provided an opportunity to ask questions and all were answered. The patient agreed with the plan and demonstrated an understanding of the instructions.   The patient was advised to call back or seek an in-person evaluation if the symptoms worsen or if the condition fails to improve as anticipated.     Jill Lofts, MD

## 2022-10-05 NOTE — Assessment & Plan Note (Signed)
Acute versus subacute per MRI.  MRI reviewed in detail with patient and granddaughter. Discussed possible statin initiation but given the desire for medication simplified regimen we will hold off on adding this. She is not a candidate for oral anticoagulation given history of repeated intracranial hemorrhage as well as new finding of possible chronic cerebral microhemorrhages.  We discussed consideration of referral to neurology for further evaluation of the cerebral microhemorrhages but given limited expected benefit or change in treatment given age and other comorbidities, granddaughter and patient decided to hold off on any further referrals.  Of note patient states she wants as little medications and as little doctors as possible.

## 2022-10-05 NOTE — Assessment & Plan Note (Signed)
Alzheimer's and vascular dementia.... Granddaughter states overall improvement in awareness level, less somnolence.  No behavioral issues.  Adequate sleep at night.

## 2022-10-06 ENCOUNTER — Ambulatory Visit: Admitting: Urology

## 2022-10-06 VITALS — BP 158/85 | HR 79 | Ht 63.0 in | Wt 172.0 lb

## 2022-10-06 DIAGNOSIS — N2889 Other specified disorders of kidney and ureter: Secondary | ICD-10-CM | POA: Diagnosis not present

## 2022-10-06 DIAGNOSIS — Z87440 Personal history of urinary (tract) infections: Secondary | ICD-10-CM

## 2022-10-06 DIAGNOSIS — N281 Cyst of kidney, acquired: Secondary | ICD-10-CM

## 2022-10-06 DIAGNOSIS — N3289 Other specified disorders of bladder: Secondary | ICD-10-CM | POA: Diagnosis not present

## 2022-10-10 ENCOUNTER — Telehealth: Payer: Self-pay | Admitting: Family Medicine

## 2022-10-10 NOTE — Telephone Encounter (Signed)
Spoke with Oris Drone and advised that I do not see any current orders on Jill [name].  I ask that she refax them to [phone] so the orders will come right here to my desk.

## 2022-10-10 NOTE — Telephone Encounter (Signed)
Jill [name] from [place] called about some orders she sent over last week and just wanted to make sure we received them and if we were gonna fill out. Callback is [phone]

## 2022-10-10 NOTE — Telephone Encounter (Signed)
Orders received and placed in Dr. Rometta Emery office in box to sign.

## 2022-10-14 ENCOUNTER — Other Ambulatory Visit: Payer: Self-pay | Admitting: Family Medicine

## 2022-10-16 ENCOUNTER — Encounter: Payer: Self-pay | Admitting: Urology

## 2022-12-09 ENCOUNTER — Other Ambulatory Visit: Payer: Self-pay | Admitting: Family Medicine

## 2023-01-10 ENCOUNTER — Ambulatory Visit: Payer: Medicare PPO | Admitting: Family Medicine

## 2023-01-10 VITALS — BP 102/64 | HR 93 | Temp 97.6°F

## 2023-01-10 DIAGNOSIS — E1122 Type 2 diabetes mellitus with diabetic chronic kidney disease: Secondary | ICD-10-CM

## 2023-01-10 DIAGNOSIS — R531 Weakness: Secondary | ICD-10-CM

## 2023-01-10 DIAGNOSIS — N183 Chronic kidney disease, stage 3 unspecified: Secondary | ICD-10-CM

## 2023-01-10 DIAGNOSIS — Z7984 Long term (current) use of oral hypoglycemic drugs: Secondary | ICD-10-CM | POA: Diagnosis not present

## 2023-01-10 LAB — COMPREHENSIVE METABOLIC PANEL
ALT: 8 U/L (ref 0–35)
AST: 17 U/L (ref 0–37)
Albumin: 3.7 g/dL (ref 3.5–5.2)
Alkaline Phosphatase: 83 U/L (ref 39–117)
BUN: 38 mg/dL — ABNORMAL HIGH (ref 6–23)
CO2: 28 mEq/L (ref 19–32)
Calcium: 9.3 mg/dL (ref 8.4–10.5)
Chloride: 103 mEq/L (ref 96–112)
Creatinine, Ser: 2.09 mg/dL — ABNORMAL HIGH (ref 0.40–1.20)
GFR: 20.71 mL/min — ABNORMAL LOW (ref >60.00)
Glucose, Bld: 305 mg/dL — ABNORMAL HIGH (ref 70–99)
Potassium: 4.9 mEq/L (ref 3.5–5.1)
Sodium: 139 mEq/L (ref 135–145)
Total Bilirubin: 0.4 mg/dL (ref 0.2–1.2)
Total Protein: 6.6 g/dL (ref 6.0–8.3)

## 2023-01-10 LAB — CBC WITH DIFFERENTIAL/PLATELET
Basophils Absolute: 0 10*3/uL (ref 0.0–0.1)
Basophils Relative: 0.5 % (ref 0.0–3.0)
Eosinophils Absolute: 0.1 10*3/uL (ref 0.0–0.7)
Eosinophils Relative: 1.2 % (ref 0.0–5.0)
HCT: 38.8 % (ref 36.0–46.0)
Hemoglobin: 12.6 g/dL (ref 12.0–15.0)
Lymphocytes Relative: 53.4 % — ABNORMAL HIGH (ref 12.0–46.0)
Lymphs Abs: 4.4 10*3/uL — ABNORMAL HIGH (ref 0.7–4.0)
MCHC: 32.5 g/dL (ref 30.0–36.0)
MCV: 90.9 fl (ref 78.0–100.0)
Monocytes Absolute: 0.8 10*3/uL (ref 0.1–1.0)
Monocytes Relative: 10.1 % (ref 3.0–12.0)
Neutro Abs: 2.9 10*3/uL (ref 1.4–7.7)
Neutrophils Relative %: 34.8 % — ABNORMAL LOW (ref 43.0–77.0)
Platelets: 229 10*3/uL (ref 150.0–400.0)
RBC: 4.27 Mil/uL (ref 3.87–5.11)
RDW: 14.6 % (ref 11.5–15.5)
WBC: 8.2 10*3/uL (ref 4.0–10.5)

## 2023-01-10 LAB — HEMOGLOBIN A1C: Hgb A1c MFr Bld: 7.6 % — ABNORMAL HIGH (ref 4.6–6.5)

## 2023-01-10 NOTE — Progress Notes (Unsigned)
Patient has been having weakness x 2-3 weeks give or take. She fell 3 times around mother's day and got some bruises. Also mentioned that her right leg has been bothering her. The family give her tylenol as needed when they know she is in pain. Family gave extra history, patient didn't recall recent events.  H/o dementia noted.  Patient had B arm and leg weakness, ie diffuse weakness but not focal weakness.  On hospice at baseline.  More confusion than baseline.  Rare use of glipizide. Recent sugar of ~300 after a higher sugar meal.  Usually lower 100s o/w.    No dysuria.  Some occ cough.  Not CP.

## 2023-01-10 NOTE — Patient Instructions (Signed)
Don't change your meds yet.   Try to drink more fluids, as tolerated.  Labs on the way out.  Take care.  Glad to see you.

## 2023-01-11 NOTE — Assessment & Plan Note (Signed)
I suspect this is diffuse weakness related to deconditioning and not an acute neuromuscular issue.  Discussed doing well-hydrated.  Reasonable to recheck her labs today.  See notes on labs.  At this point still okay for outpatient follow-up.  Family is attentive.  Update [place] as needed.  Routed to PCP as FYI.

## 2023-01-12 ENCOUNTER — Encounter: Payer: Self-pay | Admitting: Family Medicine

## 2023-01-16 ENCOUNTER — Encounter (HOSPITAL_BASED_OUTPATIENT_CLINIC_OR_DEPARTMENT_OTHER): Payer: Self-pay | Admitting: Emergency Medicine

## 2023-01-16 ENCOUNTER — Emergency Department (HOSPITAL_BASED_OUTPATIENT_CLINIC_OR_DEPARTMENT_OTHER): Payer: Medicare Other

## 2023-01-16 ENCOUNTER — Emergency Department (HOSPITAL_BASED_OUTPATIENT_CLINIC_OR_DEPARTMENT_OTHER)
Admission: EM | Admit: 2023-01-16 | Discharge: 2023-01-16 | Disposition: A | Discharge status: Home / Self Care | Payer: Medicare Other | Attending: Emergency Medicine | Admitting: Emergency Medicine

## 2023-01-16 ENCOUNTER — Other Ambulatory Visit: Payer: Self-pay

## 2023-01-16 DIAGNOSIS — I6782 Cerebral ischemia: Secondary | ICD-10-CM | POA: Diagnosis not present

## 2023-01-16 DIAGNOSIS — N3000 Acute cystitis without hematuria: Secondary | ICD-10-CM | POA: Diagnosis not present

## 2023-01-16 DIAGNOSIS — R5381 Other malaise: Secondary | ICD-10-CM

## 2023-01-16 DIAGNOSIS — G319 Degenerative disease of nervous system, unspecified: Secondary | ICD-10-CM | POA: Diagnosis not present

## 2023-01-16 DIAGNOSIS — Z8673 Personal history of transient ischemic attack (TIA), and cerebral infarction without residual deficits: Secondary | ICD-10-CM | POA: Diagnosis not present

## 2023-01-16 DIAGNOSIS — R5383 Other fatigue: Secondary | ICD-10-CM | POA: Diagnosis not present

## 2023-01-16 DIAGNOSIS — E860 Dehydration: Secondary | ICD-10-CM

## 2023-01-16 DIAGNOSIS — R030 Elevated blood-pressure reading, without diagnosis of hypertension: Secondary | ICD-10-CM

## 2023-01-16 DIAGNOSIS — F0390 Unspecified dementia without behavioral disturbance: Secondary | ICD-10-CM | POA: Diagnosis not present

## 2023-01-16 HISTORY — DX: Unspecified dementia, unspecified severity, without behavioral disturbance, psychotic disturbance, mood disturbance, and anxiety: F03.90

## 2023-01-16 LAB — CBC WITH DIFFERENTIAL/PLATELET
Abs Immature Granulocytes: 0.07 10*3/uL (ref 0.00–0.07)
Basophils Absolute: 0 10*3/uL (ref 0.0–0.1)
Basophils Relative: 0 %
Eosinophils Absolute: 0.1 10*3/uL (ref 0.0–0.5)
Eosinophils Relative: 1 %
HCT: 37.1 % (ref 36.0–46.0)
Hemoglobin: 12.1 g/dL (ref 12.0–15.0)
Immature Granulocytes: 1 %
Lymphocytes Relative: 59 %
Lymphs Abs: 5.5 10*3/uL — ABNORMAL HIGH (ref 0.7–4.0)
MCH: 29.9 pg (ref 26.0–34.0)
MCHC: 32.6 g/dL (ref 30.0–36.0)
MCV: 91.6 fL (ref 80.0–100.0)
Monocytes Absolute: 0.9 10*3/uL (ref 0.1–1.0)
Monocytes Relative: 9 %
Neutro Abs: 2.8 10*3/uL (ref 1.7–7.7)
Neutrophils Relative %: 30 %
Platelets: 181 10*3/uL (ref 150–400)
RBC: 4.05 MIL/uL (ref 3.87–5.11)
RDW: 13.8 % (ref 11.5–15.5)
Smear Review: NORMAL
WBC: 9.6 10*3/uL (ref 4.0–10.5)
nRBC: 0 % (ref 0.0–0.2)

## 2023-01-16 LAB — URINALYSIS, ROUTINE W REFLEX MICROSCOPIC
Bilirubin Urine: NEGATIVE
Glucose, UA: NEGATIVE mg/dL
Hgb urine dipstick: NEGATIVE
Ketones, ur: NEGATIVE mg/dL
Nitrite: POSITIVE — AB
Specific Gravity, Urine: 1.014 (ref 1.005–1.030)
pH: 5.5 (ref 5.0–8.0)

## 2023-01-16 LAB — COMPREHENSIVE METABOLIC PANEL
ALT: 6 U/L (ref 0–44)
AST: 14 U/L — ABNORMAL LOW (ref 15–41)
Albumin: 3.6 g/dL (ref 3.5–5.0)
Alkaline Phosphatase: 73 U/L (ref 38–126)
Anion gap: 9 (ref 5–15)
BUN: 37 mg/dL — ABNORMAL HIGH (ref 8–23)
CO2: 26 mmol/L (ref 22–32)
Calcium: 8.8 mg/dL — ABNORMAL LOW (ref 8.9–10.3)
Chloride: 106 mmol/L (ref 98–111)
Creatinine, Ser: 1.98 mg/dL — ABNORMAL HIGH (ref 0.44–1.00)
GFR, Estimated: 24 mL/min — ABNORMAL LOW (ref >60)
Glucose, Bld: 139 mg/dL — ABNORMAL HIGH (ref 70–99)
Potassium: 4.2 mmol/L (ref 3.5–5.1)
Sodium: 141 mmol/L (ref 135–145)
Total Bilirubin: 0.4 mg/dL (ref 0.3–1.2)
Total Protein: 6.3 g/dL — ABNORMAL LOW (ref 6.5–8.1)

## 2023-01-16 LAB — TROPONIN I (HIGH SENSITIVITY): Troponin I (High Sensitivity): 10 ng/L (ref <18)

## 2023-01-16 LAB — LIPASE, BLOOD: Lipase: <10 U/L — ABNORMAL LOW (ref 11–51)

## 2023-01-16 LAB — LACTIC ACID, PLASMA: Lactic Acid, Venous: 0.8 mmol/L (ref 0.5–1.9)

## 2023-01-16 LAB — BRAIN NATRIURETIC PEPTIDE: B Natriuretic Peptide: 139.4 pg/mL — ABNORMAL HIGH (ref 0.0–100.0)

## 2023-01-16 MED ORDER — AMLODIPINE BESYLATE 5 MG PO TABS: 5.0000 mg | ORAL_TABLET | Freq: Every day | ORAL | 1 refills | Status: DC

## 2023-01-16 MED ORDER — LEVOFLOXACIN 250 MG PO TABS: 250.0000 mg | ORAL_TABLET | Freq: Every day | ORAL | 0 refills | Status: AC

## 2023-01-16 MED ORDER — LEVOFLOXACIN 500 MG PO TABS
250.0000 mg | ORAL_TABLET | Freq: Once | ORAL | Status: AC
  Administered 2023-01-16: 250 mg via ORAL
  Filled 2023-01-16: qty 1

## 2023-01-16 MED ORDER — HYDRALAZINE HCL 20 MG/ML IJ SOLN
5.0000 mg | Freq: Once | INTRAMUSCULAR | Status: AC
  Administered 2023-01-16: 5 mg via INTRAVENOUS
  Filled 2023-01-16: qty 1

## 2023-01-16 MED ORDER — LACTATED RINGERS IV SOLN: INTRAVENOUS | Status: DC

## 2023-01-16 MED ORDER — LACTATED RINGERS IV BOLUS
500.0000 mL | Freq: Once | INTRAVENOUS | Status: AC
  Administered 2023-01-16: 500 mL via INTRAVENOUS

## 2023-01-16 NOTE — ED Provider Notes (Signed)
[place] EMERGENCY DEPARTMENT AT [facility] Provider Note   CSN: [id number] Arrival date & time: 01/16/23  1110     History  No chief complaint on file.   Jill [name] is a 89 y.o. female.  HPI Patient has several falls at the beginning of the month.  She has gotten increasingly weak generally and slightly more confused than baseline.  At baseline patient does have some dementia but is interactive with family members and parrticipating in self-care.  She was seen at PCP office last week and labs were done.  Patient's granddaughter reports that labs showed dehydration and they have been trying to increase fluids at home but the patient is having difficulty eating and drinking is much as she should and she is continue to get more weak and slightly confused.  They have not documented any fevers.  Patient denies she has any pain.  She reports only place she seems to have some pain is in her great toes.    Home Medications Prior to Admission medications   Medication Sig Start Date End Date Taking? Authorizing Provider  levofloxacin (LEVAQUIN) 250 MG tablet Take 1 tablet (250 mg total) by mouth daily for 2 days. 01/17/23 01/19/23 Yes Arby Barrette, MD  acetaminophen (TYLENOL) 500 MG tablet Take 1,000 mg by mouth every 6 (six) hours as needed for moderate pain or headache.    [provider]  albuterol (VENTOLIN HFA) 108 (90 Base) MCG/ACT inhaler Inhale 2 puffs into the lungs every 6 (six) hours as needed for wheezing or shortness of breath. 09/02/21   Eden Emms, NP  allopurinol (ZYLOPRIM) 100 MG tablet TAKE 1/2 TABLET BY MOUTH EVERY OTHER DAY 12/09/22   Bedsole, Amy E, MD  Cranberry 500 MG CAPS Take 1 capsule by mouth daily.    [provider]  donepezil (ARICEPT) 10 MG tablet TAKE 1 TABLET BY MOUTH EVERYDAY AT BEDTIME 10/14/22   Bedsole, Amy E, MD  escitalopram (LEXAPRO) 20 MG tablet TAKE 1 TABLET BY MOUTH EVERY DAY 09/30/22   Bedsole, Amy E, MD  glipiZIDE  (GLUCOTROL XL) 5 MG 24 hr tablet Take 1 tablet (5 mg total) by mouth daily with breakfast. 08/26/22   Bedsole, Amy E, MD  loratadine (CLARITIN) 10 MG tablet Take 10 mg by mouth daily.    [provider]  Multiple Vitamins-Minerals (MULTIVITAMIN WITH MINERALS) tablet Take 1 tablet by mouth daily.    [provider]  OXYGEN Inhale into the lungs.    [provider]  Probiotic Product (PROBIOTIC DAILY PO) Take 1 tablet by mouth daily.    [provider]  TART CHERRY PO Take 1 tablet by mouth in the morning and at bedtime.    [provider]  tiotropium (SPIRIVA HANDIHALER) 18 MCG inhalation capsule INHALE 1 CAPSULE VIA HANDIHALER ONCE DAILY AT THE SAME TIME EVERY DAY 09/16/20   Bedsole, Amy E, MD  Vitamin D, Ergocalciferol, (DRISDOL) 1.25 MG (50000 UNIT) CAPS capsule TAKE 1 CAPSULE (50,000 UNITS TOTAL) BY MOUTH EVERY FRIDAY. 04/08/22   Excell Seltzer, MD      Allergies    Lovastatin, Rocephin [ceftriaxone], Statins, Sulfa antibiotics, Codeine, and Niacin    Review of Systems   Review of Systems  Physical Exam Updated Vital Signs BP (!) 209/88   Pulse 63   Temp 98.1 F (36.7 C) (Oral)   Resp 15   SpO2 93%  Physical Exam Constitutional:      Comments: Patient is alert.  Nontoxic  in appearance.  No respiratory distress at rest.  HENT:     Head: Normocephalic and atraumatic.     Mouth/Throat:     Mouth: Mucous membranes are dry.     Pharynx: Oropharynx is clear.  Eyes:     Extraocular Movements: Extraocular movements intact.  Cardiovascular:     Rate and Rhythm: Normal rate and regular rhythm.  Pulmonary:     Comments: No respiratory distress.  Slightly decreased breath sounds at the right bases with some crackle. Abdominal:     General: There is no distension.     Palpations: Abdomen is soft.     Tenderness: There is no abdominal tenderness. There is no guarding.  Musculoskeletal:     Cervical back: Neck supple.     Comments: No  peripheral edema.  Condition of the feet and the lower legs is very good.  There are no wounds.  The feet are warm and dry.  The dorsalis pedis pulses are 2+ and strong.  The toes are normal in appearance.    Skin:    General: Skin is warm and dry.  Neurological:     Comments: Patient is alert.  She is interactive.  Her speech is clear.  No focal motor deficits.  Psychiatric:        Mood and Affect: Mood normal.     ED Results / Procedures / Treatments   Labs (all labs ordered are listed, but only abnormal results are displayed) Labs Reviewed  BRAIN NATRIURETIC PEPTIDE - Abnormal; Notable for the following components:      Result Value   B Natriuretic Peptide 139.4 (*)    All other components within normal limits  COMPREHENSIVE METABOLIC PANEL - Abnormal; Notable for the following components:   Glucose, Bld 139 (*)    BUN 37 (*)    Creatinine, Ser 1.98 (*)    Calcium 8.8 (*)    Total Protein 6.3 (*)    AST 14 (*)    GFR, Estimated 24 (*)    All other components within normal limits  LIPASE, BLOOD - Abnormal; Notable for the following components:   Lipase <10 (*)    All other components within normal limits  CBC WITH DIFFERENTIAL/PLATELET - Abnormal; Notable for the following components:   Lymphs Abs 5.5 (*)    All other components within normal limits  URINALYSIS, ROUTINE W REFLEX MICROSCOPIC - Abnormal; Notable for the following components:   Protein, ur TRACE (*)    Nitrite POSITIVE (*)    Leukocytes,Ua SMALL (*)    Bacteria, UA RARE (*)    All other components within normal limits  LACTIC ACID, PLASMA  LACTIC ACID, PLASMA  TROPONIN I (HIGH SENSITIVITY)  TROPONIN I (HIGH SENSITIVITY)    EKG EKG Interpretation  Date/Time:  Monday Jan 16 2023 13:28:12 EDT Ventricular Rate:  63 PR Interval:  150 QRS Duration: 93 QT Interval:  424 QTC Calculation: 434 R Axis:   53 Text Interpretation: Sinus rhythm Abnormal T, consider ischemia, diffuse leads lateral t wave  inverson since pre Confirmed by Arby Barrette [phone]) on 01/16/2023 3:42:29 PM  Radiology CT Head Wo Contrast  Result Date: 01/16/2023 CLINICAL DATA:  Head trauma, minor (Age >= 65y). Worsening weakness and confusion. Recent falls. EXAM: CT HEAD WITHOUT CONTRAST TECHNIQUE: Contiguous axial images were obtained from the base of the skull through the vertex without intravenous contrast. RADIATION DOSE REDUCTION: This exam was performed according to the departmental dose-optimization program which includes automated exposure control, adjustment  of the mA and/or kV according to patient size and/or use of iterative reconstruction technique. COMPARISON:  Head CT 08/05/2022 and MRI 10/01/2022 FINDINGS: Brain: There is no evidence of an acute infarct, intracranial hemorrhage, mass, midline shift, or extra-axial fluid collection. Confluent hypodensities in the cerebral white matter bilaterally are similar to the prior CT and are nonspecific but compatible with extensive chronic small vessel ischemic disease. A chronic lacunar infarct is again noted in the right basal ganglia. There is moderate cerebral atrophy. Vascular: Calcified atherosclerosis at the skull base. No hyperdense vessel. Skull: No fracture.  Right parietal skull hemangioma. Sinuses/Orbits: Bilateral cataract extraction. Small volume fluid in the left sphenoid sinus. Clear mastoid air cells. Other: None. IMPRESSION: 1. No evidence of acute intracranial abnormality. 2. Extensive chronic small vessel ischemic disease. Electronically Signed   By: Sebastian Ache M.D.   On: 01/16/2023 14:34   DG Chest Port 1 View  Result Date: 01/16/2023 CLINICAL DATA:  weakness EXAM: PORTABLE CHEST - 1 VIEW COMPARISON:  08/05/2022 FINDINGS: Lungs are clear, mildly hyperinflated with a attenuated mildly coarse bronchovascular markings. Heart size and mediastinal contours are within normal limits. Aortic Atherosclerosis (ICD10-170.0). No effusion. Visualized bones  unremarkable. IMPRESSION: No acute cardiopulmonary disease. Electronically Signed   By: Corlis Leak M.D.   On: 01/16/2023 14:11    Procedures Procedures    Medications Ordered in ED Medications  lactated ringers infusion ( Intravenous New Bag/Given 01/16/23 1335)  levofloxacin (LEVAQUIN) tablet 250 mg (has no administration in time range)  hydrALAZINE (APRESOLINE) injection 5 mg (has no administration in time range)  lactated ringers bolus 500 mL (500 mLs Intravenous New Bag/Given 01/16/23 1524)    ED Course/ Medical Decision Making/ A&P                             Medical Decision Making Amount and/or Complexity of Data Reviewed Labs: ordered. Radiology: ordered.  Risk Prescription drug management.   Patient presents as outlined with general decline and failure to thrive over the past several weeks.  This seemed to start with a few falls.  And then confusion greater than baseline.  At baseline patient is interactive with family and alert.  Differential diagnosis includes intracranial hemorrhage\metabolic derangement\infectious etiology.\ACS.  Will start with diagnostic evaluation including CT head for possible subacute subdural and initiate IV fluid replacement.  Review of EMR indicates patient did have decreasing GFR when checked on outpatient basis last week.  Last week creatinine was 2.09, over the past 5 months has been closer to 1.8 although previously she also had creatinines at 2 or above.  CT head no acute findings.  Urinalysis positive for nitrites and white cells.  Will opt to treat.  Patient has Rocephin allergy and GFR mildly elevated for Macrobid.  Will opt to give Levaquin 250 mg p.o.  No leukocytosis.  Normal white count.  Lactic acid 0.8.  Creatinine 1.98 BUN 37.  BNP 139  Patient's blood pressures have elevated since arrival.  Patient's granddaughter reports that typically she does not have significant hypertension.  Will continue fluid bolus and assess.  Patient remains  asymptomatic with hypertension at this time.  Dr. Wallace Cullens to reassess after hydration for anticipated discharge.        Final Clinical Impression(s) / ED Diagnoses Final diagnoses:  Malaise  Dehydration  Acute cystitis without hematuria    Rx / DC Orders ED Discharge Orders  Ordered    levofloxacin (LEVAQUIN) 250 MG tablet  Daily        01/16/23 1519              Arby Barrette, MD 01/16/23 1545

## 2023-01-16 NOTE — ED Notes (Signed)
Dc instructions reviewed with patient. Patient voiced understanding. Dc with belongings.  °

## 2023-01-16 NOTE — Discharge Instructions (Addendum)
1.  Try to stay hydrated as best as you can. 2.  Do not take your glipizide (Glucotrol XL) for the next couple of days while you are taking the Levaquin.  Monitor your blood sugars at home to see if they are elevating. 3.  Return to the emergency department if you are getting new or worsening symptoms. 4.  See your doctor for recheck within the next several days to monitor your progress.   It was a pleasure caring for you today in the emergency department.  Please return to the emergency department for any worsening or worrisome symptoms.

## 2023-01-16 NOTE — ED Triage Notes (Signed)
Pt fell 3 times a few weeks ago, and since then she has been really weak, more confusion than her baseline (dementia). Primary drew labs this week, told she was dehydrated and try to push fluids, pt not able to drink enough. Pts granddaughter describes her as normally perky and she is almost somnolent in triage.

## 2023-01-16 NOTE — ED Provider Notes (Addendum)
  Provider Note MRN:  [id number]  Arrival date & time: 01/16/23    ED Course and Medical Decision Making  Assumed care from M Pfeiffer at shift change.  See note from prior team for complete details, in brief:  89 yo female Fatigue Found to have UTI IVF and oral abx pending BP elev Labs stable Cr similar to prior  Plan per prior physician give fluids, oral abx  Clinical Course as of 01/16/23 1712  Mon Jan 16, 2023  1602 Patient report significant premature symptoms on recheck.  She is able to tolerate Levaquin without difficulty.  No chest pain, palpitations, lightheadedness, numbness or tingling seems new.  I have low suspicion for hypertensive emergency at this time.  Does not take blood pressure medication at home, denies history of high blood pressure in the past.  Will give antihypertensive and recheck.  Again she is reporting significant improvement in symptoms following IV fluids. [SG]  1657 BP(!): 186/82 Improved [SG]    Clinical Course User Index [SG] Tanda Rockers A, DO   Blood pressure has improved, her symptoms is improved, she is feeling much better overall.  Tolerating p.o. without difficulty She does not appear to have evidence of endorgan damage or hypertensive emergency at this time.  Blood pressure improved with antihypertensive.  Will start oral antihypertensive and have patient follow-up with her PCP for further evaluation.   The patient improved significantly and was discharged in stable condition. Detailed discussions were had with the patient regarding current findings, and need for close f/u with PCP or on call doctor. The patient has been instructed to return immediately if the symptoms worsen in any way for re-evaluation. Patient verbalized understanding and is in agreement with current care plan. All questions answered prior to discharge.       Procedures  Final Clinical Impressions(s) / ED Diagnoses     ICD-10-CM   1. Malaise  R53.81     2.  Dehydration  E86.0     3. Acute cystitis without hematuria  N30.00     4. Elevated blood pressure reading  R03.0       ED Discharge Orders          Ordered    levofloxacin (LEVAQUIN) 250 MG tablet  Daily        01/16/23 1519    amLODipine (NORVASC) 5 MG tablet  Daily        01/16/23 1709              Discharge Instructions      1.  Try to stay hydrated as best as you can. 2.  Do not take your glipizide (Glucotrol XL) for the next couple of days while you are taking the Levaquin.  Monitor your blood sugars at home to see if they are elevating. 3.  Return to the emergency department if you are getting new or worsening symptoms. 4.  See your doctor for recheck within the next several days to monitor your progress.   It was a pleasure caring for you today in the emergency department.  Please return to the emergency department for any worsening or worrisome symptoms.        Sloan Leiter, DO 01/16/23 1710    Tanda Rockers A, DO 01/16/23 1712

## 2023-01-17 ENCOUNTER — Encounter: Payer: Self-pay | Admitting: Family Medicine

## 2023-01-18 ENCOUNTER — Ambulatory Visit: Payer: Medicare PPO | Admitting: Family Medicine

## 2023-01-18 ENCOUNTER — Encounter: Payer: Self-pay | Admitting: Family Medicine

## 2023-01-18 VITALS — BP 126/58 | HR 92 | Temp 97.7°F | Ht 62.0 in

## 2023-01-18 DIAGNOSIS — R030 Elevated blood-pressure reading, without diagnosis of hypertension: Secondary | ICD-10-CM | POA: Diagnosis not present

## 2023-01-18 DIAGNOSIS — N390 Urinary tract infection, site not specified: Secondary | ICD-10-CM

## 2023-01-18 DIAGNOSIS — E1122 Type 2 diabetes mellitus with diabetic chronic kidney disease: Secondary | ICD-10-CM | POA: Diagnosis not present

## 2023-01-18 DIAGNOSIS — I152 Hypertension secondary to endocrine disorders: Secondary | ICD-10-CM

## 2023-01-18 DIAGNOSIS — G309 Alzheimer's disease, unspecified: Secondary | ICD-10-CM | POA: Diagnosis not present

## 2023-01-18 DIAGNOSIS — R531 Weakness: Secondary | ICD-10-CM

## 2023-01-18 DIAGNOSIS — N183 Chronic kidney disease, stage 3 unspecified: Secondary | ICD-10-CM

## 2023-01-18 DIAGNOSIS — N184 Chronic kidney disease, stage 4 (severe): Secondary | ICD-10-CM

## 2023-01-18 DIAGNOSIS — E1159 Type 2 diabetes mellitus with other circulatory complications: Secondary | ICD-10-CM

## 2023-01-18 NOTE — Patient Instructions (Signed)
Follow blood sugar and blood pressure at home.  If blood sugar elevated.. call and we can try a different medication than Gluctrol XL  given it can cause lows.  Be wary bout the amlodipine and check BP prior to administering.  Keep up with food and fluids.  Call if interested in medication addition for  memory  such as Namenda.

## 2023-01-18 NOTE — Progress Notes (Signed)
Patient ID: Jill [name], female    DOB: 06-16-1932, 89 y.o.   MRN: [id number]  This visit was conducted in person.  BP (!) 126/58 (BP Location: Left Arm, Patient Position: Sitting, Cuff Size: Normal)   Pulse 92   Temp 97.7 F (36.5 C) (Temporal)   Ht 5\' 2"  (1.575 m)   SpO2 95%   BMI 31.46 kg/m    CC:  Chief Complaint  Patient presents with   ER Follow Up    Subjective:   HPI: Jill [name] is a 89 y.o. female presenting on 01/18/2023 for ER Follow Up  Emergency room visit on Jan 16, 2023 for fatigue and weakness.  Found to have urinary tract infection.  Treated with IV fluids and oral antibiotics  Started on Levaquin 250 mg daily x 2 days. Urine culture returned showing less than 100,000 colonies of gram-negative rods.  Final pending Of note blood pressure was elevated during hospitalization she was started on amlodipine 5 mg daily BP Readings from Last 3 Encounters:  01/18/23 (!) 126/58  01/16/23 (!) 186/82  01/10/23 102/64    Reviewed CBC: Hemoglobin and white blood cells within normal range, troponin I normal,  Normal lipase, lactic acid.  Complete metabolic panel showed glucose 784, creatinine 198 which is stable for her.  Calcium slightly low at 8.8. BNP minimally elevated at 139   She has fallen several times... not sure cause, not witnessed.  Legs give out quickly.   No recent weights at home. BS at home 120  01/10/23 BS was 305.. but did have peach cobbler. Wt Readings from Last 3 Encounters:  10/06/22 172 lb (78 kg)  10/05/22 157 lb (71.2 kg)  08/31/22 172 lb (78 kg)    Diabetes: Tolerable control on Glucotrol XL 5 mg daily Lab Results  Component Value Date   HGBA1C 7.6 (H) 01/10/2023  Using medications without difficulties: Hypoglycemic episodes: Hyperglycemic episodes: Feet problems: Blood Sugars averaging: eye exam within last year:    Family has  decided to stick with current hospice provider.   Relevant past medical, surgical, family  and social history reviewed and updated as indicated. Interim medical history since our last visit reviewed. Allergies and medications reviewed and updated. Outpatient Medications Prior to Visit  Medication Sig Dispense Refill   acetaminophen (TYLENOL) 500 MG tablet Take 1,000 mg by mouth every 6 (six) hours as needed for moderate pain or headache.     albuterol (VENTOLIN HFA) 108 (90 Base) MCG/ACT inhaler Inhale 2 puffs into the lungs every 6 (six) hours as needed for wheezing or shortness of breath. 8 g 0   allopurinol (ZYLOPRIM) 100 MG tablet TAKE 1/2 TABLET BY MOUTH EVERY OTHER DAY 21 tablet 2   amLODipine (NORVASC) 5 MG tablet Take 1 tablet (5 mg total) by mouth daily. 30 tablet 1   Cranberry 500 MG CAPS Take 1 capsule by mouth daily.     donepezil (ARICEPT) 10 MG tablet TAKE 1 TABLET BY MOUTH EVERYDAY AT BEDTIME 90 tablet 1   escitalopram (LEXAPRO) 20 MG tablet TAKE 1 TABLET BY MOUTH EVERY DAY 90 tablet 1   levofloxacin (LEVAQUIN) 250 MG tablet Take 1 tablet (250 mg total) by mouth daily for 2 days. 2 tablet 0   loratadine (CLARITIN) 10 MG tablet Take 10 mg by mouth daily.     Multiple Vitamins-Minerals (MULTIVITAMIN WITH MINERALS) tablet Take 1 tablet by mouth daily.     OXYGEN Inhale into the lungs.  Probiotic Product (PROBIOTIC DAILY PO) Take 1 tablet by mouth daily.     TART CHERRY PO Take 1 tablet by mouth in the morning and at bedtime.     tiotropium (SPIRIVA HANDIHALER) 18 MCG inhalation capsule INHALE 1 CAPSULE VIA HANDIHALER ONCE DAILY AT THE SAME TIME EVERY DAY 30 capsule 5   Vitamin D, Ergocalciferol, (DRISDOL) 1.25 MG (50000 UNIT) CAPS capsule TAKE 1 CAPSULE (50,000 UNITS TOTAL) BY MOUTH EVERY FRIDAY. 12 capsule 3   glipiZIDE (GLUCOTROL XL) 5 MG 24 hr tablet Take 1 tablet (5 mg total) by mouth daily with breakfast. 30 tablet 6   No facility-administered medications prior to visit.     Per HPI unless specifically indicated in ROS section below Review of Systems   Constitutional:  Positive for fatigue. Negative for fever.  HENT:  Negative for congestion.   Eyes:  Negative for pain.  Respiratory:  Negative for cough and shortness of breath.   Cardiovascular:  Negative for chest pain, palpitations and leg swelling.  Gastrointestinal:  Negative for abdominal pain.  Genitourinary:  Negative for dysuria and vaginal bleeding.  Musculoskeletal:  Negative for back pain.  Neurological:  Positive for weakness. Negative for syncope, light-headedness and headaches.  Psychiatric/Behavioral:  Negative for dysphoric mood.    Objective:  BP (!) 126/58 (BP Location: Left Arm, Patient Position: Sitting, Cuff Size: Normal)   Pulse 92   Temp 97.7 F (36.5 C) (Temporal)   Ht 5\' 2"  (1.575 m)   SpO2 95%   BMI 31.46 kg/m   Wt Readings from Last 3 Encounters:  10/06/22 172 lb (78 kg)  10/05/22 157 lb (71.2 kg)  08/31/22 172 lb (78 kg)      Physical Exam Constitutional:      General: She is not in acute distress.    Appearance: Normal appearance. She is well-developed. She is not ill-appearing or toxic-appearing.  HENT:     Head: Normocephalic.     Right Ear: Hearing, tympanic membrane, ear canal and external ear normal. Tympanic membrane is not erythematous, retracted or bulging.     Left Ear: Hearing, tympanic membrane, ear canal and external ear normal. Tympanic membrane is not erythematous, retracted or bulging.     Nose: No mucosal edema or rhinorrhea.     Right Sinus: No maxillary sinus tenderness or frontal sinus tenderness.     Left Sinus: No maxillary sinus tenderness or frontal sinus tenderness.     Mouth/Throat:     Pharynx: Uvula midline.  Eyes:     General: Lids are normal. Lids are everted, no foreign bodies appreciated.     Conjunctiva/sclera: Conjunctivae normal.     Pupils: Pupils are equal, round, and reactive to light.  Neck:     Thyroid: No thyroid mass or thyromegaly.     Vascular: No carotid bruit.     Trachea: Trachea normal.   Cardiovascular:     Rate and Rhythm: Normal rate and regular rhythm.     Pulses: Normal pulses.     Heart sounds: Normal heart sounds, S1 normal and S2 normal. No murmur heard.    No friction rub. No gallop.  Pulmonary:     Effort: Pulmonary effort is normal. No tachypnea or respiratory distress.     Breath sounds: Normal breath sounds. No decreased breath sounds, wheezing, rhonchi or rales.  Abdominal:     General: Bowel sounds are normal.     Palpations: Abdomen is soft.     Tenderness: There is no abdominal tenderness.  Musculoskeletal:     Cervical back: Normal range of motion and neck supple.  Skin:    General: Skin is warm and dry.     Findings: No rash.  Neurological:     Mental Status: She is alert. She is disoriented.     Cranial Nerves: Cranial nerves 2-12 are intact.     Sensory: Sensation is intact.     Motor: Weakness present.     Coordination: Coordination abnormal.     Gait: Gait abnormal.     Comments: Unable to stand on her own, in wheelchair in office  Psychiatric:        Mood and Affect: Mood is not anxious or depressed.        Speech: Speech normal.        Behavior: Behavior normal. Behavior is cooperative.        Thought Content: Thought content normal.        Judgment: Judgment normal.       Results for orders placed or performed during the hospital encounter of 01/16/23  Urine Culture   Specimen: Urine, Clean Catch  Result Value Ref Range   Specimen Description      URINE, CLEAN CATCH Performed at [facility], [address] [place], [place] [address]    Special Requests      NONE Performed at [facility], [address] [place], [place] [address]    Culture 60,000 COLONIES/mL GRAM NEGATIVE RODS (A)    Report Status PENDING   Brain natriuretic peptide  Result Value Ref Range   B Natriuretic Peptide 139.4 (H) 0.0 - 100.0 pg/mL  Comprehensive metabolic panel  Result Value Ref Range   Sodium 141 135  - 145 mmol/L   Potassium 4.2 3.5 - 5.1 mmol/L   Chloride 106 98 - 111 mmol/L   CO2 26 22 - 32 mmol/L   Glucose, Bld 139 (H) 70 - 99 mg/dL   BUN 37 (H) 8 - 23 mg/dL   Creatinine, Ser 0.98 (H) 0.44 - 1.00 mg/dL   Calcium 8.8 (L) 8.9 - 10.3 mg/dL   Total Protein 6.3 (L) 6.5 - 8.1 g/dL   Albumin 3.6 3.5 - 5.0 g/dL   AST 14 (L) 15 - 41 U/L   ALT 6 0 - 44 U/L   Alkaline Phosphatase 73 38 - 126 U/L   Total Bilirubin 0.4 0.3 - 1.2 mg/dL   GFR, Estimated 24 (L) >60 mL/min   Anion gap 9 5 - 15  Lactic acid, plasma  Result Value Ref Range   Lactic Acid, Venous 0.8 0.5 - 1.9 mmol/L  Lipase, blood  Result Value Ref Range   Lipase <10 (L) 11 - 51 U/L  CBC with Differential  Result Value Ref Range   WBC 9.6 4.0 - 10.5 K/uL   RBC 4.05 3.87 - 5.11 MIL/uL   Hemoglobin 12.1 12.0 - 15.0 g/dL   HCT 11.9 14.7 - 82.9 %   MCV 91.6 80.0 - 100.0 fL   MCH 29.9 26.0 - 34.0 pg   MCHC 32.6 30.0 - 36.0 g/dL   RDW 56.2 13.0 - 86.5 %   Platelets 181 150 - 400 K/uL   nRBC 0.0 0.0 - 0.2 %   Neutrophils Relative % 30 %   Neutro Abs 2.8 1.7 - 7.7 K/uL   Lymphocytes Relative 59 %   Lymphs Abs 5.5 (H) 0.7 - 4.0 K/uL   Monocytes Relative 9 %   Monocytes Absolute 0.9 0.1 - 1.0  K/uL   Eosinophils Relative 1 %   Eosinophils Absolute 0.1 0.0 - 0.5 K/uL   Basophils Relative 0 %   Basophils Absolute 0.0 0.0 - 0.1 K/uL   RBC Morphology MORPHOLOGY UNREMARKABLE    Smear Review Normal platelet morphology    Immature Granulocytes 1 %   Abs Immature Granulocytes 0.07 0.00 - 0.07 K/uL   Abnormal Lymphocytes Present PRESENT    Smudge Cells PRESENT   Urinalysis, Routine w reflex microscopic -Urine, Clean Catch  Result Value Ref Range   Color, Urine YELLOW YELLOW   APPearance CLEAR CLEAR   Specific Gravity, Urine 1.014 1.005 - 1.030   pH 5.5 5.0 - 8.0   Glucose, UA NEGATIVE NEGATIVE mg/dL   Hgb urine dipstick NEGATIVE NEGATIVE   Bilirubin Urine NEGATIVE NEGATIVE   Ketones, ur NEGATIVE NEGATIVE mg/dL   Protein,  ur TRACE (A) NEGATIVE mg/dL   Nitrite POSITIVE (A) NEGATIVE   Leukocytes,Ua SMALL (A) NEGATIVE   RBC / HPF 0-5 0 - 5 RBC/hpf   WBC, UA 11-20 0 - 5 WBC/hpf   Bacteria, UA RARE (A) NONE SEEN   Squamous Epithelial / HPF 0-5 0 - 5 /HPF  Troponin I (High Sensitivity)  Result Value Ref Range   Troponin I (High Sensitivity) 10 <18 ng/L    Assessment and Plan  Elevated blood pressure reading  Alzheimer's disease, unspecified (CODE) (HCC) Assessment & Plan: Chronic, some mild decline over time despite Aricept 10 mg daily.  We did discuss increasing Aricept to 23 mg daily although I am hesitant given it may affect her appetite.  We also discussed possible addition of Namenda.  They will discuss.  Of note patient would not like any further medication at this time.   Recurrent UTI  Type 2 diabetes mellitus with stage 3 chronic kidney disease, without long-term current use of insulin, unspecified whether stage 3a or 3b CKD (HCC) Assessment & Plan: Chronic, previously tolerable control on no medication.  We did discuss how blood sugar elevations after meals could be contributing to her dehydration.  They will continue to work on avoiding concentrated sweets while still encouraging her nutritional intake.  We did discuss Glucotrol XL and possible hypoglycemia associated.  We will remove this from her medication list and I have encouraged them to not use it given possible associated hypoglycemia and weakness.  If blood sugars at home continue to be elevated after meals we will discuss new medication to treat.   CKD (chronic kidney disease) stage 4, GFR 15-29 ml/min (HCC) Assessment & Plan: Chronic, recent further decline in chronic kidney function likely secondary to dehydration.  She is now improving her fluid intake and is status post IV fluids. Normal urine output per family members.  Encouraged family to continue following up with renal for management.   Hypertension associated with  diabetes [facility]) Assessment & Plan: Chronic, blood pressure usually very well-controlled on no medication.  More recently low normal suggesting dehydration.  In the emergency room blood pressure was elevated.  Family has been checking blood pressure and only administering amlodipine 5 mg daily if it is above 150/90.  I encouraged them to continue to wafortch for hypertension.   Weakness Assessment & Plan: Acute, nonspecific weakness and malaise leading to ER visit now improved slightly.  No true urinary tract infection given urine culture returned normal.  They have completed antibiotics. Weakness may have been due to decreased p.o. and water intake.     Return in about 6 weeks (around 03/01/2023)  for  follow up .   Kerby Nora, MD

## 2023-01-18 NOTE — Assessment & Plan Note (Signed)
Chronic, some mild decline over time despite Aricept 10 mg daily.  We did discuss increasing Aricept to 23 mg daily although I am hesitant given it may affect her appetite.  We also discussed possible addition of Namenda.  They will discuss.  Of note patient would not like any further medication at this time.

## 2023-01-18 NOTE — Assessment & Plan Note (Signed)
Chronic, recent further decline in chronic kidney function likely secondary to dehydration.  She is now improving her fluid intake and is status post IV fluids. Normal urine output per family members.  Encouraged family to continue following up with renal for management.

## 2023-01-18 NOTE — Assessment & Plan Note (Signed)
Acute, nonspecific weakness and malaise leading to ER visit now improved slightly.  No true urinary tract infection given urine culture returned normal.  They have completed antibiotics. Weakness may have been due to decreased p.o. and water intake.

## 2023-01-18 NOTE — Assessment & Plan Note (Signed)
Chronic, blood pressure usually very well-controlled on no medication.  More recently low normal suggesting dehydration.  In the emergency room blood pressure was elevated.  Family has been checking blood pressure and only administering amlodipine 5 mg daily if it is above 150/90.  I encouraged them to continue to wafortch for hypertension.

## 2023-01-18 NOTE — Assessment & Plan Note (Addendum)
Chronic, previously tolerable control on no medication.  We did discuss how blood sugar elevations after meals could be contributing to her dehydration.  They will continue to work on avoiding concentrated sweets while still encouraging her nutritional intake.  We did discuss Glucotrol XL and possible hypoglycemia associated.  We will remove this from her medication list and I have encouraged them to not use it given possible associated hypoglycemia and weakness.  If blood sugars at home continue to be elevated after meals we will discuss new medication to treat.

## 2023-01-19 LAB — URINE CULTURE: Culture: 60000 — AB

## 2023-01-20 ENCOUNTER — Telehealth (HOSPITAL_BASED_OUTPATIENT_CLINIC_OR_DEPARTMENT_OTHER): Payer: Self-pay | Admitting: Emergency Medicine

## 2023-01-20 ENCOUNTER — Telehealth: Payer: Self-pay

## 2023-01-20 NOTE — Telephone Encounter (Signed)
Post ED Visit - Positive Culture Follow-up: Unsuccessful Patient Follow-up  Culture assessed and recommendations reviewed by:  []  Enzo Bi, Pharm.D. []  Celedonio Miyamoto, Pharm.D., BCPS AQ-ID []  Garvin Fila, Pharm.D., BCPS []  Georgina Pillion, Pharm.D., BCPS []  [place], [address].D., BCPS, AAHIVP []  Estella Husk, Pharm.D., BCPS, AAHIVP [x]  Elaina Hoops, PharmD []  Pollyann Samples, PharmD, BCPS  Positive urine culture  []  Patient discharged without antimicrobial prescription and treatment is now indicated []  Organism is resistant to prescribed ED discharge antimicrobial []  Patient with positive blood cultures   Unable to contact patient by phone. Left voicemail to return call, letter will be sent to address on file  Plan: Extend Levofloxacin to five days total Add:  Levofloxacin 250 mg PO once daily for three more days - Pricilla Loveless MD   Pamala Hurry 01/20/2023, 5:28 PM

## 2023-01-20 NOTE — Progress Notes (Signed)
ED Antimicrobial Stewardship Positive Culture Follow Up   Jill [name] is an 89 y.o. female who presented to [facility] on 01/16/2023 with a chief complaint of No chief complaint on file.   Recent Results (from the past 720 hour(s))  Urine Culture     Status: Abnormal   Collection Time: 01/16/23  3:05 PM   Specimen: Urine, Clean Catch  Result Value Ref Range Status   Specimen Description   Final    URINE, CLEAN CATCH Performed at [facility], [address] [place], [place] [address]    Special Requests   Final    NONE Performed at [facility], [address], [place], [place] [address]    Culture 60,000 COLONIES/mL ESCHERICHIA COLI (A)  Final   Report Status 01/19/2023 FINAL  Final   Organism ID, Bacteria ESCHERICHIA COLI (A)  Final      Susceptibility   Escherichia coli - MIC*    AMPICILLIN 16 INTERMEDIATE Intermediate     CEFAZOLIN <=4 SENSITIVE Sensitive     CEFEPIME <=0.12 SENSITIVE Sensitive     CEFTRIAXONE <=0.25 SENSITIVE Sensitive     CIPROFLOXACIN <=0.25 SENSITIVE Sensitive     GENTAMICIN <=1 SENSITIVE Sensitive     IMIPENEM <=0.25 SENSITIVE Sensitive     NITROFURANTOIN <=16 SENSITIVE Sensitive     TRIMETH/SULFA <=20 SENSITIVE Sensitive     AMPICILLIN/SULBACTAM 4 SENSITIVE Sensitive     PIP/TAZO <=4 SENSITIVE Sensitive     * 60,000 COLONIES/mL ESCHERICHIA COLI    Patient received only a short course of levofloxacin 250mg  once daily x 2 days, will extend course to 5 days as urine culture was positive for low colony e. coli and MD opts to treat. E coli sensitive to levofloxacin. Patient not a candidate for bactrim, cephalosporins, or macrobid due to allergies and renal function.   New antibiotic prescription: Levofloxacin 250mg  PO x 3 more days to complete 5 day course  ED Provider: Pricilla Loveless, MD    [facility] 01/20/2023, 10:54 AM Clinical Pharmacist Monday - Friday phone -  [phone] Saturday - Sunday  phone - [phone]

## 2023-01-20 NOTE — Telephone Encounter (Signed)
Transition Care Management Unsuccessful Follow-up Telephone Call  Date of discharge and from where:  [facility] 5/27  Attempts:  1st Attempt  Reason for unsuccessful TCM follow-up call:  Left voice message   Lenard Forth [facility] Guide, [facility] [phone] [address] [place], [place], [place] [address] Phone: [phone] Email: [email or web address]

## 2023-01-24 ENCOUNTER — Telehealth: Payer: Self-pay

## 2023-01-24 NOTE — Telephone Encounter (Signed)
Transition Care Management Unsuccessful Follow-up Telephone Call  Date of discharge and from where:  5/27 [facility]   Attempts:  2nd Attempt  Reason for unsuccessful TCM follow-up call:  Left voice message   Lenard Forth [facility] Guide, [facility] [phone] [address] [place], [place], [place] [address] Phone: [phone] Email: [email or web address]

## 2023-03-01 ENCOUNTER — Ambulatory Visit: Payer: Medicare PPO | Admitting: Family Medicine

## 2023-03-10 ENCOUNTER — Other Ambulatory Visit: Payer: Self-pay | Admitting: Family Medicine

## 2023-03-14 ENCOUNTER — Ambulatory Visit: Payer: Medicare PPO | Admitting: Family Medicine

## 2023-04-05 ENCOUNTER — Other Ambulatory Visit: Payer: Self-pay | Admitting: Family Medicine

## 2023-04-16 ENCOUNTER — Other Ambulatory Visit: Payer: Self-pay | Admitting: Family Medicine

## 2023-05-02 ENCOUNTER — Encounter: Payer: Self-pay | Admitting: Internal Medicine

## 2023-05-02 ENCOUNTER — Encounter: Payer: Self-pay | Admitting: Family Medicine

## 2023-05-04 ENCOUNTER — Telehealth: Payer: Self-pay | Admitting: Family Medicine

## 2023-05-04 NOTE — Telephone Encounter (Signed)
Dr. Ermalene Searing is out of the office.  Will see if Dr. Leoni Lager is able to sign this for her.

## 2023-05-04 NOTE — Telephone Encounter (Signed)
Funeral home notified by telephone that death certificate has been completed.

## 2023-05-04 NOTE — Telephone Encounter (Signed)
Jill [name] from [facility] called in stated she was trying to see if Jill [name] death certificate was sign and needed to be sign by today if any questions she can be reached AT [phone]

## 2023-05-04 NOTE — Telephone Encounter (Addendum)
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

## 2023-05-04 NOTE — Telephone Encounter (Signed)
I certified her death certificate.

## 2023-05-23 DEATH — deceased
